# Patient Record
Sex: Female | Born: 1960 | Race: White | Hispanic: No | Marital: Married | Smoking: Former smoker
Health system: Southern US, Community
[De-identification: ages and names within clinical notes are randomized; demographics above are authoritative.]

## PROBLEM LIST (undated history)

## (undated) DIAGNOSIS — Z0000 Encounter for general adult medical examination without abnormal findings: Secondary | ICD-10-CM

## (undated) DIAGNOSIS — J45901 Unspecified asthma with (acute) exacerbation: Secondary | ICD-10-CM

## (undated) DIAGNOSIS — N200 Calculus of kidney: Secondary | ICD-10-CM

## (undated) DIAGNOSIS — J309 Allergic rhinitis, unspecified: Secondary | ICD-10-CM

## (undated) DIAGNOSIS — Z87442 Personal history of urinary calculi: Secondary | ICD-10-CM

## (undated) DIAGNOSIS — F17200 Nicotine dependence, unspecified, uncomplicated: Secondary | ICD-10-CM

## (undated) DIAGNOSIS — F411 Generalized anxiety disorder: Secondary | ICD-10-CM

## (undated) DIAGNOSIS — Z973 Presence of spectacles and contact lenses: Secondary | ICD-10-CM

## (undated) DIAGNOSIS — E785 Hyperlipidemia, unspecified: Secondary | ICD-10-CM

## (undated) DIAGNOSIS — R2689 Other abnormalities of gait and mobility: Secondary | ICD-10-CM

## (undated) DIAGNOSIS — J189 Pneumonia, unspecified organism: Secondary | ICD-10-CM

## (undated) DIAGNOSIS — Z1231 Encounter for screening mammogram for malignant neoplasm of breast: Secondary | ICD-10-CM

## (undated) DIAGNOSIS — G4730 Sleep apnea, unspecified: Secondary | ICD-10-CM

## (undated) DIAGNOSIS — M6281 Muscle weakness (generalized): Secondary | ICD-10-CM

## (undated) DIAGNOSIS — M1990 Unspecified osteoarthritis, unspecified site: Secondary | ICD-10-CM

## (undated) DIAGNOSIS — Z01419 Encounter for gynecological examination (general) (routine) without abnormal findings: Secondary | ICD-10-CM

## (undated) DIAGNOSIS — W19XXXD Unspecified fall, subsequent encounter: Secondary | ICD-10-CM

## (undated) DIAGNOSIS — F17210 Nicotine dependence, cigarettes, uncomplicated: Secondary | ICD-10-CM

## (undated) DIAGNOSIS — F3289 Other specified depressive episodes: Secondary | ICD-10-CM

## (undated) DIAGNOSIS — K588 Other irritable bowel syndrome: Secondary | ICD-10-CM

## (undated) DIAGNOSIS — K219 Gastro-esophageal reflux disease without esophagitis: Secondary | ICD-10-CM

## (undated) DIAGNOSIS — R262 Difficulty in walking, not elsewhere classified: Secondary | ICD-10-CM

## (undated) DIAGNOSIS — S72142A Displaced intertrochanteric fracture of left femur, initial encounter for closed fracture: Secondary | ICD-10-CM

## (undated) DIAGNOSIS — R011 Cardiac murmur, unspecified: Secondary | ICD-10-CM

## (undated) DIAGNOSIS — M549 Dorsalgia, unspecified: Secondary | ICD-10-CM

## (undated) DIAGNOSIS — G5601 Carpal tunnel syndrome, right upper limb: Secondary | ICD-10-CM

## (undated) DIAGNOSIS — M797 Fibromyalgia: Secondary | ICD-10-CM

## (undated) DIAGNOSIS — Z87448 Personal history of other diseases of urinary system: Secondary | ICD-10-CM

## (undated) DIAGNOSIS — D649 Anemia, unspecified: Secondary | ICD-10-CM

## (undated) DIAGNOSIS — R05 Cough: Secondary | ICD-10-CM

## (undated) DIAGNOSIS — N390 Urinary tract infection, site not specified: Secondary | ICD-10-CM

## (undated) DIAGNOSIS — N760 Acute vaginitis: Secondary | ICD-10-CM

## (undated) DIAGNOSIS — I10 Essential (primary) hypertension: Secondary | ICD-10-CM

## (undated) DIAGNOSIS — I509 Heart failure, unspecified: Secondary | ICD-10-CM

## (undated) DIAGNOSIS — E119 Type 2 diabetes mellitus without complications: Secondary | ICD-10-CM

## (undated) DIAGNOSIS — K5900 Constipation, unspecified: Secondary | ICD-10-CM

## (undated) DIAGNOSIS — R059 Cough, unspecified: Secondary | ICD-10-CM

## (undated) DIAGNOSIS — F329 Major depressive disorder, single episode, unspecified: Secondary | ICD-10-CM

## (undated) DIAGNOSIS — J449 Chronic obstructive pulmonary disease, unspecified: Secondary | ICD-10-CM

## (undated) DIAGNOSIS — R062 Wheezing: Secondary | ICD-10-CM

## (undated) DIAGNOSIS — L89159 Pressure ulcer of sacral region, unspecified stage: Secondary | ICD-10-CM

## (undated) DIAGNOSIS — G894 Chronic pain syndrome: Secondary | ICD-10-CM

## (undated) HISTORY — PX: BACK SURGERY: SHX140

## (undated) HISTORY — DX: Generalized anxiety disorder: F41.1

## (undated) HISTORY — DX: Hyperlipidemia, unspecified: E78.5

## (undated) HISTORY — DX: Nicotine dependence, unspecified, uncomplicated: F17.200

## (undated) HISTORY — DX: Encounter for gynecological examination (general) (routine) without abnormal findings: Z01.419

## (undated) HISTORY — DX: Sleep apnea, unspecified: G47.30

## (undated) HISTORY — DX: Fibromyalgia: M79.7

## (undated) HISTORY — DX: Dorsalgia, unspecified: M54.9

## (undated) HISTORY — DX: Unspecified asthma with (acute) exacerbation: J45.901

## (undated) HISTORY — DX: Type 2 diabetes mellitus without complications: E11.9

## (undated) HISTORY — PX: COLONOSCOPY: SHX174

## (undated) HISTORY — DX: Encounter for general adult medical examination without abnormal findings: Z00.00

## (undated) HISTORY — DX: Gastro-esophageal reflux disease without esophagitis: K21.9

## (undated) HISTORY — DX: Encounter for screening mammogram for malignant neoplasm of breast: Z12.31

## (undated) HISTORY — DX: Other specified depressive episodes: F32.89

## (undated) HISTORY — DX: Unspecified osteoarthritis, unspecified site: M19.90

## (undated) HISTORY — DX: Personal history of other diseases of urinary system: Z87.448

## (undated) HISTORY — DX: Calculus of kidney: N20.0

## (undated) HISTORY — DX: Allergic rhinitis, unspecified: J30.9

## (undated) HISTORY — DX: Major depressive disorder, single episode, unspecified: F32.9

## (undated) HISTORY — DX: Cardiac murmur, unspecified: R01.1

## (undated) HISTORY — DX: Essential (primary) hypertension: I10

---
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
## 1998-01-21 ENCOUNTER — Inpatient Hospital Stay (HOSPITAL_COMMUNITY): Admission: AD | Admit: 1998-01-21 | Discharge: 1998-01-21 | Payer: Self-pay | Admitting: Obstetrics & Gynecology

## 1998-02-15 ENCOUNTER — Other Ambulatory Visit: Admission: RE | Admit: 1998-02-15 | Discharge: 1998-02-15 | Payer: Self-pay | Admitting: Obstetrics & Gynecology

## 1998-02-15 ENCOUNTER — Encounter: Admission: RE | Admit: 1998-02-15 | Discharge: 1998-02-15 | Payer: Self-pay | Admitting: Obstetrics & Gynecology

## 1998-03-04 ENCOUNTER — Encounter: Admission: RE | Admit: 1998-03-04 | Discharge: 1998-03-04 | Payer: Self-pay | Admitting: Obstetrics & Gynecology

## 1998-05-17 ENCOUNTER — Emergency Department (HOSPITAL_COMMUNITY): Admission: EM | Admit: 1998-05-17 | Discharge: 1998-05-18 | Payer: Self-pay | Admitting: Internal Medicine

## 1998-06-02 ENCOUNTER — Encounter: Admission: RE | Admit: 1998-06-02 | Discharge: 1998-06-02 | Payer: Self-pay | Admitting: Internal Medicine

## 1998-06-07 ENCOUNTER — Encounter: Admission: RE | Admit: 1998-06-07 | Discharge: 1998-06-07 | Payer: Self-pay | Admitting: Hematology and Oncology

## 1998-07-06 ENCOUNTER — Encounter: Admission: RE | Admit: 1998-07-06 | Discharge: 1998-07-06 | Payer: Self-pay | Admitting: Internal Medicine

## 1998-08-09 ENCOUNTER — Encounter: Admission: RE | Admit: 1998-08-09 | Discharge: 1998-08-09 | Payer: Self-pay | Admitting: Internal Medicine

## 1998-08-09 ENCOUNTER — Ambulatory Visit (HOSPITAL_COMMUNITY): Admission: RE | Admit: 1998-08-09 | Discharge: 1998-08-09 | Payer: Self-pay | Admitting: Internal Medicine

## 1998-11-06 ENCOUNTER — Emergency Department (HOSPITAL_COMMUNITY): Admission: EM | Admit: 1998-11-06 | Discharge: 1998-11-06 | Payer: Self-pay

## 1998-12-27 ENCOUNTER — Encounter: Admission: RE | Admit: 1998-12-27 | Discharge: 1998-12-27 | Payer: Self-pay | Admitting: Internal Medicine

## 1999-01-07 ENCOUNTER — Emergency Department (HOSPITAL_COMMUNITY): Admission: EM | Admit: 1999-01-07 | Discharge: 1999-01-07 | Payer: Self-pay

## 1999-01-13 ENCOUNTER — Ambulatory Visit (HOSPITAL_COMMUNITY): Admission: RE | Admit: 1999-01-13 | Discharge: 1999-01-13 | Payer: Self-pay | Admitting: Emergency Medicine

## 1999-01-13 ENCOUNTER — Encounter: Payer: Self-pay | Admitting: Emergency Medicine

## 1999-01-16 ENCOUNTER — Emergency Department (HOSPITAL_COMMUNITY): Admission: EM | Admit: 1999-01-16 | Discharge: 1999-01-17 | Payer: Self-pay | Admitting: Emergency Medicine

## 1999-02-07 ENCOUNTER — Encounter: Payer: Self-pay | Admitting: Emergency Medicine

## 1999-02-07 ENCOUNTER — Emergency Department (HOSPITAL_COMMUNITY): Admission: EM | Admit: 1999-02-07 | Discharge: 1999-02-07 | Payer: Self-pay | Admitting: Emergency Medicine

## 1999-02-17 ENCOUNTER — Encounter: Admission: RE | Admit: 1999-02-17 | Discharge: 1999-02-17 | Payer: Self-pay | Admitting: Internal Medicine

## 1999-04-04 ENCOUNTER — Encounter: Admission: RE | Admit: 1999-04-04 | Discharge: 1999-04-04 | Payer: Self-pay | Admitting: Internal Medicine

## 1999-07-12 ENCOUNTER — Inpatient Hospital Stay (HOSPITAL_COMMUNITY): Admission: AD | Admit: 1999-07-12 | Discharge: 1999-07-12 | Payer: Self-pay | Admitting: Obstetrics & Gynecology

## 1999-07-14 ENCOUNTER — Encounter: Payer: Self-pay | Admitting: Obstetrics & Gynecology

## 1999-07-14 ENCOUNTER — Observation Stay (HOSPITAL_COMMUNITY): Admission: AD | Admit: 1999-07-14 | Discharge: 1999-07-15 | Payer: Self-pay | Admitting: Obstetrics & Gynecology

## 1999-07-17 ENCOUNTER — Inpatient Hospital Stay (HOSPITAL_COMMUNITY): Admission: AD | Admit: 1999-07-17 | Discharge: 1999-07-17 | Payer: Self-pay | Admitting: *Deleted

## 1999-07-22 ENCOUNTER — Inpatient Hospital Stay (HOSPITAL_COMMUNITY): Admission: AD | Admit: 1999-07-22 | Discharge: 1999-07-22 | Payer: Self-pay | Admitting: *Deleted

## 1999-08-13 ENCOUNTER — Inpatient Hospital Stay (HOSPITAL_COMMUNITY): Admission: AD | Admit: 1999-08-13 | Discharge: 1999-08-13 | Payer: Self-pay | Admitting: Obstetrics & Gynecology

## 1999-09-30 ENCOUNTER — Emergency Department (HOSPITAL_COMMUNITY): Admission: EM | Admit: 1999-09-30 | Discharge: 1999-09-30 | Payer: Self-pay | Admitting: Emergency Medicine

## 1999-10-26 ENCOUNTER — Encounter: Admission: RE | Admit: 1999-10-26 | Discharge: 1999-10-26 | Payer: Self-pay | Admitting: Internal Medicine

## 1999-11-19 ENCOUNTER — Emergency Department (HOSPITAL_COMMUNITY): Admission: EM | Admit: 1999-11-19 | Discharge: 1999-11-19 | Payer: Self-pay | Admitting: Emergency Medicine

## 1999-12-25 ENCOUNTER — Emergency Department (HOSPITAL_COMMUNITY): Admission: EM | Admit: 1999-12-25 | Discharge: 1999-12-25 | Payer: Self-pay | Admitting: Emergency Medicine

## 2000-01-04 ENCOUNTER — Encounter: Admission: RE | Admit: 2000-01-04 | Discharge: 2000-01-04 | Payer: Self-pay | Admitting: Hematology and Oncology

## 2000-07-04 ENCOUNTER — Encounter: Payer: Self-pay | Admitting: Family Medicine

## 2000-07-04 ENCOUNTER — Encounter: Admission: RE | Admit: 2000-07-04 | Discharge: 2000-07-04 | Payer: Self-pay | Admitting: *Deleted

## 2000-10-04 ENCOUNTER — Encounter: Admission: RE | Admit: 2000-10-04 | Discharge: 2000-10-04 | Payer: Self-pay | Admitting: Obstetrics & Gynecology

## 2000-10-16 ENCOUNTER — Encounter: Admission: RE | Admit: 2000-10-16 | Discharge: 2001-01-14 | Payer: Self-pay | Admitting: Family Medicine

## 2000-12-20 ENCOUNTER — Encounter: Admission: RE | Admit: 2000-12-20 | Discharge: 2000-12-20 | Payer: Self-pay

## 2001-01-09 ENCOUNTER — Encounter: Admission: RE | Admit: 2001-01-09 | Discharge: 2001-04-09 | Payer: Self-pay

## 2001-01-10 ENCOUNTER — Encounter: Admission: RE | Admit: 2001-01-10 | Discharge: 2001-01-10 | Payer: Self-pay | Admitting: Internal Medicine

## 2001-01-15 ENCOUNTER — Encounter: Admission: RE | Admit: 2001-01-15 | Discharge: 2001-01-30 | Payer: Self-pay | Admitting: Family Medicine

## 2001-01-20 ENCOUNTER — Encounter: Admission: RE | Admit: 2001-01-20 | Discharge: 2001-01-20 | Payer: Self-pay | Admitting: Internal Medicine

## 2001-02-18 ENCOUNTER — Encounter: Admission: RE | Admit: 2001-02-18 | Discharge: 2001-02-18 | Payer: Self-pay | Admitting: Hematology and Oncology

## 2001-03-20 ENCOUNTER — Encounter: Admission: RE | Admit: 2001-03-20 | Discharge: 2001-03-20 | Payer: Self-pay | Admitting: Obstetrics

## 2001-07-02 ENCOUNTER — Encounter: Admission: RE | Admit: 2001-07-02 | Discharge: 2001-07-02 | Payer: Self-pay | Admitting: Internal Medicine

## 2001-07-23 ENCOUNTER — Encounter: Admission: RE | Admit: 2001-07-23 | Discharge: 2001-07-23 | Payer: Self-pay | Admitting: Internal Medicine

## 2001-09-01 ENCOUNTER — Encounter: Admission: RE | Admit: 2001-09-01 | Discharge: 2001-09-01 | Payer: Self-pay | Admitting: Internal Medicine

## 2001-09-30 ENCOUNTER — Encounter: Admission: RE | Admit: 2001-09-30 | Discharge: 2001-09-30 | Payer: Self-pay | Admitting: Internal Medicine

## 2002-01-05 ENCOUNTER — Encounter: Admission: RE | Admit: 2002-01-05 | Discharge: 2002-01-05 | Payer: Self-pay | Admitting: Internal Medicine

## 2002-01-23 ENCOUNTER — Encounter: Admission: RE | Admit: 2002-01-23 | Discharge: 2002-01-23 | Payer: Self-pay | Admitting: Internal Medicine

## 2002-06-10 ENCOUNTER — Encounter: Admission: RE | Admit: 2002-06-10 | Discharge: 2002-06-10 | Payer: Self-pay | Admitting: Internal Medicine

## 2002-06-30 ENCOUNTER — Encounter: Admission: RE | Admit: 2002-06-30 | Discharge: 2002-06-30 | Payer: Self-pay | Admitting: *Deleted

## 2002-07-16 ENCOUNTER — Ambulatory Visit (HOSPITAL_COMMUNITY): Admission: RE | Admit: 2002-07-16 | Discharge: 2002-07-16 | Payer: Self-pay | Admitting: *Deleted

## 2002-07-20 ENCOUNTER — Encounter: Admission: RE | Admit: 2002-07-20 | Discharge: 2002-07-20 | Payer: Self-pay | Admitting: Internal Medicine

## 2002-07-23 ENCOUNTER — Encounter: Admission: RE | Admit: 2002-07-23 | Discharge: 2002-07-23 | Payer: Self-pay | Admitting: Internal Medicine

## 2002-08-04 ENCOUNTER — Encounter: Admission: RE | Admit: 2002-08-04 | Discharge: 2002-08-04 | Payer: Self-pay | Admitting: Internal Medicine

## 2002-10-07 ENCOUNTER — Encounter: Admission: RE | Admit: 2002-10-07 | Discharge: 2002-10-07 | Payer: Self-pay | Admitting: Internal Medicine

## 2002-11-09 ENCOUNTER — Encounter: Admission: RE | Admit: 2002-11-09 | Discharge: 2002-11-09 | Payer: Self-pay | Admitting: Internal Medicine

## 2003-02-22 ENCOUNTER — Encounter: Admission: RE | Admit: 2003-02-22 | Discharge: 2003-02-22 | Payer: Self-pay | Admitting: Internal Medicine

## 2003-02-25 ENCOUNTER — Encounter: Admission: RE | Admit: 2003-02-25 | Discharge: 2003-02-25 | Payer: Self-pay | Admitting: Internal Medicine

## 2003-03-08 ENCOUNTER — Encounter: Admission: RE | Admit: 2003-03-08 | Discharge: 2003-03-08 | Payer: Self-pay | Admitting: Internal Medicine

## 2003-03-15 ENCOUNTER — Encounter: Admission: RE | Admit: 2003-03-15 | Discharge: 2003-03-15 | Payer: Self-pay | Admitting: Internal Medicine

## 2003-05-19 ENCOUNTER — Encounter: Admission: RE | Admit: 2003-05-19 | Discharge: 2003-05-19 | Payer: Self-pay | Admitting: Internal Medicine

## 2003-07-05 ENCOUNTER — Encounter: Admission: RE | Admit: 2003-07-05 | Discharge: 2003-07-05 | Payer: Self-pay | Admitting: Internal Medicine

## 2003-08-20 ENCOUNTER — Encounter: Admission: RE | Admit: 2003-08-20 | Discharge: 2003-08-20 | Payer: Self-pay | Admitting: Internal Medicine

## 2003-09-09 ENCOUNTER — Encounter: Admission: RE | Admit: 2003-09-09 | Discharge: 2003-09-09 | Payer: Self-pay | Admitting: Internal Medicine

## 2003-09-23 ENCOUNTER — Encounter: Admission: RE | Admit: 2003-09-23 | Discharge: 2003-09-23 | Payer: Self-pay | Admitting: Internal Medicine

## 2003-11-15 ENCOUNTER — Encounter: Admission: RE | Admit: 2003-11-15 | Discharge: 2003-11-15 | Payer: Self-pay | Admitting: Internal Medicine

## 2003-12-06 ENCOUNTER — Encounter: Admission: RE | Admit: 2003-12-06 | Discharge: 2003-12-06 | Payer: Self-pay | Admitting: Internal Medicine

## 2004-01-14 ENCOUNTER — Encounter: Admission: RE | Admit: 2004-01-14 | Discharge: 2004-01-14 | Payer: Self-pay | Admitting: Internal Medicine

## 2004-02-23 ENCOUNTER — Encounter: Admission: RE | Admit: 2004-02-23 | Discharge: 2004-02-23 | Payer: Self-pay | Admitting: Internal Medicine

## 2004-04-06 ENCOUNTER — Ambulatory Visit (HOSPITAL_COMMUNITY): Admission: RE | Admit: 2004-04-06 | Discharge: 2004-04-06 | Payer: Self-pay | Admitting: Internal Medicine

## 2004-04-06 ENCOUNTER — Encounter: Admission: RE | Admit: 2004-04-06 | Discharge: 2004-04-06 | Payer: Self-pay | Admitting: Internal Medicine

## 2004-04-18 ENCOUNTER — Encounter (INDEPENDENT_AMBULATORY_CARE_PROVIDER_SITE_OTHER): Payer: Self-pay | Admitting: *Deleted

## 2004-04-18 ENCOUNTER — Encounter: Admission: RE | Admit: 2004-04-18 | Discharge: 2004-04-18 | Payer: Self-pay | Admitting: Obstetrics and Gynecology

## 2004-04-21 ENCOUNTER — Ambulatory Visit (HOSPITAL_COMMUNITY): Admission: RE | Admit: 2004-04-21 | Discharge: 2004-04-21 | Payer: Self-pay | Admitting: *Deleted

## 2004-05-01 ENCOUNTER — Encounter: Admission: RE | Admit: 2004-05-01 | Discharge: 2004-05-01 | Payer: Self-pay | Admitting: Internal Medicine

## 2004-05-04 ENCOUNTER — Ambulatory Visit (HOSPITAL_COMMUNITY): Admission: RE | Admit: 2004-05-04 | Discharge: 2004-05-04 | Payer: Self-pay | Admitting: Internal Medicine

## 2004-05-15 ENCOUNTER — Encounter (INDEPENDENT_AMBULATORY_CARE_PROVIDER_SITE_OTHER): Payer: Self-pay | Admitting: Specialist

## 2004-05-15 ENCOUNTER — Ambulatory Visit (HOSPITAL_COMMUNITY): Admission: RE | Admit: 2004-05-15 | Discharge: 2004-05-15 | Payer: Self-pay | Admitting: Obstetrics and Gynecology

## 2004-06-12 ENCOUNTER — Encounter: Admission: RE | Admit: 2004-06-12 | Discharge: 2004-06-12 | Payer: Self-pay | Admitting: Internal Medicine

## 2004-12-27 ENCOUNTER — Ambulatory Visit: Payer: Self-pay | Admitting: Internal Medicine

## 2005-02-23 ENCOUNTER — Ambulatory Visit: Payer: Self-pay | Admitting: Internal Medicine

## 2005-03-05 ENCOUNTER — Ambulatory Visit: Payer: Self-pay | Admitting: Internal Medicine

## 2005-04-09 ENCOUNTER — Ambulatory Visit: Payer: Self-pay | Admitting: Internal Medicine

## 2005-05-23 ENCOUNTER — Ambulatory Visit: Payer: Self-pay | Admitting: Internal Medicine

## 2005-07-11 ENCOUNTER — Ambulatory Visit: Payer: Self-pay | Admitting: Internal Medicine

## 2005-08-09 ENCOUNTER — Ambulatory Visit: Payer: Self-pay | Admitting: Hospitalist

## 2005-10-01 ENCOUNTER — Ambulatory Visit: Payer: Self-pay | Admitting: Internal Medicine

## 2005-10-16 ENCOUNTER — Ambulatory Visit: Payer: Self-pay | Admitting: Internal Medicine

## 2005-10-25 ENCOUNTER — Ambulatory Visit (HOSPITAL_COMMUNITY): Admission: RE | Admit: 2005-10-25 | Discharge: 2005-10-25 | Payer: Self-pay | Admitting: Internal Medicine

## 2005-11-08 ENCOUNTER — Ambulatory Visit: Payer: Self-pay | Admitting: Internal Medicine

## 2006-03-13 ENCOUNTER — Ambulatory Visit: Payer: Self-pay | Admitting: Internal Medicine

## 2006-03-26 ENCOUNTER — Ambulatory Visit: Payer: Self-pay | Admitting: Internal Medicine

## 2006-05-21 ENCOUNTER — Ambulatory Visit: Payer: Self-pay | Admitting: Internal Medicine

## 2006-06-04 ENCOUNTER — Ambulatory Visit: Payer: Self-pay | Admitting: Hospitalist

## 2006-07-06 ENCOUNTER — Encounter: Payer: Self-pay | Admitting: Family Medicine

## 2006-07-06 LAB — CONVERTED CEMR LAB: Pap Smear: NORMAL

## 2006-07-16 ENCOUNTER — Encounter (INDEPENDENT_AMBULATORY_CARE_PROVIDER_SITE_OTHER): Payer: Self-pay | Admitting: *Deleted

## 2006-07-16 ENCOUNTER — Ambulatory Visit: Payer: Self-pay | Admitting: Internal Medicine

## 2006-07-19 ENCOUNTER — Ambulatory Visit: Payer: Self-pay | Admitting: Internal Medicine

## 2006-08-05 ENCOUNTER — Encounter: Payer: Self-pay | Admitting: Family Medicine

## 2006-08-05 HISTORY — PX: CHOLECYSTECTOMY: SHX55

## 2006-08-05 LAB — CONVERTED CEMR LAB: Microalbumin U total vol: 0.7 mg/L

## 2006-08-06 ENCOUNTER — Ambulatory Visit: Payer: Self-pay | Admitting: Internal Medicine

## 2006-08-09 ENCOUNTER — Encounter (INDEPENDENT_AMBULATORY_CARE_PROVIDER_SITE_OTHER): Payer: Self-pay | Admitting: Specialist

## 2006-08-09 ENCOUNTER — Ambulatory Visit (HOSPITAL_COMMUNITY): Admission: RE | Admit: 2006-08-09 | Discharge: 2006-08-09 | Payer: Self-pay | Admitting: General Surgery

## 2006-08-26 ENCOUNTER — Ambulatory Visit: Payer: Self-pay | Admitting: Internal Medicine

## 2006-09-05 ENCOUNTER — Ambulatory Visit: Payer: Self-pay | Admitting: Internal Medicine

## 2006-09-05 ENCOUNTER — Ambulatory Visit (HOSPITAL_COMMUNITY): Admission: RE | Admit: 2006-09-05 | Discharge: 2006-09-05 | Payer: Self-pay | Admitting: Internal Medicine

## 2006-09-10 ENCOUNTER — Ambulatory Visit: Payer: Self-pay | Admitting: Internal Medicine

## 2006-10-08 ENCOUNTER — Ambulatory Visit: Payer: Self-pay | Admitting: Internal Medicine

## 2006-10-08 LAB — CONVERTED CEMR LAB
BUN: 14 mg/dL (ref 6–23)
Calcium: 9.3 mg/dL (ref 8.4–10.5)
Chloride: 101 meq/L (ref 96–112)
Cholesterol: 129 mg/dL (ref 0–200)
Glucose, Bld: 241 mg/dL — ABNORMAL HIGH (ref 70–99)
LDL Cholesterol: 72 mg/dL (ref 0–99)
Potassium: 4.2 meq/L (ref 3.5–5.1)
Sodium: 136 meq/L (ref 135–145)
Triglyceride fasting, serum: 105 mg/dL (ref 0–149)

## 2006-10-11 ENCOUNTER — Ambulatory Visit: Payer: Self-pay | Admitting: Endocrinology

## 2006-11-22 ENCOUNTER — Ambulatory Visit: Payer: Self-pay | Admitting: Internal Medicine

## 2006-11-26 ENCOUNTER — Ambulatory Visit: Payer: Self-pay | Admitting: Family Medicine

## 2006-12-06 ENCOUNTER — Encounter: Payer: Self-pay | Admitting: Family Medicine

## 2006-12-06 LAB — CONVERTED CEMR LAB: Hgb A1c MFr Bld: 9.2 %

## 2006-12-11 ENCOUNTER — Encounter: Payer: Self-pay | Admitting: Family Medicine

## 2006-12-13 ENCOUNTER — Ambulatory Visit: Payer: Self-pay | Admitting: Family Medicine

## 2006-12-16 ENCOUNTER — Ambulatory Visit: Payer: Self-pay | Admitting: Family Medicine

## 2006-12-31 ENCOUNTER — Ambulatory Visit: Payer: Self-pay | Admitting: Family Medicine

## 2007-03-12 ENCOUNTER — Ambulatory Visit: Payer: Self-pay | Admitting: Family Medicine

## 2007-03-12 DIAGNOSIS — Z87891 Personal history of nicotine dependence: Secondary | ICD-10-CM

## 2007-03-12 DIAGNOSIS — G4730 Sleep apnea, unspecified: Secondary | ICD-10-CM

## 2007-03-12 DIAGNOSIS — M797 Fibromyalgia: Secondary | ICD-10-CM

## 2007-03-12 DIAGNOSIS — F339 Major depressive disorder, recurrent, unspecified: Secondary | ICD-10-CM

## 2007-03-12 DIAGNOSIS — K219 Gastro-esophageal reflux disease without esophagitis: Secondary | ICD-10-CM

## 2007-03-12 DIAGNOSIS — E785 Hyperlipidemia, unspecified: Secondary | ICD-10-CM

## 2007-03-26 ENCOUNTER — Telehealth: Payer: Self-pay | Admitting: Family Medicine

## 2007-04-01 ENCOUNTER — Telehealth: Payer: Self-pay | Admitting: Family Medicine

## 2007-04-03 ENCOUNTER — Ambulatory Visit: Payer: Self-pay | Admitting: Family Medicine

## 2007-04-03 DIAGNOSIS — F411 Generalized anxiety disorder: Secondary | ICD-10-CM | POA: Insufficient documentation

## 2007-04-07 ENCOUNTER — Ambulatory Visit: Payer: Self-pay | Admitting: Family Medicine

## 2007-04-07 LAB — CONVERTED CEMR LAB
Albumin: 3.3 g/dL — ABNORMAL LOW (ref 3.5–5.2)
Alkaline Phosphatase: 155 units/L — ABNORMAL HIGH (ref 39–117)
Bilirubin, Direct: 0.1 mg/dL (ref 0.0–0.3)
CO2: 30 meq/L (ref 19–32)
Calcium: 9.2 mg/dL (ref 8.4–10.5)
Creatinine, Ser: 0.7 mg/dL (ref 0.4–1.2)
GFR calc non Af Amer: 96 mL/min
Glucose, Bld: 96 mg/dL (ref 70–99)
Hgb A1c MFr Bld: 6.9 % — ABNORMAL HIGH (ref 4.6–6.0)
LDL Cholesterol: 98 mg/dL (ref 0–99)
Total Bilirubin: 0.4 mg/dL (ref 0.3–1.2)
Total CHOL/HDL Ratio: 4.1
Total Protein: 7.2 g/dL (ref 6.0–8.3)
VLDL: 13 mg/dL (ref 0–40)

## 2007-05-20 ENCOUNTER — Telehealth (INDEPENDENT_AMBULATORY_CARE_PROVIDER_SITE_OTHER): Payer: Self-pay | Admitting: *Deleted

## 2007-05-30 ENCOUNTER — Telehealth (INDEPENDENT_AMBULATORY_CARE_PROVIDER_SITE_OTHER): Payer: Self-pay | Admitting: *Deleted

## 2007-06-24 ENCOUNTER — Encounter: Payer: Self-pay | Admitting: Family Medicine

## 2007-06-24 DIAGNOSIS — E119 Type 2 diabetes mellitus without complications: Secondary | ICD-10-CM

## 2007-06-24 DIAGNOSIS — J309 Allergic rhinitis, unspecified: Secondary | ICD-10-CM

## 2007-06-24 DIAGNOSIS — N200 Calculus of kidney: Secondary | ICD-10-CM | POA: Insufficient documentation

## 2007-06-24 DIAGNOSIS — M1990 Unspecified osteoarthritis, unspecified site: Secondary | ICD-10-CM | POA: Insufficient documentation

## 2007-06-30 ENCOUNTER — Encounter (INDEPENDENT_AMBULATORY_CARE_PROVIDER_SITE_OTHER): Payer: Self-pay | Admitting: *Deleted

## 2007-07-08 ENCOUNTER — Ambulatory Visit: Payer: Self-pay | Admitting: Family Medicine

## 2007-07-08 LAB — CONVERTED CEMR LAB
Creatinine,U: 60.6 mg/dL
Hgb A1c MFr Bld: 6.8 % — ABNORMAL HIGH (ref 4.6–6.0)

## 2007-07-14 ENCOUNTER — Telehealth: Payer: Self-pay | Admitting: Family Medicine

## 2007-07-15 ENCOUNTER — Ambulatory Visit: Payer: Self-pay | Admitting: Family Medicine

## 2007-07-15 LAB — CONVERTED CEMR LAB
HDL goal, serum: 40 mg/dL
Ketones, urine, test strip: NEGATIVE
Nitrite: NEGATIVE
Specific Gravity, Urine: 1.015
Urobilinogen, UA: NEGATIVE
WBC Urine, dipstick: NEGATIVE
pH: 6

## 2007-07-23 ENCOUNTER — Telehealth: Payer: Self-pay | Admitting: Family Medicine

## 2007-07-30 ENCOUNTER — Telehealth: Payer: Self-pay | Admitting: Family Medicine

## 2007-08-28 ENCOUNTER — Telehealth: Payer: Self-pay | Admitting: Family Medicine

## 2007-09-08 ENCOUNTER — Telehealth: Payer: Self-pay | Admitting: Family Medicine

## 2007-09-09 ENCOUNTER — Telehealth: Payer: Self-pay | Admitting: Family Medicine

## 2007-09-19 ENCOUNTER — Ambulatory Visit: Payer: Self-pay | Admitting: Family Medicine

## 2007-10-14 ENCOUNTER — Ambulatory Visit: Payer: Self-pay | Admitting: Family Medicine

## 2007-10-14 LAB — CONVERTED CEMR LAB
BUN: 12 mg/dL (ref 6–23)
Creatinine, Ser: 0.8 mg/dL (ref 0.4–1.2)
Glucose, Bld: 199 mg/dL — ABNORMAL HIGH (ref 70–99)
Hgb A1c MFr Bld: 8.8 % — ABNORMAL HIGH (ref 4.6–6.0)
Potassium: 4.5 meq/L (ref 3.5–5.1)
Sodium: 131 meq/L — ABNORMAL LOW (ref 135–145)

## 2007-10-20 ENCOUNTER — Telehealth (INDEPENDENT_AMBULATORY_CARE_PROVIDER_SITE_OTHER): Payer: Self-pay | Admitting: *Deleted

## 2007-10-21 ENCOUNTER — Telehealth: Payer: Self-pay | Admitting: Family Medicine

## 2007-10-23 ENCOUNTER — Ambulatory Visit: Payer: Self-pay | Admitting: Family Medicine

## 2007-11-07 ENCOUNTER — Telehealth: Payer: Self-pay | Admitting: Family Medicine

## 2007-11-11 ENCOUNTER — Telehealth: Payer: Self-pay | Admitting: Family Medicine

## 2007-11-21 ENCOUNTER — Telehealth: Payer: Self-pay | Admitting: Family Medicine

## 2007-12-11 ENCOUNTER — Telehealth: Payer: Self-pay | Admitting: Family Medicine

## 2007-12-23 ENCOUNTER — Telehealth: Payer: Self-pay | Admitting: Family Medicine

## 2007-12-31 ENCOUNTER — Ambulatory Visit: Payer: Self-pay | Admitting: Family Medicine

## 2007-12-31 LAB — CONVERTED CEMR LAB: KOH Prep: POSITIVE

## 2008-01-07 ENCOUNTER — Telehealth: Payer: Self-pay | Admitting: Family Medicine

## 2008-01-19 ENCOUNTER — Telehealth: Payer: Self-pay | Admitting: Family Medicine

## 2008-02-02 ENCOUNTER — Encounter (INDEPENDENT_AMBULATORY_CARE_PROVIDER_SITE_OTHER): Payer: Self-pay | Admitting: *Deleted

## 2008-02-06 ENCOUNTER — Telehealth: Payer: Self-pay | Admitting: Family Medicine

## 2008-02-18 ENCOUNTER — Telehealth: Payer: Self-pay | Admitting: Family Medicine

## 2008-02-24 ENCOUNTER — Ambulatory Visit: Payer: Self-pay | Admitting: Family Medicine

## 2008-03-02 ENCOUNTER — Ambulatory Visit: Payer: Self-pay | Admitting: Family Medicine

## 2008-03-05 ENCOUNTER — Encounter (INDEPENDENT_AMBULATORY_CARE_PROVIDER_SITE_OTHER): Payer: Self-pay | Admitting: *Deleted

## 2008-03-05 LAB — CONVERTED CEMR LAB
ALT: 25 units/L (ref 0–35)
Alkaline Phosphatase: 166 units/L — ABNORMAL HIGH (ref 39–117)
BUN: 11 mg/dL (ref 6–23)
Bilirubin, Direct: 0.1 mg/dL (ref 0.0–0.3)
Calcium: 9.6 mg/dL (ref 8.4–10.5)
GFR calc non Af Amer: 95 mL/min
HDL: 29.3 mg/dL — ABNORMAL LOW (ref >39.0)
Hgb A1c MFr Bld: 9.2 % — ABNORMAL HIGH (ref 4.6–6.0)
LDL Cholesterol: 78 mg/dL (ref 0–99)
Sodium: 137 meq/L (ref 135–145)
Total Bilirubin: 0.3 mg/dL (ref 0.3–1.2)
Triglycerides: 146 mg/dL (ref 0–149)
VLDL: 29 mg/dL (ref 0–40)

## 2008-03-08 ENCOUNTER — Telehealth: Payer: Self-pay | Admitting: Family Medicine

## 2008-03-10 ENCOUNTER — Telehealth: Payer: Self-pay | Admitting: Family Medicine

## 2008-03-15 ENCOUNTER — Telehealth: Payer: Self-pay | Admitting: Family Medicine

## 2008-03-25 ENCOUNTER — Encounter: Payer: Self-pay | Admitting: Family Medicine

## 2008-04-05 ENCOUNTER — Telehealth: Payer: Self-pay | Admitting: Family Medicine

## 2008-04-12 ENCOUNTER — Telehealth: Payer: Self-pay | Admitting: Family Medicine

## 2008-05-04 ENCOUNTER — Telehealth: Payer: Self-pay | Admitting: Family Medicine

## 2008-05-10 ENCOUNTER — Telehealth: Payer: Self-pay | Admitting: Family Medicine

## 2008-05-27 ENCOUNTER — Ambulatory Visit: Payer: Self-pay | Admitting: Family Medicine

## 2008-05-31 ENCOUNTER — Telehealth: Payer: Self-pay | Admitting: Family Medicine

## 2008-05-31 ENCOUNTER — Ambulatory Visit: Payer: Self-pay | Admitting: Family Medicine

## 2008-06-08 ENCOUNTER — Telehealth: Payer: Self-pay | Admitting: Family Medicine

## 2008-06-28 ENCOUNTER — Telehealth: Payer: Self-pay | Admitting: Family Medicine

## 2008-07-06 ENCOUNTER — Telehealth: Payer: Self-pay | Admitting: Family Medicine

## 2008-07-21 ENCOUNTER — Ambulatory Visit: Payer: Self-pay | Admitting: Family Medicine

## 2008-07-21 ENCOUNTER — Encounter: Payer: Self-pay | Admitting: Family Medicine

## 2008-07-21 LAB — CONVERTED CEMR LAB
Protein, U semiquant: NEGATIVE
Specific Gravity, Urine: 1.01
Urobilinogen, UA: 0.2

## 2008-07-26 ENCOUNTER — Telehealth: Payer: Self-pay | Admitting: Family Medicine

## 2008-08-03 ENCOUNTER — Telehealth: Payer: Self-pay | Admitting: Family Medicine

## 2008-08-23 ENCOUNTER — Telehealth (INDEPENDENT_AMBULATORY_CARE_PROVIDER_SITE_OTHER): Payer: Self-pay | Admitting: *Deleted

## 2008-08-27 ENCOUNTER — Encounter (INDEPENDENT_AMBULATORY_CARE_PROVIDER_SITE_OTHER): Payer: Self-pay | Admitting: *Deleted

## 2008-08-30 ENCOUNTER — Telehealth: Payer: Self-pay | Admitting: Family Medicine

## 2008-09-06 ENCOUNTER — Ambulatory Visit: Payer: Self-pay | Admitting: Family Medicine

## 2008-09-08 ENCOUNTER — Ambulatory Visit: Payer: Self-pay | Admitting: Family Medicine

## 2008-09-15 LAB — CONVERTED CEMR LAB
ALT: 23 units/L (ref 0–35)
AST: 29 units/L (ref 0–37)
Albumin: 3.6 g/dL (ref 3.5–5.2)
BUN: 13 mg/dL (ref 6–23)
Chloride: 103 meq/L (ref 96–112)
GFR calc non Af Amer: 113 mL/min
Microalb Creat Ratio: 22.6 mg/g (ref 0.0–30.0)
Microalb, Ur: 0.7 mg/dL (ref 0.0–1.9)
Potassium: 4.2 meq/L (ref 3.5–5.1)
Sodium: 138 meq/L (ref 135–145)
Total CHOL/HDL Ratio: 3.7
Triglycerides: 61 mg/dL (ref 0–149)
VLDL: 12 mg/dL (ref 0–40)

## 2008-09-20 ENCOUNTER — Telehealth: Payer: Self-pay | Admitting: Family Medicine

## 2008-09-27 ENCOUNTER — Telehealth: Payer: Self-pay | Admitting: Family Medicine

## 2008-10-01 ENCOUNTER — Ambulatory Visit: Payer: Self-pay | Admitting: Family Medicine

## 2008-10-01 ENCOUNTER — Telehealth: Payer: Self-pay | Admitting: Family Medicine

## 2008-10-04 ENCOUNTER — Telehealth (INDEPENDENT_AMBULATORY_CARE_PROVIDER_SITE_OTHER): Payer: Self-pay | Admitting: *Deleted

## 2008-10-05 ENCOUNTER — Ambulatory Visit: Payer: Self-pay | Admitting: Family Medicine

## 2008-10-05 LAB — CONVERTED CEMR LAB: Hgb A1c MFr Bld: 6.7 % — ABNORMAL HIGH (ref 4.6–6.0)

## 2008-10-07 ENCOUNTER — Telehealth: Payer: Self-pay | Admitting: Family Medicine

## 2008-10-12 ENCOUNTER — Telehealth: Payer: Self-pay | Admitting: Family Medicine

## 2008-10-18 ENCOUNTER — Telehealth: Payer: Self-pay | Admitting: Family Medicine

## 2008-10-21 ENCOUNTER — Telehealth: Payer: Self-pay | Admitting: Family Medicine

## 2008-10-25 ENCOUNTER — Telehealth: Payer: Self-pay | Admitting: Family Medicine

## 2008-10-28 ENCOUNTER — Telehealth: Payer: Self-pay | Admitting: Family Medicine

## 2008-11-21 ENCOUNTER — Encounter: Payer: Self-pay | Admitting: Family Medicine

## 2008-11-22 ENCOUNTER — Ambulatory Visit: Payer: Self-pay | Admitting: Family Medicine

## 2008-11-23 ENCOUNTER — Telehealth: Payer: Self-pay | Admitting: Family Medicine

## 2008-11-29 ENCOUNTER — Telehealth (INDEPENDENT_AMBULATORY_CARE_PROVIDER_SITE_OTHER): Payer: Self-pay | Admitting: *Deleted

## 2008-12-07 ENCOUNTER — Ambulatory Visit: Payer: Self-pay | Admitting: Family Medicine

## 2008-12-14 ENCOUNTER — Ambulatory Visit: Payer: Self-pay | Admitting: Family Medicine

## 2008-12-17 ENCOUNTER — Telehealth: Payer: Self-pay | Admitting: Family Medicine

## 2008-12-21 ENCOUNTER — Telehealth: Payer: Self-pay | Admitting: Family Medicine

## 2008-12-21 LAB — CONVERTED CEMR LAB
Alkaline Phosphatase: 110 units/L (ref 39–117)
CO2: 26 meq/L (ref 19–32)
Calcium: 9.2 mg/dL (ref 8.4–10.5)
Chloride: 100 meq/L (ref 96–112)
Creatinine, Ser: 0.7 mg/dL (ref 0.4–1.2)
GFR calc Af Amer: 115 mL/min
Glucose, Bld: 120 mg/dL — ABNORMAL HIGH (ref 70–99)
HDL: 38 mg/dL — ABNORMAL LOW (ref >39.0)
LDL Cholesterol: 87 mg/dL (ref 0–99)
Total CHOL/HDL Ratio: 3.8

## 2008-12-22 ENCOUNTER — Telehealth: Payer: Self-pay | Admitting: Family Medicine

## 2009-01-04 ENCOUNTER — Telehealth: Payer: Self-pay | Admitting: Family Medicine

## 2009-01-11 ENCOUNTER — Ambulatory Visit: Payer: Self-pay | Admitting: Family Medicine

## 2009-01-14 ENCOUNTER — Telehealth: Payer: Self-pay | Admitting: Family Medicine

## 2009-02-14 ENCOUNTER — Telehealth: Payer: Self-pay | Admitting: Family Medicine

## 2009-02-19 ENCOUNTER — Emergency Department: Payer: Self-pay | Admitting: Emergency Medicine

## 2009-02-22 ENCOUNTER — Ambulatory Visit: Payer: Self-pay | Admitting: Family Medicine

## 2009-02-22 DIAGNOSIS — G8929 Other chronic pain: Secondary | ICD-10-CM

## 2009-02-22 DIAGNOSIS — M545 Low back pain, unspecified: Secondary | ICD-10-CM | POA: Insufficient documentation

## 2009-02-25 ENCOUNTER — Telehealth: Payer: Self-pay | Admitting: Family Medicine

## 2009-02-28 ENCOUNTER — Ambulatory Visit: Payer: Self-pay | Admitting: Family Medicine

## 2009-02-28 ENCOUNTER — Other Ambulatory Visit: Admission: RE | Admit: 2009-02-28 | Discharge: 2009-02-28 | Payer: Self-pay | Admitting: Family Medicine

## 2009-02-28 ENCOUNTER — Encounter: Payer: Self-pay | Admitting: Family Medicine

## 2009-03-02 ENCOUNTER — Encounter: Payer: Self-pay | Admitting: Family Medicine

## 2009-03-07 ENCOUNTER — Encounter (INDEPENDENT_AMBULATORY_CARE_PROVIDER_SITE_OTHER): Payer: Self-pay | Admitting: *Deleted

## 2009-03-08 ENCOUNTER — Ambulatory Visit (HOSPITAL_COMMUNITY): Admission: RE | Admit: 2009-03-08 | Discharge: 2009-03-08 | Payer: Self-pay | Admitting: Family Medicine

## 2009-03-09 ENCOUNTER — Ambulatory Visit: Payer: Self-pay | Admitting: Family Medicine

## 2009-03-09 ENCOUNTER — Telehealth (INDEPENDENT_AMBULATORY_CARE_PROVIDER_SITE_OTHER): Payer: Self-pay | Admitting: *Deleted

## 2009-03-09 ENCOUNTER — Encounter: Payer: Self-pay | Admitting: Family Medicine

## 2009-03-09 ENCOUNTER — Telehealth: Payer: Self-pay | Admitting: Family Medicine

## 2009-03-09 LAB — CONVERTED CEMR LAB
Alkaline Phosphatase: 155 units/L — ABNORMAL HIGH (ref 39–117)
BUN: 15 mg/dL (ref 6–23)
Bilirubin, Direct: 0.1 mg/dL (ref 0.0–0.3)
CO2: 28 meq/L (ref 19–32)
Calcium: 9 mg/dL (ref 8.4–10.5)
Chloride: 109 meq/L (ref 96–112)
Creatinine, Ser: 0.7 mg/dL (ref 0.4–1.2)
GFR calc non Af Amer: 94.6 mL/min (ref >60)
Glucose, Bld: 209 mg/dL — ABNORMAL HIGH (ref 70–99)
Potassium: 4 meq/L (ref 3.5–5.1)
Total Bilirubin: 0.5 mg/dL (ref 0.3–1.2)

## 2009-03-11 ENCOUNTER — Telehealth: Payer: Self-pay | Admitting: Family Medicine

## 2009-03-14 ENCOUNTER — Telehealth: Payer: Self-pay | Admitting: Family Medicine

## 2009-03-14 ENCOUNTER — Telehealth (INDEPENDENT_AMBULATORY_CARE_PROVIDER_SITE_OTHER): Payer: Self-pay | Admitting: Internal Medicine

## 2009-03-16 ENCOUNTER — Ambulatory Visit: Payer: Self-pay | Admitting: Family Medicine

## 2009-03-29 ENCOUNTER — Encounter: Payer: Self-pay | Admitting: Family Medicine

## 2009-04-11 ENCOUNTER — Telehealth: Payer: Self-pay | Admitting: Family Medicine

## 2009-04-12 ENCOUNTER — Telehealth: Payer: Self-pay | Admitting: Family Medicine

## 2009-04-13 ENCOUNTER — Telehealth: Payer: Self-pay | Admitting: Family Medicine

## 2009-04-20 ENCOUNTER — Ambulatory Visit: Payer: Self-pay | Admitting: Family Medicine

## 2009-04-20 LAB — CONVERTED CEMR LAB
Bilirubin Urine: NEGATIVE
Specific Gravity, Urine: 1.01
Urobilinogen, UA: 0.2
pH: 6.5

## 2009-04-21 ENCOUNTER — Encounter: Payer: Self-pay | Admitting: Family Medicine

## 2009-05-11 ENCOUNTER — Telehealth: Payer: Self-pay | Admitting: Family Medicine

## 2009-05-12 ENCOUNTER — Telehealth: Payer: Self-pay | Admitting: Family Medicine

## 2009-05-23 ENCOUNTER — Telehealth: Payer: Self-pay | Admitting: Family Medicine

## 2009-06-09 ENCOUNTER — Telehealth: Payer: Self-pay | Admitting: Family Medicine

## 2009-06-13 ENCOUNTER — Ambulatory Visit: Payer: Self-pay | Admitting: Family Medicine

## 2009-06-13 LAB — CONVERTED CEMR LAB
ALT: 22 units/L (ref 0–35)
AST: 28 units/L (ref 0–37)
Albumin: 3.1 g/dL — ABNORMAL LOW (ref 3.5–5.2)
Alkaline Phosphatase: 145 units/L — ABNORMAL HIGH (ref 39–117)
Chloride: 104 meq/L (ref 96–112)
Cholesterol: 113 mg/dL (ref 0–200)
Hgb A1c MFr Bld: 6.8 % — ABNORMAL HIGH (ref 4.6–6.5)
LDL Cholesterol: 52 mg/dL (ref 0–99)
Potassium: 4.3 meq/L (ref 3.5–5.1)
Total CHOL/HDL Ratio: 4
Total Protein: 6.9 g/dL (ref 6.0–8.3)

## 2009-06-20 ENCOUNTER — Ambulatory Visit: Payer: Self-pay | Admitting: Family Medicine

## 2009-06-23 ENCOUNTER — Telehealth: Payer: Self-pay | Admitting: Family Medicine

## 2009-07-06 ENCOUNTER — Telehealth: Payer: Self-pay | Admitting: Family Medicine

## 2009-07-25 ENCOUNTER — Telehealth: Payer: Self-pay | Admitting: Family Medicine

## 2009-08-05 ENCOUNTER — Telehealth: Payer: Self-pay | Admitting: Family Medicine

## 2009-08-29 ENCOUNTER — Telehealth: Payer: Self-pay | Admitting: Family Medicine

## 2009-09-01 ENCOUNTER — Telehealth: Payer: Self-pay | Admitting: Family Medicine

## 2009-09-01 ENCOUNTER — Ambulatory Visit: Payer: Self-pay | Admitting: Family Medicine

## 2009-09-01 DIAGNOSIS — J45901 Unspecified asthma with (acute) exacerbation: Secondary | ICD-10-CM

## 2009-09-06 ENCOUNTER — Telehealth: Payer: Self-pay | Admitting: Family Medicine

## 2009-09-14 ENCOUNTER — Telehealth: Payer: Self-pay | Admitting: Family Medicine

## 2009-09-22 ENCOUNTER — Ambulatory Visit: Payer: Self-pay | Admitting: Family Medicine

## 2009-09-26 ENCOUNTER — Ambulatory Visit: Payer: Self-pay | Admitting: Family Medicine

## 2009-10-05 ENCOUNTER — Telehealth: Payer: Self-pay | Admitting: Family Medicine

## 2009-10-13 LAB — CONVERTED CEMR LAB
ALT: 19 units/L (ref 0–35)
BUN: 20 mg/dL (ref 6–23)
CO2: 28 meq/L (ref 19–32)
Creatinine, Ser: 1 mg/dL (ref 0.4–1.2)
Creatinine,U: 69.7 mg/dL
Glucose, Bld: 111 mg/dL — ABNORMAL HIGH (ref 70–99)
HDL: 30.9 mg/dL — ABNORMAL LOW (ref >39.00)
Total Bilirubin: 0.5 mg/dL (ref 0.3–1.2)
Total CHOL/HDL Ratio: 4
Triglycerides: 213 mg/dL — ABNORMAL HIGH (ref 0.0–149.0)
VLDL: 42.6 mg/dL — ABNORMAL HIGH (ref 0.0–40.0)

## 2009-10-14 LAB — CONVERTED CEMR LAB: Hgb A1c MFr Bld: 6.6 % — ABNORMAL HIGH (ref 4.6–6.5)

## 2009-10-31 ENCOUNTER — Encounter: Payer: Self-pay | Admitting: Family Medicine

## 2009-10-31 ENCOUNTER — Telehealth: Payer: Self-pay | Admitting: Family Medicine

## 2009-11-02 ENCOUNTER — Telehealth: Payer: Self-pay | Admitting: Family Medicine

## 2009-11-03 ENCOUNTER — Telehealth: Payer: Self-pay | Admitting: Family Medicine

## 2009-11-30 ENCOUNTER — Telehealth: Payer: Self-pay | Admitting: Family Medicine

## 2009-12-19 ENCOUNTER — Telehealth: Payer: Self-pay | Admitting: Family Medicine

## 2009-12-20 ENCOUNTER — Ambulatory Visit: Payer: Self-pay | Admitting: Family Medicine

## 2009-12-28 ENCOUNTER — Telehealth: Payer: Self-pay | Admitting: Family Medicine

## 2009-12-30 ENCOUNTER — Ambulatory Visit: Payer: Self-pay | Admitting: Family Medicine

## 2010-01-11 ENCOUNTER — Telehealth: Payer: Self-pay | Admitting: Family Medicine

## 2010-01-12 ENCOUNTER — Ambulatory Visit: Payer: Self-pay | Admitting: Family Medicine

## 2010-01-13 ENCOUNTER — Encounter: Payer: Self-pay | Admitting: Family Medicine

## 2010-01-25 ENCOUNTER — Telehealth: Payer: Self-pay | Admitting: Family Medicine

## 2010-02-14 ENCOUNTER — Telehealth: Payer: Self-pay | Admitting: Family Medicine

## 2010-02-22 ENCOUNTER — Telehealth: Payer: Self-pay | Admitting: Family Medicine

## 2010-03-08 ENCOUNTER — Ambulatory Visit: Payer: Self-pay | Admitting: Family Medicine

## 2010-03-09 LAB — CONVERTED CEMR LAB
AST: 38 units/L — ABNORMAL HIGH (ref 0–37)
Albumin: 3.9 g/dL (ref 3.5–5.2)
Alkaline Phosphatase: 172 units/L — ABNORMAL HIGH (ref 39–117)
BUN: 14 mg/dL (ref 6–23)
CO2: 30 meq/L (ref 19–32)
Chloride: 102 meq/L (ref 96–112)
Cholesterol: 167 mg/dL (ref 0–200)
Creatinine, Ser: 0.9 mg/dL (ref 0.4–1.2)
Hgb A1c MFr Bld: 7.4 % — ABNORMAL HIGH (ref 4.6–6.5)
Potassium: 4.4 meq/L (ref 3.5–5.1)
Sodium: 141 meq/L (ref 135–145)
Total CHOL/HDL Ratio: 4
Total Protein: 6.9 g/dL (ref 6.0–8.3)
Triglycerides: 73 mg/dL (ref 0.0–149.0)

## 2010-03-15 ENCOUNTER — Ambulatory Visit: Payer: Self-pay | Admitting: Family Medicine

## 2010-03-21 ENCOUNTER — Ambulatory Visit (HOSPITAL_COMMUNITY): Admission: RE | Admit: 2010-03-21 | Discharge: 2010-03-21 | Payer: Self-pay | Admitting: Family Medicine

## 2010-03-24 ENCOUNTER — Telehealth: Payer: Self-pay | Admitting: Family Medicine

## 2010-03-27 ENCOUNTER — Encounter (INDEPENDENT_AMBULATORY_CARE_PROVIDER_SITE_OTHER): Payer: Self-pay | Admitting: *Deleted

## 2010-04-10 ENCOUNTER — Ambulatory Visit: Payer: Self-pay | Admitting: Family Medicine

## 2010-04-10 LAB — CONVERTED CEMR LAB
Ketones, urine, test strip: NEGATIVE
Nitrite: POSITIVE
Protein, U semiquant: 100
pH: 6

## 2010-04-11 ENCOUNTER — Encounter: Payer: Self-pay | Admitting: Family Medicine

## 2010-04-20 ENCOUNTER — Telehealth: Payer: Self-pay | Admitting: Family Medicine

## 2010-04-26 ENCOUNTER — Telehealth: Payer: Self-pay | Admitting: Family Medicine

## 2010-05-17 ENCOUNTER — Telehealth: Payer: Self-pay | Admitting: Family Medicine

## 2010-06-13 ENCOUNTER — Ambulatory Visit: Payer: Self-pay | Admitting: Family Medicine

## 2010-06-16 ENCOUNTER — Telehealth: Payer: Self-pay | Admitting: Family Medicine

## 2010-06-16 LAB — CONVERTED CEMR LAB
BUN: 24 mg/dL — ABNORMAL HIGH (ref 6–23)
CO2: 25 meq/L (ref 19–32)
Chloride: 102 meq/L (ref 96–112)
Cholesterol: 162 mg/dL (ref 0–200)
Glucose, Bld: 156 mg/dL — ABNORMAL HIGH (ref 70–99)
Hgb A1c MFr Bld: 7.4 % — ABNORMAL HIGH (ref 4.6–6.5)
Potassium: 4.6 meq/L (ref 3.5–5.1)
Sodium: 137 meq/L (ref 135–145)
Total Bilirubin: 0.2 mg/dL — ABNORMAL LOW (ref 0.3–1.2)
Total CHOL/HDL Ratio: 5

## 2010-06-20 ENCOUNTER — Ambulatory Visit: Payer: Self-pay | Admitting: Family Medicine

## 2010-07-11 ENCOUNTER — Telehealth: Payer: Self-pay | Admitting: Family Medicine

## 2010-07-13 ENCOUNTER — Telehealth: Payer: Self-pay | Admitting: Family Medicine

## 2010-08-10 ENCOUNTER — Telehealth: Payer: Self-pay | Admitting: Family Medicine

## 2010-09-08 ENCOUNTER — Encounter: Payer: Self-pay | Admitting: Family Medicine

## 2010-09-08 ENCOUNTER — Telehealth: Payer: Self-pay | Admitting: Family Medicine

## 2010-09-26 ENCOUNTER — Ambulatory Visit: Payer: Self-pay | Admitting: Family Medicine

## 2010-09-27 ENCOUNTER — Ambulatory Visit: Payer: Self-pay | Admitting: Family Medicine

## 2010-09-27 LAB — CONVERTED CEMR LAB: Hgb A1c MFr Bld: 7 % — ABNORMAL HIGH (ref 4.6–6.5)

## 2010-10-03 ENCOUNTER — Ambulatory Visit: Payer: Self-pay | Admitting: Family Medicine

## 2010-10-09 ENCOUNTER — Telehealth: Payer: Self-pay | Admitting: Family Medicine

## 2010-11-07 ENCOUNTER — Telehealth: Payer: Self-pay | Admitting: Family Medicine

## 2010-12-04 ENCOUNTER — Telehealth: Payer: Self-pay | Admitting: Family Medicine

## 2010-12-05 NOTE — Progress Notes (Signed)
Summary: refill requests for flexeril, xanax  Phone Note Refill Request Message from:  Patient  Refills Requested: Medication #1:  CYCLOBENZAPRINE HCL 10 MG TABS Take 1 tablet by mouth at bedtime  Medication #2:  ALPRAZOLAM 0.5 MG TABS 1 by mouth three times a day as needed anxiety Phoned request from pt, these are a little early but she is going out of town and wont be back till next week.  [facility].  Initial call taken by: Lowella Petties CMA,  July 13, 2010 9:16 AM  Follow-up for Phone Call        GAve 2 refills of flexeril alst time..should have these. Refilled xanax Follow-up by: Kerby Nora MD,  July 14, 2010 10:10 AM  Additional Follow-up for Phone Call Additional follow up Details #1::        [facility]anax [facility].  She agreed that she does not need refills on flexeril.  Additional Follow-up by: Lowella Petties CMA,  July 14, 2010 10:30 AM    Prescriptions: ALPRAZOLAM 0.5 MG TABS (ALPRAZOLAM) 1 by mouth three times a day as needed anxiety  #90 x 0   Entered and Authorized by:   Kerby Nora MD   Signed by:   Kerby Nora MD on 07/14/2010   Method used:   Telephoned to ...       [facility] [address] [phone]* (retail)       [address]       [place], [place]  [address]       Ph: [phone] or [phone]       Fax: [phone]   RxID:   [id number]

## 2010-12-05 NOTE — Progress Notes (Signed)
Summary: refill request for alprazolam  Phone Note Refill Request Message from:  Fax from Pharmacy  Refills Requested: Medication #1:  ALPRAZOLAM 0.5 MG TABS 1 by mouth three times a day as needed anxiety   Last Refilled: 04/21/2010 [facility]axed [facility], [phone].  Initial call taken by: Lowella Petties CMA,  May 17, 2010 11:37 AM  Follow-up for Phone Call        Rx called to pharmacy Follow-up by: Linde Gillis CMA Duncan Dull),  May 19, 2010 9:12 AM    Prescriptions: ALPRAZOLAM 0.5 MG TABS (ALPRAZOLAM) 1 by mouth three times a day as needed anxiety  #90 x 0   Entered and Authorized by:   Kerby Nora MD   Signed by:   Kerby Nora MD on 05/18/2010   Method used:   Telephoned to ...       [facility] [address] [phone]* (retail)       [address]       [place], [place]  [address]       Ph: [phone] or [phone]       Fax: [phone]   RxID:   [id number]

## 2010-12-05 NOTE — Assessment & Plan Note (Signed)
Summary: 3 m f/u dlo   Vital Signs:  Patient profile:   50 year old female Height:      60 inches Weight:      214 pounds BMI:     41.95 Temp:     98.2 degrees F oral Pulse rate:   72 / minute Pulse rhythm:   regular BP sitting:   102 / 64  (left arm) Cuff size:   large  Vitals Entered By: Linde Gillis CMA Duncan Dull) (June 20, 2010 3:18 PM) CC: 3 month follow up   History of Present Illness: DM, inadequate control: on glipizide, actos and metformin.  May be elevated due to recent tooth infection.  Walking daily, following diet.  Has lost 6 lbs in last 2 months.  BP at goal <130/80  High cholesterol improved on lopid.   LDL at goal <100.   Anxiety..moderate control. On amitryptiline at night. Using xanax as needed.   Fibromyalgia.no carotid bruit or thyromegaly pain and shoulder pain.Marland Kitchenibuprofen, tramadol and muscle relaxant helps.  Recently had tooth pulled...on clindamycin for oral infection.  Dentures recommended.  Problems Prior to Update: 1)  Dysuria  (ICD-788.1) 2)  Asthma, Extrinsic, With Acute Exacerbation  (ICD-493.02) 3)  Routine Gynecological Examination  (ICD-V72.31) 4)  Well Woman  (ICD-V70.0) 5)  Other Screening Mammogram  (ICD-V76.12) 6)  Back Pain, Chronic  (ICD-724.5) 7)  Hematochezia  (ICD-578.1) 8)  Osteoarthritis  (ICD-715.90) 9)  Renal Calculus  (ICD-592.0) 10)  Anxiety  (ICD-300.00) 11)  Tobacco Abuse  (ICD-305.1) 12)  Sleep Apnea  (ICD-780.57) 13)  Hyperlipidemia  (ICD-272.4) 14)  Gerd  (ICD-530.81) 15)  Diabetes Mellitus, Type II  (ICD-250.00) 16)  Depression  (ICD-311) 17)  Allergic Rhinitis  (ICD-477.9) 18)  Fibromyalgia  (ICD-729.1)  Current Medications (verified): 1)  Lopid 600 Mg Tabs (Gemfibrozil) .... Take 1 Tablet By Mouth Twice A Day 2)  Glipizide 10 Mg Tabs (Glipizide) .... Take 1 Tablet By Mouth Daily 3)  Amitriptyline Hcl 50 Mg Tabs (Amitriptyline Hcl) .... Take 1 Tablet By Mouth Every Night 4)  Cyclobenzaprine Hcl 10 Mg  Tabs (Cyclobenzaprine Hcl) .... Take 1 Tablet By Mouth At Bedtime 5)  Tramadol Hcl 50 Mg Tabs (Tramadol Hcl) .... Take 2  Tablet By Mouth Every Eight Hours As Needed 6)  Benazepril Hcl 10 Mg Tabs (Benazepril Hcl) .... Take 1 Tablet By Mouth Once A Day 7)  Metformin Hcl 1000 Mg Tabs (Metformin Hcl) .... Take 1 Tablet By Mouth Twice A Day 8)  Alprazolam 0.5 Mg Tabs (Alprazolam) .Marland Kitchen.. 1 By Mouth Three Times A Day As Needed Anxiety 9)  Proair Hfa 108 (90 Base) Mcg/act Aers (Albuterol Sulfate) .... Inhale Two Puffs Four Times A Day 10)  Actos 30 Mg  Tabs (Pioglitazone Hcl) .... Take 1 Tablet By Mouth Once A Day 11)  Eq Chlortabs 4 Mg  Tabs (Chlorpheniramine Maleate) .... As Needed 12)  Ibuprofen 800 Mg Tabs (Ibuprofen) .Marland Kitchen.. 1 Tab By Mouth Three Times A Day As Needed Pain 13)  Symbicort 80-4.5 Mcg/act Aero (Budesonide-Formoterol Fumarate) .... 2 Puffs Inhaled Two Times A Day 14)  Ondansetron 4 Mg Tbdp (Ondansetron) .Marland Kitchen.. 1 By Mouth Q 8 Hours As Needed Nausea 15)  Fish Oil 1000 Mg Caps (Omega-3 Fatty Acids) .Marland Kitchen.. 1 Tab By Mouth Two Times A Day 16)  Cleocin 150 Mg Caps (Clindamycin Hcl) .... Take One Tablet By Mouth Every Six Hours 17)  Vicodin 5-500 Mg Tabs (Hydrocodone-Acetaminophen) .... Take One Tablet By Mouth Every Four To Six  Hours As Needed For Pain  Allergies: 1)  ! Diclofenac Sodium (Diclofenac Sodium) 2)  ! Aspirin 3)  ! * Ansaids 4)  ! Advair Hfa (Fluticasone-Salmeterol) 5)  Penicillin G Potassium (Penicillin G Potassium)  Past History:  Past medical, surgical, family and social histories (including risk factors) reviewed, and no changes noted (except as noted below).  Past Medical History: Reviewed history from 03/12/2007 and no changes required. Allergic rhinitis Asthma Depression Diabetes mellitus, type II GERD Hyperlipidemia  Past Surgical History: Reviewed history from 06/24/2007 and no changes required. Appendectomy 1973 Cholecystectomy 08/2006  Family  History: Reviewed history from 06/24/2007 and no changes required. Father: 83 Stroke in 27-Mar-1987 Mother: Died 52 tubal pregnancy Siblings: None BP:  (+) DM:  (+) Arthritis:  (+) CA:  Uncle, lung CA  Social History: Reviewed history from 06/24/2007 and no changes required. Current Smoker  ~ 1 PPD Alcohol use-no Drug use-no Marital Status: Married Children:  Occupation: Housewife  Review of Systems General:  Denies fatigue and fever. ENT:  Complains of postnasal drainage; denies earache, nasal congestion, and sinus pressure; chest congestion x 5-7 days . CV:  Denies chest pain or discomfort. Resp:  Denies shortness of breath, sputum productive, and wheezing. GI:  Denies abdominal pain. GU:  Denies dysuria.  Physical Exam  General:  Obese appearing female in NAD Head:  no maxillary sinus ttp Eyes:  No corneal or conjunctival inflammation noted. EOMI. Perrla. Funduscopic exam benign, without hemorrhages, exudates or papilledema. Vision grossly normal. Ears:  External ear exam shows no significant lesions or deformities.  Otoscopic examination reveals clear canals, tympanic membranes are intact bilaterally without bulging, retraction, inflammation or discharge. Hearing is grossly normal bilaterally. Nose:  nasal dischargemucosal pallor.   Mouth:  MMM, irritated creases B mouth  Poor dentition Neck:  no carotid bruit or thyromegaly no cervical or supraclavicular lymphadenopathy  Lungs:  Normal respiratory effort, chest expands symmetrically. Lungs are clear to auscultation, no crackles or wheezes. Heart:  Normal rate and regular rhythm. S1 and S2 normal without gallop, murmur, click, rub or other extra sounds. Abdomen:  Bowel sounds positive,abdomen soft and non-tender without masses, organomegaly or hernias noted. Pulses:  R and L posterior tibial pulses are full and equal bilaterally  Extremities:  no edema   Diabetes Management Exam:    Foot Exam (with socks and/or shoes not  present):       Sensory-Pinprick/Light touch:          Left medial foot (L-4): normal          Left dorsal foot (L-5): normal          Left lateral foot (S-1): normal          Right medial foot (L-4): normal          Right dorsal foot (L-5): normal          Right lateral foot (S-1): normal       Sensory-Monofilament:          Left foot: normal          Right foot: normal       Inspection:          Left foot: normal          Right foot: normal       Nails:          Left foot: normal          Right foot: normal   Impression & Recommendations:  Problem # 1:  DIABETES MELLITUS, TYPE II (ICD-250.00) Inadequate control. She does not wish to make med cahnges if she can avoid it...will continue to work on lifestyle changes... If not improved at next OV.Marland Kitchendiscuss increase actos or change to Byetta/victoza.  Her updated medication list for this problem includes:    Glipizide 10 Mg Tabs (Glipizide) .Marland Kitchen... Take 1 tablet by mouth daily    Benazepril Hcl 10 Mg Tabs (Benazepril hcl) .Marland Kitchen... Take 1 tablet by mouth once a day    Metformin Hcl 1000 Mg Tabs (Metformin hcl) .Marland Kitchen... Take 1 tablet by mouth twice a day    Actos 30 Mg Tabs (Pioglitazone hcl) .Marland Kitchen... Take 1 tablet by mouth once a day  Problem # 2:  HYPERLIPIDEMIA (ICD-272.4) IMproved control.  Her updated medication list for this problem includes:    Lopid 600 Mg Tabs (Gemfibrozil) .Marland Kitchen... Take 1 tablet by mouth twice a day  Labs Reviewed: SGOT: 29 (06/13/2010)   SGPT: 21 (06/13/2010)  Lipid Goals: Chol Goal: 200 (07/15/2007)   HDL Goal: 40 (07/15/2007)   LDL Goal: 100 (07/15/2007)   TG Goal: 150 (07/15/2007)  Prior 10 Yr Risk Heart Disease: 13 % (03/15/2010)   HDL:34.80 (06/13/2010), 37.70 (03/08/2010)  LDL:98 (06/13/2010), 115 (16/08/9603)  Chol:162 (06/13/2010), 167 (03/08/2010)  Trig:148.0 (06/13/2010), 73.0 (03/08/2010)  Problem # 3:  DEPRESSION (ICD-311) Stable on current meds.  Her updated medication list for this problem  includes:    Amitriptyline Hcl 50 Mg Tabs (Amitriptyline hcl) .Marland Kitchen... Take 1 tablet by mouth every night    Alprazolam 0.5 Mg Tabs (Alprazolam) .Marland Kitchen... 1 by mouth three times a day as needed anxiety  Problem # 4:  FIBROMYALGIA (ICD-729.1) Stable on current meds.  Her updated medication list for this problem includes:    Cyclobenzaprine Hcl 10 Mg Tabs (Cyclobenzaprine hcl) .Marland Kitchen... Take 1 tablet by mouth at bedtime    Tramadol Hcl 50 Mg Tabs (Tramadol hcl) .Marland Kitchen... Take 2  tablet by mouth every eight hours as needed    Ibuprofen 800 Mg Tabs (Ibuprofen) .Marland Kitchen... 1 tab by mouth three times a day as needed pain    Vicodin 5-500 Mg Tabs (Hydrocodone-acetaminophen) .Marland Kitchen... Take one tablet by mouth every four to six hours as needed for pain  Problem # 5:  ALLERGIC RHINITIS (ICD-477.9) Start zyrtec at bedtime. Avoid decongestant.  No asthma flare...continue symbicort.  Her updated medication list for this problem includes:    Eq Chlortabs 4 Mg Tabs (Chlorpheniramine maleate) .Marland Kitchen... As needed  Complete Medication List: 1)  Lopid 600 Mg Tabs (Gemfibrozil) .... Take 1 tablet by mouth twice a day 2)  Glipizide 10 Mg Tabs (Glipizide) .... Take 1 tablet by mouth daily 3)  Amitriptyline Hcl 50 Mg Tabs (Amitriptyline hcl) .... Take 1 tablet by mouth every night 4)  Cyclobenzaprine Hcl 10 Mg Tabs (Cyclobenzaprine hcl) .... Take 1 tablet by mouth at bedtime 5)  Tramadol Hcl 50 Mg Tabs (Tramadol hcl) .... Take 2  tablet by mouth every eight hours as needed 6)  Benazepril Hcl 10 Mg Tabs (Benazepril hcl) .... Take 1 tablet by mouth once a day 7)  Metformin Hcl 1000 Mg Tabs (Metformin hcl) .... Take 1 tablet by mouth twice a day 8)  Alprazolam 0.5 Mg Tabs (Alprazolam) .Marland Kitchen.. 1 by mouth three times a day as needed anxiety 9)  Proair Hfa 108 (90 Base) Mcg/act Aers (Albuterol sulfate) .... Inhale two puffs four times a day 10)  Actos 30 Mg Tabs (Pioglitazone hcl) .... Take 1 tablet by mouth once a  day 11)  Eq Chlortabs 4 Mg Tabs  (Chlorpheniramine maleate) .... As needed 12)  Ibuprofen 800 Mg Tabs (Ibuprofen) .Marland Kitchen.. 1 tab by mouth three times a day as needed pain 13)  Symbicort 80-4.5 Mcg/act Aero (Budesonide-formoterol fumarate) .... 2 puffs inhaled two times a day 14)  Ondansetron 4 Mg Tbdp (Ondansetron) .Marland Kitchen.. 1 by mouth q 8 hours as needed nausea 15)  Fish Oil 1000 Mg Caps (Omega-3 fatty acids) .Marland Kitchen.. 1 tab by mouth two times a day 16)  Cleocin 150 Mg Caps (Clindamycin hcl) .... Take one tablet by mouth every six hours 17)  Vicodin 5-500 Mg Tabs (Hydrocodone-acetaminophen) .... Take one tablet by mouth every four to six hours as needed for pain  Patient Instructions: 1)  Continue work on liefstyle change. Increase exercise. 2)  HgBA1c prior to visit  ICD-9: 250.00 3)  Please schedule a follow-up appointment in 3 months  DM 4)  See your eye doctor yearly to check for diabetic eye damage. 5)   May try zyrtec at bedtime or allegra. Avoid decongestant.    Orders Added: 1)  Est. Patient Level IV [[address]]   Current Allergies (reviewed today): ! DICLOFENAC SODIUM (DICLOFENAC SODIUM) ! ASPIRIN ! * ANSAIDS ! ADVAIR HFA (FLUTICASONE-SALMETEROL) PENICILLIN G POTASSIUM (PENICILLIN G POTASSIUM)

## 2010-12-05 NOTE — Assessment & Plan Note (Signed)
Summary: ? UTI   Vital Signs:  Patient profile:   50 year old female Height:      60 inches Weight:      229.6 pounds BMI:     45.00 Temp:     97.5 degrees F oral Pulse rate:   78 / minute Pulse rhythm:   regular BP sitting:   120 / 72  (left arm) Cuff size:   large  Vitals Entered By: Benny Lennert CMA Duncan Dull) (January 12, 2010 3:13 PM)  History of Present Illness: Chief complaint ? uti symptoms for 4 days and also having alot of nausea  50 year old female:   Having a lot of problems with her husband Her mother does not know what do with husband   Dysuria, fever, back pain, nausea  problems with husband  Allergies: 1)  ! Diclofenac Sodium (Diclofenac Sodium) 2)  ! Aspirin 3)  ! * Ansaids 4)  ! Advair Hfa (Fluticasone-Salmeterol) 5)  Penicillin G Potassium (Penicillin G Potassium)  Past History:  Past medical, surgical, family and social histories (including risk factors) reviewed, and no changes noted (except as noted below).  Past Medical History: Reviewed history from 03/12/2007 and no changes required. Allergic rhinitis Asthma Depression Diabetes mellitus, type II GERD Hyperlipidemia  Past Surgical History: Reviewed history from 06/24/2007 and no changes required. Appendectomy 1973 Cholecystectomy 08/2006  Family History: Reviewed history from 06/24/2007 and no changes required. Father: 61 Stroke in 03/31/1987 Mother: Died 79 tubal pregnancy Siblings: None BP:  (+) DM:  (+) Arthritis:  (+) CA:  Uncle, lung CA  Social History: Reviewed history from 06/24/2007 and no changes required. Current Smoker  ~ 1 PPD Alcohol use-no Drug use-no Marital Status: Married Children:  Occupation: Housewife  Review of Systems       n, no v, other uti signs as above  Physical Exam  General:  Well-developed,well-nourished,in no acute distress; alert,appropriate and cooperative throughout examination Head:  Normocephalic and atraumatic without obvious  abnormalities. No apparent alopecia or balding. Ears:  no external deformities.   Nose:  no external deformity.   Abdomen:  + suprapubic tenderness, soft, normal bowel sounds, no distention, no masses, no guarding, no rigidity, no hepatomegaly, and no splenomegaly.   Extremities:  no edmea  Neurologic:  alert & oriented X3 and gait normal.   Psych:  memory intact for recent and remote and normally interactive.     Impression & Recommendations:  Problem # 1:  UTI (ICD-599.0) Assessment New  back pain, fever - concern for pyelo and will treat longer  Her updated medication list for this problem includes:    Ciprofloxacin Hcl 500 Mg Tabs (Ciprofloxacin hcl) .Marland Kitchen... 1 by mouth two times a day  Orders: T-Culture, Urine ([id number])  Problem # 2:  ANXIETY (ICD-300.00) >25 minutes spent in face to face time with patient, >50% spent in counselling or coordination of care: I spent the majority of this visit actually talked to patient about her interaction with her husband, her mental state, and his mental decompensation. I remember her husband well, and he has appeared not particularly well to me from a mental health standpoint. His possible wasn't passable in her. We discussed different treatment options, potential diagnoses, and I recommended to her that ultimately psychiatric care for him would likely be the best thing. Overall adjustment counselling  Her updated medication list for this problem includes:    Amitriptyline Hcl 50 Mg Tabs (Amitriptyline hcl) .Marland Kitchen... Take 1 tablet by mouth every night  Alprazolam 0.5 Mg Tabs (Alprazolam) .Marland Kitchen... 1 by mouth three times a day as needed anxiety  Complete Medication List: 1)  Lopid 600 Mg Tabs (Gemfibrozil) .... Take 1 tablet by mouth twice a day 2)  Glipizide 10 Mg Tabs (Glipizide) .... Take 1 tablet by mouth daily 3)  Amitriptyline Hcl 50 Mg Tabs (Amitriptyline hcl) .... Take 1 tablet by mouth every night 4)  Cyclobenzaprine Hcl 10 Mg Tabs  (Cyclobenzaprine hcl) .... Take 1 tablet by mouth at bedtime 5)  Tramadol Hcl 50 Mg Tabs (Tramadol hcl) .... Take 2  tablet by mouth every eight hours as needed 6)  Benazepril Hcl 10 Mg Tabs (Benazepril hcl) .... Take 1 tablet by mouth once a day 7)  Metformin Hcl 1000 Mg Tabs (Metformin hcl) .... Take 1 tablet by mouth twice a day 8)  Alprazolam 0.5 Mg Tabs (Alprazolam) .Marland Kitchen.. 1 by mouth three times a day as needed anxiety 9)  Ventolin Hfa 108 (90 Base) Mcg/act Aers (Albuterol sulfate) .... Inhale 2 puffs 4 times daily 10)  Actos 30 Mg Tabs (Pioglitazone hcl) .... Take 1 tablet by mouth once a day 11)  Eq Chlortabs 4 Mg Tabs (Chlorpheniramine maleate) .... As needed 12)  Ibuprofen 800 Mg Tabs (Ibuprofen) .Marland Kitchen.. 1 tab by mouth three times a day as needed pain 13)  Symbicort 80-4.5 Mcg/act Aero (Budesonide-formoterol fumarate) .... 2 puffs inhaled two times a day 14)  Ciprofloxacin Hcl 500 Mg Tabs (Ciprofloxacin hcl) .Marland Kitchen.. 1 by mouth two times a day 15)  Ondansetron 4 Mg Tbdp (Ondansetron) .Marland Kitchen.. 1 by mouth q 8 hours as needed nausea Prescriptions: ONDANSETRON 4 MG TBDP (ONDANSETRON) 1 by mouth q 8 hours as needed nausea  #30 x 0   Entered and Authorized by:   Hannah Beat MD   Signed by:   Hannah Beat MD on 01/12/2010   Method used:   Electronically to        [facility] [address] [phone]* (retail)       [address]       [place], [place]  [address]       Ph: [phone] or [phone]       Fax: [phone]   RxID:   [id number] CIPROFLOXACIN HCL 500 MG TABS (CIPROFLOXACIN HCL) 1 by mouth two times a day  #28 x 0   Entered and Authorized by:   Hannah Beat MD   Signed by:   Hannah Beat MD on 01/12/2010   Method used:   Electronically to        [facility] [address] [phone]* (retail)       [address]       [place], [place]  [address]       Ph: [phone] or [phone]       Fax: [phone]   RxID:    [id number]   Current Allergies (reviewed today): ! DICLOFENAC SODIUM (DICLOFENAC SODIUM) ! ASPIRIN ! * ANSAIDS ! ADVAIR HFA (FLUTICASONE-SALMETEROL) PENICILLIN G POTASSIUM (PENICILLIN G POTASSIUM)  Appended Document: ? UTI

## 2010-12-05 NOTE — Progress Notes (Signed)
Summary: refill request for alprazolam  Phone Note Refill Request Message from:  Fax from Pharmacy  Refills Requested: Medication #1:  ALPRAZOLAM 0.5 MG TABS 1 by mouth three times a day as needed anxiety   Last Refilled: 12/01/2009 Faxed request from [employer], phone [phone].  Initial call taken by: Lowella Petties CMA,  December 28, 2009 11:51 AM  Follow-up for Phone Call        Rx called to pharmacy Follow-up by: Linde Gillis CMA Duncan Dull),  December 28, 2009 5:30 PM    Prescriptions: ALPRAZOLAM 0.5 MG TABS (ALPRAZOLAM) 1 by mouth three times a day as needed anxiety  #90 x 0   Entered and Authorized by:   Kerby Nora MD   Signed by:   Kerby Nora MD on 12/28/2009   Method used:   Telephoned to ...       [facility] [address] [phone]* (retail)       [address]       [place], [place]  [address]       Ph: [phone] or [phone]       Fax: [phone]   RxID:   [id number]

## 2010-12-05 NOTE — Progress Notes (Signed)
Summary: Rx Tramadol  Phone Note Refill Request Call back at [phone] Message from:  [facility] on July 11, 2010 8:04 AM  Refills Requested: Medication #1:  TRAMADOL HCL 50 MG TABS Take 2  tablet by mouth every eight hours as needed Received E-script request please advise.   Method Requested: Telephone to Pharmacy Initial call taken by: Linde Gillis CMA Duncan Dull),  July 11, 2010 8:04 AM  Follow-up for Phone Call        AEB patient, and Dr. B in office today. Follow-up by: Hannah Beat MD,  July 11, 2010 8:29 AM  Additional Follow-up for Phone Call Additional follow up Details #1::        Rx called to pharmacy Additional Follow-up by: Linde Gillis CMA Duncan Dull),  July 11, 2010 9:48 AM    Prescriptions: TRAMADOL HCL 50 MG TABS (TRAMADOL HCL) Take 2  tablet by mouth every eight hours as needed  #180 Tablet x 0   Entered and Authorized by:   Kerby Nora MD   Signed by:   Kerby Nora MD on 07/11/2010   Method used:   Telephoned to ...       [facility] [address] [phone]* (retail)       [address]       [place], [place]  [address]       Ph: [phone] or [phone]       Fax: [phone]   RxID:   [id number]

## 2010-12-05 NOTE — Assessment & Plan Note (Signed)
Summary: COUGH   Vital Signs:  Patient profile:   50 year old female Height:      60 inches Weight:      218 pounds BMI:     42.73 Temp:     98.5 degrees F oral Pulse rate:   76 / minute Pulse rhythm:   regular BP sitting:   130 / 72  (left arm) Cuff size:   large  Vitals Entered By: Linde Gillis CMA [facility]) (September 27, 2010 8:13 AM) CC: cough   History of Present Illness: 50 yo here for URI symptoms.  Started 4 weeks ago with runny nose, dry cough. Now cough productive and having bad coughing spells at night. Felt feverish yesterday. has fibromyalgia but body aches are worse.  Has PCN allergy.  Current Medications (verified): 1)  Lopid 600 Mg Tabs (Gemfibrozil) .... Take 1 Tablet By Mouth Twice A Day 2)  Glipizide 10 Mg Tabs (Glipizide) .... Take 1 Tablet By Mouth Daily 3)  Amitriptyline Hcl 50 Mg Tabs (Amitriptyline Hcl) .... Take 1 Tablet By Mouth Every Night 4)  Cyclobenzaprine Hcl 10 Mg Tabs (Cyclobenzaprine Hcl) .... Take 1 Tablet By Mouth At Bedtime 5)  Tramadol Hcl 50 Mg Tabs (Tramadol Hcl) .... Take 2  Tablet By Mouth Every Eight Hours As Needed 6)  Benazepril Hcl 10 Mg Tabs (Benazepril Hcl) .... Take 1 Tablet By Mouth Once A Day 7)  Metformin Hcl 1000 Mg Tabs (Metformin Hcl) .... Take 1 Tablet By Mouth Twice A Day 8)  Alprazolam 0.5 Mg Tabs (Alprazolam) .Marland Kitchen.. 1 By Mouth Three Times A Day As Needed Anxiety 9)  Proair Hfa 108 (90 Base) Mcg/act Aers (Albuterol Sulfate) .... Inhale Two Puffs Four Times A Day 10)  Actos 30 Mg  Tabs (Pioglitazone Hcl) .... Take 1 Tablet By Mouth Once A Day 11)  Eq Chlortabs 4 Mg  Tabs (Chlorpheniramine Maleate) .... As Needed 12)  Ibuprofen 800 Mg Tabs (Ibuprofen) .Marland Kitchen.. 1 Tab By Mouth Three Times A Day As Needed Pain 13)  Symbicort 80-4.5 Mcg/act Aero (Budesonide-Formoterol Fumarate) .... 2 Puffs Inhaled Two Times A Day 14)  Ondansetron 4 Mg Tbdp (Ondansetron) .Marland Kitchen.. 1 By Mouth Q 8 Hours As Needed Nausea 15)  Fish Oil 1000 Mg Caps  (Omega-3 Fatty Acids) .Marland Kitchen.. 1 Tab By Mouth Two Times A Day 16)  Tussionex Pennkinetic Er 8-10 Mg/90ml Lqcr (Chlorpheniramine-Hydrocodone) .... 5 Ml By Mouth At Bedtime As Needed Cough 17)  Azithromycin 250 Mg  Tabs (Azithromycin) .... 2 By  Mouth Today and Then 1 Daily For 4 Days  Allergies: 1)  ! Diclofenac Sodium (Diclofenac Sodium) 2)  ! Aspirin 3)  ! * Ansaids 4)  ! Advair Hfa (Fluticasone-Salmeterol) 5)  Penicillin G Potassium (Penicillin G Potassium)  Past History:  Past Medical History: Last updated: 03/12/2007 Allergic rhinitis Asthma Depression Diabetes mellitus, type II GERD Hyperlipidemia  Past Surgical History: Last updated: 06/24/2007 Appendectomy 1973 Cholecystectomy 08/2006  Family History: Last updated: 06/24/2007 Father: 33 Stroke in 29-Mar-1987 Mother: Died 46 tubal pregnancy Siblings: None BP:  (+) DM:  (+) Arthritis:  (+) CA:  Uncle, lung CA  Social History: Last updated: 06/24/2007 Current Smoker  ~ 1 PPD Alcohol use-no Drug use-no Marital Status: Married Children:  Occupation: Housewife  Risk Factors: Exercise: yes (04/03/2007)  Risk Factors: Smoking Status: current (06/24/2007)  Review of Systems      See HPI General:  Complains of chills and fever. ENT:  Complains of nasal congestion, sinus pressure, and sore throat. Resp:  Complains of cough, sputum productive, and wheezing; denies shortness of breath.  Physical Exam  General:  Obese appearing female in NAD VSS, non toxic appearing Ears:  External ear exam shows no significant lesions or deformities.  Otoscopic examination reveals clear canals, tympanic membranes are intact bilaterally without bulging, retraction, inflammation or discharge. Hearing is grossly normal bilaterally. Nose:  boggy turbinates, frontal sinuses TTP bilaterally Mouth:  pharyngeal erythema.   Lungs:  Normal respiratory effort, chest expands symmetrically. Lungs are clear to auscultation, no crackles or  wheezes. Heart:  Normal rate and regular rhythm. S1 and S2 normal without gallop, murmur, click, rub or other extra sounds. Skin:  Intact without suspicious lesions or rashes Psych:  memory intact for recent and remote and normally interactive.     Impression & Recommendations:  Problem # 1:  ACUTE FRONTAL SINUSITIS (ICD-461.1) Assessment New Given duration and progression of symptoms, will treat for bacterials sinusitis with Zpack. Supportive care as per pt instructions. The following medications were removed from the medication list:    Cleocin 150 Mg Caps (Clindamycin hcl) .Marland Kitchen... Take one tablet by mouth every six hours Her updated medication list for this problem includes:    Tussionex Pennkinetic Er 8-10 Mg/44ml Lqcr (Chlorpheniramine-hydrocodone) .Marland KitchenMarland KitchenMarland KitchenMarland Kitchen 5 ml by mouth at bedtime as needed cough    Azithromycin 250 Mg Tabs (Azithromycin) .Marland Kitchen... 2 by  mouth today and then 1 daily for 4 days  Complete Medication List: 1)  Lopid 600 Mg Tabs (Gemfibrozil) .... Take 1 tablet by mouth twice a day 2)  Glipizide 10 Mg Tabs (Glipizide) .... Take 1 tablet by mouth daily 3)  Amitriptyline Hcl 50 Mg Tabs (Amitriptyline hcl) .... Take 1 tablet by mouth every night 4)  Cyclobenzaprine Hcl 10 Mg Tabs (Cyclobenzaprine hcl) .... Take 1 tablet by mouth at bedtime 5)  Tramadol Hcl 50 Mg Tabs (Tramadol hcl) .... Take 2  tablet by mouth every eight hours as needed 6)  Benazepril Hcl 10 Mg Tabs (Benazepril hcl) .... Take 1 tablet by mouth once a day 7)  Metformin Hcl 1000 Mg Tabs (Metformin hcl) .... Take 1 tablet by mouth twice a day 8)  Alprazolam 0.5 Mg Tabs (Alprazolam) .Marland Kitchen.. 1 by mouth three times a day as needed anxiety 9)  Proair Hfa 108 (90 Base) Mcg/act Aers (Albuterol sulfate) .... Inhale two puffs four times a day 10)  Actos 30 Mg Tabs (Pioglitazone hcl) .... Take 1 tablet by mouth once a day 11)  Eq Chlortabs 4 Mg Tabs (Chlorpheniramine maleate) .... As needed 12)  Ibuprofen 800 Mg Tabs  (Ibuprofen) .Marland Kitchen.. 1 tab by mouth three times a day as needed pain 13)  Symbicort 80-4.5 Mcg/act Aero (Budesonide-formoterol fumarate) .... 2 puffs inhaled two times a day 14)  Ondansetron 4 Mg Tbdp (Ondansetron) .Marland Kitchen.. 1 by mouth q 8 hours as needed nausea 15)  Fish Oil 1000 Mg Caps (Omega-3 fatty acids) .Marland Kitchen.. 1 tab by mouth two times a day 16)  Tussionex Pennkinetic Er 8-10 Mg/59ml Lqcr (Chlorpheniramine-hydrocodone) .... 5 ml by mouth at bedtime as needed cough 17)  Azithromycin 250 Mg Tabs (Azithromycin) .... 2 by  mouth today and then 1 daily for 4 days  Patient Instructions: 1)  Take antibiotic as directed.  Drink lots of fluids.  Treat sympotmatically with Mucinex, nasal saline irrigation, and Tylenol/Ibuprofen. Also try claritin D or zyrtec D over the counter- two times a day as needed ( have to sign for them at pharmacy). You can use warm compresses.  Cough  suppressant at night. Call if not improving as expected in 5-7 days.  Prescriptions: AZITHROMYCIN 250 MG  TABS (AZITHROMYCIN) 2 by  mouth today and then 1 daily for 4 days  #6 x 0   Entered and Authorized by:   Ruthe Mannan MD   Signed by:   Ruthe Mannan MD on 09/27/2010   Method used:   Print then Give to Patient   RxID:   [id number] [facility] ER 8-10 MG/5ML LQCR (CHLORPHENIRAMINE-HYDROCODONE) 5 ml by mouth at bedtime as needed cough  #5 ounces x 0   Entered and Authorized by:   Ruthe Mannan MD   Signed by:   Ruthe Mannan MD on 09/27/2010   Method used:   Print then Give to Patient   RxID:   [id number]    Orders Added: 1)  Est. Patient Level III [84696]    Current Allergies (reviewed today): ! DICLOFENAC SODIUM (DICLOFENAC SODIUM) ! ASPIRIN ! * ANSAIDS ! ADVAIR HFA (FLUTICASONE-SALMETEROL) PENICILLIN G POTASSIUM (PENICILLIN G POTASSIUM)

## 2010-12-05 NOTE — Progress Notes (Signed)
Summary: Alprazolam  Phone Note Refill Request Message from:  Fax from Pharmacy on April 20, 2010 4:45 PM  Refills Requested: Medication #1:  ALPRAZOLAM 0.5 MG TABS 1 by mouth three times a day as needed anxiety  Medication #2:  IBUPROFEN 800 MG TABS 1 tab by mouth three times a day as needed pain [facility] [address]  Phone:   [phone]   Method Requested: Telephone to Pharmacy Initial call taken by: Delilah Shan CMA [facility]),  April 20, 2010 4:46 PM  Follow-up for Phone Call        Rx called to pharmacy Follow-up by: Benny Lennert CMA [facility]),  April 21, 2010 7:58 AM    Prescriptions: IBUPROFEN 800 MG TABS (IBUPROFEN) 1 tab by mouth three times a day as needed pain  #60 x 3   Entered and Authorized by:   Kerby Nora MD   Signed by:   Kerby Nora MD on 04/20/2010   Method used:   Telephoned to ...       [facility] [address] [phone]* (retail)       [address]       [place], [place]  [address]       Ph: [phone] or [phone]       Fax: [phone]   RxID:   [id number] ALPRAZOLAM 0.5 MG TABS (ALPRAZOLAM) 1 by mouth three times a day as needed anxiety  #90 x 0   Entered and Authorized by:   Kerby Nora MD   Signed by:   Kerby Nora MD on 04/20/2010   Method used:   Telephoned to ...       [facility] [address] [phone]* (retail)       [address]       [place], [place]  [address]       Ph: [phone] or [phone]       Fax: [phone]   RxID:   [id number]

## 2010-12-05 NOTE — Progress Notes (Signed)
Summary: alprazolam   Phone Note Refill Request Message from:  Fax from Pharmacy on October 09, 2010 10:10 AM  Refills Requested: Medication #1:  ALPRAZOLAM 0.5 MG TABS 1 by mouth three times a day as needed anxiety   Last Refilled: 28-Sep-1960 Refill request from [employer]. [phone].   Initial call taken by: Melody Comas,  October 09, 2010 10:11 AM  Follow-up for Phone Call        Rx called to pharmacy Follow-up by: Benny Lennert CMA [facility]),  October 10, 2010 8:18 AM    Prescriptions: ALPRAZOLAM 0.5 MG TABS (ALPRAZOLAM) 1 by mouth three times a day as needed anxiety  #90 x 0   Entered and Authorized by:   Kerby Nora MD   Signed by:   Kerby Nora MD on 10/10/2010   Method used:   Telephoned to ...       [facility] [address] [phone]* (retail)       [address]       [place], [place]  [address]       Ph: [phone] or [phone]       Fax: [phone]   RxID:   [id number]

## 2010-12-05 NOTE — Letter (Signed)
Summary: Results Follow up Letter  [place] [facility]  [address] [place], [place] [address]   Phone: [phone]  Fax: [phone]    03/27/2010 MRN: [id number]     [facility] [place] [address] [place], [place]  [address]    Dear Ms. Dever,  The following are the results of your recent test(s):  Test         Result    Pap Smear:        Normal _____  Not Normal _____ Comments: ______________________________________________________ Cholesterol: LDL(Bad cholesterol):         Your goal is less than:         HDL (Good cholesterol):       Your goal is more than: Comments:  ______________________________________________________ Mammogram:        Normal __x___  Not Normal _____ Comments:Repeat in 1 year  ___________________________________________________________________ Hemoccult:        Normal _____  Not normal _______ Comments:    _____________________________________________________________________ Other Tests:    We routinely do not discuss normal results over the telephone.  If you desire a copy of the results, or you have any questions about this information we can discuss them at your next office visit.   Sincerely,   Kerby Nora MD

## 2010-12-05 NOTE — Assessment & Plan Note (Signed)
Summary: 3 m f/u 30 min/dlo   Vital Signs:  Patient profile:   50 year old female Height:      60 inches Weight:      223.0 pounds BMI:     43.71 Temp:     97.6 degrees F oral Pulse rate:   76 / minute Pulse rhythm:   regular BP sitting:   120 / 76  (left arm) Cuff size:   large  Vitals Entered By: Benny Lennert CMA Duncan Dull) (October 03, 2010 3:24 PM)  History of Present Illness: Chief complaint 3 month follow up   DM, improved A1C since last check. 7.4 to 7...still room for more improvement.  No weight loss since last OV.  BP at goal <130/80.  Working on improved diet.  Tryng to restart  exercsie...has not been able to walk so has now bouhgt a exercycle.  No personal hx or family hx of bladder cancer.  Sinus infection ..improved on Z-pack.Marland Kitchen and on tussionex.  Body pain from fibromyalgia.Marland Kitchendoing fairly well.. Using ibuprofen  800mg  daily (no stomach upset) ... using tramadol  only every few days...still has  full 2/3 thirds of a bottle since 07/2010 Uses muslce relaxant every night and amitryptiline.   Hand swollen and numb.. when wakes up in AMs.Marland Kitchenlies on left side     Problems Prior to Update: 1)  Acute Frontal Sinusitis  (ICD-461.1) 2)  Dysuria  (ICD-788.1) 3)  Asthma, Extrinsic, With Acute Exacerbation  (ICD-493.02) 4)  Routine Gynecological Examination  (ICD-V72.31) 5)  Well Woman  (ICD-V70.0) 6)  Other Screening Mammogram  (ICD-V76.12) 7)  Back Pain, Chronic  (ICD-724.5) 8)  Hematochezia  (ICD-578.1) 9)  Osteoarthritis  (ICD-715.90) 10)  Renal Calculus  (ICD-592.0) 11)  Anxiety  (ICD-300.00) 12)  Tobacco Abuse  (ICD-305.1) 13)  Sleep Apnea  (ICD-780.57) 14)  Hyperlipidemia  (ICD-272.4) 15)  Gerd  (ICD-530.81) 16)  Diabetes Mellitus, Type II  (ICD-250.00) 17)  Depression  (ICD-311) 18)  Allergic Rhinitis  (ICD-477.9) 19)  Fibromyalgia  (ICD-729.1)  Current Medications (verified): 1)  Lopid 600 Mg Tabs (Gemfibrozil) .... Take 1 Tablet By Mouth Twice A  Day 2)  Glipizide 10 Mg Tabs (Glipizide) .... Take 1 Tablet By Mouth Daily 3)  Amitriptyline Hcl 50 Mg Tabs (Amitriptyline Hcl) .... Take 1 Tablet By Mouth Every Night 4)  Cyclobenzaprine Hcl 10 Mg Tabs (Cyclobenzaprine Hcl) .... Take 1 Tablet By Mouth At Bedtime 5)  Tramadol Hcl 50 Mg Tabs (Tramadol Hcl) .... Take 2  Tablet By Mouth Every Eight Hours As Needed 6)  Benazepril Hcl 10 Mg Tabs (Benazepril Hcl) .... Take 1 Tablet By Mouth Once A Day 7)  Metformin Hcl 1000 Mg Tabs (Metformin Hcl) .... Take 1 Tablet By Mouth Twice A Day 8)  Alprazolam 0.5 Mg Tabs (Alprazolam) .Marland Kitchen.. 1 By Mouth Three Times A Day As Needed Anxiety 9)  Proair Hfa 108 (90 Base) Mcg/act Aers (Albuterol Sulfate) .... Inhale Two Puffs Four Times A Day 10)  Actos 30 Mg  Tabs (Pioglitazone Hcl) .... Take 1 Tablet By Mouth Once A Day 11)  Eq Chlortabs 4 Mg  Tabs (Chlorpheniramine Maleate) .... As Needed 12)  Ibuprofen 800 Mg Tabs (Ibuprofen) .Marland Kitchen.. 1 Tab By Mouth Three Times A Day As Needed Pain 13)  Symbicort 80-4.5 Mcg/act Aero (Budesonide-Formoterol Fumarate) .... 2 Puffs Inhaled Two Times A Day 14)  Ondansetron 4 Mg Tbdp (Ondansetron) .Marland Kitchen.. 1 By Mouth Q 8 Hours As Needed Nausea 15)  Fish Oil 1000 Mg Caps (Omega-3  Fatty Acids) .Marland Kitchen.. 1 Tab By Mouth Two Times A Day 16)  Tussionex Pennkinetic Er 8-10 Mg/70ml Lqcr (Chlorpheniramine-Hydrocodone) .... 5 Ml By Mouth At Bedtime As Needed Cough  Allergies: 1)  ! Diclofenac Sodium (Diclofenac Sodium) 2)  ! Aspirin 3)  ! * Ansaids 4)  ! Advair Hfa (Fluticasone-Salmeterol) 5)  Penicillin G Potassium (Penicillin G Potassium)  Past History:  Past medical, surgical, family and social histories (including risk factors) reviewed, and no changes noted (except as noted below).  Past Medical History: Reviewed history from 03/12/2007 and no changes required. Allergic rhinitis Asthma Depression Diabetes mellitus, type II GERD Hyperlipidemia  Past Surgical History: Reviewed history from  06/24/2007 and no changes required. Appendectomy 1973 Cholecystectomy 08/2006  Family History: Reviewed history from 06/24/2007 and no changes required. Father: 25 Stroke in 04/03/87 Mother: Died 51 tubal pregnancy Siblings: None BP:  (+) DM:  (+) Arthritis:  (+) CA:  Uncle, lung CA  Social History: Reviewed history from 06/24/2007 and no changes required. Current Smoker  ~ 1 PPD Alcohol use-no Drug use-no Marital Status: Married Children:  Occupation: Housewife  Review of Systems General:  Complains of fatigue; denies fever. CV:  Denies chest pain or discomfort. Resp:  Denies shortness of breath. GI:  Denies abdominal pain. GU:  Denies dysuria.  Physical Exam  General:  obese appearing female inNAD  Eyes:  No corneal or conjunctival inflammation noted. EOMI. Perrla. Funduscopic exam benign, without hemorrhages, exudates or papilledema. Vision grossly normal. Ears:  External ear exam shows no significant lesions or deformities.  Otoscopic examination reveals clear canals, tympanic membranes are intact bilaterally without bulging, retraction, inflammation or discharge. Hearing is grossly normal bilaterally. Nose:  External nasal examination shows no deformity or inflammation. Nasal mucosa are pink and moist without lesions or exudates. Mouth:  MMM Neck:  no carotid bruit or thyromegaly no cervical or supraclavicular lymphadenopathy  Lungs:  Normal respiratory effort, chest expands symmetrically. Lungs are clear to auscultation, no crackles or wheezes. Heart:  Normal rate and regular rhythm. S1 and S2 normal without gallop, murmur, click, rub or other extra sounds. Pulses:  R and L posterior tibial pulses are full and equal bilaterally  Extremities:  no edema   Diabetes Management Exam:    Foot Exam (with socks and/or shoes not present):       Sensory-Pinprick/Light touch:          Left medial foot (L-4): normal          Left dorsal foot (L-5): normal          Left lateral  foot (S-1): normal          Right medial foot (L-4): normal          Right dorsal foot (L-5): normal          Right lateral foot (S-1): normal       Sensory-Monofilament:          Left foot: normal          Right foot: normal       Inspection:          Left foot: normal          Right foot: normal       Nails:          Left foot: normal          Right foot: normal   Impression & Recommendations:  Problem # 1:  DIABETES MELLITUS, TYPE II (ICD-250.00) Improved control..COntinue working on weight  loss.. COntinue metformin and acots for now. She will look into wheter Byetta or victoza may be an option for her ..in past was not able to use Byetta due to cost. Encouraged exercise, weight loss, healthy eating habits.  On ACEI.  Her updated medication list for this problem includes:    Glipizide 10 Mg Tabs (Glipizide) .Marland Kitchen... Take 1 tablet by mouth daily    Benazepril Hcl 10 Mg Tabs (Benazepril hcl) .Marland Kitchen... Take 1 tablet by mouth once a day    Metformin Hcl 1000 Mg Tabs (Metformin hcl) .Marland Kitchen... Take 1 tablet by mouth twice a day    Actos 30 Mg Tabs (Pioglitazone hcl) .Marland Kitchen... Take 1 tablet by mouth once a day  Problem # 2:  FIBROMYALGIA (ICD-729.1) Stable on current meds.  Refill amitry[tiline.  Her updated medication list for this problem includes:    Cyclobenzaprine Hcl 10 Mg Tabs (Cyclobenzaprine hcl) .Marland Kitchen... Take 1 tablet by mouth at bedtime    Tramadol Hcl 50 Mg Tabs (Tramadol hcl) .Marland Kitchen... Take 2  tablet by mouth every eight hours as needed    Ibuprofen 800 Mg Tabs (Ibuprofen) .Marland Kitchen... 1 tab by mouth three times a day as needed pain  Complete Medication List: 1)  Lopid 600 Mg Tabs (Gemfibrozil) .... Take 1 tablet by mouth twice a day 2)  Glipizide 10 Mg Tabs (Glipizide) .... Take 1 tablet by mouth daily 3)  Amitriptyline Hcl 50 Mg Tabs (Amitriptyline hcl) .... Take 1 tablet by mouth every night 4)  Cyclobenzaprine Hcl 10 Mg Tabs (Cyclobenzaprine hcl) .... Take 1 tablet by mouth at bedtime 5)   Tramadol Hcl 50 Mg Tabs (Tramadol hcl) .... Take 2  tablet by mouth every eight hours as needed 6)  Benazepril Hcl 10 Mg Tabs (Benazepril hcl) .... Take 1 tablet by mouth once a day 7)  Metformin Hcl 1000 Mg Tabs (Metformin hcl) .... Take 1 tablet by mouth twice a day 8)  Alprazolam 0.5 Mg Tabs (Alprazolam) .Marland Kitchen.. 1 by mouth three times a day as needed anxiety 9)  Proair Hfa 108 (90 Base) Mcg/act Aers (Albuterol sulfate) .... Inhale two puffs four times a day 10)  Actos 30 Mg Tabs (Pioglitazone hcl) .... Take 1 tablet by mouth once a day 11)  Eq Chlortabs 4 Mg Tabs (Chlorpheniramine maleate) .... As needed 12)  Ibuprofen 800 Mg Tabs (Ibuprofen) .Marland Kitchen.. 1 tab by mouth three times a day as needed pain 13)  Symbicort 80-4.5 Mcg/act Aero (Budesonide-formoterol fumarate) .... 2 puffs inhaled two times a day 14)  Ondansetron 4 Mg Tbdp (Ondansetron) .Marland Kitchen.. 1 by mouth q 8 hours as needed nausea 15)  Fish Oil 1000 Mg Caps (Omega-3 fatty acids) .Marland Kitchen.. 1 tab by mouth two times a day 16)  Tussionex Pennkinetic Er 8-10 Mg/7ml Lqcr (Chlorpheniramine-hydrocodone) .... 5 ml by mouth at bedtime as needed cough  Patient Instructions: 1)  Please schedule a follow-up appointment in 3 months .  2)  BMP prior to visit, ICD-9: 250.00 3)  Hepatic Panel prior to visit ICD-9:  4)  Lipid panel prior to visit ICD-9 :  5)  HgBA1c prior to visit  ICD-9:  6)  Urine Microalbumin prior to visit ICD-9 :  7)  Look into Byetta or victoza for insurance coverage. Prescriptions: AMITRIPTYLINE HCL 50 MG TABS (AMITRIPTYLINE HCL) Take 1 tablet by mouth every night  #30 x 11   Entered and Authorized by:   Kerby Nora MD   Signed by:   Kerby Nora MD on 10/03/2010  Method used:   [facility]        [facility] [address] [phone]* (retail)       [address]       [place], [place]  [address]       Ph: [phone] or [phone]       Fax: [phone]   RxID:   [id number]    Orders Added: 1)  Est.  Patient Level IV [36644]    Current Allergies (reviewed today): ! DICLOFENAC SODIUM (DICLOFENAC SODIUM) ! ASPIRIN ! * ANSAIDS ! ADVAIR HFA (FLUTICASONE-SALMETEROL) PENICILLIN G POTASSIUM (PENICILLIN G POTASSIUM)  Last Flu Vaccine:  Fluvax 3+ (09/22/2009 3:11:07 PM) Flu Vaccine Result Date:  10/03/2010 Flu Vaccine Result:  given Flu Vaccine Next Due:  1 yr  Appended Document: 3 m f/u 30 min/dlo     Allergies: 1)  ! Diclofenac Sodium (Diclofenac Sodium) 2)  ! Aspirin 3)  ! * Ansaids 4)  ! Advair Hfa (Fluticasone-Salmeterol) 5)  Penicillin G Potassium (Penicillin G Potassium)   Complete Medication List: 1)  Lopid 600 Mg Tabs (Gemfibrozil) .... Take 1 tablet by mouth twice a day 2)  Glipizide 10 Mg Tabs (Glipizide) .... Take 1 tablet by mouth daily 3)  Amitriptyline Hcl 50 Mg Tabs (Amitriptyline hcl) .... Take 1 tablet by mouth every night 4)  Cyclobenzaprine Hcl 10 Mg Tabs (Cyclobenzaprine hcl) .... Take 1 tablet by mouth at bedtime 5)  Tramadol Hcl 50 Mg Tabs (Tramadol hcl) .... Take 2  tablet by mouth every eight hours as needed 6)  Benazepril Hcl 10 Mg Tabs (Benazepril hcl) .... Take 1 tablet by mouth once a day 7)  Metformin Hcl 1000 Mg Tabs (Metformin hcl) .... Take 1 tablet by mouth twice a day 8)  Alprazolam 0.5 Mg Tabs (Alprazolam) .Marland Kitchen.. 1 by mouth three times a day as needed anxiety 9)  Proair Hfa 108 (90 Base) Mcg/act Aers (Albuterol sulfate) .... Inhale two puffs four times a day 10)  Actos 30 Mg Tabs (Pioglitazone hcl) .... Take 1 tablet by mouth once a day 11)  Eq Chlortabs 4 Mg Tabs (Chlorpheniramine maleate) .... As needed 12)  Ibuprofen 800 Mg Tabs (Ibuprofen) .Marland Kitchen.. 1 tab by mouth three times a day as needed pain 13)  Symbicort 80-4.5 Mcg/act Aero (Budesonide-formoterol fumarate) .... 2 puffs inhaled two times a day 14)  Ondansetron 4 Mg Tbdp (Ondansetron) .Marland Kitchen.. 1 by mouth q 8 hours as needed nausea 15)  Fish Oil 1000 Mg Caps (Omega-3 fatty acids) .Marland Kitchen.. 1 tab by  mouth two times a day 16)  Tussionex Pennkinetic Er 8-10 Mg/4ml Lqcr (Chlorpheniramine-hydrocodone) .... 5 ml by mouth at bedtime as needed cough  Other Orders: Flu Vaccine 66yrs + MEDICARE PATIENTS (I3474) Administration Flu vaccine - MCR (Q5956) Pneumococcal Vaccine (38756) Admin 1st Vaccine (43329)   Orders Added: 1)  Flu Vaccine 28yrs + MEDICARE PATIENTS [Q2039] 2)  Administration Flu vaccine - MCR [G0008] 3)  Pneumococcal Vaccine [90732] 4)  Admin 1st Vaccine [51884]   Immunizations Administered:  Pneumonia Vaccine:    Vaccine Type: Pneumovax    Site: right deltoid    Mfr: Merck    Dose: 0.5 ml    Route: IM    Given by: Linde Gillis CMA (AAMA)    Exp. Date: 03/01/2012    Lot #: [id number]    VIS given: 10/10/09 version given October 03, 2010.  Flu Vaccine Consent Questions     Do you have a  history of severe allergic reactions to this vaccine? no    Any prior history of allergic reactions to egg and/or gelatin? no    Do you have a sensitivity to the preservative Thimersol? no    Do you have a past history of Guillan-Barre Syndrome? no    Do you currently have an acute febrile illness? no    Have you ever had a severe reaction to latex? no    Vaccine information given and explained to patient? yes    Are you currently pregnant? no    Lot Number:[id number]   Exp Date:05/05/2011   Site Given  Left Deltoid IM  Immunizations Administered:  Pneumonia Vaccine:    Vaccine Type: Pneumovax    Site: right deltoid    Mfr: Merck    Dose: 0.5 ml    Route: IM    Given by: Linde Gillis CMA (AAMA)    Exp. Date: 03/01/2012    Lot #: [id number]    VIS given: 10/10/09 version given October 03, 2010.

## 2010-12-05 NOTE — Progress Notes (Signed)
Summary: refill request for alprazolam  Phone Note Refill Request Message from:  Fax from Pharmacy  Refills Requested: Medication #1:  ALPRAZOLAM 0.5 MG TABS 1 by mouth three times a day as needed anxiety   Last Refilled: 01/25/2010 Faxed request from [facility], phone [phone].  Initial call taken by: Lowella Petties CMA,  February 22, 2010 12:44 PM  Follow-up for Phone Call        Rx called to pharmacy Follow-up by: Benny Lennert CMA Duncan Dull),  February 23, 2010 7:37 AM    Prescriptions: ALPRAZOLAM 0.5 MG TABS (ALPRAZOLAM) 1 by mouth three times a day as needed anxiety  #90 x 0   Entered and Authorized by:   Kerby Nora MD   Signed by:   Kerby Nora MD on 02/22/2010   Method used:   Telephoned to ...       [facility] [address] [phone]* (retail)       [address]       [place], [place]  [address]       Ph: [phone] or [phone]       Fax: [phone]   RxID:   [id number]

## 2010-12-05 NOTE — Progress Notes (Signed)
Summary: refill request for tramadol  Phone Note Refill Request Message from:  Fax from Pharmacy  Refills Requested: Medication #1:  TRAMADOL HCL 50 MG TABS Take 2  tablet by mouth every eight hours as needed   Last Refilled: 02/14/2010 [facility], [phone].  Initial call taken by: Lowella Petties CMA,  April 26, 2010 3:02 PM    Prescriptions: TRAMADOL HCL 50 MG TABS (TRAMADOL HCL) Take 2  tablet by mouth every eight hours as needed  #180 Tablet x 0   Entered and Authorized by:   Hannah Beat MD   Signed by:   Hannah Beat MD on 04/26/2010   Method used:   Electronically to        [facility] [address] [phone]* (retail)       [address]       [place], [place]  [address]       Ph: [phone] or [phone]       Fax: [phone]   RxID:   [id number]

## 2010-12-05 NOTE — Progress Notes (Signed)
Summary: refill requests for flexeril, ibuprofen, xanax  Phone Note Refill Request Message from:  Fax from Pharmacy  Refills Requested: Medication #1:  CYCLOBENZAPRINE HCL 10 MG TABS Take 1 tablet by mouth at bedtime   Last Refilled: 08/09/2010  Medication #2:  ALPRAZOLAM 0.5 MG TABS 1 by mouth three times a day as needed anxiety   Last Refilled: 08/10/2010  Medication #3:  IBUPROFEN 800 MG TABS 1 tab by mouth three times a day as needed pain   Last Refilled: 07/17/2010 [facility]  Initial call taken by: Lowella Petties CMA, AAMA,  September 08, 2010 2:48 PM  Follow-up for Phone Call        Rx called to pharmacy Follow-up by: Benny Lennert CMA Duncan Dull),  September 08, 2010 3:14 PM    Prescriptions: IBUPROFEN 800 MG TABS (IBUPROFEN) 1 tab by mouth three times a day as needed pain  #60 x 3   Entered and Authorized by:   Kerby Nora MD   Signed by:   Kerby Nora MD on 09/08/2010   Method used:   Telephoned to ...       [facility] [address] [phone]* (retail)       [address]       [place], [place]  [address]       Ph: [phone] or [phone]       Fax: [phone]   RxID:   [id number] ALPRAZOLAM 0.5 MG TABS (ALPRAZOLAM) 1 by mouth three times a day as needed anxiety  #90 x 0   Entered and Authorized by:   Kerby Nora MD   Signed by:   Kerby Nora MD on 09/08/2010   Method used:   Telephoned to ...       [facility] [address] [phone]* (retail)       [address]       [place], [place]  [address]       Ph: [phone] or [phone]       Fax: [phone]   RxID:   [id number] CYCLOBENZAPRINE HCL 10 MG TABS (CYCLOBENZAPRINE HCL) Take 1 tablet by mouth at bedtime  #30 x 2   Entered and Authorized by:   Kerby Nora MD   Signed by:   Kerby Nora MD on 09/08/2010   Method used:   Telephoned to ...       [facility] [address] [phone]* (retail)       [address]       [place], [place]  [address]       Ph: [phone] or [phone]       Fax: [phone]   RxID:   [id number]

## 2010-12-05 NOTE — Assessment & Plan Note (Signed)
Summary: cpx/dlo   Vital Signs:  Patient profile:   50 year old female Height:      60 inches Weight:      221.25 pounds BMI:     43.37 Temp:     98.2 degrees F oral Pulse rate:   80 / minute Pulse rhythm:   regular BP sitting:   124 / 80  (left arm) Cuff size:   large  Vitals Entered By: Linde Gillis CMA Duncan Dull) (Mar 15, 2010 2:46 PM) CC: complete physical exam, Lipid Management   History of Present Illness: 8 more lbs weight loss, walking every day.  Depression, stabe..still issues with husband..planning on seperation.  Lipid Management History:      Positive NCEP/ATP III risk factors include diabetes, HDL cholesterol less than 40, and current tobacco user.  Negative NCEP/ATP III risk factors include female age less than 15 years old.        Adjunctive measures started by the patient include aerobic exercise, fiber, omega-3 supplements, sitostanol esters, limit alcohol consumpton, and weight reduction.  Comments include: HAs been eating a lot of shrimp and butter or popcorn shrimp lately.     Problems Prior to Update: 1)  Uti  (ICD-599.0) 2)  Asthma, Extrinsic, With Acute Exacerbation  (ICD-493.02) 3)  Routine Gynecological Examination  (ICD-V72.31) 4)  Well Woman  (ICD-V70.0) 5)  Other Screening Mammogram  (ICD-V76.12) 6)  Back Pain, Chronic  (ICD-724.5) 7)  Hematochezia  (ICD-578.1) 8)  Back Strain, Lumbar  (ICD-847.2) 9)  Osteoarthritis  (ICD-715.90) 10)  Renal Calculus  (ICD-592.0) 11)  Anxiety  (ICD-300.00) 12)  Tobacco Abuse  (ICD-305.1) 13)  Sleep Apnea  (ICD-780.57) 14)  Hyperlipidemia  (ICD-272.4) 15)  Gerd  (ICD-530.81) 16)  Diabetes Mellitus, Type II  (ICD-250.00) 17)  Depression  (ICD-311) 18)  Allergic Rhinitis  (ICD-477.9) 19)  Fibromyalgia  (ICD-729.1)  Current Medications (verified): 1)  Lopid 600 Mg Tabs (Gemfibrozil) .... Take 1 Tablet By Mouth Twice A Day 2)  Glipizide 10 Mg Tabs (Glipizide) .... Take 1 Tablet By Mouth Daily 3)  Amitriptyline  Hcl 50 Mg Tabs (Amitriptyline Hcl) .... Take 1 Tablet By Mouth Every Night 4)  Cyclobenzaprine Hcl 10 Mg Tabs (Cyclobenzaprine Hcl) .... Take 1 Tablet By Mouth At Bedtime 5)  Tramadol Hcl 50 Mg Tabs (Tramadol Hcl) .... Take 2  Tablet By Mouth Every Eight Hours As Needed 6)  Benazepril Hcl 10 Mg Tabs (Benazepril Hcl) .... Take 1 Tablet By Mouth Once A Day 7)  Metformin Hcl 1000 Mg Tabs (Metformin Hcl) .... Take 1 Tablet By Mouth Twice A Day 8)  Alprazolam 0.5 Mg Tabs (Alprazolam) .Marland Kitchen.. 1 By Mouth Three Times A Day As Needed Anxiety 9)  Proair Hfa 108 (90 Base) Mcg/act Aers (Albuterol Sulfate) .... Inhale Two Puffs Four Times A Day 10)  Actos 30 Mg  Tabs (Pioglitazone Hcl) .... Take 1 Tablet By Mouth Once A Day 11)  Eq Chlortabs 4 Mg  Tabs (Chlorpheniramine Maleate) .... As Needed 12)  Ibuprofen 800 Mg Tabs (Ibuprofen) .Marland Kitchen.. 1 Tab By Mouth Three Times A Day As Needed Pain 13)  Symbicort 80-4.5 Mcg/act Aero (Budesonide-Formoterol Fumarate) .... 2 Puffs Inhaled Two Times A Day 14)  Ciprofloxacin Hcl 500 Mg Tabs (Ciprofloxacin Hcl) .Marland Kitchen.. 1 By Mouth Two Times A Day 15)  Ondansetron 4 Mg Tbdp (Ondansetron) .Marland Kitchen.. 1 By Mouth Q 8 Hours As Needed Nausea 16)  Fish Oil 1000 Mg Caps (Omega-3 Fatty Acids) .Marland Kitchen.. 1 Tab By Mouth Two Times A  Day  Allergies: 1)  ! Diclofenac Sodium (Diclofenac Sodium) 2)  ! Aspirin 3)  ! * Ansaids 4)  ! Advair Hfa (Fluticasone-Salmeterol) 5)  Penicillin G Potassium (Penicillin G Potassium)  Past History:  Past medical, surgical, family and social histories (including risk factors) reviewed, and no changes noted (except as noted below).  Past Medical History: Reviewed history from 03/12/2007 and no changes required. Allergic rhinitis Asthma Depression Diabetes mellitus, type II GERD Hyperlipidemia  Past Surgical History: Reviewed history from 06/24/2007 and no changes required. Appendectomy 1973 Cholecystectomy 08/2006  Family History: Reviewed history from 06/24/2007  and no changes required. Father: 90 Stroke in 1987-04-09 Mother: Died 36 tubal pregnancy Siblings: None BP:  (+) DM:  (+) Arthritis:  (+) CA:  Uncle, lung CA  Social History: Reviewed history from 06/24/2007 and no changes required. Current Smoker  ~ 1 PPD Alcohol use-no Drug use-no Marital Status: Married Children:  Occupation: Housewife  Review of Systems       Some changes in [place]..missing period..she feels she is perimenopausal... no hot flashes, some mood swings. General:  Denies fatigue and fever. CV:  Denies chest pain or discomfort. Resp:  Denies shortness of breath. GI:  Denies abdominal pain. GU:  Denies dysuria.  Physical Exam  General:  Obese appearing female in NAd Ears:  External ear exam shows no significant lesions or deformities.  Otoscopic examination reveals clear canals, tympanic membranes are intact bilaterally without bulging, retraction, inflammation or discharge. Hearing is grossly normal bilaterally. Nose:  External nasal examination shows no deformity or inflammation. Nasal mucosa are pink and moist without lesions or exudates. Mouth:  MMM Neck:  .nekc no cervical or supraclavicular lymphadenopathy  Chest Wall:  No deformities, masses, or tenderness noted. Breasts:  No mass, nodules, thickening, tenderness, bulging, retraction, inflamation, nipple discharge or skin changes noted.   Lungs:  Normal respiratory effort, chest expands symmetrically. Lungs are clear to auscultation, no crackles or wheezes. Heart:  Normal rate and regular rhythm. S1 and S2 normal without gallop, murmur, click, rub or other extra sounds. Abdomen:  Bowel sounds positive,abdomen soft and non-tender without masses, organomegaly or hernias noted. Genitalia:  normal introitus, no external lesions, no vaginal discharge, mucosa pink and moist, normal uterus size and position, and no adnexal masses or tenderness.   Pulses:  R and L posterior tibial pulses are full and equal bilaterally    Extremities:  no edema   Diabetes Management Exam:    Foot Exam (with socks and/or shoes not present):       Sensory-Pinprick/Light touch:          Left medial foot (L-4): normal          Left dorsal foot (L-5): normal          Left lateral foot (S-1): normal          Right medial foot (L-4): normal          Right dorsal foot (L-5): normal          Right lateral foot (S-1): normal       Sensory-Monofilament:          Left foot: normal          Right foot: normal       Inspection:          Left foot: normal          Right foot: normal       Nails:  Left foot: normal          Right foot: normal   Impression & Recommendations:  Problem # 1:  Preventive Health Care (ICD-V70.0) The patient's preventative maintenance and recommended screening tests for an annual wellness exam were reviewed in full today. Brought up to date unless services declined.  Counselled on the importance of diet, exercise, and its role in overall health and mortality. The patient's FH and SH was reviewed, including their home life, tobacco status, and drug and alcohol status.     Problem # 2:  Gynecological examination-routine (ICD-V72.31) DVE nml. No pap.Marland KitchenMarland Kitchenpap q2-3 years.  Problem # 3:  ANXIETY (ICD-300.00) Stable on current meds.  Her updated medication list for this problem includes:    Amitriptyline Hcl 50 Mg Tabs (Amitriptyline hcl) .Marland Kitchen... Take 1 tablet by mouth every night    Alprazolam 0.5 Mg Tabs (Alprazolam) .Marland Kitchen... 1 by mouth three times a day as needed anxiety  Problem # 4:  HYPERLIPIDEMIA (ICD-272.4) Inadequate control of LDL.Marland Kitchennot on stain. Stop shellfish...gave info on diet.  recheck in 3 months Her updated medication list for this problem includes:    Lopid 600 Mg Tabs (Gemfibrozil) .Marland Kitchen... Take 1 tablet by mouth twice a day  Labs Reviewed: SGOT: 38 (03/08/2010)   SGPT: 28 (03/08/2010)  Lipid Goals: Chol Goal: 200 (07/15/2007)   HDL Goal: 40 (07/15/2007)   LDL Goal: 100  (07/15/2007)   TG Goal: 150 (07/15/2007)  10 Yr Risk Heart Disease: 13 % Prior 10 Yr Risk Heart Disease: 9 % (10/23/2007)   HDL:37.70 (03/08/2010), 30.90 (09/26/2009)  LDL:115 (03/08/2010), 52 (16/08/9603)  Chol:167 (03/08/2010), 125 (09/26/2009)  Trig:73.0 (03/08/2010), 213.0 (09/26/2009)  Problem # 5:  DIABETES MELLITUS, TYPE II (ICD-250.00) Worsenied control..discuassed diet and continued weight loss. recheck in 3 months.  Her updated medication list for this problem includes:    Glipizide 10 Mg Tabs (Glipizide) .Marland Kitchen... Take 1 tablet by mouth daily    Benazepril Hcl 10 Mg Tabs (Benazepril hcl) .Marland Kitchen... Take 1 tablet by mouth once a day    Metformin Hcl 1000 Mg Tabs (Metformin hcl) .Marland Kitchen... Take 1 tablet by mouth twice a day    Actos 30 Mg Tabs (Pioglitazone hcl) .Marland Kitchen... Take 1 tablet by mouth once a day  Complete Medication List: 1)  Lopid 600 Mg Tabs (Gemfibrozil) .... Take 1 tablet by mouth twice a day 2)  Glipizide 10 Mg Tabs (Glipizide) .... Take 1 tablet by mouth daily 3)  Amitriptyline Hcl 50 Mg Tabs (Amitriptyline hcl) .... Take 1 tablet by mouth every night 4)  Cyclobenzaprine Hcl 10 Mg Tabs (Cyclobenzaprine hcl) .... Take 1 tablet by mouth at bedtime 5)  Tramadol Hcl 50 Mg Tabs (Tramadol hcl) .... Take 2  tablet by mouth every eight hours as needed 6)  Benazepril Hcl 10 Mg Tabs (Benazepril hcl) .... Take 1 tablet by mouth once a day 7)  Metformin Hcl 1000 Mg Tabs (Metformin hcl) .... Take 1 tablet by mouth twice a day 8)  Alprazolam 0.5 Mg Tabs (Alprazolam) .Marland Kitchen.. 1 by mouth three times a day as needed anxiety 9)  Proair Hfa 108 (90 Base) Mcg/act Aers (Albuterol sulfate) .... Inhale two puffs four times a day 10)  Actos 30 Mg Tabs (Pioglitazone hcl) .... Take 1 tablet by mouth once a day 11)  Eq Chlortabs 4 Mg Tabs (Chlorpheniramine maleate) .... As needed 12)  Ibuprofen 800 Mg Tabs (Ibuprofen) .Marland Kitchen.. 1 tab by mouth three times a day as needed pain 13)  Symbicort 80-4.5  Mcg/act Aero  (Budesonide-formoterol fumarate) .... 2 puffs inhaled two times a day 14)  Ciprofloxacin Hcl 500 Mg Tabs (Ciprofloxacin hcl) .Marland Kitchen.. 1 by mouth two times a day 15)  Ondansetron 4 Mg Tbdp (Ondansetron) .Marland Kitchen.. 1 by mouth q 8 hours as needed nausea 16)  Fish Oil 1000 Mg Caps (Omega-3 fatty acids) .Marland Kitchen.. 1 tab by mouth two times a day  Other Orders: Radiology Referral (Radiology)  Lipid Assessment/Plan:      Based on NCEP/ATP III, the patient's risk factor category is "history of diabetes".  The patient's lipid goals are as follows: Total cholesterol goal is 200; LDL cholesterol goal is 100; HDL cholesterol goal is 40; Triglyceride goal is 150.  Her LDL cholesterol goal has been met.    Patient Instructions: 1)  Referral Appointment Information 2)  Day/Date: 3)  Time: 4)  Place/MD: 5)  Address: 6)  Phone/Fax: 7)  Patient given appointment information. Information/Orders faxed/mailed.  8)  Please schedule a follow-up appointment in 3 months .  9)  BMP prior to visit, ICD-9: 250.00 10)  Hepatic Panel prior to visit ICD-9:  11)  Lipid panel prior to visit ICD-9 :  12)  HgBA1c prior to visit  ICD-9:   Current Allergies (reviewed today): ! DICLOFENAC SODIUM (DICLOFENAC SODIUM) ! ASPIRIN ! * ANSAIDS ! ADVAIR HFA (FLUTICASONE-SALMETEROL) PENICILLIN G POTASSIUM (PENICILLIN G POTASSIUM)  Last PAP:  NEGATIVE FOR INTRAEPITHELIAL LESIONS OR MALIGNANCY. (02/28/2009 12:00:00 AM) PAP Next Due:  2 yr PAp every 2 years and DVE yearly.     Past Medical History:    Reviewed history from 03/12/2007 and no changes required:       Allergic rhinitis       Asthma       Depression       Diabetes mellitus, type II       GERD       Hyperlipidemia  Past Surgical History:    Reviewed history from 06/24/2007 and no changes required:       Appendectomy 1973       Cholecystectomy 08/2006

## 2010-12-05 NOTE — Progress Notes (Signed)
Summary: refill request   Phone Note Refill Request Message from:  Patient on Mar 24, 2010 10:27 AM  Refills Requested: Medication #1:  CYCLOBENZAPRINE HCL 10 MG TABS Take 1 tablet by mouth at bedtime  Medication #2:  ALPRAZOLAM 0.5 MG TABS 1 by mouth three times a day as needed anxiety [facility] [address]  Pt called back, says she only has 1 pill remaining...  Initial call taken by: Melody Comas,  Mar 24, 2010 10:27 AM  Follow-up for Phone Call        [facility] [address]. Follow-up by: Lowella Petties CMA,  Mar 24, 2010 2:45 PM    Prescriptions: ALPRAZOLAM 0.5 MG TABS (ALPRAZOLAM) 1 by mouth three times a day as needed anxiety  #90 x 0   Entered and Authorized by:   Kerby Nora MD   Signed by:   Kerby Nora MD on 03/24/2010   Method used:   Telephoned to ...       [facility] [address] [phone]* (retail)       [address]       [place], [place]  [address]       Ph: [phone] or [phone]       Fax: [phone]   RxID:   [id number] CYCLOBENZAPRINE HCL 10 MG TABS (CYCLOBENZAPRINE HCL) Take 1 tablet by mouth at bedtime  #30 x 2   Entered and Authorized by:   Kerby Nora MD   Signed by:   Kerby Nora MD on 03/24/2010   Method used:   Telephoned to ...       [facility] [address] [phone]* (retail)       [address]       [place], [place]  [address]       Ph: [phone] or [phone]       Fax: [phone]   RxID:   [id number]

## 2010-12-05 NOTE — Assessment & Plan Note (Signed)
Summary: 3 M F/U DLO   Vital Signs:  Patient profile:   50 year old female Height:      60 inches Weight:      228.8 pounds BMI:     44.85 Temp:     98.3 degrees F oral Pulse rate:   72 / minute Pulse rhythm:   regular BP sitting:   120 / 76  (left arm) Cuff size:   large  Vitals Entered By: Benny Lennert CMA [facility] Dull) (December 30, 2009 3:51 PM)  History of Present Illness: Chief complaint 3 month follow up  Has gone on a diet, has had 11 lb weight loss. Decreasing calories. Walking daily.  DM, well controlled.. FBS..118-120.  On actos, metformin, glucotrol.   Fibromyalgia well controlled on tramadol, amitryptiline, flexeril.   Anxiety, depresson, stable on alprazolam.  Continues to have issues with her husband.  Concerned about TB exposure from parents in the 54s..no cough, no SOB, no fever. She has had negative ppd several years back. Reassure pt about if no new exposures..unlikely TB present   Problems Prior to Update: 1)  Asthma, Extrinsic, With Acute Exacerbation  (ICD-493.02) 2)  Routine Gynecological Examination  (ICD-V72.31) 3)  Well Woman  (ICD-V70.0) 4)  Other Screening Mammogram  (ICD-V76.12) 5)  Back Pain, Chronic  (ICD-724.5) 6)  Hematochezia  (ICD-578.1) 7)  Back Strain, Lumbar  (ICD-847.2) 8)  Osteoarthritis  (ICD-715.90) 9)  Renal Calculus  (ICD-592.0) 10)  Anxiety  (ICD-300.00) 11)  Tobacco Abuse  (ICD-305.1) 12)  Sleep Apnea  (ICD-780.57) 13)  Hyperlipidemia  (ICD-272.4) 14)  Gerd  (ICD-530.81) 15)  Diabetes Mellitus, Type II  (ICD-250.00) 16)  Depression  (ICD-311) 17)  Allergic Rhinitis  (ICD-477.9) 18)  Fibromyalgia  (ICD-729.1)  Current Medications (verified): 1)  Lopid 600 Mg Tabs (Gemfibrozil) .... Take 1 Tablet By Mouth Twice A Day 2)  Glipizide 10 Mg Tabs (Glipizide) .... Take 1 Tablet By Mouth Daily 3)  Amitriptyline Hcl 50 Mg Tabs (Amitriptyline Hcl) .... Take 1 Tablet By Mouth Every Night 4)  Cyclobenzaprine Hcl 10 Mg Tabs  (Cyclobenzaprine Hcl) .... Take 1 Tablet By Mouth At Bedtime 5)  Tramadol Hcl 50 Mg Tabs (Tramadol Hcl) .... Take 2  Tablet By Mouth Every Eight Hours As Needed 6)  Benazepril Hcl 10 Mg Tabs (Benazepril Hcl) .... Take 1 Tablet By Mouth Once A Day 7)  Metformin Hcl 1000 Mg Tabs (Metformin Hcl) .... Take 1 Tablet By Mouth Twice A Day 8)  Alprazolam 0.5 Mg Tabs (Alprazolam) .Marland Kitchen.. 1 By Mouth Three Times A Day As Needed Anxiety 9)  Ventolin Hfa 108 (90 Base) Mcg/act  Aers (Albuterol Sulfate) .... Inhale 2 Puffs 4 Times Daily 10)  Actos 30 Mg  Tabs (Pioglitazone Hcl) .... Take 1 Tablet By Mouth Once A Day 11)  Eq Chlortabs 4 Mg  Tabs (Chlorpheniramine Maleate) .... As Needed 12)  Ibuprofen 800 Mg Tabs (Ibuprofen) .Marland Kitchen.. 1 Tab By Mouth Three Times A Day As Needed Pain 13)  Symbicort 80-4.5 Mcg/act Aero (Budesonide-Formoterol Fumarate) .... 2 Puffs Inhaled Two Times A Day  Allergies: 1)  ! Diclofenac Sodium (Diclofenac Sodium) 2)  ! Aspirin 3)  ! * Ansaids 4)  ! Advair Hfa (Fluticasone-Salmeterol) 5)  Penicillin G Potassium (Penicillin G Potassium)  Past History:  Past medical, surgical, family and social histories (including risk factors) reviewed, and no changes noted (except as noted below).  Past Medical History: Reviewed history from 03/12/2007 and no changes required. Allergic rhinitis Asthma Depression Diabetes mellitus,  type II GERD Hyperlipidemia  Past Surgical History: Reviewed history from 06/24/2007 and no changes required. Appendectomy 1973 Cholecystectomy 08/2006  Family History: Reviewed history from 06/24/2007 and no changes required. Father: 64 Stroke in 1987-03-27 Mother: Died 65 tubal pregnancy Siblings: None BP:  (+) DM:  (+) Arthritis:  (+) CA:  Uncle, lung CA  Social History: Reviewed history from 06/24/2007 and no changes required. Current Smoker  ~ 1 PPD Alcohol use-no Drug use-no Marital Status: Married Children:  Occupation: Housewife  Review of  Systems General:  Denies fatigue and fever. CV:  Denies chest pain or discomfort. Resp:  Denies shortness of breath. GI:  Denies abdominal pain. GU:  Denies dysuria.  Physical Exam  General:  obese appearing female in NAd Mouth:  MMM Neck:  no carotid bruit or thyromegaly no cervical or supraclavicular lymphadenopathy  Lungs:  Normal respiratory effort, chest expands symmetrically. Lungs are clear to auscultation, no crackles or wheezes. Heart:  Normal rate and regular rhythm. S1 and S2 normal without gallop, murmur, click, rub or other extra sounds. Pulses:  R and L posterior tibial pulses are full and equal bilaterally  Extremities:  no edmea   Diabetes Management Exam:    Foot Exam (with socks and/or shoes not present):       Sensory-Pinprick/Light touch:          Left medial foot (L-4): normal          Left dorsal foot (L-5): normal          Left lateral foot (S-1): normal          Right medial foot (L-4): normal          Right dorsal foot (L-5): normal          Right lateral foot (S-1): normal       Sensory-Monofilament:          Left foot: normal          Right foot: normal       Inspection:          Left foot: normal          Right foot: normal       Nails:          Left foot: normal          Right foot: normal   Impression & Recommendations:  Problem # 1:  DIABETES MELLITUS, TYPE II (ICD-250.00) Well controlled. Continue current medication. Encouraged continued lifestyle change.  Her updated medication list for this problem includes:    Glipizide 10 Mg Tabs (Glipizide) .Marland Kitchen... Take 1 tablet by mouth daily    Benazepril Hcl 10 Mg Tabs (Benazepril hcl) .Marland Kitchen... Take 1 tablet by mouth once a day    Metformin Hcl 1000 Mg Tabs (Metformin hcl) .Marland Kitchen... Take 1 tablet by mouth twice a day    Actos 30 Mg Tabs (Pioglitazone hcl) .Marland Kitchen... Take 1 tablet by mouth once a day  Problem # 2:  FIBROMYALGIA (ICD-729.1) Stable control onc urrent meds.  Her updated medication list for this  problem includes:    Cyclobenzaprine Hcl 10 Mg Tabs (Cyclobenzaprine hcl) .Marland Kitchen... Take 1 tablet by mouth at bedtime    Tramadol Hcl 50 Mg Tabs (Tramadol hcl) .Marland Kitchen... Take 2  tablet by mouth every eight hours as needed    Ibuprofen 800 Mg Tabs (Ibuprofen) .Marland Kitchen... 1 tab by mouth three times a day as needed pain  Problem # 3:  ANXIETY (ICD-300.00) Stable on current meds. Plans on  leaving [employer] in next year once finances are settled. She believes this will help dramatically.  Her updated medication list for this problem includes:    Amitriptyline Hcl 50 Mg Tabs (Amitriptyline hcl) .Marland Kitchen... Take 1 tablet by mouth every night    Alprazolam 0.5 Mg Tabs (Alprazolam) .Marland Kitchen... 1 by mouth three times a day as needed anxiety  Complete Medication List: 1)  Lopid 600 Mg Tabs (Gemfibrozil) .... Take 1 tablet by mouth twice a day 2)  Glipizide 10 Mg Tabs (Glipizide) .... Take 1 tablet by mouth daily 3)  Amitriptyline Hcl 50 Mg Tabs (Amitriptyline hcl) .... Take 1 tablet by mouth every night 4)  Cyclobenzaprine Hcl 10 Mg Tabs (Cyclobenzaprine hcl) .... Take 1 tablet by mouth at bedtime 5)  Tramadol Hcl 50 Mg Tabs (Tramadol hcl) .... Take 2  tablet by mouth every eight hours as needed 6)  Benazepril Hcl 10 Mg Tabs (Benazepril hcl) .... Take 1 tablet by mouth once a day 7)  Metformin Hcl 1000 Mg Tabs (Metformin hcl) .... Take 1 tablet by mouth twice a day 8)  Alprazolam 0.5 Mg Tabs (Alprazolam) .Marland Kitchen.. 1 by mouth three times a day as needed anxiety 9)  Ventolin Hfa 108 (90 Base) Mcg/act Aers (Albuterol sulfate) .... Inhale 2 puffs 4 times daily 10)  Actos 30 Mg Tabs (Pioglitazone hcl) .... Take 1 tablet by mouth once a day 11)  Eq Chlortabs 4 Mg Tabs (Chlorpheniramine maleate) .... As needed 12)  Ibuprofen 800 Mg Tabs (Ibuprofen) .Marland Kitchen.. 1 tab by mouth three times a day as needed pain 13)  Symbicort 80-4.5 Mcg/act Aero (Budesonide-formoterol fumarate) .... 2 puffs inhaled two times a day  Patient Instructions: 1)  Please  schedule a follow-up appointment in 3 months for CPX. 2)  BMP prior to visit, ICD-9: 250.00 3)  Hepatic Panel prior to visit ICD-9:  4)  Lipid panel prior to visit ICD-9 :  5)  HgBA1c prior to visit  ICD-9:  Prescriptions: IBUPROFEN 800 MG TABS (IBUPROFEN) 1 tab by mouth three times a day as needed pain  #60 x 3   Entered and Authorized by:   Kerby Nora MD   Signed by:   Kerby Nora MD on 12/30/2009   Method used:   Print then Give to Patient   RxID:   [id number]   Current Allergies (reviewed today): ! DICLOFENAC SODIUM (DICLOFENAC SODIUM) ! ASPIRIN ! * ANSAIDS ! ADVAIR HFA (FLUTICASONE-SALMETEROL) PENICILLIN G POTASSIUM (PENICILLIN G POTASSIUM)

## 2010-12-05 NOTE — Medication Information (Signed)
Summary: [facility]  [facility]   Imported By: Lanelle Bal 09/15/2010 15:14:34  _____________________________________________________________________  External Attachment:    Type:   Image     Comment:   External Document

## 2010-12-05 NOTE — Progress Notes (Signed)
Summary: Rx Cyclobenzaprine  Phone Note Refill Request Call back at [phone] Message from:  [facility]  on December 28, 2009 9:41 AM  Refills Requested: Medication #1:  CYCLOBENZAPRINE HCL 10 MG TABS Take 1 tablet by mouth at bedtime Received refill request from [facility], please advise   Method Requested: Telephone to Pharmacy Initial call taken by: Linde Gillis CMA Duncan Dull),  December 28, 2009 9:41 AM  Follow-up for Phone Call        Medication phoned to pharmacy.  Follow-up by: Delilah Shan CMA (AAMA),  December 28, 2009 5:30 PM    Prescriptions: CYCLOBENZAPRINE HCL 10 MG TABS (CYCLOBENZAPRINE HCL) Take 1 tablet by mouth at bedtime  #30 x 2   Entered and Authorized by:   Kerby Nora MD   Signed by:   Kerby Nora MD on 12/28/2009   Method used:   Telephoned to ...       [facility] [address] [phone]* (retail)       [address]       [place], [place]  [address]       Ph: [phone] or [phone]       Fax: [phone]   RxID:   [id number]

## 2010-12-05 NOTE — Progress Notes (Signed)
Summary: Rx Alprazolam  Phone Note Refill Request Call back at [phone] Message from:  [facility] on November 30, 2009 9:17 AM  Refills Requested: Medication #1:  ALPRAZOLAM 0.5 MG TABS 1 by mouth three times a day as needed anxiety   Last Refilled: 11/04/2009  Medication #2:  IBUPROFEN 800 MG TABS 1 tab by mouth three times a day as needed pain  Medication #3:  CYCLOBENZAPRINE HCL 10 MG TABS Take 1 tablet by mouth at bedtime Received faxed refill request, please advise   Method Requested: Telephone to Pharmacy Initial call taken by: Linde Gillis CMA Duncan Dull),  November 30, 2009 9:18 AM  Follow-up for Phone Call        Med[facility] to [facility] as instructed. Patient notified as instructed by telephone. Lewanda Rife LPN  December 01, 2009 9:07 AM     Prescriptions: ALPRAZOLAM 0.5 MG TABS (ALPRAZOLAM) 1 by mouth three times a day as needed anxiety  #90 x 0   Entered and Authorized by:   Kerby Nora MD   Signed by:   Kerby Nora MD on 11/30/2009   Method used:   Telephoned to ...       [facility]  [facility] [address] [phone]* (retail)       [address]       [place], [place]  [address]       Ph: [phone] or [phone]       Fax: [phone]   RxID:   [id number]

## 2010-12-05 NOTE — Progress Notes (Signed)
Summary: refill requests for flexeril, xanax, ibuprofen  Phone Note Refill Request Message from:  Patient  Refills Requested: Medication #1:  CYCLOBENZAPRINE HCL 10 MG TABS Take 1 tablet by mouth at bedtime  Medication #2:  ALPRAZOLAM 0.5 MG TABS 1 by mouth three times a day as needed anxiety  Medication #3:  IBUPROFEN 800 MG TABS 1 tab by mouth three times a day as needed pain Phoned requests from pt, [facility]  Initial call taken by: Lowella Petties CMA,  June 16, 2010 10:19 AM  Follow-up for Phone Call        Rx called to pharmacy Follow-up by: Benny Lennert CMA Duncan Dull),  June 16, 2010 2:06 PM    Prescriptions: IBUPROFEN 800 MG TABS (IBUPROFEN) 1 tab by mouth three times a day as needed pain  #60 x 3   Entered and Authorized by:   Kerby Nora MD   Signed by:   Kerby Nora MD on 06/16/2010   Method used:   Telephoned to ...       [facility]  [facility] [address] [phone]* (retail)       [address]       [place], [place]  [address]       Ph: [phone] or [phone]       Fax: [phone]   RxID:   [id number] ALPRAZOLAM 0.5 MG TABS (ALPRAZOLAM) 1 by mouth three times a day as needed anxiety  #90 x 0   Entered and Authorized by:   Kerby Nora MD   Signed by:   Kerby Nora MD on 06/16/2010   Method used:   Telephoned to ...       [facility]  [facility] [address] [phone]* (retail)       [address]       [place], [place]  [address]       Ph: [phone] or [phone]       Fax: [phone]   RxID:   [id number] CYCLOBENZAPRINE HCL 10 MG TABS (CYCLOBENZAPRINE HCL) Take 1 tablet by mouth at bedtime  #30 x 2   Entered and Authorized by:   Kerby Nora MD   Signed by:   Kerby Nora MD on 06/16/2010   Method used:   Telephoned to ...       [facility]  [facility] [address] [phone]* (retail)       [address]       [place], [place]  [address]       Ph: [phone] or [phone]       Fax:  [phone]   RxID:   [id number]

## 2010-12-05 NOTE — Progress Notes (Signed)
Summary: alprazolam   Phone Note Refill Request Message from:  Patient on August 10, 2010 8:34 AM  Refills Requested: Medication #1:  ALPRAZOLAM 0.5 MG TABS 1 by mouth three times a day as needed anxiety   Last Refilled: 07/14/2010 [facility]ill [facility] [address] [phone].   Initial call taken by: Melody Comas,  August 10, 2010 8:35 AM  Follow-up for Phone Call        Rx called to pharmacy Follow-up by: Benny Lennert CMA Duncan Dull),  August 10, 2010 2:10 PM    Prescriptions: ALPRAZOLAM 0.5 MG TABS (ALPRAZOLAM) 1 by mouth three times a day as needed anxiety  #90 x 0   Entered and Authorized by:   Kerby Nora MD   Signed by:   Kerby Nora MD on 08/10/2010   Method used:   Telephoned to ...       [facility]  [facility] [address] [phone]* (retail)       [address]       [place], [place]  [address]       Ph: [phone] or [phone]       Fax: [phone]   RxID:   [id number]

## 2010-12-05 NOTE — Assessment & Plan Note (Signed)
Summary: ?UTI/CLE   Vital Signs:  Patient profile:   50 year old female Height:      60 inches Weight:      220.0 pounds BMI:     43.12 Temp:     98.2 degrees F oral Pulse rate:   80 / minute Pulse rhythm:   regular BP sitting:   106 / 60  (left arm) Cuff size:   large  Vitals Entered By: Benny Lennert CMA Duncan Dull) (April 10, 2010 3:13 PM)  History of Present Illness: Chief complaint ? uti  getting some burning in her lower abdominal area some dysuria no back pain or flank pain  felt bad and was having some abdominal pain  has been walking, trying to lose weight  REVIEW OF SYSTEMS GEN: Acute illness details above. CV: No chest pain or SOB GI: as above Otherwise, pertinent positives and negatives are noted in the HPI.   GEN: WDWN, A&Ox4,NAD. Non-toxic HEENT: Atraumatic, normocephalic. CV: RRR, No M/G/R PULM: CTA B, No wheezes, crackles, or rhonchi ABD: S, NT, ND, +BS, no rebound. No CVAT. No suprapubic tenderness. EXT: No c/c/e   Allergies: 1)  ! Diclofenac Sodium (Diclofenac Sodium) 2)  ! Aspirin 3)  ! * Ansaids 4)  ! Advair Hfa (Fluticasone-Salmeterol) 5)  Penicillin G Potassium (Penicillin G Potassium)  Past History:  Past medical, surgical, family and social histories (including risk factors) reviewed, and no changes noted (except as noted below).  Past Medical History: Reviewed history from 03/12/2007 and no changes required. Allergic rhinitis Asthma Depression Diabetes mellitus, type II GERD Hyperlipidemia  Past Surgical History: Reviewed history from 06/24/2007 and no changes required. Appendectomy 1973 Cholecystectomy 08/2006  Family History: Reviewed history from 06/24/2007 and no changes required. Father: 39 Stroke in Apr 10, 1987 Mother: Died 57 tubal pregnancy Siblings: None BP:  (+) DM:  (+) Arthritis:  (+) CA:  Uncle, lung CA  Social History: Reviewed history from 06/24/2007 and no changes required. Current Smoker  ~ 1 PPD Alcohol  use-no Drug use-no Marital Status: Married Children:  Occupation: Housewife   Impression & Recommendations:  Problem # 1:  DYSURIA (ICD-788.1) Assessment New uti  Her updated medication list for this problem includes:    Ciprofloxacin Hcl 250 Mg Tabs (Ciprofloxacin hcl) .Marland Kitchen... 1 by mouth two times a day  Orders: T-Culture, Urine ([address]) UA Dipstick w/o Micro (manual) ([address])  Complete Medication List: 1)  Lopid 600 Mg Tabs (Gemfibrozil) .... Take 1 tablet by mouth twice a day 2)  Glipizide 10 Mg Tabs (Glipizide) .... Take 1 tablet by mouth daily 3)  Amitriptyline Hcl 50 Mg Tabs (Amitriptyline hcl) .... Take 1 tablet by mouth every night 4)  Cyclobenzaprine Hcl 10 Mg Tabs (Cyclobenzaprine hcl) .... Take 1 tablet by mouth at bedtime 5)  Tramadol Hcl 50 Mg Tabs (Tramadol hcl) .... Take 2  tablet by mouth every eight hours as needed 6)  Benazepril Hcl 10 Mg Tabs (Benazepril hcl) .... Take 1 tablet by mouth once a day 7)  Metformin Hcl 1000 Mg Tabs (Metformin hcl) .... Take 1 tablet by mouth twice a day 8)  Alprazolam 0.5 Mg Tabs (Alprazolam) .Marland Kitchen.. 1 by mouth three times a day as needed anxiety 9)  Proair Hfa 108 (90 Base) Mcg/act Aers (Albuterol sulfate) .... Inhale two puffs four times a day 10)  Actos 30 Mg Tabs (Pioglitazone hcl) .... Take 1 tablet by mouth once a day 11)  Eq Chlortabs 4 Mg Tabs (Chlorpheniramine maleate) .... As needed 12)  Ibuprofen 800 Mg Tabs (Ibuprofen) .Marland Kitchen.. 1 tab by mouth three times a day as needed pain 13)  Symbicort 80-4.5 Mcg/act Aero (Budesonide-formoterol fumarate) .... 2 puffs inhaled two times a day 14)  Ciprofloxacin Hcl 250 Mg Tabs (Ciprofloxacin hcl) .Marland Kitchen.. 1 by mouth two times a day 15)  Ondansetron 4 Mg Tbdp (Ondansetron) .Marland Kitchen.. 1 by mouth q 8 hours as needed nausea 16)  Fish Oil 1000 Mg Caps (Omega-3 fatty acids) .Marland Kitchen.. 1 tab by mouth two times a day Prescriptions: CIPROFLOXACIN HCL 250 MG TABS (CIPROFLOXACIN HCL) 1 by mouth two times a day  #14  x 0   Entered and Authorized by:   Hannah Beat MD   Signed by:   Hannah Beat MD on 04/10/2010   Method used:   Electronically to        [facility] [address] [phone]* (retail)       [address]       [place], [place]  [address]       Ph: [phone] or [phone]       Fax: [phone]   RxID:   [id number]   Current Allergies (reviewed today): ! DICLOFENAC SODIUM (DICLOFENAC SODIUM) ! ASPIRIN ! * ANSAIDS ! ADVAIR HFA (FLUTICASONE-SALMETEROL) PENICILLIN G POTASSIUM (PENICILLIN G POTASSIUM)  Laboratory Results   Urine Tests  Date/Time Received: April 10, 2010 3:33 PM  Date/Time Reported: April 10, 2010 3:33 PM   Routine Urinalysis   Color: yellow Appearance: Cloudy Glucose: negative   (Normal Range: Negative) Bilirubin: negative   (Normal Range: Negative) Ketone: negative   (Normal Range: Negative) Spec. Gravity: 1.020   (Normal Range: 1.003-1.035) Blood: large   (Normal Range: Negative) pH: 6.0   (Normal Range: 5.0-8.0) Protein: 100   (Normal Range: Negative) Urobilinogen: 0.2   (Normal Range: 0-1) Nitrite: positive   (Normal Range: Negative) Leukocyte Esterace: large   (Normal Range: Negative)       Appended Document: ?UTI/CLE

## 2010-12-05 NOTE — Progress Notes (Signed)
Summary: Rx Tramadol  Phone Note Refill Request Call back at [phone] Message from:  [facility] on February 14, 2010 8:02 AM  Refills Requested: Medication #1:  TRAMADOL HCL 50 MG TABS Take 2  tablet by mouth every eight hours as needed Received E-script request please advise.     Method Requested: Telephone to Pharmacy Initial call taken by: Linde Gillis CMA Duncan Dull),  February 14, 2010 8:05 AM  Follow-up for Phone Call        Rx called to pharmacy Follow-up by: Linde Gillis CMA Duncan Dull),  February 14, 2010 9:16 AM    Prescriptions: TRAMADOL HCL 50 MG TABS (TRAMADOL HCL) Take 2  tablet by mouth every eight hours as needed  #180 Tablet x 0   Entered and Authorized by:   Kerby Nora MD   Signed by:   Kerby Nora MD on 02/14/2010   Method used:   Telephoned to ...       [facility]  [facility] [address] [phone]* (retail)       [address]       [place], [place]  [address]       Ph: [phone] or [phone]       Fax: [phone]   RxID:   [id number]

## 2010-12-05 NOTE — Progress Notes (Signed)
Summary: ? UTI  Phone Note Call from Patient Call back at [phone]   Caller: Patient Call For: Kerby Nora MD Summary of Call: Patient states she thinks she has a UTI.  Having chills and burning upon urination, seeing a little blood when she urinates.  Been having some lower abdominal pain but no back pain.  Wanted to know if she needs to come in or bring in urine sample.  Please advise.   Initial call taken by: Linde Gillis CMA Duncan Dull),  January 11, 2010 11:01 AM  Follow-up for Phone Call        evaluation indicated Follow-up by: Hannah Beat MD,  January 11, 2010 11:07 AM  Additional Follow-up for Phone Call Additional follow up Details #1::        Appt scheduled for tomorrow, advised ok for her to take otc analgesic. Additional Follow-up by: Lowella Petties CMA,  January 11, 2010 12:50 PM

## 2010-12-05 NOTE — Progress Notes (Signed)
Summary: refill request for xanax  Phone Note Refill Request Message from:  Fax from Pharmacy  Refills Requested: Medication #1:  ALPRAZOLAM 0.5 MG TABS 1 by mouth three times a day as needed anxiety   Last Refilled: 12/28/2009 [facility], [phone].  Initial call taken by: Lowella Petties CMA,  January 25, 2010 10:47 AM  Follow-up for Phone Call        Rx called to pharmacy Follow-up by: Benny Lennert CMA Duncan Dull),  January 25, 2010 11:18 AM    Prescriptions: ALPRAZOLAM 0.5 MG TABS (ALPRAZOLAM) 1 by mouth three times a day as needed anxiety  #90 x 0   Entered and Authorized by:   Kerby Nora MD   Signed by:   Kerby Nora MD on 01/25/2010   Method used:   Telephoned to ...       [facility]  [facility] [address] [phone]* (retail)       [address]       [place], [place]  [address]       Ph: [phone] or [phone]       Fax: [phone]   RxID:   [id number]

## 2010-12-05 NOTE — Progress Notes (Signed)
Summary: tramodol  Phone Note Refill Request Message from:  [facility] on December 19, 2009 8:16 AM  Refills Requested: Medication #1:  TRAMADOL HCL 50 MG TABS Take 2  tablet by mouth every eight hours as needed   Supply Requested: 1 month [facility]   Method Requested: Electronic Initial call taken by: Benny Lennert CMA Duncan Dull),  December 19, 2009 8:17 AM  Follow-up for Phone Call        Rx called to pharmacy Follow-up by: Benny Lennert CMA Duncan Dull),  December 19, 2009 2:09 PM    Prescriptions: TRAMADOL HCL 50 MG TABS (TRAMADOL HCL) Take 2  tablet by mouth every eight hours as needed  #180 Tablet x 0   Entered and Authorized by:   Kerby Nora MD   Signed by:   Kerby Nora MD on 12/19/2009   Method used:   Telephoned to ...       [facility]  [facility] [address] [phone]* (retail)       [address]       [place], [place]  [address]       Ph: [phone] or [phone]       Fax: [phone]   RxID:   [id number]

## 2010-12-07 NOTE — Progress Notes (Signed)
Summary: refill request for alprazolam  Phone Note Refill Request Message from:  Fax from Pharmacy  Refills Requested: Medication #1:  ALPRAZOLAM 0.5 MG TABS 1 by mouth three times a day as needed anxiety   Last Refilled: 10/10/2010 [facility], [phone].  Initial call taken by: Lowella Petties CMA, AAMA,  November 07, 2010 10:58 AM  Follow-up for Phone Call        Rx called to pharmacy Follow-up by: Linde Gillis CMA Duncan Dull),  November 07, 2010 3:08 PM    Prescriptions: ALPRAZOLAM 0.5 MG TABS (ALPRAZOLAM) 1 by mouth three times a day as needed anxiety  #90 x 0   Entered and Authorized by:   Kerby Nora MD   Signed by:   Kerby Nora MD on 11/07/2010   Method used:   Telephoned to ...       [facility]  [facility] [address] [phone]* (retail)       [address]       [place], [place]  [address]       Ph: [phone] or [phone]       Fax: [phone]   RxID:   [id number]

## 2010-12-13 ENCOUNTER — Telehealth: Payer: Self-pay | Admitting: Family Medicine

## 2010-12-13 NOTE — Progress Notes (Signed)
Summary: cyclobenzaprine  Phone Note Refill Request Message from:  Fax from Pharmacy on December 04, 2010 10:04 AM  Refills Requested: Medication #1:  CYCLOBENZAPRINE HCL 10 MG TABS Take 1 tablet by mouth at bedtime   Last Refilled: 11/07/2010  Medication #2:  ALPRAZOLAM 0.5 MG TABS 1 by mouth three times a day as needed anxiety   Last Refilled: 11/07/2010 [facility]ill [facility] [address] [phone].  Initial call taken by: Melody Comas,  December 04, 2010 10:05 AM  Follow-up for Phone Call        Rx called to pharmacy Follow-up by: Benny Lennert CMA Duncan Dull),  December 04, 2010 10:33 AM    Prescriptions: ALPRAZOLAM 0.5 MG TABS (ALPRAZOLAM) 1 by mouth three times a day as needed anxiety  #90 x 0   Entered and Authorized by:   Kerby Nora MD   Signed by:   Kerby Nora MD on 12/04/2010   Method used:   Telephoned to ...       [facility]  [facility] [address] [phone]* (retail)       [address]       [place], [place]  [address]       Ph: [phone] or [phone]       Fax: [phone]   RxID:   [id number] CYCLOBENZAPRINE HCL 10 MG TABS (CYCLOBENZAPRINE HCL) Take 1 tablet by mouth at bedtime  #30 x 2   Entered and Authorized by:   Kerby Nora MD   Signed by:   Kerby Nora MD on 12/04/2010   Method used:   Telephoned to ...       [facility]  [facility] [address] [phone]* (retail)       [address]       [place], [place]  [address]       Ph: [phone] or [phone]       Fax: [phone]   RxID:   [id number]

## 2010-12-21 NOTE — Progress Notes (Signed)
Summary: tramadol   Phone Note Refill Request Message from:  Fax from Pharmacy on December 13, 2010 10:50 AM  Refills Requested: Medication #1:  TRAMADOL HCL 50 MG TABS Take 2  tablet by mouth every eight hours as needed   Last Refilled: 07/11/2010 Refill request from [facility] [address]   Initial call taken by: Melody Comas,  December 13, 2010 10:50 AM  Follow-up for Phone Call        Rx called to pharmacy Follow-up by: Benny Lennert CMA Duncan Dull),  December 14, 2010 7:26 AM    Prescriptions: TRAMADOL HCL 50 MG TABS (TRAMADOL HCL) Take 2  tablet by mouth every eight hours as needed  #180 Tablet x 0   Entered and Authorized by:   Kerby Nora MD   Signed by:   Kerby Nora MD on 12/13/2010   Method used:   Telephoned to ...       [facility]  [facility] [address] [phone]* (retail)       [address]       [place], [place]  [address]       Ph: [phone] or [phone]       Fax: [phone]   RxID:   [id number]

## 2010-12-26 ENCOUNTER — Encounter (INDEPENDENT_AMBULATORY_CARE_PROVIDER_SITE_OTHER): Payer: Self-pay | Admitting: *Deleted

## 2010-12-26 ENCOUNTER — Other Ambulatory Visit: Payer: Self-pay | Admitting: Family Medicine

## 2010-12-26 ENCOUNTER — Other Ambulatory Visit (INDEPENDENT_AMBULATORY_CARE_PROVIDER_SITE_OTHER): Payer: Medicare Other

## 2010-12-26 DIAGNOSIS — Z87448 Personal history of other diseases of urinary system: Secondary | ICD-10-CM | POA: Insufficient documentation

## 2010-12-26 DIAGNOSIS — E119 Type 2 diabetes mellitus without complications: Secondary | ICD-10-CM

## 2010-12-26 LAB — BASIC METABOLIC PANEL
Calcium: 10.2 mg/dL (ref 8.4–10.5)
GFR: 81.67 mL/min (ref >60.00)
Sodium: 142 mEq/L (ref 135–145)

## 2010-12-26 LAB — LIPID PANEL
HDL: 31.5 mg/dL — ABNORMAL LOW (ref >39.00)
Triglycerides: 171 mg/dL — ABNORMAL HIGH (ref 0.0–149.0)

## 2010-12-26 LAB — URINALYSIS, ROUTINE W REFLEX MICROSCOPIC
Specific Gravity, Urine: 1.02 (ref 1.000–1.030)
Total Protein, Urine: NEGATIVE
Urine Glucose: NEGATIVE

## 2010-12-26 LAB — MICROALBUMIN / CREATININE URINE RATIO
Creatinine,U: 63.2 mg/dL
Microalb Creat Ratio: 4.3 mg/g (ref 0.0–30.0)

## 2010-12-26 LAB — HEPATIC FUNCTION PANEL
Albumin: 3.3 g/dL — ABNORMAL LOW (ref 3.5–5.2)
Alkaline Phosphatase: 154 U/L — ABNORMAL HIGH (ref 39–117)
Bilirubin, Direct: 0.1 mg/dL (ref 0.0–0.3)
Total Bilirubin: 0.2 mg/dL — ABNORMAL LOW (ref 0.3–1.2)

## 2011-01-01 ENCOUNTER — Ambulatory Visit (INDEPENDENT_AMBULATORY_CARE_PROVIDER_SITE_OTHER): Payer: Medicare Other | Admitting: Family Medicine

## 2011-01-01 ENCOUNTER — Encounter: Payer: Self-pay | Admitting: Family Medicine

## 2011-01-01 DIAGNOSIS — E785 Hyperlipidemia, unspecified: Secondary | ICD-10-CM

## 2011-01-01 DIAGNOSIS — IMO0001 Reserved for inherently not codable concepts without codable children: Secondary | ICD-10-CM

## 2011-01-01 DIAGNOSIS — E119 Type 2 diabetes mellitus without complications: Secondary | ICD-10-CM

## 2011-01-02 ENCOUNTER — Ambulatory Visit: Payer: Self-pay | Admitting: Family Medicine

## 2011-01-11 NOTE — Assessment & Plan Note (Signed)
Summary: 3 mo f/u DLO   Vital Signs:  Patient profile:   50 year old female Height:      60 inches Weight:      227 pounds BMI:     44.49 Temp:     97.5 degrees F oral Pulse rate:   76 / minute Pulse rhythm:   regular BP sitting:   120 / 78  (left arm) Cuff size:   large  Vitals Entered By: Benny Lennert CMA Duncan Dull) (January 01, 2011 3:39 PM)  History of Present Illness: Chief complaint 3 month follow up   DM, improved A1C since last check. Trending down.   No weight loss since last OV.  BP at goal <130/80.  Working on improved diet.  Tryng to restart  exercsie...has been walking some but not able to start bike.  No personal hx or family hx of bladder cancer. On Actos, glipizide and metformin.   She has noted that body pain is worse since she has gained weight back. Playing on floor with grandaughter a lot. Using second cyclobenzaprine at 4 AM. Using iburpofen... trying to limit tramadol.  Microalbumin.Marland Kitchen on ACEI  High cholesterol.. LDL at goal , on lopid  and fish oil for triglycerides, trending back up. HDl less than 40.   Diabetes Management History:      The patient is a 50 years old female who comes in for evaluation of DM Type 2.  She is checking home blood sugars.  She says that she is exercising.  Type of exercise includes: walking.  Duration of exercise is estimated to be 20 min.  She is doing this 6 times per week.    Problems Prior to Update: 1)  Uti's, Hx of  (ICD-V13.00) 2)  Acute Frontal Sinusitis  (ICD-461.1) 3)  Dysuria  (ICD-788.1) 4)  Asthma, Extrinsic, With Acute Exacerbation  (ICD-493.02) 5)  Routine Gynecological Examination  (ICD-V72.31) 6)  Well Woman  (ICD-V70.0) 7)  Other Screening Mammogram  (ICD-V76.12) 8)  Back Pain, Chronic  (ICD-724.5) 9)  Hematochezia  (ICD-578.1) 10)  Osteoarthritis  (ICD-715.90) 11)  Renal Calculus  (ICD-592.0) 12)  Anxiety  (ICD-300.00) 13)  Tobacco Abuse  (ICD-305.1) 14)  Sleep Apnea  (ICD-780.57) 15)   Hyperlipidemia  (ICD-272.4) 16)  Gerd  (ICD-530.81) 17)  Diabetes Mellitus, Type II  (ICD-250.00) 18)  Depression  (ICD-311) 19)  Allergic Rhinitis  (ICD-477.9) 20)  Fibromyalgia  (ICD-729.1)  Current Medications (verified): 1)  Lopid 600 Mg Tabs (Gemfibrozil) .... Take 1 Tablet By Mouth Twice A Day 2)  Glipizide 10 Mg Tabs (Glipizide) .... Take 1 Tablet By Mouth Daily 3)  Amitriptyline Hcl 50 Mg Tabs (Amitriptyline Hcl) .... Take 1 Tablet By Mouth Every Night 4)  Cyclobenzaprine Hcl 10 Mg Tabs (Cyclobenzaprine Hcl) .... Take 1 Tablet By Mouth At Bedtime 5)  Tramadol Hcl 50 Mg Tabs (Tramadol Hcl) .... Take 2  Tablet By Mouth Every Eight Hours As Needed 6)  Benazepril Hcl 10 Mg Tabs (Benazepril Hcl) .... Take 1 Tablet By Mouth Once A Day 7)  Metformin Hcl 1000 Mg Tabs (Metformin Hcl) .... Take 1 Tablet By Mouth Twice A Day 8)  Alprazolam 0.5 Mg Tabs (Alprazolam) .Marland Kitchen.. 1 By Mouth Three Times A Day As Needed Anxiety 9)  Proair Hfa 108 (90 Base) Mcg/act Aers (Albuterol Sulfate) .... Inhale Two Puffs Four Times A Day 10)  Actos 30 Mg  Tabs (Pioglitazone Hcl) .... Take 1 Tablet By Mouth Once A Day 11)  Eq Chlortabs 4  Mg  Tabs (Chlorpheniramine Maleate) .... As Needed 12)  Ibuprofen 800 Mg Tabs (Ibuprofen) .Marland Kitchen.. 1 Tab By Mouth Three Times A Day As Needed Pain 13)  Symbicort 80-4.5 Mcg/act Aero (Budesonide-Formoterol Fumarate) .... 2 Puffs Inhaled Two Times A Day 14)  Fish Oil 1000 Mg Caps (Omega-3 Fatty Acids) .Marland Kitchen.. 1 Tab By Mouth Two Times A Day  Allergies: 1)  ! Diclofenac Sodium (Diclofenac Sodium) 2)  ! Aspirin 3)  ! * Ansaids 4)  ! Advair Hfa (Fluticasone-Salmeterol) 5)  Penicillin G Potassium (Penicillin G Potassium)  Past History:  Past medical, surgical, family and social histories (including risk factors) reviewed, and no changes noted (except as noted below).  Past Medical History: Reviewed history from 03/12/2007 and no changes required. Allergic  rhinitis Asthma Depression Diabetes mellitus, type II GERD Hyperlipidemia  Past Surgical History: Reviewed history from 06/24/2007 and no changes required. Appendectomy 1973 Cholecystectomy 08/2006  Family History: Reviewed history from 06/24/2007 and no changes required. Father: 67 Stroke in 1987/04/03 Mother: Died 60 tubal pregnancy Siblings: None BP:  (+) DM:  (+) Arthritis:  (+) CA:  Uncle, lung CA  Social History: Reviewed history from 06/24/2007 and no changes required. Current Smoker  ~ 1 PPD Alcohol use-no Drug use-no Marital Status: Married Children:  Occupation: Housewife  Review of Systems General:  Complains of fatigue. CV:  Denies chest pain or discomfort. Resp:  Denies shortness of breath. GI:  Denies abdominal pain. GU:  Denies dysuria.  Physical Exam  General:  obese appearing female inNAD  Mouth:  MMM Neck:  no carotid bruit or thyromegaly no cervical or supraclavicular lymphadenopathy  Lungs:  Normal respiratory effort, chest expands symmetrically. Lungs are clear to auscultation, no crackles or wheezes. Heart:  Normal rate and regular rhythm. S1 and S2 normal without gallop, murmur, click, rub or other extra sounds. Abdomen:  Bowel sounds positive,abdomen soft and non-tender without masses, organomegaly or hernias noted. Pulses:  R and L posterior tibial pulses are full and equal bilaterally  Extremities:  no edema  Psych:  Oriented X3 and flat affect.    Diabetes Management Exam:    Foot Exam (with socks and/or shoes not present):       Sensory-Pinprick/Light touch:          Left medial foot (L-4): normal          Left dorsal foot (L-5): normal          Left lateral foot (S-1): normal          Right medial foot (L-4): normal          Right dorsal foot (L-5): normal          Right lateral foot (S-1): normal       Sensory-Monofilament:          Left foot: normal          Right foot: normal       Inspection:          Left foot: normal           Right foot: normal       Nails:          Left foot: normal          Right foot: normal    Eye Exam:       Eye Exam done elsewhere          Date: 01/04/2011          Results: pending  Done by: eye MD   Impression & Recommendations:  Problem # 1:  DIABETES MELLITUS, TYPE II (ICD-250.00) IMproved control.Continue glipizide, actos and metformin.  Encouraged exercise, weight loss, healthy eating habits.  Her updated medication list for this problem includes:    Glipizide 10 Mg Tabs (Glipizide) .Marland Kitchen... Take 1 tablet by mouth daily    Benazepril Hcl 10 Mg Tabs (Benazepril hcl) .Marland Kitchen... Take 1 tablet by mouth once a day    Metformin Hcl 1000 Mg Tabs (Metformin hcl) .Marland Kitchen... Take 1 tablet by mouth twice a day    Actos 30 Mg Tabs (Pioglitazone hcl) .Marland Kitchen... Take 1 tablet by mouth once a day  Problem # 2:  HYPERLIPIDEMIA (ICD-272.4) LDl at goal, triglycerides elevated. Encouraged exercise, weight loss, healthy eating habits.  Her updated medication list for this problem includes:    Lopid 600 Mg Tabs (Gemfibrozil) .Marland Kitchen... Take 1 tablet by mouth twice a day  Problem # 3:  FIBROMYALGIA (ICD-729.1) Leg paina nd body pain.. should improve with weight loss and exercise.   Okay to use extrsa dose of flexericl temporarily as needed.  Limit tramadol.  Her updated medication list for this problem includes:    Cyclobenzaprine Hcl 10 Mg Tabs (Cyclobenzaprine hcl) .Marland Kitchen... Take 1 tablet by mouth at bedtime    Tramadol Hcl 50 Mg Tabs (Tramadol hcl) .Marland Kitchen... Take 2  tablet by mouth every eight hours as needed    Ibuprofen 800 Mg Tabs (Ibuprofen) .Marland Kitchen... 1 tab by mouth three times a day as needed pain  Complete Medication List: 1)  Lopid 600 Mg Tabs (Gemfibrozil) .... Take 1 tablet by mouth twice a day 2)  Glipizide 10 Mg Tabs (Glipizide) .... Take 1 tablet by mouth daily 3)  Amitriptyline Hcl 50 Mg Tabs (Amitriptyline hcl) .... Take 1 tablet by mouth every night 4)  Cyclobenzaprine Hcl 10 Mg Tabs (Cyclobenzaprine  hcl) .... Take 1 tablet by mouth at bedtime 5)  Tramadol Hcl 50 Mg Tabs (Tramadol hcl) .... Take 2  tablet by mouth every eight hours as needed 6)  Benazepril Hcl 10 Mg Tabs (Benazepril hcl) .... Take 1 tablet by mouth once a day 7)  Metformin Hcl 1000 Mg Tabs (Metformin hcl) .... Take 1 tablet by mouth twice a day 8)  Alprazolam 0.5 Mg Tabs (Alprazolam) .Marland Kitchen.. 1 by mouth three times a day as needed anxiety 9)  Proair Hfa 108 (90 Base) Mcg/act Aers (Albuterol sulfate) .... Inhale two puffs four times a day 10)  Actos 30 Mg Tabs (Pioglitazone hcl) .... Take 1 tablet by mouth once a day 11)  Eq Chlortabs 4 Mg Tabs (Chlorpheniramine maleate) .... As needed 12)  Ibuprofen 800 Mg Tabs (Ibuprofen) .Marland Kitchen.. 1 tab by mouth three times a day as needed pain 13)  Symbicort 80-4.5 Mcg/act Aero (Budesonide-formoterol fumarate) .... 2 puffs inhaled two times a day 14)  Fish Oil 1000 Mg Caps (Omega-3 fatty acids) .Marland Kitchen.. 1 tab by mouth two times a day  Diabetes Management Assessment/Plan:      The following lipid goals have been established for the patient: Total cholesterol goal of 200; LDL cholesterol goal of 100; HDL cholesterol goal of 40; Triglyceride goal of 150.  Her blood pressure goal is < 130/80.    Patient Instructions: 1)  Please schedule a follow-up appointment in 3 months for CPX. 2)  HgBA1c prior to visit  ICD-9: 250.00 3)  Continue to work on exercise and weight loss. Prescriptions: IBUPROFEN 800 MG TABS (IBUPROFEN) 1 tab by mouth three  times a day as needed pain  #60 x 3   Entered and Authorized by:   Kerby Nora MD   Signed by:   Kerby Nora MD on 01/01/2011   Method used:   Print then Give to Patient   RxID:   [id number] ALPRAZOLAM 0.5 MG TABS (ALPRAZOLAM) 1 by mouth three times a day as needed anxiety  #90 x 0   Entered and Authorized by:   Kerby Nora MD   Signed by:   Kerby Nora MD on 01/01/2011   Method used:   Print then Give to Patient   RxID:   [id number]    Orders  Added: 1)  Est. Patient Level IV [84696]    Current Allergies (reviewed today): ! DICLOFENAC SODIUM (DICLOFENAC SODIUM) ! ASPIRIN ! * ANSAIDS ! ADVAIR HFA (FLUTICASONE-SALMETEROL) PENICILLIN G POTASSIUM (PENICILLIN G POTASSIUM)

## 2011-01-29 ENCOUNTER — Encounter: Payer: Self-pay | Admitting: Family Medicine

## 2011-01-29 ENCOUNTER — Other Ambulatory Visit: Payer: Self-pay | Admitting: *Deleted

## 2011-01-29 MED ORDER — ALPRAZOLAM 0.5 MG PO TABS: 0.5000 mg | ORAL_TABLET | Freq: Three times a day (TID) | ORAL | Status: DC | PRN

## 2011-01-29 NOTE — Telephone Encounter (Signed)
Ok to refill #30, 0 refills Dr. Ermalene Searing out of town

## 2011-01-29 NOTE — Telephone Encounter (Signed)
rx called to pharmacy 

## 2011-01-30 ENCOUNTER — Other Ambulatory Visit: Payer: Self-pay | Admitting: *Deleted

## 2011-01-30 NOTE — Telephone Encounter (Signed)
Already been refilled.

## 2011-01-31 ENCOUNTER — Telehealth: Payer: Self-pay | Admitting: *Deleted

## 2011-01-31 NOTE — Telephone Encounter (Signed)
I don't know which prescription this is referring to.. ? Alprazolam?

## 2011-01-31 NOTE — Telephone Encounter (Signed)
This request is for alprazolam, I dont know why the medicine is not showing up in the  Note.

## 2011-02-01 ENCOUNTER — Ambulatory Visit (INDEPENDENT_AMBULATORY_CARE_PROVIDER_SITE_OTHER): Payer: Medicare Other | Admitting: Family Medicine

## 2011-02-01 ENCOUNTER — Encounter: Payer: Self-pay | Admitting: Family Medicine

## 2011-02-01 DIAGNOSIS — R079 Chest pain, unspecified: Secondary | ICD-10-CM | POA: Insufficient documentation

## 2011-02-01 DIAGNOSIS — E785 Hyperlipidemia, unspecified: Secondary | ICD-10-CM

## 2011-02-01 DIAGNOSIS — R209 Unspecified disturbances of skin sensation: Secondary | ICD-10-CM

## 2011-02-01 DIAGNOSIS — R200 Anesthesia of skin: Secondary | ICD-10-CM

## 2011-02-01 DIAGNOSIS — E119 Type 2 diabetes mellitus without complications: Secondary | ICD-10-CM

## 2011-02-01 DIAGNOSIS — F17200 Nicotine dependence, unspecified, uncomplicated: Secondary | ICD-10-CM

## 2011-02-01 MED ORDER — NITROGLYCERIN 0.4 MG SL SUBL: 0.4000 mg | SUBLINGUAL_TABLET | SUBLINGUAL | Status: DC | PRN

## 2011-02-01 MED ORDER — ALPRAZOLAM 0.5 MG PO TABS: 0.5000 mg | ORAL_TABLET | Freq: Three times a day (TID) | ORAL | Status: DC | PRN

## 2011-02-01 MED ORDER — ASPIRIN EC 81 MG PO TBEC: 81.0000 mg | DELAYED_RELEASE_TABLET | Freq: Every day | ORAL | Status: DC

## 2011-02-01 NOTE — Assessment & Plan Note (Addendum)
Discussed importance of cessation.   Recommended start with sandwich bag each day of week and fill with allotment for the day.  Over weeks can decrease # cig/day, go at her own pace.  Seems motivated to try this free cessation method.

## 2011-02-01 NOTE — Assessment & Plan Note (Addendum)
Doubt CVA as no other sxs for this.  Does have risk factors though - continue to manage aggressively.  rec start EC baby ASA daily. Doubt coming from spine as exam negative today. ? If from fibro, and sleeping on L side as only happens when awakening. Recommend monitor for now.  If happens again, not improving to seek urgent medical care.   DM, HTN, HLD well controlled currently.  HDL a bit low though.  Hold off on niacin for now (h/o DM), rec getting back on bike for exercise.  Already on fibrate and fish oil. Would be good candidate for CETPs once they come out.

## 2011-02-01 NOTE — Telephone Encounter (Signed)
Rx called to pharmacy

## 2011-02-01 NOTE — Patient Instructions (Addendum)
This doesn't sound like a stroke.  However you do have risk factors for that. Start enteric coated baby aspirin daily, take with food.  This should help with both stroke and heart attack risk.   Continue medicines as up to now. EKG today to check on heart - looked like some old damage. Start exercising again - this will help raise your good cholesterol.  If chest pain returns, stop exercising and let us know. Take sublingual nitro if chest pain returns.  Ok to repeat every 5 min x 3 if not relieving chest pain (may cause headache though).  If used, call us or go to ER. You need to quit smoking - work on [employer] - it's free! Neurologic exam is normal today.  If this happens again, please return to be seen. Return to see Dr. Ermalene Searing in 1 month, sooner if happening more frequently. Pass by [facility]'s office for Referral to Cardiology.  This will be to see how the heart is doing.
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
## 2011-02-01 NOTE — Telephone Encounter (Signed)
First refill sent was for 30 tabs... Should have been 90... Will send in # 60, 0 RF.

## 2011-02-01 NOTE — Assessment & Plan Note (Addendum)
Check EKG - NSR 92, LAD, nl intervals and no hypertrophy.  Poor r wave progression, III with q.  Abnormal. Good story, + risk factors. Start baby aspirin.  Start SL nitro. Refer to cards for further risk stratification. F/u with PCP 1 mo.

## 2011-02-04 ENCOUNTER — Other Ambulatory Visit: Payer: Self-pay | Admitting: Family Medicine

## 2011-02-04 HISTORY — PX: TRANSTHORACIC ECHOCARDIOGRAM: SHX275

## 2011-02-21 ENCOUNTER — Encounter: Payer: Self-pay | Admitting: Cardiovascular Disease

## 2011-02-21 ENCOUNTER — Ambulatory Visit (INDEPENDENT_AMBULATORY_CARE_PROVIDER_SITE_OTHER): Payer: Medicare Other | Admitting: Cardiovascular Disease

## 2011-02-21 DIAGNOSIS — I10 Essential (primary) hypertension: Secondary | ICD-10-CM

## 2011-02-21 DIAGNOSIS — R079 Chest pain, unspecified: Secondary | ICD-10-CM

## 2011-02-21 DIAGNOSIS — E785 Hyperlipidemia, unspecified: Secondary | ICD-10-CM

## 2011-02-21 DIAGNOSIS — R011 Cardiac murmur, unspecified: Secondary | ICD-10-CM

## 2011-02-21 DIAGNOSIS — F17200 Nicotine dependence, unspecified, uncomplicated: Secondary | ICD-10-CM

## 2011-02-21 DIAGNOSIS — E119 Type 2 diabetes mellitus without complications: Secondary | ICD-10-CM

## 2011-02-21 NOTE — Patient Instructions (Signed)
Your physician has requested that you have a lexiscan myoview. For further information please visit [email or web address] Please follow instruction sheet, as given.  Your physician has requested that you have an echocardiogram. Echocardiography is a painless test that uses sound waves to create images of your heart. It provides your doctor with information about the size and shape of your heart and how well your heart's chambers and valves are working. This procedure takes approximately one hour. There are no restrictions for this procedure.  Your physician recommends that you schedule a follow-up appointment in: as needed with Dr. Eden Emms.  We will call you with your test results.

## 2011-02-21 NOTE — Assessment & Plan Note (Signed)
Cholesterol is at goal.  Continue current dose of statin and diet Rx.  No myalgias or side effects.  F/U  LFT's in 6 months. Lab Results  Component Value Date   LDLCALC 33 12/26/2010

## 2011-02-21 NOTE — Assessment & Plan Note (Signed)
SEM of AV sclerosis.  Check echo

## 2011-02-21 NOTE — Assessment & Plan Note (Signed)
Prefer for patient not to be on Actos due to volume retention.  F/U primary Target A1c less than 7

## 2011-02-21 NOTE — Assessment & Plan Note (Signed)
Counseled for less than 10 minutes.  F/U plan per primary

## 2011-02-21 NOTE — Assessment & Plan Note (Signed)
Atypical features with multiple CRF;s and abnormal ECG.  Lexiscan myovue  Two day protocol given stature

## 2011-02-21 NOTE — Progress Notes (Signed)
50 yo referred by primary Dr. Cato Mulligan for multipla CRF;s, and chest/arm pain.  Patient is obese, sedentary, smoker with DM, HTN and elevated lipids.  Has had two periods of left arm and chest pain One end of last year and one recently. Awoke with left arm pain like it was not attached to body.  Has dyspnea with exertion.  No palpitations or syncope.  No previous ETT.  ECG reviewed from 02/01/11 ? Old anteroseptal MI.  Counseled for less than 10 minutes regarding smoking cessation. Has "sandwich: bag plan described by primary that she has not started yet.  Compliant with meds.  Unable to walk far due to arthritis and general poor functional status.    Given all of her CRF;s and abnormal ECG with poor functional status with symptoms should have two day Lexiscan Myovue  ROS: Denies fever, malais, weight loss, blurry vision, decreased visual acuity, cough, sputum, SOB, hemoptysis, pleuritic pain, palpitaitons, heartburn, abdominal pain, melena, lower extremity edema, claudication, or rash.   General: Affect appropriate Healthy:  appears stated age HEENT: normal Neck supple with no adenopathy JVP normal no bruits no thyromegaly Lungs clear with no wheezing and good diaphragmatic motion Heart:  S1/S2  SEM no rub, gallop or click PMI normal Abdomen: benighn, BS positve, no tenderness, no AAA no bruit.  No HSM or HJR Distal pulses intact with no bruits No edema Neuro non-focal Skin warm and dry No muscular weakness  Medications Current Outpatient Prescriptions  Medication Sig Dispense Refill  . ALPRAZolam (XANAX) 0.5 MG tablet Take 1 tablet (0.5 mg total) by mouth 3 (three) times daily as needed for sleep or anxiety.  60 tablet  0  . amitriptyline (ELAVIL) 50 MG tablet Take 50 mg by mouth at bedtime.        Marland Kitchen aspirin EC 81 MG EC tablet Take 1 tablet (81 mg total) by mouth daily.      . benazepril (LOTENSIN) 10 MG tablet Take 10 mg by mouth daily.        . budesonide-formoterol (SYMBICORT)  80-4.5 MCG/ACT inhaler Inhale 2 puffs into the lungs 2 (two) times daily.        . calcium elemental as carbonate (ANTACID ULTRA STRENGTH) 400 MG tablet Chew 1,000 mg by mouth as needed.        . cyclobenzaprine (FLEXERIL) 10 MG tablet Take 10 mg by mouth at bedtime.        . fish oil-omega-3 fatty acids 1000 MG capsule Take 1 g by mouth daily.        Marland Kitchen gemfibrozil (LOPID) 600 MG tablet Take 600 mg by mouth 2 (two) times daily before a meal.        . glipiZIDE (GLUCOTROL) 10 MG tablet Take 10 mg by mouth daily.        Marland Kitchen ibuprofen (ADVIL,MOTRIN) 800 MG tablet Take 800 mg by mouth every 8 (eight) hours as needed.        . metFORMIN (GLUCOPHAGE) 1000 MG tablet Take 1,000 mg by mouth 2 (two) times daily with a meal.        . nitroGLYCERIN (NITROSTAT) 0.4 MG SL tablet Place 1 tablet (0.4 mg total) under the tongue every 5 (five) minutes as needed for chest pain. May use up to 3 x, if used call us or go to ER.  20 tablet  1  . pioglitazone (ACTOS) 30 MG tablet Take 30 mg by mouth daily.        Marland Kitchen PROAIR HFA 108 (90  BASE) MCG/ACT inhaler INHALE 2 PUFFS 4 TIMES A DAY  1 Inhaler  5  . traMADol (ULTRAM) 50 MG tablet Take 100 mg by mouth every 8 (eight) hours as needed.        Marland Kitchen DISCONTD: chlorpheniramine (CHLOR-TRIMETON) 4 MG tablet Take 4 mg by mouth as needed.          Allergies Advair hfa; Aspirin; Diclofenac sodium; Nsaids; and Penicillins  Family History: Family History  Problem Relation Age of Onset  . Stroke Father     Social History: History   Social History  . Marital Status: Married    Spouse Name: N/A    Number of Children: N/A  . Years of Education: N/A   Occupational History  . Housewife    Social History Main Topics  . Smoking status: Current Everyday Smoker -- 1.0 packs/day for 35 years  . Smokeless tobacco: Not on file  . Alcohol Use: No  . Drug Use: No  . Sexually Active: Not on file   Other Topics Concern  . Not on file   Social History Narrative  . No narrative  on file    Electrocardiogram:  NSR 36 old anteroseptal infarct vs lead placement  Assessment and Plan

## 2011-02-21 NOTE — Assessment & Plan Note (Signed)
Well controlled.  Continue current medications and low sodium Dash type diet.    

## 2011-02-23 ENCOUNTER — Other Ambulatory Visit: Payer: Self-pay | Admitting: *Deleted

## 2011-02-23 MED ORDER — TRAMADOL HCL 50 MG PO TABS: 100.0000 mg | ORAL_TABLET | Freq: Three times a day (TID) | ORAL | Status: DC | PRN

## 2011-02-27 ENCOUNTER — Other Ambulatory Visit: Payer: Self-pay | Admitting: *Deleted

## 2011-02-27 MED ORDER — CYCLOBENZAPRINE HCL 10 MG PO TABS: 10.0000 mg | ORAL_TABLET | Freq: Every day | ORAL | Status: DC

## 2011-02-27 MED ORDER — ALPRAZOLAM 0.5 MG PO TABS: 0.5000 mg | ORAL_TABLET | Freq: Three times a day (TID) | ORAL | Status: DC | PRN

## 2011-02-27 NOTE — Telephone Encounter (Signed)
RX CALLED TO PHARMACY  

## 2011-02-27 NOTE — Telephone Encounter (Signed)
Patient called and requested #90 and states that is what she normally gets each time.

## 2011-03-01 ENCOUNTER — Ambulatory Visit (HOSPITAL_COMMUNITY): Payer: Medicare Other | Attending: Cardiovascular Disease

## 2011-03-01 ENCOUNTER — Ambulatory Visit (HOSPITAL_COMMUNITY): Payer: Medicare Other | Attending: Cardiovascular Disease | Admitting: Radiology

## 2011-03-01 DIAGNOSIS — R079 Chest pain, unspecified: Secondary | ICD-10-CM | POA: Insufficient documentation

## 2011-03-01 DIAGNOSIS — E109 Type 1 diabetes mellitus without complications: Secondary | ICD-10-CM

## 2011-03-01 DIAGNOSIS — R0602 Shortness of breath: Secondary | ICD-10-CM

## 2011-03-01 DIAGNOSIS — E119 Type 2 diabetes mellitus without complications: Secondary | ICD-10-CM

## 2011-03-01 DIAGNOSIS — R011 Cardiac murmur, unspecified: Secondary | ICD-10-CM

## 2011-03-01 DIAGNOSIS — R0989 Other specified symptoms and signs involving the circulatory and respiratory systems: Secondary | ICD-10-CM

## 2011-03-01 DIAGNOSIS — R072 Precordial pain: Secondary | ICD-10-CM

## 2011-03-01 MED ORDER — REGADENOSON 0.4 MG/5ML IV SOLN
0.4000 mg | Freq: Once | INTRAVENOUS | Status: AC
  Administered 2011-03-01: 0.4 mg via INTRAVENOUS

## 2011-03-01 MED ORDER — TECHNETIUM TC 99M TETROFOSMIN IV KIT
33.0000 | PACK | Freq: Once | INTRAVENOUS | Status: AC | PRN
  Administered 2011-03-01: 33 via INTRAVENOUS

## 2011-03-01 MED ORDER — TECHNETIUM TC 99M TETROFOSMIN IV KIT
11.0000 | PACK | Freq: Once | INTRAVENOUS | Status: AC | PRN
  Administered 2011-03-01: 11 via INTRAVENOUS

## 2011-03-01 NOTE — Progress Notes (Signed)
[facility] SITE 3 NUCLEAR MED [address] [place] [address] [phone]  Cardiology Nuclear Med Study  Sandra Ferrell is a 50 y.o. female [id number] 10/15/1961   Nuclear Med Background Indication for Stress Test:  Evaluation for Ischemia and Abnormal EKG History:  Asthma Cardiac Risk Factors: Hypertension, Lipids, NIDDM and Smoker  Symptoms:  Chest Pain and DOE   Nuclear Pre-Procedure Caffeine/Decaff Intake:  None NPO After: 4:30am   Lungs:  clear IV 0.9% NS with Angio Cath:  20g  IV Site: R Antecubital  IV Started by:  Stanton Kidney, EMT-P  Chest Size (in):  40 Cup Size: C  Height: 5' (1.524 m)  Weight:  224 lb (101.606 kg)  BMI:  Body mass index is 43.75 kg/(m^2). Tech Comments:  NA    Nuclear Med Study 1 or 2 day study: 1 day  Stress Test Type:  Eugenie Birks  Reading MD: Kristeen Miss, MD  Order Authorizing Provider:  P.Nishan  Resting Radionuclide: Technetium 9m Tetrofosmin  Resting Radionuclide Dose: 11 mCi   Stress Radionuclide:  Technetium 40m Tetrofosmin  Stress Radionuclide Dose: 33 mCi           Stress Protocol Rest HR: 93 Stress HR: 107  Rest BP: 130/68 Stress BP: 132/58  Exercise Time (min): n/a METS: n/a   Predicted Max HR: 170 bpm % Max HR: 62.94 bpm Rate Pressure Product: 21308   Dose of Adenosine (mg):  n/a Dose of Lexiscan: 0.4 mg  Dose of Atropine (mg): n/a Dose of Dobutamine: n/a mcg/kg/min (at max HR)  Stress Test Technologist: Milana Na, EMT-P  Nuclear Technologist:  Domenic Polite, CNMT     Rest Procedure:  Myocardial perfusion imaging was performed at rest 45 minutes following the intravenous administration of Technetium 52m Tetrofosmin. Rest ECG: NSR  Stress Procedure:  The patient received IV Lexiscan 0.4 mg over 15-seconds.  Technetium 77m Tetrofosmin injected at 30-seconds.  There were no significant changes with Lexiscan.  Quantitative spect images were obtained after a 45 minute delay. Stress ECG: No  significant change from baseline ECG  QPS Raw Data Images:  Normal; no motion artifact; normal heart/lung ratio. Stress Images:  Normal homogeneous uptake in all areas of the myocardium. Rest Images:  Normal homogeneous uptake in all areas of the myocardium. Subtraction (SDS):  No evidence of ischemia. Transient Ischemic Dilatation (Normal <1.22):  .89  Lung/Heart Ratio (Normal <0.45): .34   Quantitative Gated Spect Images QGS EDV:  58 ml QGS ESV:  11 ml QGS cine images:  NL LV Function; NL Wall Motion QGS EF: 80%  Impression Exercise Capacity:  Lexiscan with no exercise. BP Response:  Normal blood pressure response. Clinical Symptoms:  No chest pain. ECG Impression:  No significant ST segment change suggestive of ischemia. Comparison with Prior Nuclear Study: No images to compare  Overall Impression:  Normal stress nuclear study.  No evidence of ischemia. Normal LV function.   Elyn Aquas., MD

## 2011-03-02 ENCOUNTER — Encounter: Payer: Self-pay | Admitting: Family Medicine

## 2011-03-02 NOTE — Progress Notes (Signed)
ROUTED TO DR. NISHAN.Falecha Clark °

## 2011-03-05 NOTE — Progress Notes (Signed)
pt aware of results Sandra Ferrell  

## 2011-03-20 NOTE — Assessment & Plan Note (Signed)
[facility]                                 ON-CALL NOTE   NAME:[name], Sandra Ferrell                       MRN:          [id number]  DATE:06/27/2007                            DOB:          [birth year]    PHONE NUMBER:  [phone].   Patient of Dr. Daphine Deutscher.  Phone call came at 5:07 p.m. on August 22.  Ms. Roig called because her medication had not been called in.  I  checked with Dr. Daphine Deutscher nurse, and it had been called in, and she  called to inform the patient.     Karie Schwalbe, MD  Electronically Signed    RIL/MedQ  DD: 06/27/2007  DT: 06/29/2007  Job #: [id number]   cc:   Kerby Nora, MD

## 2011-03-23 NOTE — Assessment & Plan Note (Signed)
[facility]                             PRIMARY CARE OFFICE NOTE   KORENE, DULA                     MRN:          [id number]  DATE:08/06/2006                            DOB:          Sep 05, 1961    CHIEF COMPLAINT:  New patient to practice.   HISTORY OF PRESENT ILLNESS:  This patient is a 50 year old white female here  to establish primary care. The patient is formerly followed at clinic at  [facility]. She has somewhat complex medical history including type 2  diabetes since year 2000. She also states that she has been treated for  presumed polymyalgia rheumatica. Was on prednisone for several years. She  also has multiple areas of pain in her back. Also has pain in her knees  presumed secondary to osteoarthritis. Most recently the patient complained  of urinary symptoms. Was seen several times at clinic. Urinalysis was  unremarkable, and the patient ultimately was referred to Dr. Annabell Howells and  underwent CAT scan of her abdomen which revealed gallstones. The patient did  have some postprandial nausea and vomiting. There was concern whether this  may have been pyelonephritis versus cholecystitis. She is currently  scheduled to undergo elective laparoscopic cholecystectomy on August 09, 2006 with  Dr. Talmage Nap.   PAST MEDICAL HISTORY:  Significant for morbid obesity, tobacco abuse,  history of depression.   PAST MEDICAL HISTORY SUMMARY:  1. Type 2 diabetes since year 2000.  2. Polymyalgia rheumatica.  3. History of asthma.  4. History of pyelonephritis  in 1995.  5. History of gastroesophageal reflux disease.  6. History of kidney stones.  7. Tobacco abuse.  8. Osteoarthritis.  9. Hyperlipidemia.  10.Status post appendectomy in 1973.   CURRENT MEDICATIONS:  1. Flexeril 10 mg one q.h.s.  2. Hydroxychloroquine 200 mg b.i.d.  3. Glipizide 10 mg b.i.d. a.c.  4. Xanax 0.5 mg t.i.d.  5. Lopid 600 mg b.i.d.  6. Metformin 500 mg two  tablets b.i.d.  7. Elavil 50 mg one q.h.s.  8. Albuterol metered dose inhaler 2 puffs q.4 h. p.r.n.  9. __________ 4 mg 1-2 a day.  10.Ibuprofen p.r.n.  11.Vicodin 5/500 q.6-8 h. p.r.n.   ALLERGIES:  PENICILLIN causes itching.   SOCIAL HISTORY:  The patient is married. Is accompanied by her husband  today. She has been disabled since July 2001. She was a Paediatric nurse at [employer]   FAMILY HISTORY:  Mother deceased at age 31 secondary to ectopic pregnancy.  Father deceased at age 29 secondary to alcoholism, type 2 diabetes,  hypertension, and stroke. Positive family history:  Distant relative with  colon cancer and type 2 diabetes.   HABITS:  She does not drink. She currently smokes about a pack a day, and  she has been doing since age 20.   REVIEW OF ECHART:  She also has a history of genital warts and underwent  fulguration.   REVIEW OF SYSTEMS:  No fevers, chills. No HEENT symptoms. The patient does  have dyspnea on exertion, but denies any frank chest pain. Positive  recurrent heartburn. No nausea  or vomiting. Positive for abdominal pain as  noted above. No dysuria, frequency or urgency. All other systems negative.   PHYSICAL EXAMINATION:  VITAL SIGNS:  Height 5 feet 0 inches, weight 231  pounds, temperature 98, pulse 107, BP 147/90 in the left arm in the seated  position.  GENERAL:  The patient is a pleasant, morbidly obese 50 year old white female  who appears slightly older than stated age.  HEENT:  Normocephalic, atraumatic. Pupils are equal and reactive to light  bilaterally. Extraocular movements are intact. The patient was anicteric.  Conjunctivae was within normal limits. External auditory canals and tympanic  membranes were clear bilaterally. Hearing was grossly normal. Oropharyngeal  exam was unremarkable.  NECK:  Thick/a bull neck. No adenopathy, carotid bruit or thyromegaly.  CHEST:  Normal respiratory effort. Clear to auscultation bilaterally. No  rhonchi,  rubs or wheezing.  CARDIOVASCULAR:  Regular rate and rhythm. No significant murmurs, rubs or  gallops appreciated.  ABDOMEN:  Protuberant. There was vague diffuse tenderness. No organomegaly  appreciated.  EXTREMITIES:  No clubbing, cyanosis or edema.  SKIN:  Warm and dry.  NEUROLOGIC:  Cranial nerves II-XII was grossly intact. She was not focal.   IMPRESSION:  1. Abdominal pain presumed secondary to gallstones/  2. Gastroesophageal reflux disease.  3. Type 2 diabetes unknown status.  4. Hyperlipidemia.  5. History of asthma.  6. History of polymyalgia rheumatica.  7. Ongoing tobacco abuse.  8. Osteoarthritis.  9. Health maintenance.   RECOMMENDATIONS:  The patient is to follow up with Dr. Talmage Nap to undergo  preop testing. We performed an EKG in the office which showed normal sinus  rhythm at 96 beats per minute. The patient had somewhat low voltage QRS. The  patient had poor R wave progression.   She will likely need preop consult with anesthesiology. Due to her body  habitus the patient at higher risk for respiratory complications  perioperatively.   We will try to obtain records from a previous physician. She states that  hemoglobin A1c and lipids were performed within the last two or three  months. In addition we need to review workup performed for polymyalgia  rheumatica. Followup time is in approximately four weeks.       Barbette Hair. Artist Pais, DO      RDY/MedQ  DD:  08/08/2006  DT:  08/09/2006  Job #:  [id number]

## 2011-03-23 NOTE — Assessment & Plan Note (Signed)
[facility]                                 ON-CALL NOTE   NAME:[name], Sandra Ferrell                       MRN:          [id number]  DATE:12/12/2006                            DOB:          05/17/1961    Patient of Dr. Daphine Deutscher.  The patient's husband, Amiyah Shryock, calls  from [phone] at 5:48 p.m., complaining that the patient has been in  severe pain, and that they called the office today asking for pain  medicine, and was told something would be called in, and nothing was  called in.  I did get the patient on the phone, and she says she has  fibromyalgia, and other pain medications, and just needs something until  she gets into the office in the morning.  I explained that we could not  call in narcotics at night, and I did offer some Ultram, to call in just  a few to get her by until tomorrow morning.  She said that she would  accept that, 10 Ultram 50 one p.o. q.6 h. p.r.n. were called in to the  pharmacy.  The patient is to see Dr. Ermalene Searing in the morning if in pain.  If that does not work, or if she feels like she needs something  stronger, she should go to the emergency room tonight.     Lelon Perla, DO  Electronically Signed    Shawnie Dapper  DD: 12/12/2006  DT: 12/13/2006  Job #: [id number]   cc:   Kerby Nora, MD

## 2011-03-23 NOTE — Op Note (Signed)
Sandra Ferrell, Sandra Ferrell              ACCOUNT NO.:  [id number]   MEDICAL RECORD NO.:  [id number]          PATIENT TYPE:  AMB   LOCATION:  SDS                          FACILITY:  [facility]   PHYSICIAN:  Leonie Man, M.D.   DATE OF BIRTH:  02-19-1961   DATE OF PROCEDURE:  08/09/2006  DATE OF DISCHARGE:                                 OPERATIVE REPORT   PREOPERATIVE DIAGNOSIS:  Chronic calculus cholecystitis.   POSTOPERATIVE DIAGNOSIS:  Chronic calculus cholecystitis.   PROCEDURE:  Laparoscopic cholecystectomy with intraoperative cholangiogram.   SURGEON:  Leonie Man, M.D.   ASSISTANT:  Rose Phi. Maple Hudson, M.D.   ANESTHESIA:  General.   SPECIMENS TO THE LAB:  Gallbladder with stones.   ESTIMATED BLOOD LOSS:  Minimal.   COMPLICATIONS:  There were no complications.   DISPOSITION:  The patient returned to the PACU in excellent condition.   NOTE:  Sandra Ferrell is a 50 year old female presenting with upper  abdominal pain associated with nausea.  Gallbladder ultrasound documents  cholelithiasis.  Liver function studies are within normal limits.  The  patient comes to the operating room for laparoscopic cholecystectomy after  the risks and potential benefits of surgery had been fully discussed, all  questions answered and consent obtained.   DESCRIPTION OF PROCEDURE:  The patient was positioned supinely.  Following  induction of satisfactory general anesthesia, open laparoscopy created at  the umbilicus with insertion of a Hasson cannula.  The abdomen was  insufflated with carbon dioxide to 14 mmHg of pressure.  A camera was  inserted and visual exploration of the abdomen carried out.  The liver edges  were somewhat rounded and the gallbladder appeared to be uninflamed, but  with multiple stones within it.  None of the small or large intestines  viewed appeared to be abnormal.  The pelvic organs appeared to be normal.  There were some intra-abdominal adhesions.  Under direct  vision, epigastric  and lateral ports were placed.  The patient was placed in somewhat upright  reverse Trendelenburg position, tilted towards the left.  The gallbladder  was then grasped and dissection carried down in the region of the ampulla of  the gallbladder, with isolation of the cystic artery and cystic duct.  The  cystic artery was traced to the gallbladder and the cystic duct was traced  up to the cystic duct-gallbladder junction.  The cystic duct was clipped  proximally and opened and a cystic duct cholangiogram was carried out by  passing a Cook catheter into the abdomen, inserting this into the cystic  duct, through which 1/2 strength Hypaque was injected under fluoroscopic  guidance.  The resulting cholangiogram showed free flow of contrast into the  duodenum and normal hepatic radicals with no filling defects.  The  cholangiocatheter was then removed and the cystic duct was triply clipped  and then transected.  The cystic artery also was doubly clipped and  transected, and the gallbladder dissected free from the liver bed using  electrocautery and maintaining hemostasis throughout the entire course of  the dissection.  At the end of the dissection, the liver  bed was again  checked for hemostasis and noted to be dry.  The right upper quadrant was  then thoroughly irrigated with multiple aliquots of normal saline and  aspirated.  The gallbladder was placed in an Endopouch and retrieved through  the umbilical wound without difficulty.  The trocars were removed under  direct vision and the pneumoperitoneum allowed to deflate.  The wounds were  closed in layers as follows.  The umbilical in 2 layers with 0 Vicryl and 4-  0 Monocryl.  Epigastric and flank wounds closed with 4-0 Monocryl sutures.  All the wounds were reinforced with Steri-Strips.  Sterile dressings were  applied.  The anesthetic was reversed and the patient removed from the  operating room to the recovery room in  stable condition.  She tolerated the  procedure well.      Leonie Man, M.D.  Electronically Signed     PB/MEDQ  D:  08/09/2006  T:  08/10/2006  Job:  [id number]   cc:   Leonie Man, MD

## 2011-03-23 NOTE — Op Note (Signed)
NAME:  Sandra Ferrell, Sandra Ferrell                        ACCOUNT NO.:  [id number]   MEDICAL RECORD NO.:  [id number]                   PATIENT TYPE:  AMB   LOCATION:  SDC                                  FACILITY:  [facility]   PHYSICIAN:  Lesly Dukes, M.D.              DATE OF BIRTH:  1961-09-19   DATE OF PROCEDURE:  05/15/2004  DATE OF DISCHARGE:                                 OPERATIVE REPORT   PREOPERATIVE DIAGNOSES:  68. A 50 year old female with abnormal uterine bleeding, unable to do     endometrial office biopsy.  2. Longstanding diffuse anogenital warts.   POSTOPERATIVE DIAGNOSES:  89. A 50 year old female with abnormal uterine bleeding, unable to do     endometrial office biopsy.  2. Longstanding diffuse anogenital warts.   PROCEDURE:  D&C under ultrasound guidance and fulguration of genital warts.   SURGEON:  Lesly Dukes, M.D.   ASSISTANTMichele Mcalpine D. Rose, M.D.   ESTIMATED BLOOD LOSS:  Less than 30 mL.   COMPLICATIONS:  None.   PATHOLOGY:  Warts.   FINDINGS:  Uterus sounds to 8 cm verified with ultrasound guidance.  Minimal  tissue on D&C.  Diffuse anogenital warts with large area in the posterior  vestibule.   DESCRIPTION OF PROCEDURE:  After informed consent was obtained, the patient  was taken to the operating room where general anesthesia was found to be  adequate.  The patient was placed in the dorsal lithotomy position and  prepped and draped in the normal sterile fashion.  The bivalve speculum was  placed in the patient's vagina and the anterior lip of the cervix was  grasped with single-tooth tenaculum.  The uterus was sounded to 8 cm under  ultrasound guidance and gently dilated with Hegar dilators to a #7.  A small  sharp curette was then introduced into the uterus and gentle sharp curettage  was performed.  The tissue was sent to pathology.  The tenaculum was then  released and cervix was noted to be hemostatic.  The bivalve speculum was  then removed from  the patient's vagina.  Using lidocaine epinephrine  solution, the separate areas of the anogenital warts were infiltrated at  their base for hemostasis and to elevate the area that needed to be  fulgurated.  Several areas were biopsied and sent off to pathology.  These  included some worrisome areas by the anus and one on the left labia majora.  At this point, a needle tip on the Bovie was then placed and fulguration of  the warts was then begun.  Good hemostasis was noted from all areas.  The  areas that were biopsied needed several small interrupted sutures of 4-0  plain suture.  At the end  of the procedure, all areas were fulgurated that needed to be done.  Silvadene cream was then placed over her perineum.  The patient tolerated  the procedure well.  Sponge,  lap, needle and instrument counts correct x2.  The patient went to the recovery room in stable condition.                                               Lesly Dukes, M.D.    Lora Paula  D:  05/15/2004  T:  05/15/2004  Job:  [id number]
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
## 2011-03-26 ENCOUNTER — Other Ambulatory Visit: Payer: Self-pay | Admitting: *Deleted

## 2011-03-26 MED ORDER — ALPRAZOLAM 0.5 MG PO TABS: 0.5000 mg | ORAL_TABLET | Freq: Three times a day (TID) | ORAL | Status: DC | PRN

## 2011-03-26 MED ORDER — CYCLOBENZAPRINE HCL 10 MG PO TABS: 10.0000 mg | ORAL_TABLET | Freq: Every day | ORAL | Status: DC

## 2011-03-26 NOTE — Telephone Encounter (Signed)
rx called to pharmacy 

## 2011-04-10 ENCOUNTER — Ambulatory Visit (INDEPENDENT_AMBULATORY_CARE_PROVIDER_SITE_OTHER): Payer: Medicare Other | Admitting: Family Medicine

## 2011-04-10 ENCOUNTER — Encounter: Payer: Self-pay | Admitting: Family Medicine

## 2011-04-10 VITALS — BP 114/70 | HR 84 | Temp 98.9°F | Wt 227.0 lb

## 2011-04-10 DIAGNOSIS — N390 Urinary tract infection, site not specified: Secondary | ICD-10-CM

## 2011-04-10 LAB — POCT URINALYSIS DIPSTICK
Bilirubin, UA: NEGATIVE
Glucose, UA: NEGATIVE
Nitrite, UA: POSITIVE

## 2011-04-10 MED ORDER — CIPROFLOXACIN HCL 500 MG PO TABS: 500.0000 mg | ORAL_TABLET | Freq: Two times a day (BID) | ORAL | Status: AC

## 2011-04-10 MED ORDER — ONDANSETRON HCL 4 MG PO TABS: 4.0000 mg | ORAL_TABLET | Freq: Three times a day (TID) | ORAL | Status: DC | PRN

## 2011-04-10 NOTE — Patient Instructions (Signed)
Take the cipro for 10 days and use the zofran as needed for nausea.  Keep drinking plenty of fluids.  Let us know if you don't improve.  Take care.

## 2011-04-10 NOTE — Progress Notes (Signed)
3 days last week with R sided back pain, radiated to pelvic area.  Better with inc in fluids, but pain returned in the meantime.  Burning with urination.  Inc in sx since Friday.  Occ subjective fevers.  Nauseated but no vomiting.    Meds, vitals, and allergies reviewed.   ROS: See HPI.  Otherwise negative.    GEN: nad, alert and oriented HEENT: mucous membranes moist NECK: supple CV: rrr.  PULM: ctab, no inc wob ABD: soft, +bs, suprapubic area tender but no rebound, obese EXT: well perfused SKIN: no acute rash BACK: no CVA pain

## 2011-04-10 NOTE — Assessment & Plan Note (Addendum)
Likely cystitis, but with subj temp and back pain, I would treat for 10 days.  ucx and start cipro.  zofran prn for nausea.  Okay for outpatient fu.  She agrees, will notify MD of progression/lack of resolution of sx.

## 2011-04-12 LAB — URINE CULTURE: Colony Count: >=100000

## 2011-04-20 ENCOUNTER — Ambulatory Visit (INDEPENDENT_AMBULATORY_CARE_PROVIDER_SITE_OTHER): Payer: Medicare Other | Admitting: Family Medicine

## 2011-04-20 ENCOUNTER — Encounter: Payer: Self-pay | Admitting: Family Medicine

## 2011-04-20 ENCOUNTER — Ambulatory Visit (INDEPENDENT_AMBULATORY_CARE_PROVIDER_SITE_OTHER)
Admission: RE | Admit: 2011-04-20 | Discharge: 2011-04-20 | Disposition: A | Discharge status: Home / Self Care | Payer: Medicare Other | Source: Ambulatory Visit | Attending: Family Medicine | Admitting: Family Medicine

## 2011-04-20 ENCOUNTER — Other Ambulatory Visit: Payer: Self-pay | Admitting: *Deleted

## 2011-04-20 DIAGNOSIS — E785 Hyperlipidemia, unspecified: Secondary | ICD-10-CM

## 2011-04-20 DIAGNOSIS — M79609 Pain in unspecified limb: Secondary | ICD-10-CM

## 2011-04-20 DIAGNOSIS — I10 Essential (primary) hypertension: Secondary | ICD-10-CM

## 2011-04-20 DIAGNOSIS — N951 Menopausal and female climacteric states: Secondary | ICD-10-CM

## 2011-04-20 DIAGNOSIS — M79605 Pain in left leg: Secondary | ICD-10-CM | POA: Insufficient documentation

## 2011-04-20 DIAGNOSIS — E119 Type 2 diabetes mellitus without complications: Secondary | ICD-10-CM

## 2011-04-20 DIAGNOSIS — Z780 Asymptomatic menopausal state: Secondary | ICD-10-CM

## 2011-04-20 MED ORDER — ALPRAZOLAM 0.5 MG PO TABS: 0.5000 mg | ORAL_TABLET | Freq: Three times a day (TID) | ORAL | Status: DC | PRN

## 2011-04-20 MED ORDER — CYCLOBENZAPRINE HCL 10 MG PO TABS: 10.0000 mg | ORAL_TABLET | Freq: Two times a day (BID) | ORAL | Status: DC | PRN

## 2011-04-20 NOTE — Assessment & Plan Note (Signed)
Well controlled. Continue current medication.  

## 2011-04-20 NOTE — Patient Instructions (Signed)
Glucosamine 500 mg 1 to 3 times daily for joint pain.  Gentle stretching exercises of hips and legs . We will call you with bone density and X-ray results. We will call you with A1C diabetes blood test results as well.

## 2011-04-20 NOTE — Progress Notes (Signed)
Subjective:    Patient ID: Sandra Ferrell, female    DOB: 1960-12-27, 50 y.o.   MRN: [id number]  HPI Seen for UTI 6/5 treated with  Cipro, resolved  Chest pain in 02/2011... Cardiac stress test showed low risk   No further chest pain since.  Left leg painful and stiff at night x last 4 months. Hip down to ankle. Cramping in calf.  gradually worsening. Hurts in any position. Hip feels stiff to move. No swelling in that leg compared to other.  She is very worrried about it. 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  Fibromyalgia fairly well controlled on muscle relaxant, ibuprofen and tramadol.  Diabetes: previously well controlled, due for A1C Using medications without difficulties: Hypoglycemic episodes:None Hyperglycemic episodes:None Feet problems:None Blood Sugars averaging: 118, checking daily eye exam within last year:  Not due for cholesterol yet.  Hypertension:  Well controlled  Using medication without problems or lightheadedness:  Chest pain with exertion: Yes Edema:None Short of breath: Mild Average home BPs: Not checking  Post menopausal in last year, smoker, hx of being on prednisone for 4 years. No family history of osteoporosis. Age 29 for menses. Pt high risk fo osteoporosis.     Review of Systems  Constitutional: Negative for fever and fatigue.  HENT: Negative for ear pain.   Eyes: Negative for pain.  Respiratory: Negative for chest tightness and shortness of breath.   Cardiovascular: Negative for chest pain, palpitations and leg swelling.  Gastrointestinal: Negative for abdominal pain.  Genitourinary: Negative for dysuria.  Neurological: Negative for syncope, weakness, numbness and headaches.       No recent falls.       Objective:   Physical Exam  Constitutional: Vital signs are normal. She appears well-developed and well-nourished. She is cooperative.  Non-toxic appearance. She does  not appear ill. No distress.       Morbidly obese.  HENT:  Head: Normocephalic.  Right Ear: Hearing, tympanic membrane, external ear and ear canal normal. Tympanic membrane is not erythematous, not retracted and not bulging.  Left Ear: Hearing, tympanic membrane, external ear and ear canal normal. Tympanic membrane is not erythematous, not retracted and not bulging.  Nose: No mucosal edema or rhinorrhea. Right sinus exhibits no maxillary sinus tenderness and no frontal sinus tenderness. Left sinus exhibits no maxillary sinus tenderness and no frontal sinus tenderness.  Mouth/Throat: Uvula is midline, oropharynx is clear and moist and mucous membranes are normal.  Eyes: Conjunctivae, EOM and lids are normal. Pupils are equal, round, and reactive to light. No foreign bodies found.  Neck: Trachea normal and normal range of motion. Neck supple. Carotid bruit is not present. No mass and no thyromegaly present.  Cardiovascular: Normal rate, regular rhythm, S1 normal, S2 normal, normal heart sounds, intact distal pulses and normal pulses.  Exam reveals no gallop and no friction rub.   No murmur heard. Pulmonary/Chest: Effort normal and breath sounds normal. Not tachypneic. No respiratory distress. She has no decreased breath sounds. She has no wheezes. She has no rhonchi. She has no rales.  Abdominal: Soft. Normal appearance and bowel sounds are normal. There is no tenderness.  Musculoskeletal:       Left hip: She exhibits decreased range of motion and tenderness. She exhibits no swelling.       Left knee: Normal. She exhibits normal range of motion, no swelling, no effusion and no deformity.  Left ankle: Normal. She exhibits normal range of motion.       Lumbar back: She exhibits tenderness.       No swelling in left leg  Mild tenderness in low back, no radicular symptoms, neg SLR on left  Positive Faber's on left for pain in anterior hip  Neurological: She is alert.  Skin: Skin is warm, dry  and intact. No rash noted.  Psychiatric: Her speech is normal and behavior is normal. Judgment and thought content normal. Her mood appears not anxious. Cognition and memory are normal. She does not exhibit a depressed mood.       Diabetic foot exam: Normal inspection No skin breakdown No calluses  Normal DP pulses Normal sensation to light touch and monofilament Nails normal    Assessment & Plan:

## 2011-04-20 NOTE — Telephone Encounter (Signed)
rx called to pharmacy 

## 2011-04-20 NOTE — Assessment & Plan Note (Signed)
Due for reeval. Currently unable to exercise given leg pain

## 2011-04-20 NOTE — Assessment & Plan Note (Signed)
LDL previously at goal... Check in 3 months.

## 2011-04-20 NOTE — Telephone Encounter (Signed)
Let pt know that hip X-ray looks good. No clear arthritis seen.  Second most likely possiblilt is trochanteric bursitis (inflammation in hip fluid filled sac) given where she is tender or pain referred fro her back.  I would have her stop ibuprofen and we can try a stronger antiinflammatory such as meloxicam as long as it does not hurt her stomach. Let me know if agreeable.  If pain not improving in 1-2 weeks, make appt with Dr. Patsy Lager for further eval of hip and possible injection in hip bursa if he sees fit.

## 2011-04-20 NOTE — Assessment & Plan Note (Signed)
Focally pain and stiffness mainly over left hip. Will eval with films. Start glucosamine and gentle stretching exercises.  Pt with high loevel of concern about bone density.. Explained that this will not cause pain unless fracture... Given she is high risk we will eval a bone density in her.

## 2011-04-23 ENCOUNTER — Other Ambulatory Visit: Payer: Self-pay | Admitting: Family Medicine

## 2011-04-25 ENCOUNTER — Ambulatory Visit (HOSPITAL_COMMUNITY)
Admission: RE | Admit: 2011-04-25 | Discharge: 2011-04-25 | Disposition: A | Discharge status: Home / Self Care | Payer: Medicare Other | Source: Ambulatory Visit | Attending: Family Medicine | Admitting: Family Medicine

## 2011-04-25 ENCOUNTER — Other Ambulatory Visit: Payer: Self-pay | Admitting: Family Medicine

## 2011-04-25 DIAGNOSIS — Z1231 Encounter for screening mammogram for malignant neoplasm of breast: Secondary | ICD-10-CM

## 2011-04-25 DIAGNOSIS — Z13820 Encounter for screening for osteoporosis: Secondary | ICD-10-CM | POA: Insufficient documentation

## 2011-04-25 DIAGNOSIS — Z780 Asymptomatic menopausal state: Secondary | ICD-10-CM

## 2011-04-25 DIAGNOSIS — IMO0001 Reserved for inherently not codable concepts without codable children: Secondary | ICD-10-CM | POA: Insufficient documentation

## 2011-04-25 DIAGNOSIS — M1990 Unspecified osteoarthritis, unspecified site: Secondary | ICD-10-CM | POA: Insufficient documentation

## 2011-04-26 ENCOUNTER — Ambulatory Visit (HOSPITAL_COMMUNITY)
Admission: RE | Admit: 2011-04-26 | Discharge: 2011-04-26 | Disposition: A | Discharge status: Home / Self Care | Payer: Medicare Other | Source: Ambulatory Visit | Attending: Family Medicine | Admitting: Family Medicine

## 2011-04-26 DIAGNOSIS — Z1231 Encounter for screening mammogram for malignant neoplasm of breast: Secondary | ICD-10-CM | POA: Insufficient documentation

## 2011-04-29 ENCOUNTER — Other Ambulatory Visit: Payer: Self-pay | Admitting: Family Medicine

## 2011-05-01 ENCOUNTER — Encounter: Payer: Self-pay | Admitting: *Deleted

## 2011-05-02 ENCOUNTER — Encounter: Payer: Self-pay | Admitting: Family Medicine

## 2011-05-03 ENCOUNTER — Telehealth: Payer: Self-pay | Admitting: *Deleted

## 2011-05-03 NOTE — Telephone Encounter (Signed)
Pt is calling to get the results of her hip x-ray done last week, please advise.

## 2011-05-03 NOTE — Telephone Encounter (Signed)
Notify pt that hip X-rays were normal. Continue as discussed at appt and follow up if not improving in 2 weeks.

## 2011-05-04 ENCOUNTER — Other Ambulatory Visit: Payer: Self-pay | Admitting: *Deleted

## 2011-05-04 MED ORDER — TRAMADOL HCL 50 MG PO TABS: 100.0000 mg | ORAL_TABLET | Freq: Three times a day (TID) | ORAL | Status: DC | PRN

## 2011-05-04 NOTE — Telephone Encounter (Signed)
Patient advised.

## 2011-05-12 LAB — HM DIABETES EYE EXAM

## 2011-05-14 ENCOUNTER — Other Ambulatory Visit: Payer: Self-pay | Admitting: *Deleted

## 2011-05-14 MED ORDER — BUDESONIDE-FORMOTEROL FUMARATE 80-4.5 MCG/ACT IN AERO: 2.0000 | INHALATION_SPRAY | Freq: Two times a day (BID) | RESPIRATORY_TRACT | Status: DC

## 2011-05-20 ENCOUNTER — Other Ambulatory Visit: Payer: Self-pay | Admitting: Family Medicine

## 2011-05-21 ENCOUNTER — Other Ambulatory Visit: Payer: Self-pay | Admitting: *Deleted

## 2011-05-22 MED ORDER — ALPRAZOLAM 0.5 MG PO TABS: 0.5000 mg | ORAL_TABLET | Freq: Three times a day (TID) | ORAL | Status: DC | PRN

## 2011-05-22 NOTE — Telephone Encounter (Signed)
rx called to pharmacy 

## 2011-05-24 ENCOUNTER — Encounter: Payer: Self-pay | Admitting: Family Medicine

## 2011-06-19 ENCOUNTER — Other Ambulatory Visit: Payer: Self-pay | Admitting: Family Medicine

## 2011-06-19 MED ORDER — ALPRAZOLAM 0.5 MG PO TABS: 0.5000 mg | ORAL_TABLET | Freq: Three times a day (TID) | ORAL | Status: DC | PRN

## 2011-06-19 NOTE — Telephone Encounter (Signed)
Last refill 7/16 and 7/15 respectively

## 2011-06-20 NOTE — Telephone Encounter (Signed)
rx called to pharmacy 

## 2011-07-16 ENCOUNTER — Other Ambulatory Visit: Payer: Self-pay | Admitting: Family Medicine

## 2011-07-17 ENCOUNTER — Other Ambulatory Visit (INDEPENDENT_AMBULATORY_CARE_PROVIDER_SITE_OTHER): Payer: Medicare Other

## 2011-07-17 DIAGNOSIS — E7800 Pure hypercholesterolemia, unspecified: Secondary | ICD-10-CM

## 2011-07-17 DIAGNOSIS — E119 Type 2 diabetes mellitus without complications: Secondary | ICD-10-CM

## 2011-07-17 LAB — HEMOGLOBIN A1C: Hgb A1c MFr Bld: 6.5 % (ref 4.6–6.5)

## 2011-07-18 LAB — LIPID PANEL
Cholesterol: 87 mg/dL (ref 0–200)
LDL Cholesterol: 28 mg/dL (ref 0–99)
Triglycerides: 157 mg/dL — ABNORMAL HIGH (ref 0.0–149.0)

## 2011-07-18 LAB — COMPREHENSIVE METABOLIC PANEL
AST: 23 U/L (ref 0–37)
BUN: 25 mg/dL — ABNORMAL HIGH (ref 6–23)
CO2: 23 mEq/L (ref 19–32)
Calcium: 9.4 mg/dL (ref 8.4–10.5)
Chloride: 105 mEq/L (ref 96–112)
Creatinine, Ser: 0.9 mg/dL (ref 0.4–1.2)
GFR: 71.02 mL/min (ref >60.00)
Glucose, Bld: 103 mg/dL — ABNORMAL HIGH (ref 70–99)

## 2011-07-18 MED ORDER — ALPRAZOLAM 0.5 MG PO TABS: 0.5000 mg | ORAL_TABLET | Freq: Three times a day (TID) | ORAL | Status: DC | PRN

## 2011-07-18 MED ORDER — IBUPROFEN 800 MG PO TABS: 800.0000 mg | ORAL_TABLET | Freq: Three times a day (TID) | ORAL | Status: DC | PRN

## 2011-07-18 NOTE — Telephone Encounter (Signed)
rx called to pharmacy 

## 2011-07-23 ENCOUNTER — Ambulatory Visit (INDEPENDENT_AMBULATORY_CARE_PROVIDER_SITE_OTHER): Payer: Medicare Other | Admitting: Family Medicine

## 2011-07-23 ENCOUNTER — Other Ambulatory Visit (HOSPITAL_COMMUNITY)
Admission: RE | Admit: 2011-07-23 | Discharge: 2011-07-23 | Disposition: A | Discharge status: Home / Self Care | Payer: Medicare Other | Source: Ambulatory Visit | Attending: Family Medicine | Admitting: Family Medicine

## 2011-07-23 ENCOUNTER — Encounter: Payer: Self-pay | Admitting: Gastroenterology

## 2011-07-23 ENCOUNTER — Encounter: Payer: Self-pay | Admitting: Family Medicine

## 2011-07-23 DIAGNOSIS — Z01419 Encounter for gynecological examination (general) (routine) without abnormal findings: Secondary | ICD-10-CM

## 2011-07-23 DIAGNOSIS — I10 Essential (primary) hypertension: Secondary | ICD-10-CM

## 2011-07-23 DIAGNOSIS — Z124 Encounter for screening for malignant neoplasm of cervix: Secondary | ICD-10-CM

## 2011-07-23 DIAGNOSIS — F329 Major depressive disorder, single episode, unspecified: Secondary | ICD-10-CM

## 2011-07-23 DIAGNOSIS — Z1159 Encounter for screening for other viral diseases: Secondary | ICD-10-CM | POA: Insufficient documentation

## 2011-07-23 DIAGNOSIS — J209 Acute bronchitis, unspecified: Secondary | ICD-10-CM | POA: Insufficient documentation

## 2011-07-23 DIAGNOSIS — Z1211 Encounter for screening for malignant neoplasm of colon: Secondary | ICD-10-CM

## 2011-07-23 DIAGNOSIS — Z0000 Encounter for general adult medical examination without abnormal findings: Secondary | ICD-10-CM

## 2011-07-23 DIAGNOSIS — F3289 Other specified depressive episodes: Secondary | ICD-10-CM

## 2011-07-23 DIAGNOSIS — E785 Hyperlipidemia, unspecified: Secondary | ICD-10-CM

## 2011-07-23 DIAGNOSIS — IMO0001 Reserved for inherently not codable concepts without codable children: Secondary | ICD-10-CM

## 2011-07-23 DIAGNOSIS — F411 Generalized anxiety disorder: Secondary | ICD-10-CM

## 2011-07-23 DIAGNOSIS — E119 Type 2 diabetes mellitus without complications: Secondary | ICD-10-CM

## 2011-07-23 LAB — HM DIABETES FOOT EXAM

## 2011-07-23 MED ORDER — GUAIFENESIN-CODEINE 100-10 MG/5ML PO SYRP: 5.0000 mL | ORAL_SOLUTION | Freq: Every day | ORAL | Status: AC

## 2011-07-23 MED ORDER — AZITHROMYCIN 250 MG PO TABS: ORAL_TABLET | ORAL | Status: AC

## 2011-07-23 NOTE — Assessment & Plan Note (Signed)
Trig almost at goal on lopid and fish oil. LDL at goal.

## 2011-07-23 NOTE — Patient Instructions (Addendum)
Call insurance about Victoza or Byetta coverage... As these would be a good option to help with weight loss.  Stop at front desk to set up colonoscopy with [facility]. No clear asthma exacerbation at this time.  Given symptoms > 1 week.. Start antibiotic. Use inhaler prn. Call if SOB increasing.  Use cough suppressant as needed.

## 2011-07-23 NOTE — Assessment & Plan Note (Signed)
Stable control on tramadol, muscle relaxant, NSAID and elavil.

## 2011-07-23 NOTE — Assessment & Plan Note (Signed)
Stable control on zanax BID, trying to limit use. Follow up in 6 months.

## 2011-07-23 NOTE — Assessment & Plan Note (Signed)
Well controlled. Continue current medication.  

## 2011-07-23 NOTE — Assessment & Plan Note (Signed)
No clear asthma exacerbation at this time.  Given symptoms > 1 week.. Start antibiotic. Use inhaler prn. Call if SOB increasing.

## 2011-07-23 NOTE — Progress Notes (Signed)
Subjective:    Patient ID: Sandra Ferrell, female    DOB: September 18, 1961, 50 y.o.   MRN: [id number]  HPI I have personally reviewed the Medicare Annual Wellness questionnaire and have noted 1. The patient's medical and social history 2. Their use of alcohol, tobacco or illicit drugs 3. Their current medications and supplements 4. The patient's functional ability including ADL's, fall risks, home safety risks and hearing or visual             impairment. 5. Diet and physical activities 6. Evidence for depression or mood disorders The patients weight, height, BMI and visual acuity have been recorded in the chart I have made referrals, counseling and provided education to the patient based review of the above and I have provided the pt with a written personalized care plan for preventive services.   Diabetes: well controlled on  Actos, glucotrol, glucophage Lab Results  Component Value Date   HGBA1C 6.5 07/17/2011  Using medications without difficulties:None Hypoglycemic episodes:None Hyperglycemic episodes:None Feet problems:None Blood Sugars averaging: Checking daily to twice a day. eye exam within last year:yes  Hypertension:  BP at goal on  lotensin Using medication without problems or lightheadedness:  Chest pain with exertion:None Edema:no change Short of breath: See below. Average home BPs: not checking Other issues: Walking daily  Elevated Cholesterol: On lopid  And fish oilwith mild trig elevation at this point. LDL at goal with diet <100. Using medications without problems:None Muscle aches: None Other complaints:  1 week ago noted congestion, cough, productive. Difficulty sleeping due the cough.  Hx of asthma. Mild SOB, mild wheeze. No fever. No ear pain, no face pain.  She cares for child with cold.  Using tylenol sinus Still smoking, but has decreased down some.  Fibromyalgia: Using flexeril 2 times a day. On elavil at bedtime. Using tramadol for pain, every  few days... Using ibuprofen occ if milder pain. No Stomach irritation.  Depression, anxiety, stable. On elavil. Using xanax twice a day.  Review of Systems  Constitutional: Negative for fever, fatigue and unexpected weight change.  HENT: Negative for ear pain, congestion, sore throat, sneezing, trouble swallowing and sinus pressure.   Eyes: Negative for pain and itching.  Respiratory: Negative for cough, shortness of breath and wheezing.   Cardiovascular: Negative for chest pain, palpitations and leg swelling.  Gastrointestinal: Negative for nausea, abdominal pain, diarrhea, constipation and blood in stool.  Genitourinary: Negative for dysuria, hematuria, vaginal discharge, difficulty urinating and menstrual problem.  Musculoskeletal:       Body pain... Wants to lose weigh to help with fibromyalgia control.  Skin: Negative for rash.  Neurological: Negative for syncope, weakness, light-headedness, numbness and headaches.  Psychiatric/Behavioral: Negative for confusion and dysphoric mood. The patient is not nervous/anxious.        Objective:   Physical Exam  Constitutional: Vital signs are normal. She appears well-developed and well-nourished. She is cooperative.  Non-toxic appearance. She does not appear ill. No distress.       obese  HENT:  Head: Normocephalic.  Right Ear: Hearing, tympanic membrane, external ear and ear canal normal.  Left Ear: Hearing, tympanic membrane, external ear and ear canal normal.  Mouth/Throat: Oropharynx is clear and moist.       Mucus in nares  Eyes: Conjunctivae, EOM and lids are normal. Pupils are equal, round, and reactive to light. No foreign bodies found.  Neck: Trachea normal and normal range of motion. Neck supple. Carotid bruit is not present. No mass and  no thyromegaly present.  Cardiovascular: Normal rate, regular rhythm, S1 normal, S2 normal, normal heart sounds and intact distal pulses.  Exam reveals no gallop.   No murmur  heard. Pulmonary/Chest: Effort normal and breath sounds normal. No respiratory distress. She has no wheezes. She has no rhonchi. She has no rales.       Frequent cough  Abdominal: Soft. Normal appearance and bowel sounds are normal. She exhibits no distension, no fluid wave, no abdominal bruit and no mass. There is no hepatosplenomegaly. There is no tenderness. There is no rebound, no guarding and no CVA tenderness. No hernia.  Genitourinary: Rectum normal, vagina normal and uterus normal. No breast swelling, tenderness, discharge or bleeding. Pelvic exam was performed with patient prone. There is no rash, tenderness or lesion on the right labia. There is no rash, tenderness or lesion on the left labia. Uterus is not enlarged and not tender. Cervix exhibits no motion tenderness, no discharge and no friability. Right adnexum displays no mass, no tenderness and no fullness. Left adnexum displays no mass, no tenderness and no fullness.  Lymphadenopathy:    She has no cervical adenopathy.    She has no axillary adenopathy.  Neurological: She is alert. She has normal strength. No cranial nerve deficit or sensory deficit.  Skin: Skin is warm, dry and intact. No rash noted.  Psychiatric: Her speech is normal and behavior is normal. Judgment and thought content normal. Her mood appears not anxious. Cognition and memory are normal. She does not exhibit a depressed mood.       Flat affect, but laughs when appropriate          Assessment & Plan:  Annual Medicare Wellness: The patient's preventative maintenance and recommended screening tests for an annual wellness exam were reviewed in full today. Brought up to date unless services declined.  Counselled on the importance of diet, exercise, and its role in overall health and mortality. The patient's FH and SH was reviewed, including their home life, tobacco status, and drug and alcohol status.   Will get flu vaccine when feeling better. Up to date with  tetanus and PNA vac.  Nml mammo 04/2011. Colonoscopy: Will set up. Pap and DVE this year done... Then on q3 year schedule.

## 2011-07-26 ENCOUNTER — Encounter: Payer: Self-pay | Admitting: *Deleted

## 2011-07-30 ENCOUNTER — Other Ambulatory Visit: Payer: Self-pay | Admitting: *Deleted

## 2011-07-31 MED ORDER — TRAMADOL HCL 50 MG PO TABS: 100.0000 mg | ORAL_TABLET | Freq: Three times a day (TID) | ORAL | Status: DC | PRN

## 2011-08-13 ENCOUNTER — Other Ambulatory Visit: Payer: Self-pay | Admitting: Family Medicine

## 2011-08-14 ENCOUNTER — Ambulatory Visit (AMBULATORY_SURGERY_CENTER): Payer: Medicare Other | Admitting: *Deleted

## 2011-08-14 ENCOUNTER — Other Ambulatory Visit: Payer: Self-pay | Admitting: *Deleted

## 2011-08-14 VITALS — Ht 60.0 in | Wt 225.0 lb

## 2011-08-14 DIAGNOSIS — Z1211 Encounter for screening for malignant neoplasm of colon: Secondary | ICD-10-CM

## 2011-08-14 MED ORDER — ALPRAZOLAM 0.5 MG PO TABS: 0.5000 mg | ORAL_TABLET | Freq: Three times a day (TID) | ORAL | Status: DC | PRN

## 2011-08-14 MED ORDER — PEG-KCL-NACL-NASULF-NA ASC-C 100 G PO SOLR: ORAL | Status: DC

## 2011-08-14 NOTE — Telephone Encounter (Signed)
Received faxed refill request from pharmacy. Is it okay to refill Alprazolam? 

## 2011-08-14 NOTE — Telephone Encounter (Signed)
Received refill request electronically from pharmacy. Is it okay to refill mes?

## 2011-08-14 NOTE — Telephone Encounter (Signed)
Rx called to pharmacy as instructed. 

## 2011-08-18 ENCOUNTER — Other Ambulatory Visit: Payer: Self-pay | Admitting: Family Medicine

## 2011-08-22 ENCOUNTER — Other Ambulatory Visit: Payer: Medicare Other | Admitting: Gastroenterology

## 2011-08-28 ENCOUNTER — Other Ambulatory Visit: Payer: Self-pay | Admitting: Family Medicine

## 2011-09-09 ENCOUNTER — Other Ambulatory Visit: Payer: Self-pay | Admitting: Family Medicine

## 2011-09-11 ENCOUNTER — Encounter: Payer: Self-pay | Admitting: Gastroenterology

## 2011-09-11 ENCOUNTER — Ambulatory Visit (AMBULATORY_SURGERY_CENTER): Payer: Medicare Other | Admitting: Gastroenterology

## 2011-09-11 ENCOUNTER — Other Ambulatory Visit: Payer: Self-pay | Admitting: *Deleted

## 2011-09-11 VITALS — BP 124/62 | HR 105 | Temp 97.2°F | Resp 12 | Ht 60.0 in | Wt 225.0 lb

## 2011-09-11 DIAGNOSIS — D126 Benign neoplasm of colon, unspecified: Secondary | ICD-10-CM

## 2011-09-11 DIAGNOSIS — Z1211 Encounter for screening for malignant neoplasm of colon: Secondary | ICD-10-CM

## 2011-09-11 LAB — GLUCOSE, CAPILLARY: Glucose-Capillary: 94 mg/dL (ref 70–99)

## 2011-09-11 MED ORDER — SODIUM CHLORIDE 0.9 % IV SOLN: 500.0000 mL | INTRAVENOUS | Status: DC

## 2011-09-11 MED ORDER — ALPRAZOLAM 0.5 MG PO TABS: 0.5000 mg | ORAL_TABLET | Freq: Three times a day (TID) | ORAL | Status: DC | PRN

## 2011-09-11 NOTE — Progress Notes (Signed)
There was no specimen taken.   Used to reach the cecum.    Again..ACE inhibitor therapy was not prescribed due to . polyp

## 2011-09-11 NOTE — Telephone Encounter (Signed)
rx called to pharmacy 

## 2011-09-12 ENCOUNTER — Telehealth: Payer: Self-pay

## 2011-09-12 NOTE — Telephone Encounter (Signed)

## 2011-09-13 ENCOUNTER — Encounter: Payer: Self-pay | Admitting: *Deleted

## 2011-10-07 ENCOUNTER — Other Ambulatory Visit: Payer: Self-pay | Admitting: Family Medicine

## 2011-10-16 ENCOUNTER — Other Ambulatory Visit: Payer: Self-pay | Admitting: Family Medicine

## 2011-11-01 ENCOUNTER — Other Ambulatory Visit: Payer: Self-pay | Admitting: Family Medicine

## 2011-11-04 ENCOUNTER — Other Ambulatory Visit: Payer: Self-pay | Admitting: Family Medicine

## 2011-12-14 ENCOUNTER — Other Ambulatory Visit: Payer: Self-pay | Admitting: *Deleted

## 2011-12-14 MED ORDER — PIOGLITAZONE HCL 15 MG PO TABS: 15.0000 mg | ORAL_TABLET | Freq: Every day | ORAL | Status: DC

## 2012-02-14 ENCOUNTER — Other Ambulatory Visit: Payer: Self-pay | Admitting: *Deleted

## 2012-02-14 MED ORDER — ALBUTEROL SULFATE HFA 108 (90 BASE) MCG/ACT IN AERS: 2.0000 | INHALATION_SPRAY | RESPIRATORY_TRACT | Status: DC | PRN

## 2012-02-21 ENCOUNTER — Ambulatory Visit
Admission: RE | Admit: 2012-02-21 | Discharge: 2012-02-21 | Disposition: A | Discharge status: Home / Self Care | Payer: Medicare Other | Source: Ambulatory Visit | Attending: Internal Medicine | Admitting: Internal Medicine

## 2012-02-21 ENCOUNTER — Other Ambulatory Visit: Payer: Self-pay | Admitting: Internal Medicine

## 2012-02-21 DIAGNOSIS — M549 Dorsalgia, unspecified: Secondary | ICD-10-CM

## 2012-02-22 ENCOUNTER — Other Ambulatory Visit: Payer: Self-pay | Admitting: Internal Medicine

## 2012-02-22 DIAGNOSIS — M545 Low back pain: Secondary | ICD-10-CM

## 2012-02-27 ENCOUNTER — Telehealth: Payer: Self-pay | Admitting: Family Medicine

## 2012-02-27 ENCOUNTER — Other Ambulatory Visit: Payer: Medicare Other

## 2012-02-27 NOTE — Telephone Encounter (Signed)
She is a complicated pt with anxiety, depression, chronic pain, fibro but I never had issue with her abusing staff or meds. Okay to have her come back.

## 2012-02-27 NOTE — Telephone Encounter (Signed)
Patient has been seeing another doctor for the past year.  She called and asked if she could come back and see Dr.Bedsole.  She didn't transfer her records or change her insurance.  Patient is having back pain.  Would you see patient for her back pain and then she'll see Dr.Bedsole when she comes back?

## 2012-02-27 NOTE — Telephone Encounter (Signed)
I would like to get Dr. Daphine Deutscher input on this situation in case there is more that I do not know about.  Completely up to Dr. B --- if she is willing to take her back as a patient, I will be happy to see her acutely.  Please let Herbert Seta know what you decide

## 2012-02-29 ENCOUNTER — Encounter: Payer: Self-pay | Admitting: Family Medicine

## 2012-02-29 ENCOUNTER — Ambulatory Visit (INDEPENDENT_AMBULATORY_CARE_PROVIDER_SITE_OTHER): Payer: Medicare Other | Admitting: Family Medicine

## 2012-02-29 ENCOUNTER — Other Ambulatory Visit: Payer: Medicare Other

## 2012-02-29 VITALS — BP 120/72 | HR 94 | Temp 97.5°F | Ht 60.0 in | Wt 236.0 lb

## 2012-02-29 DIAGNOSIS — R937 Abnormal findings on diagnostic imaging of other parts of musculoskeletal system: Secondary | ICD-10-CM

## 2012-02-29 DIAGNOSIS — M549 Dorsalgia, unspecified: Secondary | ICD-10-CM

## 2012-02-29 NOTE — Progress Notes (Signed)
Patient Name: Sandra Ferrell Date of Birth: 08-31-1961 Age: 51 y.o. Medical Record Number: [id number] Gender: female Date of Encounter: 02/29/2012  History of Present Illness:  Sandra Ferrell is a 51 y.o. very pleasant female patient who presents with the following:  Acute back pain:  2011, was hurting really bad in her back.  Last week fell down her daughter in law's steps, and twisted around. Lost her balance and fell backwards. Since then she has been having bad back pain, has had to take motrin 800 mg, 6 times a day. Also taking Tramadol 50 mg, 6 times a day.  No bowel or bladder incont. No weakness, no anesthesia.  Xrays obtained last week, patient was supposed to get f/u MRI, but dispo did not happen. Concern with abnormality at L5, posterior lucency.  Dg Lumbar Spine Complete  02/21/2012  *RADIOLOGY REPORT*  Clinical Data: Larey Seat 1 week ago with low back pain  LUMBAR SPINE - COMPLETE 4+ VIEW  Comparison: None.  Findings: There is a 5 mm anterolisthesis of L4 on L5 most likely degenerative in origin.  Degenerative disc disease is present in the lower thoracic spine as well as at the L1-2 level.  No compression deformity is seen.  There is a linear lucency noted on the spot film over L5-S1 involving the posterior elements of L5 and a fracture cannot be excluded.  CT or MRI of the lumbar spine is recommended if further assessment is warranted.  The SI joints appear normal.  Surgical clips are present in the right upper quadrant from prior cholecystectomy.  IMPRESSION:  1.  5 mm anterolisthesis of L4 on L5 most likely degenerative in origin. 2.  No acute compression deformity.  Degenerative disc disease at L1-2 and throughout the lower thoracic spine. 3.  Questionable linear lucency involving the posterior elements of L5.  Cannot exclude fracture.  Consider CT or MRI to assess further.  Original Report Authenticated By: Juline Patch, M.D.    Dr. Sander [facility], ordered  XR and MRI of back last week.   Past Medical History, Surgical History, Social History, Family History, Problem List, Medications, and Allergies have been reviewed and updated if relevant.  Review of Systems:  GEN: No fevers, chills. Nontoxic. Primarily MSK c/o today. MSK: Detailed in the HPI GI: tolerating PO intake without difficulty Neuro: detailed above Otherwise the pertinent positives of the ROS are noted above.    Physical Examination: Filed Vitals:   02/29/12 0804  BP: 120/72  Pulse: 94  Temp: 97.5 F (36.4 C)  TempSrc: Oral  Height: 5' (1.524 m)  Weight: 236 lb (107.049 kg)  SpO2: 97%    Body mass index is 46.09 kg/(m^2).   GEN: Well-developed,well-nourished,in no acute distress; alert,appropriate and cooperative throughout examination HEENT: Normocephalic and atraumatic without obvious abnormalities. Ears, externally no deformities PULM: Breathing comfortably in no respiratory distress EXT: No clubbing, cyanosis, or edema PSYCH: Normally interactive. Cooperative during the interview. Pleasant. Friendly and conversant. Not anxious or depressed appearing. Normal, full affect.  Range of motion at  the waist: Flexion, extension, lateral bending and rotation: minimally able to bend  No echymosis or edema Rises to examination table with mild difficulty Gait: mildly antalgic  Inspection/Deformity: N Paraspinus Tenderness: diffuse bilateral Pain at spinous process L4-s1  B Ankle Dorsiflexion (L5,4): 5/5 B Great Toe Dorsiflexion (L5,4): 5/5 Heel Walk (L5): WNL Toe Walk (S1): WNL Rise/Squat (L4): WNL, mild pain  SENSORY B Medial Foot (L4): WNL B Dorsum (L5):  WNL B Lateral (S1): WNL Light Touch: WNL Pinprick: WNL  REFLEXES Knee (L4): 2+ Ankle (S1): 2+  B SLR, seated: neg B SLR, supine: neg B FABER: neg B Greater Troch: mild ttp B Log Roll: neg B Sciatic Notch: mild TTP   Assessment and Plan:  1. Back pain  MR Lumbar Spine Wo Contrast  2. Abnormal  x-ray of lumbar spine  MR Lumbar Spine Wo Contrast   Obtain an MRI of the lumbar spine to better delineate the lucency seen at L5 on multiple views of the plain film. I agree with radiologist, and feel this needs to be better visualized to help differentiate acute management. MRI preferable to determine if adjacent T2 signal will indicate acute bony injury from fall down the stairs rather than old. Cannot exclude fracture.   Scheduled Motrin 800 mg tid Tramadol 50 mg, 6-8 times daily if needed for now Tylenol prn  Orders Today: Orders Placed This Encounter  Procedures  . MR Lumbar Spine Wo Contrast    Standing Status: Future     Number of Occurrences:      Standing Expiration Date: 04/30/2013    WT-236/NOT CLAUS/PREV XR LUMBAR AT 315 SITE/NO NEEDS PER OFFICE/EPIC ORDER/[facility]    Order Specific Question:  Is the patient pregnant?    Answer:  No    Order Specific Question:  Does the patient have a pacemaker, internal devices, implants, aneury    Answer:  No    Order Specific Question:  Preferred imaging location?    Answer:  [facility] copy Specific Question:  Reason for exam:    Answer:  back pain, fell down stairs, abnormal lspine xray    Medications Today: Meds ordered this encounter  Medications  . pioglitazone (ACTOS) 30 MG tablet    Sig:

## 2012-02-29 NOTE — Patient Instructions (Signed)
REFERRAL: GO THE THE FRONT ROOM AT THE ENTRANCE OF OUR CLINIC, NEAR CHECK IN. ASK FOR MARION. SHE WILL HELP YOU SET UP YOUR REFERRAL. DATE: TIME:  

## 2012-03-01 ENCOUNTER — Ambulatory Visit (HOSPITAL_COMMUNITY)
Admission: RE | Admit: 2012-03-01 | Discharge: 2012-03-01 | Disposition: A | Discharge status: Home / Self Care | Payer: Medicare Other | Source: Ambulatory Visit | Attending: Family Medicine | Admitting: Family Medicine

## 2012-03-01 DIAGNOSIS — M549 Dorsalgia, unspecified: Secondary | ICD-10-CM

## 2012-03-01 DIAGNOSIS — M48061 Spinal stenosis, lumbar region without neurogenic claudication: Secondary | ICD-10-CM | POA: Insufficient documentation

## 2012-03-01 DIAGNOSIS — IMO0002 Reserved for concepts with insufficient information to code with codable children: Secondary | ICD-10-CM | POA: Insufficient documentation

## 2012-03-01 DIAGNOSIS — M5137 Other intervertebral disc degeneration, lumbosacral region: Secondary | ICD-10-CM | POA: Insufficient documentation

## 2012-03-01 DIAGNOSIS — M129 Arthropathy, unspecified: Secondary | ICD-10-CM | POA: Insufficient documentation

## 2012-03-01 DIAGNOSIS — R937 Abnormal findings on diagnostic imaging of other parts of musculoskeletal system: Secondary | ICD-10-CM

## 2012-03-01 DIAGNOSIS — M51379 Other intervertebral disc degeneration, lumbosacral region without mention of lumbar back pain or lower extremity pain: Secondary | ICD-10-CM | POA: Insufficient documentation

## 2012-03-03 ENCOUNTER — Telehealth: Payer: Self-pay

## 2012-03-03 NOTE — Telephone Encounter (Signed)
Pt left v/m would like call back with MRI lumbar spine results when available. Pt said limited ability to be up without lower back pain. Pt has pressure feeling at base of spine. Pain level now 7. Pt saw Dr Patsy Lager on 02/29/12. Pt can be reached [phone] until 6pm today or after that  [phone]. Tramadol slightly helping pain. Pt uses [facility] if pharmacy needed.

## 2012-03-04 MED ORDER — PREDNISONE 10 MG PO TABS: ORAL_TABLET | ORAL | Status: DC

## 2012-03-04 MED ORDER — HYDROCODONE-ACETAMINOPHEN 5-500 MG PO TABS: 1.0000 | ORAL_TABLET | Freq: Four times a day (QID) | ORAL | Status: AC | PRN

## 2012-03-04 NOTE — Telephone Encounter (Signed)
Noted, d/w patient.

## 2012-03-05 ENCOUNTER — Telehealth: Payer: Self-pay

## 2012-03-05 NOTE — Telephone Encounter (Signed)
This is reasonable.

## 2012-03-05 NOTE — Telephone Encounter (Signed)
Patient advised.

## 2012-03-05 NOTE — Telephone Encounter (Signed)
Is it OK for pt to wear back support or brace during the day when watching 52 yo and 64 yo grandchildren and when doing housework? Pt thinks if could have support feeling to back it would help her pain. Pt can be reached at [phone].

## 2012-03-06 ENCOUNTER — Telehealth: Payer: Self-pay

## 2012-03-06 NOTE — Telephone Encounter (Signed)
Noted  

## 2012-03-06 NOTE — Telephone Encounter (Signed)
Pt left v/m [facility] diabetic medical will be contacting Dr Patsy Lager for orders for diabetic supplies; pt test blood sugar twice a day.

## 2012-03-19 ENCOUNTER — Other Ambulatory Visit: Payer: Self-pay | Admitting: *Deleted

## 2012-03-19 MED ORDER — METFORMIN HCL 1000 MG PO TABS: ORAL_TABLET | ORAL | Status: DC

## 2012-04-06 ENCOUNTER — Emergency Department (HOSPITAL_COMMUNITY)
Admission: EM | Admit: 2012-04-06 | Discharge: 2012-04-06 | Disposition: A | Discharge status: Home / Self Care | Payer: Medicare Other | Attending: Emergency Medicine | Admitting: Emergency Medicine

## 2012-04-06 ENCOUNTER — Emergency Department (HOSPITAL_COMMUNITY): Payer: Medicare Other

## 2012-04-06 ENCOUNTER — Encounter (HOSPITAL_COMMUNITY): Payer: Self-pay | Admitting: Emergency Medicine

## 2012-04-06 DIAGNOSIS — W108XXA Fall (on) (from) other stairs and steps, initial encounter: Secondary | ICD-10-CM | POA: Insufficient documentation

## 2012-04-06 DIAGNOSIS — M51379 Other intervertebral disc degeneration, lumbosacral region without mention of lumbar back pain or lower extremity pain: Secondary | ICD-10-CM | POA: Insufficient documentation

## 2012-04-06 DIAGNOSIS — IMO0002 Reserved for concepts with insufficient information to code with codable children: Secondary | ICD-10-CM | POA: Insufficient documentation

## 2012-04-06 DIAGNOSIS — M549 Dorsalgia, unspecified: Secondary | ICD-10-CM | POA: Insufficient documentation

## 2012-04-06 DIAGNOSIS — W19XXXA Unspecified fall, initial encounter: Secondary | ICD-10-CM

## 2012-04-06 DIAGNOSIS — G4730 Sleep apnea, unspecified: Secondary | ICD-10-CM | POA: Insufficient documentation

## 2012-04-06 DIAGNOSIS — E785 Hyperlipidemia, unspecified: Secondary | ICD-10-CM | POA: Insufficient documentation

## 2012-04-06 DIAGNOSIS — E119 Type 2 diabetes mellitus without complications: Secondary | ICD-10-CM | POA: Insufficient documentation

## 2012-04-06 DIAGNOSIS — I10 Essential (primary) hypertension: Secondary | ICD-10-CM | POA: Insufficient documentation

## 2012-04-06 DIAGNOSIS — Z79899 Other long term (current) drug therapy: Secondary | ICD-10-CM | POA: Insufficient documentation

## 2012-04-06 DIAGNOSIS — M25559 Pain in unspecified hip: Secondary | ICD-10-CM | POA: Insufficient documentation

## 2012-04-06 DIAGNOSIS — K219 Gastro-esophageal reflux disease without esophagitis: Secondary | ICD-10-CM | POA: Insufficient documentation

## 2012-04-06 DIAGNOSIS — M5137 Other intervertebral disc degeneration, lumbosacral region: Secondary | ICD-10-CM | POA: Insufficient documentation

## 2012-04-06 MED ORDER — PERCOCET 5-325 MG PO TABS: 1.0000 | ORAL_TABLET | Freq: Four times a day (QID) | ORAL | Status: AC | PRN

## 2012-04-06 MED ORDER — IBUPROFEN 200 MG PO TABS
400.0000 mg | ORAL_TABLET | Freq: Once | ORAL | Status: AC
  Administered 2012-04-06: 400 mg via ORAL
  Filled 2012-04-06: qty 2

## 2012-04-06 MED ORDER — OXYCODONE-ACETAMINOPHEN 5-325 MG PO TABS
1.0000 | ORAL_TABLET | Freq: Once | ORAL | Status: AC
  Administered 2012-04-06: 1 via ORAL
  Filled 2012-04-06: qty 1

## 2012-04-06 NOTE — ED Notes (Signed)
Pt wc to car. Pt alert x3 resp easy

## 2012-04-06 NOTE — ED Provider Notes (Signed)
History     CSN: [id number]  Arrival date & time 04/06/12  1524   First MD Initiated Contact with Patient 04/06/12 1656      Chief Complaint  Patient presents with  . Fall  . Back Pain    (Consider location/radiation/quality/duration/timing/severity/associated sxs/prior treatment) HPI Comments: Patient with a history of DDD, type 2 diabetes, OA, and fibromyalgia presents emergency department chief complaint of back pain.  Last MRI performed April 26 results below.  Patient states that this morning she missed stepping and had a mechanical fall with point of impact on her right knee and then later her lower lumbar spine.  Patient complaining of right hip pain, lower lumbar pain, and some new right leg numness. She denies any new tingling of her extremities, saddle paresthesias, inability to flex or extend her toes, loss control of bowel or bladder, IV drug use, cancer, fevers, night sweats, chills.  Patient reports she has not able to and he laid since the fall and that she was not wearing her back brace when the injury occurred.  No other complaints this time.  MR IMPRESSION: Chronic disc degeneration in the lower thoracic and upper lumbar region with disc space narrowing and bulging of the discs but no compressive stenosis.   Mild facet hypertrophy at L2-3 and L3-4 but no compressive stenosis.   Advanced facet arthropathy at L4-5 with 4 mm of anterolisthesis. Bulging of the disc most prominent in the left foraminal region. Multifactorial spinal stenosis at this level that could be symptomatic.  Additionally, there is potential to affect the left L4 nerve root in the narrowed foramen.   Facet degeneration at L5-S1 worse on the left without slippage or stenosis.   Original Report Authenticated By: Thomasenia Sales, M.D.  Patient is a 51 y.o. female presenting with fall and back pain. The history is provided by the patient.  Fall Pertinent negatives include no fever, no numbness, no  abdominal pain, no nausea and no headaches.  Back Pain  Pertinent negatives include no chest pain, no fever, no numbness, no headaches, no abdominal pain and no weakness.    Past Medical History  Diagnosis Date  . Allergic rhinitis, cause unspecified   . Anxiety state, unspecified   . Extrinsic asthma with exacerbation   . Backache, unspecified   . Depressive disorder, not elsewhere classified   . Type II or unspecified type diabetes mellitus without mention of complication, not stated as uncontrolled   . Esophageal reflux   . Other and unspecified hyperlipidemia   . Osteoarthrosis, unspecified whether generalized or localized, unspecified site   . Other screening mammogram   . Routine gynecological examination   . Unspecified sleep apnea   . Tobacco use disorder   . Personal history of unspecified urinary disorder   . Routine general medical examination at a health care facility   . Heart murmur   . Hypertension   . Calculus of kidney   . Fibromyalgia     Past Surgical History  Procedure Date  . Appendectomy 1973  . Cholecystectomy 08/2006  . Transthoracic echocardiogram 02/2011    mild LVH, nl EF, mild diastolic dysfunction, no wall motion abnl    Family History  Problem Relation Age of Onset  . Stroke Father   . Colon cancer Cousin     History  Substance Use Topics  . Smoking status: Current Everyday Smoker -- 1.0 packs/day for 35 years    Types: Cigarettes  . Smokeless tobacco: Never Used  .  Alcohol Use: No    OB History    Grav Para Term Preterm Abortions TAB SAB Ect Mult Living                  Review of Systems  Constitutional: Positive for activity change. Negative for fever, chills, fatigue and unexpected weight change.  HENT: Negative for neck pain and neck stiffness.   Eyes: Negative for visual disturbance.  Respiratory: Negative for shortness of breath.   Cardiovascular: Negative for chest pain and leg swelling.  Gastrointestinal: Negative for  nausea, abdominal pain, constipation and rectal pain.  Genitourinary: Negative for urgency and difficulty urinating.       Patient denies bowel and bladder incontinence.  Musculoskeletal: Positive for back pain and gait problem. Negative for myalgias, joint swelling and arthralgias.  Neurological: Negative for weakness, numbness and headaches.  All other systems reviewed and are negative.    Allergies  Aspirin; Diclofenac sodium; Fluticasone-salmeterol; Nsaids; and Penicillins  Home Medications   Current Outpatient Rx  Name Route Sig Dispense Refill  . ALBUTEROL SULFATE HFA 108 (90 BASE) MCG/ACT IN AERS Inhalation Inhale 2 puffs into the lungs every 4 (four) hours as needed for wheezing. 1 Inhaler 5  . ALPRAZOLAM 0.5 MG PO TABS Oral Take 1 tablet (0.5 mg total) by mouth 3 (three) times daily as needed for sleep or anxiety. 90 tablet 0  . AMITRIPTYLINE HCL 50 MG PO TABS  TAKE 1 TABLET BY MOUTH ONCE EVERY NIGHT 30 tablet 11  . ASPIRIN EC 81 MG PO TBEC Oral Take 1 tablet (81 mg total) by mouth daily.    Marland Kitchen BENAZEPRIL HCL 10 MG PO TABS  TAKE 1 TABLET BY MOUTH EVERY DAY 30 tablet 11  . BUDESONIDE-FORMOTEROL FUMARATE 80-4.5 MCG/ACT IN AERO Inhalation Inhale 2 puffs into the lungs 2 (two) times daily. 1 Inhaler 11  . CALCIUM CARBONATE ANTACID 1000 MG PO CHEW Oral Chew 1,000 mg by mouth as needed. heartburn    . CHLORPHENIRAMINE MALEATE 4 MG PO TABS Oral Take 4 mg by mouth 2 (two) times daily as needed.      . CYCLOBENZAPRINE HCL 10 MG PO TABS  TAKE 1 TABLET BY MOUTH TWICE A DAY AS NEEDED FOR MUSCLE SPASM 60 tablet 0  . OMEGA-3 FATTY ACIDS 1000 MG PO CAPS Oral Take 1 g by mouth daily.      Marland Kitchen GEMFIBROZIL 600 MG PO TABS  TAKE 1 TABLET BY MOUTH TWICE A DAY 60 tablet 11  . GLIPIZIDE 10 MG PO TABS  TAKE 1 TABLET BY MOUTH DAILY 30 tablet 10  . METFORMIN HCL 1000 MG PO TABS  TAKE 1 TABLET BY MOUTH EVERY MORNING AND 1 TABLET EVERY EVENING 60 tablet 4  . NITROGLYCERIN 0.4 MG SL SUBL Sublingual Place 1  tablet (0.4 mg total) under the tongue every 5 (five) minutes as needed for chest pain. May use up to 3 x, if used call us or go to ER. 20 tablet 1  . PIOGLITAZONE HCL 15 MG PO TABS Oral Take 1 tablet (15 mg total) by mouth daily. 30 tablet 6  . PREDNISONE 10 MG PO TABS  4 tabs po x 2 days, then 3 tabs po x 2 days, then 2 tabs po x 2 days, then 1 tab po x 2 days 20 tablet 0    BP 134/62  Pulse 99  Temp(Src) 97.8 F (36.6 C) (Oral)  Resp 20  SpO2 97%  Physical Exam  Nursing note and vitals reviewed.  Constitutional: She is oriented to person, place, and time. She appears well-developed and well-nourished. No distress.  HENT:  Head: Normocephalic and atraumatic.  Eyes: Conjunctivae and EOM are normal. Pupils are equal, round, and reactive to light. No scleral icterus.  Neck: Normal range of motion and full passive range of motion without pain. Neck supple. No spinous process tenderness and no muscular tenderness present. No rigidity. Normal range of motion present. No Brudzinski's sign noted.  Cardiovascular: Normal rate, regular rhythm and intact distal pulses.  Exam reveals no gallop and no friction rub.   No murmur heard. Pulmonary/Chest: Effort normal and breath sounds normal. No respiratory distress. She has no wheezes. She has no rales. She exhibits no tenderness.  Musculoskeletal:       Cervical back: She exhibits normal range of motion, no tenderness, no bony tenderness and no pain.       Thoracic back: She exhibits no tenderness, no bony tenderness and no pain.       Lumbar back: She exhibits tenderness, bony tenderness and pain. She exhibits no spasm and normal pulse.       Right foot: She exhibits no swelling.       Left foot: She exhibits no swelling.       Bilateral lower extremities nontender without color change, baseline range of motion of extremities with intact distal pulses, capillary refill less than 2 seconds bilaterally.  Pt has increased pain w ROM of lumbar spine.  Pain w ambulation, no sign of ataxia.  Neurological: She is alert and oriented to person, place, and time. She has normal strength and normal reflexes. No sensory deficit. Gait (no ataxia, slowed and hunched d/t pain ) abnormal.       No motor loss including dorsiflexion of toes and sensory of lateral calf and dorsal foot intact.  Sensation at baseline for light touch in all 4 distal extremities, motor symmetric & bilateral 5/5  Abduction,adduction,flexion of hips, knee flexion & extension, foot dorsiflexion, foot plantar flexion.  Skin: Skin is warm and dry. No rash noted. She is not diaphoretic. No erythema. No pallor.  Psychiatric: She has a normal mood and affect.    ED Course  Procedures (including critical care time)  Labs Reviewed - No data to display Dg Lumbar Spine Complete  04/06/2012  *RADIOLOGY REPORT*  Clinical Data: Fall, low back pain.  LUMBAR SPINE - COMPLETE 4+ VIEW  Comparison: MRI 03/01/2012  Findings: Advanced degenerative facet disease in the mid and lower lumbar spine, most pronounced at L4-5.  7 mm of anterolisthesis of L4 on L5, increased since prior MRI.  Advanced degenerative disc disease changes in the lower thoracic and upper lumbar spine.  No fracture.  SI joints are symmetric and unremarkable.  IMPRESSION: Advanced degenerative changes as above.  Increasing anterolisthesis of L4 on L5, now 7 mm.  Original Report Authenticated By: Cyndie Chime, M.D.   Dg Hip Complete Left  04/06/2012  *RADIOLOGY REPORT*  Clinical Data: Fall.  Left hip pain.  LEFT HIP - COMPLETE 2+ VIEW  Comparison: Plain films left hip 04/20/2011.  Findings: Imaged bones, joints and soft tissues appear normal.  IMPRESSION: Negative exam.  Original Report Authenticated By: Bernadene Bell. Maricela Curet, M.D.     No diagnosis found.    MDM  Fall, chronic low back pain, right hip pain   Patient with back & hip pain.  No neurological deficits and normal neuro exam.  Patient can walk but states is painful.   Pain treated  in an emergency department.  Short prescription given discussed with patient the necessity to followup with her doctor in a chronic pain is not treated in the emergency department. No loss of bowel or bladder control.  No concern for cauda equina.  No fever, night sweats, weight loss, h/o cancer, IVDU.  RICE protocol and pain medicine indicated and discussed with patient. CT to assure no fracture. Pt advised to follow up w her orthopedic this week for possible instability of L4-L5 space. 3 mm change discussed with Dr. Victorino Dike, orthopedics who agrees with my plan.          Jaci Carrel, [place] 04/06/12 1948

## 2012-04-06 NOTE — ED Notes (Addendum)
Pt presenting to ed with c/o fall this morning at [employer] while walking up the stairs pt states she turned her ankle and fell. Pt states lower back pain. Pt states she has history of back problems. Pt states she was able to ambulate since the fall. Pt states she is feeling numbness to her right leg. Pt states she does wear a back brace. Pt states right ankle pain and right knee pain. Pt is alert and oriented at this time. Pt states she has an abrasion to her right knee

## 2012-04-06 NOTE — Discharge Instructions (Signed)
Follow up with her orthopedic this week in regards to your visit.   Followup with orthopedics if symptoms continue. Use conservative methods at home including heat therapy and cold therapy as we discussed. More information on cold therapy is listed below.  It is not reccommended to use heat treatment directly after an acute injury.  SEEK IMMEDIATE MEDICAL ATTENTION IF: New numbness, tingling, weakness, or problem with the use of your arms or legs.  Severe back pain not relieved with medications.  Change in bowel or bladder control.  Increasing pain in any areas of the body (such as chest or abdominal pain).  Shortness of breath, dizziness or fainting.  Nausea (feeling sick to your stomach), vomiting, fever, or sweats.  COLD THERAPY DIRECTIONS:  Ice or gel packs can be used to reduce both pain and swelling. Ice is the most helpful within the first 24 to 48 hours after an injury or flareup from overusing a muscle or joint.  Ice is effective, has very few side effects, and is safe for most people to use.   If you expose your skin to cold temperatures for too long or without the proper protection, you can damage your skin or nerves. Watch for signs of skin damage due to cold.   HOME CARE INSTRUCTIONS  Follow these tips to use ice and cold packs safely.  Place a dry or damp towel between the ice and skin. A damp towel will cool the skin more quickly, so you may need to shorten the time that the ice is used.  For a more rapid response, add gentle compression to the ice.  Ice for no more than 10 to 20 minutes at a time. The bonier the area you are icing, the less time it will take to get the benefits of ice.  Check your skin after 5 minutes to make sure there are no signs of a poor response to cold or skin damage.  Rest 20 minutes or more in between uses.  Once your skin is numb, you can end your treatment. You can test numbness by very lightly touching your skin. The touch should be so light that  you do not see the skin dimple from the pressure of your fingertip. When using ice, most people will feel these normal sensations in this order: cold, burning, aching, and numbness.  Do not use ice on someone who cannot communicate their responses to pain, such as small children or people with dementia.   HOW TO MAKE AN ICE PACK  To make an ice pack, do one of the following:  Place crushed ice or a bag of frozen vegetables in a sealable plastic bag. Squeeze out the excess air. Place this bag inside another plastic bag. Slide the bag into a pillowcase or place a damp towel between your skin and the bag.  Mix 3 parts water with 1 part rubbing alcohol. Freeze the mixture in a sealable plastic bag. When you remove the mixture from the freezer, it will be slushy. Squeeze out the excess air. Place this bag inside another plastic bag. Slide the bag into a pillowcase or place a damp towel between your skin and the bag.   SEEK MEDICAL CARE IF:  You develop white spots on your skin. This may give the skin a blotchy (mottled) appearance.  Your skin turns blue or pale.  Your skin becomes waxy or hard.  Your swelling gets worse.  MAKE SURE YOU:  Understand these instructions.  Will watch your  condition.  Will get help right away if you are not doing well or get worse.    Chronic Pain Discharge Instructions  Emergency care providers appreciate that many patients coming to [place] are in severe pain and we wish to address their pain in the safest, most responsible manner.  It is important to recognize however, that the proper treatment of chronic pain differs from that of the pain of injuries and acute illnesses.  Our goal is to provide quality, safe, personalized care and we thank you for giving [place] the opportunity to serve you. The use of narcotics and related agents for chronic pain syndromes may lead to additional physical and psychological problems.  Nearly as many people die from prescription narcotics each  year as die from car crashes.  Additionally, this risk is increased if such prescriptions are obtained from a variety of sources.  Therefore, only your primary care physician or a pain management specialist is able to safely treat such syndromes with narcotic medications long-term.    Documentation revealing such prescriptions have been sought from multiple sources may prohibit [place] from providing a refill or different narcotic medication.  Your name may be checked first through the [facility] Controlled Substances Reporting System.  This database is a record of controlled substance medication prescriptions that the patient has received.  This has been established by [facility] in an effort to eliminate the dangerous, and often life threatening, practice of obtaining multiple prescriptions from different medical providers.   If you have a chronic pain syndrome (i.e. chronic headaches, recurrent back or neck pain, dental pain, abdominal or pelvis pain without a specific diagnosis, or neuropathic pain such as fibromyalgia) or recurrent visits for the same condition without an acute diagnosis, you may be treated with non-narcotics and other non-addictive medicines.  Allergic reactions or negative side effects that may be reported by a patient to such medications will not typically lead to the use of a narcotic analgesic or other controlled substance as an alternative.   Patients managing chronic pain with a personal physician should have provisions in place for breakthrough pain.  If you are in crisis, you should call your physician.  If your physician directs you to the emergency department, please have the doctor call and speak to our attending physician concerning your care.   When patients come to the Emergency Department (ED) with acute medical conditions in which the Emergency Department physician feels appropriate to prescribe narcotic or sedating pain medication, the physician will prescribe these  in very limited quantities.  The amount of these medications will last only until you can see your primary care physician in his/her office.  Any patient who returns to the ED seeking refills should expect only non-narcotic pain medications.   In the event of an acute medical condition exists and the emergency physician feels it is necessary that the patient be given a narcotic or sedating medication -  a responsible adult driver should be present in the room prior to the medication being given by the nurse.   Prescriptions for narcotic or sedating medications that have been lost, stolen or expired will not be refilled in the Emergency Department.    Patients who have chronic pain may receive non-narcotic prescriptions until seen by their primary care physician.  It is every patient's personal responsibility to maintain active prescriptions with his or her primary care physician or specialist.

## 2012-04-07 ENCOUNTER — Encounter: Payer: Self-pay | Admitting: Family Medicine

## 2012-04-07 ENCOUNTER — Ambulatory Visit (INDEPENDENT_AMBULATORY_CARE_PROVIDER_SITE_OTHER): Payer: Medicare Other | Admitting: Family Medicine

## 2012-04-07 VITALS — BP 128/74 | HR 92 | Temp 98.0°F | Resp 20 | Ht 60.0 in | Wt 234.8 lb

## 2012-04-07 DIAGNOSIS — M549 Dorsalgia, unspecified: Secondary | ICD-10-CM

## 2012-04-07 NOTE — Patient Instructions (Signed)
REFERRAL: GO THE THE FRONT ROOM AT THE ENTRANCE OF OUR CLINIC, NEAR CHECK IN. ASK FOR Sandra Ferrell. SHE WILL HELP YOU SET UP YOUR REFERRAL. DATE: TIME:  

## 2012-04-07 NOTE — ED Provider Notes (Signed)
Medical screening examination/treatment/procedure(s) were performed by non-physician practitioner and as supervising physician I was immediately available for consultation/collaboration.  Geoffery Lyons, MD 04/07/12 [phone]

## 2012-04-07 NOTE — Progress Notes (Signed)
[facility] at [facility]  Patient Name: Sandra Ferrell Date of Birth: [birth year] Medical Record Number: [id number] Gender: female Date of Encounter: 04/07/2012  History of Present Illness:  Sandra Ferrell is a 51 y.o. very pleasant female patient who presents with the following:  Larey Seat again The patient fell again, after I initially saw her on February 29, 2012. She fell a few days ago and ultimately went to the emergency room had a CT of her spine.  I recently got MR of her spine as well and she has some disc significant diffuse degenerative changes in protrusions with some impaction of the nerves.  Dg Lumbar Spine Complete  04/06/2012  *RADIOLOGY REPORT*  Clinical Data: Fall, low back pain.  LUMBAR SPINE - COMPLETE 4+ VIEW  Comparison: MRI 03/01/2012  Findings: Advanced degenerative facet disease in the mid and lower lumbar spine, most pronounced at L4-5.  7 mm of anterolisthesis of L4 on L5, increased since prior MRI.  Advanced degenerative disc disease changes in the lower thoracic and upper lumbar spine.  No fracture.  SI joints are symmetric and unremarkable.  IMPRESSION: Advanced degenerative changes as above.  Increasing anterolisthesis of L4 on L5, now 7 mm.  Original Report Authenticated By: Cyndie Chime, M.D.   Dg Hip Complete Left  04/06/2012  *RADIOLOGY REPORT*  Clinical Data: Fall.  Left hip pain.  LEFT HIP - COMPLETE 2+ VIEW  Comparison: Plain films left hip 04/20/2011.  Findings: Imaged bones, joints and soft tissues appear normal.  IMPRESSION: Negative exam.  Original Report Authenticated By: Bernadene Bell. Maricela Curet, M.D.   Ct Lumbar Spine Wo Contrast  04/06/2012  *RADIOLOGY REPORT*  Clinical Data: History of fall complaining of back pain.  CT LUMBAR SPINE WITHOUT CONTRAST  Technique:  Multidetector CT imaging of the lumbar spine was performed without intravenous contrast administration. Multiplanar CT image reconstructions were also generated.  Comparison: Lumbar spine  radiographs dated 04/06/2012.  MRI of the lumbar spine 03/01/2012.  Findings: The there are no acute displaced fractures or compression fractures identified.  There is multilevel degenerative disc disease, most severe at T12-L1, L1-L2 and L4-L5.  At L4-L5 there is 6 mm of anterolisthesis of L4.  Multilevel facet arthropathy is also noted, most severe at L4-L5 and L5-S1.  Incidental imaging of the abdomen is remarkable for extensive atherosclerosis in the abdominal aorta.  IMPRESSION: 1.  Negative for acute fracture of the lumbar spine. 2.  Chronic changes of degenerative disc disease and lumbar spondylosis redemonstrated, as above, including 6 mm of anterolisthesis of L4 upon L5.  Original Report Authenticated By: Florencia Reasons, M.D.    Patient Active Problem List  Diagnoses  . DIABETES MELLITUS, TYPE II  . HYPERLIPIDEMIA  . ANXIETY  . TOBACCO ABUSE  . DEPRESSION  . ALLERGIC RHINITIS  . ASTHMA, EXTRINSIC, WITH ACUTE EXACERBATION  . GERD  . RENAL CALCULUS  . OSTEOARTHRITIS  . BACK PAIN, CHRONIC  . FIBROMYALGIA  . SLEEP APNEA  . UTI'S, HX OF  . Essential hypertension, benign  . Acute bronchitis   Past Medical History  Diagnosis Date  . Allergic rhinitis, cause unspecified   . Anxiety state, unspecified   . Extrinsic asthma with exacerbation   . Backache, unspecified   . Depressive disorder, not elsewhere classified   . Type II or unspecified type diabetes mellitus without mention of complication, not stated as uncontrolled   . Esophageal reflux   . Other and unspecified hyperlipidemia   . Osteoarthrosis, unspecified whether  generalized or localized, unspecified site   . Other screening mammogram   . Routine gynecological examination   . Unspecified sleep apnea   . Tobacco use disorder   . Personal history of unspecified urinary disorder   . Routine general medical examination at a health care facility   . Heart murmur   . Hypertension   . Calculus of kidney   .  Fibromyalgia    Past Surgical History  Procedure Date  . Appendectomy 1973  . Cholecystectomy 08/2006  . Transthoracic echocardiogram 02/2011    mild LVH, nl EF, mild diastolic dysfunction, no wall motion abnl   History  Substance Use Topics  . Smoking status: Current Everyday Smoker -- 1.0 packs/day for 35 years    Types: Cigarettes  . Smokeless tobacco: Never Used  . Alcohol Use: No   Family History  Problem Relation Age of Onset  . Stroke Father   . Colon cancer Cousin    Allergies  Allergen Reactions  . Aspirin     REACTION: Burns stomach  . Diclofenac Sodium     REACTION: GI bleed  . Fluticasone-Salmeterol     REACTION: tongue was swollen and hives and itching  . Nsaids     REACTION: rectal bleeding  . Penicillins     REACTION: rash    Medication list has been reviewed and updated.  Prior to Admission medications   Medication Sig Start Date End Date Taking? Authorizing Provider  albuterol (PROAIR HFA) 108 (90 BASE) MCG/ACT inhaler Inhale 2 puffs into the lungs every 4 (four) hours as needed for wheezing. 02/14/12  Yes Amy Michelle Nasuti, MD  ALPRAZolam (XANAX) 0.5 MG tablet Take 1 tablet (0.5 mg total) by mouth 3 (three) times daily as needed for sleep or anxiety. 09/11/11 09/10/12 Yes Amy Michelle Nasuti, MD  amitriptyline (ELAVIL) 50 MG tablet TAKE 1 TABLET BY MOUTH ONCE EVERY NIGHT 10/16/11  Yes Amy Michelle Nasuti, MD  aspirin EC 81 MG EC tablet Take 1 tablet (81 mg total) by mouth daily. 02/01/11  Yes Eustaquio Boyden, MD  benazepril (LOTENSIN) 10 MG tablet TAKE 1 TABLET BY MOUTH EVERY DAY 08/28/11  Yes Amy Michelle Nasuti, MD  budesonide-formoterol (SYMBICORT) 80-4.5 MCG/ACT inhaler Inhale 2 puffs into the lungs 2 (two) times daily. 05/14/11  Yes Amy Michelle Nasuti, MD  calcium elemental as carbonate (ANTACID ULTRA STRENGTH) 400 MG tablet Chew 1,000 mg by mouth as needed. heartburn   Yes Historical Provider, MD  chlorpheniramine (CHLOR-TRIMETON) 4 MG tablet Take 4 mg by mouth 2 (two) times  daily as needed.     Yes Historical Provider, MD  cyclobenzaprine (FLEXERIL) 10 MG tablet TAKE 1 TABLET BY MOUTH TWICE A DAY AS NEEDED FOR MUSCLE SPASM 11/04/11  Yes Amy Michelle Nasuti, MD  fish oil-omega-3 fatty acids 1000 MG capsule Take 1 g by mouth daily.     Yes Historical Provider, MD  gemfibrozil (LOPID) 600 MG tablet TAKE 1 TABLET BY MOUTH TWICE A DAY 11/04/11  Yes Amy E Bedsole, MD  glipiZIDE (GLUCOTROL) 10 MG tablet TAKE 1 TABLET BY MOUTH DAILY 11/04/11  Yes Amy E Bedsole, MD  metFORMIN (GLUCOPHAGE) 1000 MG tablet TAKE 1 TABLET BY MOUTH EVERY MORNING AND 1 TABLET EVERY EVENING 03/19/12  Yes Amy E Bedsole, MD  pioglitazone (ACTOS) 15 MG tablet Take 1 tablet (15 mg total) by mouth daily. 12/14/11  Yes Amy Michelle Nasuti, MD  nitroGLYCERIN (NITROSTAT) 0.4 MG SL tablet Place 1 tablet (0.4 mg total) under the tongue every  5 (five) minutes as needed for chest pain. May use up to 3 x, if used call us or go to ER. 02/01/11   Eustaquio Boyden, MD  PERCOCET 5-325 MG per tablet Take 1 tablet by mouth every 6 (six) hours as needed for pain. 04/06/12 04/16/12  Lisette Paz, PA-C  predniSONE (DELTASONE) 10 MG tablet 4 tabs po x 2 days, then 3 tabs po x 2 days, then 2 tabs po x 2 days, then 1 tab po x 2 days 03/04/12   Hannah Beat, MD    Review of Systems:   GEN: No fevers, chills. Nontoxic. Primarily MSK c/o today. MSK: Detailed in the HPI GI: tolerating PO intake without difficulty Neuro: No numbness, parasthesias, or tingling associated. Otherwise the pertinent positives of the ROS are noted above.    Physical Examination: Filed Vitals:   04/07/12 1348  BP: 128/74  Pulse: 92  Temp: 98 F (36.7 C)  Resp: 20   Filed Vitals:   04/07/12 1348  Height: 5' (1.524 m)  Weight: 234 lb 12 oz (106.482 kg)   Body mass index is 45.85 kg/(m^2).   GEN: WDWN, NAD, Non-toxic, Alert & Oriented x 3 HEENT: Atraumatic, Normocephalic.  Ears and Nose: No external deformity. EXTR: No clubbing/cyanosis/edema NEURO:  Normal gait.  PSYCH: Normally interactive. Conversant. Not depressed or anxious appearing.  Calm demeanor.   MSK: Diffuse posterior tenderness in the paraspinal musculature  L3-S1. Tender along the L3-S1 posteriorly. Nontender at the trochanteric bursa. Mildly tender in the SI joints.  Limitation in motion is fairly significant with flexion and extension. Minimal ability to extend and flex. No numbness, tingling. Neurovasc intact  Assessment and Plan:  1. Back pain  Ambulatory referral to Physical Medicine Rehab   Clinically, face-to-face, the patient's improved compared to my initial evaluation and, and she does have some significant findings on MRI, x-ray, and CT. Some slight worsening of the anterolisthesis. 25 minutes spent in face-to-face time, 50% more spent in discussion of spinal anatomy, and review of the patient's films with her and ultimate plan of care.   The patient's film actually looks worse than the patient does clinically. She is not have to take any narcotics. Prior to her most recent fall, the patient had been improving for a significantly.  I suspected this will probably do okay conservatively as long as her symptoms continue to improve.  I would like to get somebody else's opinion as well. Will consult Dr. Regino Schultze at [facility] for his opinion.  Orders Today: Orders Placed This Encounter  Procedures  . Ambulatory referral to Physical Medicine Rehab    Referral Priority:  Routine    Referral Type:  Rehabilitation    Referral Reason:  Specialty Services Required    Requested Specialty:  Physical Medicine and Rehabilitation    Number of Visits Requested:  1    Medications Today: No orders of the defined types were placed in this encounter.     Hannah Beat, MD 04/07/2012 2:04 PM

## 2012-04-15 ENCOUNTER — Other Ambulatory Visit: Payer: Self-pay

## 2012-04-15 NOTE — Telephone Encounter (Signed)
Pt saw Dr Regino Schultze today for spinal stenosis and f/u again on 05/05/12; pt request Dr Patsy Lager to refill Hydrocodone-APAP 5-500 mg. To [facility]. Not on med list.Please advise.

## 2012-04-16 ENCOUNTER — Telehealth: Payer: Self-pay | Admitting: Family Medicine

## 2012-04-16 NOTE — Telephone Encounter (Signed)
Caller: Lachanda/Patient; PCP: Hannah Beat T.; CB#: [phone]; ; ; Call regarding FSBS 300, Headache w/ shaking; Pt had Lumbar Steroid Shot on 6-11;  Ask Pt to reck FSBS = 339 at 1619. Diabetes Control Protocol used.  Consulted w/ Dr Patsy Lager, MD advised Pt to increase fluids, discussed less Carbs and walking.  Per MD, he is awaiting on Dr Juanda Chance input before prescribing pain meds.  Pt verbalized understanding.

## 2012-04-16 NOTE — Telephone Encounter (Signed)
Patient calling to check on her pain medication.  Advised per Dr. Brayton Layman note that he is waiting on Dr. Danae Orleans input.

## 2012-04-16 NOTE — Telephone Encounter (Signed)
I am going to get Dr. Senaida Lange input before any action -- I sent her to see Claria Dice, MD, at [facility] PM[facility] - for recommendations. I would get his input before altering chronic pain regimen

## 2012-04-16 NOTE — Telephone Encounter (Signed)
To AEB. 

## 2012-04-17 NOTE — Telephone Encounter (Signed)
Patient is getting pain meds from dr Ashok Pall office

## 2012-04-17 NOTE — Telephone Encounter (Signed)
Noted. Dr. Regino Schultze to prescribe narcotics since seeing in ongoing treatment. Heather... Contact their office to make sure they are addressing pain medicaion.

## 2012-04-25 ENCOUNTER — Other Ambulatory Visit: Payer: Self-pay | Admitting: *Deleted

## 2012-04-25 MED ORDER — PIOGLITAZONE HCL 15 MG PO TABS: 15.0000 mg | ORAL_TABLET | Freq: Every day | ORAL | Status: DC

## 2012-04-25 NOTE — Telephone Encounter (Signed)
This has been taken care of.

## 2012-04-25 NOTE — Telephone Encounter (Signed)
Herbert Seta can this encounter be closed?

## 2012-05-02 ENCOUNTER — Telehealth: Payer: Self-pay | Admitting: Family Medicine

## 2012-05-02 DIAGNOSIS — E785 Hyperlipidemia, unspecified: Secondary | ICD-10-CM

## 2012-05-02 DIAGNOSIS — I10 Essential (primary) hypertension: Secondary | ICD-10-CM

## 2012-05-02 DIAGNOSIS — E119 Type 2 diabetes mellitus without complications: Secondary | ICD-10-CM

## 2012-05-02 NOTE — Telephone Encounter (Signed)
Message copied by Excell Seltzer on Fri May 02, 2012  2:04 PM ------      Message from: Hannah Beat      Created: Fri May 02, 2012 12:40 PM      Regarding: RE: Cpx labs Tues 7/2       Will forward to long term PCP Dr. Ermalene Searing            I somehow was changed to PCP in the computer? Not the best idea in this case, Dr. B has had her doing well for a long time.                  ----- Message -----         From: Cyd Silence         Sent: 05/02/2012  10:35 AM           To: Hannah Beat, MD      Subject: Cpx labs Tues 7/2                                        Please order  future cpx labs for pt's upcomming lab appt.      Thanks      Rodney Booze

## 2012-05-06 ENCOUNTER — Other Ambulatory Visit (INDEPENDENT_AMBULATORY_CARE_PROVIDER_SITE_OTHER): Payer: Medicare Other

## 2012-05-06 DIAGNOSIS — E119 Type 2 diabetes mellitus without complications: Secondary | ICD-10-CM

## 2012-05-06 DIAGNOSIS — E785 Hyperlipidemia, unspecified: Secondary | ICD-10-CM

## 2012-05-06 DIAGNOSIS — I10 Essential (primary) hypertension: Secondary | ICD-10-CM

## 2012-05-06 LAB — COMPREHENSIVE METABOLIC PANEL
ALT: 18 U/L (ref 0–35)
AST: 24 U/L (ref 0–37)
Albumin: 3.3 g/dL — ABNORMAL LOW (ref 3.5–5.2)
Calcium: 9.3 mg/dL (ref 8.4–10.5)
Chloride: 101 mEq/L (ref 96–112)
Potassium: 4.3 mEq/L (ref 3.5–5.1)

## 2012-05-07 ENCOUNTER — Telehealth: Payer: Self-pay | Admitting: Family Medicine

## 2012-05-07 NOTE — Telephone Encounter (Signed)
Patient advised.

## 2012-05-07 NOTE — Telephone Encounter (Signed)
Pt reports she missed call related to her labs on 05/06/12.  Delivered message from Dr. Ermalene Searing:  05/06/2012 at 2:34 PM Reviewed labs. No critical labs need to be addressed urgently. We will discuss labs in detail at upcoming office visit.   Patient relates she recently had Epidural Steroid Injection if that might have made her blood sugars higher.

## 2012-05-07 NOTE — Telephone Encounter (Signed)
Yes steroid injection may have made DM worse. Will discuss at upcoming OV.

## 2012-05-13 ENCOUNTER — Ambulatory Visit (INDEPENDENT_AMBULATORY_CARE_PROVIDER_SITE_OTHER): Payer: Medicare Other | Admitting: Family Medicine

## 2012-05-13 ENCOUNTER — Encounter: Payer: Self-pay | Admitting: Family Medicine

## 2012-05-13 VITALS — BP 120/74 | HR 95 | Temp 97.9°F | Ht 60.0 in | Wt 235.5 lb

## 2012-05-13 DIAGNOSIS — E119 Type 2 diabetes mellitus without complications: Secondary | ICD-10-CM

## 2012-05-13 DIAGNOSIS — I10 Essential (primary) hypertension: Secondary | ICD-10-CM

## 2012-05-13 DIAGNOSIS — E785 Hyperlipidemia, unspecified: Secondary | ICD-10-CM

## 2012-05-13 DIAGNOSIS — M549 Dorsalgia, unspecified: Secondary | ICD-10-CM

## 2012-05-13 MED ORDER — CYCLOBENZAPRINE HCL 10 MG PO TABS: 5.0000 mg | ORAL_TABLET | Freq: Two times a day (BID) | ORAL | Status: DC | PRN

## 2012-05-13 MED ORDER — ALPRAZOLAM 0.5 MG PO TABS: 0.5000 mg | ORAL_TABLET | Freq: Three times a day (TID) | ORAL | Status: DC | PRN

## 2012-05-13 MED ORDER — IBUPROFEN 800 MG PO TABS: 800.0000 mg | ORAL_TABLET | Freq: Three times a day (TID) | ORAL | Status: DC | PRN

## 2012-05-13 NOTE — Progress Notes (Signed)
Subjective:    Patient ID: Sandra Ferrell, female    DOB: 26-Jan-1961, 51 y.o.   MRN: [id number]  HPI  She had temporarily been seeing a different MD... Does not like. Wisheds to return here.  Due for re-check labs.  Diabetes: Due for re-eval. On actos,glucotrol, glucophage Using medications without difficulties: Hypoglycemic episodes:None Hyperglycemic episodes:yes Feet problems:None Blood Sugars averaging: 156 eye exam within last year: Had course of prednsione in 02/2012. Injections increased CBGs to 400.  Elevated Cholesterol:Due for re-eval On gemfibrozil, fish oil. Using medications without problems:None Muscle aches: None Diet compliance: Moderate Exercise:None Other complaints:   Wt Readings from Last 3 Encounters:  05/13/12 235 lb 8 oz (106.822 kg)  04/07/12 234 lb 12 oz (106.482 kg)  02/29/12 236 lb (107.049 kg)  Has gained some weight since prior to April.   Hypertension:  Well controlled at goal ,130/80 On lotensin.  Using medication without problems or lightheadedness:  Chest pain with exertion:None Edema:None Short of breath:None Average home BPs:At goal. Other issues:   Acute on chronic back pain following 2 falls in April and again in June. Seen in ER following second X-ray. Saw Dr. Patsy Lager. Plain films, MRI performed. Some significant degenerative changes seen but pt was improving. Referred to Dr. Regino Schultze PM and R.  She has seen him several time... Set up for spinal injections... She has noted some improvement. She has had to continue to pick up granddaughter.. This has caused some increased pain. Husband has helped some with heavy lifting. Has further injections pending. No immediate surgical indications. Considering PT as well. Using hydrocodone  Ibuprofen 1-2 a day, taking tramadol 1 tab 4 times a day. 1 cyclobenzaprine at bedtime. Using hydrocodone for breakthrough back pain 1-2 times every other day.   3 days ago in Am after picking up coffee pot.  Felt heaviness in arms and across chest. Lasted 30 mins, improved with moving arms around. No SOB, no sweating.  She has been sleeping on arms at night... Has noted left arm swollen in AM   Review of Systems  Constitutional: Negative for fever and fatigue.  HENT: Negative for ear pain.   Eyes: Negative for pain.  Respiratory: Negative for chest tightness and shortness of breath.   Cardiovascular: Negative for chest pain, palpitations and leg swelling.  Gastrointestinal: Negative for abdominal pain.  Genitourinary: Negative for dysuria.       Objective:   Physical Exam  Constitutional: Vital signs are normal. She appears well-developed and well-nourished. She is cooperative.  Non-toxic appearance. She does not appear ill. No distress.       Morbidly obese  HENT:  Head: Normocephalic.  Right Ear: Hearing, tympanic membrane, external ear and ear canal normal. Tympanic membrane is not erythematous, not retracted and not bulging.  Left Ear: Hearing, tympanic membrane, external ear and ear canal normal. Tympanic membrane is not erythematous, not retracted and not bulging.  Nose: No mucosal edema or rhinorrhea. Right sinus exhibits no maxillary sinus tenderness and no frontal sinus tenderness. Left sinus exhibits no maxillary sinus tenderness and no frontal sinus tenderness.  Mouth/Throat: Uvula is midline, oropharynx is clear and moist and mucous membranes are normal.  Eyes: Conjunctivae, EOM and lids are normal. Pupils are equal, round, and reactive to light. No foreign bodies found.  Neck: Trachea normal and normal range of motion. Neck supple. Carotid bruit is not present. No mass and no thyromegaly present.  Cardiovascular: Normal rate, regular rhythm, S1 normal, S2 normal, normal heart sounds, intact  distal pulses and normal pulses.  Exam reveals no gallop and no friction rub.   No murmur heard. Pulmonary/Chest: Effort normal and breath sounds normal. Not tachypneic. No respiratory  distress. She has no decreased breath sounds. She has no wheezes. She has no rhonchi. She has no rales.  Abdominal: Soft. Normal appearance and bowel sounds are normal. There is no tenderness.  Neurological: She is alert.  Skin: Skin is warm, dry and intact. No rash noted.  Psychiatric: Her speech is normal and behavior is normal. Judgment and thought content normal. Her mood appears not anxious. Cognition and memory are normal. She does not exhibit a depressed mood.    Diabetic foot exam: Normal inspection No skin breakdown Mild B  calluses  Normal DP pulses Normal sensation to light touch and monofilament Nails thickened       Assessment & Plan:

## 2012-05-13 NOTE — Assessment & Plan Note (Signed)
Due for re-eval. 

## 2012-05-13 NOTE — Assessment & Plan Note (Signed)
Continue with plan per Dr. Regino Schultze...possible repeat injections, PT etc. Limit hydrocodone to breakthrough. Exercise as tolerated.

## 2012-05-13 NOTE — Patient Instructions (Addendum)
Return for fasting labs. Work on increasing protein and decreasing carbs in diet.  Exercise as tolerated.

## 2012-05-13 NOTE — Assessment & Plan Note (Signed)
Well controlled. Continue current medication.  

## 2012-05-16 ENCOUNTER — Other Ambulatory Visit: Payer: Medicare Other

## 2012-05-26 ENCOUNTER — Ambulatory Visit (INDEPENDENT_AMBULATORY_CARE_PROVIDER_SITE_OTHER): Payer: Medicare Other | Admitting: Family Medicine

## 2012-05-26 ENCOUNTER — Encounter: Payer: Self-pay | Admitting: Family Medicine

## 2012-05-26 VITALS — BP 102/64 | HR 92 | Temp 97.6°F | Wt 237.8 lb

## 2012-05-26 DIAGNOSIS — N390 Urinary tract infection, site not specified: Secondary | ICD-10-CM

## 2012-05-26 DIAGNOSIS — R300 Dysuria: Secondary | ICD-10-CM

## 2012-05-26 LAB — POCT URINALYSIS DIPSTICK
Bilirubin, UA: NEGATIVE
Nitrite, UA: POSITIVE
pH, UA: 6

## 2012-05-26 MED ORDER — ONDANSETRON HCL 4 MG PO TABS: 4.0000 mg | ORAL_TABLET | Freq: Three times a day (TID) | ORAL | Status: AC | PRN

## 2012-05-26 MED ORDER — SULFAMETHOXAZOLE-TRIMETHOPRIM 800-160 MG PO TABS: 1.0000 | ORAL_TABLET | Freq: Two times a day (BID) | ORAL | Status: AC

## 2012-05-26 NOTE — Assessment & Plan Note (Signed)
Nontoxic, septra, ucx f/u prn.  zofran prn for nausea.  Not c/w pyelo.

## 2012-05-26 NOTE — Progress Notes (Signed)
dysuria:yes duration of symptoms: ~5 days abdominal pain:yes fevers:no back pain:no Vomiting:no but some nausea  Meds, vitals, and allergies reviewed.   ROS: See HPI.  Otherwise negative.    GEN: nad, alert and oriented HEENT: mucous membranes moist NECK: supple CV: rrr.  PULM: ctab, no inc wob ABD: soft, +bs, suprapubic area mildly tender EXT: no edema SKIN: no acute rash BACK: no CVA pain

## 2012-05-26 NOTE — Patient Instructions (Addendum)
Drink plenty of water and start the antibiotics today.  We'll contact you with your lab report.  Take care.   Take zofran (odansantron) for nausea.

## 2012-05-29 LAB — URINE CULTURE

## 2012-06-11 ENCOUNTER — Other Ambulatory Visit: Payer: Self-pay | Admitting: Family Medicine

## 2012-06-16 ENCOUNTER — Telehealth: Payer: Self-pay | Admitting: Family Medicine

## 2012-06-16 NOTE — Telephone Encounter (Signed)
Caller: Ceci/Patient; Phone: [phone]; Reason for Call: Pt is calling to say that there is a problem with her Flexaril.  Pt states at her last office visit she advised Dr.  Ermalene Searing that she had to get up at 3am and take another Flexaril.  That has her taking 10mg  QHS and another one @ 3am. Her current prescription if for 1/2 tablet BID. She has run out because of this. And insurance will not authorize. Pt states her back pain is severe but this regime keeps it tolerable. RN sent office message for request by pt to have the script increase to 1 tablet (10mg  ) BID. [facility]

## 2012-06-16 NOTE — Telephone Encounter (Signed)
Will send to Dr. B, that seems reasonable

## 2012-06-17 ENCOUNTER — Telehealth: Payer: Self-pay | Admitting: Family Medicine

## 2012-06-17 MED ORDER — CYCLOBENZAPRINE HCL 10 MG PO TABS: 10.0000 mg | ORAL_TABLET | Freq: Two times a day (BID) | ORAL | Status: DC

## 2012-06-17 NOTE — Telephone Encounter (Signed)
Caller: Monta/Patient; Patient Name: Sandra Ferrell; PCP: Excell Seltzer.; Best Callback Phone Number: [phone]     Back pain worse, getting cortisone shots in back at Orthopedics, last shot today 8/13.  When she has back pain, legs hurt more.   Has pain in  both thighs, stiff Rt leg.   Usually take Cyclobenzaprine 10 mg  one tablet  at HS for the last 3 years.  Has taken extra pill during the night 4-5 times per week.  Now physician has decreased med to 1/2 pill  up to 2 doses per day.  Upset since went to pharmacy to pick up medicine and was told she can't have the medication until 8/23.   Triaged in Back Symptoms Guideline - Disposition:  See Provider Within 72 Hours due to history of back pain, not responding to usual medicines.   PLEASE REVIEW, CLARIFY MEDICATION DOSAGE, CONTACT PATIENT AT [phone],   ALSO PHARMACY AS NEEDED.

## 2012-06-17 NOTE — Telephone Encounter (Signed)
Spoke with pharmacy and they have medication  Ready for patient to pick up.  I tried to reach patient and couldn't contact her.

## 2012-06-17 NOTE — Telephone Encounter (Signed)
Okay to change prescription and refill as pt requested for 1 month, 1 refill.

## 2012-06-17 NOTE — Telephone Encounter (Signed)
done

## 2012-06-27 ENCOUNTER — Other Ambulatory Visit: Payer: Self-pay | Admitting: *Deleted

## 2012-06-27 MED ORDER — BUDESONIDE-FORMOTEROL FUMARATE 80-4.5 MCG/ACT IN AERO: 2.0000 | INHALATION_SPRAY | Freq: Two times a day (BID) | RESPIRATORY_TRACT | Status: DC

## 2012-07-09 ENCOUNTER — Other Ambulatory Visit: Payer: Self-pay | Admitting: Family Medicine

## 2012-07-10 ENCOUNTER — Observation Stay (HOSPITAL_COMMUNITY)
Admission: EM | Admit: 2012-07-10 | Discharge: 2012-07-10 | Disposition: A | Discharge status: Home / Self Care | Payer: Medicare Other | Attending: Emergency Medicine | Admitting: Emergency Medicine

## 2012-07-10 ENCOUNTER — Other Ambulatory Visit: Payer: Self-pay | Admitting: Family Medicine

## 2012-07-10 ENCOUNTER — Encounter (HOSPITAL_COMMUNITY): Payer: Self-pay | Admitting: *Deleted

## 2012-07-10 DIAGNOSIS — Y92009 Unspecified place in unspecified non-institutional (private) residence as the place of occurrence of the external cause: Secondary | ICD-10-CM | POA: Insufficient documentation

## 2012-07-10 DIAGNOSIS — T783XXA Angioneurotic edema, initial encounter: Principal | ICD-10-CM | POA: Insufficient documentation

## 2012-07-10 DIAGNOSIS — T46905A Adverse effect of unspecified agents primarily affecting the cardiovascular system, initial encounter: Secondary | ICD-10-CM | POA: Insufficient documentation

## 2012-07-10 DIAGNOSIS — K219 Gastro-esophageal reflux disease without esophagitis: Secondary | ICD-10-CM | POA: Insufficient documentation

## 2012-07-10 DIAGNOSIS — IMO0001 Reserved for inherently not codable concepts without codable children: Secondary | ICD-10-CM | POA: Insufficient documentation

## 2012-07-10 DIAGNOSIS — Z79899 Other long term (current) drug therapy: Secondary | ICD-10-CM | POA: Insufficient documentation

## 2012-07-10 DIAGNOSIS — M1990 Unspecified osteoarthritis, unspecified site: Secondary | ICD-10-CM | POA: Insufficient documentation

## 2012-07-10 DIAGNOSIS — E119 Type 2 diabetes mellitus without complications: Secondary | ICD-10-CM | POA: Insufficient documentation

## 2012-07-10 DIAGNOSIS — Z888 Allergy status to other drugs, medicaments and biological substances status: Secondary | ICD-10-CM

## 2012-07-10 DIAGNOSIS — F17200 Nicotine dependence, unspecified, uncomplicated: Secondary | ICD-10-CM | POA: Insufficient documentation

## 2012-07-10 DIAGNOSIS — I10 Essential (primary) hypertension: Secondary | ICD-10-CM | POA: Insufficient documentation

## 2012-07-10 MED ORDER — METHYLPREDNISOLONE SODIUM SUCC 125 MG IJ SOLR
125.0000 mg | Freq: Once | INTRAMUSCULAR | Status: AC
  Administered 2012-07-10: 125 mg via INTRAVENOUS
  Filled 2012-07-10: qty 2

## 2012-07-10 MED ORDER — FAMOTIDINE IN NACL 20-0.9 MG/50ML-% IV SOLN
20.0000 mg | Freq: Once | INTRAVENOUS | Status: AC
  Administered 2012-07-10: 20 mg via INTRAVENOUS
  Filled 2012-07-10: qty 50

## 2012-07-10 MED ORDER — DIPHENHYDRAMINE HCL 50 MG/ML IJ SOLN
INTRAMUSCULAR | Status: AC
  Filled 2012-07-10: qty 1

## 2012-07-10 NOTE — ED Notes (Signed)
Family at bedside. 

## 2012-07-10 NOTE — Progress Notes (Signed)
Observation review is complete. 

## 2012-07-10 NOTE — ED Notes (Signed)
Per EMS pt took symbicort about 2200 yesterday and woke up this am with swelling to tongue and throat. EMS reports that pt stated she had a similar reaction in the past to Advair. Pt arrived with 22g IV to Lt forearm and received 50mg  Benadryl IV PTA.

## 2012-07-10 NOTE — ED Provider Notes (Signed)
History     CSN: [id number]  Arrival date & time 07/10/12  0440   First MD Initiated Contact with Patient 07/10/12 [phone]      Chief Complaint  Patient presents with  . Angioedema    (Consider location/radiation/quality/duration/timing/severity/associated sxs/prior treatment) HPI HX per PT, started last night around 10pm, L tongue swelling now involves entire tongue, feels some swelling in her throat. No itching, on benazepril for about 2 years, no SOB, h/o same attributed to advair use. No rash. No CP. MOd in severity. Benadryl PTA with EMS.  Past Medical History  Diagnosis Date  . Allergic rhinitis, cause unspecified   . Anxiety state, unspecified   . Extrinsic asthma with exacerbation   . Backache, unspecified   . Depressive disorder, not elsewhere classified   . Type II or unspecified type diabetes mellitus without mention of complication, not stated as uncontrolled   . Esophageal reflux   . Other and unspecified hyperlipidemia   . Osteoarthrosis, unspecified whether generalized or localized, unspecified site   . Other screening mammogram   . Routine gynecological examination   . Unspecified sleep apnea   . Tobacco use disorder   . Personal history of unspecified urinary disorder   . Routine general medical examination at a health care facility   . Heart murmur   . Hypertension   . Calculus of kidney   . Fibromyalgia     Past Surgical History  Procedure Date  . Appendectomy 1973  . Cholecystectomy 08/2006  . Transthoracic echocardiogram 02/2011    mild LVH, nl EF, mild diastolic dysfunction, no wall motion abnl    Family History  Problem Relation Age of Onset  . Stroke Father   . Colon cancer Cousin     History  Substance Use Topics  . Smoking status: Current Everyday Smoker -- 1.0 packs/day for 35 years    Types: Cigarettes  . Smokeless tobacco: Never Used  . Alcohol Use: No    OB History    Grav Para Term Preterm Abortions TAB SAB Ect Mult Living               Review of Systems  Constitutional: Negative for fever and chills.  HENT: Negative for sore throat, drooling, trouble swallowing, neck pain, neck stiffness and voice change.   Eyes: Negative for pain.  Respiratory: Negative for shortness of breath.   Cardiovascular: Negative for chest pain.  Gastrointestinal: Negative for nausea, vomiting and abdominal pain.  Genitourinary: Negative for dysuria.  Musculoskeletal: Negative for back pain.  Skin: Negative for rash.  Neurological: Negative for headaches.  All other systems reviewed and are negative.    Allergies  Aspirin; Diclofenac sodium; Fluticasone-salmeterol; Nsaids; and Penicillins  Home Medications   Current Outpatient Rx  Name Route Sig Dispense Refill  . ALBUTEROL SULFATE HFA 108 (90 BASE) MCG/ACT IN AERS Inhalation Inhale 2 puffs into the lungs every 4 (four) hours as needed for wheezing. 1 Inhaler 5  . ALPRAZOLAM 0.5 MG PO TABS Oral Take 1 tablet (0.5 mg total) by mouth 3 (three) times daily as needed for sleep or anxiety. 90 tablet 0  . AMITRIPTYLINE HCL 50 MG PO TABS  TAKE 1 TABLET BY MOUTH ONCE EVERY NIGHT 30 tablet 11  . ASPIRIN EC 81 MG PO TBEC Oral Take 1 tablet (81 mg total) by mouth daily.    Marland Kitchen BENAZEPRIL HCL 10 MG PO TABS  TAKE 1 TABLET BY MOUTH EVERY DAY 30 tablet 11  . BUDESONIDE-FORMOTEROL FUMARATE 80-4.5 MCG/ACT  IN AERO Inhalation Inhale 2 puffs into the lungs 2 (two) times daily. 1 Inhaler 11  . CALCIUM CARBONATE ANTACID 1000 MG PO CHEW Oral Chew 1,000 mg by mouth as needed. heartburn    . CHLORPHENIRAMINE MALEATE 4 MG PO TABS Oral Take 4 mg by mouth 2 (two) times daily as needed.      . CYCLOBENZAPRINE HCL 10 MG PO TABS Oral Take 1 tablet (10 mg total) by mouth 2 (two) times daily. 60 tablet 1  . OMEGA-3 FATTY ACIDS 1000 MG PO CAPS Oral Take 1 g by mouth daily.      Marland Kitchen GEMFIBROZIL 600 MG PO TABS  TAKE 1 TABLET BY MOUTH TWICE A DAY 60 tablet 11  . GLIPIZIDE 10 MG PO TABS  TAKE 1 TABLET BY MOUTH  DAILY 30 tablet 10  . HYDROCODONE-ACETAMINOPHEN 5-325 MG PO TABS Oral Take 1 tablet by mouth as needed.    . IBUPROFEN 800 MG PO TABS  TAKE 1 TABLET (800 MG TOTAL) BY MOUTH EVERY 8 (EIGHT) HOURS AS NEEDED FOR PAIN. 60 tablet 0  . METFORMIN HCL 1000 MG PO TABS  TAKE 1 TABLET BY MOUTH EVERY MORNING AND 1 TABLET EVERY EVENING 60 tablet 4  . NITROGLYCERIN 0.4 MG SL SUBL Sublingual Place 1 tablet (0.4 mg total) under the tongue every 5 (five) minutes as needed for chest pain. May use up to 3 x, if used call us or go to ER. 20 tablet 1  . PIOGLITAZONE HCL 15 MG PO TABS Oral Take 1 tablet (15 mg total) by mouth daily. 30 tablet 6  . TRAMADOL HCL 50 MG PO TABS        BP 115/65  Pulse 96  Temp 98.7 F (37.1 C) (Oral)  Resp 15  SpO2 94%  Physical Exam  Constitutional: She is oriented to person, place, and time. She appears well-developed and well-nourished.  HENT:  Head: Normocephalic and atraumatic.       Angioedema present, no stridor, uvula midline  Eyes: Conjunctivae and EOM are normal. Pupils are equal, round, and reactive to light.  Neck: Trachea normal. Neck supple. No thyromegaly present.  Cardiovascular: Normal rate, regular rhythm, S1 normal, S2 normal and normal pulses.     No systolic murmur is present   No diastolic murmur is present  Pulses:      Radial pulses are 2+ on the right side, and 2+ on the left side.  Pulmonary/Chest: Effort normal and breath sounds normal. No stridor. She has no wheezes. She has no rhonchi.  Abdominal: Soft. Normal appearance and bowel sounds are normal. There is no tenderness. There is no CVA tenderness and negative Murphy's sign.  Musculoskeletal:       BLE:s Calves nontender, no cords or erythema, negative Homans sign  Neurological: She is alert and oriented to person, place, and time. She has normal strength. No cranial nerve deficit or sensory deficit. GCS eye subscore is 4. GCS verbal subscore is 5. GCS motor subscore is 6.  Skin: Skin is warm  and dry. No rash noted. She is not diaphoretic.  Psychiatric: Her speech is normal.       Cooperative and appropriate    ED Course  Procedures (including critical care time)  Angioedema is taking ACE-I will plan to stop this medication.   Placed in CDU Observation protocol for angioedema with plan d/c home when improving or admit for any worsening condition.    MDM   Nursing notes, VS reviewed. Serial evaluations. Steroids  and pepcid provided. Benadryl PTA        Sunnie Nielsen, MD 07/11/12 [phone]

## 2012-07-10 NOTE — ED Notes (Signed)
States woke up with swelling to throat and tongue. States has had prior episode. States she feels better. Denies pain at this time.

## 2012-07-10 NOTE — ED Provider Notes (Signed)
Pt with angioedema 2/2 ACEi which she has been using for 2 years.  Plan reassess and d/c once pt feels better.  Pt to d/c ACEi, and f/u with PCP for reevaluation.    9:33 AM Pt sts she feels better.  Denies throat swelling, trouble breathing, tongue or lip swelling.  Able to eat breakfast without difficulty.  On exam, this is an obese pt, oral exam unremarkable, no significant lip or tongue edema.  Lung CTAB.  Pt acknowledge that she will stop taking her benazepril and will f/u with her PCP Dr. Dionicio Stall.  Pt stable to be d/c.   Fayrene Helper, PA-C 07/10/12 [phone]

## 2012-07-10 NOTE — ED Provider Notes (Signed)
I was available throughout the CDU portion of this patient's care  Gerhard Munch, MD 07/10/12 [phone]

## 2012-07-11 ENCOUNTER — Ambulatory Visit (INDEPENDENT_AMBULATORY_CARE_PROVIDER_SITE_OTHER): Payer: Medicare Other | Admitting: Family Medicine

## 2012-07-11 ENCOUNTER — Encounter: Payer: Self-pay | Admitting: Family Medicine

## 2012-07-11 VITALS — BP 124/76 | HR 100 | Resp 18 | Wt 232.2 lb

## 2012-07-11 DIAGNOSIS — N390 Urinary tract infection, site not specified: Secondary | ICD-10-CM

## 2012-07-11 DIAGNOSIS — I10 Essential (primary) hypertension: Secondary | ICD-10-CM

## 2012-07-11 DIAGNOSIS — T783XXA Angioneurotic edema, initial encounter: Secondary | ICD-10-CM

## 2012-07-11 LAB — POCT URINALYSIS DIPSTICK

## 2012-07-11 MED ORDER — SULFAMETHOXAZOLE-TMP DS 800-160 MG PO TABS: 1.0000 | ORAL_TABLET | Freq: Two times a day (BID) | ORAL | Status: AC

## 2012-07-11 NOTE — Assessment & Plan Note (Signed)
Improved with steroids and off ACEI.

## 2012-07-11 NOTE — Assessment & Plan Note (Signed)
Will treat with sulfa drug ( last culture ecoli sensitive to sulfa) but since recent infection...send for culture.

## 2012-07-11 NOTE — Assessment & Plan Note (Signed)
Stable off BP med. Will follow off med at home. Keep scheduled follow up appt here.

## 2012-07-11 NOTE — Progress Notes (Signed)
  Subjective:    Patient ID: Sandra Ferrell, female    DOB: Feb 17, 1961, 51 y.o.   MRN: [id number]  HPI 51 year old female presents for ER follow up (9/5) following angioedema associated with the ACEI she has been on for several years. Given steroid injection.  She reports improvement in swelling in throat and tounge in last 24 hours. She does have still some mild swelling under tounge. She is using benadryl. No other known exposures oither than she has noted mold in her house. She was in a closet with mold on ceiling the day before.  BPs at home doing well off the medicaiton.    She is also concerned today she may have UTI. She has been having 24 hours of low abdominal soreness, urgency, dysuria, no blood in urine, one episode of incontinence yesterday. Also increased frequency. No fever. Some nausea, no V/D. Did well in past with sulfa med, last infection in 7/22, resolved completely in 3 days.   Review of Systems  Constitutional: Negative for fever and fatigue.  HENT: Negative for ear pain.   Eyes: Negative for pain.  Respiratory: Negative for chest tightness and shortness of breath.   Cardiovascular: Negative for chest pain, palpitations and leg swelling.  Gastrointestinal: Negative for abdominal pain.  Genitourinary: Negative for dysuria.       Objective:   Physical Exam  Constitutional: Vital signs are normal. She appears well-developed and well-nourished. She is cooperative.  Non-toxic appearance. She does not appear ill. No distress.  HENT:  Head: Normocephalic.  Right Ear: Hearing, tympanic membrane, external ear and ear canal normal. Tympanic membrane is not erythematous, not retracted and not bulging.  Left Ear: Hearing, tympanic membrane, external ear and ear canal normal. Tympanic membrane is not erythematous, not retracted and not bulging.  Nose: No mucosal edema or rhinorrhea. Right sinus exhibits no maxillary sinus tenderness and no frontal sinus tenderness. Left  sinus exhibits no maxillary sinus tenderness and no frontal sinus tenderness.  Mouth/Throat: Uvula is midline, oropharynx is clear and moist and mucous membranes are normal.  Eyes: Conjunctivae, EOM and lids are normal. Pupils are equal, round, and reactive to light. No foreign bodies found.  Neck: Trachea normal and normal range of motion. Neck supple. Carotid bruit is not present. No mass and no thyromegaly present.  Cardiovascular: Normal rate, regular rhythm, S1 normal, S2 normal, normal heart sounds, intact distal pulses and normal pulses.  Exam reveals no gallop and no friction rub.   No murmur heard. Pulmonary/Chest: Effort normal and breath sounds normal. Not tachypneic. No respiratory distress. She has no decreased breath sounds. She has no wheezes. She has no rhonchi. She has no rales.  Abdominal: Soft. Normal appearance and bowel sounds are normal. There is tenderness in the suprapubic area.  Neurological: She is alert.  Skin: Skin is warm, dry and intact. No rash noted.  Psychiatric: Her speech is normal and behavior is normal. Judgment and thought content normal. Her mood appears not anxious. Cognition and memory are normal. She does not exhibit a depressed mood.          Assessment & Plan:

## 2012-07-11 NOTE — Patient Instructions (Addendum)
Stay off BP medication. Avoid mold, have some clean out mold when you are not there. Push fluids, take antibiotics for urinary infection. Call if not improving. We will call with culture results.

## 2012-07-15 LAB — URINE CULTURE: Colony Count: >=100000

## 2012-07-17 ENCOUNTER — Telehealth: Payer: Self-pay | Admitting: Family Medicine

## 2012-07-17 ENCOUNTER — Encounter: Payer: Self-pay | Admitting: *Deleted

## 2012-07-17 NOTE — Telephone Encounter (Signed)
Message left notifying patient. Advised to call with anymore questions.

## 2012-07-17 NOTE — Telephone Encounter (Signed)
Caller: Leetta/Patient; Patient Name: Sandra Ferrell; PCP: Kerby Nora [facility]); Best Callback Phone Number: [phone]; Reason for call: She is inquiring about getting the shingles Vaccination.  She has had the chickenpox but is concerned that she would get Shingles with her ongoing health issues.  Is is available in the office.  Reviewed standing orders information not found.  Reviewed Health information on Zostavax Immunization and it recommends ; "If you have had the Chicken pox, a vaccine Called Zostavax is available for people 34 years of age and older. The vaccine can help prevent or lessen the symptoms. It cannot be used to treat shingles once you have it". Caller is age 51.  Advised that I would forward her request to the office for review. Understanding expressed.

## 2012-07-17 NOTE — Telephone Encounter (Signed)
Patient notified as instructed. 

## 2012-07-17 NOTE — Telephone Encounter (Signed)
It is not indicated till age 52. She does not need this now.

## 2012-08-05 ENCOUNTER — Encounter: Payer: Self-pay | Admitting: Family Medicine

## 2012-08-05 ENCOUNTER — Ambulatory Visit (INDEPENDENT_AMBULATORY_CARE_PROVIDER_SITE_OTHER): Payer: Medicare Other | Admitting: Family Medicine

## 2012-08-05 VITALS — BP 120/80 | HR 96 | Temp 98.3°F | Resp 20 | Ht 60.0 in | Wt 234.5 lb

## 2012-08-05 DIAGNOSIS — N390 Urinary tract infection, site not specified: Secondary | ICD-10-CM

## 2012-08-05 DIAGNOSIS — J45901 Unspecified asthma with (acute) exacerbation: Secondary | ICD-10-CM

## 2012-08-05 DIAGNOSIS — B379 Candidiasis, unspecified: Secondary | ICD-10-CM

## 2012-08-05 DIAGNOSIS — R300 Dysuria: Secondary | ICD-10-CM

## 2012-08-05 LAB — POCT URINALYSIS DIPSTICK
Glucose, UA: NEGATIVE
Nitrite, UA: NEGATIVE
Spec Grav, UA: 1.01
Urobilinogen, UA: 0.2

## 2012-08-05 MED ORDER — LEVOFLOXACIN 500 MG PO TABS: 500.0000 mg | ORAL_TABLET | Freq: Every day | ORAL | Status: DC

## 2012-08-05 MED ORDER — GUAIFENESIN-CODEINE 100-10 MG/5ML PO SYRP: 5.0000 mL | ORAL_SOLUTION | Freq: Three times a day (TID) | ORAL | Status: DC | PRN

## 2012-08-05 MED ORDER — PREDNISONE 20 MG PO TABS: ORAL_TABLET | ORAL | Status: DC

## 2012-08-05 MED ORDER — NYSTATIN 100000 UNIT/GM EX CREA: TOPICAL_CREAM | Freq: Two times a day (BID) | CUTANEOUS | Status: DC

## 2012-08-05 NOTE — Progress Notes (Signed)
  Subjective:    Patient ID: Sandra Ferrell, female    DOB: 1961/03/07, 51 y.o.   MRN: [id number]  Cough This is a new problem. The current episode started 1 to 4 weeks ago. The problem has been gradually worsening. The cough is productive of purulent sputum. Associated symptoms include chest pain, nasal congestion, a sore throat, shortness of breath and wheezing. Pertinent negatives include no ear congestion, ear pain, fever, headaches or postnasal drip. Treatments tried: tussin DM, alkaseltzer plus. The treatment provided mild relief. Her past medical history is significant for asthma. smoker   Has noted rash itchy, red in intertriginous areas groin and pannus.Marland KitchenMarland Kitchenpresent x 1 week. Vagisil helped some.   Also has noted several days of dysuria.  Some urinary frequency and urgency.   Review of Systems  Constitutional: Negative for fever.  HENT: Positive for sore throat. Negative for ear pain and postnasal drip.   Respiratory: Positive for cough, shortness of breath and wheezing.   Cardiovascular: Positive for chest pain.  Neurological: Negative for headaches.       Objective:   Physical Exam  Constitutional: Vital signs are normal. She appears well-developed and well-nourished. She is cooperative.  Non-toxic appearance. She does not appear ill. No distress.  HENT:  Head: Normocephalic.  Right Ear: Hearing, tympanic membrane, external ear and ear canal normal. Tympanic membrane is not erythematous, not retracted and not bulging.  Left Ear: Hearing, tympanic membrane, external ear and ear canal normal. Tympanic membrane is not erythematous, not retracted and not bulging.  Nose: Mucosal edema and rhinorrhea present. Right sinus exhibits no maxillary sinus tenderness and no frontal sinus tenderness. Left sinus exhibits no maxillary sinus tenderness and no frontal sinus tenderness.  Mouth/Throat: Uvula is midline, oropharynx is clear and moist and mucous membranes are normal.  Eyes:  Conjunctivae normal, EOM and lids are normal. Pupils are equal, round, and reactive to light. No foreign bodies found.  Neck: Trachea normal and normal range of motion. Neck supple. Carotid bruit is not present. No mass and no thyromegaly present.  Cardiovascular: Normal rate, regular rhythm, S1 normal, S2 normal, normal heart sounds, intact distal pulses and normal pulses.  Exam reveals no gallop and no friction rub.   No murmur heard. Pulmonary/Chest: Effort normal and breath sounds normal. Not tachypneic. No respiratory distress. She has no decreased breath sounds. She has no wheezes. She has no rhonchi. She has no rales.  Abdominal: Soft. Bowel sounds are normal. There is no tenderness.  Neurological: She is alert.  Skin: Skin is warm, dry and intact. Rash noted.       Erythema with leading edge and satellite lesions in groin and under pannus B  Psychiatric: Her speech is normal and behavior is normal. Judgment normal. Her mood appears not anxious. Cognition and memory are normal. She does not exhibit a depressed mood.          Assessment & Plan:

## 2012-08-05 NOTE — Patient Instructions (Addendum)
QUIT SMOKING.  Cheratussin at night for cough.  Use albuterol as needed.  Prednisone taper x 6 days.  Complete antibiotic course... levoquin to cover respiratory infection and urinary infection. We will call with urine culture results if antibotic need to be changed.  Treat with nystatin cream.

## 2012-08-06 ENCOUNTER — Other Ambulatory Visit: Payer: Self-pay | Admitting: Family Medicine

## 2012-08-06 DIAGNOSIS — E785 Hyperlipidemia, unspecified: Secondary | ICD-10-CM

## 2012-08-06 DIAGNOSIS — E119 Type 2 diabetes mellitus without complications: Secondary | ICD-10-CM

## 2012-08-06 NOTE — Assessment & Plan Note (Signed)
Pos UA. Reviewed last culture from early 07/2012.Marland KitchenMarland KitchenEcoli  Sensitive to levoquin. Will use this antibiotic to cover bacterial URI as well.  Send for culture given recurrent UTI in past.

## 2012-08-06 NOTE — Assessment & Plan Note (Signed)
Treat with nystatin topical.

## 2012-08-06 NOTE — Assessment & Plan Note (Signed)
Bacteria trigger.. Will use levoquin to cover UTI as well. Cough suppressant, albuterol prn, prednisone taper.  Follow up if not improving.

## 2012-08-06 NOTE — Telephone Encounter (Signed)
Message copied by Excell Seltzer on Wed Aug 06, 2012 12:55 AM ------      Message from: Alvina Chou      Created: Mon Aug 04, 2012  3:44 PM      Regarding: Lab orders for Friday 10.4.13       Labs for a 3 month f/u appt

## 2012-08-06 NOTE — Addendum Note (Signed)
Addended by: Patience Musca on: 08/06/2012 12:04 PM   Modules accepted: Orders

## 2012-08-06 NOTE — Telephone Encounter (Deleted)
[facility] [address] request refill on Cyclobenzaprine last filled 07/11/12 and Ibuprofen.Please advise.

## 2012-08-07 ENCOUNTER — Other Ambulatory Visit: Payer: Self-pay | Admitting: *Deleted

## 2012-08-07 NOTE — Telephone Encounter (Signed)
Electronic refill requests.  Cyclobenzaprine last filled 06/17/12.  Ibuprofen last filled 07/09/12.  Recently had OV.

## 2012-08-07 NOTE — Addendum Note (Signed)
Addended by: Annamarie Major on: 08/07/2012 03:35 PM   Modules accepted: Orders

## 2012-08-07 NOTE — Telephone Encounter (Signed)
Patient's pharmacy requested refills on Cyclobenzaprine and Ibuprofen.  I think I sent the request to you but for some reason, there was also a request for lab orders and it seemed to all get mixed in together so now I'm not sure if it came through to your or not.  Please advise.

## 2012-08-08 ENCOUNTER — Other Ambulatory Visit (INDEPENDENT_AMBULATORY_CARE_PROVIDER_SITE_OTHER): Payer: Medicare Other

## 2012-08-08 ENCOUNTER — Other Ambulatory Visit: Payer: Self-pay | Admitting: *Deleted

## 2012-08-08 DIAGNOSIS — E785 Hyperlipidemia, unspecified: Secondary | ICD-10-CM

## 2012-08-08 DIAGNOSIS — E119 Type 2 diabetes mellitus without complications: Secondary | ICD-10-CM

## 2012-08-08 LAB — COMPREHENSIVE METABOLIC PANEL
AST: 23 U/L (ref 0–37)
Alkaline Phosphatase: 155 U/L — ABNORMAL HIGH (ref 39–117)
BUN: 20 mg/dL (ref 6–23)
Creatinine, Ser: 0.7 mg/dL (ref 0.4–1.2)
Total Bilirubin: 0.4 mg/dL (ref 0.3–1.2)

## 2012-08-08 LAB — URINE CULTURE: Colony Count: >=100000

## 2012-08-08 LAB — LIPID PANEL
HDL: 41.2 mg/dL (ref >39.00)
LDL Cholesterol: 88 mg/dL (ref 0–99)
Total CHOL/HDL Ratio: 4
Triglycerides: 118 mg/dL (ref 0.0–149.0)

## 2012-08-08 MED ORDER — CYCLOBENZAPRINE HCL 10 MG PO TABS: 10.0000 mg | ORAL_TABLET | Freq: Two times a day (BID) | ORAL | Status: DC

## 2012-08-08 MED ORDER — IBUPROFEN 800 MG PO TABS: ORAL_TABLET | ORAL | Status: DC

## 2012-08-08 NOTE — Telephone Encounter (Signed)
Completed.

## 2012-08-08 NOTE — Telephone Encounter (Signed)
Okay to refill both 30, 1 RF

## 2012-08-08 NOTE — Addendum Note (Signed)
Addended by: Kerby Nora E on: 08/08/2012 12:54 AM   Modules accepted: Orders

## 2012-08-08 NOTE — Telephone Encounter (Signed)
x

## 2012-08-11 ENCOUNTER — Telehealth: Payer: Self-pay | Admitting: Family Medicine

## 2012-08-11 NOTE — Telephone Encounter (Signed)
Caller: Ryan/Patient; Phone: [phone]; Reason for Call: Patient calling to speak with Dr.  Ermalene Searing or her nurse regarding her Cyclobenzaprine.  States Dr.  Ermalene Searing gives her a 60 day supply due to taking it twice a day.  States this time Dr.  Ermalene Searing only did it for 30 days twice a day.  Also states still has cough and congestion, wants to know if Dr.  Ermalene Searing wants her to go back on the Mucinex.  Please call her back.  Thanks

## 2012-08-12 NOTE — Telephone Encounter (Signed)
Please correct cyclobenzaprine prescription.. Should have been 60. She can restart mucinex during the day.

## 2012-08-12 NOTE — Telephone Encounter (Signed)
Pharmacy notified to correct script to #60 per Dr. Ermalene Searing. Patient notified as instructed by telephone.

## 2012-08-15 ENCOUNTER — Encounter: Payer: Self-pay | Admitting: Family Medicine

## 2012-08-15 ENCOUNTER — Ambulatory Visit (INDEPENDENT_AMBULATORY_CARE_PROVIDER_SITE_OTHER): Payer: Medicare Other | Admitting: Family Medicine

## 2012-08-15 VITALS — BP 114/68 | HR 96 | Temp 97.7°F | Resp 20 | Ht 60.0 in | Wt 237.5 lb

## 2012-08-15 DIAGNOSIS — J45901 Unspecified asthma with (acute) exacerbation: Secondary | ICD-10-CM

## 2012-08-15 DIAGNOSIS — E785 Hyperlipidemia, unspecified: Secondary | ICD-10-CM

## 2012-08-15 DIAGNOSIS — F411 Generalized anxiety disorder: Secondary | ICD-10-CM

## 2012-08-15 DIAGNOSIS — M549 Dorsalgia, unspecified: Secondary | ICD-10-CM

## 2012-08-15 DIAGNOSIS — IMO0001 Reserved for inherently not codable concepts without codable children: Secondary | ICD-10-CM

## 2012-08-15 DIAGNOSIS — E119 Type 2 diabetes mellitus without complications: Secondary | ICD-10-CM

## 2012-08-15 DIAGNOSIS — I10 Essential (primary) hypertension: Secondary | ICD-10-CM

## 2012-08-15 NOTE — Progress Notes (Signed)
Subjective:    Patient ID: Sandra Ferrell, female    DOB: May 20, 1961, 51 y.o.   MRN: [id number]  HPI Asthma flare resolve. Urinary and yeast symptoms resolved from 10 days ago.  Diabetes: Worsened control but has been on steroid for asthma as well as for back) and has had recent  Bronchitis andUTIs. Lab Results  Component Value Date   HGBA1C 7.4* 08/08/2012  On actos,glucotrol, glucophage  Using medications without difficulties:  Hypoglycemic episodes:None  Hyperglycemic episodes:yes  Feet problems:None  Blood Sugars averaging: 187-200 on prednisone, now  105-120, 145 postprandially eye exam within last year: None   Elevated Cholesterol  At goal on current meds LDL<100. Lab Results  Component Value Date   CHOL 153 08/08/2012   HDL 41.20 08/08/2012   LDLCALC 88 08/08/2012   LDLDIRECT 59.4 09/26/2009   TRIG 118.0 08/08/2012   CHOLHDL 4 08/08/2012  On gemfibrozil, fish oil.  Using medications without problems:None  Muscle aches: None  Diet compliance: Moderate  Exercise:None , planning [employer] starting Other complaints:  Wt Readings from Last 3 Encounters:   05/13/12  235 lb 8 oz (106.822 kg)   04/07/12  234 lb 12 oz (106.482 kg)   02/29/12  236 lb (107.049 kg)   Has gained some weight since prior to April.   Hypertension: Well controlled at goal <130/80 On lotensin.  Using medication without problems or lightheadedness:  Chest pain with exertion:None  Edema:None  Short of breath: back at baseline  Average home BPs: At goal.  Other issues:   Acute on chronic back pain following 2 falls in April and again in June. Seen in ER following second X-ray. Saw Dr. Patsy Lager. Plain films, MRI performed. Some significant degenerative changes seen but pt was improving. Referred to Dr. Regino Schultze PM and R.  She has seen him several time... Set up for spinal injections... She has noted some improvement with epidurals. No immediate surgical indications.   Using hydrocodone  Ibuprofen 1-2 a day,  taking tramadol not as frequently, sometimes not at all,  1 cyclobenzaprine at bedtime.  Using hydrocodone for breakthrough back pain only ever 3 days during the week, not on weekends given not watching step-grandaughter  Depression/Anxiety:  Improved today on amitryptiline , xanax prn.    Review of Systems  Constitutional: Negative for fever and fatigue.  HENT: Negative for ear pain.   Eyes: Negative for pain.  Respiratory: Negative for chest tightness and shortness of breath.   Cardiovascular: Negative for chest pain, palpitations and leg swelling.  Gastrointestinal: Negative for abdominal pain.  Genitourinary: Negative for dysuria.       Objective:   Physical Exam  Constitutional: Vital signs are normal. She appears well-developed and well-nourished. She is cooperative.  Non-toxic appearance. She does not appear ill. No distress.       Morbidly obese  HENT:  Head: Normocephalic.  Right Ear: Hearing, tympanic membrane, external ear and ear canal normal. Tympanic membrane is not erythematous, not retracted and not bulging.  Left Ear: Hearing, tympanic membrane, external ear and ear canal normal. Tympanic membrane is not erythematous, not retracted and not bulging.  Nose: No mucosal edema or rhinorrhea. Right sinus exhibits no maxillary sinus tenderness and no frontal sinus tenderness. Left sinus exhibits no maxillary sinus tenderness and no frontal sinus tenderness.  Mouth/Throat: Uvula is midline, oropharynx is clear and moist and mucous membranes are normal.  Eyes: Conjunctivae normal, EOM and lids are normal. Pupils are equal, round, and reactive to light. No foreign  bodies found.  Neck: Trachea normal and normal range of motion. Neck supple. Carotid bruit is not present. No mass and no thyromegaly present.  Cardiovascular: Normal rate, regular rhythm, S1 normal, S2 normal, normal heart sounds, intact distal pulses and normal pulses.  Exam reveals no gallop and no friction rub.   No  murmur heard. Pulmonary/Chest: Effort normal and breath sounds normal. Not tachypneic. No respiratory distress. She has no decreased breath sounds. She has no wheezes. She has no rhonchi. She has no rales.  Abdominal: Soft. Normal appearance and bowel sounds are normal. There is no tenderness.  Neurological: She is alert.  Skin: Skin is warm, dry and intact. No rash noted.  Psychiatric: Her speech is normal and behavior is normal. Judgment and thought content normal. Her mood appears not anxious. Cognition and memory are normal. She does not exhibit a depressed mood.    Diabetic foot exam: Normal inspection No skin breakdown No calluses  Normal DP pulses Normal sensation to light touch and monofilament Nails normal       Assessment & Plan:

## 2012-08-15 NOTE — Assessment & Plan Note (Signed)
Stable control on amitryptiline and xanax.

## 2012-08-15 NOTE — Assessment & Plan Note (Signed)
Resolved

## 2012-08-15 NOTE — Assessment & Plan Note (Signed)
Worsened control form last check here over a year ago. Likely due to multiple infections and steroids. Continue current meds and recheck in 3 months.

## 2012-08-15 NOTE — Assessment & Plan Note (Signed)
Improved. Using less pain medication at this time. Encouraged her to get regular exercise and to work on weight loss.

## 2012-08-15 NOTE — Assessment & Plan Note (Signed)
Great control on gemfibrozil and fish oil.

## 2012-08-15 NOTE — Patient Instructions (Addendum)
Don't forget yearly DM eye exam. Work on  exercise, weight loss, healthy eating habits.

## 2012-08-15 NOTE — Assessment & Plan Note (Signed)
Well controlled. Continue current medication.  

## 2012-08-17 ENCOUNTER — Emergency Department (INDEPENDENT_AMBULATORY_CARE_PROVIDER_SITE_OTHER)
Admission: EM | Admit: 2012-08-17 | Discharge: 2012-08-17 | Disposition: A | Discharge status: Home / Self Care | Payer: Medicare Other | Source: Home / Self Care

## 2012-08-17 ENCOUNTER — Encounter (HOSPITAL_COMMUNITY): Payer: Self-pay | Admitting: *Deleted

## 2012-08-17 ENCOUNTER — Emergency Department (INDEPENDENT_AMBULATORY_CARE_PROVIDER_SITE_OTHER): Payer: Medicare Other

## 2012-08-17 DIAGNOSIS — S8000XA Contusion of unspecified knee, initial encounter: Secondary | ICD-10-CM

## 2012-08-17 DIAGNOSIS — S81819A Laceration without foreign body, unspecified lower leg, initial encounter: Secondary | ICD-10-CM

## 2012-08-17 MED ORDER — BACITRACIN-NEOMYCIN-POLYMYXIN OINTMENT TUBE
TOPICAL_OINTMENT | Freq: Once | CUTANEOUS | Status: AC
  Administered 2012-08-17: 17:00:00 via TOPICAL

## 2012-08-17 NOTE — ED Provider Notes (Signed)
Medical screening examination/treatment/procedure(s) were performed by resident physician or non-physician practitioner and as supervising physician I was immediately available for consultation/collaboration.   Estephanie Hubbs DOUGLAS MD.    Arryanna Holquin D Keldrick Pomplun, MD 08/17/12 1919 

## 2012-08-17 NOTE — ED Provider Notes (Signed)
History     CSN: [id number]  Arrival date & time 08/17/12  1426   None     Chief Complaint  Patient presents with  . Fall  . Extremity Laceration    (Consider location/radiation/quality/duration/timing/severity/associated sxs/prior treatment) HPI Comments: 51 year old morbidly obese female sustained a mechanical fall approximately 1:30 PM today; she fell onto her right knee. She is complaining of pain and tenderness to the right knee. She also sustained a laceration over the knee. Is  complaining of mild left back pain however she has chronic back pain; she is unsure if this is new or old. Denies striking her head or injuring her neck. She is fully alert and oriented and otherwise no acute distress   Past Medical History  Diagnosis Date  . Allergic rhinitis, cause unspecified   . Anxiety state, unspecified   . Extrinsic asthma with exacerbation   . Backache, unspecified   . Depressive disorder, not elsewhere classified   . Type II or unspecified type diabetes mellitus without mention of complication, not stated as uncontrolled   . Esophageal reflux   . Other and unspecified hyperlipidemia   . Osteoarthrosis, unspecified whether generalized or localized, unspecified site   . Other screening mammogram   . Routine gynecological examination   . Unspecified sleep apnea   . Tobacco use disorder   . Personal history of unspecified urinary disorder   . Routine general medical examination at a health care facility   . Heart murmur   . Hypertension   . Calculus of kidney   . Fibromyalgia     Past Surgical History  Procedure Date  . Appendectomy 1973  . Cholecystectomy 08/2006  . Transthoracic echocardiogram 02/2011    mild LVH, nl EF, mild diastolic dysfunction, no wall motion abnl    Family History  Problem Relation Age of Onset  . Stroke Father   . Colon cancer Cousin     History  Substance Use Topics  . Smoking status: Current Every Day Smoker -- 1.0 packs/day for  35 years    Types: Cigarettes  . Smokeless tobacco: Never Used  . Alcohol Use: No    OB History    Grav Para Term Preterm Abortions TAB SAB Ect Mult Living                  Review of Systems  Constitutional: Negative for fever, chills and activity change.  HENT: Negative.   Respiratory: Negative.   Cardiovascular: Negative.   Musculoskeletal: Positive for back pain, joint swelling and gait problem.       As per HPI  Skin: Negative for color change, pallor and rash.  Neurological: Negative.     Allergies  Benazepril; Aspirin; Diclofenac sodium; Fluticasone-salmeterol; Nsaids; and Penicillins  Home Medications   Current Outpatient Rx  Name Route Sig Dispense Refill  . ALBUTEROL SULFATE HFA 108 (90 BASE) MCG/ACT IN AERS Inhalation Inhale 2 puffs into the lungs every 4 (four) hours as needed for wheezing. 1 Inhaler 5  . ALPRAZOLAM 0.5 MG PO TABS Oral Take 1 tablet (0.5 mg total) by mouth 3 (three) times daily as needed for sleep or anxiety. 90 tablet 0  . AMITRIPTYLINE HCL 50 MG PO TABS Oral Take 50 mg by mouth at bedtime.    . ASPIRIN EC 81 MG PO TBEC Oral Take 1 tablet (81 mg total) by mouth daily.    . BUDESONIDE-FORMOTEROL FUMARATE 80-4.5 MCG/ACT IN AERO Inhalation Inhale 2 puffs into the lungs 2 (two) times daily.  1 Inhaler 11  . CALCIUM CARBONATE ANTACID 1000 MG PO CHEW Oral Chew 1,000 mg by mouth as needed. heartburn    . CHLORPHENIRAMINE MALEATE 4 MG PO TABS Oral Take 4 mg by mouth 2 (two) times daily as needed.      . CYCLOBENZAPRINE HCL 10 MG PO TABS Oral Take 1 tablet (10 mg total) by mouth 2 (two) times daily. 30 tablet 1  . OMEGA-3 FATTY ACIDS 1000 MG PO CAPS Oral Take 1 g by mouth daily.      Marland Kitchen GEMFIBROZIL 600 MG PO TABS  TAKE 1 TABLET BY MOUTH TWICE A DAY 60 tablet 11  . GLIPIZIDE 10 MG PO TABS Oral Take 10 mg by mouth 2 (two) times daily before a meal.    . HYDROCODONE-ACETAMINOPHEN 5-325 MG PO TABS Oral Take 1 tablet by mouth every 6 (six) hours as needed. For  pain    . IBUPROFEN 800 MG PO TABS  TAKE 1 TABLET (800 MG TOTAL) BY MOUTH EVERY 8 (EIGHT) HOURS AS NEEDED FOR PAIN. 30 tablet 1  . METFORMIN HCL 1000 MG PO TABS  TAKE 1 TABLET BY MOUTH EVERY MORNING AND 1 TABLET EVERY EVENING 60 tablet 4  . PIOGLITAZONE HCL 15 MG PO TABS Oral Take 1 tablet (15 mg total) by mouth daily. 30 tablet 6  . BENZOCAINE-RESORCINOL 5-2 % VA CREA Vaginal Place 1 application vaginally at bedtime.    Elmo Putt DM PO Oral Take by mouth. As directed    . GUAIFENESIN-CODEINE 100-10 MG/5ML PO SYRP Oral Take 5 mLs by mouth 3 (three) times daily as needed for cough. 120 mL 0  . LEVOFLOXACIN 500 MG PO TABS Oral Take 1 tablet (500 mg total) by mouth daily. 7 tablet 0  . NYSTATIN 100000 UNIT/GM EX CREA Topical Apply topically 2 (two) times daily. 30 g 0    BP 111/77  Pulse 100  Temp 97.9 F (36.6 C) (Oral)  Resp 14  SpO2 97%  Physical Exam  Constitutional: She is oriented to person, place, and time. She appears well-developed and well-nourished. No distress.  HENT:  Head: Normocephalic and atraumatic.  Eyes: EOM are normal. Pupils are equal, round, and reactive to light.  Neck: Normal range of motion. Neck supple.  Pulmonary/Chest: Effort normal and breath sounds normal. No respiratory distress.  Musculoskeletal: Normal range of motion. She exhibits edema and tenderness.       Tenderness about the right knee to include the anterior lateral and medial aspect. There is minimal edema appreciated. No discoloration or deformity. Light palpation produces moderate to severe pain. She is able to flex her knee 100 and extend to 180. Distal neurovascular and motor sensory is intact right pedal pulse 1+  Neurological: She is alert and oriented to person, place, and time. No cranial nerve deficit.  Skin: Skin is warm and dry.  Psychiatric: She has a normal mood and affect.    ED Course  LACERATION REPAIR Date/Time: 08/17/2012 5:51 PM Performed by: Phineas Real, Jerard Bays Authorized by: Phineas Real,  Madhuri Vacca Consent: Verbal consent obtained. Risks and benefits: risks, benefits and alternatives were discussed Consent given by: patient Patient understanding: patient states understanding of the procedure being performed Patient identity confirmed: verbally with patient Body area: lower extremity Location details: left lower leg Laceration length: 4.5 cm Contamination: The wound is contaminated. Foreign bodies: unknown Tendon involvement: none Nerve involvement: none Vascular damage: no Anesthesia: local infiltration Local anesthetic: lidocaine 2% with epinephrine Patient sedated: no Irrigation solution: saline Irrigation method:  syringe Amount of cleaning: standard Debridement: minimal Degree of undermining: none Skin closure: 4-0 nylon Number of sutures: 6 Technique: simple Approximation: close Approximation difficulty: simple Dressing: 4x4 sterile gauze and antibiotic ointment Patient tolerance: Patient tolerated the procedure well with no immediate complications.   (including critical care time)  Labs Reviewed - No data to display Dg Knee Complete 4 Views Right  08/17/2012  *RADIOLOGY REPORT*  Clinical Data: History of fall on right knee.  RIGHT KNEE - COMPLETE 4+ VIEW  Comparison: No priors.  Findings: Four views of the right knee demonstrate no acute fracture, subluxation, dislocation, joint or soft tissue abnormality.  IMPRESSION: 1.  No acute radiographic abnormality of the right knee.   Original Report Authenticated By: Florencia Reasons, M.D.      1. Knee contusion   2. Laceration of lower leg without complication       MDM  Dg Knee Complete 4 Views Right  08/17/2012  *RADIOLOGY REPORT*  Clinical Data: History of fall on right knee.  RIGHT KNEE - COMPLETE 4+ VIEW  Comparison: No priors.  Findings: Four views of the right knee demonstrate no acute fracture, subluxation, dislocation, joint or soft tissue abnormality.  IMPRESSION: 1.  No acute radiographic  abnormality of the right knee.   Original Report Authenticated By: Florencia Reasons, M.D.    Suture repair of the laceration of the right lower extremity. Dressing applied then a straight leg knee immobilizer applied She has an appointment with the orthopedist in the next week she will followup with him for her knee. He has problems before then she should call for an earlier appointment or may return. Weightbearing as tolerated Return for suture removal 7 days        Hayden Rasmussen, NP 08/17/12 1756

## 2012-08-17 NOTE — ED Notes (Signed)
Reports falling when stepping out of car approx 2 hrs ago onto right knee; states she "heard a pop" in right knee.  C/O right knee pain and laceration.  States difficulty ambulating on RLE.  Laceration slowly oozing.  Also c/o low back pain "like a strain or a pull".

## 2012-08-17 NOTE — ED Notes (Signed)
Bacitracin oint, Telfa, sterile 4x4 dressing applied.  Wound care and S/S infection reviewed w/ pt & spouse.  Instructed to have sutures removed in 7 days per D. Mabe instruction.

## 2012-08-17 NOTE — ED Notes (Signed)
Suture placement in progress. 

## 2012-09-18 ENCOUNTER — Other Ambulatory Visit: Payer: Self-pay | Admitting: Family Medicine

## 2012-09-19 NOTE — Telephone Encounter (Signed)
Electronic refill request

## 2012-09-29 ENCOUNTER — Other Ambulatory Visit: Payer: Self-pay | Admitting: Family Medicine

## 2012-09-29 MED ORDER — GLIPIZIDE 10 MG PO TABS: 10.0000 mg | ORAL_TABLET | Freq: Two times a day (BID) | ORAL | Status: DC

## 2012-10-18 ENCOUNTER — Other Ambulatory Visit: Payer: Self-pay | Admitting: Family Medicine

## 2012-10-23 ENCOUNTER — Other Ambulatory Visit: Payer: Self-pay | Admitting: Family Medicine

## 2012-10-27 ENCOUNTER — Other Ambulatory Visit: Payer: Self-pay | Admitting: *Deleted

## 2012-10-27 MED ORDER — GEMFIBROZIL 600 MG PO TABS: ORAL_TABLET | ORAL | Status: DC

## 2012-11-14 ENCOUNTER — Other Ambulatory Visit: Payer: Medicare Other

## 2012-11-17 ENCOUNTER — Other Ambulatory Visit (INDEPENDENT_AMBULATORY_CARE_PROVIDER_SITE_OTHER): Payer: Medicare Other

## 2012-11-17 DIAGNOSIS — E119 Type 2 diabetes mellitus without complications: Secondary | ICD-10-CM

## 2012-11-17 LAB — HEMOGLOBIN A1C: Hgb A1c MFr Bld: 11.8 % — ABNORMAL HIGH (ref 4.6–6.5)

## 2012-11-20 ENCOUNTER — Other Ambulatory Visit: Payer: Self-pay | Admitting: Family Medicine

## 2012-11-21 ENCOUNTER — Ambulatory Visit (INDEPENDENT_AMBULATORY_CARE_PROVIDER_SITE_OTHER): Payer: Medicare Other | Admitting: Family Medicine

## 2012-11-21 ENCOUNTER — Encounter: Payer: Self-pay | Admitting: Family Medicine

## 2012-11-21 VITALS — BP 120/70 | HR 98 | Temp 98.7°F | Ht 60.0 in | Wt 223.5 lb

## 2012-11-21 DIAGNOSIS — I10 Essential (primary) hypertension: Secondary | ICD-10-CM

## 2012-11-21 DIAGNOSIS — Z0000 Encounter for general adult medical examination without abnormal findings: Secondary | ICD-10-CM

## 2012-11-21 DIAGNOSIS — E785 Hyperlipidemia, unspecified: Secondary | ICD-10-CM

## 2012-11-21 DIAGNOSIS — E119 Type 2 diabetes mellitus without complications: Secondary | ICD-10-CM

## 2012-11-21 DIAGNOSIS — M549 Dorsalgia, unspecified: Secondary | ICD-10-CM

## 2012-11-21 NOTE — Assessment & Plan Note (Signed)
Poor control.. Also possible DM neuropathy foot pain.  Will try increasing amitryptiline to 75 mg daily. May need trial on neurontin.

## 2012-11-21 NOTE — Patient Instructions (Addendum)
Continue weight loss, exercise and healthy eating. Limit ibuprofen. Try higher dose of amitriptyline at bedtime, 1 and 1/2 (75 mg ) Call if leg pain, body pain not improved. We can always try a high dose or try neurontin. Can try zyrtec for allergies. No decongestant.  Call on own to set up mammogram.

## 2012-11-21 NOTE — Assessment & Plan Note (Signed)
Well controlled. Continue current medication.  

## 2012-11-21 NOTE — Assessment & Plan Note (Signed)
Poor control likely due to steroid injections. Now FBS decreasing. Has lost 15 lbs.  Encouraged exercise, weight loss, healthy eating habits.

## 2012-11-21 NOTE — Progress Notes (Signed)
HPI  I have personally reviewed the Medicare Annual Wellness questionnaire and have noted  1. The patient's medical and social history  2. Their use of alcohol, tobacco or illicit drugs  3. Their current medications and supplements  4. The patient's functional ability including ADL's, fall risks, home safety risks and hearing or visual  impairment.  5. Diet and physical activities  6. Evidence for depression or mood disorders  The patients weight, height, BMI and visual acuity have been recorded in the chart  I have made referrals, counseling and provided education to the patient based review of the above and I have provided the pt with a written personalized care plan for preventive services.   Diabetes: Very poor control.. previously well controlled on Actos, glucotrol, glucophage , had steroid injections in 10/2012 in feet Lab Results  Component Value Date   HGBA1C 11.8* 11/17/2012  Using medications without difficulties:None  Hypoglycemic episodes:None  Hyperglycemic episodes: high 200s after injections Feet problems:None  Blood Sugars averaging: Checking daily to twice a day.  Now blood sugars 120 FBS  eye exam within last year: yes   She has decreased candy, sweets. Has lost 15 lbs. Wt Readings from Last 3 Encounters:  11/21/12 223 lb 8 oz (101.379 kg)  08/15/12 237 lb 8 oz (107.729 kg)  08/05/12 234 lb 8 oz (106.369 kg)  Has [employer] nurse who is calling her for an update.   Hypertension: BP at goal on lotensin  BP Readings from Last 3 Encounters:  11/21/12 120/70  08/17/12 111/77  08/15/12 114/68  Using medication without problems or lightheadedness:  Chest pain with exertion:None  Edema:no change  Short of breath: See below.  Average home BPs: not checking  Other issues: Had stopped exercising but restarted. Has joined [facility]  Low back pain and leg ache.  Dr. Regino Schultze, [facility]. Feet hurting. Some burning in feet.. Concerned about DM neuropathy component.  She is  hesitant to restart back injections.  Elevated Cholesterol: Well controlled on lopid and fish oil with mild trig elevation at this point. LDL at goal with diet <100.  Lab Results  Component Value Date   CHOL 153 08/08/2012   HDL 41.20 08/08/2012   LDLCALC 88 08/08/2012   LDLDIRECT 59.4 09/26/2009   TRIG 118.0 08/08/2012   CHOLHDL 4 08/08/2012  Using medications without problems:None  Muscle aches: None   Fibromyalgia: Using flexeril 2 times a day. On elavil at bedtime. Using tramadol for pain, every few days... Using ibuprofen occ if milder pain.  No Stomach irritation.   Has never been on higher dose of amitryptiline.  Depression, anxiety, stable. On elavil. Using xanax twice a day.   Having allergy symptoms from cleaning out study yesterday.  Using chlortab.  Review of Systems  Constitutional: Negative for fever, fatigue and unexpected weight change.  HENT: Negative for ear pain, congestion, sore throat, sneezing, trouble swallowing and sinus pressure.  Eyes: Negative for pain and itching.  Respiratory: Negative for cough, shortness of breath and wheezing.  Cardiovascular: Negative for chest pain, palpitations and leg swelling.  Gastrointestinal: Negative for nausea, abdominal pain, diarrhea, constipation and blood in stool.  Genitourinary: Negative for dysuria, hematuria, vaginal discharge, difficulty urinating and menstrual problem.  Musculoskeletal:  Body pain... Wants to lose weight to help with fibromyalgia control.  Skin: Negative for rash.  Neurological: Negative for syncope, weakness, light-headedness, numbness and headaches.  Psychiatric/Behavioral: Negative for confusion and dysphoric mood. The patient is not nervous/anxious.    Objective:  Physical Exam  Constitutional: Vital signs are normal. She appears well-developed and well-nourished. She is cooperative. Non-toxic appearance. She does not appear ill. No distress.  obese  HENT:  Head: Normocephalic.  Right  Ear: Hearing, tympanic membrane, external ear and ear canal normal.  Left Ear: Hearing, tympanic membrane, external ear and ear canal normal.  Mouth/Throat: Oropharynx is clear and moist.  Mucus in nares  Eyes: Conjunctivae, EOM and lids are normal. Pupils are equal, round, and reactive to light. No foreign bodies found.  Neck: Trachea normal and normal range of motion. Neck supple. Carotid bruit is not present. No mass and no thyromegaly present.  Cardiovascular: Normal rate, regular rhythm, S1 normal, S2 normal, normal heart sounds and intact distal pulses. Exam reveals no gallop.  No murmur heard.  Pulmonary/Chest: Effort normal and breath sounds normal. No respiratory distress. She has no wheezes. She has no rhonchi. She has no rales.  Abdominal: Soft. Normal appearance and bowel sounds are normal. She exhibits no distension, no fluid wave, no abdominal bruit and no mass. There is no hepatosplenomegaly. There is no tenderness. There is no rebound, no guarding and no CVA tenderness. No hernia.  Genitourinary: Rectum normal, vagina normal and uterus normal. No breast swelling, tenderness, discharge or bleeding. Pelvic exam was performed with patient prone. There is no rash, tenderness or lesion on the right labia. There is no rash, tenderness or lesion on the left labia. Uterus is not enlarged and not tender. Cervix exhibits no motion tenderness, no discharge and no friability. Right adnexum displays no mass, no tenderness and no fullness. Left adnexum displays no mass, no tenderness and no fullness.  Lymphadenopathy:  She has no cervical adenopathy.  She has no axillary adenopathy.  Neurological: She is alert. She has normal strength. No cranial nerve deficit or sensory deficit.  Skin: Skin is warm, dry and intact. No rash noted.  Psychiatric: Her speech is normal and behavior is normal. Judgment and thought content normal. Her mood appears not anxious. Cognition and memory are normal. She does  not exhibit a depressed mood.  Flat affect, but laughs when appropriate    Assessment & Plan:   Annual Medicare Wellness: The patient's preventative maintenance and recommended screening tests for an annual wellness exam were reviewed in full today.  Brought up to date unless services declined.  Counselled on the importance of diet, exercise, and its role in overall health and mortality.  The patient's FH and SH was reviewed, including their home life, tobacco status, and drug and alcohol status.   Up to date with tetanus and PNA vac.  Flu shot given today. Due for Tdap next year. Nml mammo Will set up  Colonoscopy: 2012 [facility] Pap and DVE last pap nml 07/2011,  DVE yearly, pap on q3 year schedule, next due after 07/2014

## 2012-11-21 NOTE — Assessment & Plan Note (Signed)
At goal on current meds. 

## 2012-12-03 ENCOUNTER — Other Ambulatory Visit: Payer: Self-pay | Admitting: *Deleted

## 2012-12-03 MED ORDER — GEMFIBROZIL 600 MG PO TABS: ORAL_TABLET | ORAL | Status: DC

## 2012-12-18 ENCOUNTER — Other Ambulatory Visit: Payer: Self-pay | Admitting: Family Medicine

## 2012-12-22 ENCOUNTER — Telehealth: Payer: Self-pay | Admitting: Family Medicine

## 2012-12-22 NOTE — Telephone Encounter (Signed)
Called to follow up Alprazolam 0.5 mg refill request.  Seen 11/21/12.  Pharmacy says they submitted refill request but office has not replied.  Says this happens "every month."  Aware delay may have been related to snow storm/closing of office. Has 2 tabs remaining.  [facility].  Please follow up ASAP.

## 2012-12-22 NOTE — Telephone Encounter (Signed)
#  90, 0 refills  Done   Hannah Beat, MD 12/22/2012, 5:09 PM

## 2012-12-22 NOTE — Telephone Encounter (Signed)
Call pharmacy - as far as i can tell, i do not see a request

## 2012-12-27 ENCOUNTER — Other Ambulatory Visit: Payer: Self-pay | Admitting: Family Medicine

## 2012-12-29 ENCOUNTER — Emergency Department (HOSPITAL_COMMUNITY)
Admission: EM | Admit: 2012-12-29 | Discharge: 2012-12-30 | Disposition: A | Discharge status: Home / Self Care | Payer: Medicare Other | Attending: Emergency Medicine | Admitting: Emergency Medicine

## 2012-12-29 ENCOUNTER — Telehealth: Payer: Self-pay | Admitting: Family Medicine

## 2012-12-29 ENCOUNTER — Encounter (HOSPITAL_COMMUNITY): Payer: Self-pay | Admitting: *Deleted

## 2012-12-29 DIAGNOSIS — E785 Hyperlipidemia, unspecified: Secondary | ICD-10-CM | POA: Insufficient documentation

## 2012-12-29 DIAGNOSIS — F411 Generalized anxiety disorder: Secondary | ICD-10-CM | POA: Insufficient documentation

## 2012-12-29 DIAGNOSIS — R739 Hyperglycemia, unspecified: Secondary | ICD-10-CM

## 2012-12-29 DIAGNOSIS — N390 Urinary tract infection, site not specified: Secondary | ICD-10-CM

## 2012-12-29 DIAGNOSIS — Z79899 Other long term (current) drug therapy: Secondary | ICD-10-CM | POA: Insufficient documentation

## 2012-12-29 DIAGNOSIS — I10 Essential (primary) hypertension: Secondary | ICD-10-CM | POA: Insufficient documentation

## 2012-12-29 DIAGNOSIS — IMO0001 Reserved for inherently not codable concepts without codable children: Secondary | ICD-10-CM | POA: Insufficient documentation

## 2012-12-29 DIAGNOSIS — Z8719 Personal history of other diseases of the digestive system: Secondary | ICD-10-CM | POA: Insufficient documentation

## 2012-12-29 DIAGNOSIS — Z87442 Personal history of urinary calculi: Secondary | ICD-10-CM | POA: Insufficient documentation

## 2012-12-29 DIAGNOSIS — J45901 Unspecified asthma with (acute) exacerbation: Secondary | ICD-10-CM | POA: Insufficient documentation

## 2012-12-29 DIAGNOSIS — E1169 Type 2 diabetes mellitus with other specified complication: Secondary | ICD-10-CM | POA: Insufficient documentation

## 2012-12-29 DIAGNOSIS — Z7902 Long term (current) use of antithrombotics/antiplatelets: Secondary | ICD-10-CM | POA: Insufficient documentation

## 2012-12-29 DIAGNOSIS — F3289 Other specified depressive episodes: Secondary | ICD-10-CM | POA: Insufficient documentation

## 2012-12-29 DIAGNOSIS — Z8739 Personal history of other diseases of the musculoskeletal system and connective tissue: Secondary | ICD-10-CM | POA: Insufficient documentation

## 2012-12-29 DIAGNOSIS — R011 Cardiac murmur, unspecified: Secondary | ICD-10-CM | POA: Insufficient documentation

## 2012-12-29 DIAGNOSIS — Z87448 Personal history of other diseases of urinary system: Secondary | ICD-10-CM | POA: Insufficient documentation

## 2012-12-29 DIAGNOSIS — F17200 Nicotine dependence, unspecified, uncomplicated: Secondary | ICD-10-CM | POA: Insufficient documentation

## 2012-12-29 LAB — GLUCOSE, CAPILLARY

## 2012-12-29 MED ORDER — SODIUM CHLORIDE 0.9 % IV SOLN
1000.0000 mL | Freq: Once | INTRAVENOUS | Status: AC
  Administered 2012-12-30: 1000 mL via INTRAVENOUS

## 2012-12-29 MED ORDER — SODIUM CHLORIDE 0.9 % IV SOLN: 1000.0000 mL | INTRAVENOUS | Status: DC

## 2012-12-29 NOTE — ED Notes (Signed)
Pt reports hyperglycemia x2 weeks - pt has been experiencing CBGs >250 - pt reports she has not been eating too much recently - pt states she has had a steroid shot in her back in December. Pt states she has been taking her medications as prescribed. Pt called her PCP and has an appointment in the a.m.

## 2012-12-29 NOTE — Telephone Encounter (Signed)
I believe you stated an appointment has been made for pt tommorow? Correct?

## 2012-12-29 NOTE — ED Provider Notes (Signed)
History     CSN: [id number]  Arrival date & time 12/29/12  2220   First MD Initiated Contact with Patient 12/29/12 2335      Chief Complaint  Patient presents with  . Hyperglycemia    HPI Pt has had high blood sugar for the last two weeks.  She has not been able to get it down.  She is running up into the 400s.  Pt had not seen anyone for this but she became concerned about it this evening and came to the ED.  She has been taking her medications and she has been watching her diet. No fevers or vomiting.   No diarrhea.  SHe has had a yeast infection and has been trying OTC medications. Past Medical History  Diagnosis Date  . Allergic rhinitis, cause unspecified   . Anxiety state, unspecified   . Extrinsic asthma with exacerbation   . Backache, unspecified   . Depressive disorder, not elsewhere classified   . Type II or unspecified type diabetes mellitus without mention of complication, not stated as uncontrolled   . Esophageal reflux   . Other and unspecified hyperlipidemia   . Osteoarthrosis, unspecified whether generalized or localized, unspecified site   . Other screening mammogram   . Routine gynecological examination   . Unspecified sleep apnea   . Tobacco use disorder   . Personal history of unspecified urinary disorder   . Routine general medical examination at a health care facility   . Heart murmur   . Hypertension   . Calculus of kidney   . Fibromyalgia     Past Surgical History  Procedure Laterality Date  . Appendectomy  1973  . Cholecystectomy  08/2006  . Transthoracic echocardiogram  02/2011    mild LVH, nl EF, mild diastolic dysfunction, no wall motion abnl    Family History  Problem Relation Age of Onset  . Stroke Father   . Colon cancer Cousin     History  Substance Use Topics  . Smoking status: Current Every Day Smoker -- 1.00 packs/day for 35 years    Types: Cigarettes  . Smokeless tobacco: Never Used  . Alcohol Use: No    OB History   Grav  Para Term Preterm Abortions TAB SAB Ect Mult Living                  Review of Systems  Constitutional: Negative for fever.  Gastrointestinal: Negative for vomiting and diarrhea.  Genitourinary: Negative for dysuria.  All other systems reviewed and are negative.    Allergies  Benazepril; Aspirin; Diclofenac sodium; Fluticasone-salmeterol; Nsaids; and Penicillins  Home Medications   Current Outpatient Rx  Name  Route  Sig  Dispense  Refill  . albuterol (PROAIR HFA) 108 (90 BASE) MCG/ACT inhaler   Inhalation   Inhale 2 puffs into the lungs every 4 (four) hours as needed for wheezing.   1 Inhaler   5   . ALPRAZolam (XANAX) 0.5 MG tablet   Oral   Take 0.5 mg by mouth 3 (three) times daily as needed for sleep or anxiety. Anxiety         . amitriptyline (ELAVIL) 50 MG tablet   Oral   Take 75 mg by mouth at bedtime.          Marland Kitchen aspirin EC 81 MG EC tablet   Oral   Take 1 tablet (81 mg total) by mouth daily.         . benzocaine-resorcinol (VAGISIL) 5-2 %  vaginal cream   Vaginal   Place 1 application vaginally at bedtime.         . budesonide-formoterol (SYMBICORT) 80-4.5 MCG/ACT inhaler   Inhalation   Inhale 2 puffs into the lungs 2 (two) times daily.   1 Inhaler   11   . calcium elemental as carbonate (ANTACID ULTRA STRENGTH) 400 MG tablet   Oral   Chew 1,000 mg by mouth as needed. heartburn         . cyclobenzaprine (FLEXERIL) 10 MG tablet   Oral   Take 10 mg by mouth 2 (two) times daily as needed for muscle spasms.         . fish oil-omega-3 fatty acids 1000 MG capsule   Oral   Take 1 g by mouth daily.           Marland Kitchen gemfibrozil (LOPID) 600 MG tablet   Oral   Take 600 mg by mouth 2 (two) times daily before a meal.         . glipiZIDE (GLUCOTROL) 10 MG tablet   Oral   Take 1 tablet (10 mg total) by mouth 2 (two) times daily before a meal.   60 tablet   3   . guaiFENesin (MUCINEX) 600 MG 12 hr tablet   Oral   Take 1,200 mg by mouth 2 (two)  times daily.         Marland Kitchen HYDROcodone-acetaminophen (NORCO) 5-325 MG per tablet   Oral   Take 1 tablet by mouth every 6 (six) hours as needed. For pain         . ibuprofen (ADVIL,MOTRIN) 800 MG tablet   Oral   Take 800 mg by mouth every 8 (eight) hours as needed for pain. Pain         . levofloxacin (LEVAQUIN) 500 MG tablet   Oral   Take 1 tablet (500 mg total) by mouth daily.   7 tablet   0   . metFORMIN (GLUCOPHAGE) 1000 MG tablet   Oral   Take 1,000 mg by mouth 2 (two) times daily with a meal.         . pioglitazone (ACTOS) 15 MG tablet   Oral   Take 1 tablet (15 mg total) by mouth daily.   30 tablet   6     BP 127/69  Pulse 108  Temp(Src) 97.9 F (36.6 C) (Oral)  Resp 20  Ht 5' (1.524 m)  Wt 215 lb (97.523 kg)  BMI 41.99 kg/m2  SpO2 98%  Physical Exam  Nursing note and vitals reviewed. Constitutional: She appears well-developed and well-nourished. No distress.  Obese   HENT:  Head: Normocephalic and atraumatic.  Right Ear: External ear normal.  Left Ear: External ear normal.  Eyes: Conjunctivae are normal. Right eye exhibits no discharge. Left eye exhibits no discharge. No scleral icterus.  Neck: Neck supple. No tracheal deviation present.  Cardiovascular: Normal rate, regular rhythm and intact distal pulses.   Pulmonary/Chest: Effort normal and breath sounds normal. No stridor. No respiratory distress. She has no wheezes. She has no rales.  Abdominal: Soft. Bowel sounds are normal. She exhibits no distension. There is no tenderness. There is no rebound and no guarding.  Musculoskeletal: She exhibits no edema and no tenderness.  Neurological: She is alert. She has normal strength. No sensory deficit. Cranial nerve deficit:  no gross defecits noted. She exhibits normal muscle tone. She displays no seizure activity. Coordination normal.  Skin: Skin is warm and dry. No  rash noted.  Psychiatric: She has a normal mood and affect.    ED Course  Procedures  (including critical care time) . Labs Reviewed  GLUCOSE, CAPILLARY - Abnormal; Notable for the following:    Glucose-Capillary 355 (*)    All other components within normal limits  CBC WITH DIFFERENTIAL - Abnormal; Notable for the following:    Lymphs Abs 4.1 (*)    All other components within normal limits  BASIC METABOLIC PANEL - Abnormal; Notable for the following:    Sodium 132 (*)    Chloride 95 (*)    Glucose, Bld 354 (*)    All other components within normal limits  URINALYSIS, ROUTINE W REFLEX MICROSCOPIC - Abnormal; Notable for the following:    APPearance CLOUDY (*)    Glucose, UA >1000 (*)    Hgb urine dipstick TRACE (*)    Leukocytes, UA MODERATE (*)    All other components within normal limits  URINE MICROSCOPIC-ADD ON - Abnormal; Notable for the following:    Squamous Epithelial / LPF MANY (*)    Bacteria, UA MANY (*)    All other components within normal limits  URINE CULTURE   No results found.   1. Hyperglycemia without ketosis   2. UTI (urinary tract infection)       MDM  Pt has been having persistent hyperglycemia without dka.  UA suggests UTI which may be precipitating her elevated blood sugars.  Will dc home on oral abx.  She has an appointment tomorrow with her PCP.        Celene Kras, MD 12/30/12 [phone]

## 2012-12-29 NOTE — Telephone Encounter (Signed)
Patient Information:  Caller Name: Sidonie  Phone: [phone]  Patient: Sandra Ferrell  Gender: Female  DOB: [birth year]  Age: 52 Years  PCP: Kerby Nora (Family Practice)  Pregnant: No  Office Follow Up:  Does the office need to follow up with this patient?: Yes  Instructions For The Office: Appts are full. Please call pt and advise.   Symptoms  Reason For Call & Symptoms: Pt has Type 2 diabetes. She takes Actose; Glypizide and Meformin. Pt states her blood sugar is going up and down over the past week. Last ngith her blood sugar was 377 - 382. Pt is eating minimal amounts of food. Her fasting BS is 287. Pt has had a cough x 3 months. Pt also has a vaginal yeast infection that has not been helped with 2 - 7 day treatments of otc medicine. Pt is very thristy but otherwise denies Nausea vomting or lethargy.  Reviewed Health History In EMR: Yes  Reviewed Medications In EMR: Yes  Reviewed Allergies In EMR: Yes  Reviewed Surgeries / Procedures: Yes  Date of Onset of Symptoms: 12/22/2012 OB / GYN:  LMP: Unknown  Guideline(s) Used:  Diabetes - High Blood Sugar  Disposition Per Guideline:   See Today in Office  Reason For Disposition Reached:   Patient wants to be seen  Advice Given:  N/A

## 2012-12-29 NOTE — Telephone Encounter (Signed)
Patient advised.

## 2012-12-30 ENCOUNTER — Ambulatory Visit: Payer: Medicare Other | Admitting: Family Medicine

## 2012-12-30 ENCOUNTER — Ambulatory Visit (INDEPENDENT_AMBULATORY_CARE_PROVIDER_SITE_OTHER): Payer: Medicare Other | Admitting: Family Medicine

## 2012-12-30 ENCOUNTER — Encounter: Payer: Self-pay | Admitting: Family Medicine

## 2012-12-30 VITALS — BP 130/78 | HR 104 | Temp 97.9°F | Ht 60.0 in | Wt 221.8 lb

## 2012-12-30 DIAGNOSIS — N390 Urinary tract infection, site not specified: Secondary | ICD-10-CM

## 2012-12-30 DIAGNOSIS — E119 Type 2 diabetes mellitus without complications: Secondary | ICD-10-CM

## 2012-12-30 LAB — URINE MICROSCOPIC-ADD ON

## 2012-12-30 LAB — CBC WITH DIFFERENTIAL/PLATELET
Basophils Absolute: 0 10*3/uL (ref 0.0–0.1)
Eosinophils Absolute: 0.2 10*3/uL (ref 0.0–0.7)
Eosinophils Relative: 2 % (ref 0–5)
MCH: 30.4 pg (ref 26.0–34.0)
MCV: 89.9 fL (ref 78.0–100.0)
Monocytes Absolute: 0.8 10*3/uL (ref 0.1–1.0)
Platelets: 378 10*3/uL (ref 150–400)
RDW: 13 % (ref 11.5–15.5)

## 2012-12-30 LAB — URINALYSIS, ROUTINE W REFLEX MICROSCOPIC
Ketones, ur: NEGATIVE mg/dL
Protein, ur: NEGATIVE mg/dL
Urobilinogen, UA: 0.2 mg/dL (ref 0.0–1.0)

## 2012-12-30 LAB — BASIC METABOLIC PANEL
Calcium: 9.7 mg/dL (ref 8.4–10.5)
Creatinine, Ser: 0.6 mg/dL (ref 0.50–1.10)
GFR calc non Af Amer: >90 mL/min (ref >90)
Glucose, Bld: 354 mg/dL — ABNORMAL HIGH (ref 70–99)
Sodium: 132 mEq/L — ABNORMAL LOW (ref 135–145)

## 2012-12-30 MED ORDER — CIPROFLOXACIN HCL 500 MG PO TABS: 500.0000 mg | ORAL_TABLET | Freq: Two times a day (BID) | ORAL | Status: DC

## 2012-12-30 MED ORDER — CIPROFLOXACIN HCL 500 MG PO TABS
500.0000 mg | ORAL_TABLET | Freq: Once | ORAL | Status: AC
  Administered 2012-12-30: 500 mg via ORAL
  Filled 2012-12-30: qty 1

## 2012-12-30 MED ORDER — INSULIN REGULAR HUMAN 100 UNIT/ML IJ SOLN: 6.0000 [IU] | Freq: Once | INTRAMUSCULAR | Status: DC

## 2012-12-30 MED ORDER — PIOGLITAZONE HCL 15 MG PO TABS: 30.0000 mg | ORAL_TABLET | Freq: Every day | ORAL | Status: DC

## 2012-12-30 MED ORDER — INSULIN ASPART 100 UNIT/ML ~~LOC~~ SOLN
6.0000 [IU] | Freq: Once | SUBCUTANEOUS | Status: AC
  Administered 2012-12-30: 6 [IU] via SUBCUTANEOUS
  Filled 2012-12-30: qty 1

## 2012-12-30 MED ORDER — FLUCONAZOLE 200 MG PO TABS: 200.0000 mg | ORAL_TABLET | Freq: Every day | ORAL | Status: AC

## 2012-12-30 NOTE — Assessment & Plan Note (Signed)
Complete antibiotics. Push fluids.  Could be contributing to elevated blood sugars.

## 2012-12-30 NOTE — Progress Notes (Signed)
  Subjective:    Patient ID: Sandra Ferrell, female    DOB: Apr 08, 1961, 52 y.o.   MRN: [id number]  HPI  52 year old female presents with elevated blood sugars.  BS have been running 355-425 in last 2 weeks.  She went to [facility] ER last night.  She was diagnosed with UTI.. Started Cipro. Given insulin.  Dry mouth. Fatigue. Urine odor. No frequency.  Last A1C was poor at last check:  Due to steroid injections. Lab Results  Component Value Date   HGBA1C 11.8* 11/17/2012   On  metformin   glipizide  actos  She is interested in trying byetta, victoza.  She is tired of taking multiple pills.  Diet: moderate, interested in referral to nutrition. Interested in starting [employer].   Review of Systems  Constitutional: Negative for fever and fatigue.  HENT: Negative for ear pain.   Eyes: Negative for pain.  Respiratory: Negative for chest tightness and shortness of breath.   Cardiovascular: Negative for chest pain, palpitations and leg swelling.  Gastrointestinal: Negative for abdominal pain.  Genitourinary: Negative for dysuria.       Objective:   Physical Exam  Constitutional: Vital signs are normal. She appears well-developed and well-nourished. She is cooperative.  Non-toxic appearance. She does not appear ill. No distress.  obese  HENT:  Head: Normocephalic.  Right Ear: Hearing, tympanic membrane, external ear and ear canal normal. Tympanic membrane is not erythematous, not retracted and not bulging.  Left Ear: Hearing, tympanic membrane, external ear and ear canal normal. Tympanic membrane is not erythematous, not retracted and not bulging.  Nose: No mucosal edema or rhinorrhea. Right sinus exhibits no maxillary sinus tenderness and no frontal sinus tenderness. Left sinus exhibits no maxillary sinus tenderness and no frontal sinus tenderness.  Mouth/Throat: Uvula is midline, oropharynx is clear and moist and mucous membranes are normal.  Eyes: Conjunctivae, EOM and lids are normal.  Pupils are equal, round, and reactive to light. No foreign bodies found.  Neck: Trachea normal and normal range of motion. Neck supple. Carotid bruit is not present. No mass and no thyromegaly present.  Cardiovascular: Normal rate, regular rhythm, S1 normal, S2 normal, normal heart sounds, intact distal pulses and normal pulses.  Exam reveals no gallop and no friction rub.   No murmur heard. Pulmonary/Chest: Effort normal and breath sounds normal. Not tachypneic. No respiratory distress. She has no decreased breath sounds. She has no wheezes. She has no rhonchi. She has no rales.  Abdominal: Soft. Normal appearance and bowel sounds are normal. There is no tenderness.  Neurological: She is alert.  Skin: Skin is warm, dry and intact. No rash noted.  Psychiatric: Her speech is normal and behavior is normal. Judgment and thought content normal. Her mood appears not anxious. Cognition and memory are normal. She does not exhibit a depressed mood.          Assessment & Plan:

## 2012-12-30 NOTE — Assessment & Plan Note (Signed)
Nutrition referral. Start exercise. Increase actos.. Look into coverage of victoza... If covered will stop glipizide.  For now increase actos and continue other meds.

## 2012-12-30 NOTE — Patient Instructions (Addendum)
Stop at front desk for nutrition referral. Work on 3 -5 days a week exercsie.Lyman Bishop sneakers. Work on weight loss. Look into Byetta or Victoza coverage. Call me to let me know what is covered. Can also look into Lantus or levemir  (insulin) coverage. For now increase actos to 30 mg daily, continue glipizide and metformin. Follow blood sugars fasting in AM. Complete cipro for urinary medication.

## 2012-12-31 LAB — URINE CULTURE: Colony Count: >=100000

## 2013-01-01 ENCOUNTER — Telehealth (HOSPITAL_COMMUNITY): Payer: Self-pay | Admitting: Emergency Medicine

## 2013-01-01 NOTE — ED Notes (Incomplete)
Positive urnc

## 2013-01-02 ENCOUNTER — Encounter: Payer: Medicare Other | Attending: Family Medicine | Admitting: Dietician

## 2013-01-05 ENCOUNTER — Telehealth: Payer: Self-pay | Admitting: Family Medicine

## 2013-01-05 NOTE — Telephone Encounter (Signed)
Patient Information:  Caller Name: Joelyn  Phone: [phone]  Patient: Sandra Ferrell  Gender: Female  DOB: 24-Mar-1961  Age: 52 Years  PCP: Kerby Nora (Family Practice)  Pregnant: No  Office Follow Up:  Does the office need to follow up with this patient?: Yes  Instructions For The Office: ONGOING BLOOD SUGAR ELEVATIONS  RN Note:  Patient is very concerned about her blood glucose. She has been seen in the office AND ED for recent blood sugar elevations.  She is not having an UTI symptoms per patient. please contact.  Symptoms  Reason For Call & Symptoms: Patient states her blood sugar is remaining elevated since receiving steroids shots in her back in November 2013.  Yesterday, 01/04/13 it was #455 and today it is  #252.  She is currently Metformin 2,000mg  daily and Glybizide 10 mg bid., along with  Actos 15mg  daily  Reviewed Health History In EMR: Yes  Reviewed Medications In EMR: Yes  Reviewed Allergies In EMR: Yes  Reviewed Surgeries / Procedures: No  Date of Onset of Symptoms: 09/05/2012  Treatments Tried: Diabetic medication, Diabetic diet -Lost 20lbs in 4 months  Treatments Tried Worked: No OB / GYN:  LMP: Unknown  Guideline(s) Used:  Diabetes - High Blood Sugar  Disposition Per Guideline:   Home Care  Reason For Disposition Reached:   Blood glucose > 240 mg/dl (13 mmol/l)  Advice Given:  Treatment - Liquids  Drink at least one glass (8 oz or 240 ml) of water per hour for the next 4 hours. (Reason: adequate hydration will reduce hyperglycemia).  Generally, you should try to drink 6-8 glasses of water each day.  Treatment - Diabetes Medications  : Continue taking your diabetes pills.  Measure and Record Your Blood Glucose  Every day you should measure your blood glucose before breakfast and before going to bed.  Record the results and show them to your doctor at your next office visit.  Call Back If:  Blood glucose more than 300 mg/dL (09.8 mmol/l), 2 or  more times in a row.  Vomiting lasting more than 4 hours or unable to drink any liquids.  Rapid breathing occurs  You become worse.

## 2013-01-06 MED ORDER — PIOGLITAZONE HCL 45 MG PO TABS: 45.0000 mg | ORAL_TABLET | Freq: Every day | ORAL | Status: DC

## 2013-01-06 NOTE — Telephone Encounter (Signed)
Increase actos to 45 mg daily. Send in prescription and change on med list. If not improving we will likely have to have her make and appt to change to insulin.

## 2013-01-06 NOTE — Telephone Encounter (Signed)
Patient advised and medication sent to pharmacy  

## 2013-01-14 ENCOUNTER — Other Ambulatory Visit: Payer: Medicare Other

## 2013-01-15 ENCOUNTER — Other Ambulatory Visit: Payer: Self-pay | Admitting: Family Medicine

## 2013-01-16 ENCOUNTER — Telehealth: Payer: Self-pay | Admitting: Family Medicine

## 2013-01-16 NOTE — Telephone Encounter (Signed)
Caller: Sandra Ferrell/Patient; Phone: [phone]; Reason for Call: Pt calling today 01/16/13 regarding refills for her Ibuprofen 800mg , Alprazolam 0.  5 mg and also Cyclobenzaprine 10 mg.  Said she called in the refill request to the pharmacy but she always calls office to make sure they received the request.  Advised pt according to chart these were called in to her pharmacy today at 9:47 AM (Per note in chart from Benny Lennert, CMA).   Pt verbalized understanding.  She will check with pharmacy, she has not called pharmacy today to see if meds were there yet.

## 2013-01-16 NOTE — Telephone Encounter (Signed)
rx called to pharmacy 

## 2013-01-19 ENCOUNTER — Other Ambulatory Visit: Payer: Self-pay | Admitting: *Deleted

## 2013-01-19 MED ORDER — GLIPIZIDE 10 MG PO TABS: 10.0000 mg | ORAL_TABLET | Freq: Two times a day (BID) | ORAL | Status: DC

## 2013-01-22 ENCOUNTER — Other Ambulatory Visit: Payer: Medicare Other

## 2013-02-05 ENCOUNTER — Telehealth: Payer: Self-pay | Admitting: Family Medicine

## 2013-02-05 NOTE — Telephone Encounter (Signed)
Noted  

## 2013-02-05 NOTE — Telephone Encounter (Signed)
Patient Information:  Caller Name: Sandra Ferrell  Phone: [phone]  Patient: Sandra Ferrell  Gender: Female  DOB: Aug 24, 1961  Age: 52 Years  PCP: Kerby Nora (Family Practice)  Pregnant: No  Office Follow Up:  Does the office need to follow up with this patient?: No  Instructions For The Office: N/A  RN Note:  Caller wanted Dr. Lelon Frohlich to know about steroid injection and glucose  Symptoms  Reason For Call & Symptoms: Pt received a steroid injection in her heel on 02/04/13.  Blood sugar in in the 300's.  Pt drinking water to bring glucose levels down. Pt having some diarrhea which she states she gets after a steroid injection.  Reviewed Health History In EMR: Yes  Reviewed Medications In EMR: Yes  Reviewed Allergies In EMR: Yes  Reviewed Surgeries / Procedures: Yes  Date of Onset of Symptoms: 02/05/2013  Treatments Tried: Crackers Jello  Treatments Tried Worked: No OB / GYN:  LMP: Unknown  Guideline(s) Used:  Diabetes - High Blood Sugar  Disposition Per Guideline:   Home Care  Reason For Disposition Reached:   Blood glucose > 240 mg/dl (13 mmol/l)  Advice Given:  Treatment - Liquids  Drink at least one glass (8 oz or 240 ml) of water per hour for the next 4 hours. (Reason: adequate hydration will reduce hyperglycemia).  Generally, you should try to drink 6-8 glasses of water each day.  Daily Blood Glucose Goals  Pre-prandial (before meal): 70-130 mg/dL (0.9-8.1 mmol/l)  Post-prandial (2-3 hours after a meal): Less than 180 mg/dL (10 mmol/l)  Call Back If:  Blood glucose more than 300 mg/dL (19.1 mmol/l), 2 or more times in a row.  Urine ketones become moderate or large  Vomiting lasting more than 4 hours or unable to drink any liquids.  Rapid breathing occurs  You become worse.  Patient Will Follow Care Advice:  YES

## 2013-02-12 ENCOUNTER — Other Ambulatory Visit: Payer: Self-pay | Admitting: Family Medicine

## 2013-02-12 NOTE — Telephone Encounter (Signed)
Patient Information:  Caller Name: Geriann  Phone: [phone]  Patient: Sandra Ferrell  Gender: Female  DOB: November 23, 1960  Age: 52 Years  PCP: Kerby Nora (Family Practice)  Pregnant: No  Office Follow Up:  Does the office need to follow up with this patient?: Yes  Instructions For The Office: Please check on refill status of medications.  Thank you.  RN Note:  Note to office for follow up.  Symptoms  Reason For Call & Symptoms: Patient calling regarding medication refills. States they get mixed up frequentlys o she calls pharmacy and office to verify they are handled correctly. Requests Ibuprofen 800 mg, Alprazolam 0.5 mg,  and Cyclobenzaprine 10 mg.  States she called early due to upcoming weekend.  [facility] - Pharmacy verified in Epic.  Reviewed Health History In EMR: Yes  Reviewed Medications In EMR: Yes  Reviewed Allergies In EMR: Yes  Reviewed Surgeries / Procedures: Yes  Date of Onset of Symptoms: Unknown OB / GYN:  LMP: Unknown  Guideline(s) Used:  No Protocol Available - Information Only  Disposition Per Guideline:   Discuss with PCP and Callback by Nurse Today  Reason For Disposition Reached:   Nursing judgment  Advice Given:  Call Back If:  New symptoms develop  You become worse.  Patient Will Follow Care Advice:  YES

## 2013-02-17 ENCOUNTER — Other Ambulatory Visit: Payer: Medicare Other

## 2013-02-24 ENCOUNTER — Ambulatory Visit: Payer: Medicare Other | Admitting: Family Medicine

## 2013-03-03 ENCOUNTER — Other Ambulatory Visit: Payer: Medicare Other

## 2013-03-04 ENCOUNTER — Other Ambulatory Visit: Payer: Medicare Other

## 2013-03-05 ENCOUNTER — Other Ambulatory Visit: Payer: Medicare Other

## 2013-03-05 ENCOUNTER — Other Ambulatory Visit: Payer: Self-pay | Admitting: Family Medicine

## 2013-03-05 NOTE — Telephone Encounter (Signed)
Ok to refill 

## 2013-03-05 NOTE — Telephone Encounter (Signed)
Sandra Ferrell/Patient; Phone: [phone]; Reason for Call: Refill of Tramadol, last filled 2/14, at [facility].

## 2013-03-10 ENCOUNTER — Telehealth: Payer: Self-pay | Admitting: Family Medicine

## 2013-03-10 ENCOUNTER — Other Ambulatory Visit: Payer: Self-pay | Admitting: Family Medicine

## 2013-03-10 ENCOUNTER — Other Ambulatory Visit (INDEPENDENT_AMBULATORY_CARE_PROVIDER_SITE_OTHER): Payer: Medicare Other

## 2013-03-10 ENCOUNTER — Ambulatory Visit: Payer: Medicare Other | Admitting: Family Medicine

## 2013-03-10 DIAGNOSIS — E119 Type 2 diabetes mellitus without complications: Secondary | ICD-10-CM

## 2013-03-10 DIAGNOSIS — I10 Essential (primary) hypertension: Secondary | ICD-10-CM

## 2013-03-10 DIAGNOSIS — E785 Hyperlipidemia, unspecified: Secondary | ICD-10-CM

## 2013-03-10 LAB — LIPID PANEL
Cholesterol: 140 mg/dL (ref 0–200)
HDL: 40.5 mg/dL (ref >39.00)
LDL Cholesterol: 68 mg/dL (ref 0–99)
Total CHOL/HDL Ratio: 3
Triglycerides: 159 mg/dL — ABNORMAL HIGH (ref 0.0–149.0)
VLDL: 31.8 mg/dL (ref 0.0–40.0)

## 2013-03-10 NOTE — Telephone Encounter (Signed)
Caller: Shawnetta/Patient; Phone: [phone]; Reason for Call: Patient calling in follow up regarding three Rx refills she had requested from the pharmacy.  Rx involved are alprazolam 0.  5mg  tabs, 1 po TID, #90; last RX written for this 02/12/13.  Second Rx is for flexeril 10mg , 1 po BID, #60; last Rx written for this was 02/12/13.  Third Rx is for ibuprofen 1 tab q 8 h prn pain; last Rx written for this was 03/10/13.  Patient advised ibuprofen Rx is complete.  States she has appt with Dr.  Ermalene Searing 03/13/13 but her Rx is due 03/14/13 which is a Saturday, so she is calling "plenty early; " advised Rx will be ready for her during her visit Friday 03/13/13.  Info to office for staff/provider review/follow up.  Patient states she will come to office visit 03/13/13; may reach patient at [phone].  Krs/can

## 2013-03-10 NOTE — Telephone Encounter (Signed)
rx called to pharmacy 

## 2013-03-10 NOTE — Telephone Encounter (Signed)
Cpompleted as requested.

## 2013-03-13 ENCOUNTER — Ambulatory Visit (INDEPENDENT_AMBULATORY_CARE_PROVIDER_SITE_OTHER): Payer: Medicare Other | Admitting: Family Medicine

## 2013-03-13 ENCOUNTER — Encounter: Payer: Self-pay | Admitting: Family Medicine

## 2013-03-13 VITALS — BP 140/72 | HR 99 | Temp 97.9°F | Ht 60.0 in | Wt 232.5 lb

## 2013-03-13 DIAGNOSIS — I10 Essential (primary) hypertension: Secondary | ICD-10-CM

## 2013-03-13 DIAGNOSIS — E119 Type 2 diabetes mellitus without complications: Secondary | ICD-10-CM

## 2013-03-13 DIAGNOSIS — J069 Acute upper respiratory infection, unspecified: Secondary | ICD-10-CM

## 2013-03-13 DIAGNOSIS — E785 Hyperlipidemia, unspecified: Secondary | ICD-10-CM

## 2013-03-13 NOTE — Patient Instructions (Addendum)
Let me know if you want to try Victoza. Continue actos and glipizide at current doses.  Work on exercise, weight loss, healthy eating habits.  Check blood pressure at home daily.. Goal <130/80... Call if >3 measurements >that goal. Continue Tussin DM for cough and congestion.. Call if not continuing to improve.  Due for eye exam!  Follow up in 3 months with labs prior.

## 2013-03-13 NOTE — Assessment & Plan Note (Signed)
Goog control, LDL at goal <100 on current regimen.

## 2013-03-13 NOTE — Progress Notes (Signed)
Subjective:    Patient ID: Sandra Ferrell, female    DOB: [birth year], 52 y.o.   MRN: [id number]  HPI  52 year old female presents for 3 month DM follow up.  Diabetes:  Improved control with increase in actos (max) and on max glipizide. She did receive another steroid injection in Right foot in 02/2013. Lab Results  Component Value Date   HGBA1C 9.9* 03/10/2013  Using medications without difficulties:None Hypoglycemic episodes:None Hyperglycemic episodes:None Feet problems:yes Blood Sugars averaging: FBS 130-155, 2 hour postprandial  160 eye exam within last year: Wt Readings from Last 3 Encounters:  03/13/13 232 lb 8 oz (105.461 kg)  12/30/12 221 lb 12 oz (100.585 kg)  12/29/12 215 lb (97.523 kg)  She has been drinking a lot of water.   Hypertension:  Above goal today in office on no medication. Using medication without problems or lightheadedness: None Chest pain with exertion:None Edema:None Short of breath:None Average home BPs: not checking but can Other issues:  Elevated Cholesterol: Good control  LDL and trigs on gemfibrozil, fish oils. Lab Results  Component Value Date   CHOL 140 03/10/2013   HDL 40.50 03/10/2013   LDLCALC 68 03/10/2013   LDLDIRECT 59.4 09/26/2009   TRIG 159.0* 03/10/2013   CHOLHDL 3 03/10/2013  Using medications without problems:none Muscle aches: None Diet compliance: Good Exercise: Walking Other complaints:   1 week of congestion, rhinorhea and cough. No fever. No wheeze, no SOB.  no ear pain, no sinus pain.   Has improved with Tussin DM.    Review of Systems  Constitutional: Negative for fever and fatigue.  HENT: Positive for congestion. Negative for ear pain, sneezing and ear discharge.   Eyes: Negative for pain and redness.  Respiratory: Positive for cough. Negative for chest tightness and shortness of breath.   Cardiovascular: Negative for chest pain, palpitations and leg swelling.  Gastrointestinal: Negative for abdominal pain.   Genitourinary: Negative for dysuria.       Objective:   Physical Exam  Constitutional: Vital signs are normal. She appears well-developed and well-nourished. She is cooperative.  Non-toxic appearance. She does not appear ill. No distress.  HENT:  Head: Normocephalic.  Right Ear: Hearing, tympanic membrane, external ear and ear canal normal. Tympanic membrane is not erythematous, not retracted and not bulging.  Left Ear: Hearing, tympanic membrane, external ear and ear canal normal. Tympanic membrane is not erythematous, not retracted and not bulging.  Nose: No mucosal edema or rhinorrhea. Right sinus exhibits no maxillary sinus tenderness and no frontal sinus tenderness. Left sinus exhibits no maxillary sinus tenderness and no frontal sinus tenderness.  Mouth/Throat: Uvula is midline, oropharynx is clear and moist and mucous membranes are normal.  Eyes: Conjunctivae, EOM and lids are normal. Pupils are equal, round, and reactive to light. No foreign bodies found.  Neck: Trachea normal and normal range of motion. Neck supple. Carotid bruit is not present. No mass and no thyromegaly present.  Cardiovascular: Normal rate, regular rhythm, S1 normal, S2 normal, normal heart sounds, intact distal pulses and normal pulses.  Exam reveals no gallop and no friction rub.   No murmur heard. Pulmonary/Chest: Effort normal and breath sounds normal. Not tachypneic. No respiratory distress. She has no decreased breath sounds. She has no wheezes. She has no rhonchi. She has no rales.  Abdominal: Soft. Normal appearance and bowel sounds are normal. There is no tenderness.  Neurological: She is alert.  Skin: Skin is warm, dry and intact. No rash noted.  Psychiatric:  Her speech is normal and behavior is normal. Judgment and thought content normal. Her mood appears not anxious. Cognition and memory are normal. She does not exhibit a depressed mood.   Diabetic foot exam: Normal inspection No skin breakdown No  calluses  Normal DP pulses Normal sensation to light touch and monofilament Nails normal         Assessment & Plan:

## 2013-03-13 NOTE — Assessment & Plan Note (Signed)
Well controlled. Continue current medication.  

## 2013-03-13 NOTE — Assessment & Plan Note (Signed)
Improving control .... She will look into victoza coverage. This would be a great option for her if covered. At this poiint return in 3 months for recheck A1C. Encouraged exercise, weight loss, healthy eating habits.

## 2013-04-05 ENCOUNTER — Other Ambulatory Visit: Payer: Self-pay | Admitting: Family Medicine

## 2013-04-06 ENCOUNTER — Telehealth: Payer: Self-pay | Admitting: Family Medicine

## 2013-04-06 NOTE — Telephone Encounter (Signed)
Patient advised she will have to wait until dr Ermalene Searing approves

## 2013-04-06 NOTE — Telephone Encounter (Signed)
discussed

## 2013-04-06 NOTE — Telephone Encounter (Signed)
Refill received today and Dr. Ermalene Searing out of office will route to [place]

## 2013-04-06 NOTE — Telephone Encounter (Signed)
Massa went to pharmacy and refills for ALPRAZolam Prudy Feeler) 0.5 MG tablet,  cyclobenzaprine (FLEXERIL) 10 MG tablet and Ibuprofen were not received by pharmacy.   Instructed Caller Ibuprofen has a refill available.   Thank you.

## 2013-04-07 NOTE — Telephone Encounter (Signed)
Call regarding: Medication Refills for Ibuprofen, Xanax and Flexeril; says the pharmacy has sent over several requests and said she called 6/2 and was told Dr.Bedsole was out of the office; checked epic and found rxs sent over 6/1 for Xanax and Flexeril; said Ibuprofen still had one refill on it from last month; [facility]

## 2013-04-07 NOTE — Telephone Encounter (Signed)
RX CALLED TO PHARMACY  

## 2013-04-13 ENCOUNTER — Telehealth: Payer: Self-pay

## 2013-04-13 NOTE — Telephone Encounter (Signed)
[facility] faxed prior auth cyclobenzaprine; talking with pt about previous tried and failed meds; pt said has never tried another med for muscle and back pain except cyclobenzaprine; pt is presently paying $ 16.00 out of pocket for medication.(not going thru ins.). Pt just wants to continue paying out of pocket, does not want to try any other meds. Natalie at [facility] [employer] notified.

## 2013-05-04 ENCOUNTER — Other Ambulatory Visit: Payer: Self-pay | Admitting: Family Medicine

## 2013-05-04 NOTE — Telephone Encounter (Signed)
Pt left v/m requesting refill to [facility]; request alprazolam early due to holiday.Please advise.

## 2013-05-04 NOTE — Telephone Encounter (Signed)
rx called to pharmacy 

## 2013-05-05 NOTE — Telephone Encounter (Signed)
[facility] said [facility]08/13; pt going out of town 05/07/13. Spoke with Angelica Chessman at [facility] and pt can pick up med on 05/07/13 instead of 05/08/13. Pt notified.

## 2013-05-11 ENCOUNTER — Other Ambulatory Visit: Payer: Self-pay | Admitting: Family Medicine

## 2013-05-27 ENCOUNTER — Other Ambulatory Visit: Payer: Self-pay | Admitting: Family Medicine

## 2013-06-01 ENCOUNTER — Other Ambulatory Visit: Payer: Self-pay | Admitting: Family Medicine

## 2013-06-02 NOTE — Telephone Encounter (Signed)
Pt request status of cyclobenzaprine refill; spoke with [facility] rx ready for pick up; pt notified.

## 2013-06-05 ENCOUNTER — Other Ambulatory Visit: Payer: Self-pay | Admitting: Family Medicine

## 2013-06-05 NOTE — Telephone Encounter (Signed)
Pt left v/m requesting refill on alprazolam to [facility].Please advise.

## 2013-06-05 NOTE — Telephone Encounter (Signed)
rx called into pharmacy

## 2013-06-08 ENCOUNTER — Encounter: Payer: Self-pay | Admitting: Radiology

## 2013-06-09 ENCOUNTER — Telehealth: Payer: Self-pay | Admitting: Family Medicine

## 2013-06-09 ENCOUNTER — Other Ambulatory Visit: Payer: Medicare Other

## 2013-06-09 ENCOUNTER — Encounter: Payer: Self-pay | Admitting: Radiology

## 2013-06-09 DIAGNOSIS — E785 Hyperlipidemia, unspecified: Secondary | ICD-10-CM

## 2013-06-09 DIAGNOSIS — E119 Type 2 diabetes mellitus without complications: Secondary | ICD-10-CM

## 2013-06-09 NOTE — Telephone Encounter (Signed)
Message copied by Excell Seltzer on Tue Jun 09, 2013  8:22 AM ------      Message from: Alvina Chou      Created: Tue May 26, 2013 11:39 AM      Regarding: Lab orders for Tuesday, 8.5.14       Lab orders for a 3 month f/u ------

## 2013-06-10 ENCOUNTER — Encounter: Payer: Self-pay | Admitting: Radiology

## 2013-06-10 ENCOUNTER — Other Ambulatory Visit: Payer: Medicare Other

## 2013-06-11 ENCOUNTER — Other Ambulatory Visit (INDEPENDENT_AMBULATORY_CARE_PROVIDER_SITE_OTHER): Payer: Medicare Other

## 2013-06-11 DIAGNOSIS — E119 Type 2 diabetes mellitus without complications: Secondary | ICD-10-CM

## 2013-06-11 DIAGNOSIS — E785 Hyperlipidemia, unspecified: Secondary | ICD-10-CM

## 2013-06-11 LAB — LIPID PANEL
Cholesterol: 147 mg/dL (ref 0–200)
VLDL: 22.8 mg/dL (ref 0.0–40.0)

## 2013-06-11 LAB — COMPREHENSIVE METABOLIC PANEL
Albumin: 3.5 g/dL (ref 3.5–5.2)
CO2: 26 mEq/L (ref 19–32)
Calcium: 9.5 mg/dL (ref 8.4–10.5)
Chloride: 101 mEq/L (ref 96–112)
GFR: 80.89 mL/min (ref >60.00)
Glucose, Bld: 221 mg/dL — ABNORMAL HIGH (ref 70–99)
Sodium: 135 mEq/L (ref 135–145)
Total Bilirubin: 0.4 mg/dL (ref 0.3–1.2)
Total Protein: 7.1 g/dL (ref 6.0–8.3)

## 2013-06-11 LAB — HEMOGLOBIN A1C: Hgb A1c MFr Bld: 8.9 % — ABNORMAL HIGH (ref 4.6–6.5)

## 2013-06-16 ENCOUNTER — Ambulatory Visit: Payer: Medicare Other | Admitting: Family Medicine

## 2013-06-17 ENCOUNTER — Encounter: Payer: Self-pay | Admitting: Family Medicine

## 2013-06-17 ENCOUNTER — Ambulatory Visit (INDEPENDENT_AMBULATORY_CARE_PROVIDER_SITE_OTHER): Payer: Medicare Other | Admitting: Family Medicine

## 2013-06-17 VITALS — BP 148/78 | HR 60 | Temp 97.9°F | Ht 60.0 in | Wt 232.0 lb

## 2013-06-17 DIAGNOSIS — E785 Hyperlipidemia, unspecified: Secondary | ICD-10-CM

## 2013-06-17 DIAGNOSIS — E119 Type 2 diabetes mellitus without complications: Secondary | ICD-10-CM

## 2013-06-17 DIAGNOSIS — I10 Essential (primary) hypertension: Secondary | ICD-10-CM

## 2013-06-17 DIAGNOSIS — J209 Acute bronchitis, unspecified: Secondary | ICD-10-CM

## 2013-06-17 DIAGNOSIS — J189 Pneumonia, unspecified organism: Secondary | ICD-10-CM | POA: Insufficient documentation

## 2013-06-17 LAB — HM DIABETES FOOT EXAM

## 2013-06-17 MED ORDER — AZITHROMYCIN 250 MG PO TABS: ORAL_TABLET | ORAL | Status: DC

## 2013-06-17 NOTE — Assessment & Plan Note (Signed)
>   2 weeks. Recommend antibiotics. Call if not improving

## 2013-06-17 NOTE — Assessment & Plan Note (Signed)
Well controlled. Continue current medication.  

## 2013-06-17 NOTE — Patient Instructions (Addendum)
Make sure to have yearly eye exam. Call me next week after you speak with the insurance lady about [place] or victoza injections versus januvia/onglyza oral medication. Follow up in 3 months, with labs prior. Complete antibiotics and call if not improving in next week. Got to ER if severe SOB.

## 2013-06-17 NOTE — Assessment & Plan Note (Signed)
Improving but still far from gaol. She is looking into victoza vs januvia options.  On max glipizide and actos.

## 2013-06-17 NOTE — Progress Notes (Signed)
52 year old female presents for 3 month DM follow up.   Diabetes: Improved control with increase in actos (max) and on max glipizide.  Lab Results  Component Value Date   HGBA1C 8.9* 06/11/2013  Using medications without difficulties:None  Hypoglycemic episodes:None  Hyperglycemic episodes:None  Feet problems:None Blood Sugars averaging: FBS 148-162, 2 hour postprandial 160 eye exam within last year: None  Wt Readings from Last 3 Encounters:  06/17/13 232 lb (105.235 kg)  03/13/13 232 lb 8 oz (105.461 kg)  12/30/12 221 lb 12 oz (100.585 kg)   She has been drinking a lot of water.   Hypertension: Above goal today in office on no medication.  Using medication without problems or lightheadedness: None  Chest pain with exertion:None  Edema:None  Short of breath:None  Average home BPs: not checking  Other issues:   Elevated Cholesterol: Good control LDL and trigs on gemfibrozil, fish oils.  Lab Results  Component Value Date   CHOL 147 06/11/2013   HDL 38.90* 06/11/2013   LDLCALC 85 06/11/2013   LDLDIRECT 59.4 09/26/2009   TRIG 114.0 06/11/2013   CHOLHDL 4 06/11/2013   Using medications without problems:none  Muscle aches: None  Diet compliance: Good  Exercise: Walking  Other complaints:   Dr. Modesto Charon for [facility]: considering numbing injections in back to help with pain.  2 week of congestion, rhinorhea and  Productive cough (lightbrown). No fever. Occ wheeze and SOB.  Inhaler helps some No ear pain, no sinus pain.   She continues tosmoke. ON symbicort inhaler for COPD. Ibuprofen helps some. Has improved with Tussin DM.    Review of Systems  Constitutional: Negative for fever and fatigue.  HENT: Positive for congestion. Negative for ear pain, sneezing and ear discharge.  Eyes: Negative for pain and redness.  Respiratory: Positive for cough. Negative for chest tightness and shortness of breath.  Cardiovascular: Negative for chest pain, palpitations and leg swelling.   Gastrointestinal: Negative for abdominal pain.  Genitourinary: Negative for dysuria.  Objective:   Physical Exam  Constitutional: Vital signs are normal. She appears well-developed and well-nourished. She is cooperative. Non-toxic appearance. She does not appear ill. No distress.  HENT:  Head: Normocephalic.  Right Ear: Hearing, tympanic membrane, external ear and ear canal normal. Tympanic membrane is not erythematous, not retracted and not bulging.  Left Ear: Hearing, tympanic membrane, external ear and ear canal normal. Tympanic membrane is not erythematous, not retracted and not bulging.  Nose: No mucosal edema or rhinorrhea. Right sinus exhibits no maxillary sinus tenderness and no frontal sinus tenderness. Left sinus exhibits no maxillary sinus tenderness and no frontal sinus tenderness.  Mouth/Throat: Uvula is midline, oropharynx is clear and moist and mucous membranes are normal.  Eyes: Conjunctivae, EOM and lids are normal. Pupils are equal, round, and reactive to light. No foreign bodies found.  Neck: Trachea normal and normal range of motion. Neck supple. Carotid bruit is not present. No mass and no thyromegaly present.  Cardiovascular: Normal rate, regular rhythm, S1 normal, S2 normal, normal heart sounds, intact distal pulses and normal pulses. Exam reveals no gallop and no friction rub.  No murmur heard.  Pulmonary/Chest: Effort normal and breath sounds normal. Not tachypneic. No respiratory distress. She has no decreased breath sounds. She has no wheezes. She has no rhonchi. She has no rales.  Abdominal: Soft. Normal appearance and bowel sounds are normal. There is no tenderness.  Neurological: She is alert.  Skin: Skin is warm, dry and intact. No rash  noted.  Psychiatric: Her speech is normal and behavior is normal. Judgment and thought content normal. Her mood appears not anxious. Cognition and memory are normal. She does not exhibit a depressed mood.   Diabetic foot exam:   Normal inspection  No skin breakdown  No calluses  Normal DP pulses  Normal sensation to light touch and monofilament  Nails normal

## 2013-06-19 ENCOUNTER — Encounter: Payer: Self-pay | Admitting: Family Medicine

## 2013-06-26 ENCOUNTER — Ambulatory Visit: Payer: Medicare Other | Admitting: Family Medicine

## 2013-06-28 ENCOUNTER — Emergency Department (HOSPITAL_COMMUNITY): Payer: Medicare Other

## 2013-06-28 ENCOUNTER — Emergency Department (HOSPITAL_COMMUNITY)
Admission: EM | Admit: 2013-06-28 | Discharge: 2013-06-28 | Disposition: A | Discharge status: Home / Self Care | Payer: Medicare Other | Attending: Emergency Medicine | Admitting: Emergency Medicine

## 2013-06-28 ENCOUNTER — Encounter (HOSPITAL_COMMUNITY): Payer: Self-pay | Admitting: Emergency Medicine

## 2013-06-28 DIAGNOSIS — M1990 Unspecified osteoarthritis, unspecified site: Secondary | ICD-10-CM | POA: Insufficient documentation

## 2013-06-28 DIAGNOSIS — Z9089 Acquired absence of other organs: Secondary | ICD-10-CM | POA: Insufficient documentation

## 2013-06-28 DIAGNOSIS — J3489 Other specified disorders of nose and nasal sinuses: Secondary | ICD-10-CM | POA: Insufficient documentation

## 2013-06-28 DIAGNOSIS — F17200 Nicotine dependence, unspecified, uncomplicated: Secondary | ICD-10-CM

## 2013-06-28 DIAGNOSIS — R630 Anorexia: Secondary | ICD-10-CM | POA: Insufficient documentation

## 2013-06-28 DIAGNOSIS — R0789 Other chest pain: Secondary | ICD-10-CM | POA: Insufficient documentation

## 2013-06-28 DIAGNOSIS — Z8719 Personal history of other diseases of the digestive system: Secondary | ICD-10-CM | POA: Insufficient documentation

## 2013-06-28 DIAGNOSIS — Z8709 Personal history of other diseases of the respiratory system: Secondary | ICD-10-CM | POA: Insufficient documentation

## 2013-06-28 DIAGNOSIS — R011 Cardiac murmur, unspecified: Secondary | ICD-10-CM | POA: Insufficient documentation

## 2013-06-28 DIAGNOSIS — J189 Pneumonia, unspecified organism: Secondary | ICD-10-CM

## 2013-06-28 DIAGNOSIS — J441 Chronic obstructive pulmonary disease with (acute) exacerbation: Secondary | ICD-10-CM | POA: Insufficient documentation

## 2013-06-28 DIAGNOSIS — Z7982 Long term (current) use of aspirin: Secondary | ICD-10-CM | POA: Insufficient documentation

## 2013-06-28 DIAGNOSIS — G8929 Other chronic pain: Secondary | ICD-10-CM | POA: Insufficient documentation

## 2013-06-28 DIAGNOSIS — IMO0001 Reserved for inherently not codable concepts without codable children: Secondary | ICD-10-CM | POA: Insufficient documentation

## 2013-06-28 DIAGNOSIS — I10 Essential (primary) hypertension: Secondary | ICD-10-CM | POA: Insufficient documentation

## 2013-06-28 DIAGNOSIS — R509 Fever, unspecified: Secondary | ICD-10-CM | POA: Insufficient documentation

## 2013-06-28 DIAGNOSIS — IMO0002 Reserved for concepts with insufficient information to code with codable children: Secondary | ICD-10-CM | POA: Insufficient documentation

## 2013-06-28 DIAGNOSIS — E119 Type 2 diabetes mellitus without complications: Secondary | ICD-10-CM | POA: Insufficient documentation

## 2013-06-28 DIAGNOSIS — F3289 Other specified depressive episodes: Secondary | ICD-10-CM | POA: Insufficient documentation

## 2013-06-28 DIAGNOSIS — Z87442 Personal history of urinary calculi: Secondary | ICD-10-CM | POA: Insufficient documentation

## 2013-06-28 DIAGNOSIS — F411 Generalized anxiety disorder: Secondary | ICD-10-CM | POA: Insufficient documentation

## 2013-06-28 DIAGNOSIS — M549 Dorsalgia, unspecified: Secondary | ICD-10-CM | POA: Insufficient documentation

## 2013-06-28 DIAGNOSIS — F329 Major depressive disorder, single episode, unspecified: Secondary | ICD-10-CM | POA: Insufficient documentation

## 2013-06-28 DIAGNOSIS — J159 Unspecified bacterial pneumonia: Secondary | ICD-10-CM | POA: Insufficient documentation

## 2013-06-28 DIAGNOSIS — J449 Chronic obstructive pulmonary disease, unspecified: Secondary | ICD-10-CM

## 2013-06-28 DIAGNOSIS — Z8659 Personal history of other mental and behavioral disorders: Secondary | ICD-10-CM | POA: Insufficient documentation

## 2013-06-28 DIAGNOSIS — Z880 Allergy status to penicillin: Secondary | ICD-10-CM | POA: Insufficient documentation

## 2013-06-28 DIAGNOSIS — R110 Nausea: Secondary | ICD-10-CM | POA: Insufficient documentation

## 2013-06-28 DIAGNOSIS — E785 Hyperlipidemia, unspecified: Secondary | ICD-10-CM | POA: Insufficient documentation

## 2013-06-28 DIAGNOSIS — Z87448 Personal history of other diseases of urinary system: Secondary | ICD-10-CM | POA: Insufficient documentation

## 2013-06-28 DIAGNOSIS — Z79899 Other long term (current) drug therapy: Secondary | ICD-10-CM | POA: Insufficient documentation

## 2013-06-28 LAB — BASIC METABOLIC PANEL
BUN: 18 mg/dL (ref 6–23)
CO2: 24 mEq/L (ref 19–32)
Chloride: 95 mEq/L — ABNORMAL LOW (ref 96–112)
Creatinine, Ser: 0.69 mg/dL (ref 0.50–1.10)
GFR calc Af Amer: >90 mL/min (ref >90)
Potassium: 4 mEq/L (ref 3.5–5.1)

## 2013-06-28 LAB — CBC WITH DIFFERENTIAL/PLATELET
HCT: 39.1 % (ref 36.0–46.0)
Hemoglobin: 13 g/dL (ref 12.0–15.0)
Lymphocytes Relative: 33 % (ref 12–46)
Lymphs Abs: 3.3 10*3/uL (ref 0.7–4.0)
Monocytes Absolute: 1 10*3/uL (ref 0.1–1.0)
Monocytes Relative: 10 % (ref 3–12)
Neutro Abs: 5.3 10*3/uL (ref 1.7–7.7)
Neutrophils Relative %: 53 % (ref 43–77)
RBC: 4.28 MIL/uL (ref 3.87–5.11)
WBC: 10 10*3/uL (ref 4.0–10.5)

## 2013-06-28 LAB — URINALYSIS, ROUTINE W REFLEX MICROSCOPIC
Bilirubin Urine: NEGATIVE
Glucose, UA: NEGATIVE mg/dL
Hgb urine dipstick: NEGATIVE
Specific Gravity, Urine: 1.017 (ref 1.005–1.030)
Urobilinogen, UA: 0.2 mg/dL (ref 0.0–1.0)
pH: 5.5 (ref 5.0–8.0)

## 2013-06-28 LAB — HEPATIC FUNCTION PANEL
ALT: 14 U/L (ref 0–35)
AST: 16 U/L (ref 0–37)
Bilirubin, Direct: <0.1 mg/dL (ref 0.0–0.3)

## 2013-06-28 MED ORDER — LEVOFLOXACIN IN D5W 750 MG/150ML IV SOLN
750.0000 mg | Freq: Once | INTRAVENOUS | Status: AC
  Administered 2013-06-28: 750 mg via INTRAVENOUS
  Filled 2013-06-28: qty 150

## 2013-06-28 MED ORDER — PREDNISONE 20 MG PO TABS
60.0000 mg | ORAL_TABLET | Freq: Once | ORAL | Status: AC
  Administered 2013-06-28: 60 mg via ORAL
  Filled 2013-06-28: qty 3

## 2013-06-28 MED ORDER — HYDROCODONE-ACETAMINOPHEN 5-325 MG PO TABS
2.0000 | ORAL_TABLET | Freq: Once | ORAL | Status: AC
  Administered 2013-06-28: 2 via ORAL
  Filled 2013-06-28: qty 2

## 2013-06-28 MED ORDER — SODIUM CHLORIDE 0.9 % IV BOLUS (SEPSIS)
1000.0000 mL | Freq: Once | INTRAVENOUS | Status: AC
  Administered 2013-06-28: 1000 mL via INTRAVENOUS

## 2013-06-28 MED ORDER — LEVOFLOXACIN 750 MG PO TABS: 750.0000 mg | ORAL_TABLET | Freq: Every day | ORAL | Status: DC

## 2013-06-28 MED ORDER — ACETAMINOPHEN-CODEINE #3 300-30 MG PO TABS: 1.0000 | ORAL_TABLET | Freq: Four times a day (QID) | ORAL | Status: DC | PRN

## 2013-06-28 MED ORDER — ALBUTEROL SULFATE (5 MG/ML) 0.5% IN NEBU
5.0000 mg | INHALATION_SOLUTION | Freq: Once | RESPIRATORY_TRACT | Status: AC
  Administered 2013-06-28: 5 mg via RESPIRATORY_TRACT
  Filled 2013-06-28: qty 1

## 2013-06-28 MED ORDER — PREDNISONE 20 MG PO TABS: 40.0000 mg | ORAL_TABLET | Freq: Every day | ORAL | Status: DC

## 2013-06-28 NOTE — ED Notes (Signed)
EDP Campos and EDPA Dahlia Client both in to see pt and family before discharge. Discharge plan, follow up and care discussed.

## 2013-06-28 NOTE — ED Provider Notes (Signed)
CSN: [id number]     Arrival date & time 06/28/13  1428 History     First MD Initiated Contact with Patient 06/28/13 1729     Chief Complaint  Patient presents with  . Cough   (Consider location/radiation/quality/duration/timing/severity/associated sxs/prior Treatment) Patient is a 52 y.o. female presenting with cough. The history is provided by the patient and medical records. No language interpreter was used.  Cough Associated symptoms: chills, fever, myalgias, shortness of breath and wheezing   Associated symptoms: no chest pain, no diaphoresis, no headaches and no rash     CARLISS PORCARO is a 52 y.o. female  with a hx of COPD, DM, GERD, fibromyalgia, HTN, GERD presents to the Emergency Department complaining of gradual, persistent, progressively worsening general illness with associated nasal congestion, rhinorrhea, ST, cough, chest congestion, general myalgias, nausea, decreased appetite, hyperglycemia, "low grade fever" onset 4 days ago. Pt was to see her PCP on Friday but didn't feel like going to the doctor.  Fever at home has been 100.0.  Pt has taken ibuprofen which makes her symptoms better and nothing makes it worse.  Pt denies headache, neck pain, chest pain, vomiting, diarrhea, dizziness, syncope, dysuria, hematuria.  Pt reports she saw the 2 weeks ago and was given an abx (azithromycin) and OTC cough syrup which has not helped. Pt has chronic back pain at baseline and she is receiving cortisone shots by Dr Modesto Charon. She will follow-up in 2 days with [facility].  Pt continues to smoke 1+ pack per day.     Past Medical History  Diagnosis Date  . Allergic rhinitis, cause unspecified   . Anxiety state, unspecified   . Extrinsic asthma with exacerbation   . Backache, unspecified   . Depressive disorder, not elsewhere classified   . Type II or unspecified type diabetes mellitus without mention of complication, not stated as uncontrolled   . Esophageal reflux   . Other and  unspecified hyperlipidemia   . Osteoarthrosis, unspecified whether generalized or localized, unspecified site   . Other screening mammogram   . Routine gynecological examination   . Unspecified sleep apnea   . Tobacco use disorder   . Personal history of unspecified urinary disorder   . Routine general medical examination at a health care facility   . Heart murmur   . Hypertension   . Calculus of kidney   . Fibromyalgia    Past Surgical History  Procedure Laterality Date  . Appendectomy  1973  . Cholecystectomy  08/2006  . Transthoracic echocardiogram  02/2011    mild LVH, nl EF, mild diastolic dysfunction, no wall motion abnl   Family History  Problem Relation Age of Onset  . Stroke Father   . Colon cancer Cousin    History  Substance Use Topics  . Smoking status: Current Every Day Smoker -- 1.00 packs/day for 35 years    Types: Cigarettes  . Smokeless tobacco: Never Used  . Alcohol Use: No   OB History   Grav Para Term Preterm Abortions TAB SAB Ect Mult Living                 Review of Systems  Constitutional: Positive for fever and chills. Negative for diaphoresis, appetite change, fatigue and unexpected weight change.  HENT: Negative for mouth sores and neck stiffness.   Eyes: Negative for visual disturbance.  Respiratory: Positive for cough, shortness of breath and wheezing. Negative for chest tightness.   Cardiovascular: Negative for chest pain.  Gastrointestinal: Positive  for nausea and abdominal pain (lower, mild). Negative for vomiting, diarrhea and constipation.  Endocrine: Negative for polydipsia, polyphagia and polyuria.  Genitourinary: Negative for dysuria, urgency, frequency and hematuria.  Musculoskeletal: Positive for myalgias. Negative for back pain.  Skin: Negative for rash.  Allergic/Immunologic: Negative for immunocompromised state.  Neurological: Negative for syncope, light-headedness and headaches.  Hematological: Does not bruise/bleed easily.   Psychiatric/Behavioral: Negative for sleep disturbance. The patient is not nervous/anxious.     Allergies  Benazepril; Aspirin; Diclofenac sodium; Fluticasone-salmeterol; Nsaids; and Penicillins  Home Medications   Current Outpatient Rx  Name  Route  Sig  Dispense  Refill  . albuterol (PROVENTIL HFA;VENTOLIN HFA) 108 (90 BASE) MCG/ACT inhaler      INHALE 2 PUFFS INTO THE LUNGS EVERY 4 (FOUR) HOURS AS NEEDED FOR WHEEZING.   8.5 each   4   . ALPRAZolam (XANAX) 0.5 MG tablet      One tab three times a day as needed.   90 tablet   0   . amitriptyline (ELAVIL) 50 MG tablet   Oral   Take 75 mg by mouth at bedtime.          Marland Kitchen aspirin EC 81 MG EC tablet   Oral   Take 1 tablet (81 mg total) by mouth daily.         . budesonide-formoterol (SYMBICORT) 80-4.5 MCG/ACT inhaler   Inhalation   Inhale 2 puffs into the lungs 2 (two) times daily.   1 Inhaler   11   . calcium elemental as carbonate (ANTACID ULTRA STRENGTH) 400 MG tablet   Oral   Chew 1,000 mg by mouth as needed. heartburn         . chlorpheniramine (CHLOR-TRIMETON) 4 MG tablet   Oral   Take 4 mg by mouth 2 (two) times daily as needed for allergies.         . cyclobenzaprine (FLEXERIL) 10 MG tablet   Oral   Take 10 mg by mouth 2 (two) times daily as needed for muscle spasms.         Marland Kitchen dextromethorphan (DELSYM) 30 MG/5ML liquid   Oral   Take 60 mg by mouth as needed for cough.         . fish oil-omega-3 fatty acids 1000 MG capsule   Oral   Take 1 g by mouth daily.           Marland Kitchen gemfibrozil (LOPID) 600 MG tablet   Oral   Take 600 mg by mouth 2 (two) times daily before a meal.         . glipiZIDE (GLUCOTROL) 10 MG tablet   Oral   Take 10 mg by mouth 2 (two) times daily before a meal.         . guaifenesin (ROBITUSSIN) 100 MG/5ML syrup   Oral   Take 200 mg by mouth 3 (three) times daily as needed for cough.         Marland Kitchen HYDROcodone-acetaminophen (NORCO) 5-325 MG per tablet   Oral   Take 1  tablet by mouth every 6 (six) hours as needed. For pain         . ibuprofen (ADVIL,MOTRIN) 800 MG tablet   Oral   Take 800 mg by mouth every 8 (eight) hours as needed for pain.         . metFORMIN (GLUCOPHAGE) 1000 MG tablet   Oral   Take 1,000 mg by mouth 2 (two) times daily with a meal.         .  pioglitazone (ACTOS) 45 MG tablet   Oral   Take 45 mg by mouth daily.         . traMADol (ULTRAM) 50 MG tablet   Oral   Take 100 mg by mouth every 8 (eight) hours as needed for pain.         Marland Kitchen acetaminophen-codeine (TYLENOL #3) 300-30 MG per tablet   Oral   Take 1 tablet by mouth every 6 (six) hours as needed for pain.   15 tablet   0   . benzocaine-resorcinol (VAGISIL) 5-2 % vaginal cream   Vaginal   Place 1 application vaginally at bedtime.         Marland Kitchen levofloxacin (LEVAQUIN) 750 MG tablet   Oral   Take 1 tablet (750 mg total) by mouth daily. X 7 days   7 tablet   0   . predniSONE (DELTASONE) 20 MG tablet   Oral   Take 2 tablets (40 mg total) by mouth daily.   10 tablet   0    BP 121/71  Pulse 82  Temp(Src) 97.4 F (36.3 C) (Oral)  Resp 17  SpO2 96% Physical Exam  Nursing note and vitals reviewed. Constitutional: She is oriented to person, place, and time. She appears well-developed and well-nourished. No distress.  Awake, alert, nontoxic appearance  HENT:  Head: Normocephalic and atraumatic.  Right Ear: Tympanic membrane, external ear and ear canal normal.  Left Ear: Tympanic membrane, external ear and ear canal normal.  Nose: Mucosal edema present. No rhinorrhea. No epistaxis. Right sinus exhibits no maxillary sinus tenderness and no frontal sinus tenderness. Left sinus exhibits no maxillary sinus tenderness and no frontal sinus tenderness.  Mouth/Throat: Uvula is midline, oropharynx is clear and moist and mucous membranes are normal. Mucous membranes are not pale and not cyanotic. No edematous. No oropharyngeal exudate, posterior oropharyngeal edema,  posterior oropharyngeal erythema or tonsillar abscesses.  Eyes: Conjunctivae and EOM are normal. Pupils are equal, round, and reactive to light. Right conjunctiva is not injected. Left conjunctiva is not injected. No scleral icterus. Right eye exhibits normal extraocular motion. Left eye exhibits normal extraocular motion.  Neck: Normal range of motion and full passive range of motion without pain. Neck supple. No spinous process tenderness and no muscular tenderness present. No rigidity. Normal range of motion present.  Cardiovascular: Normal rate, regular rhythm, S1 normal, S2 normal, normal heart sounds and intact distal pulses.   Pulses:      Radial pulses are 2+ on the right side, and 2+ on the left side.       Dorsalis pedis pulses are 2+ on the right side, and 2+ on the left side.       Posterior tibial pulses are 2+ on the right side, and 2+ on the left side.  Pulmonary/Chest: Effort normal. No stridor. No respiratory distress. She has decreased breath sounds (throughout). She has wheezes in the left middle field and the left lower field. She has rhonchi in the right lower field, the left middle field and the left lower field. She has rales in the left middle field and the left lower field. She exhibits no tenderness and no bony tenderness.  Abdominal: Soft. Bowel sounds are normal. She exhibits no mass. There is no tenderness. There is no rigidity, no rebound, no guarding and no CVA tenderness.  Musculoskeletal: Normal range of motion. She exhibits no edema.  Lymphadenopathy:    She has no cervical adenopathy.  Neurological: She is alert and oriented to  person, place, and time. She exhibits normal muscle tone. Coordination normal.  Speech is clear and goal oriented Moves extremities without ataxia  Skin: Skin is warm and dry. No rash noted. She is not diaphoretic. No erythema.  Psychiatric: She has a normal mood and affect. Her behavior is normal.    ED Course   Procedures (including  critical care time)  Labs Reviewed  CBC WITH DIFFERENTIAL - Abnormal; Notable for the following:    Platelets 430 (*)    All other components within normal limits  BASIC METABOLIC PANEL - Abnormal; Notable for the following:    Sodium 133 (*)    Chloride 95 (*)    Glucose, Bld 154 (*)    All other components within normal limits  HEPATIC FUNCTION PANEL - Abnormal; Notable for the following:    Albumin 3.3 (*)    Alkaline Phosphatase 163 (*)    Total Bilirubin 0.2 (*)    All other components within normal limits  GLUCOSE, CAPILLARY - Abnormal; Notable for the following:    Glucose-Capillary 105 (*)    All other components within normal limits  URINALYSIS, ROUTINE W REFLEX MICROSCOPIC   Dg Chest 2 View  06/28/2013   *RADIOLOGY REPORT*  Clinical Data: Cough, shortness of breath,, this is  CHEST - 2 VIEW  Comparison: 08/06/2006  Findings:  Grossly unchanged cardiac silhouette and mediastinal contours given persistently reduced lung volumes.  Interval development of ill- defined heterogeneous air space opacities within the left lower lung.  The right hemithorax is unchanged.  No pleural effusion or pneumothorax.  No evidence of edema.  Grossly unchanged bones including mild to moderate multilevel thoracic spine degenerative change.  Post cholecystectomy.  IMPRESSION: Findings worrisome for left lower lung pneumonia.  A follow-up chest radiograph in 4 to 6 weeks after treatment is recommended to ensure resolution.   Original Report Authenticated By: Tacey Ruiz, MD   The patient was counseled on the dangers of tobacco use, and was advised to quit.  Reviewed strategies to maximize success, including removing cigarettes and smoking materials from environment, stress management, substitution of other forms of reinforcement, support of family/friends and written materials.  1. Community acquired pneumonia   2. BACK PAIN, CHRONIC   3. DIABETES MELLITUS, TYPE II   4. OSTEOARTHRITIS   5. TOBACCO  ABUSE   6. COPD (chronic obstructive pulmonary disease)     MDM  Precious Bard presents with a Hx of COPD and worsening SOB, cough and wheezing. Significant concern for CAP on lung exam and consistent with HPI.  Patient given albuterol/Atrovent nebulizer, Levaquin IV and steroid here in the department.  Patient ambulated in ED with O2 saturations maintained >90, no current signs of respiratory distress. Lung exam improved after nebulizer treatment. Prednisone given in the ED and pt will bd dc with 5 day burst. Pt states they are breathing at baseline and her pain is controlled. Pt has been instructed to continue using prescribed medications.  Patient has been diagnosed with CAP via chest xray.  Pt is a current everyday smoker of 1+ packs per day.  Significant smoking cessation instruction/counseling given:  counseled patient on the dangers of tobacco use, advised patient to stop smoking, and reviewed strategies to maximize success.  Pt with multiple co morbidities including COPD and DM, but has close f/u capabilities with PCP.  Afebrile, nontoxic, nonseptic appearing and without hypoxia here in the department.   I believe pt can be treated as an OP with abx  therapy and steroids. Pt has been advised to return to the ED if symptoms worsen or they do not improve. Pt verbalizes understanding and is agreeable with plan.   Patient request pain medication for her myalgias and exacerbation of her chronic pain.  Informed patient that pain medications should be prescribed and managed by primary care physician. I agreed to write her enough for several days until she can followup with her primary care physician as directed.  I also informed her to the emergency department will no longer write pain medication for her.  Patient states understanding.  Dahlia Client Lenell Lama, PA-C 06/28/13 2100

## 2013-06-28 NOTE — ED Notes (Signed)
Pt states hx of COPD.  States that she has had a productive cough x 2 wks.  Went to her doctor.  Was prescribed abx and cough syrup.  States she feels worse.

## 2013-06-28 NOTE — ED Provider Notes (Signed)
Medical screening examination/treatment/procedure(s) were performed by non-physician practitioner and as supervising physician I was immediately available for consultation/collaboration.   Lyanne Co, MD 06/28/13 2102

## 2013-07-01 ENCOUNTER — Other Ambulatory Visit: Payer: Self-pay | Admitting: Family Medicine

## 2013-07-01 NOTE — Telephone Encounter (Signed)
Pt left v/m; pt wanted to make sure Dr Ermalene Searing received refill request from [facility] for ibuprofen and cyclobenzaprine and request refill done ASAP.

## 2013-07-01 NOTE — Telephone Encounter (Signed)
Last office visit 06/17/2013.  Ok to refill?

## 2013-07-02 ENCOUNTER — Telehealth: Payer: Self-pay

## 2013-07-02 ENCOUNTER — Encounter: Payer: Self-pay | Admitting: Family Medicine

## 2013-07-02 ENCOUNTER — Ambulatory Visit (INDEPENDENT_AMBULATORY_CARE_PROVIDER_SITE_OTHER): Payer: Medicare Other | Admitting: Family Medicine

## 2013-07-02 VITALS — BP 124/74 | HR 88 | Temp 98.5°F | Ht 60.0 in | Wt 222.0 lb

## 2013-07-02 DIAGNOSIS — J189 Pneumonia, unspecified organism: Secondary | ICD-10-CM

## 2013-07-02 DIAGNOSIS — E119 Type 2 diabetes mellitus without complications: Secondary | ICD-10-CM

## 2013-07-02 DIAGNOSIS — IMO0001 Reserved for inherently not codable concepts without codable children: Secondary | ICD-10-CM

## 2013-07-02 MED ORDER — ACETAMINOPHEN-CODEINE #3 300-30 MG PO TABS: 1.0000 | ORAL_TABLET | Freq: Four times a day (QID) | ORAL | Status: DC | PRN

## 2013-07-02 NOTE — Telephone Encounter (Signed)
Noted. Pt did say tylenol 3 was working better than norco for her pain. Further pain meds need to be given from Dr. Nash Dimmer office. Pt was agreeable to this in the office today.

## 2013-07-02 NOTE — Telephone Encounter (Signed)
Pt notified med sent to pharmacy . Pt voiced understanding. 

## 2013-07-02 NOTE — Telephone Encounter (Signed)
Keita at [facility] verified pt had gotten # 15 of Tylenol # 3 from Dr Andrey Farmer dr) and did get Norco from Dr Regino Schultze 06/01/13. There is a pending prescription refill request that has already been faxed to Dr Juanda Chance office for Norco 5-325 mg. Loma Sousa wanted Dr Ermalene Searing to be aware of other pain prescriptions and does Dr Ermalene Searing want pt to get Tylenol # 3 prescription today with pending Norco rx from Dr Regino Schultze. Dr Ermalene Searing said to d/c Tylenol # 3 prescription and pt should get her pain meds from Dr Regino Schultze.

## 2013-07-02 NOTE — Telephone Encounter (Signed)
Pt left v/m was upset how [facility] handled filling her prescription and wanted Dr Ermalene Searing to know she appreciates Dr Ermalene Searing and was not trying to get med from more than one doctor. Assured Sandra Ferrell Dr Ermalene Searing did not think pt was trying to do anything wrong.pt said she felt better knowing that.

## 2013-07-02 NOTE — Telephone Encounter (Signed)
[facility] called pt has pending refills from Dr Regino Schultze for [facility] 5-325 and Dr Mardene Sayer for Tylenol #3 #15. [facility] wanted [facility] to be aware of these [facility]s before filling Tylenol # 3. Pt is waiting.

## 2013-07-02 NOTE — Patient Instructions (Addendum)
Quit smoking. Complete antibiotics. Hold prednisone for now. Use symbicort twice daily... Increase to 2 puffs. Follow Blood sugars at home.. Call if remaining up. Tylenol three for pain until back specialist appt.

## 2013-07-02 NOTE — Telephone Encounter (Signed)
Keita  At [facility] left v/m that pt was not happy about pt not getting Dr Daphine Deutscher prescription. Keita cancelled refill request for Norco and Keita did fill Dr Daphine Deutscher prescription. Pt cannot get med filled from Dr Regino Schultze until seen.

## 2013-07-02 NOTE — Progress Notes (Signed)
  Subjective:    Patient ID: Sandra Ferrell, female    DOB: Nov 02, 1961, 52 y.o.   MRN: [id number]  HPI  52 year old female with DM and COPD returns to clinic following ER visit at [facility] on 06/28/2013 for cough, SOB, congstion, chest pain and nausea, low grade fever.  Nml UA and labs except low sodium. Given IV fluids.  She was diagnosed with CA PNA, left lower lung. Recommended repeat in 4-6 weeks for resolution.  Started levaquin 750 x 7 days and steroid taper ( she has not taken much of the steroid given BP increase) Continuing symbiont and using albuterol twice daily. She feels significantly better. Wheezing and cough has improved. No further fever.  Was having a lot of pain with body aches, fibromyalgia flare ... Was given tylenol 3 in ER. Back pain appt (Dr. Modesto Charon) for hydrocodone, but she feels the tylenol 3 has helped more. When she called then to let them know about the PNA and the moved her appt back.. 07/10/2013  She has decrease smoking significantly.  Blood sugars have been high since she has been on steroids... CBG 221-514 after meals one night. Review of Systems  Constitutional: Negative for fever and fatigue.  HENT: Negative for ear pain.   Eyes: Negative for pain.  Respiratory: Positive for cough and wheezing. Negative for chest tightness and shortness of breath.   Cardiovascular: Negative for chest pain, palpitations and leg swelling.  Gastrointestinal: Negative for abdominal pain.  Genitourinary: Negative for dysuria.       Objective:   Physical Exam  Constitutional: Vital signs are normal. She appears well-developed and well-nourished. She is cooperative.  Non-toxic appearance. She does not appear ill. No distress.  Overweight female in NAD  HENT:  Head: Normocephalic.  Right Ear: Hearing, tympanic membrane, external ear and ear canal normal. Tympanic membrane is not erythematous, not retracted and not bulging.  Left Ear: Hearing, tympanic membrane, external  ear and ear canal normal. Tympanic membrane is not erythematous, not retracted and not bulging.  Nose: No mucosal edema or rhinorrhea. Right sinus exhibits no maxillary sinus tenderness and no frontal sinus tenderness. Left sinus exhibits no maxillary sinus tenderness and no frontal sinus tenderness.  Mouth/Throat: Uvula is midline, oropharynx is clear and moist and mucous membranes are normal.  Eyes: Conjunctivae, EOM and lids are normal. Pupils are equal, round, and reactive to light. Lids are everted and swept, no foreign bodies found.  Neck: Trachea normal and normal range of motion. Neck supple. Carotid bruit is not present. No mass and no thyromegaly present.  Cardiovascular: Normal rate, regular rhythm, S1 normal, S2 normal, normal heart sounds, intact distal pulses and normal pulses.  Exam reveals no gallop and no friction rub.   No murmur heard. Pulmonary/Chest: Effort normal and breath sounds normal. Not tachypneic. No respiratory distress. She has no decreased breath sounds. She has no wheezes. She has no rhonchi. She has no rales.  Abdominal: Soft. Normal appearance and bowel sounds are normal. There is no tenderness.  Neurological: She is alert.  Skin: Skin is warm, dry and intact. No rash noted.  Psychiatric: Her speech is normal and behavior is normal. Judgment and thought content normal. Her mood appears not anxious. Cognition and memory are normal. She does not exhibit a depressed mood.          Assessment & Plan:

## 2013-07-03 ENCOUNTER — Other Ambulatory Visit: Payer: Self-pay | Admitting: Family Medicine

## 2013-07-03 NOTE — Telephone Encounter (Signed)
Received refill request electronically. Lat office visit 07/02/13. Is it okay to refill medication?

## 2013-07-03 NOTE — Telephone Encounter (Signed)
Rx called to pharmacy as instructed. 

## 2013-07-06 ENCOUNTER — Other Ambulatory Visit: Payer: Self-pay | Admitting: Family Medicine

## 2013-07-07 NOTE — Telephone Encounter (Signed)
Received refill request electronically. Last office visit 07/02/13. See allergy/contraindication. Is it okay to refill medication?

## 2013-07-10 ENCOUNTER — Encounter: Payer: Self-pay | Admitting: Family Medicine

## 2013-07-10 ENCOUNTER — Ambulatory Visit (INDEPENDENT_AMBULATORY_CARE_PROVIDER_SITE_OTHER): Payer: Medicare Other | Admitting: Family Medicine

## 2013-07-10 VITALS — BP 130/76 | HR 92 | Temp 97.6°F | Ht <= 58 in | Wt 222.5 lb

## 2013-07-10 DIAGNOSIS — IMO0001 Reserved for inherently not codable concepts without codable children: Secondary | ICD-10-CM

## 2013-07-10 DIAGNOSIS — J209 Acute bronchitis, unspecified: Secondary | ICD-10-CM

## 2013-07-10 DIAGNOSIS — K5900 Constipation, unspecified: Secondary | ICD-10-CM | POA: Insufficient documentation

## 2013-07-10 NOTE — Patient Instructions (Addendum)
Increase water. Start miralax daily until regular bowel movements. Stop norco and tylenol 3. Use tramadol for pain instead. Work on stretching and exercise as able. Keep appt in 4 weeks. Cancel 09/2013 appt.

## 2013-07-10 NOTE — Assessment & Plan Note (Signed)
Likely from pain meds. Likely causing abd pain and nausea.  Stop narcotics, use tramadol for pain. Start miralax, increase water and fiber.

## 2013-07-10 NOTE — Assessment & Plan Note (Signed)
Current flare.  Encoiuraged stretching, exercsie. Limit pain medicaitons.

## 2013-07-10 NOTE — Assessment & Plan Note (Signed)
Resolving PNA/ bronchitis.

## 2013-07-10 NOTE — Progress Notes (Signed)
  Subjective:    Patient ID: Sandra Ferrell, female    DOB: [birth year], 52 y.o.   MRN: [id number]  HPI  52 year old femlae with COPD returns for follow up Ca pneumonia. She did not tolerated fluroquinolone... So was changed to azithromycin.  She reports that her breathing is better, chest congestion improved, cough improved. No fever.  She report though that she is just felling ill.  Still having a lot of body.  She has noted swelling in upper stomach. Has not had BM since a few days. Decreased appetitie. She has been using norco or tylenol 3... Thinks this may part of the issue.. Nauseous, decreased appetite. No vomiting. She is using stool softner, but using only rarely.    Review of Systems  Constitutional: Negative for fever and fatigue.  HENT: Negative for ear pain.   Eyes: Negative for pain.  Respiratory: Negative for chest tightness, shortness of breath and wheezing.   Cardiovascular: Negative for chest pain, palpitations and leg swelling.  Gastrointestinal: Positive for abdominal pain and abdominal distention.  Genitourinary: Negative for dysuria.       Objective:   Physical Exam  Constitutional: Vital signs are normal. She appears well-developed and well-nourished. She is cooperative.  Non-toxic appearance. She does not appear ill. No distress.  Overweight fatigued appearing in NAD  HENT:  Head: Normocephalic.  Right Ear: Hearing, tympanic membrane, external ear and ear canal normal. Tympanic membrane is not erythematous, not retracted and not bulging.  Left Ear: Hearing, tympanic membrane, external ear and ear canal normal. Tympanic membrane is not erythematous, not retracted and not bulging.  Nose: No mucosal edema or rhinorrhea. Right sinus exhibits no maxillary sinus tenderness and no frontal sinus tenderness. Left sinus exhibits no maxillary sinus tenderness and no frontal sinus tenderness.  Mouth/Throat: Uvula is midline, oropharynx is clear and moist and mucous  membranes are normal.  Eyes: Conjunctivae, EOM and lids are normal. Pupils are equal, round, and reactive to light. Lids are everted and swept, no foreign bodies found.  Neck: Trachea normal and normal range of motion. Neck supple. Carotid bruit is not present. No mass and no thyromegaly present.  Cardiovascular: Normal rate, regular rhythm, S1 normal, S2 normal, normal heart sounds, intact distal pulses and normal pulses.  Exam reveals no gallop and no friction rub.   No murmur heard. Pulmonary/Chest: Effort normal and breath sounds normal. Not tachypneic. No respiratory distress. She has no decreased breath sounds. She has no wheezes. She has no rhonchi. She has no rales.  Abdominal: Soft. Normal appearance and bowel sounds are normal. There is generalized tenderness.  Neurological: She is alert.  Skin: Skin is warm, dry and intact. No rash noted.  Psychiatric: Her speech is normal and behavior is normal. Judgment and thought content normal. Her mood appears not anxious. Cognition and memory are normal. She does not exhibit a depressed mood.          Assessment & Plan:

## 2013-07-12 NOTE — Assessment & Plan Note (Addendum)
Improving. Quit smoking. Complete antibiotics. Hold prednisone for now given elevated CBGs Use symbicort twice daily... Increase to 2 puffs.

## 2013-07-12 NOTE — Assessment & Plan Note (Signed)
Worsened control due to prednisone for COPD/PNA. Stop prednisone.  Follow CBGs, call if not coming down. Continue current meds.

## 2013-07-12 NOTE — Assessment & Plan Note (Signed)
Ongoing flare. Tylenol 3 from ER worked better per pt then norco. Will prescribe tylenol 3 small amount to last until follow up with  [facility]TH[facility].

## 2013-07-26 ENCOUNTER — Other Ambulatory Visit: Payer: Self-pay | Admitting: Family Medicine

## 2013-07-26 NOTE — Telephone Encounter (Signed)
Last office visit 07/10/1013. Ok to refill?

## 2013-08-01 ENCOUNTER — Other Ambulatory Visit: Payer: Self-pay | Admitting: Family Medicine

## 2013-08-02 NOTE — Telephone Encounter (Signed)
Last OV 07/10/2013. Ok to refill?

## 2013-08-03 NOTE — Telephone Encounter (Signed)
Pt left v/m requesting refill alprazolam to [facility].Please advise.

## 2013-08-04 NOTE — Telephone Encounter (Signed)
Called to Pharmacy.  Sandra Ferrell notified refill has been called to her pharmacy.

## 2013-08-13 ENCOUNTER — Ambulatory Visit: Payer: Medicare Other | Admitting: Family Medicine

## 2013-08-18 ENCOUNTER — Ambulatory Visit: Payer: Medicare Other | Admitting: Family Medicine

## 2013-08-25 ENCOUNTER — Ambulatory Visit: Payer: Medicare Other | Admitting: Family Medicine

## 2013-08-26 ENCOUNTER — Other Ambulatory Visit: Payer: Self-pay | Admitting: Family Medicine

## 2013-08-26 NOTE — Telephone Encounter (Signed)
Last office visit 07/10/2013.  Ok to refill? 

## 2013-08-27 ENCOUNTER — Telehealth: Payer: Self-pay

## 2013-08-27 ENCOUNTER — Telehealth: Payer: Self-pay | Admitting: *Deleted

## 2013-08-27 ENCOUNTER — Other Ambulatory Visit: Payer: Self-pay | Admitting: Family Medicine

## 2013-08-27 NOTE — Telephone Encounter (Signed)
Last OV 07/10/2013.  Tramadol not on current medication list.  Ok to refill?

## 2013-08-27 NOTE — Telephone Encounter (Signed)
Called to [facility]. 

## 2013-08-27 NOTE — Telephone Encounter (Signed)
[facility] to make sure Dr. Ermalene Searing was aware that Ms. Sandra Ferrell was getting Norco 5-325 mg for Dr. Regino Schultze.  Last filled on 08/07/2013 for # 60.  Instructions to take two times a day.  Dr. Ermalene Searing notified.  Pharmacist notified to cancel Tramadol prescription we called in to pharmacy earlier this afternoon.

## 2013-08-27 NOTE — Telephone Encounter (Signed)
Pt left v/m requesting refill Tramadol to [facility]; pt has misplaced her bottle of Tramadol. Pt has appt already scheduled with Dr Ermalene Searing on 08/28/13 at 2:45 pm.pt request cb. Please note other phone note on 08/27/13 which states Tramadol that was filled today was cancelled.

## 2013-08-27 NOTE — Telephone Encounter (Signed)
Will discuss at tomoorows OV.. We have been told by pharmacy that she is getting Norco from Dr. Regino Schultze.

## 2013-08-27 NOTE — Telephone Encounter (Signed)
Opened in error

## 2013-08-28 ENCOUNTER — Encounter: Payer: Self-pay | Admitting: Family Medicine

## 2013-08-28 ENCOUNTER — Ambulatory Visit (INDEPENDENT_AMBULATORY_CARE_PROVIDER_SITE_OTHER): Payer: Medicare Other | Admitting: Family Medicine

## 2013-08-28 ENCOUNTER — Other Ambulatory Visit: Payer: Self-pay | Admitting: Family Medicine

## 2013-08-28 VITALS — BP 138/68 | HR 96 | Temp 98.6°F | Ht 60.0 in | Wt 231.2 lb

## 2013-08-28 DIAGNOSIS — E119 Type 2 diabetes mellitus without complications: Secondary | ICD-10-CM

## 2013-08-28 DIAGNOSIS — I10 Essential (primary) hypertension: Secondary | ICD-10-CM

## 2013-08-28 DIAGNOSIS — M549 Dorsalgia, unspecified: Secondary | ICD-10-CM

## 2013-08-28 DIAGNOSIS — B309 Viral conjunctivitis, unspecified: Secondary | ICD-10-CM

## 2013-08-28 DIAGNOSIS — Z23 Encounter for immunization: Secondary | ICD-10-CM

## 2013-08-28 DIAGNOSIS — F339 Major depressive disorder, recurrent, unspecified: Secondary | ICD-10-CM

## 2013-08-28 NOTE — Patient Instructions (Addendum)
Return for fasting lab in next 1-2 weeks.  Need to get pain medication only from one place.. For now that will be Dr. Modesto Charon.

## 2013-08-28 NOTE — Telephone Encounter (Signed)
Last OV 07/10/2013. Ok to refill?

## 2013-08-28 NOTE — Progress Notes (Signed)
52 year old female presents for 3 month DM follow up.   Recently she has left eye itching, redness, watery. Had some mascara in right eye On 08/17/2016  Started using clear eyes.  No improvement and symptoms spread to right eye.  Saw on call MD Dr. Allyne Gee eye MD 1 week ago. Dx with conjunctivitis, viral. Started on systane and refresh. She has had some improvement in symptoms, but still itchy. No foreign body sensation.   Diabetes: Improved control with increase in actos (max) and on max glipizide.   Due for re-eval. Lab Results  Component Value Date   HGBA1C 8.9* 06/11/2013   Using medications without difficulties:None   Hypoglycemic episodes:None   Hyperglycemic episodes:None   Feet problems:None Blood Sugars averaging: FBS 115-180 2 hour postprandial 160 eye exam within last year: None    Wt Readings from Last 3 Encounters:  08/28/13 231 lb 4 oz (104.894 kg)  07/10/13 222 lb 8 oz (100.925 kg)  07/02/13 222 lb (100.699 kg)    Hypertension: Above goal today in office on no medication.   BP Readings from Last 3 Encounters:  08/28/13 138/68  07/10/13 130/76  07/02/13 124/74  Using medication without problems or lightheadedness: None   Chest pain with exertion:None   Edema:None   Short of breath:None   Average home BPs: not checking   Other issues:    Elevated Cholesterol: Good control LDL and trigs on gemfibrozil, fish oils at last check 3 months ago... Not due yet for re-check  Lab Results   Component  Value  Date     CHOL  147  06/11/2013     HDL  38.90*  06/11/2013     LDLCALC  85  06/11/2013     LDLDIRECT  59.4  09/26/2009     TRIG  114.0  06/11/2013     CHOLHDL  4  06/11/2013   Using medications without problems:none   Muscle aches: None   Diet compliance: Good   Exercise: Walking   Other complaints:    Dr. Modesto Charon for [facility]:  Has scheduled nerve deadening procedure next week. Using norco .. Given rx on 10/3 # 60.. 1 tab po twice daily. She has occ taken a  third tablet at bedtime. Using ibuprofen every other day. We denied tramadol given she has gotten norco.   Review of Systems   Constitutional: Negative for fever and fatigue.   HENT: Positive for congestion. Negative for ear pain, sneezing and ear discharge.   Eyes: Negative for pain and redness.   Respiratory: Positive for cough. Negative for chest tightness and shortness of breath.   Cardiovascular: Negative for chest pain, palpitations and leg swelling.   Gastrointestinal: Negative for abdominal pain.   Genitourinary: Negative for dysuria.   Objective:     Physical Exam  Constitutional: Vital signs are normal. She appears well-developed and well-nourished. Obese She is cooperative. Non-toxic appearance. She does not appear ill. No distress.   HENT:   Head: Normocephalic.  Right Ear: Hearing, tympanic membrane, external ear and ear canal normal. Tympanic membrane is not erythematous, not retracted and not bulging.  Left Ear: Hearing, tympanic membrane, external ear and ear canal normal. Tympanic membrane is not erythematous, not retracted and not bulging.   Nose: No mucosal edema or rhinorrhea. Right sinus exhibits no maxillary sinus tenderness and no frontal sinus tenderness. Left sinus exhibits no maxillary sinus tenderness and no frontal sinus tenderness.   Mouth/Throat: Uvula is midline, oropharynx is clear and moist  and mucous membranes are normal.   Eyes: Conjunctivae minimal if any erythema at this point, EOM and lids are normal. Pupils are equal, round, and reactive to light. No foreign bodies found.   Neck: Trachea normal and normal range of motion. Neck supple. Carotid bruit is not present. No mass and no thyromegaly present.   Cardiovascular: Normal rate, regular rhythm, S1 normal, S2 normal, normal heart sounds, intact distal pulses and normal pulses. Exam reveals no gallop and no friction rub.   No murmur heard.   Pulmonary/Chest: Effort normal and breath sounds normal. Not  tachypneic. No respiratory distress. She has no decreased breath sounds. She has no wheezes. She has no rhonchi. She has no rales.   Abdominal: Soft. Normal appearance and bowel sounds are normal. There is no tenderness.  Neurological: She is alert.   Skin: Skin is warm, dry and intact. No rash noted.  Psychiatric: Her speech is normal and behavior is normal. Judgment and thought content normal. Her mood appears not anxious. Cognition and memory are normal. She does not exhibit a depressed mood.    Diabetic foot exam:   Normal inspection   No skin breakdown   No calluses   Normal DP pulses   Normal sensation to light touch and monofilament   Nails normal

## 2013-08-28 NOTE — Assessment & Plan Note (Signed)
Stable control on current meds. 

## 2013-08-31 DIAGNOSIS — B309 Viral conjunctivitis, unspecified: Secondary | ICD-10-CM | POA: Insufficient documentation

## 2013-08-31 NOTE — Assessment & Plan Note (Signed)
Now resolving... Recommended contiue hydrating drops for lubricant.

## 2013-08-31 NOTE — Assessment & Plan Note (Signed)
Due for re-eval, will likley need addition of victoza, pt interested,

## 2013-08-31 NOTE — Assessment & Plan Note (Signed)
Discussed chronic pain issues... Pt can get pain med from only one MD. For now this will be her ORTH[facility] MD... She will use norco, if the amount she has is not enough o last a month she needs to discuss this with him. No refill of tramadol given. Pt unhappy but in agreement.

## 2013-08-31 NOTE — Assessment & Plan Note (Signed)
Well controlled. Continue current medication.  

## 2013-09-02 ENCOUNTER — Other Ambulatory Visit: Payer: Self-pay | Admitting: Family Medicine

## 2013-09-02 NOTE — Telephone Encounter (Signed)
Last office visit 08/28/2013.  Ok to refill? 

## 2013-09-02 NOTE — Telephone Encounter (Signed)
Pt left v/m requesting cb when med called to pharmacy.

## 2013-09-03 ENCOUNTER — Other Ambulatory Visit: Payer: Self-pay | Admitting: Family Medicine

## 2013-09-03 NOTE — Telephone Encounter (Signed)
Called to [facility]. 

## 2013-09-07 ENCOUNTER — Other Ambulatory Visit (INDEPENDENT_AMBULATORY_CARE_PROVIDER_SITE_OTHER): Payer: Medicare Other

## 2013-09-07 DIAGNOSIS — E119 Type 2 diabetes mellitus without complications: Secondary | ICD-10-CM

## 2013-09-07 LAB — HEMOGLOBIN A1C: Hgb A1c MFr Bld: 9.8 % — ABNORMAL HIGH (ref 4.6–6.5)

## 2013-09-10 ENCOUNTER — Telehealth: Payer: Self-pay

## 2013-09-10 NOTE — Telephone Encounter (Signed)
Pt wanted pharmacy changed in her record to [facility]. Advised pt done.

## 2013-09-11 ENCOUNTER — Telehealth: Payer: Self-pay | Admitting: Family Medicine

## 2013-09-11 MED ORDER — LIRAGLUTIDE 18 MG/3ML ~~LOC~~ SOPN: 0.6000 mg | PEN_INJECTOR | Freq: Every day | SUBCUTANEOUS | Status: DC

## 2013-09-11 NOTE — Telephone Encounter (Signed)
Patient notified as instructed by telephone. 

## 2013-09-11 NOTE — Telephone Encounter (Signed)
Message copied by Excell Seltzer on Fri Sep 11, 2013  9:43 AM ------      Message from: Damita Lack      Created: Fri Sep 11, 2013  9:35 AM       Patient notified as instructed by telephone.  Would like to go ahead and start Victoza. ------

## 2013-09-11 NOTE — Telephone Encounter (Signed)
Will start victoza... Have pt start t 0.6 mg daily injection, if tolerated after 1  Week increase to 1.2 mg daily.

## 2013-09-18 ENCOUNTER — Ambulatory Visit: Payer: Medicare Other | Admitting: Family Medicine

## 2013-09-23 ENCOUNTER — Telehealth: Payer: Self-pay

## 2013-09-23 NOTE — Telephone Encounter (Signed)
Pt left v/m and pt said she has not used the Victoza pen yet; pt said she is afraid of side effects and afraid of giving med by herself and is not sure of steps to give medication. [facility] offers free instruction on administering insulin. Please advise.

## 2013-09-24 ENCOUNTER — Other Ambulatory Visit: Payer: Self-pay | Admitting: Family Medicine

## 2013-09-24 NOTE — Telephone Encounter (Signed)
Patient notified as instructed by telephone.  She is in agreement to go to her pharmacy and get instructions on how to use the Victoza pen.

## 2013-09-24 NOTE — Telephone Encounter (Signed)
I would recommend that she go to her pharmacy for free instruction ( they usually explain how to use it before giving new prescription to pt, I am not sure why they did not).  SE are usually minimal ie nausea ( less than the medications she is already on.) If she wants to make an appt for me to show her how to use the pen or to discuss further, she can.

## 2013-09-28 ENCOUNTER — Other Ambulatory Visit: Payer: Self-pay | Admitting: *Deleted

## 2013-09-28 NOTE — Telephone Encounter (Signed)
Last office visit 08/28/2013.  Ok to refill? 

## 2013-09-29 ENCOUNTER — Other Ambulatory Visit: Payer: Self-pay

## 2013-09-29 MED ORDER — CYCLOBENZAPRINE HCL 10 MG PO TABS: ORAL_TABLET | ORAL | Status: DC

## 2013-09-29 NOTE — Telephone Encounter (Signed)
Pt left vm requesting refill alprazolam to [facility].Please advise.

## 2013-09-30 MED ORDER — ALPRAZOLAM 0.5 MG PO TABS: ORAL_TABLET | ORAL | Status: DC

## 2013-09-30 NOTE — Telephone Encounter (Signed)
Ok to refill 90, 0 refills 

## 2013-09-30 NOTE — Telephone Encounter (Signed)
Pt will be out of med on 10/02/13 and request refill be done today due to the holiday. Pt request cb when done.

## 2013-09-30 NOTE — Telephone Encounter (Signed)
Called to [facility].  [place] Citizen notified prescription has been called to pharmacy.

## 2013-10-05 ENCOUNTER — Telehealth: Payer: Self-pay

## 2013-10-05 NOTE — Telephone Encounter (Signed)
Elfredia Nevins RN case mgr with [employer] left v/m requesting 08/28/13 BP 138/68, 06/11/13 Chol 147; trig 114;  HDL 38.9;  LDL 85;   09/07/13 A1C 9.8. V/m left on confidential v/m per request. Pt is enrolled in [facility] program.

## 2013-10-14 ENCOUNTER — Other Ambulatory Visit: Payer: Self-pay | Admitting: Family Medicine

## 2013-10-14 ENCOUNTER — Telehealth: Payer: Self-pay

## 2013-10-14 DIAGNOSIS — Z1231 Encounter for screening mammogram for malignant neoplasm of breast: Secondary | ICD-10-CM

## 2013-10-14 NOTE — Telephone Encounter (Signed)
Notify Ms. Davis: Looks like she has not had this since 2012..agree it should be done yearly. Will add to next set of DM labs.

## 2013-10-14 NOTE — Telephone Encounter (Signed)
Elfredia Nevins RN case manager with [employer] left v/m; Lyla Son wants to know if pt has ever had micro albumin creatinine ratio done and if not would Dr Ermalene Searing consider pt having that test. Lyla Son Request cb.

## 2013-10-15 ENCOUNTER — Telehealth: Payer: Self-pay | Admitting: *Deleted

## 2013-10-15 NOTE — Telephone Encounter (Signed)
Left message on Sandra Ferrell's confidential voicemail that last micro albumin ratio was done in 2012.  Dr. Ermalene Searing will order this test at Sandra Ferrell's next set of DM labs.

## 2013-10-15 NOTE — Telephone Encounter (Signed)
Received fax from [facility] for diabetic testing supplies.  Verified with Mrs. Hemphill that is did request these supplies.  Form placed in Dr. Daphine Deutscher in box for signature.

## 2013-10-19 NOTE — Telephone Encounter (Signed)
Form faxed to [facility].

## 2013-10-22 ENCOUNTER — Other Ambulatory Visit: Payer: Self-pay | Admitting: *Deleted

## 2013-10-22 MED ORDER — IBUPROFEN 800 MG PO TABS: ORAL_TABLET | ORAL | Status: DC

## 2013-10-22 NOTE — Telephone Encounter (Signed)
Last office visit 08/28/2013.  Ok to refill? 

## 2013-10-23 ENCOUNTER — Other Ambulatory Visit: Payer: Self-pay

## 2013-10-23 MED ORDER — ALPRAZOLAM 0.5 MG PO TABS: ORAL_TABLET | ORAL | Status: DC

## 2013-10-23 MED ORDER — CYCLOBENZAPRINE HCL 10 MG PO TABS: ORAL_TABLET | ORAL | Status: DC

## 2013-10-23 NOTE — Telephone Encounter (Signed)
Pt request refill for alprazolam and cyclobenzaprine to [facility] pharmacy. Pt is going out of town 10/28/13 for several weeks and needs med called to pharmacy by 10/27/13.Please advise.

## 2013-10-23 NOTE — Telephone Encounter (Signed)
Called to [facility]. 

## 2013-11-04 ENCOUNTER — Ambulatory Visit (HOSPITAL_COMMUNITY): Payer: Medicare Other

## 2013-11-04 ENCOUNTER — Other Ambulatory Visit: Payer: Self-pay

## 2013-11-04 MED ORDER — AMITRIPTYLINE HCL 50 MG PO TABS: 75.0000 mg | ORAL_TABLET | Freq: Every day | ORAL | Status: DC

## 2013-11-04 NOTE — Telephone Encounter (Signed)
Last filled 09/28/13 according to pharmacy--I did not see where it had been prescribed anytime recently here--please advise

## 2013-11-16 ENCOUNTER — Other Ambulatory Visit: Payer: Self-pay | Admitting: *Deleted

## 2013-11-16 ENCOUNTER — Ambulatory Visit (HOSPITAL_COMMUNITY): Payer: Medicare Other

## 2013-11-16 MED ORDER — METFORMIN HCL 1000 MG PO TABS: 1000.0000 mg | ORAL_TABLET | Freq: Two times a day (BID) | ORAL | Status: DC

## 2013-11-17 ENCOUNTER — Other Ambulatory Visit: Payer: Self-pay | Admitting: *Deleted

## 2013-11-17 MED ORDER — IBUPROFEN 800 MG PO TABS: ORAL_TABLET | ORAL | Status: DC

## 2013-11-17 NOTE — Telephone Encounter (Signed)
Received faxed refill request from pharmacy. Last refill 10/22/13/1 refill, last office visit 08/28/13. Called pharmacy and was advised that patient picked up her last refill today. See allergy/contraindication. Is it okay to refill medication?

## 2013-11-18 ENCOUNTER — Ambulatory Visit (HOSPITAL_COMMUNITY): Payer: Medicare Other | Attending: Family Medicine

## 2013-11-25 ENCOUNTER — Telehealth: Payer: Self-pay

## 2013-11-25 ENCOUNTER — Other Ambulatory Visit: Payer: Self-pay

## 2013-11-25 NOTE — Telephone Encounter (Signed)
Last office visit 08/28/2013.  Ok to refill?

## 2013-11-25 NOTE — Telephone Encounter (Signed)
Last filled 10/23/13--please advise

## 2013-11-25 NOTE — Telephone Encounter (Signed)
Pt left v/m requesting refills alprazolam and cyclobenzaprine to [facility]. Pt said her husband had a heart attack last week and pt has been overwhelmed.Please advise.

## 2013-11-26 MED ORDER — CYCLOBENZAPRINE HCL 10 MG PO TABS: ORAL_TABLET | ORAL | Status: DC

## 2013-11-26 MED ORDER — ALPRAZOLAM 0.5 MG PO TABS: ORAL_TABLET | ORAL | Status: DC

## 2013-11-26 NOTE — Telephone Encounter (Signed)
Chrys Racer notified prescriptions have been sent to her pharmacy.

## 2013-11-26 NOTE — Telephone Encounter (Signed)
What prescription is being requested?

## 2013-11-26 NOTE — Telephone Encounter (Signed)
Alprazolam called to [facility]. 

## 2013-11-26 NOTE — Telephone Encounter (Signed)
Pt called to ck on status of alprazolam refill; spoke with Lovena Le at [facility] they do not have alprazolam refill; Medication phoned to [facility] as instructed.pt notified done again.

## 2013-12-11 ENCOUNTER — Telehealth: Payer: Self-pay | Admitting: Family Medicine

## 2013-12-11 NOTE — Telephone Encounter (Signed)
error 

## 2013-12-14 ENCOUNTER — Encounter: Payer: Self-pay | Admitting: Family Medicine

## 2013-12-14 ENCOUNTER — Ambulatory Visit (INDEPENDENT_AMBULATORY_CARE_PROVIDER_SITE_OTHER): Payer: Medicare Other | Admitting: Family Medicine

## 2013-12-14 VITALS — BP 124/70 | HR 96 | Temp 98.2°F | Ht 60.0 in | Wt 237.5 lb

## 2013-12-14 DIAGNOSIS — I10 Essential (primary) hypertension: Secondary | ICD-10-CM

## 2013-12-14 DIAGNOSIS — J209 Acute bronchitis, unspecified: Secondary | ICD-10-CM | POA: Insufficient documentation

## 2013-12-14 DIAGNOSIS — E119 Type 2 diabetes mellitus without complications: Secondary | ICD-10-CM

## 2013-12-14 DIAGNOSIS — E785 Hyperlipidemia, unspecified: Secondary | ICD-10-CM

## 2013-12-14 LAB — HM DIABETES FOOT EXAM

## 2013-12-14 MED ORDER — AZITHROMYCIN 250 MG PO TABS: ORAL_TABLET | ORAL | Status: DC

## 2013-12-14 NOTE — Progress Notes (Signed)
Pre-visit discussion using our clinic review tool. No additional management support is needed unless otherwise documented below in the visit note.  

## 2013-12-14 NOTE — Assessment & Plan Note (Signed)
Well controlled on no med. 

## 2013-12-14 NOTE — Assessment & Plan Note (Signed)
Return for fasting labs.

## 2013-12-14 NOTE — Progress Notes (Signed)
53 year old female presents for 3 month DM follow up.   She also has the following acute concerns:  4 weeks of sneeze, congestion, nasal and chest. Cough, productive...brownish yellow. No SOB, no wheeze. No ear pain, no face pain.No fever.  Using alka seltzer plus day and night , robitussin DM max, cold and flu med.  Hx of allergies.. Dust, mold, pets. Still smoking.  Diabetes: Improved control with increase in actos (max) and on max glipizide.  Using victoza 1.2 mg daily now on this for 4 months. Due for re-eval.  Lab Results  Component Value Date   HGBA1C 9.8* 09/07/2013  Using medications without difficulties:None  Hypoglycemic episodes:None  Hyperglycemic episodes:None  Feet problems:None  Blood Sugars averaging: FBS 115-151, 2 hour postprandial none eye exam within last year: None  Wt Readings from Last 3 Encounters:  12/14/13 237 lb 8 oz (107.729 kg)  08/28/13 231 lb 4 oz (104.894 kg)  07/10/13 222 lb 8 oz (100.925 kg)    Hypertension: At goal on  No medication. BP Readings from Last 3 Encounters:  12/14/13 124/70  08/28/13 138/68  07/10/13 130/76  Using medication without problems or lightheadedness: None  Chest pain with exertion:None  Edema:None  Short of breath:None  Average home BPs: not checking  Other issues:   Elevated Cholesterol: Good control LDL and trigs on gemfibrozil, fish oils at last check 60months ago... Due yet for re-check  Lab Results  Component Value Date   CHOL 147 06/11/2013   HDL 38.90* 06/11/2013   LDLCALC 85 06/11/2013   LDLDIRECT 59.4 09/26/2009   TRIG 114.0 06/11/2013   CHOLHDL 4 06/11/2013   Using medications without problems:none  Muscle aches: None  Diet compliance: Good  Exercise: Walking  Other complaints:   Dr. Jacelyn Grip for [facility]: s/p nerve deadening procedure. Has helped some.  She refuses surgery. Using norco 1 tab po once to twice daily. Using ibuprofen every other day.  Okay now to return to exercise.   Review of  Systems  Constitutional: Negative for fever and fatigue.  HENT: Positive for congestion. Negative for ear pain, sneezing and ear discharge.  Eyes: Negative for pain and redness.  Respiratory: Positive for cough. Negative for chest tightness and shortness of breath.  Cardiovascular: Negative for chest pain, palpitations and leg swelling.  Gastrointestinal: Negative for abdominal pain.  Genitourinary: Negative for dysuria.   Objective:   Physical Exam  Constitutional: Vital signs are normal. She appears well-developed and well-nourished. Obese She is cooperative. Non-toxic appearance. She does not appear ill. No distress.  HENT:  Head: Normocephalic.  Right Ear: Hearing, tympanic membrane, external ear and ear canal normal. Tympanic membrane is not erythematous, not retracted and not bulging.  Left Ear: Hearing, tympanic membrane, external ear and ear canal normal. Tympanic membrane is not erythematous, not retracted and not bulging.  Nose: Positive mucosal edema and rhinorrhea. Right sinus exhibits no maxillary sinus tenderness and no frontal sinus tenderness. Left sinus exhibits no maxillary sinus tenderness and no frontal sinus tenderness.  Mouth/Throat: Uvula is midline, oropharynx is clear and moist and mucous membranes are normal.  Eyes: Conjunctivae clear, EOM and lids are normal. Pupils are equal, round, and reactive to light. No foreign bodies found.  Neck: Trachea normal and normal range of motion. Neck supple. Carotid bruit is not present. No mass and no thyromegaly present.  Cardiovascular: Normal rate, regular rhythm, S1 normal, S2 normal, normal heart sounds, intact distal pulses and normal pulses. Exam reveals no gallop and  no friction rub.  No murmur heard.  Pulmonary/Chest: Effort normal and breath sounds normal. Not tachypneic. No respiratory distress. She has no decreased breath sounds. She has no wheezes. She has no rhonchi. She has no rales.  Abdominal: Soft. Normal  appearance and bowel sounds are normal. There is no tenderness.  Neurological: She is alert.  Skin: Skin is warm, dry and intact. No rash noted.  Psychiatric: Her speech is normal and behavior is normal. Judgment and thought content normal. Her mood appears not anxious. Cognition and memory are normal. She does not exhibit a depressed mood.   Diabetic foot exam:  Normal inspection  No skin breakdown  No calluses  Normal DP pulses  Normal sensation to light touch and monofilament  Nails normal

## 2013-12-14 NOTE — Assessment & Plan Note (Signed)
Improving with addition of victoza. Due for re-eval. MAy need to increase to victoza max. Encouraged exercise, weight loss, healthy eating habits.

## 2013-12-14 NOTE — Patient Instructions (Addendum)
Return for fasting labs for DM chol. Get back to regular exercise. [employer] is a great idea. Complete course of antibiotics. Mucinex DM or generic. Nasal saline spray  2-3 times  daily.   Schedule DM follow up in 3 months with labs prior.

## 2013-12-14 NOTE — Assessment & Plan Note (Signed)
Treat with antibiotics given smoking history.

## 2013-12-15 ENCOUNTER — Telehealth: Payer: Self-pay | Admitting: Family Medicine

## 2013-12-15 NOTE — Telephone Encounter (Signed)
Relevant patient education mailed to patient.  

## 2013-12-21 ENCOUNTER — Other Ambulatory Visit: Payer: Medicare Other

## 2013-12-24 ENCOUNTER — Other Ambulatory Visit: Payer: Self-pay | Admitting: *Deleted

## 2013-12-24 NOTE — Telephone Encounter (Signed)
Last office visit 12/14/2013.  Last filled 11/25/2013.  Ok to refill?

## 2013-12-25 ENCOUNTER — Other Ambulatory Visit: Payer: Self-pay

## 2013-12-25 MED ORDER — ALPRAZOLAM 0.5 MG PO TABS: ORAL_TABLET | ORAL | Status: DC

## 2013-12-25 MED ORDER — CYCLOBENZAPRINE HCL 10 MG PO TABS: ORAL_TABLET | ORAL | Status: DC

## 2013-12-25 NOTE — Telephone Encounter (Signed)
Pt left v/m requesting refill alprazolam to [facility].Please advise.

## 2013-12-28 NOTE — Telephone Encounter (Signed)
Called to [facility]. 

## 2013-12-30 ENCOUNTER — Other Ambulatory Visit: Payer: Medicare Other

## 2014-01-01 ENCOUNTER — Other Ambulatory Visit: Payer: Self-pay | Admitting: *Deleted

## 2014-01-01 MED ORDER — IBUPROFEN 800 MG PO TABS: ORAL_TABLET | ORAL | Status: DC

## 2014-01-01 NOTE — Telephone Encounter (Signed)
Last office visit 12/14/2013.  Ok to refill?

## 2014-01-04 ENCOUNTER — Encounter: Payer: Self-pay | Admitting: Radiology

## 2014-01-04 ENCOUNTER — Telehealth: Payer: Self-pay | Admitting: *Deleted

## 2014-01-04 MED ORDER — GEMFIBROZIL 600 MG PO TABS: ORAL_TABLET | ORAL | Status: DC

## 2014-01-04 NOTE — Telephone Encounter (Signed)
Received a faxed refill request for Victoza 2-pak 18 mg/92ml pen, inject 0.6 mg into the skin daily for one week, then increase to 1.2 mg daily. This is not on the medication list. Please advise.

## 2014-01-04 NOTE — Telephone Encounter (Signed)
Refill as requested. Pt is on this med.

## 2014-01-05 ENCOUNTER — Other Ambulatory Visit (INDEPENDENT_AMBULATORY_CARE_PROVIDER_SITE_OTHER): Payer: Medicare Other

## 2014-01-05 DIAGNOSIS — E119 Type 2 diabetes mellitus without complications: Secondary | ICD-10-CM

## 2014-01-05 LAB — HEMOGLOBIN A1C: HEMOGLOBIN A1C: 8.2 % — AB (ref 4.6–6.5)

## 2014-01-07 ENCOUNTER — Telehealth: Payer: Self-pay | Admitting: Family Medicine

## 2014-01-07 MED ORDER — LIRAGLUTIDE 18 MG/3ML ~~LOC~~ SOPN: 1.8000 mg | PEN_INJECTOR | Freq: Every day | SUBCUTANEOUS | Status: DC

## 2014-01-07 NOTE — Telephone Encounter (Signed)
Message copied by Jinny Sanders on Thu Jan 07, 2014  2:18 PM ------      Message from: Carter Kitten      Created: Wed Jan 06, 2014 10:57 AM       Mrs. Fredman notified as instructed by telephone.  She is willing to increase her Victoza to max dose.  Please send in prescription to [facility]. ------

## 2014-01-07 NOTE — Addendum Note (Signed)
Addended by: Eliezer Lofts E on: 01/07/2014 02:18 PM   Modules accepted: Orders

## 2014-01-22 ENCOUNTER — Other Ambulatory Visit: Payer: Self-pay

## 2014-01-22 MED ORDER — CYCLOBENZAPRINE HCL 10 MG PO TABS
ORAL_TABLET | ORAL | Status: DC
Start: 2014-01-22 — End: 2014-02-18

## 2014-01-22 NOTE — Telephone Encounter (Signed)
Pt left v/m requesting refill cyclobenzaprine to [facility].Please advise.

## 2014-01-25 ENCOUNTER — Encounter: Payer: Self-pay | Admitting: Family Medicine

## 2014-01-25 NOTE — Telephone Encounter (Signed)
Pt request status of refill;spoke with Lovena Le at pharmacy and rx ready for pick up since 01/22/14. Pt voiced understanding.Marland Kitchen

## 2014-01-28 ENCOUNTER — Other Ambulatory Visit: Payer: Self-pay | Admitting: *Deleted

## 2014-01-28 MED ORDER — PIOGLITAZONE HCL 45 MG PO TABS: ORAL_TABLET | ORAL | Status: DC

## 2014-02-15 ENCOUNTER — Other Ambulatory Visit: Payer: Self-pay | Admitting: *Deleted

## 2014-02-15 MED ORDER — IBUPROFEN 800 MG PO TABS: ORAL_TABLET | ORAL | Status: DC

## 2014-02-15 NOTE — Telephone Encounter (Signed)
Received faxed refill request from pharmacy. Last refill 01/01/14 #60/1 refill, last office visit 12/14/13. See allergy/contraindictaion. Is it okay to refill medication?

## 2014-02-18 ENCOUNTER — Other Ambulatory Visit: Payer: Self-pay | Admitting: Family Medicine

## 2014-02-18 MED ORDER — ALPRAZOLAM 0.5 MG PO TABS: ORAL_TABLET | ORAL | Status: DC

## 2014-02-18 MED ORDER — CYCLOBENZAPRINE HCL 10 MG PO TABS
ORAL_TABLET | ORAL | Status: DC
Start: 2014-02-18 — End: 2014-03-16

## 2014-02-18 NOTE — Telephone Encounter (Signed)
Pt called for status of refill on alprazolam and cyclobenzaprine; advised pt done.

## 2014-02-18 NOTE — Telephone Encounter (Signed)
Alprazolam called to [facility]. 

## 2014-02-18 NOTE — Telephone Encounter (Signed)
Fax refill request, please advise  

## 2014-03-08 ENCOUNTER — Telehealth: Payer: Self-pay | Admitting: Family Medicine

## 2014-03-08 DIAGNOSIS — E119 Type 2 diabetes mellitus without complications: Secondary | ICD-10-CM

## 2014-03-08 DIAGNOSIS — I10 Essential (primary) hypertension: Secondary | ICD-10-CM

## 2014-03-08 DIAGNOSIS — E785 Hyperlipidemia, unspecified: Secondary | ICD-10-CM

## 2014-03-08 NOTE — Telephone Encounter (Signed)
Message copied by Jinny Sanders on Mon Mar 08, 2014 11:24 PM ------      Message from: Ellamae Sia      Created: Thu Feb 25, 2014  2:40 PM      Regarding: Lab orders for Tuesday, 5.5.15       Lab orders for a 3 month f/u ------

## 2014-03-09 ENCOUNTER — Other Ambulatory Visit (INDEPENDENT_AMBULATORY_CARE_PROVIDER_SITE_OTHER): Payer: Medicare Other

## 2014-03-09 DIAGNOSIS — I10 Essential (primary) hypertension: Secondary | ICD-10-CM

## 2014-03-09 DIAGNOSIS — E119 Type 2 diabetes mellitus without complications: Secondary | ICD-10-CM

## 2014-03-09 DIAGNOSIS — E785 Hyperlipidemia, unspecified: Secondary | ICD-10-CM

## 2014-03-09 LAB — COMPREHENSIVE METABOLIC PANEL
ALK PHOS: 158 U/L — AB (ref 39–117)
ALT: 20 U/L (ref 0–35)
AST: 23 U/L (ref 0–37)
Albumin: 3.3 g/dL — ABNORMAL LOW (ref 3.5–5.2)
BILIRUBIN TOTAL: 0.1 mg/dL — AB (ref 0.2–1.2)
BUN: 21 mg/dL (ref 6–23)
CO2: 26 mEq/L (ref 19–32)
Calcium: 9.2 mg/dL (ref 8.4–10.5)
Chloride: 101 mEq/L (ref 96–112)
Creatinine, Ser: 0.8 mg/dL (ref 0.4–1.2)
GFR: 78.37 mL/min (ref >60.00)
GLUCOSE: 347 mg/dL — AB (ref 70–99)
POTASSIUM: 4.6 meq/L (ref 3.5–5.1)
Sodium: 134 mEq/L — ABNORMAL LOW (ref 135–145)
Total Protein: 6.9 g/dL (ref 6.0–8.3)

## 2014-03-09 LAB — LIPID PANEL
CHOLESTEROL: 153 mg/dL (ref 0–200)
HDL: 36.1 mg/dL — ABNORMAL LOW (ref >39.00)
LDL Cholesterol: 79 mg/dL (ref 0–99)
Total CHOL/HDL Ratio: 4
Triglycerides: 188 mg/dL — ABNORMAL HIGH (ref 0.0–149.0)
VLDL: 37.6 mg/dL (ref 0.0–40.0)

## 2014-03-09 LAB — HEMOGLOBIN A1C: HEMOGLOBIN A1C: 9.7 % — AB (ref 4.6–6.5)

## 2014-03-16 ENCOUNTER — Ambulatory Visit (INDEPENDENT_AMBULATORY_CARE_PROVIDER_SITE_OTHER): Payer: Medicare Other | Admitting: Family Medicine

## 2014-03-16 ENCOUNTER — Other Ambulatory Visit: Payer: Self-pay | Admitting: *Deleted

## 2014-03-16 ENCOUNTER — Encounter: Payer: Self-pay | Admitting: Family Medicine

## 2014-03-16 VITALS — BP 142/80 | HR 98 | Temp 97.9°F | Ht 60.0 in | Wt 226.8 lb

## 2014-03-16 DIAGNOSIS — I10 Essential (primary) hypertension: Secondary | ICD-10-CM

## 2014-03-16 DIAGNOSIS — E785 Hyperlipidemia, unspecified: Secondary | ICD-10-CM

## 2014-03-16 DIAGNOSIS — IMO0001 Reserved for inherently not codable concepts without codable children: Secondary | ICD-10-CM

## 2014-03-16 DIAGNOSIS — E1165 Type 2 diabetes mellitus with hyperglycemia: Principal | ICD-10-CM

## 2014-03-16 LAB — HM DIABETES FOOT EXAM

## 2014-03-16 MED ORDER — CYCLOBENZAPRINE HCL 10 MG PO TABS: ORAL_TABLET | ORAL | Status: DC

## 2014-03-16 MED ORDER — IBUPROFEN 800 MG PO TABS: ORAL_TABLET | ORAL | Status: DC

## 2014-03-16 MED ORDER — ALPRAZOLAM 0.5 MG PO TABS: ORAL_TABLET | ORAL | Status: DC

## 2014-03-16 NOTE — Assessment & Plan Note (Signed)
Poor control despite actos, victoza. She states she has really been eating poorly. She will get back on track and we will recheck labs in 3 months.

## 2014-03-16 NOTE — Progress Notes (Signed)
Pre visit review using our clinic review tool, if applicable. No additional management support is needed unless otherwise documented below in the visit note. 

## 2014-03-16 NOTE — Assessment & Plan Note (Signed)
LDL at goal on current meds. Trig elevated with recent poor diet.

## 2014-03-16 NOTE — Progress Notes (Signed)
53 year old female presents for 3 month DM follow up.   Diabetes: Worsened control  Given she has started eating frozen yogurt every night.  She has been doing better before this. She has stopped this 5 days ago On actos (max) and on max glipizide.  Using victoza 1.8 mg daily.    Wt Readings from Last 3 Encounters:  03/16/14 226 lb 12 oz (102.853 kg)  12/14/13 237 lb 8 oz (107.729 kg)  08/28/13 231 lb 4 oz (104.894 kg)    Lab Results  Component Value Date   HGBA1C 9.7* 03/09/2014                  Using medications without difficulties:None  Hypoglycemic episodes:None  Hyperglycemic episodes:yes Feet problems:None  Blood Sugars averaging: FBS 156, (223 he AM after yogurt  eye exam within last year: None                        Hypertension: previoiusly at goal on no medication.     BP Readings from Last 3 Encounters:     BP Readings from Last 3 Encounters:  03/16/14 142/80  12/14/13 124/70  08/28/13 138/68                  Using medication without problems or lightheadedness: None  Chest pain with exertion:None  Edema:None  Short of breath:None  Average home BPs: not checking  Other issues:   Elevated Cholesterol: Good control LDL and trigs on gemfibrozil, fish oils      Lab Results  Component Value Date   CHOL 153 03/09/2014   HDL 36.10* 03/09/2014   LDLCALC 79 03/09/2014   LDLDIRECT 59.4 09/26/2009   TRIG 188.0* 03/09/2014   CHOLHDL 4 03/09/2014                                                       Using medications without problems:none  Muscle aches: None  Diet compliance: Good  Exercise: Walking  Other complaints:     Started on gabapentin and meloxicam for sciatica by Dr. Jacelyn Grip. Has stopped ibuprofen daily.  Review of Systems  Constitutional: Negative for fever and fatigue.  HENT: Positive for congestion. Negative for ear pain, sneezing and ear discharge.  Eyes: Negative for pain and redness.  Respiratory: Positive for cough. Negative for chest  tightness and shortness of breath.  Cardiovascular: Negative for chest pain, palpitations and leg swelling.  Gastrointestinal: Negative for abdominal pain.  Genitourinary: Negative for dysuria.  Objective:  Physical Exam  Constitutional: Vital signs are normal. She appears well-developed and well-nourished. Obese She is cooperative. Non-toxic appearance. She does not appear ill. No distress.  HENT:  Head: Normocephalic.  Right Ear: Hearing, tympanic membrane, external ear and ear canal normal. Tympanic membrane is not erythematous, not retracted and not bulging.  Left Ear: Hearing, tympanic membrane, external ear and ear canal normal. Tympanic membrane is not erythematous, not retracted and not bulging.  Nose: Positive mucosal edema and rhinorrhea. Right sinus exhibits no maxillary sinus tenderness and no frontal sinus tenderness. Left sinus exhibits no maxillary sinus tenderness and no frontal sinus tenderness.  Mouth/Throat: Uvula is midline, oropharynx is clear and moist and mucous membranes are normal.  Eyes: Conjunctivae clear, EOM and lids are normal. Pupils are equal, round, and reactive  to light. No foreign bodies found.  Neck: Trachea normal and normal range of motion. Neck supple. Carotid bruit is not present. No mass and no thyromegaly present.  Cardiovascular: Normal rate, regular rhythm, S1 normal, S2 normal, normal heart sounds, intact distal pulses and normal pulses. Exam reveals no gallop and no friction rub.  No murmur heard.  Pulmonary/Chest: Effort normal and breath sounds normal. Not tachypneic. No respiratory distress. She has no decreased breath sounds. She has no wheezes. She has no rhonchi. She has no rales.  Abdominal: Soft. Normal appearance and bowel sounds are normal. There is no tenderness.  Neurological: She is alert.  Skin: Skin is warm, dry and intact. No rash noted.  Psychiatric: Her speech is normal and behavior is normal. Judgment and thought content normal.  Her mood appears not anxious. Cognition and memory are normal. She does not exhibit a depressed mood.  Diabetic foot exam:  Normal inspection  No skin breakdown  No calluses  Normal DP pulses  Normal sensation to light touch and monofilament  Nails normal

## 2014-03-16 NOTE — Patient Instructions (Addendum)
She is going to get back on track with diet change. Increase exercise as tolerated. Follow up CPX in 3 months with fasting labs prior. Schedule yearly eye exam.

## 2014-03-16 NOTE — Assessment & Plan Note (Signed)
Borderline control. Continue to follow. MAy need to restart BP medication if continuing high.

## 2014-03-17 ENCOUNTER — Telehealth: Payer: Self-pay | Admitting: Family Medicine

## 2014-03-17 NOTE — Telephone Encounter (Signed)
Relevant patient education mailed to patient.  

## 2014-03-18 ENCOUNTER — Other Ambulatory Visit: Payer: Self-pay | Admitting: *Deleted

## 2014-03-18 MED ORDER — GLIPIZIDE 10 MG PO TABS: ORAL_TABLET | ORAL | Status: DC

## 2014-03-19 ENCOUNTER — Emergency Department (HOSPITAL_COMMUNITY)
Admission: EM | Admit: 2014-03-19 | Discharge: 2014-03-19 | Disposition: A | Discharge status: Home / Self Care | Payer: Medicare Other | Attending: Emergency Medicine | Admitting: Emergency Medicine

## 2014-03-19 ENCOUNTER — Encounter (HOSPITAL_COMMUNITY): Payer: Self-pay | Admitting: Emergency Medicine

## 2014-03-19 DIAGNOSIS — E119 Type 2 diabetes mellitus without complications: Secondary | ICD-10-CM | POA: Insufficient documentation

## 2014-03-19 DIAGNOSIS — K5289 Other specified noninfective gastroenteritis and colitis: Secondary | ICD-10-CM | POA: Insufficient documentation

## 2014-03-19 DIAGNOSIS — E785 Hyperlipidemia, unspecified: Secondary | ICD-10-CM | POA: Insufficient documentation

## 2014-03-19 DIAGNOSIS — M1990 Unspecified osteoarthritis, unspecified site: Secondary | ICD-10-CM | POA: Insufficient documentation

## 2014-03-19 DIAGNOSIS — F329 Major depressive disorder, single episode, unspecified: Secondary | ICD-10-CM | POA: Insufficient documentation

## 2014-03-19 DIAGNOSIS — I10 Essential (primary) hypertension: Secondary | ICD-10-CM | POA: Insufficient documentation

## 2014-03-19 DIAGNOSIS — K529 Noninfective gastroenteritis and colitis, unspecified: Secondary | ICD-10-CM

## 2014-03-19 DIAGNOSIS — R61 Generalized hyperhidrosis: Secondary | ICD-10-CM | POA: Insufficient documentation

## 2014-03-19 DIAGNOSIS — Z87448 Personal history of other diseases of urinary system: Secondary | ICD-10-CM | POA: Insufficient documentation

## 2014-03-19 DIAGNOSIS — F17200 Nicotine dependence, unspecified, uncomplicated: Secondary | ICD-10-CM | POA: Insufficient documentation

## 2014-03-19 DIAGNOSIS — K219 Gastro-esophageal reflux disease without esophagitis: Secondary | ICD-10-CM | POA: Insufficient documentation

## 2014-03-19 DIAGNOSIS — Z791 Long term (current) use of non-steroidal anti-inflammatories (NSAID): Secondary | ICD-10-CM | POA: Insufficient documentation

## 2014-03-19 DIAGNOSIS — Z87442 Personal history of urinary calculi: Secondary | ICD-10-CM | POA: Insufficient documentation

## 2014-03-19 DIAGNOSIS — Z880 Allergy status to penicillin: Secondary | ICD-10-CM | POA: Insufficient documentation

## 2014-03-19 DIAGNOSIS — F411 Generalized anxiety disorder: Secondary | ICD-10-CM | POA: Insufficient documentation

## 2014-03-19 DIAGNOSIS — J45909 Unspecified asthma, uncomplicated: Secondary | ICD-10-CM | POA: Insufficient documentation

## 2014-03-19 DIAGNOSIS — F3289 Other specified depressive episodes: Secondary | ICD-10-CM | POA: Insufficient documentation

## 2014-03-19 DIAGNOSIS — Z79899 Other long term (current) drug therapy: Secondary | ICD-10-CM | POA: Insufficient documentation

## 2014-03-19 DIAGNOSIS — Z7982 Long term (current) use of aspirin: Secondary | ICD-10-CM | POA: Insufficient documentation

## 2014-03-19 DIAGNOSIS — R011 Cardiac murmur, unspecified: Secondary | ICD-10-CM | POA: Insufficient documentation

## 2014-03-19 DIAGNOSIS — R000 Tachycardia, unspecified: Secondary | ICD-10-CM | POA: Insufficient documentation

## 2014-03-19 DIAGNOSIS — R945 Abnormal results of liver function studies: Secondary | ICD-10-CM

## 2014-03-19 DIAGNOSIS — R7989 Other specified abnormal findings of blood chemistry: Secondary | ICD-10-CM | POA: Insufficient documentation

## 2014-03-19 LAB — URINALYSIS, ROUTINE W REFLEX MICROSCOPIC
BILIRUBIN URINE: NEGATIVE
Glucose, UA: NEGATIVE mg/dL
Hgb urine dipstick: NEGATIVE
KETONES UR: NEGATIVE mg/dL
LEUKOCYTES UA: NEGATIVE
NITRITE: NEGATIVE
PROTEIN: NEGATIVE mg/dL
Specific Gravity, Urine: 1.011 (ref 1.005–1.030)
UROBILINOGEN UA: 0.2 mg/dL (ref 0.0–1.0)
pH: 5.5 (ref 5.0–8.0)

## 2014-03-19 LAB — COMPREHENSIVE METABOLIC PANEL
ALBUMIN: 3.5 g/dL (ref 3.5–5.2)
ALK PHOS: 180 U/L — AB (ref 39–117)
ALT: 45 U/L — AB (ref 0–35)
AST: 66 U/L — ABNORMAL HIGH (ref 0–37)
BILIRUBIN TOTAL: 0.3 mg/dL (ref 0.3–1.2)
BUN: 24 mg/dL — AB (ref 6–23)
CHLORIDE: 102 meq/L (ref 96–112)
CO2: 17 mEq/L — ABNORMAL LOW (ref 19–32)
Calcium: 9.7 mg/dL (ref 8.4–10.5)
Creatinine, Ser: 0.67 mg/dL (ref 0.50–1.10)
GFR calc Af Amer: >90 mL/min (ref >90)
GFR calc non Af Amer: >90 mL/min (ref >90)
GLUCOSE: 182 mg/dL — AB (ref 70–99)
Potassium: 3.5 mEq/L — ABNORMAL LOW (ref 3.7–5.3)
SODIUM: 140 meq/L (ref 137–147)
Total Protein: 7.5 g/dL (ref 6.0–8.3)

## 2014-03-19 LAB — CBC WITH DIFFERENTIAL/PLATELET
BASOS ABS: 0 10*3/uL (ref 0.0–0.1)
Basophils Relative: 0 % (ref 0–1)
EOS ABS: 0.1 10*3/uL (ref 0.0–0.7)
EOS PCT: 2 % (ref 0–5)
HCT: 40.1 % (ref 36.0–46.0)
Hemoglobin: 13.9 g/dL (ref 12.0–15.0)
Lymphocytes Relative: 28 % (ref 12–46)
Lymphs Abs: 2.1 10*3/uL (ref 0.7–4.0)
MCH: 30.8 pg (ref 26.0–34.0)
MCHC: 34.7 g/dL (ref 30.0–36.0)
MCV: 88.7 fL (ref 78.0–100.0)
Monocytes Absolute: 0.7 10*3/uL (ref 0.1–1.0)
Monocytes Relative: 10 % (ref 3–12)
Neutro Abs: 4.6 10*3/uL (ref 1.7–7.7)
Neutrophils Relative %: 60 % (ref 43–77)
PLATELETS: 373 10*3/uL (ref 150–400)
RBC: 4.52 MIL/uL (ref 3.87–5.11)
RDW: 13.6 % (ref 11.5–15.5)
WBC: 7.6 10*3/uL (ref 4.0–10.5)

## 2014-03-19 MED ORDER — ONDANSETRON HCL 4 MG/2ML IJ SOLN
4.0000 mg | Freq: Once | INTRAMUSCULAR | Status: AC
  Administered 2014-03-19: 4 mg via INTRAVENOUS
  Filled 2014-03-19: qty 2

## 2014-03-19 MED ORDER — SODIUM CHLORIDE 0.9 % IV BOLUS (SEPSIS)
1000.0000 mL | Freq: Once | INTRAVENOUS | Status: AC
  Administered 2014-03-19: 1000 mL via INTRAVENOUS

## 2014-03-19 NOTE — ED Notes (Signed)
Pt in the bathroom at this time.  

## 2014-03-19 NOTE — ED Notes (Signed)
Pt reports on Tuesday she started to get abd pain along with n/v/d. sts it is starting to improve but her diarrhea is still going on and is concerned because she has diabetes and her blood sugars have been running a little high at home. sts she isn't able to eat much d/t the nausea. sts vomited X 1 this morning. Recently babysat for her grandkids that had the same symptoms and now husband has the same symptoms. Nad, skin warm and dry, resp e/u.

## 2014-03-19 NOTE — Discharge Instructions (Signed)
Call for a follow up appointment with a Family or Primary Care Provider, for further evaluation of your diarrhea and a repeat testing of your liver function tests. Return if Symptoms worsen, he develop fever, chills, vomiting, and worsening diarrhea.   Take medication as prescribed.  Continue a clear liquid diet today, advance to [employer] diet (Bananas, Rice, Applesauce, Toast) tomorrow, as tolerated. Drink plenty of fluids.

## 2014-03-19 NOTE — ED Provider Notes (Signed)
CSN: [id number]     Arrival date & time 03/19/14  1154 History   First MD Initiated Contact with Patient 03/19/14 1222     Chief Complaint  Patient presents with  . Diarrhea     (Consider location/radiation/quality/duration/timing/severity/associated sxs/prior Treatment) HPI Comments: The patient is a 53 year old female with past medical history of DM, reflux, renal calculi, hypertension, fibromyalgia, presenting to the emergency department with chief complaint of diarrhea for 3 days.  She reports approximately 10 episodes of diarrhea in the past 24 hours.  The patient reports loose stool, improving today. She reports nausea with emesis 3 days ago, self resolved. Able to tolerate chicken noodle soup yesterday.  She reports taking Pepto-Bismol last night with partial resolution of symptoms. Reports increase in gastric sounds. Sick contacts include young family members with a GI viral illness, symptoms nausea, vomiting, diarrhea. Her husband is also in the ED for vomiting, diarrhea and nausea, symptom onset 1 day. Denies recent antibiotics.  Last colonoscopy 2 years ago, negative.PCP: Eliezer Lofts, MD   Patient is a 53 y.o. female presenting with diarrhea. The history is provided by the patient. No language interpreter was used.  Diarrhea Associated symptoms: abdominal pain and vomiting   Associated symptoms: no fever     Past Medical History  Diagnosis Date  . Allergic rhinitis, cause unspecified   . Anxiety state, unspecified   . Extrinsic asthma with exacerbation   . Backache, unspecified   . Depressive disorder, not elsewhere classified   . Type II or unspecified type diabetes mellitus without mention of complication, not stated as uncontrolled   . Esophageal reflux   . Other and unspecified hyperlipidemia   . Osteoarthrosis, unspecified whether generalized or localized, unspecified site   . Other screening mammogram   . Routine gynecological examination   . Unspecified sleep apnea    . Tobacco use disorder   . Personal history of unspecified urinary disorder   . Routine general medical examination at a health care facility   . Heart murmur   . Hypertension   . Calculus of kidney   . Fibromyalgia    Past Surgical History  Procedure Laterality Date  . Appendectomy  1973  . Cholecystectomy  08/2006  . Transthoracic echocardiogram  02/2011    mild LVH, nl EF, mild diastolic dysfunction, no wall motion abnl   Family History  Problem Relation Age of Onset  . Stroke Father   . Colon cancer Cousin    History  Substance Use Topics  . Smoking status: Current Every Day Smoker -- 0.50 packs/day for 35 years    Types: Cigarettes  . Smokeless tobacco: Never Used  . Alcohol Use: Not on file   OB History   Grav Para Term Preterm Abortions TAB SAB Ect Mult Living                 Review of Systems  Constitutional: Negative for fever.  Gastrointestinal: Positive for vomiting, abdominal pain and diarrhea. Negative for nausea, constipation, blood in stool and anal bleeding.  Genitourinary: Negative for dysuria.      Allergies  Benazepril; Aspirin; Diclofenac sodium; Fluticasone-salmeterol; Nsaids; and Penicillins  Home Medications   Prior to Admission medications   Medication Sig Start Date End Date Taking? Authorizing Provider  albuterol (PROVENTIL HFA;VENTOLIN HFA) 108 (90 BASE) MCG/ACT inhaler INHALE 2 PUFFS INTO THE LUNGS EVERY 4 (FOUR) HOURS AS NEEDED FOR WHEEZING. 03/10/13   Amy Cletis Athens, MD  ALPRAZolam (XANAX) 0.5 MG tablet TAKE 1 TABLET  3 TIMES A DAY 03/16/14   Amy E [name], MD  amitriptyline (ELAVIL) 50 MG tablet Take 50 mg by mouth at bedtime. 11/04/13   Jinny Sanders, MD  aspirin EC 81 MG EC tablet Take 1 tablet (81 mg total) by mouth daily. 02/01/11   Ria Bush, MD  calcium elemental as carbonate (ANTACID ULTRA STRENGTH) 400 MG tablet Chew 1,000 mg by mouth as needed. heartburn    Historical Provider, MD  chlorpheniramine (CHLOR-TRIMETON) 4 MG  tablet Take 4 mg by mouth 2 (two) times daily as needed for allergies.    Historical Provider, MD  cyclobenzaprine (FLEXERIL) 10 MG tablet TAKE 1 TABLET (10 MG TOTAL) BY MOUTH 2 (TWO) TIMES DAILY. 03/16/14   Amy Cletis Athens, MD  fexofenadine (ALLEGRA) 180 MG tablet Take 180 mg by mouth daily.    Historical Provider, MD  gabapentin (NEURONTIN) 300 MG capsule Take 300 mg by mouth 2 (two) times daily.    Historical Provider, MD  gemfibrozil (LOPID) 600 MG tablet TAKE 1 TABLET BY MOUTH TWICE A DAY 01/04/14   Amy E Bedsole, MD  glipiZIDE (GLUCOTROL) 10 MG tablet TAKE 1 TABLET (10 MG TOTAL) BY MOUTH 2 (TWO) TIMES DAILY BEFORE A MEAL. 03/18/14   Amy Cletis Athens, MD  guaifenesin (ROBITUSSIN) 100 MG/5ML syrup Take 200 mg by mouth 3 (three) times daily as needed for cough.    Historical Provider, MD  HYDROcodone-acetaminophen (NORCO/VICODIN) 5-325 MG per tablet Take 1 tablet by mouth 2 (two) times daily. Prescribed by Dr. Mina Marble per [facility]    Historical Provider, MD  ibuprofen (ADVIL,MOTRIN) 800 MG tablet TAKE 1 TABLET (800 MG TOTAL) BY MOUTH EVERY 8 (EIGHT) HOURS AS NEEDED FOR PAIN. 03/16/14   Amy Cletis Athens, MD  Liraglutide 18 MG/3ML SOPN Inject 1.8 mg into the skin daily. 01/07/14   Amy Cletis Athens, MD  meloxicam (MOBIC) 15 MG tablet Take 15 mg by mouth daily.    Historical Provider, MD  metFORMIN (GLUCOPHAGE) 1000 MG tablet Take 1 tablet (1,000 mg total) by mouth 2 (two) times daily with a meal. 11/16/13   Amy E Bedsole, MD  pioglitazone (ACTOS) 45 MG tablet TAKE 1 TABLET (45 MG TOTAL) BY MOUTH DAILY. 01/28/14   Amy Cletis Athens, MD  SYMBICORT 80-4.5 MCG/ACT inhaler INHALE 2 PUFFS INTO THE LUNGS 2 (TWO) TIMES DAILY. 07/06/13   Amy E [name], MD   BP 145/76  Pulse 115  Temp(Src) 97.9 F (36.6 C) (Oral)  Resp 16  Ht 5' (1.524 m)  Wt 215 lb (97.523 kg)  BMI 41.99 kg/m2  SpO2 96% Physical Exam  Nursing note and vitals reviewed. Constitutional: She is oriented to person, place, and time. She appears  well-developed and well-nourished.  Non-toxic appearance. She does not have a sickly appearance. She does not appear ill. No distress.  HENT:  Head: Normocephalic and atraumatic.  Eyes: EOM are normal. Pupils are equal, round, and reactive to light. Right eye exhibits no discharge. Left eye exhibits no discharge. No scleral icterus.  Neck: Neck supple.  Cardiovascular: Regular rhythm.  Tachycardia present.   Pulmonary/Chest: Effort normal and breath sounds normal. No respiratory distress. She has no wheezes. She has no rales.  Abdominal: Soft. Normal appearance. There is tenderness in the left lower quadrant. There is no rigidity, no rebound, no guarding, no CVA tenderness, no tenderness at McBurney's point and negative Murphy's sign.  Minimal LLQ tenderness.  Musculoskeletal:  Moves all 4 extremities.   Neurological: She is alert and oriented  to person, place, and time.  Skin: Skin is warm. She is diaphoretic.  Psychiatric: She has a normal mood and affect. Her behavior is normal.    ED Course  Procedures (including critical care time) Labs Review Labs Reviewed  COMPREHENSIVE METABOLIC PANEL - Abnormal; Notable for the following:    Potassium 3.5 (*)    CO2 17 (*)    Glucose, Bld 182 (*)    BUN 24 (*)    AST 66 (*)    ALT 45 (*)    Alkaline Phosphatase 180 (*)    All other components within normal limits  CLOSTRIDIUM DIFFICILE BY PCR  CBC WITH DIFFERENTIAL  URINALYSIS, ROUTINE W REFLEX MICROSCOPIC    Imaging Review No results found.   EKG Interpretation None      MDM   Final diagnoses:  Gastroenteritis  Abnormal liver function test  Patient with likely viral gastroenteritis, history of family illness several days ago. Has been with similar symptoms today, also evaluated in the ED and diagnosed with a viral illness. Tachycardia likely secondary to dehydration.  1330 reeval patient sleeping in room. Denies further diarrhea episodes reports symptom improvement with  fluids. BP 130/68 HR 98 Pt reports symptom improvement no further diarrhea.  Patient reports symptom improvement, requesting to go home. Discussed lab results, and treatment plan with the patient. Return precautions given. Reports understanding and no other concerns at this time.  Patient is stable for discharge at this time.  Meds given in ED:  Medications  sodium chloride 0.9 % bolus 1,000 mL (0 mLs Intravenous Stopped 03/19/14 1357)  ondansetron (ZOFRAN) injection 4 mg (4 mg Intravenous Given 03/19/14 1356)    New Prescriptions   No medications on file        Lorrine Kin, PA-C 03/21/14 1110

## 2014-03-19 NOTE — ED Notes (Signed)
Pt made aware of need for urine and stool sample. Reports she can't provide it at this time.

## 2014-03-19 NOTE — ED Notes (Signed)
She c/o abd pain, diarrhea and fatigue since Tuesday. She has been unable to tolerate oral intake

## 2014-03-21 NOTE — ED Provider Notes (Signed)
History/physical exam/procedure(s) were performed by non-physician practitioner and as supervising physician I was immediately available for consultation/collaboration. I have reviewed all notes and am in agreement with care and plan.   Shaune Pollack, MD 03/21/14 [phone]

## 2014-04-02 ENCOUNTER — Other Ambulatory Visit: Payer: Self-pay | Admitting: *Deleted

## 2014-04-02 MED ORDER — IBUPROFEN 800 MG PO TABS: ORAL_TABLET | ORAL | Status: DC

## 2014-04-02 NOTE — Telephone Encounter (Signed)
Last office visit 03/16/2014.  Fax states prescription was filled today.  Any additional refills authorized will be placed on file until the patient is due for their next refill.  Ok to refill?

## 2014-04-12 ENCOUNTER — Other Ambulatory Visit: Payer: Self-pay

## 2014-04-12 ENCOUNTER — Other Ambulatory Visit: Payer: Self-pay | Admitting: *Deleted

## 2014-04-12 MED ORDER — INSULIN PEN NEEDLE 32G X 6 MM MISC: Status: DC

## 2014-04-12 MED ORDER — ALPRAZOLAM 0.5 MG PO TABS: ORAL_TABLET | ORAL | Status: DC

## 2014-04-12 MED ORDER — BUDESONIDE-FORMOTEROL FUMARATE 80-4.5 MCG/ACT IN AERO: INHALATION_SPRAY | RESPIRATORY_TRACT | Status: DC

## 2014-04-12 NOTE — Telephone Encounter (Signed)
Ok to ref 90, 0 ref 

## 2014-04-12 NOTE — Telephone Encounter (Signed)
Last office visit 03/16/2014.  Last refilled 03/16/2014 for #90.  Ok to refill?

## 2014-04-12 NOTE — Telephone Encounter (Signed)
Called to [facility]. 

## 2014-04-12 NOTE — Telephone Encounter (Signed)
[place] left v/m requesting refill alprazolam. Please advise.

## 2014-04-13 NOTE — Telephone Encounter (Signed)
Pt left v/m said alprazolam had not been refilled and pt wanted to know why. Spoke with Janett Billow at [facility] and alprazolam is being prepared now for pick up.pt notified.

## 2014-04-16 ENCOUNTER — Other Ambulatory Visit: Payer: Self-pay | Admitting: *Deleted

## 2014-04-16 MED ORDER — METFORMIN HCL 1000 MG PO TABS: 1000.0000 mg | ORAL_TABLET | Freq: Two times a day (BID) | ORAL | Status: DC

## 2014-04-30 ENCOUNTER — Other Ambulatory Visit: Payer: Self-pay | Admitting: *Deleted

## 2014-04-30 MED ORDER — CYCLOBENZAPRINE HCL 10 MG PO TABS: ORAL_TABLET | ORAL | Status: DC

## 2014-04-30 NOTE — Telephone Encounter (Signed)
Last office visit 03/16/2014.  Ok to refill? 

## 2014-05-17 ENCOUNTER — Other Ambulatory Visit: Payer: Self-pay | Admitting: *Deleted

## 2014-05-17 NOTE — Telephone Encounter (Signed)
Last office visit 03/16/2014.  Last refilled:  Alprazolam 04/12/2014 for #90 with no refills & Ibuprofen 04/02/2014 for #60 with 1 refill.  Ok to refill?

## 2014-05-18 MED ORDER — IBUPROFEN 800 MG PO TABS: ORAL_TABLET | ORAL | Status: DC

## 2014-05-18 MED ORDER — ALPRAZOLAM 0.5 MG PO TABS: ORAL_TABLET | ORAL | Status: DC

## 2014-05-18 NOTE — Telephone Encounter (Signed)
Alprazolam called to [facility].

## 2014-06-09 ENCOUNTER — Other Ambulatory Visit: Payer: Self-pay | Admitting: *Deleted

## 2014-06-09 MED ORDER — GEMFIBROZIL 600 MG PO TABS: ORAL_TABLET | ORAL | Status: DC

## 2014-06-11 ENCOUNTER — Telehealth: Payer: Self-pay

## 2014-06-11 ENCOUNTER — Other Ambulatory Visit: Payer: Self-pay | Admitting: *Deleted

## 2014-06-11 MED ORDER — ALPRAZOLAM 0.5 MG PO TABS: ORAL_TABLET | ORAL | Status: DC

## 2014-06-11 NOTE — Telephone Encounter (Signed)
Let pt know I appreciate her notifing me and it is okay to take pain med from dentist.

## 2014-06-11 NOTE — Telephone Encounter (Signed)
Last office visit 03/16/2014.  Last refilled 05/18/2014 for #90 with no refills.  Ok to refill?

## 2014-06-11 NOTE — Telephone Encounter (Signed)
Pt left v/m; pt saw dentist and has appt scheduled to have tooth pulled on 06/15/14. Dentist prescribed Hydrocodone apap # 20 and clindamycin. Pt wants Dr [name] to be aware dentist has prescribed pain med and request cb to let pt know Dr [name] said it is OK for her to get Hydrocodone from dentist.

## 2014-06-11 NOTE — Telephone Encounter (Signed)
Lujuana notified okay to fill prescription for pain medications from dentist.

## 2014-06-11 NOTE — Telephone Encounter (Signed)
Called to [facility]. 

## 2014-06-14 ENCOUNTER — Telehealth: Payer: Self-pay | Admitting: Family Medicine

## 2014-06-14 ENCOUNTER — Other Ambulatory Visit: Payer: Self-pay | Admitting: *Deleted

## 2014-06-14 DIAGNOSIS — E785 Hyperlipidemia, unspecified: Secondary | ICD-10-CM

## 2014-06-14 DIAGNOSIS — I10 Essential (primary) hypertension: Secondary | ICD-10-CM

## 2014-06-14 DIAGNOSIS — E1165 Type 2 diabetes mellitus with hyperglycemia: Principal | ICD-10-CM

## 2014-06-14 DIAGNOSIS — IMO0001 Reserved for inherently not codable concepts without codable children: Secondary | ICD-10-CM

## 2014-06-14 MED ORDER — CYCLOBENZAPRINE HCL 10 MG PO TABS: ORAL_TABLET | ORAL | Status: DC

## 2014-06-14 NOTE — Telephone Encounter (Signed)
Pharmacy filled this med today, but they request a new rx to keep in file until time for pt's next refill. Is this ok?

## 2014-06-14 NOTE — Telephone Encounter (Signed)
Message copied by Jinny Sanders on Mon Jun 14, 2014 10:25 PM ------      Message from: Ellamae Sia      Created: Mon Jun 07, 2014 11:46 AM      Regarding: Lab orders for Tuesday, 8.11.15       Patient is scheduled for CPX labs, please order future labs, Thanks , Terri       ------

## 2014-06-15 ENCOUNTER — Other Ambulatory Visit: Payer: Medicare Other

## 2014-06-15 MED ORDER — ALPRAZOLAM 0.5 MG PO TABS: ORAL_TABLET | ORAL | Status: DC

## 2014-06-15 NOTE — Telephone Encounter (Signed)
Pt left v/m requesting status of alprazolam and ibuprofen refill.; spoke with Amado Coe at [facility] and ibuprofen is ready for pickup but did not receive call in for alprazolam on 06/11/14.Medication phoned to [facility] as instructed; spoke with Amado Coe. Pt notified med called in.

## 2014-06-15 NOTE — Addendum Note (Signed)
Addended by: Helene Shoe on: 06/15/2014 12:10 PM   Modules accepted: Orders

## 2014-06-16 ENCOUNTER — Other Ambulatory Visit (INDEPENDENT_AMBULATORY_CARE_PROVIDER_SITE_OTHER): Payer: Medicare Other

## 2014-06-16 DIAGNOSIS — IMO0001 Reserved for inherently not codable concepts without codable children: Secondary | ICD-10-CM

## 2014-06-16 DIAGNOSIS — E1165 Type 2 diabetes mellitus with hyperglycemia: Principal | ICD-10-CM

## 2014-06-16 LAB — MICROALBUMIN / CREATININE URINE RATIO
CREATININE, U: 62.5 mg/dL
Microalb Creat Ratio: 3.8 mg/g (ref 0.0–30.0)
Microalb, Ur: 2.4 mg/dL — ABNORMAL HIGH (ref 0.0–1.9)

## 2014-06-16 LAB — LIPID PANEL
CHOLESTEROL: 157 mg/dL (ref 0–200)
HDL: 33.1 mg/dL — AB (ref >39.00)
LDL CALC: 93 mg/dL (ref 0–99)
NonHDL: 123.9
TRIGLYCERIDES: 155 mg/dL — AB (ref 0.0–149.0)
Total CHOL/HDL Ratio: 5
VLDL: 31 mg/dL (ref 0.0–40.0)

## 2014-06-16 LAB — COMPREHENSIVE METABOLIC PANEL
ALT: 14 U/L (ref 0–35)
AST: 20 U/L (ref 0–37)
Albumin: 3.3 g/dL — ABNORMAL LOW (ref 3.5–5.2)
Alkaline Phosphatase: 131 U/L — ABNORMAL HIGH (ref 39–117)
BILIRUBIN TOTAL: 0.4 mg/dL (ref 0.2–1.2)
BUN: 20 mg/dL (ref 6–23)
CHLORIDE: 103 meq/L (ref 96–112)
CO2: 24 mEq/L (ref 19–32)
CREATININE: 0.7 mg/dL (ref 0.4–1.2)
Calcium: 10 mg/dL (ref 8.4–10.5)
GFR: 91.14 mL/min (ref >60.00)
Glucose, Bld: 199 mg/dL — ABNORMAL HIGH (ref 70–99)
Potassium: 4.5 mEq/L (ref 3.5–5.1)
Sodium: 136 mEq/L (ref 135–145)
Total Protein: 7.3 g/dL (ref 6.0–8.3)

## 2014-06-16 LAB — HEMOGLOBIN A1C: Hgb A1c MFr Bld: 7.8 % — ABNORMAL HIGH (ref 4.6–6.5)

## 2014-06-22 ENCOUNTER — Encounter: Payer: Self-pay | Admitting: Family Medicine

## 2014-06-22 ENCOUNTER — Ambulatory Visit (INDEPENDENT_AMBULATORY_CARE_PROVIDER_SITE_OTHER): Payer: Medicare Other | Admitting: Family Medicine

## 2014-06-22 ENCOUNTER — Other Ambulatory Visit (HOSPITAL_COMMUNITY)
Admission: RE | Admit: 2014-06-22 | Discharge: 2014-06-22 | Disposition: A | Discharge status: Home / Self Care | Payer: Medicare Other | Source: Ambulatory Visit | Attending: Family Medicine | Admitting: Family Medicine

## 2014-06-22 ENCOUNTER — Encounter (INDEPENDENT_AMBULATORY_CARE_PROVIDER_SITE_OTHER): Payer: Self-pay

## 2014-06-22 VITALS — BP 138/76 | HR 92 | Temp 98.2°F | Ht 58.5 in | Wt 227.8 lb

## 2014-06-22 DIAGNOSIS — Z0000 Encounter for general adult medical examination without abnormal findings: Secondary | ICD-10-CM

## 2014-06-22 DIAGNOSIS — I10 Essential (primary) hypertension: Secondary | ICD-10-CM

## 2014-06-22 DIAGNOSIS — Z01419 Encounter for gynecological examination (general) (routine) without abnormal findings: Secondary | ICD-10-CM | POA: Diagnosis present

## 2014-06-22 DIAGNOSIS — Z23 Encounter for immunization: Secondary | ICD-10-CM

## 2014-06-22 DIAGNOSIS — E785 Hyperlipidemia, unspecified: Secondary | ICD-10-CM

## 2014-06-22 DIAGNOSIS — IMO0001 Reserved for inherently not codable concepts without codable children: Secondary | ICD-10-CM

## 2014-06-22 DIAGNOSIS — Z1151 Encounter for screening for human papillomavirus (HPV): Secondary | ICD-10-CM | POA: Diagnosis present

## 2014-06-22 DIAGNOSIS — E1165 Type 2 diabetes mellitus with hyperglycemia: Secondary | ICD-10-CM

## 2014-06-22 LAB — HM DIABETES FOOT EXAM

## 2014-06-22 NOTE — Addendum Note (Signed)
Addended by: Modena Nunnery on: 06/22/2014 12:30 PM   Modules accepted: Orders

## 2014-06-22 NOTE — Assessment & Plan Note (Signed)
Well controlled. Continue current medication.  

## 2014-06-22 NOTE — Progress Notes (Signed)
Subjective:    Patient ID: Sandra Ferrell, female    DOB: [birth year], 53 y.o.   MRN: [id number]  HPI  The patient is here for annual wellness exam and preventative care.     Diabetes: Improved control with increase in actos (max) and on max glipizide.  Using victoza 1.8 mg daily now on this for 4 months.  Lab Results  Component Value Date   HGBA1C 7.8* 06/16/2014  Using medications without difficulties:None  Hypoglycemic episodes:None  Hyperglycemic episodes:None  Feet problems:None  Blood Sugars averaging: FBS 120-136, 2 hour postprandial none  eye exam within last year: None  Wt Readings from Last 3 Encounters:  06/22/14 227 lb 12 oz (103.307 kg)  03/19/14 215 lb (97.523 kg)  03/16/14 226 lb 12 oz (102.853 kg)    Hypertension: At goal per [facility] < 140/90 on no medication.  BP Readings from Last 3 Encounters:  06/22/14 138/76  03/19/14 148/65  03/16/14 142/80  Using medication without problems or lightheadedness: None  Chest pain with exertion:None  Edema:None  Short of breath:None  Average home BPs: not checking  Other issues:   Elevated Cholesterol: Good control LDL and trigs on gemfibrozil, fish oils  Lab Results  Component Value Date   CHOL 157 06/16/2014   HDL 33.10* 06/16/2014   LDLCALC 93 06/16/2014   LDLDIRECT 59.4 09/26/2009   TRIG 155.0* 06/16/2014   CHOLHDL 5 06/16/2014  Using medications without problems:none  Muscle aches: None  Diet compliance: Good  Exercise: Walking  Other complaints:    Dr. Jacelyn Grip for [facility]: s/p nerve deadening procedure. Has helped some.  She refuses surgery.  Plans repeat ESI. Using norco 1 tab po once to twice daily.  Using ibuprofen every other day.  Using gabapentin 1 three times a day... Makes her very tired. She wants to decrease down to 2 tabs a day. Okay now to return to exercise.      Review of Systems  Constitutional: Negative for fever and fatigue.  HENT: Negative for ear pain.   Eyes: Negative for  pain.  Respiratory: Negative for chest tightness and shortness of breath.   Cardiovascular: Negative for chest pain, palpitations and leg swelling.  Gastrointestinal: Negative for abdominal pain.  Genitourinary: Negative for dysuria.       Objective:   Physical Exam  Constitutional: Vital signs are normal. She appears well-developed and well-nourished. She is cooperative.  Non-toxic appearance. She does not appear ill. No distress.  HENT:  Head: Normocephalic.  Right Ear: Hearing, tympanic membrane, external ear and ear canal normal.  Left Ear: Hearing, tympanic membrane, external ear and ear canal normal.  Nose: Nose normal.  Eyes: Conjunctivae, EOM and lids are normal. Pupils are equal, round, and reactive to light. Lids are everted and swept, no foreign bodies found.  Neck: Trachea normal and normal range of motion. Neck supple. Carotid bruit is not present. No mass and no thyromegaly present.  Cardiovascular: Normal rate, regular rhythm, S1 normal, S2 normal, normal heart sounds and intact distal pulses.  Exam reveals no gallop.   No murmur heard. Pulmonary/Chest: Effort normal and breath sounds normal. No respiratory distress. She has no wheezes. She has no rhonchi. She has no rales.  Abdominal: Soft. Normal appearance and bowel sounds are normal. She exhibits no distension, no fluid wave, no abdominal bruit and no mass. There is no hepatosplenomegaly. There is no tenderness. There is no rebound, no guarding and no CVA tenderness. No hernia.  Genitourinary: Vagina normal and  uterus normal. No breast swelling, tenderness, discharge or bleeding. Pelvic exam was performed with patient supine. There is no rash, tenderness or lesion on the right labia. There is no rash, tenderness or lesion on the left labia. Uterus is not enlarged and not tender. Cervix exhibits no motion tenderness, no discharge and no friability. Right adnexum displays no mass, no tenderness and no fullness. Left adnexum  displays no mass, no tenderness and no fullness.  Lymphadenopathy:    She has no cervical adenopathy.    She has no axillary adenopathy.  Neurological: She is alert. She has normal strength. No cranial nerve deficit or sensory deficit.  Skin: Skin is warm, dry and intact. No rash noted.  Psychiatric: Her speech is normal and behavior is normal. Judgment normal. Her mood appears not anxious. Cognition and memory are normal. She does not exhibit a depressed mood.          Assessment & Plan:  The patient's preventative maintenance and recommended screening tests for an annual wellness exam were reviewed in full today. Brought up to date unless services declined.  Counselled on the importance of diet, exercise, and its role in overall health and mortality. The patient's FH and SH was reviewed, including their home life, tobacco status, and drug and alcohol status.   Vaccines:Due for tdap  Colon: nml colonoscopy in 2012 Dr. Deatra Ina, repeat in 10 years  Mammo: Due   DEXA: nml 2012, repeat age 2  PAP/DVE: nml 2012, on q 3 year schedule.

## 2014-06-22 NOTE — Progress Notes (Signed)
Pre visit review using our clinic review tool, if applicable. No additional management support is needed unless otherwise documented below in the visit note. 

## 2014-06-22 NOTE — Patient Instructions (Signed)
Increase exercise as able, keep on working on diet changes. Continue victoza at current dose.  Schedule mammogram on your own.  Set up yearly eye exam.

## 2014-06-22 NOTE — Assessment & Plan Note (Signed)
Improving control with victoza! Encouraged exercise, weight loss, healthy eating habits.

## 2014-06-23 LAB — CYTOLOGY - PAP

## 2014-06-24 ENCOUNTER — Encounter: Payer: Self-pay | Admitting: *Deleted

## 2014-06-28 ENCOUNTER — Other Ambulatory Visit: Payer: Self-pay | Admitting: *Deleted

## 2014-06-28 MED ORDER — IBUPROFEN 800 MG PO TABS: ORAL_TABLET | ORAL | Status: DC

## 2014-06-28 NOTE — Telephone Encounter (Signed)
Received faxed refill request from pharmacy. Last refill 05/18/14 #60/1 refill, last office visit 06/22/14. See allergy/contraindication. Is it okay to refill medication?

## 2014-07-05 ENCOUNTER — Other Ambulatory Visit: Payer: Self-pay | Admitting: *Deleted

## 2014-07-05 MED ORDER — PIOGLITAZONE HCL 45 MG PO TABS: ORAL_TABLET | ORAL | Status: DC

## 2014-07-09 ENCOUNTER — Other Ambulatory Visit: Payer: Self-pay | Admitting: *Deleted

## 2014-07-09 NOTE — Telephone Encounter (Signed)
Last office visit 06/22/2014.  Last refilled 06/15/2014 for #90 with no refills.  Ok to refill?

## 2014-07-14 ENCOUNTER — Other Ambulatory Visit: Payer: Self-pay | Admitting: *Deleted

## 2014-07-14 NOTE — Telephone Encounter (Signed)
90x0 last filled on 06/15/14, pt's last appt was a cpx on 06/22/14.

## 2014-07-14 NOTE — Telephone Encounter (Signed)
Is it okay to go ahead and refill now?  Please advise.

## 2014-07-14 NOTE — Telephone Encounter (Signed)
Pt left v/m requesting status of alprazolam refill. Pt request cb when filled.

## 2014-07-14 NOTE — Telephone Encounter (Signed)
Pt left v/m requesting status of alprazolam refill; pt request cb.

## 2014-07-15 MED ORDER — ALPRAZOLAM 0.5 MG PO TABS: ORAL_TABLET | ORAL | Status: DC

## 2014-07-15 NOTE — Telephone Encounter (Signed)
Sandra Ferrell notified prescription has been called in to her pharmacy.

## 2014-07-15 NOTE — Addendum Note (Signed)
Addended by: Carter Kitten on: 07/15/2014 08:40 AM   Modules accepted: Orders

## 2014-07-15 NOTE — Telephone Encounter (Signed)
Called to [facility]. 

## 2014-07-15 NOTE — Telephone Encounter (Signed)
Yes, okay to refill now at #90 0RF Thanks!

## 2014-07-28 ENCOUNTER — Other Ambulatory Visit: Payer: Self-pay | Admitting: *Deleted

## 2014-07-28 NOTE — Telephone Encounter (Signed)
Received fax from [facility] the comment that this prescription was filled today.  Any refills authorized will be placed on file until patient is due for their next refill.  Last office visit 06/22/2014.  Last refilled 06/14/2014 for #60 with 1 refill.  Ok to refill?

## 2014-07-29 MED ORDER — CYCLOBENZAPRINE HCL 10 MG PO TABS: ORAL_TABLET | ORAL | Status: DC

## 2014-08-04 ENCOUNTER — Other Ambulatory Visit: Payer: Self-pay | Admitting: *Deleted

## 2014-08-04 NOTE — Telephone Encounter (Signed)
Last office visit 06/22/2014.  Last refilled 06/28/2014 for #60 with 1 refill.  Ok to refill?

## 2014-08-06 MED ORDER — IBUPROFEN 800 MG PO TABS: ORAL_TABLET | ORAL | Status: DC

## 2014-08-10 ENCOUNTER — Telehealth: Payer: Self-pay

## 2014-08-10 NOTE — Telephone Encounter (Signed)
Pt stated that she has not had a diabetic eye exam in years and plans to schedule an appt this week.

## 2014-08-11 ENCOUNTER — Other Ambulatory Visit: Payer: Self-pay | Admitting: *Deleted

## 2014-08-11 NOTE — Telephone Encounter (Signed)
Last office visit 06/22/2014.  Last refilled 07/15/2014 for #90 with no refills.  Ok to refill?

## 2014-08-12 MED ORDER — ALPRAZOLAM 0.5 MG PO TABS: ORAL_TABLET | ORAL | Status: DC

## 2014-08-12 NOTE — Telephone Encounter (Signed)
Called to [facility]. 

## 2014-08-17 ENCOUNTER — Other Ambulatory Visit: Payer: Self-pay | Admitting: *Deleted

## 2014-08-17 MED ORDER — GLIPIZIDE 10 MG PO TABS: ORAL_TABLET | ORAL | Status: DC

## 2014-08-25 ENCOUNTER — Other Ambulatory Visit: Payer: Self-pay | Admitting: *Deleted

## 2014-08-25 MED ORDER — LIRAGLUTIDE 18 MG/3ML ~~LOC~~ SOPN: 1.8000 mg | PEN_INJECTOR | Freq: Every day | SUBCUTANEOUS | Status: DC

## 2014-09-08 ENCOUNTER — Other Ambulatory Visit: Payer: Self-pay | Admitting: *Deleted

## 2014-09-08 NOTE — Telephone Encounter (Signed)
Last office visit 06/22/2014.  Last refilled 08/12/2014 for #90 with no refills.  Ok to refill?

## 2014-09-09 MED ORDER — ALPRAZOLAM 0.5 MG PO TABS: ORAL_TABLET | ORAL | Status: DC

## 2014-09-09 NOTE — Telephone Encounter (Signed)
Called to [facility]. 

## 2014-09-13 ENCOUNTER — Other Ambulatory Visit: Payer: Self-pay | Admitting: *Deleted

## 2014-09-13 MED ORDER — BUDESONIDE-FORMOTEROL FUMARATE 80-4.5 MCG/ACT IN AERO: INHALATION_SPRAY | RESPIRATORY_TRACT | Status: DC

## 2014-09-13 MED ORDER — CYCLOBENZAPRINE HCL 10 MG PO TABS: ORAL_TABLET | ORAL | Status: DC

## 2014-09-13 NOTE — Telephone Encounter (Signed)
Last office visit 06/22/2014.  Last refilled 07/29/2014 for #60 with 1 refill.  Ok to refill?

## 2014-10-04 ENCOUNTER — Other Ambulatory Visit: Payer: Self-pay | Admitting: *Deleted

## 2014-10-04 MED ORDER — METFORMIN HCL 1000 MG PO TABS: 1000.0000 mg | ORAL_TABLET | Freq: Two times a day (BID) | ORAL | Status: DC

## 2014-10-04 NOTE — Telephone Encounter (Signed)
Received a faxed refill request from pharmacy. Refill sent to pharmacy electronically.

## 2014-10-05 ENCOUNTER — Ambulatory Visit (INDEPENDENT_AMBULATORY_CARE_PROVIDER_SITE_OTHER): Payer: Medicare Other | Admitting: Family Medicine

## 2014-10-05 ENCOUNTER — Encounter: Payer: Self-pay | Admitting: Family Medicine

## 2014-10-05 VITALS — BP 140/68 | HR 101 | Temp 98.2°F | Ht 58.5 in | Wt 230.8 lb

## 2014-10-05 DIAGNOSIS — R300 Dysuria: Secondary | ICD-10-CM

## 2014-10-05 DIAGNOSIS — N3001 Acute cystitis with hematuria: Secondary | ICD-10-CM

## 2014-10-05 LAB — POCT URINALYSIS DIPSTICK
Bilirubin, UA: NEGATIVE
Ketones, UA: NEGATIVE
Nitrite, UA: NEGATIVE
Protein, UA: NEGATIVE
SPEC GRAV UA: 1.02
UROBILINOGEN UA: 0.2
pH, UA: 6

## 2014-10-05 MED ORDER — SULFAMETHOXAZOLE-TRIMETHOPRIM 800-160 MG PO TABS: 1.0000 | ORAL_TABLET | Freq: Two times a day (BID) | ORAL | Status: DC

## 2014-10-05 NOTE — Patient Instructions (Addendum)
Push fluids. Stop soda and any bladder irritants.  Rest.  Complete antibiotics.  We will call with culture results.

## 2014-10-05 NOTE — Progress Notes (Signed)
Pre visit review using our clinic review tool, if applicable. No additional management support is needed unless otherwise documented below in the visit note. 

## 2014-10-05 NOTE — Progress Notes (Signed)
   Subjective:    Patient ID: [name], female    DOB: [birth year], 53 y.o.   MRN: [id number]  HPI  53 year old female with history of frequent UTIs presents with  2 weeks off and on of lower abdominal pain. Dysuria : yes Duration of symptoms: 1 week abdominal pain: yes  fevers:non back pain: yes, but has chronic back pain. vomiting:None other concerns: Drinking water.  She has been drinking a lot soda prior to symptoms. She has used azo to help occ, helps temporarily.  Meds, vitals, and allergies reviewed.        Review of Systems    ROS: See HPI.  Otherwise negative.    Objective:   Physical Exam GEN: nad, alert and oriented HEENT: mucous membranes moist NECK: supple CV: rrr.  PULM: ctab, no inc wob ABD: soft, +bs, suprapubic area tender EXT: no edema SKIN: no acute rash BACK: no CVA pain         Assessment & Plan:

## 2014-10-06 LAB — URINE CULTURE
COLONY COUNT: NO GROWTH
Organism ID, Bacteria: NO GROWTH

## 2014-10-07 ENCOUNTER — Other Ambulatory Visit: Payer: Self-pay | Admitting: *Deleted

## 2014-10-07 NOTE — Telephone Encounter (Signed)
Last office visit 10/05/2014.  Last refilled 09/09/2014 for #90 with no refills.  Ok to refill?

## 2014-10-08 MED ORDER — ALPRAZOLAM 0.5 MG PO TABS: ORAL_TABLET | ORAL | Status: DC

## 2014-10-08 NOTE — Telephone Encounter (Signed)
Pt left vm requesting refill alprazolam to [facility].Please advise.

## 2014-10-11 NOTE — Telephone Encounter (Signed)
Called to [facility]. 

## 2014-10-11 NOTE — Telephone Encounter (Signed)
Pt request status of alprazolam refill; spoke with melika at [place] and refill was on v/m machine. Pt notified pharmacy did get refill.

## 2014-11-02 ENCOUNTER — Telehealth: Payer: Self-pay | Admitting: Family Medicine

## 2014-11-02 ENCOUNTER — Other Ambulatory Visit: Payer: Medicare Other

## 2014-11-02 DIAGNOSIS — E119 Type 2 diabetes mellitus without complications: Secondary | ICD-10-CM

## 2014-11-02 NOTE — Telephone Encounter (Signed)
-----   Message from Ellamae Sia sent at 10/26/2014  2:45 PM EST ----- Regarding: Lab orders for Tuesday, 12.29.15 Lab orders for a f/u appt

## 2014-11-08 ENCOUNTER — Other Ambulatory Visit: Payer: Self-pay | Admitting: *Deleted

## 2014-11-08 DIAGNOSIS — G5601 Carpal tunnel syndrome, right upper limb: Secondary | ICD-10-CM | POA: Diagnosis not present

## 2014-11-08 DIAGNOSIS — Z716 Tobacco abuse counseling: Secondary | ICD-10-CM | POA: Diagnosis not present

## 2014-11-08 DIAGNOSIS — F17210 Nicotine dependence, cigarettes, uncomplicated: Secondary | ICD-10-CM | POA: Diagnosis not present

## 2014-11-08 NOTE — Telephone Encounter (Signed)
Last office visit 10/05/2014.  Last refilled 10/08/2014 for #90 with no refills.  Ok to refill?

## 2014-11-09 ENCOUNTER — Encounter: Payer: Self-pay | Admitting: Family Medicine

## 2014-11-09 ENCOUNTER — Encounter: Payer: Self-pay | Admitting: *Deleted

## 2014-11-09 ENCOUNTER — Ambulatory Visit (INDEPENDENT_AMBULATORY_CARE_PROVIDER_SITE_OTHER): Payer: Medicare Other | Admitting: Family Medicine

## 2014-11-09 VITALS — BP 120/62 | HR 100 | Temp 97.5°F | Ht 58.5 in | Wt 231.5 lb

## 2014-11-09 DIAGNOSIS — Z79891 Long term (current) use of opiate analgesic: Secondary | ICD-10-CM | POA: Diagnosis not present

## 2014-11-09 DIAGNOSIS — Z79899 Other long term (current) drug therapy: Secondary | ICD-10-CM | POA: Diagnosis not present

## 2014-11-09 DIAGNOSIS — E119 Type 2 diabetes mellitus without complications: Secondary | ICD-10-CM | POA: Diagnosis not present

## 2014-11-09 DIAGNOSIS — I10 Essential (primary) hypertension: Secondary | ICD-10-CM | POA: Diagnosis not present

## 2014-11-09 LAB — HM DIABETES FOOT EXAM

## 2014-11-09 LAB — HEMOGLOBIN A1C: HEMOGLOBIN A1C: 10.4 % — AB (ref 4.6–6.5)

## 2014-11-09 MED ORDER — ALPRAZOLAM 0.5 MG PO TABS: ORAL_TABLET | ORAL | Status: DC

## 2014-11-09 NOTE — Progress Notes (Signed)
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    She is having some pain in knees.  She does have her knees go out sometimes. She is using a cane. has follow up with Ortho on 11/22/2013.  Dr. Jacelyn Grip for [facility]: s/p nerve deadening procedure. Has helped some.  She refuses surgery.  Seh is more open to this lately given this feeling of legs giving out on her. Plans repeat ESI. Using norco 1 tab po once to twice daily.  Using ibuprofen every other day.  Using gabapentin 1 three times a day... Makes her very tired. She wants to decrease down to 2 tabs a day. Okay now to return to exercise.    Diabetes: Improved control with increase in actos (max) and on max glipizide.  Using victoza 1.8 mg daily now on this for 7 months.  She has not been able to stick to diet over the holiday's  Due for re-eval. Lab Results  Component Value Date   HGBA1C 7.8* 06/16/2014  Using medications without difficulties:None  Hypoglycemic episodes:None  Hyperglycemic episodes:None  Feet problems:None  Blood Sugars averaging: FBS 179, 2 hour postprandial none  eye exam within last year: None , has appt next week. Wt Readings from Last 3 Encounters:  11/09/14 231 lb 8 oz (105.008 kg)  10/05/14 230 lb 12 oz (104.668 kg)  06/22/14 227 lb 12 oz (103.307 kg)    Hypertension: At goal per [facility] < 140/90 on no medication.  BP Readings from Last 3 Encounters:  11/09/14 120/62  10/05/14 140/68  06/22/14 138/76  Using medication without problems or lightheadedness: None  Chest pain with exertion:None  Edema:None  Short of breath:None  Average home BPs: not checking  Other issues:       Review of Systems  Constitutional: Negative for fever and fatigue.  HENT: Negative for ear pain.  Eyes: Negative for pain.  Respiratory: Negative for chest tightness and shortness of  breath.  Cardiovascular: Negative for chest pain, palpitations and leg swelling.  Gastrointestinal: Negative for abdominal pain.  Genitourinary: Negative for dysuria.       Objective:   Physical Exam  Constitutional: Vital signs are normal. She appears well-developed and well-nourished. She is cooperative. Non-toxic appearance. She does not appear ill. No distress.  HENT:  Head: Normocephalic.  Right Ear: Hearing, tympanic membrane, external ear and ear canal normal.  Left Ear: Hearing, tympanic membrane, external ear and ear canal normal.  Nose: Nose normal.  Eyes: Conjunctivae, EOM and lids are normal. Pupils are equal, round, and reactive to light. Lids are everted and swept, no foreign bodies found.  Neck: Trachea normal and normal range of motion. Neck supple. Carotid bruit is not present. No mass and no thyromegaly present.  Cardiovascular: Normal rate, regular rhythm, S1 normal, S2 normal, normal heart sounds and intact distal pulses. Exam reveals no gallop.  No murmur heard. Pulmonary/Chest: Effort normal and breath sounds normal. No respiratory distress. She has no wheezes. She has no rhonchi. She has no rales.  Abdominal: Soft. Normal appearance and bowel sounds are normal. She exhibits no distension, no fluid wave, no abdominal bruit and no mass. There is no hepatosplenomegaly. There is no tenderness. There is no rebound, no guarding and no CVA tenderness. No hernia.  Lymphadenopathy:   She has no cervical adenopathy.   She has no axillary adenopathy.  Neurological: She  is alert. She has normal strength. No cranial nerve deficit or sensory deficit.  Skin: Skin is warm, dry and intact. No rash noted.  Psychiatric: Her speech is normal and behavior is normal. Judgment normal. Her mood appears not anxious. Cognition and memory are normal. She does not exhibit a depressed mood.   Diabetic foot exam: Normal inspection No skin breakdown No calluses  Normal DP  pulses Normal sensation to light touch and monofilament Nails normal

## 2014-11-09 NOTE — Addendum Note (Signed)
Addended by: Ellamae Sia on: 11/09/2014 03:01 PM   Modules accepted: Orders

## 2014-11-09 NOTE — Assessment & Plan Note (Signed)
Worse control with poor lifestyle habits.  Get back on track, continue victoza , actos , glipizide

## 2014-11-09 NOTE — Telephone Encounter (Signed)
Called to [facility].

## 2014-11-09 NOTE — Assessment & Plan Note (Signed)
Well controlled. Continue current medication.  

## 2014-11-09 NOTE — Patient Instructions (Addendum)
Stop at lab on way out. Get back on track with healthy low carb eating. Exercise as able.

## 2014-11-09 NOTE — Progress Notes (Signed)
Pre visit review using our clinic review tool, if applicable. No additional management support is needed unless otherwise documented below in the visit note. 

## 2014-11-10 ENCOUNTER — Telehealth: Payer: Self-pay | Admitting: Family Medicine

## 2014-11-10 NOTE — Telephone Encounter (Signed)
emmi mailed  °

## 2014-11-15 ENCOUNTER — Other Ambulatory Visit: Payer: Self-pay | Admitting: *Deleted

## 2014-11-15 MED ORDER — IBUPROFEN 800 MG PO TABS: ORAL_TABLET | ORAL | Status: DC

## 2014-11-15 NOTE — Telephone Encounter (Signed)
Received refill request electronically from pharmacy. Last refill 08/06/14 #60/1 refill, last office visit 11/09/14. See allergy/contraindication. Is it okay to refill medication?

## 2014-11-22 DIAGNOSIS — G5601 Carpal tunnel syndrome, right upper limb: Secondary | ICD-10-CM | POA: Diagnosis not present

## 2014-11-25 ENCOUNTER — Encounter: Payer: Self-pay | Admitting: Family Medicine

## 2014-11-29 ENCOUNTER — Other Ambulatory Visit: Payer: Self-pay | Admitting: *Deleted

## 2014-11-29 MED ORDER — CYCLOBENZAPRINE HCL 10 MG PO TABS: ORAL_TABLET | ORAL | Status: DC

## 2014-11-29 NOTE — Telephone Encounter (Signed)
Pt left message on voicemail that she needs cyclobenzaprine rx sent in to pharmacy. She says [facility] said they have tried to contact our office several times and haven't gotten a response. She is out of med and request it be called in asap.

## 2014-11-29 NOTE — Telephone Encounter (Signed)
Last office visit 11/09/2014.  Last refilled 09/23/2014 for #60 with 1 refill.  Ok to refill?

## 2014-12-01 ENCOUNTER — Other Ambulatory Visit: Payer: Self-pay | Admitting: *Deleted

## 2014-12-01 MED ORDER — GEMFIBROZIL 600 MG PO TABS: ORAL_TABLET | ORAL | Status: DC

## 2014-12-07 DIAGNOSIS — G5601 Carpal tunnel syndrome, right upper limb: Secondary | ICD-10-CM | POA: Diagnosis not present

## 2014-12-07 DIAGNOSIS — R202 Paresthesia of skin: Secondary | ICD-10-CM | POA: Diagnosis not present

## 2014-12-09 ENCOUNTER — Other Ambulatory Visit: Payer: Self-pay | Admitting: *Deleted

## 2014-12-09 NOTE — Telephone Encounter (Signed)
Pt left v/m requesting status of alprazolam refill. 

## 2014-12-09 NOTE — Telephone Encounter (Signed)
Last office visit 11/09/2014.  Last refilled  11/09/2014 for #90 with no refills.  Ok to refill?

## 2014-12-10 DIAGNOSIS — M542 Cervicalgia: Secondary | ICD-10-CM | POA: Diagnosis not present

## 2014-12-10 MED ORDER — ALPRAZOLAM 0.5 MG PO TABS: ORAL_TABLET | ORAL | Status: DC

## 2014-12-10 NOTE — Telephone Encounter (Signed)
Sandra Ferrell notified alprazolam has been call in to her pharmacy.

## 2014-12-10 NOTE — Telephone Encounter (Signed)
Called to [facility].

## 2014-12-22 ENCOUNTER — Other Ambulatory Visit: Payer: Self-pay | Admitting: *Deleted

## 2014-12-22 MED ORDER — PIOGLITAZONE HCL 45 MG PO TABS: ORAL_TABLET | ORAL | Status: DC

## 2014-12-24 DIAGNOSIS — M4802 Spinal stenosis, cervical region: Secondary | ICD-10-CM | POA: Diagnosis not present

## 2014-12-28 ENCOUNTER — Other Ambulatory Visit: Payer: Self-pay | Admitting: *Deleted

## 2014-12-28 MED ORDER — CYCLOBENZAPRINE HCL 10 MG PO TABS: ORAL_TABLET | ORAL | Status: DC

## 2014-12-31 DIAGNOSIS — F17210 Nicotine dependence, cigarettes, uncomplicated: Secondary | ICD-10-CM | POA: Diagnosis not present

## 2014-12-31 DIAGNOSIS — Z716 Tobacco abuse counseling: Secondary | ICD-10-CM | POA: Diagnosis not present

## 2014-12-31 DIAGNOSIS — G959 Disease of spinal cord, unspecified: Secondary | ICD-10-CM | POA: Diagnosis not present

## 2015-01-03 ENCOUNTER — Other Ambulatory Visit: Payer: Self-pay | Admitting: *Deleted

## 2015-01-03 NOTE — Telephone Encounter (Signed)
Last office visit 11/09/2014.  Last refilled 12/10/2014 for #90 with no refills.  Ok to refill?

## 2015-01-04 ENCOUNTER — Other Ambulatory Visit: Payer: Self-pay | Admitting: Orthopedic Surgery

## 2015-01-04 MED ORDER — ALPRAZOLAM 0.5 MG PO TABS: ORAL_TABLET | ORAL | Status: DC

## 2015-01-04 NOTE — Telephone Encounter (Signed)
Called to [facility].

## 2015-01-06 ENCOUNTER — Other Ambulatory Visit: Payer: Self-pay | Admitting: *Deleted

## 2015-01-06 MED ORDER — IBUPROFEN 800 MG PO TABS: ORAL_TABLET | ORAL | Status: DC

## 2015-01-06 NOTE — Telephone Encounter (Signed)
Last office visit 11/09/2014.  Last refilled 11/15/2014 for #30 with 1 refill.  Ok to refill?

## 2015-01-07 ENCOUNTER — Telehealth: Payer: Self-pay

## 2015-01-07 NOTE — Telephone Encounter (Signed)
Pt left v/m; pt wants to know if Dr Lynann Bologna has notified Dr [name] that pt is scheduled for cervical spine surgery at [facility] on 01/19/15. Pt is going for prescreening on 01/12/15 at 9 AM. Pt request cb.

## 2015-01-07 NOTE — Telephone Encounter (Signed)
Noted  

## 2015-01-07 NOTE — Telephone Encounter (Signed)
Sandra Ferrell notified that Dr. [name] is now aware of her up coming surgery since she was nice enough to call us and let us know.  Advised if Dr. Lynann Bologna needed a pre-operative clearance form completed, to just have him fax it here to Dr. [name].Marland Kitchen

## 2015-01-12 ENCOUNTER — Encounter (HOSPITAL_COMMUNITY)
Admission: RE | Admit: 2015-01-12 | Discharge: 2015-01-12 | Disposition: A | Discharge status: Home / Self Care | Payer: Medicare Other | Source: Ambulatory Visit | Attending: Orthopedic Surgery | Admitting: Orthopedic Surgery

## 2015-01-12 ENCOUNTER — Encounter (HOSPITAL_COMMUNITY): Payer: Self-pay

## 2015-01-12 DIAGNOSIS — Z01818 Encounter for other preprocedural examination: Secondary | ICD-10-CM | POA: Diagnosis not present

## 2015-01-12 DIAGNOSIS — Z01812 Encounter for preprocedural laboratory examination: Secondary | ICD-10-CM | POA: Diagnosis not present

## 2015-01-12 DIAGNOSIS — Z01810 Encounter for preprocedural cardiovascular examination: Secondary | ICD-10-CM | POA: Diagnosis not present

## 2015-01-12 DIAGNOSIS — R05 Cough: Secondary | ICD-10-CM | POA: Diagnosis not present

## 2015-01-12 HISTORY — DX: Carpal tunnel syndrome, right upper limb: G56.01

## 2015-01-12 HISTORY — DX: Pneumonia, unspecified organism: J18.9

## 2015-01-12 HISTORY — DX: Presence of spectacles and contact lenses: Z97.3

## 2015-01-12 LAB — CBC WITH DIFFERENTIAL/PLATELET
Basophils Absolute: 0 10*3/uL (ref 0.0–0.1)
Basophils Relative: 0 % (ref 0–1)
Eosinophils Absolute: 0.2 10*3/uL (ref 0.0–0.7)
Eosinophils Relative: 2 % (ref 0–5)
HCT: 41.5 % (ref 36.0–46.0)
HEMOGLOBIN: 13.7 g/dL (ref 12.0–15.0)
LYMPHS ABS: 3.3 10*3/uL (ref 0.7–4.0)
Lymphocytes Relative: 35 % (ref 12–46)
MCH: 30.6 pg (ref 26.0–34.0)
MCHC: 33 g/dL (ref 30.0–36.0)
MCV: 92.8 fL (ref 78.0–100.0)
Monocytes Absolute: 0.7 10*3/uL (ref 0.1–1.0)
Monocytes Relative: 8 % (ref 3–12)
Neutro Abs: 5.2 10*3/uL (ref 1.7–7.7)
Neutrophils Relative %: 55 % (ref 43–77)
Platelets: 341 10*3/uL (ref 150–400)
RBC: 4.47 MIL/uL (ref 3.87–5.11)
RDW: 13.8 % (ref 11.5–15.5)
WBC: 9.4 10*3/uL (ref 4.0–10.5)

## 2015-01-12 LAB — COMPREHENSIVE METABOLIC PANEL
ALBUMIN: 3.2 g/dL — AB (ref 3.5–5.2)
ALT: 17 U/L (ref 0–35)
AST: 22 U/L (ref 0–37)
Alkaline Phosphatase: 137 U/L — ABNORMAL HIGH (ref 39–117)
Anion gap: 10 (ref 5–15)
BUN: 15 mg/dL (ref 6–23)
CHLORIDE: 101 mmol/L (ref 96–112)
CO2: 25 mmol/L (ref 19–32)
CREATININE: 0.69 mg/dL (ref 0.50–1.10)
Calcium: 9.5 mg/dL (ref 8.4–10.5)
GFR calc Af Amer: >90 mL/min (ref >90)
Glucose, Bld: 378 mg/dL — ABNORMAL HIGH (ref 70–99)
POTASSIUM: 4.1 mmol/L (ref 3.5–5.1)
Sodium: 136 mmol/L (ref 135–145)
TOTAL PROTEIN: 6.9 g/dL (ref 6.0–8.3)
Total Bilirubin: 0.4 mg/dL (ref 0.3–1.2)

## 2015-01-12 LAB — APTT: aPTT: 57 seconds — ABNORMAL HIGH (ref 24–37)

## 2015-01-12 LAB — PROTIME-INR
INR: 0.94 (ref 0.00–1.49)
PROTHROMBIN TIME: 12.7 s (ref 11.6–15.2)

## 2015-01-12 LAB — SURGICAL PCR SCREEN
MRSA, PCR: NEGATIVE
Staphylococcus aureus: POSITIVE — AB

## 2015-01-12 LAB — TYPE AND SCREEN
ABO/RH(D): A POS
ANTIBODY SCREEN: NEGATIVE

## 2015-01-12 LAB — URINALYSIS, ROUTINE W REFLEX MICROSCOPIC
BILIRUBIN URINE: NEGATIVE
Glucose, UA: >1000 mg/dL — AB
Hgb urine dipstick: NEGATIVE
KETONES UR: NEGATIVE mg/dL
Leukocytes, UA: NEGATIVE
NITRITE: NEGATIVE
Protein, ur: NEGATIVE mg/dL
Specific Gravity, Urine: 1.01 (ref 1.005–1.030)
UROBILINOGEN UA: 0.2 mg/dL (ref 0.0–1.0)
pH: 7 (ref 5.0–8.0)

## 2015-01-12 LAB — URINE MICROSCOPIC-ADD ON

## 2015-01-12 LAB — ABO/RH: ABO/RH(D): A POS

## 2015-01-12 NOTE — Progress Notes (Signed)
Pt denies SOB, chest pain, and being under the care of a cardiologist.Pt denies having a chest x ray and EKG within the last year. Pt denies having a cardiac cath. Pt chart forwarded to [place], [place] ( anesthesia) to review abormal chest x ray, EKG and labs ( glucose 378, PTT 57)

## 2015-01-12 NOTE — Pre-Procedure Instructions (Signed)
Sandra Ferrell  01/12/2015   Your procedure is scheduled on: Wednesday, January 19, 2015   Report to [facility] Admitting at 12:00 PM ( per MD)  Call this number if you have problems the morning of surgery: [phone]   Remember:   Do not eat food or drink liquids after midnight Tuesday, January 18, 2015   Take these medicines the morning of surgery with A SIP OF WATER: acetaminophen-codeine (TYLENOL #3), ALPRAZolam (XANAX), gabapentin (NEURONTIN), budesonide-formoterol (SYMBICORT) inhaler, if needed:chlorpheniramine (CHLOR-TRIMETON) for allergies,  albuterol (PROVENTIL HFA;VENTOLIN HFA) inhaler for wheezing  (bring inhaler with you on day of surgery).  DO NOT TAKE ANY DIABETIC MEDICATIONS ON THE MORNING OF PROCEDURE such as glipiZIDE (GLUCOTROL), Liraglutide, metFORMIN (GLUCOPHAGE) and  pioglitazone (ACTOS).  Stop taking Aspirin, vitamins, and herbal medications. Do not take any NSAIDs ie: Ibuprofen, Advil, Naproxen or any medication containing Aspirin such as meloxicam (MOBIC); Stop 5 days prior to procedure ( Friday, January 14, 2015)   Do not wear jewelry, make-up or nail polish.  Do not wear lotions, powders, or perfumes. You may not wear deodorant.  Do not shave 48 hours prior to surgery.  Do not bring valuables to the hospital.  [facility] is not responsible for any belongings or valuables.               Contacts, dentures or bridgework may not be worn into surgery.  Leave suitcase in the car. After surgery it may be brought to your room.  For patients admitted to the hospital, discharge time is determined by your treatment team.               Patients discharged the day of surgery will not be allowed to drive home.  Name and phone number of your driver:   Special Instructions:  Special Instructions:Special Instructions: [facility] - Preparing for Surgery  Before surgery, you can play an important role.  Because skin is not sterile, your skin needs to be as free of  germs as possible.  You can reduce the number of germs on you skin by washing with CHG (chlorahexidine gluconate) soap before surgery.  CHG is an antiseptic cleaner which kills germs and bonds with the skin to continue killing germs even after washing.  Please DO NOT use if you have an allergy to CHG or antibacterial soaps.  If your skin becomes reddened/irritated stop using the CHG and inform your nurse when you arrive at Short Stay.  Do not shave (including legs and underarms) for at least 48 hours prior to the first CHG shower.  You may shave your face.  Please follow these instructions carefully:   1.  Shower with CHG Soap the night before surgery and the morning of Surgery.  2.  If you choose to wash your hair, wash your hair first as usual with your normal shampoo.  3.  After you shampoo, rinse your hair and body thoroughly to remove the Shampoo.  4.  Use CHG as you would any other liquid soap.  You can apply chg directly  to the skin and wash gently with scrungie or a clean washcloth.  5.  Apply the CHG Soap to your body ONLY FROM THE NECK DOWN.  Do not use on open wounds or open sores.  Avoid contact with your eyes, ears, mouth and genitals (private parts).  Wash genitals (private parts) with your normal soap.  6.  Wash thoroughly, paying special attention to the area where your surgery  will be performed.  7.  Thoroughly rinse your body with warm water from the neck down.  8.  DO NOT shower/wash with your normal soap after using and rinsing off the CHG Soap.  9.  Pat yourself dry with a clean towel.            10.  Wear clean pajamas.            11.  Place clean sheets on your bed the night of your first shower and do not sleep with pets.  Day of Surgery  Do not apply any lotions/deodorants the morning of surgery.  Please wear clean clothes to the hospital/surgery center.   Please read over the following fact sheets that you were given: Pain Booklet, Coughing and Deep Breathing, Blood  Transfusion Information, MRSA Information and Surgical Site Infection Prevention

## 2015-01-13 NOTE — Progress Notes (Addendum)
Anesthesia Chart Review:  Patient is a 54 year old female scheduled for C5-6, C6-7 ACDF on 01/19/15 by Dr. Lynann Bologna.  History includes smoking, asthma, DM2, GERD, murmur (trivial MR/TR '12), fibromyalgia, HTN, OSA without CPAP use, HLD, depression, anxiety, cholecystectomy, appendectomy. BMI is consistent with morbid obesity. PCP is Dr. Eliezer Lofts. She is not routinely followed by cardiology, but did see Dr. Johnsie Cancel in 2012 for atypical chest pain and had a normal stress and echo.  Meds include albuterol, Xanax, Elavil, ASA, Symbicort, Flexeril, Chlor-trimeton, gabapentin, Lopid, glipizide, Liraglutide, metformin, Actos.  VITALS: HR 104, BP 141/78, RR 20, O2 sat 95%.  01/12/15 EKG: NSR, possible LAE, LAD, low voltage QRS, septal infarct (age undetermined).  No significant change since last tracing.  03/01/11 Echo:  - Left ventricle: The cavity size was normal. Wall thickness was increased in a pattern of mild LVH. Systolic function was normal. The estimated ejection fraction was in the range of 60% to 65%. Wall motion was normal; there were no regional wall motion abnormalities. Doppler parameters are consistent with abnormal left ventricular relaxation (grade 1 diastolic dysfunction). - Left atrium: The atrium was mildly dilated. - Atrial septum: No defect or patent foramen ovale was identified. - Trivial mitral and tricuspid regurgitation.  03/01/11 Nuclear stress test: Overall Impression: Normal stress nuclear study. No evidence of ischemia. Normal LV function. EF 80%.  01/12/15 CXR: 1. Cardiomegaly. 2. Mild pulmonary interstitial prominence noted bilaterally. Mild congestive heart failure with interstitial edema versus mild pneumonitis should be considered. Patient reported that she is getting over a URI. Had "slight" fever ~ 10 days ago. Still with a little chest congestion and cough, but getting better every day.  Typically uses albuterol daily, but has been using 2-3 X/day.  No  new SOB. No more fever.  Continues to smoke. No CP, syncope. LE edema.  She is able to do light housework including vacuuming without significant DOE.  Preoperative labs noted. Non-fasting glucose 378. PTT 57. A1C on 11/09/14 was elevated at 10.4 (up from 7.8 on 06/16/14). She is compliant with her DM medication, but has not been compliant with checking her CBGs.  She couldn't give me a recent glucose results. She denied recent steroid use.  She admits to not being able to exercise and hasn't been as strict with her diet.  She denied personal or family history of bleeding disorder.  Denies prior anesthesia complications.  Patient with evidence of uncontrolled DM and lingering symptoms of URI/bronchitis.  PTT is also elevated.  I discussed with anesthesiologist Dr. Jenita Seashore who agrees that it would be best for patient to be re-evaluated at her PCP office prior to surgery to address hyperglycemia and ensure URI symptoms have resolved.  Will also plan to repeat a repeat PTT for the day of surgery.  I have called above recommendations and elevated PTT results to [facility] at Dr. Wayne Sever office.  I had already asked patient to start checking her glucose readings (she will try to do TID) and call me back with her fasting glucose results.  George Hugh [facility] Short Stay Center/Anesthesiology Phone [phone] 01/13/2015 12:29 PM   Addendum: Patient was seen for preoperative evaluation on 01/14/15.  She was not cleared at that time due to hyperglycemia.  She was started on Lantus with plans to follow-up with Dr. [name] today.  Her note is not yet completed, but Angela Nevin reports she received a message from Dr. [name] stating that she was clearing patient for surgery with plans for  her to follow-up blood sugar reading in one week after surgery.  Patient instructions from today's appointment support this.  She is for CBG and repeat PTT on arrival tomorrow.  If both are felt acceptable then I would anticipate  that she could proceed as planned.  George Hugh [facility] Short Stay Center/Anesthesiology Phone [phone] 01/18/2015 5:55 PM

## 2015-01-14 ENCOUNTER — Ambulatory Visit (INDEPENDENT_AMBULATORY_CARE_PROVIDER_SITE_OTHER): Payer: Medicare Other | Admitting: Primary Care

## 2015-01-14 ENCOUNTER — Telehealth: Payer: Self-pay | Admitting: Primary Care

## 2015-01-14 ENCOUNTER — Encounter: Payer: Self-pay | Admitting: Primary Care

## 2015-01-14 VITALS — BP 130/72 | HR 102 | Temp 98.0°F | Ht 59.0 in | Wt 228.0 lb

## 2015-01-14 DIAGNOSIS — E119 Type 2 diabetes mellitus without complications: Secondary | ICD-10-CM

## 2015-01-14 DIAGNOSIS — Z01818 Encounter for other preprocedural examination: Secondary | ICD-10-CM

## 2015-01-14 MED ORDER — INSULIN GLARGINE 100 UNIT/ML SOLOSTAR PEN: 10.0000 [IU] | PEN_INJECTOR | Freq: Every day | SUBCUTANEOUS | Status: DC

## 2015-01-14 NOTE — Assessment & Plan Note (Signed)
Not at goal. CMP at pre-op labs showed a glucose level of 378. Stop Glipizide. Add Lantus 10 units daily at bedtime. Follow up on 3/15 for repeat BMP. Strongly encouraged patient to stick to her low sugar/carb diet.

## 2015-01-14 NOTE — Progress Notes (Signed)
Pre visit review using our clinic review tool, if applicable. No additional management support is needed unless otherwise documented below in the visit note. 

## 2015-01-14 NOTE — Progress Notes (Signed)
Subjective:    Patient ID: Sandra Ferrell, female    DOB: April 07, 1961, 54 y.o.   MRN: [id number]  HPI  Sandra Ferrell is a 54 year old female who presents today for surgical clearance for her cervical myelopathy. She had pre-operative blood work completed on 01/12/15 and it was noted that her glucose was 378. She reports recent cough/cold symptoms over the past 2 weeks, has been using regular OTC robitussin, and has switched to sugar free Robitussin 2 days ago. She reports compliance to her medications and diet (with a few pieces of chocolate).  Her A1C in Jaunary 2016 was 10.4 and since then reports she's been working hard on her diet. She cannot exercise due to her neck pain. Her sugars for the past 2 days are mentioned below.  01/13/15 313 at 2:30pm 366 at 5:20pm 354 at 7:45pm 120 at 10:40pm  01/14/15  245 at 9am 250 at lunch    Review of Systems  Constitutional: Negative for fever.  HENT: Negative for rhinorrhea and sinus pressure.   Respiratory: Positive for cough. Negative for shortness of breath.   Cardiovascular: Negative for chest pain and palpitations.  Gastrointestinal: Negative for nausea, vomiting, diarrhea, constipation and blood in stool.  Endocrine: Negative for polydipsia, polyphagia and polyuria.  Genitourinary: Negative for dysuria and urgency.  Musculoskeletal: Positive for back pain, arthralgias and neck pain.  Skin: Negative for rash.  Neurological: Positive for numbness. Negative for dizziness and headaches.       Numbness is chronic per patient.       Past Medical History  Diagnosis Date  . Allergic rhinitis, cause unspecified   . Anxiety state, unspecified   . Extrinsic asthma with exacerbation   . Backache, unspecified   . Depressive disorder, not elsewhere classified   . Type II or unspecified type diabetes mellitus without mention of complication, not stated as uncontrolled   . Esophageal reflux   . Other and unspecified hyperlipidemia   .  Osteoarthrosis, unspecified whether generalized or localized, unspecified site   . Other screening mammogram   . Routine gynecological examination   . Unspecified sleep apnea   . Tobacco use disorder   . Personal history of unspecified urinary disorder   . Routine general medical examination at a health care facility   . Heart murmur   . Hypertension   . Calculus of kidney   . Fibromyalgia   . Wears glasses   . Carpal tunnel syndrome, right   . Pneumonia     History   Social History  . Marital Status: Married    Spouse Name: N/A  . Number of Children: N/A  . Years of Education: N/A   Occupational History  . Housewife    Social History Main Topics  . Smoking status: Current Every Day Smoker -- 1.00 packs/day for 35 years    Types: Cigarettes  . Smokeless tobacco: Never Used  . Alcohol Use: No  . Drug Use: No  . Sexual Activity: Not on file   Other Topics Concern  . Not on file   Social History Narrative   Moderate diet.    No living will, no HCPOA.    Past Surgical History  Procedure Laterality Date  . Appendectomy  1973  . Cholecystectomy  08/2006  . Transthoracic echocardiogram  02/2011    mild LVH, nl EF, mild diastolic dysfunction, no wall motion abnl  . Colonoscopy      Family History  Problem Relation Age of Onset  .  Stroke Father   . Colon cancer Cousin     Allergies  Allergen Reactions  . Benazepril Swelling    Angioedema  . Aspirin     REACTION: Burns stomach  . Diclofenac Sodium     REACTION: GI bleed  . Fluticasone-Salmeterol     REACTION: tongue was swollen and hives and itching  . Nsaids     REACTION: rectal bleeding  . Penicillins     REACTION: rash    Current Outpatient Prescriptions on File Prior to Visit  Medication Sig Dispense Refill  . ACCU-CHEK AVIVA PLUS test strip 1 each by Other route as needed.     Marland Kitchen acetaminophen-codeine (TYLENOL #3) 300-30 MG per tablet Take 1 tablet by mouth 2 (two) times daily.  0  . albuterol  (PROVENTIL HFA;VENTOLIN HFA) 108 (90 BASE) MCG/ACT inhaler INHALE 2 PUFFS INTO THE LUNGS EVERY 4 (FOUR) HOURS AS NEEDED FOR WHEEZING. 8.5 each 4  . ALPRAZolam (XANAX) 0.5 MG tablet TAKE 1 TABLET 3 TIMES A DAY (Patient taking differently: Take 0.5 mg by mouth. TAKE 1 TABLET 3 TIMES A DAY) 90 tablet 0  . amitriptyline (ELAVIL) 50 MG tablet Take 50 mg by mouth at bedtime.    Marland Kitchen aspirin EC 81 MG EC tablet Take 1 tablet (81 mg total) by mouth daily.    . Blood Glucose Monitoring Suppl (ACCU-CHEK AVIVA PLUS) W/DEVICE KIT     . budesonide-formoterol (SYMBICORT) 80-4.5 MCG/ACT inhaler INHALE 2 PUFFS INTO THE LUNGS 2 (TWO) TIMES DAILY. (Patient taking differently: Inhale 2 puffs into the lungs 2 (two) times daily. INHALE 2 PUFFS INTO THE LUNGS 2 (TWO) TIMES DAILY.) 10.2 g 5  . calcium elemental as carbonate (ANTACID ULTRA STRENGTH) 400 MG tablet Chew 1,000 mg by mouth as needed. heartburn    . chlorpheniramine (CHLOR-TRIMETON) 4 MG tablet Take 4 mg by mouth 2 (two) times daily as needed for allergies.    . cyclobenzaprine (FLEXERIL) 10 MG tablet TAKE 1 TABLET (10 MG TOTAL) BY MOUTH 2 (TWO) TIMES DAILY. 60 tablet 1  . gabapentin (NEURONTIN) 300 MG capsule Take 300 mg by mouth 3 (three) times daily.     Marland Kitchen gemfibrozil (LOPID) 600 MG tablet TAKE 1 TABLET BY MOUTH TWICE A DAY (Patient taking differently: Take 600 mg by mouth 2 (two) times daily before a meal. TAKE 1 TABLET BY MOUTH TWICE A DAY) 60 tablet 5  . glipiZIDE (GLUCOTROL) 10 MG tablet TAKE 1 TABLET (10 MG TOTAL) BY MOUTH 2 (TWO) TIMES DAILY BEFORE A MEAL. (Patient taking differently: Take 10 mg by mouth 2 (two) times daily before a meal. TAKE 1 TABLET (10 MG TOTAL) BY MOUTH 2 (TWO) TIMES DAILY BEFORE A MEAL.) 60 tablet 5  . guaifenesin (ROBITUSSIN) 100 MG/5ML syrup Take 200 mg by mouth 3 (three) times daily as needed for cough.    Marland Kitchen ibuprofen (ADVIL,MOTRIN) 800 MG tablet TAKE 1 TABLET (800 MG TOTAL) BY MOUTH EVERY 8 (EIGHT) HOURS AS NEEDED FOR PAIN. (Patient  taking differently: Take 800 mg by mouth every 8 (eight) hours as needed for mild pain or moderate pain. TAKE 1 TABLET (800 MG TOTAL) BY MOUTH EVERY 8 (EIGHT) HOURS AS NEEDED FOR PAIN.) 60 tablet 1  . Liraglutide 18 MG/3ML SOPN Inject 1.8 mg into the skin daily. 15 mL 5  . meloxicam (MOBIC) 15 MG tablet Take 15 mg by mouth daily.    . metFORMIN (GLUCOPHAGE) 1000 MG tablet Take 1 tablet (1,000 mg total) by mouth 2 (two) times  daily with a meal. 60 tablet 5  . pioglitazone (ACTOS) 45 MG tablet TAKE 1 TABLET (45 MG TOTAL) BY MOUTH DAILY. (Patient taking differently: Take 45 mg by mouth daily. TAKE 1 TABLET (45 MG TOTAL) BY MOUTH DAILY.) 30 tablet 5   No current facility-administered medications on file prior to visit.    BP 130/72 mmHg  Pulse 102  Temp(Src) 98 F (36.7 C) (Oral)  Ht _0  (1.499 m)  Wt 228 lb (103.42 kg)  BMI 46.03 kg/m2  SpO2 96%    Objective:   Physical Exam  Constitutional: She is oriented to person, place, and time. She appears well-developed.  HENT:  Head: Normocephalic.  Right Ear: External ear normal.  Left Ear: External ear normal.  Nose: Nose normal.  Mouth/Throat: Oropharynx is clear and moist.  Eyes: Conjunctivae are normal. Pupils are equal, round, and reactive to light.  Neck: Neck supple.  Cardiovascular: Normal rate and regular rhythm.   Pulmonary/Chest: Effort normal. She has no rales.  Abdominal: Soft. Bowel sounds are normal. There is no tenderness.  Musculoskeletal:  Stiffness and pain to neck and lumbar spine.   Lymphadenopathy:    She has no cervical adenopathy.  Neurological: She is alert and oriented to person, place, and time. She has normal reflexes. No cranial nerve deficit.  Skin: Skin is warm and dry. No rash noted.  Psychiatric: She has a normal mood and affect.          Assessment & Plan:

## 2015-01-14 NOTE — Telephone Encounter (Signed)
Surgical Clearance Form in your INbox for review. Pt has appt this afternoon at 2:30pm.

## 2015-01-14 NOTE — Assessment & Plan Note (Addendum)
Unable to clear patient for surgery today due to elevated blood glucose levels. Will call [facility] Monday 3/14 to notify of plan. Start Lantus daily 10 units at bedtime. Stop Glipizide. Follow up with Dr. [name] on 3/15 for follow up BMET.  Lab Results  Component Value Date   HGBA1C 10.4* 11/09/2014    Glucose during pre-op labs was 348. Glucose levels at home: 01/13/15 313 at 2:30pm 366 at 5:20pm 354 at 7:45pm 120 at 10:40pm  01/14/15  245 at 9am 250 at lunch

## 2015-01-14 NOTE — Patient Instructions (Signed)
We cannot clear you for surgery today due to your elevated blood sugar levels. Stop Glipizide, continue Victoza and Actos Start Lantus Insulin. 10 units every evening at bedtime. Continue to watch your diet and eliminate sugary foods. Schedule a follow up appointment with Dr. [name] next Tuesday for a re-check of your sugar.  Diabetes Mellitus and Food It is important for you to manage your blood sugar (glucose) level. Your blood glucose level can be greatly affected by what you eat. Eating healthier foods in the appropriate amounts throughout the day at about the same time each day will help you control your blood glucose level. It can also help slow or prevent worsening of your diabetes mellitus. Healthy eating may even help you improve the level of your blood pressure and reach or maintain a healthy weight.  HOW CAN FOOD AFFECT ME? Carbohydrates Carbohydrates affect your blood glucose level more than any other type of food. Your dietitian will help you determine how many carbohydrates to eat at each meal and teach you how to count carbohydrates. Counting carbohydrates is important to keep your blood glucose at a healthy level, especially if you are using insulin or taking certain medicines for diabetes mellitus. Alcohol Alcohol can cause sudden decreases in blood glucose (hypoglycemia), especially if you use insulin or take certain medicines for diabetes mellitus. Hypoglycemia can be a life-threatening condition. Symptoms of hypoglycemia (sleepiness, dizziness, and disorientation) are similar to symptoms of having too much alcohol.  If your health care provider has given you approval to drink alcohol, do so in moderation and use the following guidelines:  Women should not have more than one drink per day, and men should not have more than two drinks per day. One drink is equal to:  12 oz of beer.  5 oz of wine.  1 oz of hard liquor.  Do not drink on an empty stomach.  Keep yourself  hydrated. Have water, diet soda, or unsweetened iced tea.  Regular soda, juice, and other mixers might contain a lot of carbohydrates and should be counted. WHAT FOODS ARE NOT RECOMMENDED? As you make food choices, it is important to remember that all foods are not the same. Some foods have fewer nutrients per serving than other foods, even though they might have the same number of calories or carbohydrates. It is difficult to get your body what it needs when you eat foods with fewer nutrients. Examples of foods that you should avoid that are high in calories and carbohydrates but low in nutrients include:  Trans fats (most processed foods list trans fats on the Nutrition Facts label).  Regular soda.  Juice.  Candy.  Sweets, such as cake, pie, doughnuts, and cookies.  Fried foods. WHAT FOODS CAN I EAT? Have nutrient-rich foods, which will nourish your body and keep you healthy. The food you should eat also will depend on several factors, including:  The calories you need.  The medicines you take.  Your weight.  Your blood glucose level.  Your blood pressure level.  Your cholesterol level. You also should eat a variety of foods, including:  Protein, such as meat, poultry, fish, tofu, nuts, and seeds (lean animal proteins are best).  Fruits.  Vegetables.  Dairy products, such as milk, cheese, and yogurt (low fat is best).  Breads, grains, pasta, cereal, rice, and beans.  Fats such as olive oil, trans fat-free margarine, canola oil, avocado, and olives. DOES EVERYONE WITH DIABETES MELLITUS HAVE THE SAME MEAL PLAN? Because every person  with diabetes mellitus is different, there is not one meal plan that works for everyone. It is very important that you meet with a dietitian who will help you create a meal plan that is just right for you. Document Released: 07/19/2005 Document Revised: 10/27/2013 Document Reviewed: 09/18/2013 [facility] Patient Information 2015 [place],  [place]. This information is not intended to replace advice given to you by your health care provider. Make sure you discuss any questions you have with your health care provider.

## 2015-01-17 ENCOUNTER — Telehealth: Payer: Self-pay

## 2015-01-17 NOTE — Telephone Encounter (Signed)
Pt left v/m requesting refill on cyclobenzaprine; spoke with pt husband and advised pt should ck with pharmacy, there should be an available refill. Sandra Ferrell will let pt know.

## 2015-01-18 ENCOUNTER — Encounter: Payer: Self-pay | Admitting: Family Medicine

## 2015-01-18 ENCOUNTER — Ambulatory Visit (INDEPENDENT_AMBULATORY_CARE_PROVIDER_SITE_OTHER): Payer: Medicare Other | Admitting: Family Medicine

## 2015-01-18 VITALS — BP 120/70 | HR 102 | Temp 98.0°F | Ht 59.0 in | Wt 226.0 lb

## 2015-01-18 DIAGNOSIS — Z01818 Encounter for other preprocedural examination: Secondary | ICD-10-CM

## 2015-01-18 DIAGNOSIS — E119 Type 2 diabetes mellitus without complications: Secondary | ICD-10-CM

## 2015-01-18 LAB — GLUCOSE, POCT (MANUAL RESULT ENTRY): POC GLUCOSE: 170 mg/dL — AB (ref 70–99)

## 2015-01-18 MED ORDER — VANCOMYCIN HCL 10 G IV SOLR
1500.0000 mg | INTRAVENOUS | Status: AC
  Administered 2015-01-19: 1500 mg via INTRAVENOUS
  Filled 2015-01-18: qty 1500

## 2015-01-18 NOTE — H&P (Signed)
PREOPERATIVE H&P  Chief Complaint: balance deterioration  HPI: Sandra Ferrell is a 54 y.o. 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 Allergic rhinitis, cause unspecified   . Anxiety state, unspecified   . Extrinsic asthma with exacerbation   . Backache, unspecified   . Depressive disorder, not elsewhere classified   . Type II or unspecified type diabetes mellitus without mention of complication, not stated as uncontrolled   . Esophageal reflux   . Other and unspecified hyperlipidemia   . Osteoarthrosis, unspecified whether generalized or localized, unspecified site   . Other screening mammogram   . Routine gynecological examination   . Unspecified sleep apnea   . Tobacco use disorder   . Personal history of unspecified urinary disorder   . Routine general medical examination at a health care facility   . Heart murmur   . Hypertension   . Calculus of kidney   . Fibromyalgia   . Wears glasses   . Carpal tunnel syndrome, right   . Pneumonia    Past Surgical History  Procedure Laterality Date  . Appendectomy  1973  . Cholecystectomy  08/2006  . Transthoracic echocardiogram  02/2011    mild LVH, nl EF, mild diastolic dysfunction, no wall motion abnl  . Colonoscopy     History   Social History  . Marital Status: Married    Spouse Name: N/A  . Number of Children: N/A  . Years of Education: N/A   Occupational History  . Housewife    Social History Main Topics  . Smoking status: Current Every Day Smoker -- 1.00 packs/day for 35 years    Types: Cigarettes  . Smokeless tobacco: Never Used  . Alcohol Use: No  . Drug Use: No  . Sexual Activity: Not on file   Other Topics  Concern  . Not on file   Social History Narrative   Moderate diet.    No living will, no HCPOA.   Family History  Problem Relation Age of Onset  . Stroke Father   . Colon cancer Cousin    Allergies  Allergen Reactions  . Benazepril Swelling    Angioedema  . Aspirin     REACTION: Burns stomach  . Diclofenac Sodium     REACTION: GI bleed  . Fluticasone-Salmeterol     REACTION: tongue was swollen and hives and itching  . Nsaids     REACTION: rectal bleeding  . Penicillins     REACTION: rash   Prior to Admission medications   Medication Sig Start Date End Date Taking? Authorizing Provider  ACCU-CHEK AVIVA PLUS test strip 1 each by Other route as needed.  09/20/14  Yes Historical Provider, MD  acetaminophen-codeine (TYLENOL #3) 300-30 MG per tablet Take 1 tablet by mouth 2 (two) times daily. 12/22/14  Yes Historical Provider, MD  albuterol (PROVENTIL HFA;VENTOLIN HFA) 108 (90 BASE) MCG/ACT inhaler INHALE 2 PUFFS INTO THE LUNGS EVERY 4 (FOUR) HOURS AS NEEDED FOR WHEEZING. 03/10/13  Yes Amy E Bedsole, MD  ALPRAZolam (XANAX) 0.5 MG tablet TAKE 1 TABLET 3 TIMES A DAY Patient taking differently: Take 0.5 mg by mouth. TAKE 1 TABLET 3 TIMES A DAY 01/04/15  Yes Amy Cletis Athens, MD  amitriptyline (ELAVIL) 50 MG tablet Take 50 mg by mouth at bedtime. 11/04/13  Yes Amy Cletis Athens, MD  aspirin EC 81 MG EC tablet Take 1 tablet (81 mg total) by mouth daily. 02/01/11  Yes Ria Bush, MD  Blood Glucose Monitoring Suppl (ACCU-CHEK AVIVA PLUS) W/DEVICE KIT  08/13/14  Yes Historical Provider, MD  budesonide-formoterol (SYMBICORT) 80-4.5 MCG/ACT inhaler INHALE 2 PUFFS INTO THE LUNGS 2 (TWO) TIMES DAILY. Patient taking differently: Inhale 2 puffs into the lungs 2 (two) times daily. INHALE 2 PUFFS INTO THE LUNGS 2 (TWO) TIMES DAILY. 09/13/14  Yes Amy Cletis Athens, MD  calcium elemental as carbonate (ANTACID ULTRA STRENGTH) 400 MG tablet Chew 1,000 mg by mouth as needed. heartburn   Yes Historical Provider, MD    chlorpheniramine (CHLOR-TRIMETON) 4 MG tablet Take 4 mg by mouth 2 (two) times daily as needed for allergies.   Yes Historical Provider, MD  cyclobenzaprine (FLEXERIL) 10 MG tablet TAKE 1 TABLET (10 MG TOTAL) BY MOUTH 2 (TWO) TIMES DAILY. 12/28/14  Yes Amy Cletis Athens, MD  gabapentin (NEURONTIN) 300 MG capsule Take 300 mg by mouth 3 (three) times daily.    Yes Historical Provider, MD  gemfibrozil (LOPID) 600 MG tablet TAKE 1 TABLET BY MOUTH TWICE A DAY Patient taking differently: Take 600 mg by mouth 2 (two) times daily before a meal. TAKE 1 TABLET BY MOUTH TWICE A DAY 12/01/14  Yes Amy E Bedsole, MD  guaifenesin (ROBITUSSIN) 100 MG/5ML syrup Take 200 mg by mouth 3 (three) times daily as needed for cough.   Yes Historical Provider, MD  ibuprofen (ADVIL,MOTRIN) 800 MG tablet TAKE 1 TABLET (800 MG TOTAL) BY MOUTH EVERY 8 (EIGHT) HOURS AS NEEDED FOR PAIN. Patient taking differently: Take 800 mg by mouth every 8 (eight) hours as needed for mild pain or moderate pain. TAKE 1 TABLET (800 MG TOTAL) BY MOUTH EVERY 8 (EIGHT) HOURS AS NEEDED FOR PAIN. 01/06/15  Yes Amy Cletis Athens, MD  Liraglutide 18 MG/3ML SOPN Inject 1.8 mg into the skin daily. 08/25/14  Yes Amy Cletis Athens, MD  meloxicam (MOBIC) 15 MG tablet Take 15 mg by mouth daily.   Yes Historical Provider, MD  metFORMIN (GLUCOPHAGE) 1000 MG tablet Take 1 tablet (1,000 mg total) by mouth 2 (two) times daily with a meal. 10/04/14  Yes Amy E Bedsole, MD  pioglitazone (ACTOS) 45 MG tablet TAKE 1 TABLET (45 MG TOTAL) BY MOUTH DAILY. Patient taking differently: Take 45 mg by mouth daily. TAKE 1 TABLET (45 MG TOTAL) BY MOUTH DAILY. 12/22/14  Yes Amy Cletis Athens, MD  Insulin Glargine (LANTUS SOLOSTAR) 100 UNIT/ML Solostar Pen Inject 10 Units into the skin at bedtime. 01/14/15   Alma Friendly, NP  mupirocin ointment Drue Stager) 2 %  01/14/15   Historical Provider, MD     All other systems have been reviewed and were otherwise negative with the exception of those  mentioned in the HPI and as above.  Physical Exam: There were no vitals filed for this visit.  General: Alert, no acute distress Cardiovascular: No pedal edema Respiratory: No cyanosis, no use of accessory musculature Skin: No lesions in the area of chief complaint Neurologic: Sensation intact distally Psychiatric: Patient is competent for consent with normal mood and affect Lymphatic: No axillary or cervical lymphadenopathy  MUSCULOSKELETAL: + hoffman's on right > left  Assessment/Plan: Myelopathy Plan for Procedure(s): ANTERIOR CERVICAL DECOMPRESSION/DISCECTOMY FUSION 2 LEVELS   Sinclair Ship, MD 01/18/2015 4:01 PM

## 2015-01-18 NOTE — Progress Notes (Signed)
Pre visit review using our clinic review tool, if applicable. No additional management support is needed unless otherwise documented below in the visit note. 

## 2015-01-18 NOTE — Assessment & Plan Note (Signed)
Now that DM improved control, she is cleared for moderaete invasive surgery.

## 2015-01-18 NOTE — Patient Instructions (Addendum)
Continue actos metfromin and victoza for now.  Take insulin  7 units tonight.  Call with blood sugars 1 week after surgery.

## 2015-01-18 NOTE — Assessment & Plan Note (Signed)
Improved with addition of insulin. Stay off glipizide, continue to watch iet and exercsie as able.,  ONce surgery done.. 1 week post op, call with blood sugars  For consideration oif increase in insulin and decrease of actos vs. victoza.

## 2015-01-18 NOTE — Progress Notes (Signed)
   Subjective:    Patient ID: Sandra Ferrell, female    DOB: 1961-02-11, 54 y.o.   MRN: [id number]  HPI   3 yea rold female presents for follow up for preop clearance.  At last OV with Allie Bossier, she was not cleared given CBGs very high. 378 Stop Glipizide. Add Lantus 10 units daily at bedtime On actos, metformin, victoza.  She has been nervous about insulin .Marland Kitchen Took 6 units yesterday.  On tylenol 3 for pain. Off NSAIDs.   Diabetes:  Lab Results  Component Value Date   HGBA1C 10.4* 11/09/2014  Glucose levels at home: 01/13/15 313 at 2:30pm 366 at 5:20pm 354 at 7:45pm 120 at 10:40pm  01/14/15  245 at 9am 250 at lunch  In office today FBS 170     Review of Systems  Constitutional: Negative for fever and fatigue.  HENT: Negative for ear pain.   Eyes: Negative for pain.  Respiratory: Negative for chest tightness and shortness of breath.   Cardiovascular: Negative for chest pain, palpitations and leg swelling.  Gastrointestinal: Negative for abdominal pain.  Genitourinary: Negative for dysuria.       Objective:   Physical Exam  Constitutional: Vital signs are normal. She appears well-developed and well-nourished. She is cooperative.  Non-toxic appearance. She does not appear ill. No distress.  HENT:  Head: Normocephalic.  Right Ear: Hearing, tympanic membrane, external ear and ear canal normal. Tympanic membrane is not erythematous, not retracted and not bulging.  Left Ear: Hearing, tympanic membrane, external ear and ear canal normal. Tympanic membrane is not erythematous, not retracted and not bulging.  Nose: No mucosal edema or rhinorrhea. Right sinus exhibits no maxillary sinus tenderness and no frontal sinus tenderness. Left sinus exhibits no maxillary sinus tenderness and no frontal sinus tenderness.  Mouth/Throat: Uvula is midline, oropharynx is clear and moist and mucous membranes are normal.  Eyes: Conjunctivae, EOM and lids are normal. Pupils are equal,  round, and reactive to light. Lids are everted and swept, no foreign bodies found.  Neck: Trachea normal and normal range of motion. Neck supple. Carotid bruit is not present. No thyroid mass and no thyromegaly present.  Cardiovascular: Normal rate, regular rhythm, S1 normal, S2 normal, normal heart sounds, intact distal pulses and normal pulses.  Exam reveals no gallop and no friction rub.   No murmur heard. Pulmonary/Chest: Effort normal and breath sounds normal. No tachypnea. No respiratory distress. She has no decreased breath sounds. She has no wheezes. She has no rhonchi. She has no rales.  Abdominal: Soft. Normal appearance and bowel sounds are normal. There is no tenderness.  Neurological: She is alert.  Skin: Skin is warm, dry and intact. No rash noted.  Psychiatric: Her speech is normal and behavior is normal. Judgment and thought content normal. Her mood appears not anxious. Cognition and memory are normal. She does not exhibit a depressed mood.          Assessment & Plan:

## 2015-01-19 ENCOUNTER — Encounter (HOSPITAL_COMMUNITY): Payer: Self-pay | Admitting: *Deleted

## 2015-01-19 ENCOUNTER — Ambulatory Visit (HOSPITAL_COMMUNITY): Payer: Medicare Other | Admitting: Vascular Surgery

## 2015-01-19 ENCOUNTER — Ambulatory Visit (HOSPITAL_COMMUNITY): Payer: Medicare Other | Admitting: Anesthesiology

## 2015-01-19 ENCOUNTER — Inpatient Hospital Stay (HOSPITAL_COMMUNITY)
Admission: RE | Admit: 2015-01-19 | Discharge: 2015-01-20 | DRG: 472 | Disposition: A | Discharge status: Home / Self Care | Payer: Medicare Other | Source: Ambulatory Visit | Attending: Orthopedic Surgery | Admitting: Orthopedic Surgery

## 2015-01-19 ENCOUNTER — Encounter (HOSPITAL_COMMUNITY)
Admission: RE | Disposition: A | Discharge status: Home / Self Care | Payer: Self-pay | Source: Ambulatory Visit | Attending: Orthopedic Surgery

## 2015-01-19 ENCOUNTER — Ambulatory Visit (HOSPITAL_COMMUNITY): Payer: Medicare Other

## 2015-01-19 ENCOUNTER — Other Ambulatory Visit: Payer: Self-pay

## 2015-01-19 DIAGNOSIS — G9529 Other cord compression: Secondary | ICD-10-CM | POA: Diagnosis not present

## 2015-01-19 DIAGNOSIS — M5012 Cervical disc disorder with radiculopathy, mid-cervical region: Secondary | ICD-10-CM | POA: Diagnosis present

## 2015-01-19 DIAGNOSIS — E784 Other hyperlipidemia: Secondary | ICD-10-CM | POA: Diagnosis present

## 2015-01-19 DIAGNOSIS — M4320 Fusion of spine, site unspecified: Secondary | ICD-10-CM

## 2015-01-19 DIAGNOSIS — M549 Dorsalgia, unspecified: Secondary | ICD-10-CM | POA: Diagnosis not present

## 2015-01-19 DIAGNOSIS — E119 Type 2 diabetes mellitus without complications: Secondary | ICD-10-CM | POA: Diagnosis present

## 2015-01-19 DIAGNOSIS — K219 Gastro-esophageal reflux disease without esophagitis: Secondary | ICD-10-CM | POA: Diagnosis not present

## 2015-01-19 DIAGNOSIS — M5002 Cervical disc disorder with myelopathy, mid-cervical region: Secondary | ICD-10-CM | POA: Diagnosis not present

## 2015-01-19 DIAGNOSIS — F17210 Nicotine dependence, cigarettes, uncomplicated: Secondary | ICD-10-CM | POA: Diagnosis present

## 2015-01-19 DIAGNOSIS — M542 Cervicalgia: Secondary | ICD-10-CM | POA: Diagnosis not present

## 2015-01-19 DIAGNOSIS — F419 Anxiety disorder, unspecified: Secondary | ICD-10-CM | POA: Diagnosis present

## 2015-01-19 DIAGNOSIS — M5000 Cervical disc disorder with myelopathy, unspecified cervical region: Secondary | ICD-10-CM | POA: Diagnosis not present

## 2015-01-19 DIAGNOSIS — Z794 Long term (current) use of insulin: Secondary | ICD-10-CM | POA: Diagnosis not present

## 2015-01-19 DIAGNOSIS — F329 Major depressive disorder, single episode, unspecified: Secondary | ICD-10-CM | POA: Diagnosis present

## 2015-01-19 DIAGNOSIS — M4802 Spinal stenosis, cervical region: Secondary | ICD-10-CM | POA: Diagnosis not present

## 2015-01-19 DIAGNOSIS — R269 Unspecified abnormalities of gait and mobility: Secondary | ICD-10-CM | POA: Diagnosis not present

## 2015-01-19 DIAGNOSIS — J45909 Unspecified asthma, uncomplicated: Secondary | ICD-10-CM | POA: Diagnosis not present

## 2015-01-19 DIAGNOSIS — M4322 Fusion of spine, cervical region: Secondary | ICD-10-CM | POA: Diagnosis not present

## 2015-01-19 DIAGNOSIS — G4730 Sleep apnea, unspecified: Secondary | ICD-10-CM | POA: Diagnosis present

## 2015-01-19 DIAGNOSIS — G5601 Carpal tunnel syndrome, right upper limb: Secondary | ICD-10-CM | POA: Diagnosis not present

## 2015-01-19 DIAGNOSIS — R2689 Other abnormalities of gait and mobility: Secondary | ICD-10-CM | POA: Diagnosis not present

## 2015-01-19 DIAGNOSIS — M5082 Other cervical disc disorders, mid-cervical region: Secondary | ICD-10-CM | POA: Diagnosis not present

## 2015-01-19 DIAGNOSIS — Z7951 Long term (current) use of inhaled steroids: Secondary | ICD-10-CM | POA: Diagnosis not present

## 2015-01-19 DIAGNOSIS — M797 Fibromyalgia: Secondary | ICD-10-CM | POA: Diagnosis not present

## 2015-01-19 DIAGNOSIS — Z79899 Other long term (current) drug therapy: Secondary | ICD-10-CM

## 2015-01-19 DIAGNOSIS — I10 Essential (primary) hypertension: Secondary | ICD-10-CM | POA: Diagnosis present

## 2015-01-19 DIAGNOSIS — G959 Disease of spinal cord, unspecified: Secondary | ICD-10-CM | POA: Diagnosis present

## 2015-01-19 HISTORY — PX: ANTERIOR CERVICAL DECOMP/DISCECTOMY FUSION: SHX1161

## 2015-01-19 LAB — GLUCOSE, CAPILLARY
GLUCOSE-CAPILLARY: 140 mg/dL — AB (ref 70–99)
Glucose-Capillary: 168 mg/dL — ABNORMAL HIGH (ref 70–99)
Glucose-Capillary: 131 mg/dL — ABNORMAL HIGH (ref 70–99)
Glucose-Capillary: 143 mg/dL — ABNORMAL HIGH (ref 70–99)

## 2015-01-19 LAB — APTT: aPTT: 57 seconds — ABNORMAL HIGH (ref 24–37)

## 2015-01-19 SURGERY — ANTERIOR CERVICAL DECOMPRESSION/DISCECTOMY FUSION 2 LEVELS
Anesthesia: General | Site: Neck

## 2015-01-19 MED ORDER — LABETALOL HCL 5 MG/ML IV SOLN
INTRAVENOUS | Status: AC
  Filled 2015-01-19: qty 4

## 2015-01-19 MED ORDER — FENTANYL CITRATE 0.05 MG/ML IJ SOLN
INTRAMUSCULAR | Status: AC
  Filled 2015-01-19: qty 5

## 2015-01-19 MED ORDER — BUPIVACAINE-EPINEPHRINE (PF) 0.25% -1:200000 IJ SOLN
INTRAMUSCULAR | Status: AC
  Filled 2015-01-19: qty 30

## 2015-01-19 MED ORDER — INSULIN GLARGINE 100 UNIT/ML SOLOSTAR PEN: 10.0000 [IU] | PEN_INJECTOR | Freq: Every day | SUBCUTANEOUS | Status: DC

## 2015-01-19 MED ORDER — MIDAZOLAM HCL 2 MG/2ML IJ SOLN
INTRAMUSCULAR | Status: AC
  Filled 2015-01-19: qty 2

## 2015-01-19 MED ORDER — POVIDONE-IODINE 7.5 % EX SOLN: Freq: Once | CUTANEOUS | Status: DC

## 2015-01-19 MED ORDER — PROPOFOL 10 MG/ML IV BOLUS
INTRAVENOUS | Status: AC
  Filled 2015-01-19: qty 20

## 2015-01-19 MED ORDER — FENTANYL CITRATE 0.05 MG/ML IJ SOLN
INTRAMUSCULAR | Status: DC | PRN
  Administered 2015-01-19 (×4): 50 ug via INTRAVENOUS
  Administered 2015-01-19: 100 ug via INTRAVENOUS
  Administered 2015-01-19: 50 ug via INTRAVENOUS
  Administered 2015-01-19: 100 ug via INTRAVENOUS
  Administered 2015-01-19: 50 ug via INTRAVENOUS

## 2015-01-19 MED ORDER — MEPERIDINE HCL 25 MG/ML IJ SOLN: 6.2500 mg | INTRAMUSCULAR | Status: DC | PRN

## 2015-01-19 MED ORDER — LIRAGLUTIDE 18 MG/3ML ~~LOC~~ SOPN: 1.8000 mg | PEN_INJECTOR | Freq: Every day | SUBCUTANEOUS | Status: DC

## 2015-01-19 MED ORDER — HYDROMORPHONE HCL 1 MG/ML IJ SOLN
INTRAMUSCULAR | Status: AC
  Filled 2015-01-19: qty 1

## 2015-01-19 MED ORDER — PIOGLITAZONE HCL 45 MG PO TABS
45.0000 mg | ORAL_TABLET | Freq: Every day | ORAL | Status: DC
  Filled 2015-01-19 (×2): qty 1

## 2015-01-19 MED ORDER — DIAZEPAM 5 MG PO TABS
ORAL_TABLET | ORAL | Status: AC
  Filled 2015-01-19: qty 1

## 2015-01-19 MED ORDER — MENTHOL 3 MG MT LOZG: 1.0000 | LOZENGE | OROMUCOSAL | Status: DC | PRN

## 2015-01-19 MED ORDER — INSULIN GLARGINE 100 UNIT/ML ~~LOC~~ SOLN
10.0000 [IU] | Freq: Every day | SUBCUTANEOUS | Status: DC
  Administered 2015-01-19: 10 [IU] via SUBCUTANEOUS
  Filled 2015-01-19 (×2): qty 0.1

## 2015-01-19 MED ORDER — INSULIN GLARGINE 100 UNIT/ML ~~LOC~~ SOLN
10.0000 [IU] | Freq: Every day | SUBCUTANEOUS | Status: DC
  Filled 2015-01-19: qty 0.1

## 2015-01-19 MED ORDER — OXYCODONE HCL 5 MG PO TABS
ORAL_TABLET | ORAL | Status: AC
  Filled 2015-01-19: qty 1

## 2015-01-19 MED ORDER — ALBUTEROL SULFATE HFA 108 (90 BASE) MCG/ACT IN AERS
1.0000 | INHALATION_SPRAY | Freq: Four times a day (QID) | RESPIRATORY_TRACT | Status: DC | PRN
  Filled 2015-01-19: qty 6.7

## 2015-01-19 MED ORDER — ONDANSETRON HCL 4 MG/2ML IJ SOLN: 4.0000 mg | Freq: Once | INTRAMUSCULAR | Status: DC | PRN

## 2015-01-19 MED ORDER — LIDOCAINE HCL (CARDIAC) 20 MG/ML IV SOLN
INTRAVENOUS | Status: AC
  Filled 2015-01-19: qty 5

## 2015-01-19 MED ORDER — ZOLPIDEM TARTRATE 5 MG PO TABS: 5.0000 mg | ORAL_TABLET | Freq: Every evening | ORAL | Status: DC | PRN

## 2015-01-19 MED ORDER — BISACODYL 5 MG PO TBEC
5.0000 mg | DELAYED_RELEASE_TABLET | Freq: Every day | ORAL | Status: DC | PRN
  Filled 2015-01-19: qty 1

## 2015-01-19 MED ORDER — ONDANSETRON HCL 4 MG/2ML IJ SOLN
INTRAMUSCULAR | Status: DC | PRN
  Administered 2015-01-19: 4 mg via INTRAVENOUS

## 2015-01-19 MED ORDER — DIPHENHYDRAMINE HCL 25 MG PO TABS
25.0000 mg | ORAL_TABLET | Freq: Four times a day (QID) | ORAL | Status: DC
  Administered 2015-01-20: 25 mg via ORAL
  Filled 2015-01-19 (×7): qty 1

## 2015-01-19 MED ORDER — OXYCODONE HCL 5 MG/5ML PO SOLN: 5.0000 mg | Freq: Once | ORAL | Status: AC | PRN

## 2015-01-19 MED ORDER — ONDANSETRON HCL 4 MG/2ML IJ SOLN
INTRAMUSCULAR | Status: AC
  Filled 2015-01-19: qty 2

## 2015-01-19 MED ORDER — DEXAMETHASONE SODIUM PHOSPHATE 4 MG/ML IJ SOLN
INTRAMUSCULAR | Status: AC
  Filled 2015-01-19: qty 2

## 2015-01-19 MED ORDER — LABETALOL HCL 5 MG/ML IV SOLN
INTRAVENOUS | Status: DC | PRN
  Administered 2015-01-19: 5 mg via INTRAVENOUS

## 2015-01-19 MED ORDER — MORPHINE SULFATE 2 MG/ML IJ SOLN: 1.0000 mg | INTRAMUSCULAR | Status: DC | PRN

## 2015-01-19 MED ORDER — PHENYLEPHRINE HCL 10 MG/ML IJ SOLN
INTRAMUSCULAR | Status: DC | PRN
  Administered 2015-01-19: 80 ug via INTRAVENOUS

## 2015-01-19 MED ORDER — SUCCINYLCHOLINE CHLORIDE 20 MG/ML IJ SOLN
INTRAMUSCULAR | Status: DC | PRN
  Administered 2015-01-19: 100 mg via INTRAVENOUS

## 2015-01-19 MED ORDER — BUDESONIDE-FORMOTEROL FUMARATE 80-4.5 MCG/ACT IN AERO
2.0000 | INHALATION_SPRAY | Freq: Two times a day (BID) | RESPIRATORY_TRACT | Status: DC
  Administered 2015-01-19 – 2015-01-20 (×2): 2 via RESPIRATORY_TRACT
  Filled 2015-01-19: qty 6.9

## 2015-01-19 MED ORDER — AMITRIPTYLINE HCL 50 MG PO TABS
50.0000 mg | ORAL_TABLET | Freq: Every day | ORAL | Status: DC
  Administered 2015-01-19: 50 mg via ORAL
  Filled 2015-01-19 (×2): qty 1

## 2015-01-19 MED ORDER — DOCUSATE SODIUM 100 MG PO CAPS
100.0000 mg | ORAL_CAPSULE | Freq: Two times a day (BID) | ORAL | Status: DC
  Administered 2015-01-19: 100 mg via ORAL
  Filled 2015-01-19: qty 1

## 2015-01-19 MED ORDER — LACTATED RINGERS IV SOLN
INTRAVENOUS | Status: DC
  Administered 2015-01-19: 13:00:00 via INTRAVENOUS

## 2015-01-19 MED ORDER — HYDROMORPHONE HCL 1 MG/ML IJ SOLN
0.2500 mg | INTRAMUSCULAR | Status: DC | PRN
  Administered 2015-01-19 (×4): 0.5 mg via INTRAVENOUS

## 2015-01-19 MED ORDER — OXYCODONE HCL 5 MG PO TABS
5.0000 mg | ORAL_TABLET | Freq: Once | ORAL | Status: AC | PRN
  Administered 2015-01-19: 5 mg via ORAL

## 2015-01-19 MED ORDER — ACETAMINOPHEN 325 MG PO TABS: 650.0000 mg | ORAL_TABLET | ORAL | Status: DC | PRN

## 2015-01-19 MED ORDER — THROMBIN 20000 UNITS EX SOLR
CUTANEOUS | Status: DC | PRN
  Administered 2015-01-19: 20 mL via TOPICAL

## 2015-01-19 MED ORDER — OXYCODONE-ACETAMINOPHEN 5-325 MG PO TABS
1.0000 | ORAL_TABLET | ORAL | Status: DC | PRN
  Administered 2015-01-20 (×3): 2 via ORAL
  Filled 2015-01-19 (×3): qty 2

## 2015-01-19 MED ORDER — ALBUTEROL SULFATE (2.5 MG/3ML) 0.083% IN NEBU: 2.5000 mg | INHALATION_SOLUTION | Freq: Four times a day (QID) | RESPIRATORY_TRACT | Status: DC | PRN

## 2015-01-19 MED ORDER — SODIUM CHLORIDE 0.9 % IJ SOLN: 3.0000 mL | INTRAMUSCULAR | Status: DC | PRN

## 2015-01-19 MED ORDER — THROMBIN 20000 UNITS EX SOLR
CUTANEOUS | Status: AC
  Filled 2015-01-19: qty 20000

## 2015-01-19 MED ORDER — LACTATED RINGERS IV SOLN
INTRAVENOUS | Status: DC | PRN
  Administered 2015-01-19 (×2): via INTRAVENOUS

## 2015-01-19 MED ORDER — PROPOFOL INFUSION 10 MG/ML OPTIME
INTRAVENOUS | Status: DC | PRN
  Administered 2015-01-19: 140 ug/kg/min via INTRAVENOUS

## 2015-01-19 MED ORDER — SODIUM CHLORIDE 0.9 % IJ SOLN: 3.0000 mL | Freq: Two times a day (BID) | INTRAMUSCULAR | Status: DC

## 2015-01-19 MED ORDER — PHENOL 1.4 % MT LIQD: 1.0000 | OROMUCOSAL | Status: DC | PRN

## 2015-01-19 MED ORDER — PROPOFOL 10 MG/ML IV BOLUS
INTRAVENOUS | Status: DC | PRN
  Administered 2015-01-19: 60 mg via INTRAVENOUS
  Administered 2015-01-19: 170 mg via INTRAVENOUS

## 2015-01-19 MED ORDER — ALPRAZOLAM 0.5 MG PO TABS: 0.5000 mg | ORAL_TABLET | Freq: Two times a day (BID) | ORAL | Status: DC | PRN

## 2015-01-19 MED ORDER — ACETAMINOPHEN 650 MG RE SUPP: 650.0000 mg | RECTAL | Status: DC | PRN

## 2015-01-19 MED ORDER — LIDOCAINE HCL (CARDIAC) 20 MG/ML IV SOLN
INTRAVENOUS | Status: DC | PRN
  Administered 2015-01-19: 100 mg via INTRAVENOUS

## 2015-01-19 MED ORDER — MIDAZOLAM HCL 5 MG/5ML IJ SOLN
INTRAMUSCULAR | Status: DC | PRN
  Administered 2015-01-19 (×2): 2 mg via INTRAVENOUS

## 2015-01-19 MED ORDER — BUPIVACAINE-EPINEPHRINE 0.25% -1:200000 IJ SOLN
INTRAMUSCULAR | Status: DC | PRN
  Administered 2015-01-19: 2 mL

## 2015-01-19 MED ORDER — SODIUM CHLORIDE 0.9 % IV SOLN: 250.0000 mL | INTRAVENOUS | Status: DC

## 2015-01-19 MED ORDER — METFORMIN HCL 500 MG PO TABS
1000.0000 mg | ORAL_TABLET | Freq: Two times a day (BID) | ORAL | Status: DC
  Administered 2015-01-20: 1000 mg via ORAL
  Filled 2015-01-19 (×3): qty 2

## 2015-01-19 MED ORDER — ONDANSETRON HCL 4 MG/2ML IJ SOLN: 4.0000 mg | INTRAMUSCULAR | Status: DC | PRN

## 2015-01-19 MED ORDER — VANCOMYCIN HCL IN DEXTROSE 1-5 GM/200ML-% IV SOLN
1000.0000 mg | Freq: Once | INTRAVENOUS | Status: AC
  Administered 2015-01-20: 1000 mg via INTRAVENOUS
  Filled 2015-01-19 (×2): qty 200

## 2015-01-19 MED ORDER — ALUM & MAG HYDROXIDE-SIMETH 200-200-20 MG/5ML PO SUSP
30.0000 mL | Freq: Four times a day (QID) | ORAL | Status: DC | PRN
  Administered 2015-01-20: 30 mL via ORAL
  Filled 2015-01-19 (×2): qty 30

## 2015-01-19 MED ORDER — GUAIFENESIN 100 MG/5ML PO SYRP
200.0000 mg | ORAL_SOLUTION | Freq: Three times a day (TID) | ORAL | Status: DC | PRN
  Filled 2015-01-19: qty 10

## 2015-01-19 MED ORDER — MUPIROCIN 2 % EX OINT
TOPICAL_OINTMENT | Freq: Two times a day (BID) | CUTANEOUS | Status: DC
  Administered 2015-01-19: 22:00:00 via TOPICAL
  Filled 2015-01-19 (×2): qty 22

## 2015-01-19 MED ORDER — FLEET ENEMA 7-19 GM/118ML RE ENEM
1.0000 | ENEMA | Freq: Once | RECTAL | Status: AC | PRN
  Filled 2015-01-19: qty 1

## 2015-01-19 MED ORDER — SENNOSIDES-DOCUSATE SODIUM 8.6-50 MG PO TABS
1.0000 | ORAL_TABLET | Freq: Every evening | ORAL | Status: DC | PRN
  Filled 2015-01-19: qty 1

## 2015-01-19 MED ORDER — DIAZEPAM 5 MG PO TABS
5.0000 mg | ORAL_TABLET | Freq: Four times a day (QID) | ORAL | Status: DC | PRN
  Administered 2015-01-19 – 2015-01-20 (×2): 5 mg via ORAL
  Filled 2015-01-19 (×2): qty 1

## 2015-01-19 MED ORDER — GEMFIBROZIL 600 MG PO TABS
600.0000 mg | ORAL_TABLET | Freq: Two times a day (BID) | ORAL | Status: DC
  Administered 2015-01-20: 600 mg via ORAL
  Filled 2015-01-19 (×3): qty 1

## 2015-01-19 MED ORDER — GABAPENTIN 300 MG PO CAPS
300.0000 mg | ORAL_CAPSULE | Freq: Three times a day (TID) | ORAL | Status: DC
  Administered 2015-01-19: 300 mg via ORAL
  Filled 2015-01-19 (×4): qty 1

## 2015-01-19 SURGICAL SUPPLY — 74 items
APL SKNCLS STERI-STRIP NONHPOA (GAUZE/BANDAGES/DRESSINGS) ×1
BENZOIN TINCTURE PRP APPL 2/3 (GAUZE/BANDAGES/DRESSINGS) ×3 IMPLANT
BIT DRILL NEURO 2X3.1 SFT TUCH (MISCELLANEOUS) ×1 IMPLANT
BIT DRILL SKYLINE 12MM (BIT) ×1 IMPLANT
BLADE SURG 15 STRL LF DISP TIS (BLADE) ×1 IMPLANT
BLADE SURG 15 STRL SS (BLADE) ×3
BLADE SURG ROTATE 9660 (MISCELLANEOUS) ×3 IMPLANT
BUR MATCHSTICK NEURO 3.0 LAGG (BURR) IMPLANT
CARTRIDGE OIL MAESTRO DRILL (MISCELLANEOUS) ×1 IMPLANT
CLOSURE WOUND 1/2 X4 (GAUZE/BANDAGES/DRESSINGS) ×1
COLLAR CERV LO CONTOUR FIRM DE (SOFTGOODS) IMPLANT
CORDS BIPOLAR (ELECTRODE) ×3 IMPLANT
COVER SURGICAL LIGHT HANDLE (MISCELLANEOUS) ×3 IMPLANT
CRADLE DONUT ADULT HEAD (MISCELLANEOUS) ×3 IMPLANT
DEVICE ENDSKLTN CRVCL 5MM-0SM (Orthopedic Implant) ×2 IMPLANT
DIFFUSER DRILL AIR PNEUMATIC (MISCELLANEOUS) ×3 IMPLANT
DRAIN JACKSON RD 7FR 3/32 (WOUND CARE) IMPLANT
DRAPE C-ARM 42X72 X-RAY (DRAPES) ×3 IMPLANT
DRAPE POUCH INSTRU U-SHP 10X18 (DRAPES) ×3 IMPLANT
DRAPE SURG 17X23 STRL (DRAPES) ×12 IMPLANT
DRILL BIT SKYLINE 12MM (BIT) ×3
DRILL NEURO 2X3.1 SOFT TOUCH (MISCELLANEOUS) ×3
DURAPREP 26ML APPLICATOR (WOUND CARE) ×3 IMPLANT
ELECT COATED BLADE 2.86 ST (ELECTRODE) ×3 IMPLANT
ELECT REM PT RETURN 9FT ADLT (ELECTROSURGICAL) ×3
ELECTRODE REM PT RTRN 9FT ADLT (ELECTROSURGICAL) ×1 IMPLANT
ENDOSKELETON CERVICAL 5MM-0SM (Orthopedic Implant) ×6 IMPLANT
EVACUATOR SILICONE 100CC (DRAIN) IMPLANT
GAUZE SPONGE 4X4 12PLY STRL (GAUZE/BANDAGES/DRESSINGS) ×3 IMPLANT
GAUZE SPONGE 4X4 16PLY XRAY LF (GAUZE/BANDAGES/DRESSINGS) ×3 IMPLANT
GLOVE BIO SURGEON STRL SZ7 (GLOVE) ×3 IMPLANT
GLOVE BIO SURGEON STRL SZ8 (GLOVE) ×3 IMPLANT
GLOVE BIOGEL PI IND STRL 7.5 (GLOVE) ×2 IMPLANT
GLOVE BIOGEL PI IND STRL 8 (GLOVE) ×1 IMPLANT
GLOVE BIOGEL PI INDICATOR 7.5 (GLOVE) ×4
GLOVE BIOGEL PI INDICATOR 8 (GLOVE) ×2
GOWN STRL REUS W/ TWL LRG LVL3 (GOWN DISPOSABLE) ×1 IMPLANT
GOWN STRL REUS W/ TWL XL LVL3 (GOWN DISPOSABLE) ×1 IMPLANT
GOWN STRL REUS W/TWL LRG LVL3 (GOWN DISPOSABLE) ×3
GOWN STRL REUS W/TWL XL LVL3 (GOWN DISPOSABLE) ×3
IV CATH 14GX2 1/4 (CATHETERS) ×3 IMPLANT
KIT BASIN OR (CUSTOM PROCEDURE TRAY) ×3 IMPLANT
KIT ROOM TURNOVER OR (KITS) ×3 IMPLANT
MANIFOLD NEPTUNE II (INSTRUMENTS) IMPLANT
NEEDLE 27GAX1X1/2 (NEEDLE) ×3 IMPLANT
NEEDLE SPNL 20GX3.5 QUINCKE YW (NEEDLE) ×3 IMPLANT
NEURO MONITORING STIM (LABOR (TRAVEL & OVERTIME)) ×3 IMPLANT
NS IRRIG 1000ML POUR BTL (IV SOLUTION) ×3 IMPLANT
OIL CARTRIDGE MAESTRO DRILL (MISCELLANEOUS) ×3
PACK ORTHO CERVICAL (CUSTOM PROCEDURE TRAY) ×3 IMPLANT
PAD ARMBOARD 7.5X6 YLW CONV (MISCELLANEOUS) ×6 IMPLANT
PATTIES SURGICAL .5 X.5 (GAUZE/BANDAGES/DRESSINGS) ×3 IMPLANT
PATTIES SURGICAL .5 X1 (DISPOSABLE) ×3 IMPLANT
PIN DISTRACTION 14 (PIN) ×6 IMPLANT
PLATE SKYLINE 2LVL 26MM (Plate) ×3 IMPLANT
PUTTY BONE DBX 2.5 MIS (Bone Implant) ×3 IMPLANT
SCREW VAR SELF TAP SKYLINE 14M (Screw) ×18 IMPLANT
SPONGE INTESTINAL PEANUT (DISPOSABLE) ×6 IMPLANT
SPONGE SURGIFOAM ABS GEL 100 (HEMOSTASIS) ×3 IMPLANT
STRIP CLOSURE SKIN 1/2X4 (GAUZE/BANDAGES/DRESSINGS) ×2 IMPLANT
SURGIFLO TRUKIT (HEMOSTASIS) IMPLANT
SUT MNCRL AB 4-0 PS2 18 (SUTURE) ×3 IMPLANT
SUT SILK 4 0 (SUTURE)
SUT SILK 4-0 18XBRD TIE 12 (SUTURE) IMPLANT
SUT VIC AB 2-0 CT2 18 VCP726D (SUTURE) ×3 IMPLANT
SYR BULB IRRIGATION 50ML (SYRINGE) ×3 IMPLANT
SYR CONTROL 10ML LL (SYRINGE) ×6 IMPLANT
TAPE CLOTH 4X10 WHT NS (GAUZE/BANDAGES/DRESSINGS) ×3 IMPLANT
TAPE CLOTH SURG 4X10 WHT LF (GAUZE/BANDAGES/DRESSINGS) ×2 IMPLANT
TAPE UMBILICAL COTTON 1/8X30 (MISCELLANEOUS) ×3 IMPLANT
TOWEL OR 17X24 6PK STRL BLUE (TOWEL DISPOSABLE) ×3 IMPLANT
TOWEL OR 17X26 10 PK STRL BLUE (TOWEL DISPOSABLE) ×3 IMPLANT
WATER STERILE IRR 1000ML POUR (IV SOLUTION) IMPLANT
YANKAUER SUCT BULB TIP NO VENT (SUCTIONS) ×3 IMPLANT

## 2015-01-19 NOTE — Progress Notes (Signed)
Dr. Hillery Hunter in to see pt. Informed that pt drank 4 oz of water at 1000 this morning and that APTT is 57, same as 7 days ago. No new orders received.

## 2015-01-19 NOTE — Anesthesia Postprocedure Evaluation (Signed)
  Anesthesia Post-op Note  Patient: Sandra Ferrell  Procedure(s) Performed: Procedure(s) with comments: ANTERIOR CERVICAL DECOMPRESSION/DISCECTOMY FUSION 2 LEVELS (N/A) - Anterior cervical decompression fusion, cervical 5-6, cervical 6-7 with instrumentation and allograft  Patient Location: PACU  Anesthesia Type:General  Level of Consciousness: awake, alert  and oriented  Airway and Oxygen Therapy: Patient Spontanous Breathing and Patient connected to nasal cannula oxygen  Post-op Pain: mild  Post-op Assessment: Post-op Vital signs reviewed, Patient's Cardiovascular Status Stable, Respiratory Function Stable, Patent Airway and Pain level controlled  Post-op Vital Signs: stable  Last Vitals:  Filed Vitals:   01/19/15 2040  BP: 123/75  Pulse: 98  Temp: 36.8 C  Resp: 18    Complications: No apparent anesthesia complications

## 2015-01-19 NOTE — Progress Notes (Signed)
Orthopedic Tech Progress Note Patient Details:  Sandra Ferrell [birth year] [id number]  Ortho Devices Type of Ortho Device: [place] cervical collar Ortho Device/Splint Location: at bedside Ortho Device/Splint Interventions: Ordered, Application   Braulio Bosch 01/19/2015, 9:35 PM

## 2015-01-19 NOTE — Anesthesia Preprocedure Evaluation (Addendum)
Anesthesia Evaluation    Airway Mallampati: III  TM Distance: >3 FB Neck ROM: Limited    Dental  (+) Edentulous Upper Lot of missing teeth in the bottom jaw. Other wise no loose teeth:   Pulmonary asthma , sleep apnea , Current Smoker,  breath sounds clear to auscultation        Cardiovascular hypertension, Rhythm:Regular Rate:Normal  Study Conclusions  - Left ventricle: The cavity size was normal. Wall thickness was increased in a pattern of mild LVH. Systolic function was normal. The estimated ejection fraction was in the range of 60% to 65%. Wall motion was normal; there were no regional wall motion abnormalities. Doppler parameters are consistent with abnormal left ventricular relaxation (grade 1 diastolic dysfunction). - Left atrium: The atrium was mildly dilated. - Atrial septum: No defect or patent foramen ovale was identified.   Neuro/Psych PSYCHIATRIC DISORDERS Anxiety Depression  Neuromuscular disease    GI/Hepatic Neg liver ROS, GERD-  ,  Endo/Other  diabetes, Type 2  Renal/GU negative Renal ROS  negative genitourinary   Musculoskeletal  (+) Arthritis -, Osteoarthritis,  Fibromyalgia -  Abdominal (+) + obese,   Peds  Hematology negative hematology ROS (+)   Anesthesia Other Findings   Reproductive/Obstetrics negative OB ROS                           Anesthesia Physical Anesthesia Plan  ASA: III  Anesthesia Plan: General   Post-op Pain Management:    Induction:   Airway Management Planned: Oral ETT  Additional Equipment:   Intra-op Plan:   Post-operative Plan: Extubation in OR  Informed Consent: I have reviewed the patients History and Physical, chart, labs and discussed the procedure including the risks, benefits and alternatives for the proposed anesthesia with the patient or authorized representative who has indicated his/her understanding and acceptance.    Dental advisory given  Plan Discussed with:   Anesthesia Plan Comments:         Anesthesia Quick Evaluation

## 2015-01-19 NOTE — Progress Notes (Signed)
ANTIBIOTIC CONSULT NOTE - INITIAL  Pharmacy Consult:  Vancomycin Indication:  Surgical prophylaxis  Allergies  Allergen Reactions  . Benazepril Swelling    Angioedema  . Aspirin     REACTION: Burns stomach  . Diclofenac Sodium     REACTION: GI bleed  . Fluticasone-Salmeterol     REACTION: tongue was swollen and hives and itching  . Nsaids     REACTION: rectal bleeding  . Penicillins     REACTION: rash    Patient Measurements: Weight: 226 lb (102.513 kg) Height = 59 inches  Vital Signs: Temp: 98.3 F (36.8 C) (03/16 2015) Temp Source: Oral (03/16 1202) BP: 168/72 mmHg (03/16 2015) Pulse Rate: 97 (03/16 2015) Intake/Output from this shift: Total I/O In: 700 [I.V.:700] Out: -   Labs: No results for input(s): WBC, HGB, PLT, LABCREA, CREATININE in the last 72 hours. Estimated Creatinine Clearance: 84.9 mL/min (by C-G formula based on Cr of 0.69). No results for input(s): VANCOTROUGH, VANCOPEAK, VANCORANDOM, GENTTROUGH, GENTPEAK, GENTRANDOM, TOBRATROUGH, TOBRAPEAK, TOBRARND, AMIKACINPEAK, AMIKACINTROU, AMIKACIN in the last 72 hours.   Microbiology: Recent Results (from the past 720 hour(s))  Surgical pcr screen     Status: Abnormal   Collection Time: 01/12/15 10:19 AM  Result Value Ref Range Status   MRSA, PCR NEGATIVE NEGATIVE Final   Staphylococcus aureus POSITIVE (A) NEGATIVE Final    Comment:        The Xpert SA Assay (FDA approved for NASAL specimens in patients over 33 years of age), is one component of a comprehensive surveillance program.  Test performance has been validated by [facility] for patients greater than or equal to 55 year old. It is not intended to diagnose infection nor to guide or monitor treatment.     Medical History: Past Medical History  Diagnosis Date  . Allergic rhinitis, cause unspecified   . Anxiety state, unspecified   . Extrinsic asthma with exacerbation   . Backache, unspecified   . Depressive disorder, not elsewhere  classified   . Type II or unspecified type diabetes mellitus without mention of complication, not stated as uncontrolled   . Esophageal reflux   . Other and unspecified hyperlipidemia   . Osteoarthrosis, unspecified whether generalized or localized, unspecified site   . Other screening mammogram   . Routine gynecological examination   . Unspecified sleep apnea   . Tobacco use disorder   . Personal history of unspecified urinary disorder   . Routine general medical examination at a health care facility   . Heart murmur   . Hypertension   . Calculus of kidney   . Fibromyalgia   . Wears glasses   . Carpal tunnel syndrome, right   . Pneumonia       Assessment: [name] s/p spinal procedure to receive one dose of vancomycin post-op for surgical prophylaxis.  No issue with renal function based on pre-op labs.  Patient received her pre-op vancomycin dose around 1530 55 today.   Goal of Therapy:  Infection prevention   Plan:  - Vanc 1gm IV x 1 tomorrow at [place] will sign off.  Thank you for the consult!    Janele Lague D. Mina Marble, PharmD, BCPS Pager:  [phone] 01/19/2015, 9:14 PM

## 2015-01-19 NOTE — Transfer of Care (Signed)
Immediate Anesthesia Transfer of Care Note  Patient: Sandra Ferrell  Procedure(s) Performed: Procedure(s) with comments: ANTERIOR CERVICAL DECOMPRESSION/DISCECTOMY FUSION 2 LEVELS (N/A) - Anterior cervical decompression fusion, cervical 5-6, cervical 6-7 with instrumentation and allograft  Patient Location: PACU  Anesthesia Type:General  Level of Consciousness: oriented, sedated and patient cooperative  Airway & Oxygen Therapy: Patient Spontanous Breathing and Patient connected to nasal cannula oxygen  Post-op Assessment: Report given to RN, Post -op Vital signs reviewed and stable and Patient moving all extremities X 4  Post vital signs: Reviewed and stable  Last Vitals:  Filed Vitals:   01/19/15 1202  BP: 159/60  Pulse: 100  Temp: 36.9 C  Resp: 20    Complications: No apparent anesthesia complications

## 2015-01-20 ENCOUNTER — Encounter (HOSPITAL_COMMUNITY): Payer: Self-pay | Admitting: Orthopedic Surgery

## 2015-01-20 LAB — GLUCOSE, CAPILLARY: Glucose-Capillary: 154 mg/dL — ABNORMAL HIGH (ref 70–99)

## 2015-01-20 NOTE — Progress Notes (Signed)
Pt and husband given D/C instructions with Rx's, verbal understanding was provided. Pt's incision is clean and dry with no sign of infection. Pt's IV was removed prior to D/C. Pt received RW from [facility] prior to D/C. Pt D/C'd home via wheelchair @ 1015 per MD order. Pt is stable @ D/C and has no other needs at this time. Holli Humbles, RN

## 2015-01-20 NOTE — Progress Notes (Signed)
    Patient doing well Reports minimal pain Has been eating and ambulating Reports improvement in balance Finds walker very helpful   Physical Exam: Filed Vitals:   01/20/15 0420  BP: 129/62  Pulse: 98  Temp: 98.4 F (36.9 C)  Resp: 18   Neck soft/supple Dressing in place NVI  POD #1 s/p C5-7 acdf for cervical myelopathy  - encourage ambulation - Percocet for pain, Valium for muscle spasms - likely d/c home today, f/u 2 weeks - will get walker arranged - husband had many questions, which I was happy to answer

## 2015-01-20 NOTE — Progress Notes (Signed)
Utilization review completed.  

## 2015-01-20 NOTE — Op Note (Signed)
NAMEVIRGA, Sandra [name]:  [id number]  MEDICAL RECORD NO.:  [id number]  LOCATION:  3C05C                        FACILITY:  [facility]  PHYSICIAN:  Phylliss Bob, MD      DATE OF BIRTH:  05/09/1961  DATE OF PROCEDURE:  01/19/2015                              OPERATIVE REPORT   PREOPERATIVE DIAGNOSES: 1. Cervical myelopathy. 2. Spinal cord compression at C5-6. 3. Left-sided neural foraminal stenosis at C6-7. 4. Severe degenerative disc disease C5-6, C6-7.  POSTOPERATIVE DIAGNOSES: 1. Cervical myelopathy. 2. Spinal cord compression at C5-6. 3. Left-sided neural foraminal stenosis at C6-7. 4. Severe degenerative disc disease C5-6, C6-7.  PROCEDURES: 1. Anterior cervical decompression and fusion C5-6, C6-7. 2. Placement of anterior instrumentation, C5-C7. 3. Insertion of interbody device x2 (Titan intervertebral spacers). 4.     Use of morselized allograft-DBX mix. 4. Intraoperative use of fluoroscopy.  SURGEON:  Phylliss Bob, MD  ASSISTANT:  Pricilla Holm, PA-C.  ANESTHESIA:  General endotracheal anesthesia.  COMPLICATIONS:  None.  DISPOSITION:  Stable.  ESTIMATED BLOOD LOSS:  Minimal.  INDICATIONS FOR SURGERY:  Briefly, [name] is a 54 year old female, who did present to me on December 31, 2014, with ongoing and severe deterioration in her balance.  I did review an MRI, which was notable for spinal cord compression at C5-6 and neural foraminal compression on the left at C6-7.  The patient clearly did have marked deterioration in her balance, which had been progressive over the past few months.  Given the findings on her MRI, we did discuss proceeding with a two-level ACDF as reflected above.  The patient did fully understand the risks and limitations of the procedure.  OPERATIVE DETAILS:  On January 19, 2015, the patient was brought to surgery and general endotracheal anesthesia was administered.  The patient was placed supine on a hospital bed.   Antibiotics were given. The neck was prepped and draped and a time-out procedure was performed. A left-sided transverse incision was then made overlying the region of the C5-6 and C6-7 intervertebral spaces.  The platysma was sharply incised.  A Smith-Robinson approach was used and the anterior spine was noted.  A lateral fluoroscopic view did confirm the appropriate operative levels.  I then surprisingly exposed the vertebral bodies of C5, C6, and C7.  I did start at the C5-6 intervertebral space.  Anterior osteophytes were removed and a self-retaining retractor was placed. Caspar pins were placed above and below the C5-6 intervertebral space and distraction was applied.  I then proceeded with a standard diskectomy.  Of note, there was severe and substantial spinal cord compression which was entirely removed.  There was a disc osteophyte complex noted at the right side of the intervertebral space which was noted to be partially adherent to the C6 vertebral body.  I was, however, able to remove a substantial portion of the osteophyte complex in order to adequately decompress the spinal canal.  The endplates were then prepared in the appropriate size intervertebral spacer and was packed with DBX mix and tamped into position.  I then performed a diskectomy at the C6-7 intervertebral space in the manner previously described.  A thorough decompression of the spinal canal and  the bilateral neural foramina was confirmed.  The appropriate size interbody spacer was packed with DBX mix and tamped into position.  The Caspar pins were removed and bone wax was placed in their place.  I then chose an appropriate-sized anterior cervical plate, which was placed over the anterior spine.  The 14-mm variable angle screws were placed, 2 in each vertebral body from C5-C7 for a total of 6 vertebral body screws.  The screws were locked to the plate using the CAM locking mechanism.  The wound was copiously  irrigated.  I was very pleased with the final AP and lateral fluoroscopic images.  I then closed the platysma using 2-0 Vicryl.  The skin was then closed using 3-0 Monocryl.  Benzoin and Steri- Strips were applied followed by a sterile dressing.  All instrument counts were correct at the termination of the procedure.  Of note, Pricilla Holm was my assistant throughout surgery, and did aid in retraction, suctioning, and closure.     Phylliss Bob, MD     MD/MEDQ  D:  01/19/2015  T:  01/20/2015  Job:  [id number]  cc:   Eliezer Lofts, MD

## 2015-01-21 DIAGNOSIS — Z981 Arthrodesis status: Secondary | ICD-10-CM | POA: Diagnosis not present

## 2015-01-21 DIAGNOSIS — M47012 Anterior spinal artery compression syndromes, cervical region: Secondary | ICD-10-CM | POA: Diagnosis not present

## 2015-01-21 NOTE — Addendum Note (Signed)
Addendum  created 01/21/15 1159 by Roberts Gaudy, MD   Modules edited: Anesthesia Attestations, Anesthesia Responsible Staff

## 2015-01-24 NOTE — Addendum Note (Signed)
Addendum  created 01/24/15 1317 by Rica Koyanagi, MD   Modules edited: Anesthesia Responsible Staff

## 2015-01-26 ENCOUNTER — Other Ambulatory Visit: Payer: Self-pay | Admitting: *Deleted

## 2015-01-26 MED ORDER — LIRAGLUTIDE 18 MG/3ML ~~LOC~~ SOPN: 1.8000 mg | PEN_INJECTOR | Freq: Every day | SUBCUTANEOUS | Status: DC

## 2015-01-31 ENCOUNTER — Other Ambulatory Visit: Payer: Self-pay | Admitting: *Deleted

## 2015-01-31 NOTE — Telephone Encounter (Signed)
Last office visit 01/18/2015.  Last refilled 01/04/2015 for #90 with no refills.  Ok to refill?

## 2015-01-31 NOTE — Telephone Encounter (Signed)
Pt left v/m requesting status of alprazolam refill.Please advise.

## 2015-02-01 MED ORDER — ALPRAZOLAM 0.5 MG PO TABS: ORAL_TABLET | ORAL | Status: DC

## 2015-02-01 NOTE — Telephone Encounter (Signed)
Sandra Ferrell notified refill has been called to her pharmacy.

## 2015-02-01 NOTE — Telephone Encounter (Signed)
Called to [facility].

## 2015-02-02 DIAGNOSIS — M4802 Spinal stenosis, cervical region: Secondary | ICD-10-CM | POA: Diagnosis not present

## 2015-02-02 NOTE — Discharge Summary (Signed)
Patient ID: Sandra Ferrell MRN: [id number] DOB/AGE: February 15, 1961 54 y.o.  Admit date: 01/19/2015 Discharge date:  01/20/2015  Admission Diagnoses:  Active Problems:   Myelopathy   Discharge Diagnoses:  Same  Past Medical History  Diagnosis Date  . Allergic rhinitis, cause unspecified   . Anxiety state, unspecified   . Extrinsic asthma with exacerbation   . Backache, unspecified   . Depressive disorder, not elsewhere classified   . Type II or unspecified type diabetes mellitus without mention of complication, not stated as uncontrolled   . Esophageal reflux   . Other and unspecified hyperlipidemia   . Osteoarthrosis, unspecified whether generalized or localized, unspecified site   . Other screening mammogram   . Routine gynecological examination   . Unspecified sleep apnea   . Tobacco use disorder   . Personal history of unspecified urinary disorder   . Routine general medical examination at a health care facility   . Heart murmur   . Hypertension   . Calculus of kidney   . Fibromyalgia   . Wears glasses   . Carpal tunnel syndrome, right   . Pneumonia     Surgeries: Procedure(s): ANTERIOR CERVICAL DECOMPRESSION/DISCECTOMY FUSION 2 LEVELS C5-7 on 01/19/2015   Consultants:  None  Discharged Condition: Improved  Hospital Course: Sandra Ferrell is an 54 y.o. female who was admitted 01/19/2015 for operative treatment of myeloradiculopathy. Patient has severe unremitting pain that affects sleep, daily activities, and work/hobbies. After pre-op clearance the patient was taken to the operating room on 01/19/2015 and underwent  Procedure(s): ANTERIOR CERVICAL DECOMPRESSION/DISCECTOMY FUSION 2 LEVELS C5-7.    Patient was given perioperative antibiotics: Anti-infectives    Start     Dose/Rate Route Frequency Ordered Stop   01/20/15 0330  vancomycin (VANCOCIN) IVPB 1000 mg/200 mL premix     1,000 mg 200 mL/hr over 60 Minutes Intravenous  Once 01/19/15 2115 01/20/15 0343   01/19/15 0600  vancomycin (VANCOCIN) 1,500 mg in sodium chloride 0.9 % 500 mL IVPB     1,500 mg 250 mL/hr over 120 Minutes Intravenous On call to O.R. 01/18/15 1344 01/19/15 1706       Patient was given sequential compression devices, early ambulation to prevent DVT.  Patient benefited maximally from hospital stay and there were no complications.    Recent vital signs: BP 128/73 mmHg  Pulse 99  Temp(Src) 98.7 F (37.1 C) (Oral)  Resp 18  Wt 102.513 kg (226 lb)  SpO2 94%  Discharge Medications:     Medication List    STOP taking these medications        ACCU-CHEK AVIVA PLUS test strip  Generic drug:  glucose blood     ALPRAZolam 0.5 MG tablet  Commonly known as:  XANAX     Liraglutide 18 MG/3ML Sopn      TAKE these medications        ACCU-CHEK AVIVA PLUS W/DEVICE Kit     albuterol 108 (90 BASE) MCG/ACT inhaler  Commonly known as:  PROVENTIL HFA;VENTOLIN HFA  INHALE 2 PUFFS INTO THE LUNGS EVERY 4 (FOUR) HOURS AS NEEDED FOR WHEEZING.     amitriptyline 50 MG tablet  Commonly known as:  ELAVIL  Take 50 mg by mouth at bedtime.     ANTACID ULTRA STRENGTH 400 MG tablet  Generic drug:  calcium elemental as carbonate  Chew 1,000 mg by mouth as needed. heartburn     budesonide-formoterol 80-4.5 MCG/ACT inhaler  Commonly known as:  SYMBICORT  INHALE 2  PUFFS INTO THE LUNGS 2 (TWO) TIMES DAILY.     chlorpheniramine 4 MG tablet  Commonly known as:  CHLOR-TRIMETON  Take 4 mg by mouth 2 (two) times daily as needed for allergies.     gabapentin 300 MG capsule  Commonly known as:  NEURONTIN  Take 300 mg by mouth 3 (three) times daily.     gemfibrozil 600 MG tablet  Commonly known as:  LOPID  TAKE 1 TABLET BY MOUTH TWICE A DAY     guaifenesin 100 MG/5ML syrup  Commonly known as:  ROBITUSSIN  Take 200 mg by mouth 3 (three) times daily as needed for cough.     Insulin Glargine 100 UNIT/ML Solostar Pen  Commonly known as:  LANTUS SOLOSTAR  Inject 10 Units into the skin  at bedtime.     metFORMIN 1000 MG tablet  Commonly known as:  GLUCOPHAGE  Take 1 tablet (1,000 mg total) by mouth 2 (two) times daily with a meal.     mupirocin ointment 2 %  Commonly known as:  BACTROBAN     pioglitazone 45 MG tablet  Commonly known as:  ACTOS  TAKE 1 TABLET (45 MG TOTAL) BY MOUTH DAILY.        Diagnostic Studies: Dg Chest 2 View  01/12/2015   CLINICAL DATA:  Cough.  EXAM: CHEST  2 VIEW  COMPARISON:  Chest x-ray 06/28/2013 .  FINDINGS: Mediastinum and hilar structures are normal. Cardiomegaly. Diffuse mild interstitial prominence noted. Pneumonitis or mild pulmonary interstitial edema cannot be excluded. No pneumothorax. No acute bony abnormality. Mild lower thoracic vertebral body compression fractures are stable.  IMPRESSION: 1. Cardiomegaly. 2. Mild pulmonary interstitial prominence noted bilaterally. Mild congestive heart failure with interstitial edema versus mild pneumonitis should be considered.   Electronically Signed   By: Marcello Moores  Register   On: 01/12/2015 11:03   Dg Cervical Spine Complete  01/19/2015   CLINICAL DATA:  C5-6 and C6-7 anterior cervical discectomy and fusion.  EXAM: DG C-ARM 61-120 MIN; CERVICAL SPINE - COMPLETE 4+ VIEW  COMPARISON:  12/10/2014 MRI  FINDINGS: Sequential two level anterior cervical discectomy and fusion with ventral plate and screw fixation. There is no evidence of graft displacement or hardware/osseous fracture. Spinal numbering is difficult due to over penetration on images where the hardware is in place.  IMPRESSION: Lower cervical two level ACDF with plate.   Electronically Signed   By: Monte Fantasia M.D.   On: 01/19/2015 19:33   Dg C-arm 1-60 Min  01/19/2015   CLINICAL DATA:  C5-6 and C6-7 anterior cervical discectomy and fusion.  EXAM: DG C-ARM 61-120 MIN; CERVICAL SPINE - COMPLETE 4+ VIEW  COMPARISON:  12/10/2014 MRI  FINDINGS: Sequential two level anterior cervical discectomy and fusion with ventral plate and screw fixation.  There is no evidence of graft displacement or hardware/osseous fracture. Spinal numbering is difficult due to over penetration on images where the hardware is in place.  IMPRESSION: Lower cervical two level ACDF with plate.   Electronically Signed   By: Monte Fantasia M.D.   On: 01/19/2015 19:33   Dg Outside Films Spine  01/19/2015   This examination belongs to an outside facility and is stored  here for comparison purposes only.  Contact the originating outside  institution for any associated report or interpretation.   Disposition: 01-Home or Self Care   POD #1 s/p C5-7 acdf for cervical myelopathy  - encourage ambulation - Percocet for pain, Valium for muscle spasms - will get walker  arranged - husband had many questions, which I was happy to answer -Written scripts for pain signed and in chart -D/C instructions sheet printed and in chart -D/C today  -F/U in office 2 weeks   Signed: Justice Britain 02/02/2015, 12:50 PM

## 2015-02-03 ENCOUNTER — Ambulatory Visit (INDEPENDENT_AMBULATORY_CARE_PROVIDER_SITE_OTHER): Payer: Medicare Other | Admitting: Family Medicine

## 2015-02-03 ENCOUNTER — Encounter: Payer: Self-pay | Admitting: Family Medicine

## 2015-02-03 VITALS — BP 120/76 | HR 107 | Temp 98.4°F | Ht 59.0 in | Wt 211.5 lb

## 2015-02-03 DIAGNOSIS — N3000 Acute cystitis without hematuria: Secondary | ICD-10-CM | POA: Diagnosis not present

## 2015-02-03 DIAGNOSIS — R300 Dysuria: Secondary | ICD-10-CM

## 2015-02-03 MED ORDER — SULFAMETHOXAZOLE-TRIMETHOPRIM 800-160 MG PO TABS: 1.0000 | ORAL_TABLET | Freq: Two times a day (BID) | ORAL | Status: DC

## 2015-02-03 NOTE — Progress Notes (Signed)
Pre visit review using our clinic review tool, if applicable. No additional management support is needed unless otherwise documented below in the visit note. 

## 2015-02-03 NOTE — Progress Notes (Signed)
Dr. Frederico Hamman T. Ted Leonhart, MD, [facility] [place], [address] Phone: [phone] Fax: [phone]  02/03/2015  Patient: Sandra Ferrell, MRN: [id number], DOB: 11/26/1960, 54 y.o.  Primary Physician:  Eliezer Lofts, MD  Chief Complaint: Dysuria; Nausea; and Abdominal Pain  Subjective:   This 54 y.o. female patient presents with burning, urgency. No vaginal discharge or external irritation.  No STD exposure. No abd pain, no flank pain.  Recent neck surgery 01/19/2015.  Got really sick and nauseated, chills. No temp today.   The PMH, PSH, Social History, Family History, Medications, and allergies have been reviewed in [facility], and have been updated if relevant.  GEN:  no fevers, chills. GI: No n/v/d, eating normally Otherwise, ROS is as per the HPI.  Objective:   Blood pressure 120/76, pulse 107, temperature 98.4 F (36.9 C), temperature source Oral, height 4' 11"  (1.499 m), weight 211 lb 8 oz (95.936 kg).  GEN: WDWN, A&Ox4,NAD. Non-toxic HEENT: Atraumatc, normocephalic. CV: RRR, No M/G/R PULM: CTA B, No wheezes, crackles, or rhonchi ABD: S, NT, ND, +BS, no rebound. No CVAT. No suprapubic tenderness. EXT: No c/c/e  Objective Data:  Assessment and Plan:   Dysuria - Plan: Urine culture  Acute cystitis without hematuria [N30.00]  Rx with ABX as below. Drink plenty of fluids and supportive care.  Follow-up: Return if symptoms worsen or fail to improve.  New Prescriptions   SULFAMETHOXAZOLE-TRIMETHOPRIM (BACTRIM DS,SEPTRA DS) 800-160 MG PER TABLET    Take 1 tablet by mouth 2 (two) times daily.   Orders Placed This Encounter  Procedures  . Urine culture    Signed,  Frederico Hamman T. Katasha Riga, MD   Patient's Medications  New Prescriptions   SULFAMETHOXAZOLE-TRIMETHOPRIM (BACTRIM DS,SEPTRA DS) 800-160 MG PER TABLET    Take 1 tablet by mouth 2 (two) times daily.  Previous Medications   ACETAMINOPHEN-CODEINE  (TYLENOL #3) 300-30 MG PER TABLET       ALBUTEROL (PROVENTIL HFA;VENTOLIN HFA) 108 (90 BASE) MCG/ACT INHALER    INHALE 2 PUFFS INTO THE LUNGS EVERY 4 (FOUR) HOURS AS NEEDED FOR WHEEZING.   ALPRAZOLAM (XANAX) 0.5 MG TABLET    TAKE 1 TABLET 3 TIMES A DAY   AMITRIPTYLINE (ELAVIL) 50 MG TABLET    Take 50 mg by mouth at bedtime.   BLOOD GLUCOSE MONITORING SUPPL (ACCU-CHEK AVIVA PLUS) W/DEVICE KIT       BUDESONIDE-FORMOTEROL (SYMBICORT) 80-4.5 MCG/ACT INHALER    INHALE 2 PUFFS INTO THE LUNGS 2 (TWO) TIMES DAILY.   CALCIUM ELEMENTAL AS CARBONATE (ANTACID ULTRA STRENGTH) 400 MG TABLET    Chew 1,000 mg by mouth as needed. heartburn   CHLORPHENIRAMINE (CHLOR-TRIMETON) 4 MG TABLET    Take 4 mg by mouth 2 (two) times daily as needed for allergies.   CYCLOBENZAPRINE (FLEXERIL) 10 MG TABLET       DIAZEPAM (VALIUM) 5 MG TABLET       GABAPENTIN (NEURONTIN) 300 MG CAPSULE    Take 300 mg by mouth 3 (three) times daily.    GEMFIBROZIL (LOPID) 600 MG TABLET    TAKE 1 TABLET BY MOUTH TWICE A DAY   GUAIFENESIN (ROBITUSSIN) 100 MG/5ML SYRUP    Take 200 mg by mouth 3 (three) times daily as needed for cough.   INSULIN GLARGINE (LANTUS SOLOSTAR) 100 UNIT/ML SOLOSTAR PEN    Inject 10 Units into the skin at bedtime.   LIRAGLUTIDE 18 MG/3ML SOPN    Inject 0.3 mLs (1.8 mg total)  into the skin daily.   METFORMIN (GLUCOPHAGE) 1000 MG TABLET    Take 1 tablet (1,000 mg total) by mouth 2 (two) times daily with a meal.   MUPIROCIN OINTMENT (BACTROBAN) 2 %       PIOGLITAZONE (ACTOS) 45 MG TABLET    TAKE 1 TABLET (45 MG TOTAL) BY MOUTH DAILY.  Modified Medications   No medications on file  Discontinued Medications   No medications on file

## 2015-02-04 ENCOUNTER — Other Ambulatory Visit: Payer: Medicare Other

## 2015-02-04 ENCOUNTER — Telehealth: Payer: Self-pay | Admitting: Family Medicine

## 2015-02-04 DIAGNOSIS — E785 Hyperlipidemia, unspecified: Secondary | ICD-10-CM

## 2015-02-04 DIAGNOSIS — E119 Type 2 diabetes mellitus without complications: Secondary | ICD-10-CM

## 2015-02-04 NOTE — Telephone Encounter (Signed)
-----   Message from Marchia Bond sent at 01/31/2015  4:27 PM EDT ----- Regarding: 3 mo f/u labs Fri 02/04/15 Please order  future 3 mo DM f/u labs for pt's upcoming lab appt. Thanks Aniceto Boss

## 2015-02-05 LAB — URINE CULTURE: Colony Count: 25000

## 2015-02-08 ENCOUNTER — Ambulatory Visit: Payer: Medicare Other | Admitting: Family Medicine

## 2015-02-08 ENCOUNTER — Other Ambulatory Visit: Payer: Self-pay | Admitting: *Deleted

## 2015-02-08 MED ORDER — CYCLOBENZAPRINE HCL 10 MG PO TABS: 10.0000 mg | ORAL_TABLET | Freq: Two times a day (BID) | ORAL | Status: DC

## 2015-02-08 NOTE — Telephone Encounter (Signed)
Last office visit 02/03/2015 with Dr. Lorelei Pont.  Ok to refill?

## 2015-02-11 ENCOUNTER — Other Ambulatory Visit (INDEPENDENT_AMBULATORY_CARE_PROVIDER_SITE_OTHER): Payer: Medicare Other

## 2015-02-11 DIAGNOSIS — E119 Type 2 diabetes mellitus without complications: Secondary | ICD-10-CM

## 2015-02-11 DIAGNOSIS — E785 Hyperlipidemia, unspecified: Secondary | ICD-10-CM

## 2015-02-11 LAB — COMPREHENSIVE METABOLIC PANEL
ALBUMIN: 3.6 g/dL (ref 3.5–5.2)
ALK PHOS: 162 U/L — AB (ref 39–117)
ALT: 17 U/L (ref 0–35)
AST: 17 U/L (ref 0–37)
BUN: 20 mg/dL (ref 6–23)
CALCIUM: 9.7 mg/dL (ref 8.4–10.5)
CHLORIDE: 101 meq/L (ref 96–112)
CO2: 26 mEq/L (ref 19–32)
Creatinine, Ser: 0.67 mg/dL (ref 0.40–1.20)
GFR: 97.21 mL/min (ref >60.00)
Glucose, Bld: 234 mg/dL — ABNORMAL HIGH (ref 70–99)
POTASSIUM: 4.4 meq/L (ref 3.5–5.1)
SODIUM: 134 meq/L — AB (ref 135–145)
TOTAL PROTEIN: 7.2 g/dL (ref 6.0–8.3)
Total Bilirubin: 0.3 mg/dL (ref 0.2–1.2)

## 2015-02-11 LAB — LIPID PANEL
Cholesterol: 166 mg/dL (ref 0–200)
HDL: 43.2 mg/dL (ref >39.00)
LDL Cholesterol: 98 mg/dL (ref 0–99)
NONHDL: 122.8
Total CHOL/HDL Ratio: 4
Triglycerides: 125 mg/dL (ref 0.0–149.0)
VLDL: 25 mg/dL (ref 0.0–40.0)

## 2015-02-11 LAB — HEMOGLOBIN A1C: Hgb A1c MFr Bld: 9.5 % — ABNORMAL HIGH (ref 4.6–6.5)

## 2015-02-15 ENCOUNTER — Encounter: Payer: Self-pay | Admitting: Family Medicine

## 2015-02-15 ENCOUNTER — Ambulatory Visit (INDEPENDENT_AMBULATORY_CARE_PROVIDER_SITE_OTHER): Payer: Medicare Other | Admitting: Family Medicine

## 2015-02-15 VITALS — BP 140/72 | HR 105 | Temp 97.8°F | Ht 59.0 in | Wt 227.5 lb

## 2015-02-15 DIAGNOSIS — E119 Type 2 diabetes mellitus without complications: Secondary | ICD-10-CM

## 2015-02-15 DIAGNOSIS — R609 Edema, unspecified: Secondary | ICD-10-CM

## 2015-02-15 LAB — HM DIABETES FOOT EXAM

## 2015-02-15 MED ORDER — INSULIN GLARGINE 100 UNIT/ML SOLOSTAR PEN: 20.0000 [IU] | PEN_INJECTOR | Freq: Every day | SUBCUTANEOUS | Status: DC

## 2015-02-15 MED ORDER — HYDROCHLOROTHIAZIDE 25 MG PO TABS: 25.0000 mg | ORAL_TABLET | Freq: Every day | ORAL | Status: DC

## 2015-02-15 NOTE — Progress Notes (Signed)
Pre visit review using our clinic review tool, if applicable. No additional management support is needed unless otherwise documented below in the visit note. 

## 2015-02-15 NOTE — Progress Notes (Addendum)
Subjective:     Patient ID: Sandra Ferrell, female   DOB: 1961/04/02, 54 y.o.   MRN: [id number]  HPI Sandra Ferrell is a 54 y/o with hx of DM, HTN, GERD, hyperlipidemia here for a diabetes follow up. Sandra Ferrell was started on insulin glargine in 3/16 to get her DM under control prior to neck surgery on 01/19/15. Has been doing relatively well since surgery, still has low back and leg pain. Goes back to surgeon on 4/26 for xrays, still wearing soft neck collar brace.  Is still using insulin glargine 10 units at night, sometimes uses 12-14 at night.   No longer taking glipizide Still taking Metformin, Actos, Victoza  Diabetes:  Using medications without difficulties: Metformin, Actos, Victoza Hypoglycemic episodes: none Hyperglycemic episodes: highest fasting glucose 365, fasting glucose often >200 Feet problems: reports swelling in legs and feet since surgery (husband thinks is because Sandra Ferrell is sleeping in a recliner with feet elevated) Blood Sugars averaging: fasting glucose  in 200s (range 120- over 200) eye exam within last year: last 3-4 years ago, due    Review of Systems  Constitutional: Positive for fatigue.  Respiratory: Negative for cough.   Cardiovascular: Positive for leg swelling.  Musculoskeletal: Positive for back pain.  Neurological: Negative for headaches.       Objective:   Physical Exam  Constitutional: Sandra Ferrell is oriented to person, place, and time. Sandra Ferrell appears well-developed and well-nourished.  HENT:  Head: Normocephalic.  Eyes: Conjunctivae are normal. Pupils are equal, round, and reactive to light.  Neck: Normal range of motion. Neck supple. No thyromegaly present.  Surgical scar on anterior neck appears to be healing well  Cardiovascular: Normal rate, regular rhythm, S1 normal and S2 normal.  Exam reveals no gallop and no friction rub.   No murmur heard. Pulmonary/Chest: Effort normal and breath sounds normal.  Neurological: Sandra Ferrell is alert and oriented to person, place, and  time.   Diabetic foot exam: Normal inspection No skin breakdown No calluses  Normal DP pulses Normal sensation to light touch and monofilament Nails normal 1+ pitting edema bilaterally  Skin: Skin is warm. No rash noted.  Psychiatric: Sandra Ferrell has a normal mood and affect. Her behavior is normal.       Assessment:     1. DM: Poorly controlled, last A1c 9.5%, fasting glucose in the 200s at home.   2. peripheral edema: Likely due to recent inactivity following surgery. No sign of DVT.    Plan:     1. DM: Plan to increase insulin glargine  from 10 units at bedtime to 20 units at bedtime. Keep checking blood glucose daily. Call if glucose readings are not improving over the next few weeks. Keep working on [employer].    2: peripheral edema: Start fluid pill daily, Increase exercise and elevate feet above heart when sitting .  Dante Gang served as Education administrator for Dr. [name] [birth year] 11:30 am   Patient seen and examined. Med student acted as Education administrator only.  HPI/ROS/PE and assessment/plan created  By MD and scribed by med student.  Eliezer Lofts MD

## 2015-02-15 NOTE — Patient Instructions (Signed)
increase insulin glargine  from 10 units at bedtime to 20 units at bedtime.  Keep checking blood sugar daily.  Call if sugar readings are not improving over the next few weeks.  Keep working on [employer]. Start fluid pill daily in the morning

## 2015-02-17 DIAGNOSIS — M545 Low back pain: Secondary | ICD-10-CM | POA: Diagnosis not present

## 2015-02-22 DIAGNOSIS — M4806 Spinal stenosis, lumbar region: Secondary | ICD-10-CM | POA: Diagnosis not present

## 2015-02-23 ENCOUNTER — Telehealth: Payer: Self-pay | Admitting: Family Medicine

## 2015-02-23 ENCOUNTER — Ambulatory Visit: Payer: Self-pay | Admitting: Primary Care

## 2015-02-23 ENCOUNTER — Other Ambulatory Visit: Payer: Self-pay | Admitting: *Deleted

## 2015-02-23 MED ORDER — ALBUTEROL SULFATE HFA 108 (90 BASE) MCG/ACT IN AERS: 2.0000 | INHALATION_SPRAY | RESPIRATORY_TRACT | Status: DC | PRN

## 2015-02-23 NOTE — Telephone Encounter (Signed)
Carrie at front desk said pt cancelled appt with Allie Bossier NP; BS had gone down and pt was feeling better. If pt had further problem would go to ED.

## 2015-02-23 NOTE — Telephone Encounter (Signed)
PLEASE NOTE: All timestamps contained within this report are represented as [place] Standard Time. CONFIDENTIALTY NOTICE: This fax transmission is intended only for the addressee. It contains information that is legally privileged, confidential or otherwise protected from use or disclosure. If you are not the intended recipient, you are strictly prohibited from reviewing, disclosing, copying using or disseminating any of this information or taking any action in reliance on or regarding this information. If you have received this fax in error, please notify us immediately by telephone so that we can arrange for its return to [place]. Phone: [phone], Toll-Free: [phone], Fax: [phone] Page: 1 of 2 Call Id: [id number] [place] Patient Name: Sandra Ferrell Gender: Female DOB: 12-27-1960 Age: 54 Y 9 M 12 D Return Phone Number: [phone] (Primary), [phone] (Secondary) Address: City/State/Zip: [place] Client [facility] - Client Client Site [place] - Day Physician [name], Amy Contact Type Call Call Type Triage / Clinical Relationship To Patient Self Appointment Disposition EMR Appointment Scheduled Info pasted into Epic No Return Phone Number [phone] (Primary) Chief Complaint Blood Sugar High Initial Comment Caller states she was put on insulin and was told to take 20 units/day. She got a lumbar shot yesterday. It is running her insulin up in the 400's. Can she take more insulin? PreDisposition Call Doctor Nurse Assessment Nurse: Martyn Ehrich, RN, Solmon Ice Date/Time Eilene Ghazi Time): 02/23/2015 3:37:46 PM Confirm and document reason for call. If symptomatic, describe symptoms. ---PT is on 20 units of insulin daily lantus at night and oral medication for blood sugar (since the shot she is adjusting it herself) and had a lumbar shot yesterday  - (steroid) and it is making her go up. Now her blood sugar is 327. (it was above 400 2x today) Has the patient traveled out of the country within the last 30 days? ---No Does the patient require triage? ---Yes Related visit to physician within the last 2 weeks? ---Yes Does the PT have any chronic conditions? (i.e. diabetes, asthma, etc.) ---Yes List chronic conditions. ---DM, asthma, fibromyalgia ruptured disk in lower back and had cervical surgery March 16th Did the patient indicate they were pregnant? ---No Guidelines Guideline Title Affirmed Question Affirmed Notes Nurse Date/Time (Eastern Time) Diabetes - High Blood Sugar [1] Blood glucose > 300 mg/dl (16.5 mmol/l) AND [2] two or more times in a row Lawrence, RN, Felicia 5/32/9924 2:68:34 PM Disp. Time Eilene Ghazi Time) Disposition Final User 02/23/2015 3:57:40 PM Call Completed Martyn Ehrich, RN, Solmon Ice PLEASE NOTE: All timestamps contained within this report are represented as [place] Standard Time. CONFIDENTIALTY NOTICE: This fax transmission is intended only for the addressee. It contains information that is legally privileged, confidential or otherwise protected from use or disclosure. If you are not the intended recipient, you are strictly prohibited from reviewing, disclosing, copying using or disseminating any of this information or taking any action in reliance on or regarding this information. If you have received this fax in error, please notify us immediately by telephone so that we can arrange for its return to [place]. Phone: [phone], Toll-Free: [phone], Fax: [phone] Page: 2 of 2 Call Id: [id number] 02/23/2015 3:42:45 PM Call PCP Now Yes Martyn Ehrich, RN, Alphia Kava Understands: Yes Disagree/Comply: Comply Care Advice Given Per Guideline CALL PCP NOW: You need to discuss this with your doctor. I'll page him now. If you haven't heard from the on-call doctor within 30 minutes, call again. * Vomiting occurs *  Rapid  breathing occurs * You become worse. After Care Instructions Given Call Event Type User Date / Time Description Referrals REFERRED TO PCP OFFICE

## 2015-02-23 NOTE — Telephone Encounter (Signed)
[facility] Patient Name: Sandra Ferrell DOB: 07-12-1961 Initial Comment Caller states she was put on insulin and was told to take 20 units/day. She got a lumbar shot yesterday. It is running her insulin up in the 400's. Can she take more insulin? Nurse Assessment Nurse: Martyn Ehrich, RN, Felicia Date/Time (Eastern Time): 02/23/2015 3:37:46 PM Confirm and document reason for call. If symptomatic, describe symptoms. ---PT is on 20 units of insulin daily lantus at night and oral medication for blood sugar (since the shot she is adjusting it herself) and had a lumbar shot yesterday - (steroid) and it is making her go up. Now her blood sugar is 327. (it was above 400 2x today) Has the patient traveled out of the country within the last 30 days? ---No Does the patient require triage? ---Yes Related visit to physician within the last 2 weeks? ---Yes Does the PT have any chronic conditions? (i.e. diabetes,---Yes List chronic conditions. ---DM, asthma, fibromyalgia ruptured disk in lower back and had cervical surgery March 16th Did the patient indicate they were pregnant? ---No Guidelines Guideline Title Affirmed Question Affirmed Notes Diabetes - High Blood Sugar [1] Blood glucose > 300 mg/dl (16.5 mmol/ l) AND [2] two or more times in a row Final Disposition User Call PCP Now Martyn Ehrich, RN, Day Call Id: [phone]

## 2015-02-24 ENCOUNTER — Telehealth: Payer: Self-pay | Admitting: *Deleted

## 2015-02-24 NOTE — Telephone Encounter (Signed)
Left detailed message on voicemail.  

## 2015-02-24 NOTE — Telephone Encounter (Signed)
Noted, forward to Dr. Jacinto Reap.

## 2015-02-24 NOTE — Telephone Encounter (Signed)
Noted  

## 2015-02-24 NOTE — Telephone Encounter (Signed)
[employer] calling asking for pt's last BP and A1C [phone] [id number]

## 2015-02-24 NOTE — Telephone Encounter (Signed)
Provide as requested.

## 2015-02-28 ENCOUNTER — Other Ambulatory Visit: Payer: Self-pay

## 2015-02-28 MED ORDER — ALPRAZOLAM 0.5 MG PO TABS: ORAL_TABLET | ORAL | Status: DC

## 2015-02-28 NOTE — Telephone Encounter (Signed)
Pt left /vm requesting refill alprazolam to [facility]. Pt last seen 02/15/15 and rx last printed 02/01/15.

## 2015-03-01 DIAGNOSIS — M4800 Spinal stenosis, site unspecified: Secondary | ICD-10-CM | POA: Diagnosis not present

## 2015-03-01 DIAGNOSIS — Z9889 Other specified postprocedural states: Secondary | ICD-10-CM | POA: Diagnosis not present

## 2015-03-01 NOTE — Telephone Encounter (Signed)
Rx called to pharmacy

## 2015-03-15 ENCOUNTER — Other Ambulatory Visit: Payer: Self-pay | Admitting: *Deleted

## 2015-03-15 MED ORDER — BUDESONIDE-FORMOTEROL FUMARATE 80-4.5 MCG/ACT IN AERO: INHALATION_SPRAY | RESPIRATORY_TRACT | Status: DC

## 2015-03-16 ENCOUNTER — Telehealth: Payer: Self-pay

## 2015-03-16 NOTE — Telephone Encounter (Signed)
Pt wanted to ck on refills of lantus since Dr [name] increased lantus to 20 units at bedtime; pt has not contacted [facility] about refills being available. Advised pt on 02/15/15 Dr [name] sent new rx with instructions to take 20 U of lantus at hs for # 3 pens with 11 refills. Pt will ck with pharmacy and if any problem will call office back.

## 2015-03-24 NOTE — Telephone Encounter (Signed)
Pt picked up lantus with instructions 10 units at hs. Spoke with Malika at [facility] and she will d/c any refills left on lantus with instructions take 10 units at hs;Malika told pt to take lantus as instructed 20 units at hs and when needs refill just cb. Pt voiced understanding.

## 2015-03-26 ENCOUNTER — Other Ambulatory Visit: Payer: Self-pay | Admitting: Family Medicine

## 2015-03-28 ENCOUNTER — Other Ambulatory Visit: Payer: Self-pay

## 2015-03-28 MED ORDER — ALPRAZOLAM 0.5 MG PO TABS: ORAL_TABLET | ORAL | Status: DC

## 2015-03-28 MED ORDER — METFORMIN HCL 1000 MG PO TABS: 1000.0000 mg | ORAL_TABLET | Freq: Two times a day (BID) | ORAL | Status: DC

## 2015-03-28 NOTE — Telephone Encounter (Signed)
Pt left v/m requesting refills metformin (done) and alprazolam to [facility] on [address]; advised pt refill metformin done and will send request for alprazolam to Dr [name]. rx last called in 02/28/15 and pt last seen 02/15/15.

## 2015-03-28 NOTE — Telephone Encounter (Signed)
Alprazolam called in to [facility] on [address].

## 2015-04-13 LAB — HM DIABETES EYE EXAM

## 2015-04-21 ENCOUNTER — Telehealth: Payer: Self-pay

## 2015-04-21 NOTE — Telephone Encounter (Signed)
Pt request refill victoza to [facility] [address]; advised pt available refills at [facility] and [facility] can call and have transferred. Pt voiced understanding and will request transfer.

## 2015-04-22 ENCOUNTER — Other Ambulatory Visit: Payer: Self-pay

## 2015-04-22 MED ORDER — LIRAGLUTIDE 18 MG/3ML ~~LOC~~ SOPN: 1.8000 mg | PEN_INJECTOR | Freq: Every day | SUBCUTANEOUS | Status: DC

## 2015-04-22 NOTE — Telephone Encounter (Signed)
Pt request refill victoza to [facility] [address];spoke with [facility] and they transferred to [place] but [facility] [address] does not have refills; pt out of med and per protocol med sent to [facility] [address]. Pt notified refill done.

## 2015-04-25 ENCOUNTER — Other Ambulatory Visit: Payer: Self-pay | Admitting: Family Medicine

## 2015-04-25 NOTE — Telephone Encounter (Signed)
Received refill request electronically from pharmacy Last refill Alprazolam 03/28/15 #90 Last refill Cyclobenzaprine 02/08/15 #60 Last office visit 02/15/15 Is it okay to refill medication?

## 2015-04-25 NOTE — Telephone Encounter (Signed)
Pt left v/m requesting status of refills for cyclobenzaprine and alprazolam.Please advise.

## 2015-04-26 NOTE — Telephone Encounter (Signed)
Rx called in as prescribed 

## 2015-04-26 NOTE — Telephone Encounter (Signed)
Pt request status of alprazolam refill; advised called to [facility] [address] 04/26/15 at 10:15 am. Pt will ck with pharmacy.

## 2015-05-03 ENCOUNTER — Other Ambulatory Visit: Payer: Self-pay

## 2015-05-03 MED ORDER — INSULIN GLARGINE 100 UNIT/ML SOLOSTAR PEN: 20.0000 [IU] | PEN_INJECTOR | Freq: Every day | SUBCUTANEOUS | Status: DC

## 2015-05-03 NOTE — Telephone Encounter (Signed)
Pt left /vm; the lantus rx that was transferred from [facility] had instructions take 10 units at hs. Pt request new rx for inject 20 units at bedtime sent to [facility] [address]; spoke with pharmacist at [place] [address] and when received transfer from [facility] instructions were 10 units hs. [facility] is d/c the 10 units hs and new rx with instructions 20 units at hs is sent electronically. Pt voiced understanding.

## 2015-05-16 ENCOUNTER — Telehealth: Payer: Self-pay | Admitting: Family Medicine

## 2015-05-16 ENCOUNTER — Other Ambulatory Visit (INDEPENDENT_AMBULATORY_CARE_PROVIDER_SITE_OTHER): Payer: Medicare Other

## 2015-05-16 DIAGNOSIS — E119 Type 2 diabetes mellitus without complications: Secondary | ICD-10-CM

## 2015-05-16 DIAGNOSIS — Z79899 Other long term (current) drug therapy: Secondary | ICD-10-CM | POA: Diagnosis not present

## 2015-05-16 LAB — MICROALBUMIN / CREATININE URINE RATIO
Creatinine,U: 141.7 mg/dL
MICROALB/CREAT RATIO: 4.6 mg/g (ref 0.0–30.0)
Microalb, Ur: 6.5 mg/dL — ABNORMAL HIGH (ref 0.0–1.9)

## 2015-05-16 LAB — HEMOGLOBIN A1C: HEMOGLOBIN A1C: 7.7 % — AB (ref 4.6–6.5)

## 2015-05-16 NOTE — Telephone Encounter (Signed)
-----   Message from Marchia Bond sent at 05/11/2015  1:45 PM EDT ----- Regarding: dm f/u labs Mon 7/11, need orders please :-) Please order  future dm f/u labs for pt's upcoming lab appt. Thanks Aniceto Boss

## 2015-05-19 ENCOUNTER — Encounter: Payer: Self-pay | Admitting: Family Medicine

## 2015-05-19 ENCOUNTER — Ambulatory Visit (INDEPENDENT_AMBULATORY_CARE_PROVIDER_SITE_OTHER): Payer: Medicare Other | Admitting: Family Medicine

## 2015-05-19 VITALS — BP 118/70 | HR 94 | Temp 98.3°F | Ht 59.0 in | Wt 229.5 lb

## 2015-05-19 DIAGNOSIS — R809 Proteinuria, unspecified: Secondary | ICD-10-CM | POA: Diagnosis not present

## 2015-05-19 DIAGNOSIS — I10 Essential (primary) hypertension: Secondary | ICD-10-CM

## 2015-05-19 DIAGNOSIS — E119 Type 2 diabetes mellitus without complications: Secondary | ICD-10-CM

## 2015-05-19 LAB — HM DIABETES FOOT EXAM

## 2015-05-19 NOTE — Assessment & Plan Note (Signed)
Improving control on current regimen . Add back exercise and decrease fruit. Continue insulin at 20 units daily.

## 2015-05-19 NOTE — Patient Instructions (Addendum)
Get back to healthy eating. Decrease fruit given it increases sugar.  Increase water.

## 2015-05-19 NOTE — Assessment & Plan Note (Signed)
Well controlled. Continue current medication.  

## 2015-05-19 NOTE — Progress Notes (Signed)
Pre visit review using our clinic review tool, if applicable. No additional management support is needed unless otherwise documented below in the visit note. 

## 2015-05-19 NOTE — Progress Notes (Signed)
Subjective:    Patient ID: Sandra Ferrell, female    DOB: February 07, 1961, 54 y.o.   MRN: [id number]  HPI  Ms. Hepp is a 54 y/o with hx of DM, HTN, GERD, hyperlipidemia here for a diabetes follow up. She was started on insulin glargine in 3/16 to get her DM under control prior to neck surgery on 01/19/15. Has been doing relatively well since surgery, still has low back and leg pain.   Is still using insulin glargine , now at 20 units at night.  No longer taking glipizide Still taking Metformin, Actos, Victoza  Diabetes:  Much improved control.  Lab Results  Component Value Date   HGBA1C 7.7* 05/16/2015  Using medications without difficulties: insulin,Metformin, Actos, Victoza Hypoglycemic episodes: none Hyperglycemic episodes: highest fasting glucose 204, fasting glucose often >200 Feet problems: reports swelling in legs and feet since surgery (husband thinks is because she is sleeping in a recliner with feet elevated) Blood Sugars averaging: fasting glucose 204 this as AM (but ate cherries last night)  Usually fasting 85-130, 2 hour post prandial 155 eye exam within last year: Has appt next month.  Wt Readings from Last 3 Encounters:  05/19/15 229 lb 8 oz (104.101 kg)  02/15/15 227 lb 8 oz (103.193 kg)  02/03/15 211 lb 8 oz (95.936 kg)  Exercise: planning to restart treadmill with silver sneakers.  Diet; eating too much fruit, but less    BP Readings from Last 3 Encounters:  05/19/15 118/70  02/15/15 140/72  02/03/15 120/76    Review of Systems  Constitutional: Negative for fever and fatigue.  HENT: Negative for ear pain.   Eyes: Negative for pain.  Respiratory: Negative for chest tightness and shortness of breath.   Cardiovascular: Negative for chest pain, palpitations and leg swelling.  Gastrointestinal: Negative for abdominal pain.  Genitourinary: Negative for dysuria.        Objective:   Physical Exam  Constitutional: Vital signs are normal. She appears  well-developed and well-nourished. She is cooperative.  Non-toxic appearance. She does not appear ill. No distress.  obese  HENT:  Head: Normocephalic.  Right Ear: Hearing, tympanic membrane, external ear and ear canal normal. Tympanic membrane is not erythematous, not retracted and not bulging.  Left Ear: Hearing, tympanic membrane, external ear and ear canal normal. Tympanic membrane is not erythematous, not retracted and not bulging.  Nose: No mucosal edema or rhinorrhea. Right sinus exhibits no maxillary sinus tenderness and no frontal sinus tenderness. Left sinus exhibits no maxillary sinus tenderness and no frontal sinus tenderness.  Mouth/Throat: Uvula is midline, oropharynx is clear and moist and mucous membranes are normal.  Eyes: Conjunctivae, EOM and lids are normal. Pupils are equal, round, and reactive to light. Lids are everted and swept, no foreign bodies found.  Neck: Trachea normal and normal range of motion. Neck supple. Carotid bruit is not present. No thyroid mass and no thyromegaly present.  Cardiovascular: Normal rate, regular rhythm, S1 normal, S2 normal, normal heart sounds, intact distal pulses and normal pulses.  Exam reveals no gallop and no friction rub.   No murmur heard. Pulmonary/Chest: Effort normal and breath sounds normal. No tachypnea. No respiratory distress. She has no decreased breath sounds. She has no wheezes. She has no rhonchi. She has no rales.  Abdominal: Soft. Normal appearance and bowel sounds are normal. There is no tenderness.  Neurological: She is alert.  Skin: Skin is warm, dry and intact. No rash noted.  Psychiatric: Her speech is normal  and behavior is normal. Judgment and thought content normal. Her mood appears not anxious. Cognition and memory are normal. She does not exhibit a depressed mood.   Diabetic foot exam: Normal inspection No skin breakdown No calluses  Normal DP pulses Normal sensation to light touch and monofilament Nails  normal        Assessment & Plan:

## 2015-05-19 NOTE — Assessment & Plan Note (Signed)
Pt hesitant about adding ACEI for renal protection. Will work on decreasing glucose further, recheck in 1 year.

## 2015-05-23 ENCOUNTER — Other Ambulatory Visit: Payer: Self-pay | Admitting: Family Medicine

## 2015-05-23 NOTE — Telephone Encounter (Signed)
Last office visit 05/19/2015.  Last refilled 04/25/2015 for #90 with no refills.  Ok to refill?

## 2015-05-23 NOTE — Telephone Encounter (Signed)
Pt left v/m requesting status of alprazolam refill. 

## 2015-05-24 NOTE — Telephone Encounter (Signed)
Called into [facility] [place], [place].

## 2015-05-26 ENCOUNTER — Telehealth: Payer: Self-pay

## 2015-05-26 NOTE — Telephone Encounter (Signed)
Called patient to notify her of being due for a Mammogram. Patient stated that she would like to schedule her appointment with [facility]. I gave the patient their contact information, and patient stated that she would call and have that scheduled.

## 2015-05-27 ENCOUNTER — Encounter: Payer: Self-pay | Admitting: Family Medicine

## 2015-05-27 ENCOUNTER — Other Ambulatory Visit: Payer: Self-pay

## 2015-05-27 DIAGNOSIS — Z1231 Encounter for screening mammogram for malignant neoplasm of breast: Secondary | ICD-10-CM

## 2015-05-28 ENCOUNTER — Other Ambulatory Visit: Payer: Self-pay | Admitting: Family Medicine

## 2015-05-28 NOTE — Telephone Encounter (Signed)
Last office 05/19/2015.  Last refilled 04/25/2015 for #60 with no refills.  Ok to refill?

## 2015-05-30 DIAGNOSIS — H5203 Hypermetropia, bilateral: Secondary | ICD-10-CM | POA: Diagnosis not present

## 2015-05-30 DIAGNOSIS — H524 Presbyopia: Secondary | ICD-10-CM | POA: Diagnosis not present

## 2015-05-30 DIAGNOSIS — Z794 Long term (current) use of insulin: Secondary | ICD-10-CM | POA: Diagnosis not present

## 2015-05-30 DIAGNOSIS — E119 Type 2 diabetes mellitus without complications: Secondary | ICD-10-CM | POA: Diagnosis not present

## 2015-05-30 DIAGNOSIS — H52223 Regular astigmatism, bilateral: Secondary | ICD-10-CM | POA: Diagnosis not present

## 2015-05-30 LAB — HM DIABETES EYE EXAM

## 2015-05-31 ENCOUNTER — Telehealth: Payer: Self-pay | Admitting: *Deleted

## 2015-05-31 NOTE — Telephone Encounter (Signed)
Received fax from [facility] asking for a prior authorization on cyclobenzaprine 10 mg.  Insurance will not cover muscle relaxant.  In the past Mrs. Brune has been paying for this medication out of pocket.  Called and spoke with [facility].  She wishes to continue paying for this medication out of pocket since insurance won't cover.  Pharmacy notified.

## 2015-06-09 ENCOUNTER — Telehealth: Payer: Self-pay

## 2015-06-09 NOTE — Telephone Encounter (Signed)
Leah nurse case mgr with [facility] request most recent BP and A1C. Advised 05/16/15 A1C was 7.7 and 05/19/15 BP was 118/70.

## 2015-06-20 ENCOUNTER — Encounter (HOSPITAL_COMMUNITY): Payer: Self-pay | Admitting: *Deleted

## 2015-06-20 ENCOUNTER — Telehealth: Payer: Self-pay

## 2015-06-20 ENCOUNTER — Emergency Department (HOSPITAL_COMMUNITY)
Admission: EM | Admit: 2015-06-20 | Discharge: 2015-06-20 | Disposition: A | Discharge status: Home / Self Care | Payer: Medicare Other | Attending: Emergency Medicine | Admitting: Emergency Medicine

## 2015-06-20 DIAGNOSIS — M797 Fibromyalgia: Secondary | ICD-10-CM | POA: Insufficient documentation

## 2015-06-20 DIAGNOSIS — Z720 Tobacco use: Secondary | ICD-10-CM | POA: Diagnosis not present

## 2015-06-20 DIAGNOSIS — Z79899 Other long term (current) drug therapy: Secondary | ICD-10-CM | POA: Diagnosis not present

## 2015-06-20 DIAGNOSIS — Z8701 Personal history of pneumonia (recurrent): Secondary | ICD-10-CM | POA: Insufficient documentation

## 2015-06-20 DIAGNOSIS — Z880 Allergy status to penicillin: Secondary | ICD-10-CM | POA: Insufficient documentation

## 2015-06-20 DIAGNOSIS — E119 Type 2 diabetes mellitus without complications: Secondary | ICD-10-CM | POA: Insufficient documentation

## 2015-06-20 DIAGNOSIS — K088 Other specified disorders of teeth and supporting structures: Secondary | ICD-10-CM | POA: Insufficient documentation

## 2015-06-20 DIAGNOSIS — F329 Major depressive disorder, single episode, unspecified: Secondary | ICD-10-CM | POA: Insufficient documentation

## 2015-06-20 DIAGNOSIS — R011 Cardiac murmur, unspecified: Secondary | ICD-10-CM | POA: Diagnosis not present

## 2015-06-20 DIAGNOSIS — F419 Anxiety disorder, unspecified: Secondary | ICD-10-CM | POA: Diagnosis not present

## 2015-06-20 DIAGNOSIS — I10 Essential (primary) hypertension: Secondary | ICD-10-CM | POA: Diagnosis not present

## 2015-06-20 DIAGNOSIS — Z794 Long term (current) use of insulin: Secondary | ICD-10-CM | POA: Diagnosis not present

## 2015-06-20 DIAGNOSIS — J45901 Unspecified asthma with (acute) exacerbation: Secondary | ICD-10-CM | POA: Diagnosis not present

## 2015-06-20 DIAGNOSIS — K0889 Other specified disorders of teeth and supporting structures: Secondary | ICD-10-CM

## 2015-06-20 MED ORDER — ACETAMINOPHEN 325 MG PO TABS: 650.0000 mg | ORAL_TABLET | Freq: Once | ORAL | Status: DC

## 2015-06-20 MED ORDER — CLINDAMYCIN HCL 300 MG PO CAPS: 300.0000 mg | ORAL_CAPSULE | Freq: Four times a day (QID) | ORAL | Status: DC

## 2015-06-20 MED ORDER — CLINDAMYCIN HCL 150 MG PO CAPS: 300.0000 mg | ORAL_CAPSULE | Freq: Once | ORAL | Status: DC

## 2015-06-20 NOTE — Telephone Encounter (Signed)
Pt request refill lantus; advised pt should have available refills and pt will ck with pharmacy and if problem pt will have pharmacy contact [facility].

## 2015-06-20 NOTE — ED Notes (Signed)
Patient refused medication stating " Since he (MD) refused to give me something for pain other than tylenol " patient refused to allow nurse to get discharge vitals. Patient also refused to sign discharge instructions.

## 2015-06-20 NOTE — Discharge Instructions (Signed)

## 2015-06-20 NOTE — ED Provider Notes (Signed)
CSN: [id number]     Arrival date & time 06/20/15  1528 History  This chart was scribed for Ripley Fraise, MD by Ludger Nutting, ED Scribe. This patient was seen in room TR09C/TR09C and the patient's care was started 4:14 PM.    Chief Complaint  Patient presents with  . Dental Pain    The history is provided by the patient. No language interpreter was used.     HPI Comments: Sandra Ferrell is a 54 y.o. female who presents to the Emergency Department complaining of constant, gradually worsened upper front dental pain that has been ongoing for the past 3 days. She has taken ibuprofen earlier today without relief. She has follow up with [facility] on 8[facility]. She denies taking antibiotics for these symptoms in the past. She denies objective fever, vomiting.   Past Medical History  Diagnosis Date  . Allergic rhinitis, cause unspecified   . Anxiety state, unspecified   . Extrinsic asthma with exacerbation   . Backache, unspecified   . Depressive disorder, not elsewhere classified   . Type II or unspecified type diabetes mellitus without mention of complication, not stated as uncontrolled   . Esophageal reflux   . Other and unspecified hyperlipidemia   . Osteoarthrosis, unspecified whether generalized or localized, unspecified site   . Other screening mammogram   . Routine gynecological examination   . Unspecified sleep apnea   . Tobacco use disorder   . Personal history of unspecified urinary disorder   . Routine general medical examination at a health care facility   . Heart murmur   . Hypertension   . Calculus of kidney   . Fibromyalgia   . Wears glasses   . Carpal tunnel syndrome, right   . Pneumonia    Past Surgical History  Procedure Laterality Date  . Appendectomy  1973  . Cholecystectomy  08/2006  . Transthoracic echocardiogram  02/2011    mild LVH, nl EF, mild diastolic dysfunction, no wall motion abnl  . Colonoscopy    . Anterior cervical decomp/discectomy fusion N/A  01/19/2015    Procedure: ANTERIOR CERVICAL DECOMPRESSION/DISCECTOMY FUSION 2 LEVELS;  Surgeon: Phylliss Bob, MD;  Location: [facility];  Service: Orthopedics;  Laterality: N/A;  Anterior cervical decompression fusion, cervical 5-6, cervical 6-7 with instrumentation and allograft   Family History  Problem Relation Age of Onset  . Stroke Father   . Colon cancer Cousin    Social History  Substance Use Topics  . Smoking status: Current Every Day Smoker -- 1.00 packs/day for 35 years    Types: Cigarettes  . Smokeless tobacco: Never Used  . Alcohol Use: No   OB History    No data available     Review of Systems  Constitutional: Negative for fever.  HENT: Positive for dental problem.   Gastrointestinal: Negative for vomiting.    Allergies  Benazepril; Aspirin; Diclofenac sodium; Fluticasone-salmeterol; Nsaids; and Penicillins  Home Medications   Prior to Admission medications   Medication Sig Start Date End Date Taking? Authorizing Provider  ACCU-CHEK AVIVA PLUS test strip  04/02/15   Historical Provider, MD  acetaminophen-codeine (TYLENOL #3) 300-30 MG per tablet  02/02/15   Historical Provider, MD  AIMSCO INSULIN SYR ULTRA THIN 31G X 5/16" 0.3 ML MISC  04/02/15   Historical Provider, MD  albuterol (PROAIR HFA) 108 (90 BASE) MCG/ACT inhaler Inhale 2 puffs into the lungs every 4 (four) hours as needed for wheezing or shortness of breath. 02/23/15   Amy Cletis Athens,  MD  ALPRAZolam (XANAX) 0.5 MG tablet TAKE 1 TABLET BY MOUTH THREE TIMES DAILY 05/23/15   Amy Cletis Athens, MD  amitriptyline (ELAVIL) 50 MG tablet Take 50 mg by mouth at bedtime. 11/04/13   Amy Cletis Athens, MD  B-D ULTRAFINE III SHORT PEN 31G X 8 MM MISC USE AS DIRECTED WITH INSULIN PEN 03/26/15   Amy Cletis Athens, MD  Blood Glucose Monitoring Suppl (ACCU-CHEK AVIVA PLUS) W/DEVICE KIT  08/13/14   Historical Provider, MD  budesonide-formoterol (SYMBICORT) 80-4.5 MCG/ACT inhaler INHALE 2 PUFFS INTO THE LUNGS 2 (TWO) TIMES DAILY. 03/15/15   Amy Cletis Athens, MD  calcium elemental as carbonate (ANTACID ULTRA STRENGTH) 400 MG tablet Chew 1,000 mg by mouth as needed. heartburn    Historical Provider, MD  chlorpheniramine (CHLOR-TRIMETON) 4 MG tablet Take 4 mg by mouth 2 (two) times daily as needed for allergies.    Historical Provider, MD  cyclobenzaprine (FLEXERIL) 10 MG tablet TAKE 1 TABLET BY MOUTH TWICE DAILY 05/31/15   Amy Cletis Athens, MD  gabapentin (NEURONTIN) 300 MG capsule Take 300 mg by mouth 3 (three) times daily.     Historical Provider, MD  gemfibrozil (LOPID) 600 MG tablet TAKE 1 TABLET BY MOUTH TWICE A DAY Patient taking differently: Take 600 mg by mouth 2 (two) times daily before a meal. TAKE 1 TABLET BY MOUTH TWICE A DAY 12/01/14   Amy E [name], MD  hydrochlorothiazide (HYDRODIURIL) 25 MG tablet Take 1 tablet (25 mg total) by mouth daily. 02/15/15   Amy Cletis Athens, MD  Insulin Glargine (LANTUS SOLOSTAR) 100 UNIT/ML Solostar Pen Inject 20 Units into the skin at bedtime. Patient taking differently: Inject 20 Units into the skin at bedtime. Can use 30 units on days CBGs are high. 05/03/15   Jinny Sanders, MD  Lancet Devices (ADJUSTABLE LANCING DEVICE) MISC  02/19/15   Historical Provider, MD  Liraglutide 18 MG/3ML SOPN Inject 0.3 mLs (1.8 mg total) into the skin daily. 04/22/15   Amy Cletis Athens, MD  metFORMIN (GLUCOPHAGE) 1000 MG tablet Take 1 tablet (1,000 mg total) by mouth 2 (two) times daily with a meal. 03/28/15   Amy E [name], MD  methocarbamol (ROBAXIN) 500 MG tablet TK 1 T PO BID PRF SPASMS 04/26/15   Historical Provider, MD  pioglitazone (ACTOS) 45 MG tablet TAKE 1 TABLET (45 MG TOTAL) BY MOUTH DAILY. Patient taking differently: Take 45 mg by mouth daily. TAKE 1 TABLET (45 MG TOTAL) BY MOUTH DAILY. 12/22/14   Amy E Bedsole, MD   BP 139/74 mmHg  Pulse 104  Temp(Src) 98.3 F (36.8 C) (Oral)  Resp 18  SpO2 93% Physical Exam  Nursing note and vitals reviewed.  CONSTITUTIONAL: Well developed/well nourished HEAD AND FACE:  Normocephalic/atraumatic EYES: EOMI/PERRL ENMT: Mucous membranes moist.  Poor dentition.  No trismus.  No focal abscess noted. NECK: supple no meningeal signs CV: S1/S2 noted, no murmurs/rubs/gallops noted LUNGS: Lungs are clear to auscultation bilaterally, no apparent distress ABDOMEN: soft NEURO: Pt is awake/alert, moves all extremitiesx4 EXTREMITIES:full ROM SKIN: warm, color normal   ED Course  Procedures   DIAGNOSTIC STUDIES: Oxygen Saturation is 93% on RA, adequate by my interpretation.    COORDINATION OF CARE: 4:17 PM Will give Tylenol and discharge home with antibiotics. Discussed treatment plan with pt at bedside and pt agreed to plan.   Advised mainstay of treatment is ABX and close dental f/u No focal abscess noted  MDM   Final diagnoses:  Pain, dental  Nursing notes including past medical history and social history reviewed and considered in documentation   I personally performed the services described in this documentation, which was scribed in my presence. The recorded information has been reviewed and is accurate.      Ripley Fraise, MD 06/20/15 [phone]

## 2015-06-20 NOTE — ED Notes (Signed)
Pt reports gum pain to upper front, also has broken tooth. No acute distress or swelling noted.

## 2015-06-21 ENCOUNTER — Other Ambulatory Visit: Payer: Self-pay | Admitting: *Deleted

## 2015-06-21 ENCOUNTER — Telehealth: Payer: Self-pay | Admitting: Family Medicine

## 2015-06-21 MED ORDER — ALPRAZOLAM 0.5 MG PO TABS: 0.5000 mg | ORAL_TABLET | Freq: Three times a day (TID) | ORAL | Status: DC

## 2015-06-21 MED ORDER — INSULIN GLARGINE 100 UNIT/ML SOLOSTAR PEN: 30.0000 [IU] | PEN_INJECTOR | Freq: Every day | SUBCUTANEOUS | Status: DC

## 2015-06-21 NOTE — Telephone Encounter (Signed)
Patient called back about her [facility] prescription.  She said she spoke to [facility] earlier and Butch Penny told her to call back if she had any problems. Please call patient.

## 2015-06-21 NOTE — Addendum Note (Signed)
Addended by: Carter Kitten on: 06/21/2015 03:47 PM   Modules accepted: Orders

## 2015-06-21 NOTE — Telephone Encounter (Signed)
Called into [facility] on [address].

## 2015-06-21 NOTE — Telephone Encounter (Signed)
Patient states she is currently using 30 units of Lantus daily instead of 20 units.  Needs new Rx sent to [facility] with new instructions.  Rx sent as requested.

## 2015-06-21 NOTE — Telephone Encounter (Signed)
Last office visit 05/19/2015.  Last refilled 05/23/2015 for #90 with no refills.  Ok to refill?

## 2015-06-22 ENCOUNTER — Other Ambulatory Visit: Payer: Self-pay

## 2015-06-22 NOTE — Telephone Encounter (Signed)
Pt left v/m requesting refill on gabapentin and methocarbamol (pt has been getting from [facility] for leg muscle spasms; pt said [facility] advised pt to get from PCP). Pt request cyclobenzaprine to be stopped and removed from pts med list. Pt request cb. [facility] [address].

## 2015-06-23 ENCOUNTER — Other Ambulatory Visit: Payer: Self-pay

## 2015-06-23 MED ORDER — GABAPENTIN 300 MG PO CAPS: 300.0000 mg | ORAL_CAPSULE | Freq: Three times a day (TID) | ORAL | Status: DC

## 2015-06-23 MED ORDER — METHOCARBAMOL 500 MG PO TABS: ORAL_TABLET | ORAL | Status: DC

## 2015-06-23 NOTE — Telephone Encounter (Signed)
Pt left v/m; pt said [facility] advised pt to have PCP prescribe Tylenol # 3 for back pain. Pt said will need rx on 06/28/15. Spoke with Gerald Stabs at [facility] last prescribed Tylenol # 3 # 90 taking one tab po q8h prn on 05/28/15. Dr Rip Harbour DDS prescribed Tylenol # 3 # 18 on 06/22/15 with instructions take 1 tab q6h prn. Pt last seen 05/19/15. Pt request cb.

## 2015-06-24 NOTE — Telephone Encounter (Signed)
Sandra Ferrell notified as instructed by telephone.  She will call me back next week to request refill.  She states she should have enough to get her through next Tuesday.

## 2015-06-24 NOTE — Telephone Encounter (Signed)
Appears she is not due yet given second prescription from Dr. Rip Harbour. Have her call back when that is  Almost gone in 1 week.

## 2015-06-24 NOTE — Telephone Encounter (Signed)
Pt left v/m requesting cb about status of Tylenol # 3 rx.

## 2015-06-27 ENCOUNTER — Other Ambulatory Visit: Payer: Self-pay

## 2015-06-27 MED ORDER — ACETAMINOPHEN-CODEINE #3 300-30 MG PO TABS: 1.0000 | ORAL_TABLET | Freq: Three times a day (TID) | ORAL | Status: DC | PRN

## 2015-06-27 NOTE — Telephone Encounter (Addendum)
Called into [facility] on [address].  Brad notified prescription refill has been called into her pharmacy.

## 2015-06-27 NOTE — Telephone Encounter (Signed)
Pt left v/m requesting refill tylenol # 3 quantity # 90. Pt request cb when refilled. See phone note 06/23/15. Did not update instructions on this request since phonenote contains 2 different sets of instructions for [facility] [address].

## 2015-06-30 ENCOUNTER — Ambulatory Visit: Payer: Medicare Other

## 2015-07-10 ENCOUNTER — Other Ambulatory Visit: Payer: Self-pay | Admitting: Family Medicine

## 2015-07-10 NOTE — Telephone Encounter (Signed)
Last office visit 05/19/2015.  Last refilled 05/31/2015 for #60 with no refills.  Ok to refill?

## 2015-07-12 NOTE — Telephone Encounter (Signed)
Pt request cb with cyclobenzaprine refill status; spoke with [facility] [address] and rx ready for pick up. Jeneen Rinks (DPR signed advised and he voiced understanding.)

## 2015-07-18 ENCOUNTER — Other Ambulatory Visit: Payer: Self-pay | Admitting: Family Medicine

## 2015-07-19 ENCOUNTER — Other Ambulatory Visit: Payer: Self-pay | Admitting: Family Medicine

## 2015-07-19 NOTE — Telephone Encounter (Signed)
Last office visit 05/19/2015.  Last refilled 06/21/2015 for #90 with no refill.  Ok to refill?

## 2015-07-19 NOTE — Telephone Encounter (Signed)
Last office visit 05/19/2015.  Last refilled 06/23/2015 for #60 with no refills.  Ok to refill?

## 2015-07-19 NOTE — Telephone Encounter (Signed)
Alprazolam called into [facility].

## 2015-07-24 ENCOUNTER — Other Ambulatory Visit: Payer: Self-pay | Admitting: Family Medicine

## 2015-07-24 NOTE — Telephone Encounter (Signed)
Last office visit 05/19/2015.  Last refilled 06/27/2015 for #90 with no refills. Ok to refill?

## 2015-07-25 NOTE — Telephone Encounter (Signed)
Tylenol #3 called into [facility] on [address].

## 2015-08-09 ENCOUNTER — Other Ambulatory Visit: Payer: Self-pay | Admitting: Family Medicine

## 2015-08-09 ENCOUNTER — Ambulatory Visit
Admission: RE | Admit: 2015-08-09 | Discharge: 2015-08-09 | Disposition: A | Discharge status: Home / Self Care | Payer: Medicare Other | Source: Ambulatory Visit

## 2015-08-09 DIAGNOSIS — Z1231 Encounter for screening mammogram for malignant neoplasm of breast: Secondary | ICD-10-CM

## 2015-08-09 NOTE — Telephone Encounter (Signed)
Last office visit 05/19/2015.  Last refilled 07/11/2015 for #60 with no refills.  Ok to refill?

## 2015-08-09 NOTE — Telephone Encounter (Signed)
Pt left v/m requesting status of cyclobenzaprine refill; spoke with pts husband(DPR signed and notified refill already done and pt can ck with pharmacy. Mr Lebron voiced understanding.

## 2015-08-15 ENCOUNTER — Other Ambulatory Visit: Payer: Self-pay | Admitting: Family Medicine

## 2015-08-15 NOTE — Telephone Encounter (Signed)
Last office visit 05/19/2015.  Both last refilled 07/19/2015.  Ok to refill?

## 2015-08-16 ENCOUNTER — Other Ambulatory Visit: Payer: Self-pay | Admitting: Family Medicine

## 2015-08-16 NOTE — Telephone Encounter (Signed)
Alprazolam called in to [facility] on [address]. 

## 2015-08-20 ENCOUNTER — Other Ambulatory Visit: Payer: Self-pay | Admitting: Family Medicine

## 2015-08-23 ENCOUNTER — Other Ambulatory Visit: Payer: Self-pay | Admitting: Family Medicine

## 2015-08-23 NOTE — Telephone Encounter (Signed)
Last office visit 05/19/2015.  Last refilled 07/24/2015 for #90 with no refills.  Ok to refill?

## 2015-08-23 NOTE — Telephone Encounter (Signed)
Tylenol #3 called into [facility] on [address].

## 2015-09-06 ENCOUNTER — Other Ambulatory Visit: Payer: Medicare Other

## 2015-09-06 ENCOUNTER — Telehealth: Payer: Self-pay | Admitting: Family Medicine

## 2015-09-06 DIAGNOSIS — E119 Type 2 diabetes mellitus without complications: Secondary | ICD-10-CM

## 2015-09-06 NOTE — Telephone Encounter (Signed)
-----   Message from Ellamae Sia sent at 08/30/2015  2:38 PM EDT ----- Regarding: Lab orders for Tuesday, 11.1.16 Patient is scheduled for CPX labs, please order future labs, Thanks , Karna Christmas

## 2015-09-07 ENCOUNTER — Other Ambulatory Visit: Payer: Medicare Other

## 2015-09-10 ENCOUNTER — Other Ambulatory Visit: Payer: Self-pay | Admitting: Family Medicine

## 2015-09-12 ENCOUNTER — Other Ambulatory Visit (INDEPENDENT_AMBULATORY_CARE_PROVIDER_SITE_OTHER): Payer: Medicare Other

## 2015-09-12 DIAGNOSIS — E119 Type 2 diabetes mellitus without complications: Secondary | ICD-10-CM

## 2015-09-12 LAB — HEMOGLOBIN A1C: Hgb A1c MFr Bld: 6.7 % — ABNORMAL HIGH (ref 4.6–6.5)

## 2015-09-12 LAB — COMPREHENSIVE METABOLIC PANEL
ALT: 21 U/L (ref 0–35)
AST: 25 U/L (ref 0–37)
Albumin: 3.7 g/dL (ref 3.5–5.2)
Alkaline Phosphatase: 107 U/L (ref 39–117)
BUN: 15 mg/dL (ref 6–23)
CALCIUM: 11 mg/dL — AB (ref 8.4–10.5)
CO2: 31 meq/L (ref 19–32)
Chloride: 101 mEq/L (ref 96–112)
Creatinine, Ser: 0.71 mg/dL (ref 0.40–1.20)
GFR: 90.73 mL/min (ref >60.00)
GLUCOSE: 155 mg/dL — AB (ref 70–99)
POTASSIUM: 4.4 meq/L (ref 3.5–5.1)
Sodium: 140 mEq/L (ref 135–145)
Total Bilirubin: 0.4 mg/dL (ref 0.2–1.2)
Total Protein: 7.2 g/dL (ref 6.0–8.3)

## 2015-09-12 LAB — LIPID PANEL
CHOL/HDL RATIO: 4
Cholesterol: 165 mg/dL (ref 0–200)
HDL: 39.3 mg/dL (ref >39.00)
LDL CALC: 104 mg/dL — AB (ref 0–99)
NONHDL: 126.16
Triglycerides: 113 mg/dL (ref 0.0–149.0)
VLDL: 22.6 mg/dL (ref 0.0–40.0)

## 2015-09-12 LAB — MICROALBUMIN / CREATININE URINE RATIO
CREATININE, U: 115.5 mg/dL
Microalb Creat Ratio: 2.5 mg/g (ref 0.0–30.0)
Microalb, Ur: 2.9 mg/dL — ABNORMAL HIGH (ref 0.0–1.9)

## 2015-09-12 NOTE — Addendum Note (Signed)
Addended by: Ellamae Sia on: 09/12/2015 09:16 AM   Modules accepted: Orders

## 2015-09-12 NOTE — Telephone Encounter (Signed)
Last office visit 05/19/2015.  All medications last refilled early October.  Ok to refill?

## 2015-09-12 NOTE — Telephone Encounter (Signed)
Pt left v/m requesting status of refills for xanax,methocarbamol and cyclobenzaprine. Pt has appt on 09/13/15.

## 2015-09-13 ENCOUNTER — Telehealth: Payer: Self-pay

## 2015-09-13 ENCOUNTER — Ambulatory Visit (INDEPENDENT_AMBULATORY_CARE_PROVIDER_SITE_OTHER): Payer: Medicare Other | Admitting: Family Medicine

## 2015-09-13 ENCOUNTER — Encounter: Payer: Self-pay | Admitting: Family Medicine

## 2015-09-13 ENCOUNTER — Telehealth: Payer: Self-pay | Admitting: *Deleted

## 2015-09-13 VITALS — BP 140/70 | HR 96 | Temp 98.7°F | Ht 58.25 in | Wt 226.2 lb

## 2015-09-13 DIAGNOSIS — Z0000 Encounter for general adult medical examination without abnormal findings: Secondary | ICD-10-CM | POA: Diagnosis not present

## 2015-09-13 DIAGNOSIS — E785 Hyperlipidemia, unspecified: Secondary | ICD-10-CM

## 2015-09-13 DIAGNOSIS — F3341 Major depressive disorder, recurrent, in partial remission: Secondary | ICD-10-CM

## 2015-09-13 DIAGNOSIS — Z23 Encounter for immunization: Secondary | ICD-10-CM

## 2015-09-13 DIAGNOSIS — Z1159 Encounter for screening for other viral diseases: Secondary | ICD-10-CM

## 2015-09-13 DIAGNOSIS — I10 Essential (primary) hypertension: Secondary | ICD-10-CM

## 2015-09-13 DIAGNOSIS — E119 Type 2 diabetes mellitus without complications: Secondary | ICD-10-CM

## 2015-09-13 DIAGNOSIS — Z7189 Other specified counseling: Secondary | ICD-10-CM

## 2015-09-13 LAB — HM DIABETES FOOT EXAM

## 2015-09-13 MED ORDER — AMITRIPTYLINE HCL 50 MG PO TABS: 50.0000 mg | ORAL_TABLET | Freq: Every day | ORAL | Status: DC

## 2015-09-13 MED ORDER — METHOCARBAMOL 500 MG PO TABS: ORAL_TABLET | ORAL | Status: DC

## 2015-09-13 NOTE — Assessment & Plan Note (Signed)
Well controlled. Continue current medication. Encouraged exercise, weight loss, healthy eating habits.  

## 2015-09-13 NOTE — Assessment & Plan Note (Signed)
Well controlled. Continue current medication.  

## 2015-09-13 NOTE — Addendum Note (Signed)
Addended by: Carter Kitten on: 09/13/2015 11:01 AM   Modules accepted: Orders

## 2015-09-13 NOTE — Progress Notes (Signed)
Pre visit review using our clinic review tool, if applicable. No additional management support is needed unless otherwise documented below in the visit note. 

## 2015-09-13 NOTE — Telephone Encounter (Signed)
Okay to refill? 

## 2015-09-13 NOTE — Telephone Encounter (Signed)
Notify pt she should not be on 2 muscle relaxants. Have her stop robaxin. Other med refilled.

## 2015-09-13 NOTE — Patient Instructions (Signed)
Decrease calcium intake some... Stop supplemented  OJ.

## 2015-09-13 NOTE — Telephone Encounter (Addendum)
Alprazolam called into [facility] on [address].  Patient has CPE today at 10:45.  Please discuss medication issue with her at that time.  Thanks

## 2015-09-13 NOTE — Assessment & Plan Note (Signed)
Stable control on current medication, elavil and alprazolam TID, trying to limit use..   She has been sad about losing her cat, had been sick , had to put him down.

## 2015-09-13 NOTE — Telephone Encounter (Signed)
Noted  

## 2015-09-13 NOTE — Telephone Encounter (Signed)
Spoke with patient at her appointment.  She would like Dr. [name] to stop the cyclobenzaprine and keep her on methocarbamol.  Called [facility] and cancelled cyclobenzaprine refill and called in refill for methocarbamol per Dr. [name].

## 2015-09-13 NOTE — Addendum Note (Signed)
Addended byEliezer Lofts E on: 09/13/2015 10:58 AM   Modules accepted: SmartSet

## 2015-09-13 NOTE — Progress Notes (Addendum)
The patient is here for annual wellness exam and preventative care.  I have personally reviewed the Medicare Annual Wellness questionnaire and have noted 1. The patient's medical and social history 2. Their use of alcohol, tobacco or illicit drugs 3. Their current medications and supplements 4. The patient's functional ability including ADL's, fall risks, home safety risks and hearing or visual             impairment. 5. Diet and physical activities 6. Evidence for depression or mood disorders 7.         Updated provider list Cognitive evaluation was performed and recorded on pt medicare questionnaire form. The patients weight, height, BMI and visual acuity have been recorded in the chart  I have made referrals, counseling and provided education to the patient based review of the above and I have provided the pt with a written personalized care plan for preventive services.   Diabetes: Improved control with increase in actos (max) and on max glipizide.  On insulin 30 Units Using victoza 1.8 mg daily. Decrease appetite. Lab Results  Component Value Date   HGBA1C 6.7* 09/12/2015  Using medications without difficulties:None  Hypoglycemic episodes: rarely Hyperglycemic episodes:None  Feet problems:None  Blood Sugars averaging: FBS 77-105 , 2 hour postprandial 140s eye exam within last year: yes Wt Readings from Last 3 Encounters:  09/13/15 226 lb 4 oz (102.626 kg)  05/19/15 229 lb 8 oz (104.101 kg)  02/15/15 227 lb 8 oz (103.193 kg)   Hypertension: At goal per [facility] < 140/90 on HCTZ BP Readings from Last 3 Encounters:  09/13/15 140/70  06/20/15 139/74  05/19/15 118/70  Using medication without problems or lightheadedness: None  Chest pain with exertion:None  Edema:Swelling improved on HCTZ Short of breath:None  Average home BPs: not checking  Other issues:   Elevated Cholesterol: Good control LDL and trigs on gemfibrozil, fish oils  on current med. Lab Results   Component Value Date   CHOL 165 09/12/2015   HDL 39.30 09/12/2015   LDLCALC 104* 09/12/2015   LDLDIRECT 59.4 09/26/2009   TRIG 113.0 09/12/2015   CHOLHDL 4 09/12/2015  Using medications without problems:none  Muscle aches: None  Diet compliance: Good  Exercise: Walking  Other complaints:   Dr. Jacelyn Grip for [facility]: s/p nerve deadening procedure. Has helped some.  She refuses surgery.  Has had repeat ESI.Marland Kitchen Helped some. Using norco 1 tab po once to twice daily.  Using ibuprofen every other day.  Using gabapentin 1 three times a day.  She was taking both robaxin and cyclobenzaprine.. Will have her only use cyclobenzaprine. Okay now to return to exercise.      Review of Systems  Constitutional: Negative for fever and fatigue.  HENT: Negative for ear pain.  Eyes: Negative for pain.  Respiratory: Negative for chest tightness and shortness of breath.  Cardiovascular: Negative for chest pain, palpitations and leg swelling.  Gastrointestinal: Negative for abdominal pain.  Genitourinary: Negative for dysuria.       Objective:   Physical Exam  Constitutional: Vital signs are normal. She appears well-developed and well-nourished. She is cooperative. Non-toxic appearance. She does not appear ill. No distress.  HENT:  Head: Normocephalic.  Right Ear: Hearing, tympanic membrane, external ear and ear canal normal.  Left Ear: Hearing, tympanic membrane, external ear and ear canal normal.  Nose: Nose normal.  Eyes: Conjunctivae, EOM and lids are normal. Pupils are equal, round, and reactive to light. Lids are everted and swept, no foreign bodies found.  Neck: Trachea normal and normal range of motion. Neck supple. Carotid bruit is not present. No mass and no thyromegaly present.  Cardiovascular: Normal rate, regular rhythm, S1 normal, S2 normal, normal heart sounds and intact distal pulses. Exam reveals no gallop.  No murmur heard. Pulmonary/Chest: Effort normal  and breath sounds normal. No respiratory distress. She has no wheezes. She has no rhonchi. She has no rales.  Abdominal: Soft. Normal appearance and bowel sounds are normal. She exhibits no distension, no fluid wave, no abdominal bruit and no mass. There is no hepatosplenomegaly. There is no tenderness. There is no rebound, no guarding and no CVA tenderness. No hernia.  Genitourinary: Vagina normal and uterus normal. No breast swelling, tenderness, discharge or bleeding. Pelvic exam was performed with patient supine. There is no rash, tenderness or lesion on the right labia. There is no rash, tenderness or lesion on the left labia. Uterus is not enlarged and not tender. Cervix exhibits no motion tenderness, no discharge and no friability. Right adnexum displays no mass, no tenderness and no fullness. Left adnexum displays no mass, no tenderness and no fullness.  Lymphadenopathy:   She has no cervical adenopathy.   She has no axillary adenopathy.  Neurological: She is alert. She has normal strength. No cranial nerve deficit or sensory deficit.  Skin: Skin is warm, dry and intact. No rash noted.  Psychiatric: Her speech is normal and behavior is normal. Judgment normal. Her mood appears not anxious. Cognition and memory are normal. She does not exhibit a depressed mood.   Diabetic foot exam: Normal inspection No skin breakdown No calluses  Normal DP pulses Normal sensation to light touch and monofilament Nails normal        Assessment & Plan:  The patient's preventative maintenance and recommended screening tests for an annual wellness exam were reviewed in full today. Brought up to date unless services declined.  Counselled on the importance of diet, exercise, and its role in overall health and mortality. The patient's FH and SH was reviewed, including their home life, tobacco status, and drug and alcohol status.    Vaccines:Uptodate tdap, zoster, PNA, will get flu shot today.   Colon: nml colonoscopy in 2012 Dr. Deatra Ina, repeat in 10 years Mammo: 08/10/2015 nml DEXA: nml 2012, repeat age 62 PAP/DVE: nml 2015, on q 3 year schedule. GHW:EXHBZJI  Hep C: will do with next set of labs. Smoker: recommended quitting smoking! Contemplative, trying to wean down. 35 plus pack year smoker.

## 2015-09-13 NOTE — Telephone Encounter (Signed)
When patient was leaving her appointment today she requested a refill on her Amitriptyline.  Patient stated that she has not taken this for a while, but feels that she needs to be back on it.

## 2015-09-13 NOTE — Telephone Encounter (Signed)
PLEASE NOTE: All timestamps contained within this report are represented as [place] Standard Time. CONFIDENTIALTY NOTICE: This fax transmission is intended only for the addressee. It contains information that is legally privileged, confidential or otherwise protected from use or disclosure. If you are not the intended recipient, you are strictly prohibited from reviewing, disclosing, copying using or disseminating any of this information or taking any action in reliance on or regarding this information. If you have received this fax in error, please notify us immediately by telephone so that we can arrange for its return to [place]. Phone: [phone], Toll-Free: [phone], Fax: [phone] Page: 1 of 1 Call Id: [id number] [place] Patient Name: Sandra Ferrell Gender: Female DOB: [birth year] Age: 54 Y 62 M Return Phone Number: [phone] (Primary), [phone] (Secondary) Address: City/State/Zip: [place] [facility] - Client Client Site [facility] - Day Physician [name], [place] Contact Type Call Call Type Triage / Clinical Relationship To Patient Self Appointment Disposition EMR Appointment Not Necessary Info pasted into Epic No Return Phone Number [phone] (Primary) Chief Complaint Prescription Refill or Medication Request (non symptomatic) Initial Comment Caller States Called in 3 Rx's today and they are not at pharmacy , she was seen at office for lab work today Nurse Assessment Nurse: Angeline Slim, RN, [place] Date/Time ([place] Time): 09/12/2015 7:37:17 PM Confirm and document reason for call. If symptomatic, describe symptoms. ---Caller states she is out of her cyclobenzaprine and she needs a refill sent to pharmacy Has the patient traveled out of the country within the last 30 days? ---Not Applicable Does the patient have any new or  worsening symptoms? ---No Guidelines Guideline Title Affirmed Question Affirmed Notes Nurse Date/Time (Eastern Time) Disp. Time Eilene Ghazi Time) Disposition Final User 09/12/2015 7:39:39 PM Clinical Call Yes Angeline Slim, RN, [place] After Care Instructions Given Call Event Type User Date / Time Description

## 2015-09-13 NOTE — Assessment & Plan Note (Signed)
Follow. Decrease calcium in diet. Not on supplement.

## 2015-09-13 NOTE — Addendum Note (Signed)
Addended by: Emelia Salisbury C on: 09/13/2015 11:00 AM   Modules accepted: Orders

## 2015-09-17 ENCOUNTER — Other Ambulatory Visit: Payer: Self-pay | Admitting: Family Medicine

## 2015-09-18 NOTE — Telephone Encounter (Signed)
Last office visit 09/13/2015.  Last refilled 08/23/2015 for #90 with no refills. Ok to refill?

## 2015-09-19 ENCOUNTER — Telehealth: Payer: Self-pay | Admitting: Family Medicine

## 2015-09-19 NOTE — Telephone Encounter (Signed)
Cecille Rubin at [facility] called to make sure you received patient Diabetic Eye Exam report. She faxed it here last week.

## 2015-09-19 NOTE — Telephone Encounter (Signed)
Pt left v/m requesting status of Tylenol # 3.

## 2015-09-20 ENCOUNTER — Other Ambulatory Visit: Payer: Self-pay | Admitting: Family Medicine

## 2015-09-20 NOTE — Telephone Encounter (Signed)
Tylenol #3 called into [facility] on [address]. 

## 2015-09-20 NOTE — Telephone Encounter (Signed)
Left message for Cecille Rubin at [facility] that we did received Sandra Ferrell eye exam report she faxed me last week.  It was entered into her EMR and sent of scanning.

## 2015-09-20 NOTE — Telephone Encounter (Signed)
Sandra Ferrell notified prescription has been called in to her pharmacy. 

## 2015-09-20 NOTE — Telephone Encounter (Signed)
Have we received this? Have you entered in in EMR? I do not recall.

## 2015-10-06 ENCOUNTER — Other Ambulatory Visit: Payer: Self-pay | Admitting: Family Medicine

## 2015-10-06 NOTE — Telephone Encounter (Signed)
Pt called and wanted Dr. [name] to know she is leaving town on 10/09/15 and will not return until 10/19/15.  She wanted PCP to know why she is picking up meds 1 week early.  Best number to call is [phone]

## 2015-10-06 NOTE — Telephone Encounter (Signed)
Last office visit 09/13/2015.  Both medications last refilled 09/13/2015.  Ok to refill?

## 2015-10-06 NOTE — Telephone Encounter (Signed)
Rx called in as directed.   

## 2015-10-10 ENCOUNTER — Other Ambulatory Visit: Payer: Self-pay | Admitting: Family Medicine

## 2015-10-10 NOTE — Telephone Encounter (Signed)
Last office visit 09/13/2015.  Last refilled 09/13/2015 for #30 with no refills.  Ok to refill?

## 2015-10-14 ENCOUNTER — Other Ambulatory Visit: Payer: Self-pay | Admitting: Family Medicine

## 2015-10-15 NOTE — Telephone Encounter (Signed)
Last filled 09/20/2015--please advise

## 2015-10-17 ENCOUNTER — Other Ambulatory Visit: Payer: Self-pay | Admitting: Family Medicine

## 2015-10-17 NOTE — Telephone Encounter (Signed)
Rx called in to pharmacy. 

## 2015-10-18 DIAGNOSIS — M4696 Unspecified inflammatory spondylopathy, lumbar region: Secondary | ICD-10-CM | POA: Diagnosis not present

## 2015-10-18 DIAGNOSIS — M545 Low back pain: Secondary | ICD-10-CM | POA: Diagnosis not present

## 2015-10-20 ENCOUNTER — Telehealth: Payer: Self-pay | Admitting: Family Medicine

## 2015-10-20 NOTE — Telephone Encounter (Signed)
Pt called to let you know that you will be receiving a fax from [facility] dr Lynann Bologna.  She saw him 12/13 her back is worse

## 2015-11-01 ENCOUNTER — Other Ambulatory Visit: Payer: Self-pay

## 2015-11-01 ENCOUNTER — Other Ambulatory Visit: Payer: Self-pay | Admitting: Family Medicine

## 2015-11-01 MED ORDER — INSULIN PEN NEEDLE 32G X 6 MM MISC: Status: DC

## 2015-11-01 NOTE — Telephone Encounter (Signed)
Last office visit 09/13/2015.  Gabapentin last refilled 06/23/2015 for #90 with 3 refills.  Methocarbamol & Alprazolam last refilled 10/06/2015.  Ok to refill?

## 2015-11-01 NOTE — Telephone Encounter (Signed)
Alprazolam called in to [facility] on [address]. 

## 2015-11-01 NOTE — Telephone Encounter (Signed)
Pt request Novofine 32 G x 85mm needle for victoza administration; advised done [facility].

## 2015-11-02 ENCOUNTER — Telehealth: Payer: Self-pay

## 2015-11-02 NOTE — Telephone Encounter (Signed)
methocarbomal can be dosed TID - there should be no problem doing that change.

## 2015-11-02 NOTE — Telephone Encounter (Signed)
Pt left v/m; pt was seen at [facility] on 10/21/15; pts back is worse; pt has scoliosis and arthritis in back; pt wants to know if Dr [name] has received report from [place] and pt wants to know if methocarbamol could be increased to 3 daily instead of 2 daily. Pt has appt at [place] 11/09/14 for epidural injection. Pt request cb.

## 2015-11-03 ENCOUNTER — Other Ambulatory Visit: Payer: Self-pay

## 2015-11-03 MED ORDER — ACCU-CHEK AVIVA PLUS VI STRP: ORAL_STRIP | Status: DC

## 2015-11-03 MED ORDER — ADJUSTABLE LANCING DEVICE MISC: Status: AC

## 2015-11-03 MED ORDER — ALCOHOL SWABS PADS: MEDICATED_PAD | Status: AC

## 2015-11-03 MED ORDER — METHOCARBAMOL 500 MG PO TABS: 500.0000 mg | ORAL_TABLET | Freq: Three times a day (TID) | ORAL | Status: DC

## 2015-11-03 NOTE — Telephone Encounter (Signed)
[place] notified ok to increase Methocarbamol to three times a day per Dr. Lorelei Pont.  New prescription sent into [facility] on [address].

## 2015-11-03 NOTE — Telephone Encounter (Signed)
Pt changing diabetic suppliers to [employer]; pt last annual 09/13/15. Refilled per protocol and pt advised done.

## 2015-11-08 ENCOUNTER — Other Ambulatory Visit: Payer: Self-pay | Admitting: Family Medicine

## 2015-11-09 ENCOUNTER — Other Ambulatory Visit: Payer: Self-pay | Admitting: Family Medicine

## 2015-11-09 NOTE — Telephone Encounter (Signed)
Last filled #90 10/16/15.  CPE 09/13/15.

## 2015-11-10 ENCOUNTER — Encounter: Payer: Self-pay | Admitting: *Deleted

## 2015-11-10 DIAGNOSIS — M47816 Spondylosis without myelopathy or radiculopathy, lumbar region: Secondary | ICD-10-CM | POA: Diagnosis not present

## 2015-11-10 NOTE — Telephone Encounter (Signed)
This encounter was created in error - please disregard.

## 2015-11-10 NOTE — Telephone Encounter (Signed)
Pt left v/m; pt had nerves burned today and pt is running low on tylenol # 3 and due to snow forecast request to get med early. Pt request cb.

## 2015-11-11 NOTE — Telephone Encounter (Signed)
Rx called in to pharmacy. 

## 2015-11-11 NOTE — Telephone Encounter (Signed)
Pt called for status of refill; advised med called in today at 10:20; pt voiced understanding.

## 2015-11-11 NOTE — Telephone Encounter (Signed)
Called pt was unable to lmovm due to being full

## 2015-11-28 ENCOUNTER — Other Ambulatory Visit: Payer: Self-pay | Admitting: Family Medicine

## 2015-11-28 NOTE — Telephone Encounter (Signed)
Last office visit 09/13/2015.  Last refilled 11/01/2015 for #90 with no refills.  Ok to refill?

## 2015-11-29 ENCOUNTER — Telehealth: Payer: Self-pay | Admitting: Family Medicine

## 2015-11-29 NOTE — Telephone Encounter (Signed)
Pt called stating the pharmacy informed her that both rx's were not called in. She is confused on why one was and the other wasn't. Can you contact [facility] to confirm which rx they received and which needs to be sent in? Also once it has been handled, please contact the pt back at [phone].

## 2015-11-29 NOTE — Telephone Encounter (Signed)
Alprazolam called in to [facility] on [address]. 

## 2015-11-29 NOTE — Telephone Encounter (Signed)
Gabapentin was sent in electronically to [facility] today at 8:28 am.  Alprazolam was called into [facility] at 8:49 am.  [employer] notified patient.  Spoke with [facility] and they do have both prescriptions.

## 2015-12-07 ENCOUNTER — Other Ambulatory Visit: Payer: Self-pay | Admitting: Family Medicine

## 2015-12-09 ENCOUNTER — Other Ambulatory Visit: Payer: Self-pay | Admitting: Family Medicine

## 2015-12-09 NOTE — Telephone Encounter (Signed)
Last office visit 09/13/2015.  Last refilled 11/10/2015 for #90 with no refills.  Ok to refill?

## 2015-12-09 NOTE — Telephone Encounter (Addendum)
Tylenol #3 called into [facility]

## 2015-12-13 DIAGNOSIS — M4806 Spinal stenosis, lumbar region: Secondary | ICD-10-CM | POA: Diagnosis not present

## 2015-12-26 ENCOUNTER — Other Ambulatory Visit: Payer: Self-pay | Admitting: Family Medicine

## 2015-12-26 NOTE — Telephone Encounter (Signed)
Last office visit 09/13/2015.  Ok to refill?

## 2015-12-27 NOTE — Telephone Encounter (Signed)
Alprazolam called into [facility]. 

## 2016-01-03 ENCOUNTER — Other Ambulatory Visit: Payer: Self-pay | Admitting: Family Medicine

## 2016-01-03 NOTE — Telephone Encounter (Signed)
Last office visit 09/13/2015.  Last refilled 12/09/2015 for #90 with no refills.  Ok to refill?

## 2016-01-04 NOTE — Telephone Encounter (Signed)
Tylenol #3 called into [facility] on [address]. 

## 2016-01-23 ENCOUNTER — Other Ambulatory Visit: Payer: Self-pay | Admitting: Family Medicine

## 2016-01-23 NOTE — Telephone Encounter (Signed)
Last office visit 09/13/2015.  Last refilled 12/27/2015.  Ok to refill?

## 2016-01-24 NOTE — Telephone Encounter (Signed)
Alprazolam called in to [facility] on [address]. 

## 2016-02-01 ENCOUNTER — Other Ambulatory Visit: Payer: Self-pay | Admitting: Family Medicine

## 2016-02-01 NOTE — Telephone Encounter (Signed)
Last office visit 09/13/2015.  Last refilled 01/03/2016 for #90 with no refills.  Ok to refill?

## 2016-02-02 NOTE — Telephone Encounter (Signed)
Pt left voicemail at Triage requesting status of refill request

## 2016-02-02 NOTE — Telephone Encounter (Signed)
Tylenol #3 called into [facility] on [address]. 

## 2016-02-04 ENCOUNTER — Other Ambulatory Visit: Payer: Self-pay | Admitting: Family Medicine

## 2016-02-06 ENCOUNTER — Other Ambulatory Visit: Payer: Self-pay | Admitting: Family Medicine

## 2016-02-20 ENCOUNTER — Other Ambulatory Visit: Payer: Self-pay | Admitting: Family Medicine

## 2016-02-20 NOTE — Telephone Encounter (Signed)
Last office visit 09/13/2015.  Last refilled 01/24/2016.  Ok to refill?

## 2016-02-21 NOTE — Telephone Encounter (Signed)
Alprazolam called in to [facility] on [address]. 

## 2016-03-02 ENCOUNTER — Other Ambulatory Visit: Payer: Self-pay | Admitting: Family Medicine

## 2016-03-02 NOTE — Telephone Encounter (Signed)
Pt called to ck on status of tylenol # 3; advised pt called in today at 2:25 pm. Pt will ck with pharmacy later today.

## 2016-03-02 NOTE — Telephone Encounter (Signed)
Last office visit 09/13/2015.  Last refilled 02/02/2016 for #90 with no refills.  Ok to refill?

## 2016-03-02 NOTE — Telephone Encounter (Signed)
Tylenol #3 called into [facility] on [address]. 

## 2016-03-13 ENCOUNTER — Other Ambulatory Visit: Payer: Self-pay

## 2016-03-13 ENCOUNTER — Telehealth: Payer: Self-pay | Admitting: Family Medicine

## 2016-03-13 ENCOUNTER — Ambulatory Visit: Payer: Self-pay | Admitting: Family Medicine

## 2016-03-13 DIAGNOSIS — Z0289 Encounter for other administrative examinations: Secondary | ICD-10-CM

## 2016-03-13 NOTE — Telephone Encounter (Signed)
Patient called and said she wasn't feeling well last night and slept until 2:00 this afternoon.  I rescheduled her lab appointment to 03/19/16 and her follow up on 03/22/16.

## 2016-03-13 NOTE — Telephone Encounter (Signed)
Patient did not come for their scheduled appointment today 6 month follow up Please let me know if the patient needs to be contacted immediately for follow up or if no follow up is necessary.

## 2016-03-14 DIAGNOSIS — M4806 Spinal stenosis, lumbar region: Secondary | ICD-10-CM | POA: Diagnosis not present

## 2016-03-18 ENCOUNTER — Other Ambulatory Visit: Payer: Self-pay | Admitting: Family Medicine

## 2016-03-19 ENCOUNTER — Other Ambulatory Visit: Payer: Self-pay | Admitting: Family Medicine

## 2016-03-19 ENCOUNTER — Encounter: Payer: Self-pay | Admitting: Radiology

## 2016-03-19 ENCOUNTER — Other Ambulatory Visit (INDEPENDENT_AMBULATORY_CARE_PROVIDER_SITE_OTHER): Payer: Medicare Other

## 2016-03-19 DIAGNOSIS — Z79899 Other long term (current) drug therapy: Secondary | ICD-10-CM | POA: Diagnosis not present

## 2016-03-19 DIAGNOSIS — Z7289 Other problems related to lifestyle: Secondary | ICD-10-CM | POA: Diagnosis not present

## 2016-03-19 DIAGNOSIS — Z1159 Encounter for screening for other viral diseases: Secondary | ICD-10-CM

## 2016-03-19 DIAGNOSIS — E119 Type 2 diabetes mellitus without complications: Secondary | ICD-10-CM | POA: Diagnosis not present

## 2016-03-19 LAB — LIPID PANEL
CHOL/HDL RATIO: 4
Cholesterol: 168 mg/dL (ref 0–200)
HDL: 38 mg/dL — ABNORMAL LOW (ref >39.00)
LDL Cholesterol: 108 mg/dL — ABNORMAL HIGH (ref 0–99)
NonHDL: 129.54
TRIGLYCERIDES: 107 mg/dL (ref 0.0–149.0)
VLDL: 21.4 mg/dL (ref 0.0–40.0)

## 2016-03-19 LAB — COMPREHENSIVE METABOLIC PANEL
ALK PHOS: 104 U/L (ref 39–117)
ALT: 14 U/L (ref 0–35)
AST: 16 U/L (ref 0–37)
Albumin: 3.9 g/dL (ref 3.5–5.2)
BUN: 17 mg/dL (ref 6–23)
CO2: 30 meq/L (ref 19–32)
Calcium: 9.3 mg/dL (ref 8.4–10.5)
Chloride: 99 mEq/L (ref 96–112)
Creatinine, Ser: 0.65 mg/dL (ref 0.40–1.20)
GFR: 100.27 mL/min (ref >60.00)
GLUCOSE: 190 mg/dL — AB (ref 70–99)
POTASSIUM: 3.5 meq/L (ref 3.5–5.1)
Sodium: 138 mEq/L (ref 135–145)
Total Bilirubin: 0.4 mg/dL (ref 0.2–1.2)
Total Protein: 7.2 g/dL (ref 6.0–8.3)

## 2016-03-19 LAB — HEMOGLOBIN A1C: HEMOGLOBIN A1C: 7.8 % — AB (ref 4.6–6.5)

## 2016-03-19 NOTE — Telephone Encounter (Signed)
Last office visit 09/13/2015.  Last refilled 02/21/2016.  Ok to refill?

## 2016-03-19 NOTE — Telephone Encounter (Signed)
Last office visit 09/13/2015.  Last refilled 02/21/2016 for #90 with no refills.  Ok to refill?

## 2016-03-20 ENCOUNTER — Other Ambulatory Visit: Payer: Self-pay | Admitting: Family Medicine

## 2016-03-20 LAB — HEPATITIS C ANTIBODY: HCV AB: NEGATIVE

## 2016-03-20 NOTE — Telephone Encounter (Signed)
Alprazolam called in to [facility] on [address]. 

## 2016-03-20 NOTE — Telephone Encounter (Signed)
Pt request status of alprazolam refill to [facility]; advised pt called in today at 10 am . Pt will ck with pharmacy.

## 2016-03-22 ENCOUNTER — Encounter: Payer: Self-pay | Admitting: Family Medicine

## 2016-03-22 ENCOUNTER — Ambulatory Visit (INDEPENDENT_AMBULATORY_CARE_PROVIDER_SITE_OTHER): Payer: Medicare Other | Admitting: Family Medicine

## 2016-03-22 VITALS — BP 138/62 | HR 102 | Temp 98.3°F | Ht 58.25 in | Wt 224.5 lb

## 2016-03-22 DIAGNOSIS — J440 Chronic obstructive pulmonary disease with acute lower respiratory infection: Secondary | ICD-10-CM

## 2016-03-22 DIAGNOSIS — J449 Chronic obstructive pulmonary disease, unspecified: Secondary | ICD-10-CM | POA: Insufficient documentation

## 2016-03-22 DIAGNOSIS — I10 Essential (primary) hypertension: Secondary | ICD-10-CM

## 2016-03-22 DIAGNOSIS — E785 Hyperlipidemia, unspecified: Secondary | ICD-10-CM

## 2016-03-22 DIAGNOSIS — E119 Type 2 diabetes mellitus without complications: Secondary | ICD-10-CM | POA: Diagnosis not present

## 2016-03-22 DIAGNOSIS — F17200 Nicotine dependence, unspecified, uncomplicated: Secondary | ICD-10-CM

## 2016-03-22 DIAGNOSIS — J209 Acute bronchitis, unspecified: Secondary | ICD-10-CM

## 2016-03-22 LAB — HM DIABETES FOOT EXAM

## 2016-03-22 MED ORDER — AZITHROMYCIN 250 MG PO TABS: ORAL_TABLET | ORAL | Status: DC

## 2016-03-22 NOTE — Assessment & Plan Note (Signed)
Inadequate control on current regimen but forgets victoza. Restart exercsie, and low carb diet. Make sure to take victoza.

## 2016-03-22 NOTE — Progress Notes (Signed)
HPI  55 year old female pt presents for 6 month DM follow up.   She has been having chest congestion, cough brownish  Mucus ongoing x 2 months.  Using symbicort  dailyand proair as needed 4 times daily.  She is still smoking   She continues to have back and leg pan. Has follow up appt with Dr. Lynann Bologna tommorow.  Using insulin glargine , now at 35 units at night.  No longer taking glipizide Still taking Metformin, Actos, Victoza  Diabetes:  Much improved control but not yet at goal on current regimen Lab Results  Component Value Date   HGBA1C 7.8* 03/19/2016  Using medications without difficulties: 35 units insulin,Metformin, Actos, Victoza ( occ forgets) Hypoglycemic episodes: none Hyperglycemic episodes: none Feet problems: no ulcers Blood Sugars averaging: fasting glucose 110-120 2 hr post prandial 150-160 eye exam within last year: yes  Wt Readings from Last 3 Encounters:  03/22/16 224 lb 8 oz (101.833 kg)  09/13/15 226 lb 4 oz (102.626 kg)  05/19/15 229 lb 8 oz (104.101 kg)   Exercise: none given back pain Diet; eating too much fruit, but less   Elevated Cholesterol:  LDL almost at goal  < 100, gemfibrozil. Lab Results  Component Value Date   CHOL 168 03/19/2016   HDL 38.00* 03/19/2016   LDLCALC 108* 03/19/2016   LDLDIRECT 59.4 09/26/2009   TRIG 107.0 03/19/2016   CHOLHDL 4 03/19/2016  Using medications without problems: None  Hypertension:    BP Readings from Last 3 Encounters:  03/22/16 138/62  09/13/15 140/70  06/20/15 139/74  Using medication without problems or lightheadedness: None Chest pain with exertion:None Edema: occ Short of breath:  Average home BPs: not checking Other issues:   Depression, major: well controlled on  amitriptyline and alprazolam as needed.  Social History /Family History/Past Medical History reviewed and updated if needed.  Review of Systems  Constitutional: Negative for fever and fatigue.  HENT: Negative for  ear pain.  Eyes: Negative for pain.  Respiratory: Negative for chest tightness and shortness of breath.  Cardiovascular: Negative for chest pain, palpitations and leg swelling.  Gastrointestinal: Negative for abdominal pain.  Genitourinary: Negative for dysuria.        Objective:   Physical Exam  Constitutional: Vital signs are normal. She appears well-developed and well-nourished. She is cooperative. Non-toxic appearance. She does not appear ill. No distress.  obese  HENT:  Head: Normocephalic.  Right Ear: Hearing, tympanic membrane, external ear and ear canal normal. Tympanic membrane is not erythematous, not retracted and not bulging.  Left Ear: Hearing, tympanic membrane, external ear and ear canal normal. Tympanic membrane is not erythematous, not retracted and not bulging.  Nose: No mucosal edema or rhinorrhea. Right sinus exhibits no maxillary sinus tenderness and no frontal sinus tenderness. Left sinus exhibits no maxillary sinus tenderness and no frontal sinus tenderness.  Mouth/Throat: Uvula is midline, oropharynx is clear and moist and mucous membranes are normal.  Eyes: Conjunctivae, EOM and lids are normal. Pupils are equal, round, and reactive to light. Lids are everted and swept, no foreign bodies found.  Neck: Trachea normal and normal range of motion. Neck supple. Carotid bruit is not present. No thyroid mass and no thyromegaly present.  Cardiovascular: Normal rate, regular rhythm, S1 normal, S2 normal, normal heart sounds, intact distal pulses and normal pulses. Exam reveals no gallop and no friction rub.  No murmur heard. Pulmonary/Chest: Effort normal and breath sounds normal. No tachypnea. No respiratory distress. She has no  decreased breath sounds. She has no wheezes. She has no rhonchi. She has no rales.  Abdominal: Soft. Normal appearance and bowel sounds are normal. There is no tenderness.  Neurological: She is alert.  Skin: Skin is warm, dry and intact.  No rash noted.  Psychiatric: Her speech is normal and behavior is normal. Judgment and thought content normal. Her mood appears not anxious. Cognition and memory are normal. She does not exhibit a depressed mood.   Diabetic foot exam: Normal inspection No skin breakdown No calluses  Normal DP pulses Normal sensation to light touch and monofilament Nails normal

## 2016-03-22 NOTE — Patient Instructions (Addendum)
Restart walking as able 3-5 times a week.  Make sure to take victoza as directed.  Continue working on low carb diet.  Complete antibiotics course, call if cough and shortness of breath not improving.

## 2016-03-22 NOTE — Assessment & Plan Note (Signed)
Well controlled. Continue current medication.  

## 2016-03-22 NOTE — Assessment & Plan Note (Signed)
Treat with antibiotics. Will try to hold off on prednisone. Use proair as needed and continue symbicort as controller.

## 2016-03-22 NOTE — Assessment & Plan Note (Signed)
Almost at goal  On gemfibrozil.

## 2016-03-22 NOTE — Assessment & Plan Note (Signed)
Counseled on cessation. She is contemplative.

## 2016-03-23 DIAGNOSIS — M4806 Spinal stenosis, lumbar region: Secondary | ICD-10-CM | POA: Diagnosis not present

## 2016-03-23 DIAGNOSIS — M545 Low back pain: Secondary | ICD-10-CM | POA: Diagnosis not present

## 2016-03-26 ENCOUNTER — Other Ambulatory Visit: Payer: Self-pay | Admitting: Family Medicine

## 2016-03-28 ENCOUNTER — Other Ambulatory Visit: Payer: Self-pay | Admitting: Family Medicine

## 2016-03-28 ENCOUNTER — Other Ambulatory Visit: Payer: Self-pay | Admitting: Orthopedic Surgery

## 2016-03-28 DIAGNOSIS — M48061 Spinal stenosis, lumbar region without neurogenic claudication: Secondary | ICD-10-CM

## 2016-03-28 NOTE — Telephone Encounter (Signed)
Last office visit 03/22/2016.  Last refilled 03/02/2016 for #90 with no refills. Ok to refill?

## 2016-03-29 NOTE — Telephone Encounter (Signed)
Tylenol #[facility] called into [facility] [address].

## 2016-03-30 ENCOUNTER — Ambulatory Visit
Admission: RE | Admit: 2016-03-30 | Discharge: 2016-03-30 | Disposition: A | Discharge status: Home / Self Care | Payer: Medicare Other | Source: Ambulatory Visit | Attending: Orthopedic Surgery | Admitting: Orthopedic Surgery

## 2016-03-30 DIAGNOSIS — M4806 Spinal stenosis, lumbar region: Secondary | ICD-10-CM | POA: Diagnosis not present

## 2016-03-30 DIAGNOSIS — M48061 Spinal stenosis, lumbar region without neurogenic claudication: Secondary | ICD-10-CM

## 2016-03-30 NOTE — Telephone Encounter (Signed)
Pt called to ck on status of refill; advised pt called in on 03/29/16. Pt will ck with pharmacy.

## 2016-04-02 ENCOUNTER — Other Ambulatory Visit: Payer: Self-pay | Admitting: Family Medicine

## 2016-04-06 DIAGNOSIS — Z716 Tobacco abuse counseling: Secondary | ICD-10-CM | POA: Diagnosis not present

## 2016-04-06 DIAGNOSIS — M5416 Radiculopathy, lumbar region: Secondary | ICD-10-CM | POA: Diagnosis not present

## 2016-04-06 DIAGNOSIS — F17210 Nicotine dependence, cigarettes, uncomplicated: Secondary | ICD-10-CM | POA: Diagnosis not present

## 2016-04-09 ENCOUNTER — Other Ambulatory Visit: Payer: Self-pay | Admitting: Orthopedic Surgery

## 2016-04-13 ENCOUNTER — Encounter: Payer: Self-pay | Admitting: Family Medicine

## 2016-04-13 ENCOUNTER — Ambulatory Visit (INDEPENDENT_AMBULATORY_CARE_PROVIDER_SITE_OTHER): Payer: Medicare Other | Admitting: Family Medicine

## 2016-04-13 VITALS — BP 118/58 | HR 92 | Temp 98.3°F | Ht 58.25 in | Wt 224.8 lb

## 2016-04-13 DIAGNOSIS — Z01818 Encounter for other preprocedural examination: Secondary | ICD-10-CM | POA: Diagnosis not present

## 2016-04-13 DIAGNOSIS — I10 Essential (primary) hypertension: Secondary | ICD-10-CM | POA: Diagnosis not present

## 2016-04-13 DIAGNOSIS — J449 Chronic obstructive pulmonary disease, unspecified: Secondary | ICD-10-CM | POA: Diagnosis not present

## 2016-04-13 DIAGNOSIS — E119 Type 2 diabetes mellitus without complications: Secondary | ICD-10-CM

## 2016-04-13 DIAGNOSIS — Z79899 Other long term (current) drug therapy: Secondary | ICD-10-CM | POA: Diagnosis not present

## 2016-04-13 DIAGNOSIS — K219 Gastro-esophageal reflux disease without esophagitis: Secondary | ICD-10-CM

## 2016-04-13 NOTE — Assessment & Plan Note (Signed)
Use PPI at time of surgery.

## 2016-04-13 NOTE — Progress Notes (Signed)
   Subjective:    Patient ID: Sandra Ferrell, female    DOB: 12/29/1960, 55 y.o.   MRN: [id number]  HPI  55 year old female presents for pre op clearance for upcoming L4l5 TLIF on 04/18/2016 with Dr. Lynann Bologna.  She has some significant risk factors for surgery including obesity, , sleep apnea, HTN, tobacco abuse and insulin dependant diabetes.  She has no past cardiac history.  She has a low risk Lexiscan Myoview and a normal ECHO EF 60-65% in 2012.  No current chest pain.  She has moderate control of her diabetes on her current doses of medication. Lab Results  Component Value Date   HGBA1C 7.8* 03/19/2016  Lately if taking victoza. FBS 89-135, rare > 200.  COPD, well controlled on symbicort. Using albuterol only prn. She denies shortness of breath today. Getting over bronchitis, now, only occ wheeze. Working on smoking cessation.     Social History /Family History/Past Medical History reviewed and updated if needed.   Review of Systems  Constitutional: Negative for fever and fatigue.  HENT: Negative for congestion.   Eyes: Negative for pain.  Respiratory: Negative for cough and shortness of breath.   Cardiovascular: Negative for chest pain, palpitations and leg swelling.  Gastrointestinal: Negative for abdominal pain.  Genitourinary: Negative for dysuria and vaginal bleeding.  Musculoskeletal: Positive for back pain.  Neurological: Negative for syncope, light-headedness and headaches.  Psychiatric/Behavioral: Negative for dysphoric mood.       Objective:   Physical Exam  Constitutional: Vital signs are normal. She appears well-developed and well-nourished. She is cooperative.  Non-toxic appearance. She does not appear ill. No distress.  Morbid obesity  HENT:  Head: Normocephalic.  Right Ear: Hearing, tympanic membrane, external ear and ear canal normal. Tympanic membrane is not erythematous, not retracted and not bulging.  Left Ear: Hearing, tympanic membrane,  external ear and ear canal normal. Tympanic membrane is not erythematous, not retracted and not bulging.  Nose: No mucosal edema or rhinorrhea. Right sinus exhibits no maxillary sinus tenderness and no frontal sinus tenderness. Left sinus exhibits no maxillary sinus tenderness and no frontal sinus tenderness.  Mouth/Throat: Uvula is midline, oropharynx is clear and moist and mucous membranes are normal.  Eyes: Conjunctivae, EOM and lids are normal. Pupils are equal, round, and reactive to light. Lids are everted and swept, no foreign bodies found.  Neck: Trachea normal and normal range of motion. Neck supple. Carotid bruit is not present. No thyroid mass and no thyromegaly present.  Small oropharynx.  Cardiovascular: Normal rate, regular rhythm, S1 normal, S2 normal, normal heart sounds, intact distal pulses and normal pulses.  Exam reveals no gallop and no friction rub.   No murmur heard. Pulmonary/Chest: Effort normal and breath sounds normal. No tachypnea. No respiratory distress. She has no decreased breath sounds. She has no wheezes. She has no rhonchi. She has no rales.  occ wheeze  Abdominal: Soft. Normal appearance and bowel sounds are normal. There is no tenderness.  Neurological: She is alert.  Skin: Skin is warm, dry and intact. No rash noted.  Psychiatric: Her speech is normal and behavior is normal. Judgment and thought content normal. Her mood appears not anxious. Cognition and memory are normal. She does not exhibit a depressed mood.          Assessment & Plan:

## 2016-04-13 NOTE — Assessment & Plan Note (Signed)
Moderate control, improved lately on current regimen. Needs tight control after surgery for healing.

## 2016-04-13 NOTE — Patient Instructions (Addendum)
Keep working on smoking cessation.  Work on [employer], low carb diet.  Stop on way pout for drug screen.

## 2016-04-13 NOTE — H&P (Signed)
PREOPERATIVE H&P  Chief Complaint: Bilateral leg pain  HPI: Sandra Ferrell is a 55 y.o. female who presents with ongoing pain in the bilateral legs and LBP x 3 years  MRI reveals severe spinal stenosis, L4/5  Patient has failed multiple forms of conservative care and continues to have pain (see office notes for additional details regarding the patient's full course of treatment)  Past Medical History  Diagnosis Date  . Allergic rhinitis, cause unspecified   . Anxiety state, unspecified   . Extrinsic asthma with exacerbation   . Backache, unspecified   . Depressive disorder, not elsewhere classified   . Type II or unspecified type diabetes mellitus without mention of complication, not stated as uncontrolled   . Esophageal reflux   . Other and unspecified hyperlipidemia   . Osteoarthrosis, unspecified whether generalized or localized, unspecified site   . Other screening mammogram   . Routine gynecological examination   . Unspecified sleep apnea   . Tobacco use disorder   . Personal history of unspecified urinary disorder   . Routine general medical examination at a health care facility   . Heart murmur   . Hypertension   . Calculus of kidney   . Fibromyalgia   . Wears glasses   . Carpal tunnel syndrome, right   . Pneumonia    Past Surgical History  Procedure Laterality Date  . Appendectomy  1973  . Cholecystectomy  08/2006  . Transthoracic echocardiogram  02/2011    mild LVH, nl EF, mild diastolic dysfunction, no wall motion abnl  . Colonoscopy    . Anterior cervical decomp/discectomy fusion N/A 01/19/2015    Procedure: ANTERIOR CERVICAL DECOMPRESSION/DISCECTOMY FUSION 2 LEVELS;  Surgeon: Phylliss Bob, MD;  Location: [facility];  Service: Orthopedics;  Laterality: N/A;  Anterior cervical decompression fusion, cervical 5-6, cervical 6-7 with instrumentation and allograft   Social History   Social History  . Marital Status: Married    Spouse Name: N/A  . Number of  Children: N/A  . Years of Education: N/A   Occupational History  . Housewife    Social History Main Topics  . Smoking status: Current Every Day Smoker -- 1.00 packs/day for 35 years    Types: Cigarettes  . Smokeless tobacco: Never Used  . Alcohol Use: No  . Drug Use: No  . Sexual Activity: Not on file   Other Topics Concern  . Not on file   Social History Narrative   Moderate diet.    No living will, no HCPOA.   Family History  Problem Relation Age of Onset  . Stroke Father   . Colon cancer Cousin    Allergies  Allergen Reactions  . Benazepril Swelling    Angioedema  . Aspirin     REACTION: Burns stomach  . Diclofenac Sodium     REACTION: GI bleed  . Fluticasone-Salmeterol     REACTION: tongue was swollen and hives and itching  . Nsaids     REACTION: rectal bleeding  . Penicillins     REACTION: rash   Prior to Admission medications   Medication Sig Start Date End Date Taking? Authorizing Provider  ACCU-CHEK AVIVA PLUS test strip Check blood sugar twice a day and as directed. Dx E11.9 11/03/15   Jinny Sanders, MD  acetaminophen-codeine (TYLENOL #3) 300-30 MG tablet TAKE 1 TABLET BY MOUTH EVERY 8 HOURS AS NEEDED FOR PAIN 03/29/16   Amy Cletis Athens, MD  AIMSCO INSULIN SYR ULTRA THIN 31G X  5/16" 0.3 ML MISC  04/02/15   Historical Provider, MD  Alcohol Swabs PADS Check blood sugar twice a day and as directed. Dx E11.9 11/03/15   Jinny Sanders, MD  ALPRAZolam (XANAX) 0.5 MG tablet TAKE 1 TABLET BY MOUTH THREE TIMES DAILY 03/20/16   Amy Cletis Athens, MD  amitriptyline (ELAVIL) 50 MG tablet TAKE 1 TABLET(50 MG) BY MOUTH AT BEDTIME 03/20/16   Amy E [name], MD  azithromycin (ZITHROMAX) 250 MG tablet 2 tab po x 1 day then 1 tab po daily 03/22/16   Amy E Bedsole, MD  B-D ULTRAFINE III SHORT PEN 31G X 8 MM MISC USE AS DIRECTED WITH INSULIN PEN 03/26/15   Amy Cletis Athens, MD  Blood Glucose Monitoring Suppl (ACCU-CHEK AVIVA PLUS) W/DEVICE KIT  08/13/14   Historical Provider, MD    chlorpheniramine (CHLOR-TRIMETON) 4 MG tablet Take 4 mg by mouth 2 (two) times daily as needed for allergies.    Historical Provider, MD  gabapentin (NEURONTIN) 300 MG capsule TAKE 1 CAPSULE(300 MG) BY MOUTH THREE TIMES DAILY 03/20/16   Amy E [name], MD  gemfibrozil (LOPID) 600 MG tablet TAKE 1 TABLET BY MOUTH TWICE DAILY 03/26/16   Amy E [name], MD  hydrochlorothiazide (HYDRODIURIL) 25 MG tablet TAKE 1 TABLET BY MOUTH EVERY DAY 02/06/16   Amy Cletis Athens, MD  Insulin Glargine (LANTUS SOLOSTAR) 100 UNIT/ML Solostar Pen Inject 30 Units into the skin at bedtime. 06/21/15   Amy Cletis Athens, MD  Insulin Pen Needle (NOVOFINE) 32G X 6 MM MISC Use to take victoza once daily and as directed. 11/01/15   Amy Cletis Athens, MD  Lancet Devices (ADJUSTABLE LANCING DEVICE) MISC Check blood sugar twice a day and as directed. Dx E11.9 11/03/15   Jinny Sanders, MD  metFORMIN (GLUCOPHAGE) 1000 MG tablet TAKE 1 TABLET(1000 MG) BY MOUTH TWICE DAILY WITH A MEAL 03/26/16   Amy E [name], MD  methocarbamol (ROBAXIN) 500 MG tablet TAKE 1 TABLET(500 MG) BY MOUTH THREE TIMES DAILY 02/21/16   Amy Cletis Athens, MD  pioglitazone (ACTOS) 45 MG tablet TAKE 1 TABLET BY MOUTH EVERY DAY 02/04/16   Amy E [name], MD  PROAIR HFA 108 (90 Base) MCG/ACT inhaler INHALE 2 PUFFS BY MOUTH EVERY 4 HOURS AS NEEDED FOR WHEEZING OR SHORTNESS OF BREATH 04/02/16   Amy Cletis Athens, MD  ranitidine (ZANTAC) 150 MG tablet Take 150 mg by mouth 2 (two) times daily.    Historical Provider, MD  SYMBICORT 80-4.5 MCG/ACT inhaler INHALE 2 PUFFS BY MOUTH TWICE DAILY 01/23/16   Amy Cletis Athens, MD  VICTOZA 18 MG/3ML SOPN ADMINISTER 1.8 MG UNDER THE SKIN DAILY 10/17/15   Amy Cletis Athens, MD     All other systems have been reviewed and were otherwise negative with the exception of those mentioned in the HPI and as above.  Physical Exam: There were no vitals filed for this visit.  General: Alert, no acute distress Cardiovascular: No pedal edema Respiratory: No cyanosis, no use of  accessory musculature Skin: No lesions in the area of chief complaint Neurologic: Sensation intact distally Psychiatric: Patient is competent for consent with normal mood and affect Lymphatic: No axillary or cervical lymphadenopathy  MUSCULOSKELETAL: + TTP low back  Assessment/Plan: Spinal stenosis Plan for Procedure(s): LEFT SIDED LUMBAR 4-5 TRANSFORAMINAL LUMBAR INTERBODY FUSION WITH INSTRUMENTATION AND ALLOGRAFT   Sinclair Ship, MD 04/13/2016 8:16 AM

## 2016-04-13 NOTE — Assessment & Plan Note (Signed)
Recommended smoking cessation. Continue symbicort , use albuterol prn and incentive spirometry after surgery.

## 2016-04-13 NOTE — Assessment & Plan Note (Signed)
Moderate risk pt with moderate to high risk surgery. Pt's comorbidities moderately controlled. Clear for surgery.

## 2016-04-13 NOTE — Progress Notes (Signed)
Pre visit review using our clinic review tool, if applicable. No additional management support is needed unless otherwise documented below in the visit note. 

## 2016-04-13 NOTE — Assessment & Plan Note (Signed)
Well controlled. Continue current medication.  

## 2016-04-15 ENCOUNTER — Other Ambulatory Visit: Payer: Self-pay | Admitting: Family Medicine

## 2016-04-15 NOTE — Telephone Encounter (Signed)
Last office visit 04/13/16.  Last refilled 03/20/16.  Ok to refill?

## 2016-04-16 ENCOUNTER — Other Ambulatory Visit: Payer: Self-pay | Admitting: Family Medicine

## 2016-04-16 NOTE — Pre-Procedure Instructions (Addendum)
Sandra Ferrell  04/16/2016      [id number] - [place], [place] [address] [place] [address] Phone: [phone] Fax: [phone]    Your procedure is scheduled on 04/18/2016  Report to [place] at [facility] [employer] [facility]  Call this number if you have problems the morning of surgery:  [phone]   Remember:  Do not eat food or drink liquids after midnight.      Take these medicines the morning of surgery with A SIP OF WATER :Xanax, pain pill if needed, gabapentin, proair inhaler if needed, zantac, symbicort Do not take your aspirin and ibuprofen (or other Nsaids), or any vitamin or herbal supplements.  How to Manage Your Diabetes Before and After Surgery  Why is it important to control my blood sugar before and after surgery? . Improving blood sugar levels before and after surgery helps healing and can limit problems. . A way of improving blood sugar control is eating a healthy diet by: o  Eating less sugar and carbohydrates o  Increasing activity/exercise o  Talking with your doctor about reaching your blood sugar goals . High blood sugars (greater than 180 mg/dL) can raise your risk of infections and slow your recovery, so you will need to focus on controlling your diabetes during the weeks before surgery. . Make sure that the doctor who takes care of your diabetes knows about your planned surgery including the date and location.  How do I manage my blood sugar before surgery? . Check your blood sugar at least 4 times a day, starting 2 days before surgery, to make sure that the level is not too high or low. o Check your blood sugar the morning of your surgery when you wake up and every 2 hours until you get to the Short Stay unit. . If your blood sugar is less than 70 mg/dL, you will need to treat for low blood sugar: o Do not take insulin. o Treat a low blood sugar  (less than 70 mg/dL) with  cup of clear juice (cranberry or apple), 4 glucose tablets, OR glucose gel. o Recheck blood sugar in 15 minutes after treatment (to make sure it is greater than 70 mg/dL). If your blood sugar is not greater than 70 mg/dL on recheck, call [phone] for further instructions. . Report your blood sugar to the short stay nurse when you get to Short Stay.  . If you are admitted to the hospital after surgery: o Your blood sugar will be checked by the staff and you will probably be given insulin after surgery (instead of oral diabetes medicines) to make sure you have good blood sugar levels. o The goal for blood sugar control after surgery is 80-180 mg/dL.    WHAT DO I DO ABOUT MY DIABETES MEDICATION?   Marland Kitchen Do not take oral diabetes medicines (pills) the morning of surgery.  . THE NIGHT BEFORE SURGERY, take 17 units of  Lantus insulin.       . THE MORNING OF SURGERY, take 17 units of insulin.  The day of surgery, do not take other diabetes injectables, including Victoza (liraglutide)    Do not wear jewelry, make-up or nail polish.  Do not wear lotions, powders, or perfumes.  You may  NOT wear deodorant.  Do not shave 48 hours prior to surgery.    Do not bring valuables to the  hospital.  [facility] is not responsible for any belongings or valuables.  Contacts, dentures or bridgework may not be worn into surgery.  Leave your suitcase in the car.  After surgery it may be brought to your room.  For patients admitted to the hospital, discharge time will be determined by your treatment team.   [facility] - Preparing for Surgery  Before surgery, you can play an important role.  Because skin is not sterile, your skin needs to be as free of germs as possible.  You can reduce the number of germs on you skin by washing with CHG (chlorahexidine gluconate) soap before surgery.  CHG is an antiseptic cleaner which kills germs and bonds with the skin to continue killing  germs even after washing.  Please DO NOT use if you have an allergy to CHG or antibacterial soaps.  If your skin becomes reddened/irritated stop using the CHG and inform your nurse when you arrive at Short Stay.  Do not shave (including legs and underarms) for at least 48 hours prior to the first CHG shower.  You may shave your face.  Please follow these instructions carefully:   1.  Shower with CHG Soap the night before surgery and the  morning of Surgery.  2.  If you choose to wash your hair, wash your hair first as usual with your  normal shampoo.  3.  After you shampoo, rinse your hair and body thoroughly to remove the  Shampoo.  4.  Use CHG as you would any other liquid soap.  You can apply chg directly to the skin and wash gently with scrungie or a clean washcloth.  5.  Apply the CHG Soap to your body ONLY FROM THE NECK DOWN.    Do not use on open wounds or open sores.  Avoid contact with your eyes, ears, mouth and genitals (private parts).  Wash genitals (private parts)   with your normal soap.  6.  Wash thoroughly, paying special attention to the area where your surgery will be performed.  7.  Thoroughly rinse your body with warm water from the neck down.  8.  DO NOT shower/wash with your normal soap after using and rinsing off   the CHG Soap.  9.  Pat yourself dry with a clean towel.            10.  Wear clean pajamas.            11.  Place clean sheets on your bed the night of your first shower and do not sleep with pets.  Day of Surgery  Do not apply any lotions/deodorants the morning of surgery.  Please wear clean clothes to the hospital/surgery center.  Please read over the following fact sheets that you were given. Surgical site infection, incentive spirometry,blood transfusion

## 2016-04-16 NOTE — Telephone Encounter (Signed)
Last office visit 04/13/2016.  Last refilled 02/21/2016 for #90 with no refills.  Ok to refill?

## 2016-04-16 NOTE — Telephone Encounter (Signed)
Alprazolam called in to [facility] on [address]. 

## 2016-04-17 ENCOUNTER — Other Ambulatory Visit (HOSPITAL_COMMUNITY): Payer: Self-pay | Admitting: *Deleted

## 2016-04-17 ENCOUNTER — Ambulatory Visit (HOSPITAL_COMMUNITY)
Admission: RE | Admit: 2016-04-17 | Discharge: 2016-04-17 | Disposition: A | Discharge status: Home / Self Care | Payer: Medicare Other | Source: Ambulatory Visit | Attending: Orthopedic Surgery | Admitting: Orthopedic Surgery

## 2016-04-17 ENCOUNTER — Encounter (HOSPITAL_COMMUNITY): Payer: Self-pay

## 2016-04-17 ENCOUNTER — Encounter (HOSPITAL_COMMUNITY)
Admission: RE | Admit: 2016-04-17 | Discharge: 2016-04-17 | Disposition: A | Discharge status: Home / Self Care | Payer: Medicare Other | Source: Ambulatory Visit | Attending: Orthopedic Surgery | Admitting: Orthopedic Surgery

## 2016-04-17 DIAGNOSIS — K219 Gastro-esophageal reflux disease without esophagitis: Secondary | ICD-10-CM | POA: Diagnosis not present

## 2016-04-17 DIAGNOSIS — R9431 Abnormal electrocardiogram [ECG] [EKG]: Secondary | ICD-10-CM | POA: Insufficient documentation

## 2016-04-17 DIAGNOSIS — Z880 Allergy status to penicillin: Secondary | ICD-10-CM | POA: Diagnosis not present

## 2016-04-17 DIAGNOSIS — F411 Generalized anxiety disorder: Secondary | ICD-10-CM | POA: Diagnosis not present

## 2016-04-17 DIAGNOSIS — M797 Fibromyalgia: Secondary | ICD-10-CM | POA: Diagnosis not present

## 2016-04-17 DIAGNOSIS — Z01812 Encounter for preprocedural laboratory examination: Secondary | ICD-10-CM | POA: Insufficient documentation

## 2016-04-17 DIAGNOSIS — Z981 Arthrodesis status: Secondary | ICD-10-CM | POA: Diagnosis not present

## 2016-04-17 DIAGNOSIS — E119 Type 2 diabetes mellitus without complications: Secondary | ICD-10-CM | POA: Diagnosis not present

## 2016-04-17 DIAGNOSIS — I10 Essential (primary) hypertension: Secondary | ICD-10-CM | POA: Diagnosis not present

## 2016-04-17 DIAGNOSIS — Z886 Allergy status to analgesic agent status: Secondary | ICD-10-CM | POA: Diagnosis not present

## 2016-04-17 DIAGNOSIS — Z01818 Encounter for other preprocedural examination: Secondary | ICD-10-CM

## 2016-04-17 DIAGNOSIS — E785 Hyperlipidemia, unspecified: Secondary | ICD-10-CM | POA: Diagnosis not present

## 2016-04-17 DIAGNOSIS — I517 Cardiomegaly: Secondary | ICD-10-CM | POA: Insufficient documentation

## 2016-04-17 DIAGNOSIS — Z7951 Long term (current) use of inhaled steroids: Secondary | ICD-10-CM | POA: Diagnosis not present

## 2016-04-17 DIAGNOSIS — Z794 Long term (current) use of insulin: Secondary | ICD-10-CM | POA: Diagnosis not present

## 2016-04-17 DIAGNOSIS — J449 Chronic obstructive pulmonary disease, unspecified: Secondary | ICD-10-CM | POA: Diagnosis not present

## 2016-04-17 DIAGNOSIS — Z888 Allergy status to other drugs, medicaments and biological substances status: Secondary | ICD-10-CM | POA: Diagnosis not present

## 2016-04-17 DIAGNOSIS — Z01810 Encounter for preprocedural cardiovascular examination: Secondary | ICD-10-CM

## 2016-04-17 DIAGNOSIS — F329 Major depressive disorder, single episode, unspecified: Secondary | ICD-10-CM | POA: Diagnosis not present

## 2016-04-17 DIAGNOSIS — Z79899 Other long term (current) drug therapy: Secondary | ICD-10-CM | POA: Diagnosis not present

## 2016-04-17 DIAGNOSIS — M4806 Spinal stenosis, lumbar region: Secondary | ICD-10-CM | POA: Diagnosis not present

## 2016-04-17 LAB — COMPREHENSIVE METABOLIC PANEL
ALT: 21 U/L (ref 14–54)
ANION GAP: 11 (ref 5–15)
AST: 28 U/L (ref 15–41)
Albumin: 3.4 g/dL — ABNORMAL LOW (ref 3.5–5.0)
Alkaline Phosphatase: 114 U/L (ref 38–126)
BILIRUBIN TOTAL: 0.6 mg/dL (ref 0.3–1.2)
BUN: 15 mg/dL (ref 6–20)
CHLORIDE: 100 mmol/L — AB (ref 101–111)
CO2: 26 mmol/L (ref 22–32)
Calcium: 9.2 mg/dL (ref 8.9–10.3)
Creatinine, Ser: 0.75 mg/dL (ref 0.44–1.00)
Glucose, Bld: 159 mg/dL — ABNORMAL HIGH (ref 65–99)
POTASSIUM: 3.1 mmol/L — AB (ref 3.5–5.1)
Sodium: 137 mmol/L (ref 135–145)
TOTAL PROTEIN: 7.1 g/dL (ref 6.5–8.1)

## 2016-04-17 LAB — CBC WITH DIFFERENTIAL/PLATELET
BASOS ABS: 0 10*3/uL (ref 0.0–0.1)
BASOS PCT: 0 %
Eosinophils Absolute: 0.2 10*3/uL (ref 0.0–0.7)
Eosinophils Relative: 2 %
HEMATOCRIT: 41.1 % (ref 36.0–46.0)
Hemoglobin: 13.2 g/dL (ref 12.0–15.0)
LYMPHS PCT: 42 %
Lymphs Abs: 4.2 10*3/uL — ABNORMAL HIGH (ref 0.7–4.0)
MCH: 28.9 pg (ref 26.0–34.0)
MCHC: 32.1 g/dL (ref 30.0–36.0)
MCV: 89.9 fL (ref 78.0–100.0)
Monocytes Absolute: 0.8 10*3/uL (ref 0.1–1.0)
Monocytes Relative: 8 %
NEUTROS ABS: 4.8 10*3/uL (ref 1.7–7.7)
NEUTROS PCT: 48 %
PLATELETS: 379 10*3/uL (ref 150–400)
RBC: 4.57 MIL/uL (ref 3.87–5.11)
RDW: 13.2 % (ref 11.5–15.5)
WBC: 10.1 10*3/uL (ref 4.0–10.5)

## 2016-04-17 LAB — PROTIME-INR
INR: 1.07 (ref 0.00–1.49)
PROTHROMBIN TIME: 14.1 s (ref 11.6–15.2)

## 2016-04-17 LAB — URINALYSIS, ROUTINE W REFLEX MICROSCOPIC
BILIRUBIN URINE: NEGATIVE
GLUCOSE, UA: NEGATIVE mg/dL
Hgb urine dipstick: NEGATIVE
Ketones, ur: NEGATIVE mg/dL
Leukocytes, UA: NEGATIVE
NITRITE: NEGATIVE
PH: 6 (ref 5.0–8.0)
Protein, ur: NEGATIVE mg/dL
SPECIFIC GRAVITY, URINE: 1.025 (ref 1.005–1.030)

## 2016-04-17 LAB — SURGICAL PCR SCREEN
MRSA, PCR: NEGATIVE
STAPHYLOCOCCUS AUREUS: NEGATIVE

## 2016-04-17 LAB — GLUCOSE, CAPILLARY: GLUCOSE-CAPILLARY: 204 mg/dL — AB (ref 65–99)

## 2016-04-17 LAB — APTT: APTT: 61 s — AB (ref 24–37)

## 2016-04-17 MED ORDER — VANCOMYCIN HCL 10 G IV SOLR
1500.0000 mg | INTRAVENOUS | Status: AC
  Administered 2016-04-18: 1500 mg via INTRAVENOUS
  Filled 2016-04-17: qty 1500

## 2016-04-17 NOTE — Progress Notes (Addendum)
Pt sees Dr Eliezer Lofts with [place] with office and requested last OV note and ECHO from 2012. Office states no other heart studies.   EKG and CXR to be done today.    Pt states that since she has started Victoza she has not needed as much Lantus insulin and only uses it prn.  She has been instructed to monitor insulin today and in the morning and take only 50% of her usually dose (17 units) if she feels she needs it. She states understanding of these instructions and is able to relay back this information.

## 2016-04-17 NOTE — Progress Notes (Signed)
Anesthesia Chart Review: Patient is a 55 year old female scheduled for left sided L4-5 transforaminal lumbar interbody fusion on 04/18/16 by Dr. Lynann Bologna. PAT was this morning.  History includes smoking, asthma, DM2, GERD, murmur (trivial MR/TR '12), fibromyalgia, HTN, OSA without CPAP use, HLD, depression, anxiety, cholecystectomy, appendectomy, C5-7 ACDF 3//16/16. BMI is consistent with morbid obesity.   PCP is Dr. Eliezer Lofts, last visit 04/13/16. She wrote, "Moderate risk pt with moderate to high risk surgery. Pt's comorbidities moderately controlled. Clear for surgery.".Patient is not routinely followed by cardiology, but did see Dr. Johnsie Cancel in 2012 for atypical chest pain and had a normal stress and echo.  Meds include Aimsco insulin, Xanax, Elavil, ASA 81 mg (on hold), HCTZ, gabapentin, Lopid, Lantus, metformin, Actos, Victoza, Symbicort, Zantac, ProAir, Robaxin.  VITALS: HR 100, BP 152/69, RR 20, O2 sat 100%, T 36.1C. CBG 204.   04/17/16 EKG: NSR, possible LAE, LAD, septal infarct (age undetermined). No significant change since last tracings on 01/12/15 and 02/01/11.  03/01/11 Echo:  - Left ventricle: The cavity size was normal. Wall thickness was increased in a pattern of mild LVH. Systolic function was normal. The estimated ejection fraction was in the range of 60% to 65%. Wall motion was normal; there were no regional wall motion abnormalities. Doppler parameters are consistent with abnormal left ventricular relaxation (grade 1 diastolic dysfunction). - Left atrium: The atrium was mildly dilated. - Atrial septum: No defect or patent foramen ovale was identified. - Trivial mitral and tricuspid regurgitation.  03/01/11 Nuclear stress test: Overall Impression: Normal stress nuclear study. No evidence of ischemia. Normal LV function. EF 80%.  04/17/16 CXR: IMPRESSION: No acute cardiopulmonary disease.  Preoperative labs noted. K 3.1. Cr 0.75. Glucose 159. CBC WNL. PT/INR  14.1/1.07. PTT 61 (interestingly her PTT were also elevated on 01/12/15 and 01/19/15 at 57). A1c pending (was 7.8 on 03/19/16). I reviewed elevated PTT (current and history of; had ACDF 01/2015 with "minimal EBL" documented) with anesthesiologist Dr. Therisa Doyne. Recommend to notify Dr. Lynann Bologna of elevated PTT. If he is comfortable with proceeding then anticipate that she can proceed as planned, but will order for T&C (order entered). I also notified Carla at Dr. Laurena Bering office of PT/PTT results. She will review with Dr. Lynann Bologna.  [facility] Short Stay Center/Anesthesiology Phone [phone] 04/17/2016 2:25 PM

## 2016-04-18 ENCOUNTER — Encounter (HOSPITAL_COMMUNITY): Payer: Self-pay | Admitting: *Deleted

## 2016-04-18 ENCOUNTER — Encounter: Payer: Self-pay | Admitting: Family Medicine

## 2016-04-18 ENCOUNTER — Inpatient Hospital Stay (HOSPITAL_COMMUNITY)
Admission: RE | Admit: 2016-04-18 | Discharge: 2016-04-19 | DRG: 460 | Disposition: A | Discharge status: Home Health | Payer: Medicare Other | Source: Ambulatory Visit | Attending: Orthopedic Surgery | Admitting: Orthopedic Surgery

## 2016-04-18 ENCOUNTER — Inpatient Hospital Stay (HOSPITAL_COMMUNITY): Payer: Medicare Other

## 2016-04-18 ENCOUNTER — Inpatient Hospital Stay (HOSPITAL_COMMUNITY): Payer: Medicare Other | Admitting: Vascular Surgery

## 2016-04-18 ENCOUNTER — Encounter (HOSPITAL_COMMUNITY)
Admission: RE | Disposition: A | Discharge status: Home Health | Payer: Self-pay | Source: Ambulatory Visit | Attending: Orthopedic Surgery

## 2016-04-18 ENCOUNTER — Inpatient Hospital Stay (HOSPITAL_COMMUNITY): Payer: Medicare Other | Admitting: Anesthesiology

## 2016-04-18 DIAGNOSIS — I10 Essential (primary) hypertension: Secondary | ICD-10-CM | POA: Diagnosis not present

## 2016-04-18 DIAGNOSIS — Z01810 Encounter for preprocedural cardiovascular examination: Secondary | ICD-10-CM | POA: Diagnosis not present

## 2016-04-18 DIAGNOSIS — F17210 Nicotine dependence, cigarettes, uncomplicated: Secondary | ICD-10-CM | POA: Diagnosis present

## 2016-04-18 DIAGNOSIS — F411 Generalized anxiety disorder: Secondary | ICD-10-CM | POA: Diagnosis present

## 2016-04-18 DIAGNOSIS — Z880 Allergy status to penicillin: Secondary | ICD-10-CM

## 2016-04-18 DIAGNOSIS — Z419 Encounter for procedure for purposes other than remedying health state, unspecified: Secondary | ICD-10-CM

## 2016-04-18 DIAGNOSIS — M797 Fibromyalgia: Secondary | ICD-10-CM | POA: Diagnosis not present

## 2016-04-18 DIAGNOSIS — G9519 Other vascular myelopathies: Secondary | ICD-10-CM | POA: Diagnosis present

## 2016-04-18 DIAGNOSIS — K219 Gastro-esophageal reflux disease without esophagitis: Secondary | ICD-10-CM | POA: Diagnosis present

## 2016-04-18 DIAGNOSIS — Z886 Allergy status to analgesic agent status: Secondary | ICD-10-CM | POA: Diagnosis not present

## 2016-04-18 DIAGNOSIS — M48062 Spinal stenosis, lumbar region with neurogenic claudication: Secondary | ICD-10-CM | POA: Diagnosis present

## 2016-04-18 DIAGNOSIS — J449 Chronic obstructive pulmonary disease, unspecified: Secondary | ICD-10-CM | POA: Diagnosis present

## 2016-04-18 DIAGNOSIS — Z981 Arthrodesis status: Secondary | ICD-10-CM | POA: Diagnosis not present

## 2016-04-18 DIAGNOSIS — E785 Hyperlipidemia, unspecified: Secondary | ICD-10-CM | POA: Diagnosis present

## 2016-04-18 DIAGNOSIS — Z794 Long term (current) use of insulin: Secondary | ICD-10-CM

## 2016-04-18 DIAGNOSIS — F329 Major depressive disorder, single episode, unspecified: Secondary | ICD-10-CM | POA: Diagnosis present

## 2016-04-18 DIAGNOSIS — M4316 Spondylolisthesis, lumbar region: Secondary | ICD-10-CM | POA: Diagnosis not present

## 2016-04-18 DIAGNOSIS — M4806 Spinal stenosis, lumbar region: Principal | ICD-10-CM | POA: Diagnosis present

## 2016-04-18 DIAGNOSIS — Z888 Allergy status to other drugs, medicaments and biological substances status: Secondary | ICD-10-CM | POA: Diagnosis not present

## 2016-04-18 DIAGNOSIS — Z01818 Encounter for other preprocedural examination: Secondary | ICD-10-CM

## 2016-04-18 DIAGNOSIS — Z79899 Other long term (current) drug therapy: Secondary | ICD-10-CM | POA: Diagnosis not present

## 2016-04-18 DIAGNOSIS — Z01812 Encounter for preprocedural laboratory examination: Secondary | ICD-10-CM | POA: Diagnosis not present

## 2016-04-18 DIAGNOSIS — Z7951 Long term (current) use of inhaled steroids: Secondary | ICD-10-CM

## 2016-04-18 DIAGNOSIS — G5601 Carpal tunnel syndrome, right upper limb: Secondary | ICD-10-CM | POA: Diagnosis not present

## 2016-04-18 DIAGNOSIS — M549 Dorsalgia, unspecified: Secondary | ICD-10-CM | POA: Diagnosis not present

## 2016-04-18 DIAGNOSIS — E119 Type 2 diabetes mellitus without complications: Secondary | ICD-10-CM | POA: Diagnosis present

## 2016-04-18 DIAGNOSIS — M79604 Pain in right leg: Secondary | ICD-10-CM | POA: Diagnosis present

## 2016-04-18 LAB — PREPARE RBC (CROSSMATCH)

## 2016-04-18 LAB — GLUCOSE, CAPILLARY
GLUCOSE-CAPILLARY: 133 mg/dL — AB (ref 65–99)
GLUCOSE-CAPILLARY: 92 mg/dL (ref 65–99)
Glucose-Capillary: 163 mg/dL — ABNORMAL HIGH (ref 65–99)
Glucose-Capillary: 125 mg/dL — ABNORMAL HIGH (ref 65–99)

## 2016-04-18 LAB — HEMOGLOBIN A1C
Hgb A1c MFr Bld: 7.3 % — ABNORMAL HIGH (ref 4.8–5.6)
Mean Plasma Glucose: 163 mg/dL

## 2016-04-18 SURGERY — POSTERIOR LUMBAR FUSION 1 LEVEL
Anesthesia: General | Laterality: Left

## 2016-04-18 MED ORDER — FLEET ENEMA 7-19 GM/118ML RE ENEM: 1.0000 | ENEMA | Freq: Once | RECTAL | Status: DC | PRN

## 2016-04-18 MED ORDER — SODIUM CHLORIDE 0.9% FLUSH: 3.0000 mL | INTRAVENOUS | Status: DC | PRN

## 2016-04-18 MED ORDER — ZOLPIDEM TARTRATE 5 MG PO TABS: 5.0000 mg | ORAL_TABLET | Freq: Every evening | ORAL | Status: DC | PRN

## 2016-04-18 MED ORDER — PROPOFOL 1000 MG/100ML IV EMUL
INTRAVENOUS | Status: AC
  Filled 2016-04-18: qty 200

## 2016-04-18 MED ORDER — FENTANYL CITRATE (PF) 250 MCG/5ML IJ SOLN
INTRAMUSCULAR | Status: AC
  Filled 2016-04-18: qty 5

## 2016-04-18 MED ORDER — FENTANYL CITRATE (PF) 100 MCG/2ML IJ SOLN
INTRAMUSCULAR | Status: AC
  Filled 2016-04-18: qty 2

## 2016-04-18 MED ORDER — SODIUM CHLORIDE 0.9 % IV SOLN
INTRAVENOUS | Status: DC
Start: 2016-04-18 — End: 2016-04-19

## 2016-04-18 MED ORDER — PHENYLEPHRINE HCL 10 MG/ML IJ SOLN
10.0000 mg | INTRAVENOUS | Status: DC | PRN
  Administered 2016-04-18: 25 ug/min via INTRAVENOUS

## 2016-04-18 MED ORDER — GLYCOPYRROLATE 0.2 MG/ML IV SOSY
PREFILLED_SYRINGE | INTRAVENOUS | Status: AC
  Filled 2016-04-18: qty 3

## 2016-04-18 MED ORDER — FENTANYL CITRATE (PF) 250 MCG/5ML IJ SOLN
INTRAMUSCULAR | Status: DC | PRN
  Administered 2016-04-18: 50 ug via INTRAVENOUS
  Administered 2016-04-18: 100 ug via INTRAVENOUS
  Administered 2016-04-18 (×5): 50 ug via INTRAVENOUS
  Administered 2016-04-18: 100 ug via INTRAVENOUS
  Administered 2016-04-18: 150 ug via INTRAVENOUS
  Administered 2016-04-18: 100 ug via INTRAVENOUS

## 2016-04-18 MED ORDER — AMITRIPTYLINE HCL 50 MG PO TABS
50.0000 mg | ORAL_TABLET | Freq: Every day | ORAL | Status: DC
  Administered 2016-04-18: 50 mg via ORAL
  Filled 2016-04-18: qty 1

## 2016-04-18 MED ORDER — GLYCOPYRROLATE 0.2 MG/ML IJ SOLN
INTRAMUSCULAR | Status: DC | PRN
  Administered 2016-04-18 (×2): 0.1 mg via INTRAVENOUS

## 2016-04-18 MED ORDER — SODIUM CHLORIDE 0.9% FLUSH: 3.0000 mL | Freq: Two times a day (BID) | INTRAVENOUS | Status: DC

## 2016-04-18 MED ORDER — SENNOSIDES-DOCUSATE SODIUM 8.6-50 MG PO TABS: 1.0000 | ORAL_TABLET | Freq: Every evening | ORAL | Status: DC | PRN

## 2016-04-18 MED ORDER — OXYCODONE-ACETAMINOPHEN 5-325 MG PO TABS
1.0000 | ORAL_TABLET | ORAL | Status: DC | PRN
  Administered 2016-04-18 – 2016-04-19 (×3): 2 via ORAL
  Filled 2016-04-18 (×3): qty 2

## 2016-04-18 MED ORDER — PIOGLITAZONE HCL 15 MG PO TABS
45.0000 mg | ORAL_TABLET | Freq: Every day | ORAL | Status: DC
Start: 2016-04-19 — End: 2016-04-19

## 2016-04-18 MED ORDER — FAMOTIDINE 20 MG PO TABS: 20.0000 mg | ORAL_TABLET | Freq: Every day | ORAL | Status: DC

## 2016-04-18 MED ORDER — PROPOFOL 500 MG/50ML IV EMUL
INTRAVENOUS | Status: DC | PRN
  Administered 2016-04-18: 75 ug/kg/min via INTRAVENOUS

## 2016-04-18 MED ORDER — MIDAZOLAM HCL 2 MG/2ML IJ SOLN
INTRAMUSCULAR | Status: AC
  Filled 2016-04-18: qty 2

## 2016-04-18 MED ORDER — MORPHINE SULFATE (PF) 2 MG/ML IV SOLN: 1.0000 mg | INTRAVENOUS | Status: DC | PRN

## 2016-04-18 MED ORDER — LIDOCAINE 2% (20 MG/ML) 5 ML SYRINGE
INTRAMUSCULAR | Status: AC
  Filled 2016-04-18: qty 5

## 2016-04-18 MED ORDER — HYDROMORPHONE HCL 1 MG/ML IJ SOLN
0.2500 mg | INTRAMUSCULAR | Status: DC | PRN
  Administered 2016-04-18 (×2): 0.5 mg via INTRAVENOUS

## 2016-04-18 MED ORDER — ROCURONIUM BROMIDE 50 MG/5ML IV SOLN
INTRAVENOUS | Status: AC
  Filled 2016-04-18: qty 1

## 2016-04-18 MED ORDER — SUCCINYLCHOLINE CHLORIDE 20 MG/ML IJ SOLN
INTRAMUSCULAR | Status: DC | PRN
  Administered 2016-04-18: 100 mg via INTRAVENOUS

## 2016-04-18 MED ORDER — MIDAZOLAM HCL 2 MG/2ML IJ SOLN
INTRAMUSCULAR | Status: DC | PRN
  Administered 2016-04-18 (×2): 1 mg via INTRAVENOUS

## 2016-04-18 MED ORDER — METHYLENE BLUE 0.5 % INJ SOLN
INTRAVENOUS | Status: AC
  Filled 2016-04-18: qty 10

## 2016-04-18 MED ORDER — ALUM & MAG HYDROXIDE-SIMETH 200-200-20 MG/5ML PO SUSP: 30.0000 mL | Freq: Four times a day (QID) | ORAL | Status: DC | PRN

## 2016-04-18 MED ORDER — SUCCINYLCHOLINE CHLORIDE 200 MG/10ML IV SOSY
PREFILLED_SYRINGE | INTRAVENOUS | Status: AC
  Filled 2016-04-18: qty 10

## 2016-04-18 MED ORDER — SODIUM CHLORIDE 0.9 % IV SOLN
0.0200 mg | INTRAVENOUS | Status: DC | PRN
  Administered 2016-04-18: .02 mg via INTRAVENOUS

## 2016-04-18 MED ORDER — HYDROMORPHONE HCL 1 MG/ML IJ SOLN
INTRAMUSCULAR | Status: AC
  Filled 2016-04-18: qty 1

## 2016-04-18 MED ORDER — PROPOFOL 10 MG/ML IV BOLUS
INTRAVENOUS | Status: DC | PRN
  Administered 2016-04-18: 150 mg via INTRAVENOUS
  Administered 2016-04-18: 50 mg via INTRAVENOUS

## 2016-04-18 MED ORDER — PROPOFOL 10 MG/ML IV BOLUS
INTRAVENOUS | Status: AC
  Filled 2016-04-18: qty 20

## 2016-04-18 MED ORDER — ROCURONIUM BROMIDE 100 MG/10ML IV SOLN
INTRAVENOUS | Status: DC | PRN
  Administered 2016-04-18: 50 mg via INTRAVENOUS

## 2016-04-18 MED ORDER — METFORMIN HCL 500 MG PO TABS
1000.0000 mg | ORAL_TABLET | Freq: Two times a day (BID) | ORAL | Status: DC
  Administered 2016-04-19: 1000 mg via ORAL
  Filled 2016-04-18: qty 2

## 2016-04-18 MED ORDER — ARTIFICIAL TEARS OP OINT
TOPICAL_OINTMENT | OPHTHALMIC | Status: AC
  Filled 2016-04-18: qty 3.5

## 2016-04-18 MED ORDER — ALPRAZOLAM 0.5 MG PO TABS
0.5000 mg | ORAL_TABLET | Freq: Three times a day (TID) | ORAL | Status: DC
  Administered 2016-04-18: 0.5 mg via ORAL
  Filled 2016-04-18: qty 1

## 2016-04-18 MED ORDER — BISACODYL 5 MG PO TBEC: 5.0000 mg | DELAYED_RELEASE_TABLET | Freq: Every day | ORAL | Status: DC | PRN

## 2016-04-18 MED ORDER — FENTANYL CITRATE (PF) 100 MCG/2ML IJ SOLN
50.0000 ug | Freq: Once | INTRAMUSCULAR | Status: AC
  Administered 2016-04-18: 50 ug via INTRAVENOUS
  Filled 2016-04-18: qty 1

## 2016-04-18 MED ORDER — INSULIN GLARGINE 100 UNIT/ML ~~LOC~~ SOLN
35.0000 [IU] | Freq: Every evening | SUBCUTANEOUS | Status: DC | PRN
  Filled 2016-04-18: qty 0.35

## 2016-04-18 MED ORDER — GABAPENTIN 300 MG PO CAPS
300.0000 mg | ORAL_CAPSULE | Freq: Three times a day (TID) | ORAL | Status: DC
  Administered 2016-04-18: 300 mg via ORAL
  Filled 2016-04-18: qty 1

## 2016-04-18 MED ORDER — THROMBIN 20000 UNITS EX SOLR
CUTANEOUS | Status: AC
  Filled 2016-04-18: qty 20000

## 2016-04-18 MED ORDER — POVIDONE-IODINE 7.5 % EX SOLN: Freq: Once | CUTANEOUS | Status: DC

## 2016-04-18 MED ORDER — ONDANSETRON HCL 4 MG/2ML IJ SOLN
INTRAMUSCULAR | Status: AC
  Filled 2016-04-18: qty 2

## 2016-04-18 MED ORDER — BUPIVACAINE-EPINEPHRINE (PF) 0.25% -1:200000 IJ SOLN
INTRAMUSCULAR | Status: AC
  Filled 2016-04-18: qty 30

## 2016-04-18 MED ORDER — ONDANSETRON HCL 4 MG/2ML IJ SOLN
INTRAMUSCULAR | Status: DC | PRN
  Administered 2016-04-18 (×2): 4 mg via INTRAVENOUS

## 2016-04-18 MED ORDER — LIRAGLUTIDE 18 MG/3ML ~~LOC~~ SOPN: 1.8000 mg | PEN_INJECTOR | Freq: Every morning | SUBCUTANEOUS | Status: DC

## 2016-04-18 MED ORDER — VANCOMYCIN HCL 10 G IV SOLR
1250.0000 mg | Freq: Once | INTRAVENOUS | Status: AC
  Administered 2016-04-19: 1250 mg via INTRAVENOUS
  Filled 2016-04-18: qty 1250

## 2016-04-18 MED ORDER — ACETAMINOPHEN 650 MG RE SUPP: 650.0000 mg | RECTAL | Status: DC | PRN

## 2016-04-18 MED ORDER — ARTIFICIAL TEARS OP OINT
TOPICAL_OINTMENT | OPHTHALMIC | Status: DC | PRN
Start: 2016-04-18 — End: 2016-04-18
  Administered 2016-04-18: 1 via OPHTHALMIC

## 2016-04-18 MED ORDER — GEMFIBROZIL 600 MG PO TABS
600.0000 mg | ORAL_TABLET | Freq: Two times a day (BID) | ORAL | Status: DC
Start: 2016-04-18 — End: 2016-04-19
  Administered 2016-04-18: 600 mg via ORAL
  Filled 2016-04-18 (×2): qty 1

## 2016-04-18 MED ORDER — ACETAMINOPHEN 325 MG PO TABS: 650.0000 mg | ORAL_TABLET | ORAL | Status: DC | PRN

## 2016-04-18 MED ORDER — ALBUTEROL SULFATE (2.5 MG/3ML) 0.083% IN NEBU: 3.0000 mL | INHALATION_SOLUTION | RESPIRATORY_TRACT | Status: DC | PRN

## 2016-04-18 MED ORDER — 0.9 % SODIUM CHLORIDE (POUR BTL) OPTIME
TOPICAL | Status: DC | PRN
  Administered 2016-04-18 (×2): 1000 mL

## 2016-04-18 MED ORDER — ONDANSETRON HCL 4 MG/2ML IJ SOLN: 4.0000 mg | INTRAMUSCULAR | Status: DC | PRN

## 2016-04-18 MED ORDER — MENTHOL 3 MG MT LOZG: 1.0000 | LOZENGE | OROMUCOSAL | Status: DC | PRN

## 2016-04-18 MED ORDER — SODIUM CHLORIDE 0.9 % IV SOLN: 250.0000 mL | INTRAVENOUS | Status: DC

## 2016-04-18 MED ORDER — EPHEDRINE SULFATE 50 MG/ML IJ SOLN
INTRAMUSCULAR | Status: DC | PRN
  Administered 2016-04-18: 10 mg via INTRAVENOUS
  Administered 2016-04-18: 15 mg via INTRAVENOUS

## 2016-04-18 MED ORDER — DIAZEPAM 5 MG PO TABS: 5.0000 mg | ORAL_TABLET | Freq: Four times a day (QID) | ORAL | Status: DC | PRN

## 2016-04-18 MED ORDER — LACTATED RINGERS IV SOLN
INTRAVENOUS | Status: DC
  Administered 2016-04-18 (×3): via INTRAVENOUS

## 2016-04-18 MED ORDER — LIDOCAINE HCL (CARDIAC) 20 MG/ML IV SOLN
INTRAVENOUS | Status: DC | PRN
  Administered 2016-04-18: 60 mg via INTRATRACHEAL

## 2016-04-18 MED ORDER — MOMETASONE FURO-FORMOTEROL FUM 100-5 MCG/ACT IN AERO
2.0000 | INHALATION_SPRAY | Freq: Two times a day (BID) | RESPIRATORY_TRACT | Status: DC
  Administered 2016-04-18 – 2016-04-19 (×2): 2 via RESPIRATORY_TRACT
  Filled 2016-04-18: qty 8.8

## 2016-04-18 MED ORDER — BUPIVACAINE-EPINEPHRINE 0.25% -1:200000 IJ SOLN
INTRAMUSCULAR | Status: DC | PRN
  Administered 2016-04-18: 20 mL

## 2016-04-18 MED ORDER — HYDROCHLOROTHIAZIDE 25 MG PO TABS: 25.0000 mg | ORAL_TABLET | Freq: Every day | ORAL | Status: DC

## 2016-04-18 MED ORDER — DOCUSATE SODIUM 100 MG PO CAPS
100.0000 mg | ORAL_CAPSULE | Freq: Two times a day (BID) | ORAL | Status: DC
  Administered 2016-04-18: 100 mg via ORAL
  Filled 2016-04-18: qty 1

## 2016-04-18 MED ORDER — PHENYLEPHRINE HCL 10 MG/ML IJ SOLN
INTRAMUSCULAR | Status: DC | PRN
  Administered 2016-04-18 (×2): 80 ug via INTRAVENOUS

## 2016-04-18 MED ORDER — PHENOL 1.4 % MT LIQD
1.0000 | OROMUCOSAL | Status: DC | PRN
Start: 2016-04-18 — End: 2016-04-19

## 2016-04-18 SURGICAL SUPPLY — 90 items
APL SKNCLS STERI-STRIP NONHPOA (GAUZE/BANDAGES/DRESSINGS) ×1
BAG URINE DRAINAGE (UROLOGICAL SUPPLIES) ×3 IMPLANT
BENZOIN TINCTURE PRP APPL 2/3 (GAUZE/BANDAGES/DRESSINGS) ×3 IMPLANT
BLADE SURG ROTATE 9660 (MISCELLANEOUS) IMPLANT
BUR PRESCISION 1.7 ELITE (BURR) ×6 IMPLANT
BUR ROUND PRECISION 4.0 (BURR) ×2 IMPLANT
BUR ROUND PRECISION 4.0MM (BURR) ×1
BUR SABER RD CUTTING 3.0 (BURR) IMPLANT
BUR SABER RD CUTTING 3.0MM (BURR)
CAGE CONCORDE BULLET 9X11X27 (Cage) ×3 IMPLANT
CAGE SPNL PRLL BLT NOSE 27X9 (Cage) ×1 IMPLANT
CARTRIDGE OIL MAESTRO DRILL (MISCELLANEOUS) ×1 IMPLANT
CLOSURE WOUND 1/2 X4 (GAUZE/BANDAGES/DRESSINGS) ×2
CONT SPEC STER OR (MISCELLANEOUS) ×3 IMPLANT
COVER MAYO STAND STRL (DRAPES) ×6 IMPLANT
COVER SURGICAL LIGHT HANDLE (MISCELLANEOUS) ×3 IMPLANT
DIFFUSER DRILL AIR PNEUMATIC (MISCELLANEOUS) ×3 IMPLANT
DRAIN CHANNEL 15F RND FF W/TCR (WOUND CARE) IMPLANT
DRAPE C-ARM 42X72 X-RAY (DRAPES) ×3 IMPLANT
DRAPE C-ARMOR (DRAPES) IMPLANT
DRAPE POUCH INSTRU U-SHP 10X18 (DRAPES) ×3 IMPLANT
DRAPE SURG 17X23 STRL (DRAPES) ×12 IMPLANT
DURAPREP 26ML APPLICATOR (WOUND CARE) ×3 IMPLANT
ELECT BLADE 4.0 EZ CLEAN MEGAD (MISCELLANEOUS) ×3
ELECT CAUTERY BLADE 6.4 (BLADE) ×6 IMPLANT
ELECT REM PT RETURN 9FT ADLT (ELECTROSURGICAL) ×6
ELECTRODE BLDE 4.0 EZ CLN MEGD (MISCELLANEOUS) ×1 IMPLANT
ELECTRODE REM PT RTRN 9FT ADLT (ELECTROSURGICAL) ×2 IMPLANT
EVACUATOR SILICONE 100CC (DRAIN) IMPLANT
GAUZE SPONGE 4X4 12PLY STRL (GAUZE/BANDAGES/DRESSINGS) ×3 IMPLANT
GAUZE SPONGE 4X4 16PLY XRAY LF (GAUZE/BANDAGES/DRESSINGS) ×12 IMPLANT
GLOVE BIO SURGEON STRL SZ7 (GLOVE) ×3 IMPLANT
GLOVE BIO SURGEON STRL SZ8 (GLOVE) ×3 IMPLANT
GLOVE BIOGEL PI IND STRL 6.5 (GLOVE) ×1 IMPLANT
GLOVE BIOGEL PI IND STRL 7.0 (GLOVE) ×2 IMPLANT
GLOVE BIOGEL PI IND STRL 8 (GLOVE) ×1 IMPLANT
GLOVE BIOGEL PI INDICATOR 6.5 (GLOVE) ×2
GLOVE BIOGEL PI INDICATOR 7.0 (GLOVE) ×4
GLOVE BIOGEL PI INDICATOR 8 (GLOVE) ×2
GLOVE SURG SS PI 6.5 STRL IVOR (GLOVE) ×3 IMPLANT
GLOVE SURG SS PI 7.0 STRL IVOR (GLOVE) ×3 IMPLANT
GOWN STRL REUS W/ TWL LRG LVL3 (GOWN DISPOSABLE) ×5 IMPLANT
GOWN STRL REUS W/ TWL XL LVL3 (GOWN DISPOSABLE) ×1 IMPLANT
GOWN STRL REUS W/TWL LRG LVL3 (GOWN DISPOSABLE) ×15
GOWN STRL REUS W/TWL XL LVL3 (GOWN DISPOSABLE) ×3
IV CATH 14GX2 1/4 (CATHETERS) ×3 IMPLANT
KIT BASIN OR (CUSTOM PROCEDURE TRAY) ×3 IMPLANT
KIT POSITION SURG JACKSON T1 (MISCELLANEOUS) ×3 IMPLANT
KIT ROOM TURNOVER OR (KITS) ×3 IMPLANT
MARKER SKIN DUAL TIP RULER LAB (MISCELLANEOUS) ×3 IMPLANT
MIX DBX 10CC 35% BONE (Bone Implant) ×3 IMPLANT
NDL SAFETY ECLIPSE 18X1.5 (NEEDLE) ×1 IMPLANT
NDL SPNL 18GX3.5 QUINCKE PK (NEEDLE) ×2 IMPLANT
NEEDLE 22X1 1/2 (OR ONLY) (NEEDLE) ×6 IMPLANT
NEEDLE HYPO 18GX1.5 SHARP (NEEDLE) ×3
NEEDLE HYPO 25GX1X1/2 BEV (NEEDLE) ×3 IMPLANT
NEEDLE SPNL 18GX3.5 QUINCKE PK (NEEDLE) ×6 IMPLANT
NS IRRIG 1000ML POUR BTL (IV SOLUTION) ×6 IMPLANT
OIL CARTRIDGE MAESTRO DRILL (MISCELLANEOUS) ×3
PACK LAMINECTOMY ORTHO (CUSTOM PROCEDURE TRAY) ×3 IMPLANT
PACK UNIVERSAL I (CUSTOM PROCEDURE TRAY) ×3 IMPLANT
PAD ARMBOARD 7.5X6 YLW CONV (MISCELLANEOUS) ×6 IMPLANT
PATTIES SURGICAL .5 X1 (DISPOSABLE) ×3 IMPLANT
PATTIES SURGICAL .5X1.5 (GAUZE/BANDAGES/DRESSINGS) ×3 IMPLANT
PENCIL BUTTON BLDE SNGL 10FT (ELECTRODE) ×3 IMPLANT
ROD PRE BENT EXPEDIUM 35MM (Rod) ×6 IMPLANT
SCREW SET SINGLE INNER (Screw) ×12 IMPLANT
SCREW VIPER CORT FIX 6X35 (Screw) ×6 IMPLANT
SCREW VIPER CORTICAL FIX 6X40 (Screw) ×4 IMPLANT
SPONGE INTESTINAL PEANUT (DISPOSABLE) ×3 IMPLANT
SPONGE LAP 18X18 X RAY DECT (DISPOSABLE) ×2 IMPLANT
SPONGE SURGIFOAM ABS GEL 100 (HEMOSTASIS) ×3 IMPLANT
STRIP CLOSURE SKIN 1/2X4 (GAUZE/BANDAGES/DRESSINGS) ×4 IMPLANT
SURGIFLO W/THROMBIN 8M KIT (HEMOSTASIS) IMPLANT
SUT BONE WAX W31G (SUTURE) ×3 IMPLANT
SUT MNCRL AB 4-0 PS2 18 (SUTURE) ×6 IMPLANT
SUT VIC AB 0 CT1 18XCR BRD 8 (SUTURE) ×1 IMPLANT
SUT VIC AB 0 CT1 8-18 (SUTURE) ×3
SUT VIC AB 1 CT1 18XCR BRD 8 (SUTURE) ×2 IMPLANT
SUT VIC AB 1 CT1 8-18 (SUTURE) ×6
SUT VIC AB 2-0 CT2 18 VCP726D (SUTURE) ×3 IMPLANT
SYR 20CC LL (SYRINGE) ×3 IMPLANT
SYR BULB IRRIGATION 50ML (SYRINGE) ×6 IMPLANT
SYR CONTROL 10ML LL (SYRINGE) ×6 IMPLANT
SYR TB 1ML LUER SLIP (SYRINGE) ×3 IMPLANT
TOWEL OR 17X24 6PK STRL BLUE (TOWEL DISPOSABLE) ×3 IMPLANT
TOWEL OR 17X26 10 PK STRL BLUE (TOWEL DISPOSABLE) ×3 IMPLANT
TRAY FOLEY CATH 16FRSI W/METER (SET/KITS/TRAYS/PACK) ×3 IMPLANT
WATER STERILE IRR 1000ML POUR (IV SOLUTION) ×1 IMPLANT
YANKAUER SUCT BULB TIP NO VENT (SUCTIONS) ×3 IMPLANT

## 2016-04-18 NOTE — Anesthesia Procedure Notes (Signed)
Procedure Name: Intubation Date/Time: 04/18/2016 2:34 PM Performed by: Collier Bullock Pre-anesthesia Checklist: Patient identified, Emergency Drugs available, Suction available and Patient being monitored Patient Re-evaluated:Patient Re-evaluated prior to inductionOxygen Delivery Method: Circle system utilized Preoxygenation: Pre-oxygenation with 100% oxygen Intubation Type: IV induction Ventilation: Mask ventilation without difficulty Laryngoscope Size: Mac and 3 Grade View: Grade I Tube type: Oral Tube size: 7.0 mm Number of attempts: 1 Airway Equipment and Method: Stylet Placement Confirmation: ETT inserted through vocal cords under direct vision,  positive ETCO2 and breath sounds checked- equal and bilateral Secured at: 22 cm Tube secured with: Tape Dental Injury: Teeth and Oropharynx as per pre-operative assessment  Future Recommendations: Recommend- induction with short-acting agent, and alternative techniques readily available

## 2016-04-18 NOTE — Anesthesia Postprocedure Evaluation (Signed)
Anesthesia Post Note  Patient: IMAGEAN LAGORIO  Procedure(s) Performed: Procedure(s) (LRB): LEFT SIDED LUMBAR 4-5 TRANSFORAMINAL LUMBAR INTERBODY FUSION WITH INSTRUMENTATION AND ALLOGRAFT (Left)  Patient location during evaluation: PACU Anesthesia Type: General Level of consciousness: awake and alert Pain management: pain level controlled Vital Signs Assessment: post-procedure vital signs reviewed and stable Respiratory status: spontaneous breathing, nonlabored ventilation, respiratory function stable and patient connected to nasal cannula oxygen Cardiovascular status: blood pressure returned to baseline and stable Postop Assessment: no signs of nausea or vomiting Anesthetic complications: no    Last Vitals:  Filed Vitals:   04/18/16 2000 04/18/16 2027  BP: 124/66 96/58  Pulse: 92   Temp:  36.6 C  Resp: 11     Last Pain:  Filed Vitals:   04/18/16 2038  PainSc: 4                  Senta Kantor DAVID

## 2016-04-18 NOTE — Op Note (Signed)
NAMEDELAIAH, PAM NO.:  [id number]  MEDICAL RECORD NO.:  [id number]  LOCATION:  3C11C                        FACILITY:  [facility]  PHYSICIAN:  Phylliss Bob, MD      DATE OF BIRTH:  11/07/1960  DATE OF PROCEDURE:  04/18/2016                              OPERATIVE REPORT   PREOPERATIVE DIAGNOSES: 1. L4-L5 spinal stenosis. 2. Grade 1 L4-L5 spondylolisthesis. 3. Neurogenic claudication.  POSTOPERATIVE DIAGNOSIS: 1. L4-L5 spinal stenosis. 2. Grade 1 L4-L5 spondylolisthesis. 3. Neurogenic claudication.  PROCEDURES: 1. Left-sided L4-L5 transforaminal lumbar interbody fusion. 2. Right-sided L4-L5 posterolateral fusion. 3. L4-L5 decompression, requiring a bilateral partial facetectomy and     significantly more bone removal than that of which is required for     the fusion portion of the procedure. 4. Insertion of interbody device x1 (27 x 11 mm Concorde Bullet     spacer). 5. Placement of posterior instrumentation L4, L5. 6. Use of local autograft. 7. Use of morselized allograft. 8. Intraoperative use of fluoroscopy.  SURGEON:  Phylliss Bob, MD.  ASSISTANTPricilla Holm, PA-C.  ANESTHESIA:  General endotracheal anesthesia.  COMPLICATIONS:  None.  DISPOSITION:  Stable.  ESTIMATED BLOOD LOSS:  100 mL.  INDICATIONS FOR SURGERY:  Briefly, Ms. Obriant is a pleasant 55 year old female who did present to me with severe pain in her bilateral legs. She also had pain in her back.  An MRI did reveal the findings noted above.  I did feel that it was extremely likely that the findings reflected above were correlating to the patient's symptoms.  We therefore did discuss proceeding with the procedure noted above.  The patient was fully aware that the goal of surgery was to address her bilateral leg pain, not to address her back pain.  She did elect to proceed.  OPERATIVE DETAILS:  On April 18, 2016, the patient was brought to surgery and general endotracheal  anesthesia was administered.  The patient was placed prone on a well-padded flat Jackson bed with a spinal frame. Antibiotics were given.  A time-out procedure was performed.  The back was prepped and draped, and a midline incision was made.  The lamina of L4 and L5 were was identified and subperiosteally exposed.  Using anatomic landmarks in addition to AP and lateral fluoroscopy, I did cannulate the L4 and L5 pedicles using a medial to lateral cortical trajectory technique.  On the right side, I did decorticate the posterior elements.  There was substantial facet hypertrophy which was removed.  I then placed L4 and L5 screws of the appropriate diameter and length into the L4 and L5 pedicles.  A 35 mm rod was placed, and distraction was applied across the rod.  Caps were then placed.  On the left side, bone wax was placed in the cannulated pedicle holes.  I then performed a full facetectomy on the left.  I then proceeded with a bilateral lateral recess decompression, there was substantial and significant spinal stenosis, which was addressed with the decompression. Then, with an assistant holding medial retraction of the left L5 nerve, I did perform an annulotomy and proceeded with a thorough and complete L4-L5 intervertebral diskectomy.  The intervertebral space was  then packed with allograft and autograft.  The appropriate size intervertebral implant was then packed with allograft and autograft as well and tamped into position.  I was very pleased with the final press- fit of the implant.  Of note, the wound was copiously irrigated liberally throughout the procedure prior to placing the bone graft. Bone graft was then placed into the right posterolateral gutter. Distraction was then discontinued on the contralateral right side.  I then placed L4 and L5 pedicles on the left.  A 35 mm rod was placed and caps were placed, and a final locking procedure was performed on the right and left  sides.  I was very pleased with the final AP and lateral fluoroscopic images.  Of note, I did use neurologic monitoring throughout the surgery, and there was no abnormal EMG activity noted throughout the entire surgery.  The wound was then closed in layers using #1 Vicryl, followed by 0 Vicryl, followed by 3-0 Monocryl. Benzoin and Steri-Strips were applied, followed by sterile dressing. All instrument counts were correct at the termination of the procedure.  Of note, Pricilla Holm was my assistant throughout the surgery, and did aid in retraction, suctioning, and closure from start to finish.     Phylliss Bob, MD     MD/MEDQ  D:  04/18/2016  T:  04/18/2016  Job:  [id number]

## 2016-04-18 NOTE — Progress Notes (Signed)
Placed on tele to monitor  

## 2016-04-18 NOTE — Anesthesia Preprocedure Evaluation (Signed)
Anesthesia Evaluation  Patient identified by MRN, date of birth, ID band Patient awake    Reviewed: Allergy & Precautions, H&P , NPO status , Patient's Chart, lab work & pertinent test results  Airway Mallampati: II  TM Distance: >3 FB Neck ROM: Full    Dental no notable dental hx. (+) Edentulous Upper, Edentulous Lower, Dental Advisory Given   Pulmonary asthma , COPD,  COPD inhaler, Current Smoker,    Pulmonary exam normal breath sounds clear to auscultation       Cardiovascular hypertension, Pt. on medications  Rhythm:Regular Rate:Normal     Neuro/Psych PSYCHIATRIC DISORDERS Anxiety Depression negative neurological ROS     GI/Hepatic Neg liver ROS, GERD  Medicated and Controlled,  Endo/Other  diabetes, Type 1, Insulin DependentMorbid obesity  Renal/GU negative Renal ROS  negative genitourinary   Musculoskeletal  (+) Arthritis , Osteoarthritis,  Fibromyalgia -  Abdominal   Peds  Hematology negative hematology ROS (+)   Anesthesia Other Findings   Reproductive/Obstetrics negative OB ROS                             Anesthesia Physical Anesthesia Plan  ASA: III  Anesthesia Plan: General   Post-op Pain Management:    Induction: Intravenous  Airway Management Planned: Oral ETT  Additional Equipment:   Intra-op Plan:   Post-operative Plan: Extubation in OR  Informed Consent: I have reviewed the patients History and Physical, chart, labs and discussed the procedure including the risks, benefits and alternatives for the proposed anesthesia with the patient or authorized representative who has indicated his/her understanding and acceptance.   Dental advisory given  Plan Discussed with: CRNA  Anesthesia Plan Comments:         Anesthesia Quick Evaluation

## 2016-04-18 NOTE — Transfer of Care (Signed)
Immediate Anesthesia Transfer of Care Note  Patient: Sandra Ferrell  Procedure(s) Performed: Procedure(s) with comments: LEFT SIDED LUMBAR 4-5 TRANSFORAMINAL LUMBAR INTERBODY FUSION WITH INSTRUMENTATION AND ALLOGRAFT (Left) - LEFT SIDED LUMBAR 4-5 TRANSFORAMINAL LUMBAR INTERBODY FUSION WITH INSTRUMENTATION AND ALLOGRAFT  Patient Location: PACU  Anesthesia Type:General  Level of Consciousness: awake  Airway & Oxygen Therapy: Patient Spontanous Breathing and Patient connected to face mask oxygen  Post-op Assessment: Report given to RN and Post -op Vital signs reviewed and stable  Post vital signs: Reviewed and stable  Last Vitals:  Filed Vitals:   04/18/16 1213 04/18/16 1857  BP: 125/53   Pulse: 90   Temp:  36.6 C  Resp: 18     Last Pain:  Filed Vitals:   04/18/16 1908  PainSc: Asleep      Patients Stated Pain Goal: 5 ([phone])  Complications: No apparent anesthesia complications

## 2016-04-18 NOTE — Progress Notes (Signed)
Pharmacy note: vancomycin   55 yo female s/p spinal surgery and pharmacy consulted to dose vancomycin for surgical prophylaxis (no drain in place). Vancomycin 1500 mg was given at ~ 2:20pm -SCr= 1.07, CrCl ~ 80  Plan -vancomycin 1250mg  IV x1 at 2:30am -Will sign off. Please contact pharmacy with any other needs.  Thank you Hildred Laser, Pharm D 04/18/2016 10:15 PM

## 2016-04-18 NOTE — Progress Notes (Signed)
Pt states she drank 1 16 oz bottle of water today at 0800 and took a sip when she came in today. Dt Fitzgerald informed.

## 2016-04-19 LAB — GLUCOSE, CAPILLARY: GLUCOSE-CAPILLARY: 115 mg/dL — AB (ref 65–99)

## 2016-04-19 MED FILL — Heparin Sodium (Porcine) Inj 1000 Unit/ML: INTRAMUSCULAR | Qty: 30 | Status: AC

## 2016-04-19 MED FILL — Sodium Chloride IV Soln 0.9%: INTRAVENOUS | Qty: 1000 | Status: AC

## 2016-04-19 NOTE — Progress Notes (Signed)
Occupational Therapy Evaluation Patient Details Name: Sandra Ferrell MRN: [id number] DOB: June 30, 1961 Today's Date: 04/19/2016    History of Present Illness Pt is a 55 y/o F s/p Lt sided transforaminal lumbar interbody fusion, Rt sided L4-5 posterolatearl fusion.  Pt's PMH includes anxiety, depression, fibromyalgia, Rt carpal tunnel syndrome, ACDF C5-7.   Clinical Impression   Pt admitted with the above diagnoses and presents with below problem list. Pt will benefit from continued acute OT to address the below listed deficits and maximize independence with BADLs prior to d/c to venue below. PTA pt was independent with ADLs. Pt is currently min A with ADLs, min guard with functional mobility. Spouse present and involved in session.      Follow Up Recommendations  Supervision/Assistance - 24 hour    Equipment Recommendations  3 in 1 bedside comode    Recommendations for Other Services       Precautions / Restrictions Precautions Precautions: Back Precaution Booklet Issued: Yes (comment) Precaution Comments: reviewed BAT back precautions Required Braces or Orthoses: Spinal Brace Spinal Brace: Thoracolumbosacral orthotic;Applied in sitting position Restrictions Weight Bearing Restrictions: No      Mobility Bed Mobility Overal bed mobility: Needs Assistance Bed Mobility: Rolling;Sit to Sidelying Rolling: Min assist Sidelying to sit: Min assist     Sit to sidelying: Min assist General bed mobility comments: assist to maintain precautions, min A to power up legs onto bed  Transfers Overall transfer level: Needs assistance Equipment used: Rolling walker (2 wheeled);None Transfers: Sit to/from Stand Sit to Stand: Min guard;Min assist;From elevated surface         General transfer comment: Min A with cues on first attempt from EOB with bed height at lowest position. Boosted bed height then min guard for safety. Also from 3n1 over toilet.    Balance Overall balance  assessment: Needs assistance Sitting-balance support: No upper extremity supported;Feet supported Sitting balance-Leahy Scale: Fair Sitting balance - Comments: Pt able to sit EOB w/o UE support and eat crackers   Standing balance support: Single extremity supported;Bilateral upper extremity supported Standing balance-Leahy Scale: Poor Standing balance comment: external support for balance                            ADL Overall ADL's : Needs assistance/impaired Eating/Feeding: Set up;Sitting   Grooming: Set up;Min guard;Standing;Sitting Grooming Details (indicate cue type and reason): washed hands at sink Upper Body Bathing: Minimal assitance;Sitting   Lower Body Bathing: Minimal assistance;Sit to/from stand   Upper Body Dressing : Minimal assistance;Sitting Upper Body Dressing Details (indicate cue type and reason): assist to doff brace during session Lower Body Dressing: Minimal assistance;Sit to/from stand   Toilet Transfer: Min guard;Ambulation;RW (3n1 over toilet)   Toileting- Clothing Manipulation and Hygiene: Minimal assistance;Sit to/from stand Toileting - Clothing Manipulation Details (indicate cue type and reason): assist for pericare, educated on tongs as AE Tub/ Shower Transfer: Minimal assistance;Tub transfer;Ambulation;3 in 1;Rolling walker Tub/Shower Transfer Details (indicate cue type and reason): educated on technique and to have someone with her Functional mobility during ADLs: Min guard;Rolling walker General ADL Comments: Pt completed toilet transfer, pericare, washed hands at sink, and bed mobility as detailed above. Educated on techniques and AE/DME for ADLs. Spouse present.      Vision     Perception     Praxis      Pertinent Vitals/Pain Pain Assessment: 0-10 Pain Score: 6  Pain Location: back, at rest Pain Descriptors / Indicators: Aching  Pain Intervention(s): Limited activity within patient's tolerance;Monitored during  session;Repositioned     Hand Dominance Left   Extremity/Trunk Assessment Upper Extremity Assessment Upper Extremity Assessment: Generalized weakness;Overall [facility] for tasks assessed   Lower Extremity Assessment Lower Extremity Assessment: Defer to PT evaluation   Cervical / Trunk Assessment Cervical / Trunk Assessment: Other exceptions Cervical / Trunk Exceptions: s/p back surgery as documented above   Communication Communication Communication: No difficulties   Cognition Arousal/Alertness: Awake/alert Behavior During Therapy: WFL for tasks assessed/performed;Anxious Overall Cognitive Status: Within Functional Limits for tasks assessed                     General Comments       Exercises Exercises: Other exercises Other Exercises Other Exercises: Pt encouraged to ambulate short distance in home every hour w/ assist/supervision.   Shoulder Instructions      Home Living Family/patient expects to be discharged to:: Private residence Living Arrangements: Spouse/significant other Available Help at Discharge: Family;Available 24 hours/day Type of Home: House Home Access: Stairs to enter [employer] of Steps: 1 Entrance Stairs-Rails: None Home Layout: One level     Bathroom Shower/Tub: Tub/shower unit;Curtain Shower/tub characteristics: Architectural technologist: Standard     Home Equipment: Environmental consultant - 2 wheels;Cane - single point          Prior Functioning/Environment Level of Independence: Independent        Comments: Pt reports she was stooping and having a hard time moving around but was Ind w/ ADLs and ambulation    OT Diagnosis: Generalized weakness;Acute pain   OT Problem List: Impaired balance (sitting and/or standing);Decreased knowledge of use of DME or AE;Decreased knowledge of precautions;Pain   OT Treatment/Interventions: Self-care/ADL training;DME and/or AE instruction;Therapeutic activities;Patient/family education;Balance  training    OT Goals(Current goals can be found in the care plan section) Acute Rehab OT Goals Patient Stated Goal: to go home today OT Goal Formulation: With patient/family Time For Goal Achievement: 04/26/16 Potential to Achieve Goals: Good ADL Goals Pt Will Perform Grooming: with modified independence;standing Pt Will Perform Upper Body Bathing: with modified independence;sitting Pt Will Perform Lower Body Bathing: with modified independence;sit to/from stand Pt Will Perform Upper Body Dressing: with modified independence;sitting Pt Will Perform Lower Body Dressing: with modified independence;sit to/from stand Pt Will Transfer to Toilet: with modified independence;ambulating Pt Will Perform Toileting - Clothing Manipulation and hygiene: with modified independence;sitting/lateral leans;sit to/from stand Pt Will Perform Tub/Shower Transfer: Tub transfer;with supervision;ambulating;3 in 1;rolling walker Additional ADL Goal #1: Pt will complete bed mobility at mod I level to prepare for OOB ADLs.   OT Frequency: Min 2X/week   Barriers to D/C:            Co-evaluation              End of Session Equipment Utilized During Treatment: Rolling walker;Back brace Nurse Communication: Other (comment) (Pt asking about Xanax. )  Activity Tolerance: Patient tolerated treatment well;Patient limited by pain Patient left: in bed;with call bell/phone within reach;with family/visitor present   Time: [id number] OT Time Calculation (min): 23 min Charges:  OT General Charges $OT Visit: 1 Procedure OT Evaluation $OT Eval Low Complexity: 1 Procedure OT Treatments $Self Care/Home Management : 8-22 mins G-Codes:    Hortencia Pilar 05/16/2016, 9:47 AM

## 2016-04-19 NOTE — Progress Notes (Signed)
Pt and husband given D/C instructions with Rx's, verbal understanding was provided. Pt's incision is stained with minimal amount of drainage. Pt's IV was removed prior to D/C. Pt received 3-n-1 from [place] prior to D/C. Pt D/C'd home @ 1140 per MD order. Pt is stable @ D/C and has no other needs at this time. Holli Humbles, RN

## 2016-04-19 NOTE — Progress Notes (Signed)
    Patient doing well Patient denies R or L leg pain with ambulation + expected LBP   Physical Exam: Filed Vitals:   04/18/16 2355 04/19/16 0400  BP: 123/40 116/56  Pulse: 101 101  Temp: 98.7 F (37.1 C) 99.5 F (37.5 C)  Resp: 18 18    Dressing in place NVI  POD #1 s/p L4/5 decompression and fusion, doing well  - up with PT/OT, encourage ambulation - Percocet for pain, Valium for muscle spasms - likely d/c home today after PT

## 2016-04-19 NOTE — Evaluation (Signed)
Physical Therapy Evaluation Patient Details Name: Sandra Ferrell MRN: [id number] DOB: 14-Apr-1961 Today's Date: 04/19/2016   History of Present Illness  Pt is a 55 y/o F s/p Lt sided transforaminal lumbar interbody fusion, Rt sided L4-5 posterolatearl fusion.  Pt's PMH includes anxiety, depression, fibromyalgia, Rt carpal tunnel syndrome, ACDF C5-7.    Clinical Impression  Patient is s/p above surgery resulting in functional limitations due to the deficits listed below (see PT Problem List). PTA Sandra Ferrell Ind w/ ADLs and ambulation.  She will have 24/7 assist available from her husband at home at d/c.  She currently requires min assist for bed mobility and stair training and min guard assist for safe ambulation. Patient will benefit from skilled PT to increase their independence and safety with mobility to allow discharge to the venue listed below.      Follow Up Recommendations Home health PT;Supervision/Assistance - 24 hour    Equipment Recommendations  None recommended by PT    Recommendations for Other Services OT consult     Precautions / Restrictions Precautions Precautions: Back Precaution Booklet Issued: Yes (comment) Precaution Comments: Provided handout and reviewed back precautions and log roll technique Required Braces or Orthoses: Spinal Brace Spinal Brace: Thoracolumbosacral orthotic;Applied in sitting position (wear when OOB (except when only up to bathroom)) Restrictions Weight Bearing Restrictions: No      Mobility  Bed Mobility Overal bed mobility: Needs Assistance Bed Mobility: Rolling;Sidelying to Sit Rolling: Min assist Sidelying to sit: Min assist       General bed mobility comments: Assist to facilitate roll w/ cues provided for technique.  Assist to boost to sitting.  HOB flat and no use of rails to simulate home environment.  Transfers Overall transfer level: Needs assistance Equipment used: Rolling walker (2 wheeled);None Transfers: Sit to/from  Stand Sit to Stand: Min guard;Min assist         General transfer comment: Min assist to boost and HHA when standing w/o AD.  Min guard and pt slow to stand w/ use of RW.  Ambulation/Gait Ambulation/Gait assistance: Min guard Ambulation Distance (Feet): 90 Feet Assistive device: Rolling walker (2 wheeled) Gait Pattern/deviations: Step-through pattern;Decreased stride length;Trunk flexed   Gait velocity interpretation: Below normal speed for age/gender General Gait Details: Cues for upright posture and proper management of RW.  Min guard assist for safety.  Stairs Stairs: Yes Stairs assistance: Min assist Stair Management: One rail Left;Step to pattern;Forwards Number of Stairs: 1 (x3) General stair comments: Pt unable to complete stair training w/o Bil UE support.  HHA on Rt side and pt uses rail on Lt side.  Discussed w/ pt and husband that they will need a friend/family member to assist them to enter the home at d/c.  Both understanding and agreeable to arrange.  Wheelchair Mobility    Modified Rankin (Stroke Patients Only)       Balance Overall balance assessment: Needs assistance Sitting-balance support: No upper extremity supported;Feet supported Sitting balance-Leahy Scale: Fair Sitting balance - Comments: Pt able to sit EOB w/o UE support and eat crackers   Standing balance support: Bilateral upper extremity supported;During functional activity Standing balance-Leahy Scale: Poor Standing balance comment: Relies on UE support                             Pertinent Vitals/Pain Pain Assessment: 0-10 Pain Score: 6  Pain Location: back, headache Pain Descriptors / Indicators: Headache;Aching Pain Intervention(s): Limited activity within patient's tolerance;Monitored  during session;Repositioned    Home Living Family/patient expects to be discharged to:: Private residence Living Arrangements: Spouse/significant other Available Help at Discharge:  Family;Available 24 hours/day Type of Home: House Home Access: Stairs to enter Entrance Stairs-Rails: None Entrance Stairs-Number of Steps: 1 Home Layout: One level Home Equipment: Walker - 2 wheels;Cane - single point      Prior Function Level of Independence: Independent         Comments: Pt reports she was stooping and having a hard time moving around but was Ind w/ ADLs and ambulation     Hand Dominance   Dominant Hand: Left    Extremity/Trunk Assessment   Upper Extremity Assessment: Defer to OT evaluation           Lower Extremity Assessment: Overall WFL for tasks assessed      Cervical / Trunk Assessment: Other exceptions  Communication   Communication: No difficulties  Cognition Arousal/Alertness: Awake/alert Behavior During Therapy: WFL for tasks assessed/performed;Anxious Overall Cognitive Status: Within Functional Limits for tasks assessed                      General Comments General comments (skin integrity, edema, etc.): Husband assisted this therapist in donning pt's brace while pt sitting EOB.     Exercises Other Exercises Other Exercises: Pt encouraged to ambulate short distance in home every hour w/ assist/supervision.      Assessment/Plan    PT Assessment Patient needs continued PT services  PT Diagnosis Difficulty walking;Acute pain   PT Problem List Decreased range of motion;Decreased activity tolerance;Decreased balance;Decreased mobility;Decreased knowledge of use of DME;Decreased safety awareness;Decreased knowledge of precautions;Pain  PT Treatment Interventions DME instruction;Gait training;Stair training;Functional mobility training;Therapeutic activities;Therapeutic exercise;Balance training;Neuromuscular re-education;Patient/family education;Modalities   PT Goals (Current goals can be found in the Care Plan section) Acute Rehab PT Goals Patient Stated Goal: to go home today PT Goal Formulation: With patient/family Time  For Goal Achievement: 04/26/16 Potential to Achieve Goals: Good    Frequency Min 5X/week   Barriers to discharge        Co-evaluation               End of Session Equipment Utilized During Treatment: Gait belt;Back brace Activity Tolerance: Patient tolerated treatment well Patient left: in bed;with call bell/phone within reach;with family/visitor present;Other (comment) (sitting EOB) Nurse Communication: Mobility status;Other (comment);Precautions (pt requests for her blood sugar to be checked)         Time: [id number] PT Time Calculation (min) (ACUTE ONLY): 26 min   Charges:   PT Evaluation $PT Eval Low Complexity: 1 Procedure PT Treatments $Gait Training: 8-22 mins   PT G Codes:       Collie Siad PT, DPT  Pager: [phone] Phone: [phone] 04/19/2016, 8:28 AM

## 2016-04-19 NOTE — Care Management Note (Signed)
Case Management Note  Patient Details  Name: [name] MRN: [id number] Date of Birth: Jul 15, 1961  Subjective/Objective:    Pt is a 55 y/o F s/p Lt sided transforaminal lumbar interbody fusion, Rt sided L4-5 posterolatearl fusion .              Action/Plan: Case manager spoke with patient at the bedside concerning home health and DME needs. Choice was offered for home health. Patient states she has used [facility] in the past, wishes to do so now. Referral was called to [place], [place]. Patient states she has rolling walker and 3in1 at home. Will have family support at discharge.    Expected Discharge Date:   04/19/16               Expected Discharge Plan:  [place]  In-House Referral:     Discharge planning Services  CM Consult  Post Acute Care Choice:    Choice offered to:  Patient  DME Arranged:  N/A DME Agency:  NA  HH Arranged:  PT [place] Agency:  [place]  Status of Service:  Completed, signed off  Medicare Important Message Given:    Date Medicare IM Given:    Medicare IM give by:    Date Additional Medicare IM Given:    Additional Medicare Important Message give by:     If discussed at [place] of Stay Meetings, dates discussed:    Additional Comments:  Ninfa Meeker, RN 04/19/2016, 10:35 AM

## 2016-04-20 DIAGNOSIS — Z794 Long term (current) use of insulin: Secondary | ICD-10-CM | POA: Diagnosis not present

## 2016-04-20 DIAGNOSIS — Z7984 Long term (current) use of oral hypoglycemic drugs: Secondary | ICD-10-CM | POA: Diagnosis not present

## 2016-04-20 DIAGNOSIS — K219 Gastro-esophageal reflux disease without esophagitis: Secondary | ICD-10-CM | POA: Diagnosis not present

## 2016-04-20 DIAGNOSIS — E785 Hyperlipidemia, unspecified: Secondary | ICD-10-CM | POA: Diagnosis not present

## 2016-04-20 DIAGNOSIS — Z4789 Encounter for other orthopedic aftercare: Secondary | ICD-10-CM | POA: Diagnosis not present

## 2016-04-20 DIAGNOSIS — I10 Essential (primary) hypertension: Secondary | ICD-10-CM | POA: Diagnosis not present

## 2016-04-20 DIAGNOSIS — Z87891 Personal history of nicotine dependence: Secondary | ICD-10-CM | POA: Diagnosis not present

## 2016-04-20 DIAGNOSIS — J45909 Unspecified asthma, uncomplicated: Secondary | ICD-10-CM | POA: Diagnosis not present

## 2016-04-20 DIAGNOSIS — G4730 Sleep apnea, unspecified: Secondary | ICD-10-CM | POA: Diagnosis not present

## 2016-04-20 DIAGNOSIS — M797 Fibromyalgia: Secondary | ICD-10-CM | POA: Diagnosis not present

## 2016-04-20 DIAGNOSIS — Z792 Long term (current) use of antibiotics: Secondary | ICD-10-CM | POA: Diagnosis not present

## 2016-04-20 DIAGNOSIS — E119 Type 2 diabetes mellitus without complications: Secondary | ICD-10-CM | POA: Diagnosis not present

## 2016-04-20 LAB — TYPE AND SCREEN
ABO/RH(D): A POS
ANTIBODY SCREEN: NEGATIVE
UNIT DIVISION: 0
Unit division: 0

## 2016-04-20 LAB — GLUCOSE, CAPILLARY: GLUCOSE-CAPILLARY: 126 mg/dL — AB (ref 65–99)

## 2016-04-24 ENCOUNTER — Telehealth: Payer: Self-pay

## 2016-04-24 ENCOUNTER — Other Ambulatory Visit: Payer: Self-pay | Admitting: Family Medicine

## 2016-04-24 DIAGNOSIS — Z794 Long term (current) use of insulin: Secondary | ICD-10-CM | POA: Diagnosis not present

## 2016-04-24 DIAGNOSIS — I10 Essential (primary) hypertension: Secondary | ICD-10-CM | POA: Diagnosis not present

## 2016-04-24 DIAGNOSIS — Z4789 Encounter for other orthopedic aftercare: Secondary | ICD-10-CM | POA: Diagnosis not present

## 2016-04-24 DIAGNOSIS — Z7984 Long term (current) use of oral hypoglycemic drugs: Secondary | ICD-10-CM | POA: Diagnosis not present

## 2016-04-24 DIAGNOSIS — E785 Hyperlipidemia, unspecified: Secondary | ICD-10-CM | POA: Diagnosis not present

## 2016-04-24 DIAGNOSIS — E119 Type 2 diabetes mellitus without complications: Secondary | ICD-10-CM | POA: Diagnosis not present

## 2016-04-24 DIAGNOSIS — Z87891 Personal history of nicotine dependence: Secondary | ICD-10-CM | POA: Diagnosis not present

## 2016-04-24 DIAGNOSIS — M797 Fibromyalgia: Secondary | ICD-10-CM | POA: Diagnosis not present

## 2016-04-24 DIAGNOSIS — G4730 Sleep apnea, unspecified: Secondary | ICD-10-CM | POA: Diagnosis not present

## 2016-04-24 DIAGNOSIS — J45909 Unspecified asthma, uncomplicated: Secondary | ICD-10-CM | POA: Diagnosis not present

## 2016-04-24 DIAGNOSIS — Z792 Long term (current) use of antibiotics: Secondary | ICD-10-CM | POA: Diagnosis not present

## 2016-04-24 DIAGNOSIS — K219 Gastro-esophageal reflux disease without esophagitis: Secondary | ICD-10-CM | POA: Diagnosis not present

## 2016-04-24 NOTE — Telephone Encounter (Signed)
It is okay to take together.. Gemfibrozil just makes actos more potent.

## 2016-04-24 NOTE — Telephone Encounter (Signed)
Tillie Rung PT with [facility] left v/m about a possible drug interaction between Actos and Gemfibrozil. Came up in [facility] system that Actos and Gemfibrozil are contraindicated. Should pt be taking both meds. Kendra's system does not specify what the drug interaction is. [place] request cb.

## 2016-04-25 NOTE — Telephone Encounter (Signed)
Last office visit 04/13/2016.  Not on current medication list.  Refill?

## 2016-04-25 NOTE — Telephone Encounter (Signed)
Pt left v/m requesting cb about status of Tylenol # 3 refill.

## 2016-04-25 NOTE — Telephone Encounter (Signed)
Kendra with [facility] notified as instructed by telephone.

## 2016-04-26 ENCOUNTER — Encounter: Payer: Self-pay | Admitting: Family Medicine

## 2016-04-26 DIAGNOSIS — Z792 Long term (current) use of antibiotics: Secondary | ICD-10-CM | POA: Diagnosis not present

## 2016-04-26 DIAGNOSIS — E785 Hyperlipidemia, unspecified: Secondary | ICD-10-CM | POA: Diagnosis not present

## 2016-04-26 DIAGNOSIS — G4730 Sleep apnea, unspecified: Secondary | ICD-10-CM | POA: Diagnosis not present

## 2016-04-26 DIAGNOSIS — M797 Fibromyalgia: Secondary | ICD-10-CM | POA: Diagnosis not present

## 2016-04-26 DIAGNOSIS — E119 Type 2 diabetes mellitus without complications: Secondary | ICD-10-CM | POA: Diagnosis not present

## 2016-04-26 DIAGNOSIS — K219 Gastro-esophageal reflux disease without esophagitis: Secondary | ICD-10-CM | POA: Diagnosis not present

## 2016-04-26 DIAGNOSIS — J45909 Unspecified asthma, uncomplicated: Secondary | ICD-10-CM | POA: Diagnosis not present

## 2016-04-26 DIAGNOSIS — Z87891 Personal history of nicotine dependence: Secondary | ICD-10-CM | POA: Diagnosis not present

## 2016-04-26 DIAGNOSIS — Z7984 Long term (current) use of oral hypoglycemic drugs: Secondary | ICD-10-CM | POA: Diagnosis not present

## 2016-04-26 DIAGNOSIS — Z4789 Encounter for other orthopedic aftercare: Secondary | ICD-10-CM | POA: Diagnosis not present

## 2016-04-26 DIAGNOSIS — I10 Essential (primary) hypertension: Secondary | ICD-10-CM | POA: Diagnosis not present

## 2016-04-26 DIAGNOSIS — Z794 Long term (current) use of insulin: Secondary | ICD-10-CM | POA: Diagnosis not present

## 2016-04-26 NOTE — Telephone Encounter (Signed)
Tylenol #3 called into [facility] on [address].

## 2016-04-26 NOTE — Telephone Encounter (Signed)
Mr. Alfonso notified prescription has been called into [facility].

## 2016-04-26 NOTE — Discharge Summary (Signed)
Patient ID: Sandra Ferrell MRN: [id number] DOB/AGE: 56/22/1962 56 y.o.  Admit date: 04/18/2016 Discharge date: 04/19/2016  Admission Diagnoses:  Active Problems:   Neurogenic claudication   Discharge Diagnoses:  Same  Past Medical History  Diagnosis Date  . Allergic rhinitis, cause unspecified   . Anxiety state, unspecified   . Extrinsic asthma with exacerbation   . Backache, unspecified   . Type II or unspecified type diabetes mellitus without mention of complication, not stated as uncontrolled   . Esophageal reflux   . Other and unspecified hyperlipidemia   . Osteoarthrosis, unspecified whether generalized or localized, unspecified site   . Other screening mammogram   . Routine gynecological examination   . Unspecified sleep apnea   . Tobacco use disorder   . Personal history of unspecified urinary disorder   . Routine general medical examination at a health care facility   . Heart murmur   . Hypertension   . Calculus of kidney   . Fibromyalgia   . Wears glasses   . Carpal tunnel syndrome, right   . Pneumonia   . Depressive disorder, not elsewhere classified     managed with medications    Surgeries: Procedure(s): LEFT SIDED LUMBAR 4-5 TRANSFORAMINAL LUMBAR INTERBODY FUSION WITH INSTRUMENTATION AND ALLOGRAFT on 04/18/2016   Consultants:  None  Discharged Condition: Improved  Hospital Course: Sandra Ferrell is an 55 y.o. female who was admitted 04/18/2016 for operative treatment of  Spinal stenosis. Patient has severe unremitting pain that affects sleep, daily activities, and work/hobbies. After pre-op clearance the patient was taken to the operating room on 04/18/2016 and underwent  Procedure(s): LEFT SIDED LUMBAR 4-5 TRANSFORAMINAL LUMBAR INTERBODY FUSION WITH INSTRUMENTATION AND ALLOGRAFT.    Patient was given perioperative antibiotics:  Anti-infectives    Start     Dose/Rate Route Frequency Ordered Stop   04/19/16 0230  vancomycin (VANCOCIN) 1,250 mg in  sodium chloride 0.9 % 250 mL IVPB     1,250 mg 166.7 mL/hr over 90 Minutes Intravenous  Once 04/18/16 2217 04/19/16 0421   04/18/16 1230  vancomycin (VANCOCIN) 1,500 mg in sodium chloride 0.9 % 500 mL IVPB     1,500 mg 250 mL/hr over 120 Minutes Intravenous To [facility] 04/17/16 1123 04/18/16 1423       Patient was given sequential compression devices, early ambulation to prevent DVT.  Patient benefited maximally from hospital stay and there were no complications.    Recent vital signs: BP 125/58 mmHg  Pulse 102  Temp(Src) 100.6 F (38.1 C) (Oral)  Resp 20  Ht 4' 10.5" (1.486 m)  Wt 99.791 kg (220 lb)  BMI 45.19 kg/m2  SpO2 93%  Discharge Medications:     Medication List    STOP taking these medications        acetaminophen 325 MG tablet  Commonly known as:  TYLENOL      TAKE these medications        ACCU-CHEK AVIVA PLUS test strip  Generic drug:  glucose blood  Check blood sugar twice a day and as directed. Dx E11.9     ACCU-CHEK AVIVA PLUS w/Device Kit     Adjustable Lancing Device Misc  Check blood sugar twice a day and as directed. Dx E11.9     AIMSCO INSULIN SYR ULTRA THIN 31G X 5/16" 0.3 ML Misc  Generic drug:  Insulin Syringe-Needle U-100     Alcohol Swabs Pads  Check blood sugar twice a day and as directed. Dx E11.9  ALPRAZolam 0.5 MG tablet  Commonly known as:  XANAX  TAKE 1 TABLET BY MOUTH THREE TIMES DAILY     amitriptyline 50 MG tablet  Commonly known as:  ELAVIL  TAKE 1 TABLET(50 MG) BY MOUTH AT BEDTIME     ARNICARE ARNICA Crea  Apply 1 application topically as needed (Apply to ankles for pain.).     aspirin EC 81 MG tablet  Take 81 mg by mouth daily.     B-D ULTRAFINE III SHORT PEN 31G X 8 MM Misc  Generic drug:  Insulin Pen Needle  USE AS DIRECTED WITH INSULIN PEN     Insulin Pen Needle 32G X 6 MM Misc  Commonly known as:  NOVOFINE  Use to take victoza once daily and as directed.     gabapentin 300 MG capsule    Commonly known as:  NEURONTIN  TAKE 1 CAPSULE(300 MG) BY MOUTH THREE TIMES DAILY     gemfibrozil 600 MG tablet  Commonly known as:  LOPID  TAKE 1 TABLET BY MOUTH TWICE DAILY     hydrochlorothiazide 25 MG tablet  Commonly known as:  HYDRODIURIL  TAKE 1 TABLET BY MOUTH EVERY DAY     Insulin Glargine 100 UNIT/ML Solostar Pen  Commonly known as:  LANTUS SOLOSTAR  Inject 30 Units into the skin at bedtime.     metFORMIN 1000 MG tablet  Commonly known as:  GLUCOPHAGE  TAKE 1 TABLET(1000 MG) BY MOUTH TWICE DAILY WITH A MEAL     methocarbamol 500 MG tablet  Commonly known as:  ROBAXIN  TAKE 1 TABLET(500 MG) BY MOUTH THREE TIMES DAILY     pioglitazone 45 MG tablet  Commonly known as:  ACTOS  TAKE 1 TABLET BY MOUTH EVERY DAY     PROAIR HFA 108 (90 Base) MCG/ACT inhaler  Generic drug:  albuterol  INHALE 2 PUFFS BY MOUTH EVERY 4 HOURS AS NEEDED FOR WHEEZING OR SHORTNESS OF BREATH     ranitidine 150 MG tablet  Commonly known as:  ZANTAC  Take 150 mg by mouth 2 (two) times daily.     SYMBICORT 80-4.5 MCG/ACT inhaler  Generic drug:  budesonide-formoterol  INHALE 2 PUFFS BY MOUTH TWICE DAILY     VICTOZA 18 MG/3ML Sopn  Generic drug:  Liraglutide  ADMINISTER 1.8 MG UNDER THE SKIN DAILY        Diagnostic Studies: Dg Chest 2 View  04/17/2016  CLINICAL DATA:  Lumbar fusion. EXAM: CHEST  2 VIEW COMPARISON:  None. FINDINGS: Mediastinum hilar structures normal. Lungs are clear. Heart size normal. No pleural effusion or pneumothorax. Degenerative changes thoracic spine with diffuse osteopenia. Stable mild thoracic spine compression fractures. Prior cervical spine fusion. IMPRESSION: No acute cardiopulmonary disease. Electronically Signed   By: Marcello Moores  Register   On: 04/17/2016 10:42   Dg Lumbar Spine 2-3 Views  04/18/2016  CLINICAL DATA:  L4-5 fusion. EXAM: LUMBAR SPINE - 2-3 VIEW; DG C-ARM 61-120 MIN COMPARISON:  Intraoperative views same date.  Lumbar MRI 03/30/2016 FINDINGS: PA and  lateral spot fluoroscopic images of the lower lumbar spine demonstrate interval pedicle screw and interbody fusion at L4-5. The hardware appears well positioned. The alignment is stable. No complications are identified. IMPRESSION: No demonstrated complication following U7-2 fusion. Electronically Signed   By: Richardean Sale M.D.   On: 04/18/2016 19:06   Mr Lumbar Spine Wo Contrast  03/31/2016  CLINICAL DATA:  55 year old female with chronic low back pain extending into both legs. No known injury. Weakness and  numbness in extremities. Subsequent encounter. EXAM: MRI LUMBAR SPINE WITHOUT CONTRAST TECHNIQUE: Multiplanar, multisequence MR imaging of the lumbar spine was performed. No intravenous contrast was administered. COMPARISON:  04/25/2014 MR. FINDINGS: Segmentation: Last fully open disc space labeled L5-S1. Present examination incorporates from the T11-12 disc space to the lower sacrum. Alignment:  Scoliosis lumbar spine. Vertebrae:  Endplate reactive changes T12-L1 through L2-3. Conus medullaris: Extends to the L1-2 level and appears normal. Artifact extends through distal cord. Paraspinal and other soft tissues: No worrisome abnormality. Disc levels: T11-12: Prominent Schmorl's node deformity. Bulge. Mild narrowing ventral thecal sac. T12-L1: Prominent disc degeneration with small Schmorl's node deformity. Minimal retrolisthesis and rotation T12. Bulge. Narrowing ventral thecal sac with minimal posterior displacement of the cord. L1-2: Prominent disc degeneration and disc space narrowing with endplate reactive changes greater on the right. Bulge and osteophyte greater to the right with slight encroachment upon exiting right L1 nerve root. Slightly prominent epidural fat. No significant thecal sac compromise. L2-3: Minimal retrolisthesis L2. Moderate bulge. Moderate facet degenerative changes. Short pedicles. Prominent epidural fat. Multifactorial moderate thecal sac narrowing. Mild foraminal narrowing  greater on the left. L3-4: Bulge. Facet degenerative changes. Short pedicles. Prominent epidural fat. Multifactorial moderate thecal sac narrowing. Mild foraminal narrowing greater on the left. L4-5: Severe bilateral facet degenerative changes and ligamentum flavum hypertrophy greater on the left. 6 mm anterior slip L4. Bulge with greatest extension left foraminal position with marked left foraminal narrowing and compression of the exiting left L4 nerve root. Moderate right foraminal narrowing. Multifactorial severe spinal stenosis and lateral recess stenosis greater on the left. L5-S1: Moderate facet degenerative changes with bony overgrowth greater on left. Ligamentum flavum hypertrophy. Left lateral bulge/osteophyte. Mild right-sided and mild to slightly moderate left-sided foraminal narrowing. Minimal left lateral recess stenosis. No significant thecal sac compromise. IMPRESSION: When compared to prior examination, progressive degenerative changes with findings once again most notable at the L4-5 level. Summary pertinent findings includes: L4-5 multifactorial marked left foraminal narrowing and compression of the exiting left L4 nerve root. Moderate right foraminal narrowing. Multifactorial severe spinal stenosis and lateral recess stenosis greater on the left. L3-4 multifactorial moderate thecal sac narrowing. Mild foraminal narrowing greater on the left. L2-3 multifactorial moderate thecal sac narrowing. Mild foraminal narrowing greater on the left. L5-S1 multifactorial mild right-sided and mild to slightly moderate left-sided foraminal narrowing. Minimal left lateral recess stenosis. No significant thecal sac compromise. L1-2 bulge and osteophyte greater to the right with slight encroachment upon exiting right L1 nerve root. Slightly prominent epidural fat. No significant thecal sac compromise. T12-L1 minimal retrolisthesis and rotation T12. Bulge. Narrowing ventral thecal sac with minimal posterior  displacement of the cord. T11-12 bulge.  Mild narrowing ventral thecal sac. Please see above for further detail. Electronically Signed   By: Genia Del M.D.   On: 03/31/2016 14:56   Dg Lumbar Spine 1 View  04/18/2016  CLINICAL DATA:  55 year old female undergoing lumbar surgery. Initial encounter. EXAM: LUMBAR SPINE - 1 VIEW COMPARISON:  Lumbar MRI 03/30/2016. FINDINGS: Intraoperative portable cross-table lateral view of the lumbar spine at 1457 hours. Lumbar segmentation does appear to be normal which corresponds to the numbering system on 03/30/2016. Grade 1 anterolisthesis Re demonstrated at L4-L5. Two posterior approach needles are in place. The more cephalad needle is directed at the L2 spinous process level. The more caudal needle is directed between the L3 and L4 spinous processes. Lower thoracic and upper lumbar vacuum disc and endplate spurring re- demonstrated. Calcified aortic atherosclerosis. IMPRESSION:  Intraoperative localization as above. Electronically Signed   By: Genevie Ann M.D.   On: 04/18/2016 16:55   Dg C-arm 61-120 Min  04/18/2016  CLINICAL DATA:  L4-5 fusion. EXAM: LUMBAR SPINE - 2-3 VIEW; DG C-ARM 61-120 MIN COMPARISON:  Intraoperative views same date.  Lumbar MRI 03/30/2016 FINDINGS: PA and lateral spot fluoroscopic images of the lower lumbar spine demonstrate interval pedicle screw and interbody fusion at L4-5. The hardware appears well positioned. The alignment is stable. No complications are identified. IMPRESSION: No demonstrated complication following K8-1 fusion. Electronically Signed   By: Richardean Sale M.D.   On: 04/18/2016 19:06    Disposition: 01-Home or Self Care   POD #1 s/p L4/5 decompression and fusion, doing well  -Written scripts for pain signed and in chart -D/C instructions sheet printed and in chart -D/C today  -F/U in office 2 weeks   Signed: Justice Britain 04/26/2016, 11:11 AM

## 2016-04-30 ENCOUNTER — Telehealth: Payer: Self-pay

## 2016-04-30 ENCOUNTER — Encounter: Payer: Self-pay | Admitting: Primary Care

## 2016-04-30 ENCOUNTER — Ambulatory Visit (INDEPENDENT_AMBULATORY_CARE_PROVIDER_SITE_OTHER): Payer: Medicare Other | Admitting: Primary Care

## 2016-04-30 VITALS — BP 124/74 | HR 104 | Temp 98.4°F | Ht 58.25 in | Wt 219.1 lb

## 2016-04-30 DIAGNOSIS — R319 Hematuria, unspecified: Secondary | ICD-10-CM

## 2016-04-30 DIAGNOSIS — N390 Urinary tract infection, site not specified: Secondary | ICD-10-CM | POA: Diagnosis not present

## 2016-04-30 LAB — POC URINALSYSI DIPSTICK (AUTOMATED)
BILIRUBIN UA: NEGATIVE
GLUCOSE UA: NEGATIVE
KETONES UA: NEGATIVE
Nitrite, UA: NEGATIVE
PH UA: 6.5
SPEC GRAV UA: 1.02
Urobilinogen, UA: NEGATIVE

## 2016-04-30 MED ORDER — SULFAMETHOXAZOLE-TRIMETHOPRIM 800-160 MG PO TABS: 1.0000 | ORAL_TABLET | Freq: Two times a day (BID) | ORAL | Status: DC

## 2016-04-30 NOTE — Patient Instructions (Signed)
Hold your aspirin for 5 days.  Start Bactrim DS (sulfamethoxazole/trimethoprim) tablets for urinary tract infection. Take 1 tablet by mouth twice daily for 5 days.  Ensure you are staying hydrated with water.  Please notify me if no improvement in symptoms in 3-4 days, especially if no improvement in bleeding.  It was a pleasure meeting you!  Urinary Tract Infection Urinary tract infections (UTIs) can develop anywhere along your urinary tract. Your urinary tract is your body's drainage system for removing wastes and extra water. Your urinary tract includes two kidneys, two ureters, a bladder, and a urethra. Your kidneys are a pair of bean-shaped organs. Each kidney is about the size of your fist. They are located below your ribs, one on each side of your spine. CAUSES Infections are caused by microbes, which are microscopic organisms, including fungi, viruses, and bacteria. These organisms are so small that they can only be seen through a microscope. Bacteria are the microbes that most commonly cause UTIs. SYMPTOMS  Symptoms of UTIs may vary by age and gender of the patient and by the location of the infection. Symptoms in young women typically include a frequent and intense urge to urinate and a painful, burning feeling in the bladder or urethra during urination. Older women and men are more likely to be tired, shaky, and weak and have muscle aches and abdominal pain. A fever may mean the infection is in your kidneys. Other symptoms of a kidney infection include pain in your back or sides below the ribs, nausea, and vomiting. DIAGNOSIS To diagnose a UTI, your caregiver will ask you about your symptoms. Your caregiver will also ask you to provide a urine sample. The urine sample will be tested for bacteria and white blood cells. White blood cells are made by your body to help fight infection. TREATMENT  Typically, UTIs can be treated with medication. Because most UTIs are caused by a bacterial  infection, they usually can be treated with the use of antibiotics. The choice of antibiotic and length of treatment depend on your symptoms and the type of bacteria causing your infection. HOME CARE INSTRUCTIONS  If you were prescribed antibiotics, take them exactly as your caregiver instructs you. Finish the medication even if you feel better after you have only taken some of the medication.  Drink enough water and fluids to keep your urine clear or pale yellow.  Avoid caffeine, tea, and carbonated beverages. They tend to irritate your bladder.  Empty your bladder often. Avoid holding urine for long periods of time.  Empty your bladder before and after sexual intercourse.  After a bowel movement, women should cleanse from front to back. Use each tissue only once. SEEK MEDICAL CARE IF:   You have back pain.  You develop a fever.  Your symptoms do not begin to resolve within 3 days. SEEK IMMEDIATE MEDICAL CARE IF:   You have severe back pain or lower abdominal pain.  You develop chills.  You have nausea or vomiting.  You have continued burning or discomfort with urination. MAKE SURE YOU:   Understand these instructions.  Will watch your condition.  Will get help right away if you are not doing well or get worse.   This information is not intended to replace advice given to you by your health care provider. Make sure you discuss any questions you have with your health care provider.   Document Released: 08/01/2005 Document Revised: 07/13/2015 Document Reviewed: 11/30/2011 Elsevier Interactive Patient Education [employer].

## 2016-04-30 NOTE — Telephone Encounter (Signed)
Ginger PT with [facility] said pt is having a lot of blood in urine with frequency of urine; lower abd pain; no blood thinners. Scheduled appt on 04/30/16 at 10:45 with Gentry Fitz NP. If pt condition changes or worsens prior to appt pt will go to ED.

## 2016-04-30 NOTE — Telephone Encounter (Signed)
Noted and agree. 

## 2016-04-30 NOTE — Progress Notes (Signed)
Subjective:    Patient ID: Sandra Ferrell, female    DOB: 1961/07/17, 55 y.o.   MRN: [id number]  HPI  Ms. Piggee is a 55 year old postmenopausal female who presents today with a chief complaint of hematuria. She also reports urinary frequency, dysuria, pelvic discomfort. Her symptoms have been present since yesterday. Her family member reports gross hematuria at home this morning. Denies vomiting, fevers, flank pain, vaginal symptoms. She's not taken anything OTC for her symptoms. She's been taking hydrocodone and valium from her recent back surgery.   Review of Systems  Constitutional: Negative for fever, chills and fatigue.  Gastrointestinal: Positive for nausea. Negative for vomiting and abdominal pain.  Genitourinary: Positive for dysuria, frequency and pelvic pain. Negative for flank pain and vaginal discharge.       Past Medical History  Diagnosis Date  . Allergic rhinitis, cause unspecified   . Anxiety state, unspecified   . Extrinsic asthma with exacerbation   . Backache, unspecified   . Type II or unspecified type diabetes mellitus without mention of complication, not stated as uncontrolled   . Esophageal reflux   . Other and unspecified hyperlipidemia   . Osteoarthrosis, unspecified whether generalized or localized, unspecified site   . Other screening mammogram   . Routine gynecological examination   . Unspecified sleep apnea   . Tobacco use disorder   . Personal history of unspecified urinary disorder   . Routine general medical examination at a health care facility   . Heart murmur   . Hypertension   . Calculus of kidney   . Fibromyalgia   . Wears glasses   . Carpal tunnel syndrome, right   . Pneumonia   . Depressive disorder, not elsewhere classified     managed with medications     Social History   Social History  . Marital Status: Married    Spouse Name: N/A  . Number of Children: N/A  . Years of Education: N/A   Occupational History  . Housewife      Social History Main Topics  . Smoking status: Current Every Day Smoker -- 1.00 packs/day for 35 years    Types: Cigarettes  . Smokeless tobacco: Never Used  . Alcohol Use: No  . Drug Use: No  . Sexual Activity: Not on file   Other Topics Concern  . Not on file   Social History Narrative   Moderate diet.    No living will, no HCPOA.    Past Surgical History  Procedure Laterality Date  . Appendectomy  1973  . Cholecystectomy  08/2006  . Transthoracic echocardiogram  02/2011    mild LVH, nl EF, mild diastolic dysfunction, no wall motion abnl  . Colonoscopy    . Anterior cervical decomp/discectomy fusion N/A 01/19/2015    Procedure: ANTERIOR CERVICAL DECOMPRESSION/DISCECTOMY FUSION 2 LEVELS;  Surgeon: Phylliss Bob, MD;  Location: [facility];  Service: Orthopedics;  Laterality: N/A;  Anterior cervical decompression fusion, cervical 5-6, cervical 6-7 with instrumentation and allograft    Family History  Problem Relation Age of Onset  . Stroke Father   . Colon cancer Cousin     Allergies  Allergen Reactions  . Benazepril Anaphylaxis, Swelling and Other (See Comments)    Angioedema, throat swelling  . Diclofenac Sodium Other (See Comments)    GI bleed  . Fluticasone-Salmeterol Hives, Itching and Swelling    Tongue was swollen  . Nsaids Other (See Comments)    Rectal bleeding  . Penicillins Hives and  Rash    Has patient had a PCN reaction causing immediate rash, facial/tongue/throat swelling, SOB or lightheadedness with hypotension: no Has patient had a PCN reaction causing severe rash involving mucus membranes or skin necrosis: no Has patient had a PCN reaction that required hospitalization no Has patient had a PCN reaction occurring within the last 10 years: no If all of the above answers are "NO", then may proceed with Cephalosporin use.   . Aspirin Other (See Comments)    Burns stomach  . Propoxyphene Other (See Comments)    Darvocet - Headache    Current Outpatient  Prescriptions on File Prior to Visit  Medication Sig Dispense Refill  . ACCU-CHEK AVIVA PLUS test strip Check blood sugar twice a day and as directed. Dx E11.9 100 each 5  . AIMSCO INSULIN SYR ULTRA THIN 31G X 5/16" 0.3 ML MISC     . Alcohol Swabs PADS Check blood sugar twice a day and as directed. Dx E11.9 100 each 5  . ALPRAZolam (XANAX) 0.5 MG tablet TAKE 1 TABLET BY MOUTH THREE TIMES DAILY 90 tablet 0  . amitriptyline (ELAVIL) 50 MG tablet TAKE 1 TABLET(50 MG) BY MOUTH AT BEDTIME 30 tablet 0  . aspirin EC 81 MG tablet Take 81 mg by mouth daily.    . B-D ULTRAFINE III SHORT PEN 31G X 8 MM MISC USE AS DIRECTED WITH INSULIN PEN 100 each 5  . Blood Glucose Monitoring Suppl (ACCU-CHEK AVIVA PLUS) W/DEVICE KIT     . gabapentin (NEURONTIN) 300 MG capsule TAKE 1 CAPSULE(300 MG) BY MOUTH THREE TIMES DAILY 90 capsule 0  . gemfibrozil (LOPID) 600 MG tablet TAKE 1 TABLET BY MOUTH TWICE DAILY 60 tablet 5  . Homeopathic Products (ARNICARE ARNICA) CREA Apply 1 application topically as needed (Apply to ankles for pain.).     Marland Kitchen hydrochlorothiazide (HYDRODIURIL) 25 MG tablet TAKE 1 TABLET BY MOUTH EVERY DAY 30 tablet 5  . Insulin Glargine (LANTUS SOLOSTAR) 100 UNIT/ML Solostar Pen Inject 30 Units into the skin at bedtime. (Patient taking differently: Inject 35 Units into the skin at bedtime as needed (For blood sugar.). ) 5 pen 11  . Insulin Pen Needle (NOVOFINE) 32G X 6 MM MISC Use to take victoza once daily and as directed. 100 each 3  . Lancet Devices (ADJUSTABLE LANCING DEVICE) MISC Check blood sugar twice a day and as directed. Dx E11.9 100 each 5  . metFORMIN (GLUCOPHAGE) 1000 MG tablet TAKE 1 TABLET(1000 MG) BY MOUTH TWICE DAILY WITH A MEAL 60 tablet 5  . methocarbamol (ROBAXIN) 500 MG tablet TAKE 1 TABLET(500 MG) BY MOUTH THREE TIMES DAILY (Patient taking differently: TAKE TWO TABLETS (1000MG) BY MOUTH AT BEDTIME.) 90 tablet 0  . pioglitazone (ACTOS) 45 MG tablet TAKE 1 TABLET BY MOUTH EVERY DAY 30  tablet 5  . PROAIR HFA 108 (90 Base) MCG/ACT inhaler INHALE 2 PUFFS BY MOUTH EVERY 4 HOURS AS NEEDED FOR WHEEZING OR SHORTNESS OF BREATH 8.5 g 2  . ranitidine (ZANTAC) 150 MG tablet Take 150 mg by mouth 2 (two) times daily.    . SYMBICORT 80-4.5 MCG/ACT inhaler INHALE 2 PUFFS BY MOUTH TWICE DAILY 10.2 g 5  . VICTOZA 18 MG/3ML SOPN ADMINISTER 1.8 MG UNDER THE SKIN DAILY 9 mL 5  . acetaminophen-codeine (TYLENOL #3) 300-30 MG tablet TAKE 1 TABLET BY MOUTH EVERY 8 HOURS AS NEEDED FOR PAIN (Patient not taking: Reported on 04/30/2016) 90 tablet 0   No current facility-administered medications on file prior to  visit.    BP 124/74 mmHg  Pulse 104  Temp(Src) 98.4 F (36.9 C) (Oral)  Ht 4' 10.25" (1.48 m)  Wt 219 lb 1.9 oz (99.392 kg)  BMI 45.38 kg/m2  SpO2 93%    Objective:   Physical Exam  Constitutional: She appears well-nourished.  Cardiovascular: Normal rate and regular rhythm.   Pulmonary/Chest: Effort normal. She has wheezes.  Abdominal: Soft. Bowel sounds are normal. There is no tenderness.  Skin: Skin is warm and dry.          Assessment & Plan:  Urinary tract infection:  Hematuria, dysuria, pelvic pressure 24-48 hours. Gross hematuria noted this morning. She is postmenopausal. UA: 3+ blood, 3+ leuks, negative nitrites. Culture sent. Given symptoms and presence of leukocytes and blood will treat for presumed infection. Prescription for Bactrim double strength tablets sent to pharmacy for a five-day course. Encouraged consumption of water, as she consumes a lot of soda. Discussed return precautions such as fevers, flank pain, weakness. Will await culture results.

## 2016-04-30 NOTE — Progress Notes (Signed)
Pre visit review using our clinic review tool, if applicable. No additional management support is needed unless otherwise documented below in the visit note. 

## 2016-05-01 DIAGNOSIS — M797 Fibromyalgia: Secondary | ICD-10-CM | POA: Diagnosis not present

## 2016-05-01 DIAGNOSIS — E785 Hyperlipidemia, unspecified: Secondary | ICD-10-CM | POA: Diagnosis not present

## 2016-05-01 DIAGNOSIS — Z87891 Personal history of nicotine dependence: Secondary | ICD-10-CM | POA: Diagnosis not present

## 2016-05-01 DIAGNOSIS — I10 Essential (primary) hypertension: Secondary | ICD-10-CM | POA: Diagnosis not present

## 2016-05-01 DIAGNOSIS — Z794 Long term (current) use of insulin: Secondary | ICD-10-CM | POA: Diagnosis not present

## 2016-05-01 DIAGNOSIS — G4730 Sleep apnea, unspecified: Secondary | ICD-10-CM | POA: Diagnosis not present

## 2016-05-01 DIAGNOSIS — Z7984 Long term (current) use of oral hypoglycemic drugs: Secondary | ICD-10-CM | POA: Diagnosis not present

## 2016-05-01 DIAGNOSIS — J45909 Unspecified asthma, uncomplicated: Secondary | ICD-10-CM | POA: Diagnosis not present

## 2016-05-01 DIAGNOSIS — K219 Gastro-esophageal reflux disease without esophagitis: Secondary | ICD-10-CM | POA: Diagnosis not present

## 2016-05-01 DIAGNOSIS — Z792 Long term (current) use of antibiotics: Secondary | ICD-10-CM | POA: Diagnosis not present

## 2016-05-01 DIAGNOSIS — E119 Type 2 diabetes mellitus without complications: Secondary | ICD-10-CM | POA: Diagnosis not present

## 2016-05-01 DIAGNOSIS — Z4789 Encounter for other orthopedic aftercare: Secondary | ICD-10-CM | POA: Diagnosis not present

## 2016-05-02 DIAGNOSIS — Z9889 Other specified postprocedural states: Secondary | ICD-10-CM | POA: Diagnosis not present

## 2016-05-03 DIAGNOSIS — K219 Gastro-esophageal reflux disease without esophagitis: Secondary | ICD-10-CM | POA: Diagnosis not present

## 2016-05-03 DIAGNOSIS — Z794 Long term (current) use of insulin: Secondary | ICD-10-CM | POA: Diagnosis not present

## 2016-05-03 DIAGNOSIS — M797 Fibromyalgia: Secondary | ICD-10-CM | POA: Diagnosis not present

## 2016-05-03 DIAGNOSIS — Z87891 Personal history of nicotine dependence: Secondary | ICD-10-CM | POA: Diagnosis not present

## 2016-05-03 DIAGNOSIS — Z792 Long term (current) use of antibiotics: Secondary | ICD-10-CM | POA: Diagnosis not present

## 2016-05-03 DIAGNOSIS — E785 Hyperlipidemia, unspecified: Secondary | ICD-10-CM | POA: Diagnosis not present

## 2016-05-03 DIAGNOSIS — I10 Essential (primary) hypertension: Secondary | ICD-10-CM | POA: Diagnosis not present

## 2016-05-03 DIAGNOSIS — J45909 Unspecified asthma, uncomplicated: Secondary | ICD-10-CM | POA: Diagnosis not present

## 2016-05-03 DIAGNOSIS — Z7984 Long term (current) use of oral hypoglycemic drugs: Secondary | ICD-10-CM | POA: Diagnosis not present

## 2016-05-03 DIAGNOSIS — G4730 Sleep apnea, unspecified: Secondary | ICD-10-CM | POA: Diagnosis not present

## 2016-05-03 DIAGNOSIS — Z4789 Encounter for other orthopedic aftercare: Secondary | ICD-10-CM | POA: Diagnosis not present

## 2016-05-03 DIAGNOSIS — E119 Type 2 diabetes mellitus without complications: Secondary | ICD-10-CM | POA: Diagnosis not present

## 2016-05-04 LAB — URINE CULTURE: Colony Count: >=100000

## 2016-05-14 ENCOUNTER — Other Ambulatory Visit: Payer: Self-pay | Admitting: Family Medicine

## 2016-05-14 NOTE — Telephone Encounter (Signed)
Last office 04/30/2016 with Allie Bossier.  Last refilled 04/16/2016 for #30 with no refills.  Ok to refill?

## 2016-05-14 NOTE — Telephone Encounter (Signed)
Last refilled 04/16/2016 for #90.  Ok to refill?

## 2016-05-14 NOTE — Telephone Encounter (Signed)
Pt left v/m requesting status of alprazolam rx.

## 2016-05-14 NOTE — Telephone Encounter (Signed)
Last office 04/30/2016 with Allie Bossier.  Last refilled 04/16/2016 for #90 with no refills.  Ok to refill?

## 2016-05-15 NOTE — Telephone Encounter (Signed)
Alprazolam called in to [facility] on [address]. 

## 2016-05-29 DIAGNOSIS — M545 Low back pain: Secondary | ICD-10-CM | POA: Diagnosis not present

## 2016-06-11 ENCOUNTER — Other Ambulatory Visit: Payer: Self-pay | Admitting: Family Medicine

## 2016-06-13 ENCOUNTER — Telehealth: Payer: Self-pay | Admitting: Family Medicine

## 2016-06-13 ENCOUNTER — Other Ambulatory Visit: Payer: Self-pay | Admitting: Family Medicine

## 2016-06-13 NOTE — Telephone Encounter (Signed)
Please send via an appropriate refill request documentation through [facility] to Dr. [name].

## 2016-06-13 NOTE — Telephone Encounter (Signed)
Pt states she called in her refill for Xanax on Monday and hasn't gotten the med filled yet.  She called it in to [facility] on [address].  Please call patient.  [phone]

## 2016-06-14 NOTE — Telephone Encounter (Signed)
Pt called cking on status of alprazolam refill; spoke with Amy at [facility] and does not have refill called in yet. Medication phoned to Amy at [facility] pharmacy as instructed. Pt voiced understanding and will ck with pharmacy for pick up later today.

## 2016-06-14 NOTE — Telephone Encounter (Signed)
Per chart medication called in to pharmacy today per [facility]. See additional refill request/phone note

## 2016-07-09 ENCOUNTER — Other Ambulatory Visit: Payer: Self-pay | Admitting: Family Medicine

## 2016-07-09 NOTE — Telephone Encounter (Signed)
Last office visit 04/30/16 with [facility] for UTI.  Last refilled 06/12/16.  Ok to refill?

## 2016-07-10 DIAGNOSIS — M4316 Spondylolisthesis, lumbar region: Secondary | ICD-10-CM | POA: Diagnosis not present

## 2016-07-10 NOTE — Telephone Encounter (Signed)
Alprazolam called in to [facility] on [address]. 

## 2016-07-24 DIAGNOSIS — M545 Low back pain: Secondary | ICD-10-CM | POA: Diagnosis not present

## 2016-07-24 DIAGNOSIS — Z4789 Encounter for other orthopedic aftercare: Secondary | ICD-10-CM | POA: Diagnosis not present

## 2016-07-24 DIAGNOSIS — M4326 Fusion of spine, lumbar region: Secondary | ICD-10-CM | POA: Diagnosis not present

## 2016-07-31 ENCOUNTER — Other Ambulatory Visit: Payer: Self-pay | Admitting: Family Medicine

## 2016-08-07 ENCOUNTER — Other Ambulatory Visit: Payer: Self-pay | Admitting: Family Medicine

## 2016-08-07 NOTE — Telephone Encounter (Signed)
Last office visit 04/30/2016 with Gentry Fitz.  Last refilled 07/09/2016 for #90 with no refills.  Ok to refill?

## 2016-08-07 NOTE — Telephone Encounter (Signed)
Alprazolam called in to [facility] on [address]. 

## 2016-08-07 NOTE — Telephone Encounter (Signed)
Last office visit 04/30/2016 with Gentry Fitz.  Last refilled 06/12/2016 for #90 with no refills.  Ok to refill?

## 2016-08-09 DIAGNOSIS — M4326 Fusion of spine, lumbar region: Secondary | ICD-10-CM | POA: Diagnosis not present

## 2016-08-09 DIAGNOSIS — Z4789 Encounter for other orthopedic aftercare: Secondary | ICD-10-CM | POA: Diagnosis not present

## 2016-08-09 DIAGNOSIS — M545 Low back pain: Secondary | ICD-10-CM | POA: Diagnosis not present

## 2016-08-16 DIAGNOSIS — M545 Low back pain: Secondary | ICD-10-CM | POA: Diagnosis not present

## 2016-08-16 DIAGNOSIS — Z4789 Encounter for other orthopedic aftercare: Secondary | ICD-10-CM | POA: Diagnosis not present

## 2016-08-16 DIAGNOSIS — M4326 Fusion of spine, lumbar region: Secondary | ICD-10-CM | POA: Diagnosis not present

## 2016-08-20 DIAGNOSIS — M545 Low back pain: Secondary | ICD-10-CM | POA: Diagnosis not present

## 2016-08-20 DIAGNOSIS — Z4789 Encounter for other orthopedic aftercare: Secondary | ICD-10-CM | POA: Diagnosis not present

## 2016-08-20 DIAGNOSIS — M4326 Fusion of spine, lumbar region: Secondary | ICD-10-CM | POA: Diagnosis not present

## 2016-08-21 DIAGNOSIS — M545 Low back pain: Secondary | ICD-10-CM | POA: Diagnosis not present

## 2016-09-03 ENCOUNTER — Other Ambulatory Visit: Payer: Self-pay | Admitting: Family Medicine

## 2016-09-03 DIAGNOSIS — Z4789 Encounter for other orthopedic aftercare: Secondary | ICD-10-CM | POA: Diagnosis not present

## 2016-09-03 DIAGNOSIS — M4326 Fusion of spine, lumbar region: Secondary | ICD-10-CM | POA: Diagnosis not present

## 2016-09-03 DIAGNOSIS — M545 Low back pain: Secondary | ICD-10-CM | POA: Diagnosis not present

## 2016-09-03 NOTE — Telephone Encounter (Signed)
Last office visit 04/30/2016 with Gentry Fitz.   Last refilled 08/07/2016 for #90 with no refills.  Ok to refill?

## 2016-09-03 NOTE — Telephone Encounter (Signed)
Alprazolam called in to [facility] on [address]. 

## 2016-09-04 ENCOUNTER — Other Ambulatory Visit: Payer: Self-pay | Admitting: Family Medicine

## 2016-09-10 ENCOUNTER — Telehealth: Payer: Self-pay | Admitting: *Deleted

## 2016-09-10 NOTE — Telephone Encounter (Signed)
Patient left a voicemail stating that she is finished with her post-op visits with [employer]. Patient stated that she does not need refills now, but when her Tylenol #3 and Robaxin needs refilled Dr. [name] will need to do it in the future. Patient stated that she will let you know when she needs them refilled.

## 2016-09-11 NOTE — Telephone Encounter (Signed)
Noted  

## 2016-09-17 ENCOUNTER — Other Ambulatory Visit: Payer: Self-pay | Admitting: *Deleted

## 2016-09-17 NOTE — Telephone Encounter (Signed)
Patient left a voicemail requesting a refill on her methocarbamol Last refill 02/21/16 #90 Last office visit 04/30/16-acute

## 2016-09-18 ENCOUNTER — Other Ambulatory Visit: Payer: Self-pay | Admitting: Family Medicine

## 2016-09-18 MED ORDER — METHOCARBAMOL 500 MG PO TABS: ORAL_TABLET | ORAL | 0 refills | Status: DC

## 2016-09-18 NOTE — Telephone Encounter (Signed)
Pt request status of methocarbamol and pen needle refills; advised pt already sent. Pt voiced understanding.

## 2016-09-20 ENCOUNTER — Telehealth: Payer: Self-pay | Admitting: Family Medicine

## 2016-09-20 DIAGNOSIS — E785 Hyperlipidemia, unspecified: Secondary | ICD-10-CM

## 2016-09-20 DIAGNOSIS — E119 Type 2 diabetes mellitus without complications: Secondary | ICD-10-CM

## 2016-09-20 NOTE — Telephone Encounter (Signed)
-----   Message from Ellamae Sia sent at 09/19/2016 10:25 AM EST ----- Regarding: Lab orders for 11.20.17 Patient is scheduled for CPX labs, please order future labs, Thanks , Karna Christmas

## 2016-09-24 ENCOUNTER — Encounter: Payer: Self-pay | Admitting: Family Medicine

## 2016-09-24 ENCOUNTER — Ambulatory Visit (INDEPENDENT_AMBULATORY_CARE_PROVIDER_SITE_OTHER): Payer: Medicare Other

## 2016-09-24 ENCOUNTER — Encounter: Payer: Self-pay | Admitting: Radiology

## 2016-09-24 ENCOUNTER — Other Ambulatory Visit: Payer: Self-pay | Admitting: Family Medicine

## 2016-09-24 ENCOUNTER — Other Ambulatory Visit: Payer: Medicare Other

## 2016-09-24 VITALS — BP 118/70 | HR 83 | Temp 98.5°F | Ht <= 58 in | Wt 227.0 lb

## 2016-09-24 DIAGNOSIS — Z0000 Encounter for general adult medical examination without abnormal findings: Secondary | ICD-10-CM | POA: Diagnosis not present

## 2016-09-24 DIAGNOSIS — E119 Type 2 diabetes mellitus without complications: Secondary | ICD-10-CM

## 2016-09-24 DIAGNOSIS — E785 Hyperlipidemia, unspecified: Secondary | ICD-10-CM | POA: Diagnosis not present

## 2016-09-24 DIAGNOSIS — Z79899 Other long term (current) drug therapy: Secondary | ICD-10-CM | POA: Diagnosis not present

## 2016-09-24 DIAGNOSIS — Z79891 Long term (current) use of opiate analgesic: Secondary | ICD-10-CM | POA: Diagnosis not present

## 2016-09-24 LAB — COMPREHENSIVE METABOLIC PANEL
ALBUMIN: 3.9 g/dL (ref 3.5–5.2)
ALK PHOS: 86 U/L (ref 39–117)
ALT: 16 U/L (ref 0–35)
AST: 21 U/L (ref 0–37)
BUN: 13 mg/dL (ref 6–23)
CALCIUM: 9.4 mg/dL (ref 8.4–10.5)
CO2: 29 mEq/L (ref 19–32)
CREATININE: 0.68 mg/dL (ref 0.40–1.20)
Chloride: 101 mEq/L (ref 96–112)
GFR: 95 mL/min (ref >60.00)
Glucose, Bld: 122 mg/dL — ABNORMAL HIGH (ref 70–99)
POTASSIUM: 4.1 meq/L (ref 3.5–5.1)
SODIUM: 137 meq/L (ref 135–145)
TOTAL PROTEIN: 7.1 g/dL (ref 6.0–8.3)
Total Bilirubin: 0.3 mg/dL (ref 0.2–1.2)

## 2016-09-24 LAB — MICROALBUMIN / CREATININE URINE RATIO
Creatinine,U: 70.4 mg/dL
MICROALB UR: 3.1 mg/dL — AB (ref 0.0–1.9)
MICROALB/CREAT RATIO: 4.4 mg/g (ref 0.0–30.0)

## 2016-09-24 LAB — LIPID PANEL
Cholesterol: 154 mg/dL (ref 0–200)
HDL: 56.4 mg/dL (ref >39.00)
LDL Cholesterol: 83 mg/dL (ref 0–99)
NONHDL: 97.48
Total CHOL/HDL Ratio: 3
Triglycerides: 73 mg/dL (ref 0.0–149.0)
VLDL: 14.6 mg/dL (ref 0.0–40.0)

## 2016-09-24 LAB — HEMOGLOBIN A1C: HEMOGLOBIN A1C: 7.1 % — AB (ref 4.6–6.5)

## 2016-09-24 NOTE — Patient Instructions (Signed)
Sandra Ferrell , Thank you for taking time to come for your Medicare Wellness Visit. I appreciate your ongoing commitment to your health goals. Please review the following plan we discussed and let me know if I can assist you in the future.   These are the goals we discussed: Goals    . Increase physical activity          Starting 09/24/16, I will continue to walk and do stretching for at least 30 min 3 days per week.        This is a list of the screening recommended for you and due dates:  Health Maintenance  Topic Date Due  . Flu Shot  02/02/2017*  . Eye exam for diabetics  05/28/2017*  . HIV Screening  09/24/2017*  . Pneumococcal vaccine (2) 10/02/2020*  . Complete foot exam   03/22/2017  . Hemoglobin A1C  03/24/2017  . Pap Smear  06/22/2017  . Mammogram  08/08/2017  . Urine Protein Check  09/24/2017  . Colon Cancer Screening  09/10/2021  . Tetanus Vaccine  06/22/2024  .  Hepatitis C: One time screening is recommended by Center for Disease Control  (CDC) for  adults born from [birth year] through 1966.   Completed  *Topic was postponed. The date shown is not the original due date.   Preventive Care for Adults  A healthy lifestyle and preventive care can promote health and wellness. Preventive health guidelines for adults include the following key practices.  . A routine yearly physical is a good way to check with your health care provider about your health and preventive screening. It is a chance to share any concerns and updates on your health and to receive a thorough exam.  . Visit your dentist for a routine exam and preventive care every 6 months. Brush your teeth twice a day and floss once a day. Good oral hygiene prevents tooth decay and gum disease.  . The frequency of eye exams is based on your age, health, family medical history, use  of contact lenses, and other factors. Follow your health care provider's ecommendations for frequency of eye exams.  . Eat a healthy diet.  Foods like vegetables, fruits, whole grains, low-fat dairy products, and lean protein foods contain the nutrients you need without too many calories. Decrease your intake of foods high in solid fats, added sugars, and salt. Eat the right amount of calories for you. Get information about a proper diet from your health care provider, if necessary.  . Regular physical exercise is one of the most important things you can do for your health. Most adults should get at least 150 minutes of moderate-intensity exercise (any activity that increases your heart rate and causes you to sweat) each week. In addition, most adults need muscle-strengthening exercises on 2 or more days a week.  Silver Sneakers may be a benefit available to you. To determine eligibility, you may visit the website: www.silversneakers.com or contact program at 2165776958 Mon-Fri between 8AM-8PM.   . Maintain a healthy weight. The body mass index (BMI) is a screening tool to identify possible weight problems. It provides an estimate of body fat based on height and weight. Your health care provider can find your BMI and can help you achieve or maintain a healthy weight.   For adults 20 years and older: ? A BMI below 18.5 is considered underweight. ? A BMI of 18.5 to 24.9 is normal. ? A BMI of 25 to 29.9 is considered overweight. ?  A BMI of 30 and above is considered obese.   . Maintain normal blood lipids and cholesterol levels by exercising and minimizing your intake of saturated fat. Eat a balanced diet with plenty of fruit and vegetables. Blood tests for lipids and cholesterol should begin at age 44 and be repeated every 5 years. If your lipid or cholesterol levels are high, you are over 50, or you are at high risk for heart disease, you may need your cholesterol levels checked more frequently. Ongoing high lipid and cholesterol levels should be treated with medicines if diet and exercise are not working.  . If you smoke, find out  from your health care provider how to quit. If you do not use tobacco, please do not start.  . If you choose to drink alcohol, please do not consume more than 2 drinks per day. One drink is considered to be 12 ounces (355 mL) of beer, 5 ounces (148 mL) of wine, or 1.5 ounces (44 mL) of liquor.  . If you are 56-61 years old, ask your health care provider if you should take aspirin to prevent strokes.  . Use sunscreen. Apply sunscreen liberally and repeatedly throughout the day. You should seek shade when your shadow is shorter than you. Protect yourself by wearing long sleeves, pants, a wide-brimmed hat, and sunglasses year round, whenever you are outdoors.  . Once a month, do a whole body skin exam, using a mirror to look at the skin on your back. Tell your health care provider of new moles, moles that have irregular borders, moles that are larger than a pencil eraser, or moles that have changed in shape or color.

## 2016-09-24 NOTE — Progress Notes (Signed)
Pre visit review using our clinic review tool, if applicable. No additional management support is needed unless otherwise documented below in the visit note. 

## 2016-09-24 NOTE — Progress Notes (Signed)
Subjective:   Sandra Ferrell is a 55 y.o. female who presents for Medicare Annual (Subsequent) preventive examination.  Review of Systems:  N/A Cardiac Risk Factors include: diabetes mellitus;dyslipidemia;hypertension;obesity (BMI >30kg/m2);smoking/ tobacco exposure     Objective:     Vitals: BP 118/70 (BP Location: Left Arm, Patient Position: Sitting, Cuff Size: Normal)   Pulse 83   Temp 98.5 F (36.9 C) (Oral)   Ht _0  (1.473 m) Comment: no shoes  Wt 227 lb (103 kg)   SpO2 98%   BMI 47.44 kg/m   Body mass index is 47.44 kg/m.   Tobacco History  Smoking Status  . Current Every Day Smoker  . Packs/day: 1.00  . Years: 35.00  . Types: Cigarettes  Smokeless Tobacco  . Never Used     Ready to quit: No Counseling given: No   Past Medical History:  Diagnosis Date  . Allergic rhinitis, cause unspecified   . Anxiety state, unspecified   . Backache, unspecified   . Calculus of kidney   . Carpal tunnel syndrome, right   . Depressive disorder, not elsewhere classified    managed with medications  . Esophageal reflux   . Extrinsic asthma with exacerbation   . Fibromyalgia   . Heart murmur   . Hypertension   . Osteoarthrosis, unspecified whether generalized or localized, unspecified site   . Other and unspecified hyperlipidemia   . Other screening mammogram   . Personal history of unspecified urinary disorder   . Pneumonia   . Routine general medical examination at a health care facility   . Routine gynecological examination   . Tobacco use disorder   . Type II or unspecified type diabetes mellitus without mention of complication, not stated as uncontrolled   . Unspecified sleep apnea   . Wears glasses    Past Surgical History:  Procedure Laterality Date  . ANTERIOR CERVICAL DECOMP/DISCECTOMY FUSION N/A 01/19/2015   Procedure: ANTERIOR CERVICAL DECOMPRESSION/DISCECTOMY FUSION 2 LEVELS;  Surgeon: Phylliss Bob, MD;  Location: [facility];  Service: Orthopedics;   Laterality: N/A;  Anterior cervical decompression fusion, cervical 5-6, cervical 6-7 with instrumentation and allograft  . APPENDECTOMY  1973  . CHOLECYSTECTOMY  08/2006  . COLONOSCOPY    . TRANSTHORACIC ECHOCARDIOGRAM  02/2011   mild LVH, nl EF, mild diastolic dysfunction, no wall motion abnl   Family History  Problem Relation Age of Onset  . Stroke Father   . Colon cancer Cousin    History  Sexual Activity  . Sexual activity: No    Outpatient Encounter Prescriptions as of 09/24/2016  Medication Sig  . ACCU-CHEK AVIVA PLUS test strip Check blood sugar twice a day and as directed. Dx E11.9  . acetaminophen-codeine (TYLENOL #3) 300-30 MG tablet TAKE 1 TABLET BY MOUTH EVERY 8 HOURS AS NEEDED FOR PAIN  . AIMSCO INSULIN SYR ULTRA THIN 31G X 5/16" 0.3 ML MISC   . Alcohol Swabs PADS Check blood sugar twice a day and as directed. Dx E11.9  . ALPRAZolam (XANAX) 0.5 MG tablet TAKE 1 TABLET BY MOUTH THREE TIMES DAILY  . amitriptyline (ELAVIL) 50 MG tablet TAKE 1 TABLET(50 MG) BY MOUTH AT BEDTIME  . aspirin EC 81 MG tablet Take 81 mg by mouth daily.  . B-D ULTRAFINE III SHORT PEN 31G X 8 MM MISC USE AS DIRECTED WITH INSULIN PEN  . Blood Glucose Monitoring Suppl (ACCU-CHEK AVIVA PLUS) W/DEVICE KIT   . gabapentin (NEURONTIN) 300 MG capsule TAKE 1 CAPSULE(300 MG) BY MOUTH  THREE TIMES DAILY  . gemfibrozil (LOPID) 600 MG tablet TAKE 1 TABLET BY MOUTH TWICE DAILY  . Homeopathic Products ([facility]) CREA Apply 1 application topically as needed (Apply to ankles for pain.).   Marland Kitchen hydrochlorothiazide (HYDRODIURIL) 25 MG tablet TAKE 1 TABLET BY MOUTH EVERY DAY  . Lancet Devices (ADJUSTABLE LANCING DEVICE) MISC Check blood sugar twice a day and as directed. Dx E11.9  . LANTUS SOLOSTAR 100 UNIT/ML Solostar Pen ADMINISTER 30 UNITS UNDER THE SKIN AT BEDTIME  . metFORMIN (GLUCOPHAGE) 1000 MG tablet TAKE 1 TABLET(1000 MG) BY MOUTH TWICE DAILY WITH A MEAL  . methocarbamol (ROBAXIN) 500 MG tablet TAKE TWO  TABLETS (1000MG) BY MOUTH AT BEDTIME.  . pioglitazone (ACTOS) 45 MG tablet TAKE 1 TABLET BY MOUTH EVERY DAY  . PROAIR HFA 108 (90 Base) MCG/ACT inhaler INHALE 2 PUFFS BY MOUTH EVERY 4 HOURS AS NEEDED FOR WHEEZING OR SHORTNESS OF BREATH  . ranitidine (ZANTAC) 150 MG tablet Take 150 mg by mouth 2 (two) times daily.  . SYMBICORT 80-4.5 MCG/ACT inhaler INHALE 2 PUFFS BY MOUTH TWICE DAILY  . VICTOZA 18 MG/3ML SOPN ADMINISTER 1.8 MG UNDER THE SKIN DAILY  . [DISCONTINUED] diazepam (VALIUM) 5 MG tablet Reported on 04/30/2016  . [DISCONTINUED] oxyCODONE-acetaminophen (PERCOCET/ROXICET) 5-325 MG tablet 1 tablet every 6 (six) hours as needed. Reported on 04/30/2016  . [DISCONTINUED] sulfamethoxazole-trimethoprim (BACTRIM DS,SEPTRA DS) 800-160 MG tablet Take 1 tablet by mouth 2 (two) times daily.   No facility-administered encounter medications on file as of 09/24/2016.     Activities of Daily Living In your present state of health, do you have any difficulty performing the following activities: 09/24/2016 04/17/2016  Hearing? N N  Vision? N N  Difficulty concentrating or making decisions? N N  Walking or climbing stairs? N Y  Dressing or bathing? N N  Doing errands, shopping? Y N  Preparing Food and eating ? N -  Using the Toilet? N -  In the past six months, have you accidently leaked urine? N -  Do you have problems with loss of bowel control? N -  Managing your Medications? N -  Managing your Finances? N -  Housekeeping or managing your Housekeeping? N -  Some recent data might be hidden    Patient Care Team: Jinny Sanders, MD as PCP - General (Family Medicine) Phylliss Bob, MD as Consulting Physician (Orthopedic Surgery)    Assessment:     Hearing Screening   _0  _1  _2  _3  _4  _5  _6  _7  _8   Right ear:   40 40 40  40    Left ear:   40 40 40  40      Visual Acuity Screening   Right eye Left eye Both eyes  Without correction:     With correction: 20/20  20/15-1 20/15-1    Exercise Activities and Dietary recommendations Current Exercise Habits: Home exercise routine, Type of exercise: stretching;walking, Time (Minutes): 30, Frequency (Times/Week): 3, Weekly Exercise (Minutes/Week): 90, Intensity: Mild, Exercise limited by: None identified  Goals    . Increase physical activity          Starting 09/24/16, I will continue to walk and do stretching for at least 30 min 3 days per week.       Fall Risk Fall Risk  09/24/2016 09/13/2015  Falls in the past year? Yes Yes  Number falls in past yr: 2 or more -  Injury with Fall? Yes No  Risk Factor Category  High Fall Risk -  Risk for fall due to : Impaired balance/gait -  Follow up Falls evaluation completed;Falls prevention discussed -   Depression Screen PHQ 2/9 Scores 09/24/2016 09/13/2015 02/15/2015  PHQ - 2 Score _0 PHQ- 9 Score 17 - -     Cognitive Function MMSE - Mini Mental State Exam 09/24/2016  Orientation to time 5  Orientation to Place 5  Registration 3  Attention/ Calculation 0  Recall 3  Language- name 2 objects 0  Language- repeat 1  Language- follow 3 step command 3  Language- read & follow direction 0  Write a sentence 0  Copy design 0  Total score 20       PLEASE NOTE: A Mini-Cog screen was completed. Maximum score is 20. A value of 0 denotes this part of Folstein MMSE was not completed or the patient failed this part of the Mini-Cog screening.   Mini-Cog Screening Orientation to Time - Max 5 pts Orientation to Place - Max 5 pts Registration - Max 3 pts Recall - Max 3 pts Language Repeat - Max 1 pts Language Follow 3 Step Command - Max 3 pts   Immunization History  Administered Date(s) Administered  . Influenza Whole 09/19/2007, 09/06/2008, 09/22/2009, 10/03/2010  . Influenza,inj,Quad PF,36+ Mos 08/28/2013, 09/13/2015  . Pneumococcal Polysaccharide-23 10/03/2010  . Td 11/05/2002  . Tdap 06/22/2014  . Zoster 08/07/2015   Screening Tests Health  Maintenance  Topic Date Due  . INFLUENZA VACCINE  02/02/2017 (Originally 06/05/2016)  . OPHTHALMOLOGY EXAM  05/28/2017 (Originally 05/29/2016)  . HIV Screening  09/24/2017 (Originally [birth year]1975)  . PNEUMOCOCCAL POLYSACCHARIDE VACCINE (2) 10/02/2020 (Originally 10/04/2015)  . FOOT EXAM  03/22/2017  . HEMOGLOBIN A1C  03/24/2017  . PAP SMEAR  06/22/2017  . MAMMOGRAM  08/08/2017  . URINE MICROALBUMIN  09/24/2017  . COLONOSCOPY  09/10/2021  . TETANUS/TDAP  06/22/2024  . Hepatitis C Screening  Completed      Plan:     I have personally reviewed and addressed the Medicare Annual Wellness questionnaire and have noted the following in the patient's chart:  A. Medical and social history B. Use of alcohol, tobacco or illicit drugs  C. Current medications and supplements D. Functional ability and status E.  Nutritional status F.  Physical activity G. Advance directives H. List of other physicians I.  Hospitalizations, surgeries, and ER visits in previous 12 months J.  [place] to include hearing, vision, cognitive, depression L. Referrals and appointments - none  In addition, I have reviewed and discussed with patient certain preventive protocols, quality metrics, and best practice recommendations. A written personalized care plan for preventive services as well as general preventive health recommendations were provided to patient.  See attached scanned questionnaire for additional information.   Signed,   Lindell Noe, MHA, BS, LPN Health Coach
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
## 2016-09-25 NOTE — Progress Notes (Signed)
I reviewed health advisor's note, was available for consultation, and agree with documentation and plan.   Signed,  Lorien Shingler T. Arlet Marter, MD  

## 2016-10-01 ENCOUNTER — Other Ambulatory Visit: Payer: Self-pay | Admitting: Family Medicine

## 2016-10-01 ENCOUNTER — Ambulatory Visit (INDEPENDENT_AMBULATORY_CARE_PROVIDER_SITE_OTHER): Payer: Medicare Other | Admitting: Family Medicine

## 2016-10-01 ENCOUNTER — Encounter: Payer: Self-pay | Admitting: Family Medicine

## 2016-10-01 VITALS — BP 144/60 | HR 100 | Temp 98.5°F | Ht <= 58 in | Wt 226.0 lb

## 2016-10-01 DIAGNOSIS — F3341 Major depressive disorder, recurrent, in partial remission: Secondary | ICD-10-CM

## 2016-10-01 DIAGNOSIS — I10 Essential (primary) hypertension: Secondary | ICD-10-CM

## 2016-10-01 DIAGNOSIS — M549 Dorsalgia, unspecified: Secondary | ICD-10-CM

## 2016-10-01 DIAGNOSIS — E119 Type 2 diabetes mellitus without complications: Secondary | ICD-10-CM

## 2016-10-01 DIAGNOSIS — G8929 Other chronic pain: Secondary | ICD-10-CM

## 2016-10-01 DIAGNOSIS — E785 Hyperlipidemia, unspecified: Secondary | ICD-10-CM | POA: Diagnosis not present

## 2016-10-01 DIAGNOSIS — J449 Chronic obstructive pulmonary disease, unspecified: Secondary | ICD-10-CM

## 2016-10-01 DIAGNOSIS — J441 Chronic obstructive pulmonary disease with (acute) exacerbation: Secondary | ICD-10-CM | POA: Insufficient documentation

## 2016-10-01 MED ORDER — AZITHROMYCIN 250 MG PO TABS: ORAL_TABLET | ORAL | 0 refills | Status: DC

## 2016-10-01 NOTE — Patient Instructions (Addendum)
Continue to work on low carb diet and increase exercise as tolerated.  We will plan to wean off the tylenol as able.. Down to 2 a day as you can.   Try to decrease gabapentin to two times daily if able.  Set up yearly eye exam. Return for flu and pneumonia vaccine when able , once illness resolved.  Quit smoking.

## 2016-10-01 NOTE — Assessment & Plan Note (Signed)
Improving control. Work on lifestyle changes and can increase victoza back up for weight loss.

## 2016-10-01 NOTE — Assessment & Plan Note (Signed)
LDL low almost < 70, pt high risk but intolerant of statins in past.  On gemfibrozil.

## 2016-10-01 NOTE — Assessment & Plan Note (Signed)
Well controlled. Continue current medication.  

## 2016-10-01 NOTE — Addendum Note (Signed)
Addended by: Eliezer Lofts E on: 10/01/2016 11:39 AM   Modules accepted: Orders

## 2016-10-01 NOTE — Progress Notes (Signed)
Pre visit review using our clinic review tool, if applicable. No additional management support is needed unless otherwise documented below in the visit note. 

## 2016-10-01 NOTE — Assessment & Plan Note (Signed)
Change in mucus suggest bacterial infection.. Treat with azithro. Hold of on prednisone unless not improving.

## 2016-10-01 NOTE — Assessment & Plan Note (Signed)
Well controlled usually. Follow at home. Continue current medication.
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
## 2016-10-01 NOTE — Assessment & Plan Note (Signed)
Improved cfter procedure. Try to wean down gabapentin, muscle relaxant and tylenol 3 as able.

## 2016-10-01 NOTE — Telephone Encounter (Signed)
Last office visit 09/24/2016 for Sandra Ferrell.  Last refilled 09/03/2016.  Has appointment today with Dr. [name].  Ok to refill?

## 2016-10-01 NOTE — Assessment & Plan Note (Addendum)
Stable control on symbicort except for current exacerbation.  Smoking cessation instruction/counseling given:  counseled patient on the dangers of tobacco use, advised patient to stop smoking, and reviewed strategies to maximize success

## 2016-10-01 NOTE — Telephone Encounter (Signed)
Alprazolam called into [facility] - [place], [place] - [address] Phone: [phone] 

## 2016-10-02 ENCOUNTER — Other Ambulatory Visit: Payer: Self-pay | Admitting: Family Medicine

## 2016-10-09 ENCOUNTER — Telehealth: Payer: Self-pay | Admitting: Family Medicine

## 2016-10-09 ENCOUNTER — Telehealth: Payer: Self-pay

## 2016-10-09 MED ORDER — PREDNISONE 20 MG PO TABS: ORAL_TABLET | ORAL | 0 refills | Status: DC

## 2016-10-09 NOTE — Telephone Encounter (Signed)
Pt left v/m; pt was seen 10/01/16 and given Zpak; pt is still sick;wheezing and prod cough with light brown phlegm and SOB when walking; no CP or fever and wants to know if can have refill on med or what to do. Pt not in distress. Advised pt zpak stays in system 5-7 days after finishes med. Pt wants to know what else can be done. [facility].

## 2016-10-09 NOTE — Telephone Encounter (Signed)
Opened in error

## 2016-10-09 NOTE — Telephone Encounter (Signed)
Antibiotics still in system but with SOB and wheeze.Marland Kitchen Pt likely needs pred taper. Sent in rx. Notify pt. If not improving will need follow up.

## 2016-10-09 NOTE — Telephone Encounter (Signed)
Spoke to pt

## 2016-10-15 ENCOUNTER — Other Ambulatory Visit: Payer: Self-pay | Admitting: Family Medicine

## 2016-10-16 NOTE — Telephone Encounter (Signed)
Last office visit  10/01/2016.  Last refilled 09/18/2016 for #90 with no refills.  Ok to refill?

## 2016-10-28 ENCOUNTER — Other Ambulatory Visit: Payer: Self-pay | Admitting: Family Medicine

## 2016-10-30 NOTE — Telephone Encounter (Signed)
Pt left v/m requesting refills gabapentin (last filled # 90 on 10/01/16), alprazolam (last filled # 90 on 10/01/16) and Tylenol # 3 (last filled # 90 on 04/26/16.) pt last seen 10/01/16.

## 2016-10-30 NOTE — Telephone Encounter (Signed)
Ok to refill  Gabapentin 90, 0 ref  Xanax 90, 0 ref  Tylenol #3, 90, 0 ref

## 2016-10-30 NOTE — Telephone Encounter (Signed)
Pt said she is running low on meds and request sent to provider in office. [facility].

## 2016-10-31 MED ORDER — ACETAMINOPHEN-CODEINE #3 300-30 MG PO TABS: 1.0000 | ORAL_TABLET | Freq: Three times a day (TID) | ORAL | 0 refills | Status: DC | PRN

## 2016-10-31 NOTE — Telephone Encounter (Signed)
Called in medication to the [facility] as instructed.

## 2016-11-08 ENCOUNTER — Other Ambulatory Visit: Payer: Self-pay | Admitting: Family Medicine

## 2016-11-20 ENCOUNTER — Other Ambulatory Visit: Payer: Self-pay | Admitting: Family Medicine

## 2016-11-20 NOTE — Telephone Encounter (Signed)
Last office visit 10/01/2016.  Last refilled 10/16/2016 for #90 with no refills.

## 2016-11-27 ENCOUNTER — Telehealth: Payer: Self-pay | Admitting: Family Medicine

## 2016-11-27 MED ORDER — ALPRAZOLAM 0.5 MG PO TABS: 0.5000 mg | ORAL_TABLET | Freq: Three times a day (TID) | ORAL | 0 refills | Status: DC | PRN

## 2016-11-27 NOTE — Telephone Encounter (Addendum)
Left message for [facility] that Alprazolam should be one tablet three times a day.  No record of medication dose being decreased.  Not sure why the refill request that was sent to [place] today from [facility] stated two times a day.

## 2016-11-27 NOTE — Telephone Encounter (Signed)
Last office visit 10/01/2016.  Ok to refill?

## 2016-11-27 NOTE — Telephone Encounter (Signed)
Alprazolam & Tylenol #3 called into [facility], [place] Phone: [phone]

## 2016-11-27 NOTE — Telephone Encounter (Signed)
Agree , can change to alprazolam BID

## 2016-11-27 NOTE — Telephone Encounter (Signed)
[facility] left v/m requesting cb about change in directions for alprazolam 0.5mg  to taking one tab bid. Previously instructions for alprazolam 0.5 mg were take one tab tid. Please advise.

## 2016-11-27 NOTE — Addendum Note (Signed)
Addended by: Carter Kitten on: 11/27/2016 05:42 PM   Modules accepted: Orders

## 2016-12-07 ENCOUNTER — Other Ambulatory Visit: Payer: Self-pay | Admitting: Family Medicine

## 2016-12-12 ENCOUNTER — Ambulatory Visit (INDEPENDENT_AMBULATORY_CARE_PROVIDER_SITE_OTHER): Payer: Medicare Other | Admitting: Internal Medicine

## 2016-12-12 ENCOUNTER — Encounter: Payer: Self-pay | Admitting: Internal Medicine

## 2016-12-12 VITALS — BP 130/74 | HR 94 | Temp 98.6°F | Wt 235.0 lb

## 2016-12-12 DIAGNOSIS — M545 Low back pain, unspecified: Secondary | ICD-10-CM

## 2016-12-12 DIAGNOSIS — R600 Localized edema: Secondary | ICD-10-CM | POA: Diagnosis not present

## 2016-12-12 DIAGNOSIS — G8929 Other chronic pain: Secondary | ICD-10-CM

## 2016-12-12 DIAGNOSIS — R1030 Lower abdominal pain, unspecified: Secondary | ICD-10-CM

## 2016-12-12 DIAGNOSIS — M797 Fibromyalgia: Secondary | ICD-10-CM

## 2016-12-12 LAB — POC URINALSYSI DIPSTICK (AUTOMATED)
Bilirubin, UA: NEGATIVE
GLUCOSE UA: NEGATIVE
Ketones, UA: NEGATIVE
Leukocytes, UA: NEGATIVE
Nitrite, UA: NEGATIVE
PH UA: 6.5
Protein, UA: NEGATIVE
RBC UA: NEGATIVE
SPEC GRAV UA: 1.02
UROBILINOGEN UA: NEGATIVE

## 2016-12-12 MED ORDER — FUROSEMIDE 20 MG PO TABS: 20.0000 mg | ORAL_TABLET | Freq: Every day | ORAL | 0 refills | Status: DC

## 2016-12-12 NOTE — Patient Instructions (Signed)
Edema  Edema is an abnormal buildup of fluids. It is more common in your legs and thighs. Painless swelling of the feet and ankles is more likely as a person ages. It also is common in looser skin, like around your eyes.  Follow these instructions at home:  ? Keep the affected body part above the level of the heart while lying down.  ? Do not sit still or stand for a long time.  ? Do not put anything right under your knees when you lie down.  ? Do not wear tight clothes on your upper legs.  ? Exercise your legs to help the puffiness (swelling) go down.  ? Wear elastic bandages or support stockings as told by your doctor.  ? A low-salt diet may help lessen the puffiness.  ? Only take medicine as told by your doctor.  Contact a doctor if:  ? Treatment is not working.  ? You have heart, liver, or kidney disease and notice that your skin looks puffy or shiny.  ? You have puffiness in your legs that does not get better when you raise your legs.  ? You have sudden weight gain for no reason.  Get help right away if:  ? You have shortness of breath or chest pain.  ? You cannot breathe when you lie down.  ? You have pain, redness, or warmth in the areas that are puffy.  ? You have heart, liver, or kidney disease and get edema all of a sudden.  ? You have a fever and your symptoms get worse all of a sudden.  This information is not intended to replace advice given to you by your health care provider. Make sure you discuss any questions you have with your health care provider.  Document Released: 04/09/2008 Document Revised: 03/29/2016 Document Reviewed: 08/14/2013  Elsevier Interactive Patient Education ? 2017 [employer] Inc[employer]

## 2016-12-12 NOTE — Progress Notes (Signed)
Subjective:    Patient ID: Sandra Ferrell, female    DOB: 1960-11-30, 56 y.o.   MRN: [id number]  HPI  Pt presents to the clinic today with multiple complaints.  She c/o bilateral low back pain. This has been a chronic issue for her but worsening over the last 2 months. She describes the pain as sore and achy. The pain is worse with movement. The pain does not radiate into her legs. She denies numbness or tingling. She denies any injury to the area. She has a history of fibromyalgia and chronic back pain. She had a MRI of the lumbar spine 03/2016 which showed:  IMPRESSION: L4-5 multifactorial marked left foraminal narrowing and compression of the exiting left L4 nerve root. Moderate right foraminal narrowing. Multifactorial severe spinal stenosis and lateral recess stenosis greater on the left.  L3-4 multifactorial moderate thecal sac narrowing. Mild foraminal narrowing greater on the left.  L2-3 multifactorial moderate thecal sac narrowing. Mild foraminal narrowing greater on the left.  L5-S1 multifactorial mild right-sided and mild to slightly moderate left-sided foraminal narrowing. Minimal left lateral recess stenosis. No significant thecal sac compromise.  L1-2 bulge and osteophyte greater to the right with slight encroachment upon exiting right L1 nerve root. Slightly prominent epidural fat. No significant thecal sac compromise.  T12-L1 minimal retrolisthesis and rotation T12. Bulge. Narrowing ventral thecal sac with minimal posterior displacement of the cord.  T11-12 bulge.  Mild narrowing ventral thecal sac.  She reports she had back surgery last year, which helped initially. She is prescribed Tylenol #3, Robaxin and Gabapentin which she is taking but reports it has not provided any relief. She has recently had to be taking care of her mother in law who has been in the hospital, so she has been walking more, which she thinks may have contributed. She reports she is no longer  following with her back surgeon.  She also reports groin pain. With the low back pain, she thought she may have a UTI. She denies urgency, frequency, dysuria or blood in her stool. She denies fever, chills or nausea. She has not taken anything OTC for this.  She generally reports overall just not feeling well. She is unable to explain exactly how she feels or what she thinks may be causing this.  She also reports swelling in her legs. This started 5 days ago. She feels pressure from the swelling but denies pain. She denies redness or warmth. She reports the swelling is there when she wakes up, but worse in the evenings. She is on HCTZ. She does not elevate her legs.  Review of Systems      Past Medical History:  Diagnosis Date  . Allergic rhinitis, cause unspecified   . Anxiety state, unspecified   . Backache, unspecified   . Calculus of kidney   . Carpal tunnel syndrome, right   . Depressive disorder, not elsewhere classified    managed with medications  . Esophageal reflux   . Extrinsic asthma with exacerbation   . Fibromyalgia   . Heart murmur   . Hypertension   . Osteoarthrosis, unspecified whether generalized or localized, unspecified site   . Other and unspecified hyperlipidemia   . Other screening mammogram   . Personal history of unspecified urinary disorder   . Pneumonia   . Routine general medical examination at a health care facility   . Routine gynecological examination   . Tobacco use disorder   . Type II or unspecified type diabetes mellitus without  mention of complication, not stated as uncontrolled   . Unspecified sleep apnea   . Wears glasses     Current Outpatient Prescriptions  Medication Sig Dispense Refill  . ACCU-CHEK AVIVA PLUS test strip CHECK BLOOD SUGAR TWICE DAILY AND AS DIRECTED 100 each 11  . ACCU-CHEK SOFTCLIX LANCETS lancets CHECK BLOOD SUGAR TWICE DAILY AND AS DIRECTED 100 each 11  . acetaminophen-codeine (TYLENOL #3) 300-30 MG tablet TAKE 1  TABLET BY MOUTH EVERY 8 HOURS AS NEEDED FOR PAIN 90 tablet 0  . AIMSCO INSULIN SYR ULTRA THIN 31G X 5/16" 0.3 ML MISC     . Alcohol Swabs PADS Check blood sugar twice a day and as directed. Dx E11.9 100 each 5  . ALPRAZolam (XANAX) 0.5 MG tablet Take 1 tablet (0.5 mg total) by mouth 3 (three) times daily as needed for anxiety. 90 tablet 0  . amitriptyline (ELAVIL) 50 MG tablet TAKE 1 TABLET(50 MG) BY MOUTH AT BEDTIME 30 tablet 0  . aspirin EC 81 MG tablet Take 81 mg by mouth daily.    Marland Kitchen azithromycin (ZITHROMAX) 250 MG tablet 2 tab po x 1 day then 1 tab po daily 6 tablet 0  . B-D ULTRAFINE III SHORT PEN 31G X 8 MM MISC USE AS DIRECTED WITH INSULIN PEN 100 each 11  . Blood Glucose Monitoring Suppl (ACCU-CHEK AVIVA PLUS) W/DEVICE KIT     . gabapentin (NEURONTIN) 300 MG capsule TAKE 1 CAPSULE(300 MG) BY MOUTH THREE TIMES DAILY 90 capsule 0  . gemfibrozil (LOPID) 600 MG tablet TAKE 1 TABLET BY MOUTH TWICE DAILY 60 tablet 5  . Homeopathic Products (ARNICARE ARNICA) CREA Apply 1 application topically as needed (Apply to ankles for pain.).     Marland Kitchen hydrochlorothiazide (HYDRODIURIL) 25 MG tablet TAKE 1 TABLET BY MOUTH EVERY DAY 30 tablet 5  . Lancet Devices (ADJUSTABLE LANCING DEVICE) MISC Check blood sugar twice a day and as directed. Dx E11.9 100 each 5  . LANTUS SOLOSTAR 100 UNIT/ML Solostar Pen ADMINISTER 30 UNITS UNDER THE SKIN AT BEDTIME 15 mL 5  . metFORMIN (GLUCOPHAGE) 1000 MG tablet TAKE 1 TABLET(1000 MG) BY MOUTH TWICE DAILY WITH A MEAL 60 tablet 5  . methocarbamol (ROBAXIN) 500 MG tablet TAKE 2 TABLETS(1000 MG) BY MOUTH AT BEDTIME 90 tablet 0  . pioglitazone (ACTOS) 45 MG tablet TAKE 1 TABLET BY MOUTH EVERY DAY 30 tablet 5  . predniSONE (DELTASONE) 20 MG tablet 3 tabs by mouth daily x 3 days, then 2 tabs by mouth daily x 2 days then 1 tab by mouth daily x 2 days 15 tablet 0  . PROAIR HFA 108 (90 Base) MCG/ACT inhaler INHALE 2 PUFFS BY MOUTH EVERY 4 HOURS AS NEEDED FOR WHEEZING OR SHORTNESS OF  BREATH 8.5 g 2  . ranitidine (ZANTAC) 150 MG tablet Take 150 mg by mouth 2 (two) times daily.    . SYMBICORT 80-4.5 MCG/ACT inhaler INHALE 2 PUFFS BY MOUTH TWICE DAILY 10.2 g 11  . VICTOZA 18 MG/3ML SOPN ADMINISTER 1.8 MG UNDER THE SKIN DAILY 9 mL 5   No current facility-administered medications for this visit.     Allergies  Allergen Reactions  . Benazepril Anaphylaxis, Swelling and Other (See Comments)    Angioedema, throat swelling  . Diclofenac Sodium Other (See Comments)    GI bleed  . Fluticasone-Salmeterol Hives, Itching and Swelling    Tongue was swollen  . Nsaids Other (See Comments)    Rectal bleeding  . Penicillins Hives and Rash  Has patient had a PCN reaction causing immediate rash, facial/tongue/throat swelling, SOB or lightheadedness with hypotension: no Has patient had a PCN reaction causing severe rash involving mucus membranes or skin necrosis: no Has patient had a PCN reaction that required hospitalization no Has patient had a PCN reaction occurring within the last 10 years: no If all of the above answers are "NO", then may proceed with Cephalosporin use.   . Aspirin Other (See Comments)    Burns stomach  . Propoxyphene Other (See Comments)    Darvocet - Headache    Family History  Problem Relation Age of Onset  . Stroke Father   . Colon cancer Cousin     Social History   Social History  . Marital status: Married    Spouse name: N/A  . Number of children: N/A  . Years of education: N/A   Occupational History  . Housewife    Social History Main Topics  . Smoking status: Current Every Day Smoker    Packs/day: 1.00    Years: 35.00    Types: Cigarettes  . Smokeless tobacco: Never Used  . Alcohol use No  . Drug use: No  . Sexual activity: No   Other Topics Concern  . Not on file   Social History Narrative   Moderate diet.    No living will, no HCPOA.     Constitutional: Pt reports fatigue. Denies fever, malaise, headache or abrupt  weight changes.  Respiratory: Denies difficulty breathing, shortness of breath, cough or sputum production.   Cardiovascular: Pt reports swelling in her legs. Denies chest pain, chest tightness, palpitations or swelling in the hands.  Gastrointestinal: Denies abdominal pain, bloating, constipation, diarrhea or blood in the stool.  GU: Denies urgency, frequency, pain with urination, burning sensation, blood in urine, odor or discharge. Musculoskeletal: Pt reports groin pain and low back pain. Denies difficulty with gait, or joint swelling.  Skin: Denies redness, rashes, lesions or ulcercations.  Neurological: Denies dizziness, difficulty with memory, difficulty with speech or problems with balance and coordination.    No other specific complaints in a complete review of systems (except as listed in HPI above).  Objective:   Physical Exam   BP 130/74   Pulse 94   Temp 98.6 F (37 C) (Oral)   Wt 235 lb (106.6 kg)   SpO2 98%   BMI 49.12 kg/m  Wt Readings from Last 3 Encounters:  12/12/16 235 lb (106.6 kg)  10/01/16 226 lb (102.5 kg)  09/24/16 227 lb (103 kg)    General: Appears her stated age, obese in NAD. Abdomen: Soft and nontender. Normal bowel sounds. No CVA tenderness noted. Musculoskeletal: Normal flexion and rotation of the spine. Decreased extension. She is tender over the spine and paraspinal muscles. Neurological: Alert and oriented.    BMET    Component Value Date/Time   NA 137 09/24/2016 0904   K 4.1 09/24/2016 0904   CL 101 09/24/2016 0904   CO2 29 09/24/2016 0904   GLUCOSE 122 (H) 09/24/2016 0904   GLUCOSE 241 (H) 10/08/2006 0821   BUN 13 09/24/2016 0904   CREATININE 0.68 09/24/2016 0904   CALCIUM 9.4 09/24/2016 0904   GFRNONAA >60 04/17/2016 0937   GFRAA >60 04/17/2016 0937    Lipid Panel     Component Value Date/Time   CHOL 154 09/24/2016 0904   TRIG 73.0 09/24/2016 0904   TRIG 105 10/08/2006 0821   HDL 56.40 09/24/2016 0904   CHOLHDL 3  09/24/2016 9381  VLDL 14.6 09/24/2016 0904   LDLCALC 83 09/24/2016 0904    CBC    Component Value Date/Time   WBC 10.1 04/17/2016 0937   RBC 4.57 04/17/2016 0937   HGB 13.2 04/17/2016 0937   HCT 41.1 04/17/2016 0937   PLT 379 04/17/2016 0937   MCV 89.9 04/17/2016 0937   MCH 28.9 04/17/2016 0937   MCHC 32.1 04/17/2016 0937   RDW 13.2 04/17/2016 0937   LYMPHSABS 4.2 (H) 04/17/2016 0937   MONOABS 0.8 04/17/2016 0937   EOSABS 0.2 04/17/2016 0937   BASOSABS 0.0 04/17/2016 0937    Hgb A1C Lab Results  Component Value Date   HGBA1C 7.1 (H) 09/24/2016           Assessment & Plan:    Low back pain:  Urinalysis: normal Chronic issue No apparent acute process Advised her to continue Robaxin, Gabapentin and Tylenol #3 Advised her to follow up with PCP or back surgeon  Edema:  Encouraged elevations No concern for clot Stop HCTZ eRx for Lasix 20 mg daily  Advised her I could not address her other concerns in such a short visit, she would need to follow up with PCP  Webb Silversmith, NP

## 2016-12-20 ENCOUNTER — Other Ambulatory Visit: Payer: Self-pay | Admitting: Family Medicine

## 2016-12-20 NOTE — Telephone Encounter (Signed)
Last office visit 12/12/2016 with R. Baity for back pain.  Last refilled 11/20/2016 for #90 with no refills.  Ok to refill?

## 2016-12-21 NOTE — Telephone Encounter (Signed)
Pt left v/m requesting status of methocarbamol refill; unable to reach pt by phone but spoke with pts husband (DPR signed) and notified refill done on 12/20/16 and pt can ck with pharmacy. Sandra Ferrell voiced understanding.

## 2016-12-24 ENCOUNTER — Other Ambulatory Visit: Payer: Self-pay | Admitting: Family Medicine

## 2016-12-24 NOTE — Telephone Encounter (Signed)
Tylenol #3 and Alprazolam called into [facility] - [place],  - [address] Phone: [phone] 

## 2016-12-24 NOTE — Telephone Encounter (Signed)
Last office visit 12/12/2016 with R. Baity for back pain.  Last refilled 11/27/2016.  Ok to refill?

## 2016-12-27 ENCOUNTER — Telehealth: Payer: Self-pay | Admitting: Family Medicine

## 2016-12-27 DIAGNOSIS — E785 Hyperlipidemia, unspecified: Secondary | ICD-10-CM

## 2016-12-27 DIAGNOSIS — E119 Type 2 diabetes mellitus without complications: Secondary | ICD-10-CM

## 2016-12-27 NOTE — Telephone Encounter (Signed)
-----   Message from Ellamae Sia sent at 12/19/2016 12:09 PM EST ----- Regarding: Lab orders for Friday, 2.23.18 Lab orders for a DM f/u

## 2016-12-28 ENCOUNTER — Other Ambulatory Visit (INDEPENDENT_AMBULATORY_CARE_PROVIDER_SITE_OTHER): Payer: Medicare Other

## 2016-12-28 DIAGNOSIS — E119 Type 2 diabetes mellitus without complications: Secondary | ICD-10-CM | POA: Diagnosis not present

## 2016-12-28 LAB — HEMOGLOBIN A1C: HEMOGLOBIN A1C: 7.2 % — AB (ref 4.6–6.5)

## 2017-01-01 ENCOUNTER — Encounter: Payer: Self-pay | Admitting: Family Medicine

## 2017-01-01 ENCOUNTER — Ambulatory Visit (INDEPENDENT_AMBULATORY_CARE_PROVIDER_SITE_OTHER): Payer: Medicare Other | Admitting: Family Medicine

## 2017-01-01 VITALS — BP 144/64 | HR 97 | Temp 98.4°F | Ht <= 58 in | Wt 229.0 lb

## 2017-01-01 DIAGNOSIS — R1031 Right lower quadrant pain: Secondary | ICD-10-CM

## 2017-01-01 DIAGNOSIS — R609 Edema, unspecified: Secondary | ICD-10-CM | POA: Diagnosis not present

## 2017-01-01 DIAGNOSIS — E119 Type 2 diabetes mellitus without complications: Secondary | ICD-10-CM

## 2017-01-01 DIAGNOSIS — Z23 Encounter for immunization: Secondary | ICD-10-CM

## 2017-01-01 DIAGNOSIS — R1032 Left lower quadrant pain: Secondary | ICD-10-CM | POA: Insufficient documentation

## 2017-01-01 LAB — BASIC METABOLIC PANEL
BUN: 12 mg/dL (ref 6–23)
CHLORIDE: 103 meq/L (ref 96–112)
CO2: 27 mEq/L (ref 19–32)
Calcium: 8.7 mg/dL (ref 8.4–10.5)
Creatinine, Ser: 0.69 mg/dL (ref 0.40–1.20)
GFR: 93.33 mL/min (ref >60.00)
GLUCOSE: 112 mg/dL — AB (ref 70–99)
POTASSIUM: 3.7 meq/L (ref 3.5–5.1)
SODIUM: 137 meq/L (ref 135–145)

## 2017-01-01 NOTE — Progress Notes (Signed)
Subjective:    Patient ID: Sandra Ferrell, female    DOB: [birth year], 56 y.o.   MRN: [id number]  HPI  56 year old female presents for DM follow up.   Diabetes:   Slight worsening in A1C on same regimen. On actos, victoza (has decreased victoza  times  ONLY 2 times a week), metformin, lantus 30 units at night. Lab Results  Component Value Date   HGBA1C 7.2 (H) 12/28/2016  Using medications without difficulties: Hypoglycemic episodes:none Hyperglycemic episodes: none Feet problems: none Blood Sugars averaging: CBGs 1115-120. eye exam within last year:   Wt Readings from Last 3 Encounters:  01/01/17 229 lb (103.9 kg)  12/12/16 235 lb (106.6 kg)  10/01/16 226 lb (102.5 kg)    She has been having lower abd pain, not really in groin but lower abd. Off and on in last few months.  Neg UA at 12/12/2016 OV.  Still has uterus and ovaries. No vaginal bleeding, no vag discharge. No freq, urgency or dysuria.  She is post menopausal for year.  NO D/C/N/V.  Colonoscopy 2012: nml colon    Swelling in legs better on lasix. She has lost 6 lbs of fluid.  Low back pain is back at baseline.   Review of Systems  Constitutional: Negative for fatigue and fever.  HENT: Negative for ear pain.   Eyes: Negative for pain.  Respiratory: Negative for shortness of breath.   Cardiovascular: Negative for chest pain.       Objective:   Physical Exam  Constitutional: Vital signs are normal. She appears well-developed and well-nourished. She is cooperative.  Non-toxic appearance. She does not appear ill. No distress.  Morbid obesity  HENT:  Head: Normocephalic.  Right Ear: Hearing, tympanic membrane, external ear and ear canal normal. Tympanic membrane is not erythematous, not retracted and not bulging.  Left Ear: Hearing, tympanic membrane, external ear and ear canal normal. Tympanic membrane is not erythematous, not retracted and not bulging.  Nose: No mucosal edema or rhinorrhea. Right sinus  exhibits no maxillary sinus tenderness and no frontal sinus tenderness. Left sinus exhibits no maxillary sinus tenderness and no frontal sinus tenderness.  Mouth/Throat: Uvula is midline, oropharynx is clear and moist and mucous membranes are normal.  Eyes: Conjunctivae, EOM and lids are normal. Pupils are equal, round, and reactive to light. Lids are everted and swept, no foreign bodies found.  Neck: Trachea normal and normal range of motion. Neck supple. Carotid bruit is not present. No thyroid mass and no thyromegaly present.  Cardiovascular: Normal rate, regular rhythm, S1 normal, S2 normal, normal heart sounds, intact distal pulses and normal pulses.  Exam reveals no gallop and no friction rub.   No murmur heard. Pulmonary/Chest: Effort normal and breath sounds normal. No tachypnea. No respiratory distress. She has no decreased breath sounds. She has no wheezes. She has no rhonchi. She has no rales.  Abdominal: Soft. Normal appearance and bowel sounds are normal. There is no tenderness.  Genitourinary: Uterus normal. There is no rash, tenderness, lesion or injury on the right labia. There is no rash, tenderness, lesion or injury on the left labia. Uterus is not deviated, not enlarged, not fixed and not tender. Right adnexum displays tenderness. Right adnexum displays no mass and no fullness. Left adnexum displays tenderness. Left adnexum displays no mass and no fullness.  Neurological: She is alert.  Skin: Skin is warm, dry and intact. No rash noted.  Psychiatric: Her speech is normal and behavior is normal. Judgment  and thought content normal. Her mood appears not anxious. Cognition and memory are normal. She does not exhibit a depressed mood.          Assessment & Plan:

## 2017-01-01 NOTE — Assessment & Plan Note (Signed)
Worsened control. Increase victoza back to daily if able.

## 2017-01-01 NOTE — Patient Instructions (Addendum)
Try to increase victoza back up to daily for weight loss and sugar control.  Please stop at the lab to set up to have labs drawn.  Please stop at the front desk to set up referral.

## 2017-01-01 NOTE — Assessment & Plan Note (Signed)
Check potassium and Cr on lasix.Marland Kitchen Has lost 6 lbs fluid.

## 2017-01-01 NOTE — Progress Notes (Signed)
Pre visit review using our clinic review tool, if applicable. No additional management support is needed unless otherwise documented below in the visit note. 

## 2017-01-01 NOTE — Assessment & Plan Note (Signed)
Nonspecific symptoms. Will eval with pelvic US to rule out ovarian or uterine pathology. Pt has not vaginal bleeding.

## 2017-01-08 ENCOUNTER — Ambulatory Visit: Admission: RE | Admit: 2017-01-08 | Payer: Medicare Other | Source: Ambulatory Visit

## 2017-01-16 ENCOUNTER — Other Ambulatory Visit: Payer: Self-pay | Admitting: Internal Medicine

## 2017-01-16 ENCOUNTER — Other Ambulatory Visit: Payer: Self-pay | Admitting: Family Medicine

## 2017-01-16 NOTE — Telephone Encounter (Signed)
Last office visit 01/01/2017.  Ok to refill?

## 2017-01-17 NOTE — Telephone Encounter (Signed)
Last filled by Garnette Gunner 12/12/16--please advise if okay for pt to continue

## 2017-01-18 NOTE — Telephone Encounter (Signed)
Alprazolam and Tylenol 3 Left refill on voice mail at pharmacy

## 2017-01-22 ENCOUNTER — Ambulatory Visit: Payer: Medicare Other

## 2017-02-05 ENCOUNTER — Ambulatory Visit
Admission: RE | Admit: 2017-02-05 | Discharge: 2017-02-05 | Disposition: A | Discharge status: Home / Self Care | Payer: Medicare Other | Source: Ambulatory Visit | Attending: Family Medicine | Admitting: Family Medicine

## 2017-02-05 DIAGNOSIS — R1031 Right lower quadrant pain: Secondary | ICD-10-CM | POA: Diagnosis not present

## 2017-02-05 DIAGNOSIS — R1032 Left lower quadrant pain: Secondary | ICD-10-CM | POA: Insufficient documentation

## 2017-02-05 DIAGNOSIS — R1030 Lower abdominal pain, unspecified: Secondary | ICD-10-CM | POA: Diagnosis not present

## 2017-02-07 ENCOUNTER — Other Ambulatory Visit: Payer: Self-pay

## 2017-02-07 MED ORDER — ACCU-CHEK AVIVA PLUS W/DEVICE KIT: PACK | 0 refills | Status: DC

## 2017-02-07 NOTE — Telephone Encounter (Signed)
pts accuck meter has stopped working and pt request meter sent to [facility]; already has strips. Advised pt done.

## 2017-02-14 ENCOUNTER — Other Ambulatory Visit: Payer: Self-pay | Admitting: Family Medicine

## 2017-02-14 NOTE — Telephone Encounter (Signed)
Last office visit 01/01/2017.  Last refilled 01/18/2017.  Ok to refill?

## 2017-02-14 NOTE — Telephone Encounter (Signed)
Pt left v/m requesting cb when refills from pharmacy are filled.

## 2017-02-15 NOTE — Telephone Encounter (Signed)
Alprazolam and Tylenol #3 called into [facility] [place], [address] Phone: [phone]. Michaelene notified refills have been sent/called into her pharmacy.

## 2017-02-20 ENCOUNTER — Other Ambulatory Visit: Payer: Self-pay | Admitting: Family Medicine

## 2017-02-21 LAB — HM DIABETES EYE EXAM

## 2017-03-04 ENCOUNTER — Other Ambulatory Visit: Payer: Self-pay | Admitting: Family Medicine

## 2017-03-05 ENCOUNTER — Ambulatory Visit (INDEPENDENT_AMBULATORY_CARE_PROVIDER_SITE_OTHER)
Admission: RE | Admit: 2017-03-05 | Discharge: 2017-03-05 | Disposition: A | Discharge status: Home / Self Care | Payer: Medicare Other | Source: Ambulatory Visit | Attending: Family Medicine | Admitting: Family Medicine

## 2017-03-05 ENCOUNTER — Ambulatory Visit (INDEPENDENT_AMBULATORY_CARE_PROVIDER_SITE_OTHER): Payer: Medicare Other | Admitting: Family Medicine

## 2017-03-05 ENCOUNTER — Encounter: Payer: Self-pay | Admitting: Family Medicine

## 2017-03-05 ENCOUNTER — Telehealth: Payer: Self-pay | Admitting: Family Medicine

## 2017-03-05 VITALS — BP 128/60 | HR 88 | Temp 98.4°F | Ht <= 58 in | Wt 234.2 lb

## 2017-03-05 DIAGNOSIS — R200 Anesthesia of skin: Secondary | ICD-10-CM

## 2017-03-05 DIAGNOSIS — R202 Paresthesia of skin: Secondary | ICD-10-CM

## 2017-03-05 DIAGNOSIS — M549 Dorsalgia, unspecified: Secondary | ICD-10-CM | POA: Diagnosis not present

## 2017-03-05 DIAGNOSIS — Z981 Arthrodesis status: Secondary | ICD-10-CM | POA: Diagnosis not present

## 2017-03-05 DIAGNOSIS — R296 Repeated falls: Secondary | ICD-10-CM | POA: Diagnosis not present

## 2017-03-05 DIAGNOSIS — G8929 Other chronic pain: Secondary | ICD-10-CM

## 2017-03-05 DIAGNOSIS — M545 Low back pain: Secondary | ICD-10-CM | POA: Diagnosis not present

## 2017-03-05 MED ORDER — GABAPENTIN 300 MG PO CAPS: ORAL_CAPSULE | ORAL | 0 refills | Status: DC

## 2017-03-05 NOTE — Patient Instructions (Addendum)
Start trail of increase to 2 tabs of gabapentin at bedtime. Stay on same dose during the day.  We will call with x-ray results.

## 2017-03-05 NOTE — Telephone Encounter (Signed)
-----   Message from Carter Kitten, [place] sent at 03/05/2017  1:43 PM EDT ----- Chrys Racer notified as instructed by telephone.  She is agreeable to the nerve conduction test.

## 2017-03-05 NOTE — Assessment & Plan Note (Signed)
Unclear if from cervical spine issue or peripheral. Start with eval of cervical spine. If no clear change.. Consider nerve conduction.

## 2017-03-05 NOTE — Progress Notes (Signed)
Subjective:    Patient ID: Sandra Ferrell, female    DOB: [birth year], 56 y.o.   MRN: [id number]  HPI    56 year old female pt with morbid obesity and diabetes presents after fall with back pain and numbness. She feels stiff in low back.. Pain radiates to buttocks and  bilateral legs. Walking helps some. She is wearing back brace to help with pain. No numbness in feet. Legs are week. She had been doing well after surgery.. until  10/2016. Worsened after she started doing more at home and had increase in responsibilities  Given mother in law sick.  She had fallen two times, 2 weeks ago and last week.. Knees gave out on her.  She treats her chronic low back pain with robaxin, gabapentin ( 300 mg three times a day) and tylenol #3.   She is S/P LEFT SIDED LUMBAR 4-5 TRANSFORAMINAL LUMBAR INTERBODY FUSION  Dr Lynann Bologna 04/2016  She had a MRI of the lumbar spine 03/2016 which showed:  IMPRESSION: L4-5 multifactorial marked left foraminal narrowing and compression of the exiting left L4 nerve root. Moderate right foraminal narrowing. Multifactorial severe spinal stenosis and lateral recess stenosis greater on the left. L3-4 multifactorial moderate thecal sac narrowing. Mild foraminal narrowing greater on the left. L2-3 multifactorial moderate thecal sac narrowing. Mild foraminal narrowing greater on the left. L5-S1 multifactorial mild right-sided and mild to slightly moderate left-sided foraminal narrowing. Minimal left lateral recess stenosis. No significant thecal sac compromise. L1-2 bulge and osteophyte greater to the right with slight encroachment upon exiting right L1 nerve root. Slightly prominent epidural fat. No significant thecal sac compromise. T12-L1 minimal retrolisthesis and rotation T12. Bulge. Narrowing ventral thecal sac with minimal posterior displacement of the cord. T11-12 bulge. Mild narrowing ventral thecal sac.   2016 she had ANTERIOR CERVICAL  DECOMPRESSION/DISCECTOMY FUSION 2 LEVELS C5-7  She has no neck pain.Marland Kitchen just had noted numbness in bilateral hands up to elbows in last 3 months worse lately. Numbness ion right in in C7 distribution, entire arm elbow dose on left.  She has not noted  Immediately when she burned fingers with curling iron. No weakness.   Review of Systems  Constitutional: Positive for fatigue. Negative for fever.  HENT: Negative for ear pain.   Eyes: Negative for pain.  Respiratory: Negative for chest tightness and shortness of breath.   Cardiovascular: Negative for chest pain, palpitations and leg swelling.  Gastrointestinal: Negative for abdominal pain.  Genitourinary: Negative for dysuria.  Neurological: Positive for weakness and numbness.       Poor balance and frequent falls       Objective:   Physical Exam  Constitutional: She is oriented to person, place, and time. Vital signs are normal. She appears well-developed and well-nourished. She is cooperative.  Non-toxic appearance. She does not appear ill. No distress.  Morbid obesity   HENT:  Head: Normocephalic.  Right Ear: Hearing, tympanic membrane, external ear and ear canal normal. Tympanic membrane is not erythematous, not retracted and not bulging.  Left Ear: Hearing, tympanic membrane, external ear and ear canal normal. Tympanic membrane is not erythematous, not retracted and not bulging.  Nose: No mucosal edema or rhinorrhea. Right sinus exhibits no maxillary sinus tenderness and no frontal sinus tenderness. Left sinus exhibits no maxillary sinus tenderness and no frontal sinus tenderness.  Mouth/Throat: Uvula is midline, oropharynx is clear and moist and mucous membranes are normal.  Eyes: Conjunctivae, EOM and lids are normal. Pupils are equal, round, and  reactive to light. Lids are everted and swept, no foreign bodies found.  Neck: Trachea normal and normal range of motion. Neck supple. Carotid bruit is not present. No thyroid mass and no  thyromegaly present.  Cardiovascular: Normal rate, regular rhythm, S1 normal, S2 normal, normal heart sounds, intact distal pulses and normal pulses.  Exam reveals no gallop and no friction rub.   No murmur heard. Pulmonary/Chest: Effort normal and breath sounds normal. No tachypnea. No respiratory distress. She has no decreased breath sounds. She has no wheezes. She has no rhonchi. She has no rales.  Abdominal: Soft. Normal appearance and bowel sounds are normal. There is no tenderness.  Neurological: She is alert and oriented to person, place, and time. She has normal strength and normal reflexes. She displays no atrophy and no tremor. A sensory deficit is present. She exhibits normal muscle tone. She displays a negative Romberg sign. Coordination and gait normal.   Decreased sensation in left hand and arm to elbow, right C7 distribution  Skin: Skin is warm, dry and intact. No rash noted.  Psychiatric: Her speech is normal and behavior is normal. Judgment and thought content normal. Her mood appears not anxious. Cognition and memory are normal. She does not exhibit a depressed mood.          Assessment & Plan:

## 2017-03-05 NOTE — Addendum Note (Signed)
Addended by: Carter Kitten on: 03/05/2017 10:24 AM   Modules accepted: Orders

## 2017-03-05 NOTE — Assessment & Plan Note (Signed)
Worsening pain, no new injury but has fallen a lot. Concern for recurrent neurogenic claudication.  Will eval with x-ray lumbar spine and cervical spine.  INCrease neurontin at bedtime.

## 2017-03-05 NOTE — Progress Notes (Signed)
Pre visit review using our clinic review tool, if applicable. No additional management support is needed unless otherwise documented below in the visit note. 

## 2017-03-07 ENCOUNTER — Telehealth: Payer: Self-pay | Admitting: Family Medicine

## 2017-03-07 DIAGNOSIS — R202 Paresthesia of skin: Principal | ICD-10-CM

## 2017-03-07 DIAGNOSIS — Z981 Arthrodesis status: Secondary | ICD-10-CM

## 2017-03-07 DIAGNOSIS — R200 Anesthesia of skin: Secondary | ICD-10-CM

## 2017-03-07 DIAGNOSIS — R29898 Other symptoms and signs involving the musculoskeletal system: Secondary | ICD-10-CM

## 2017-03-07 NOTE — Telephone Encounter (Signed)
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 Pt concerned and request cb.

## 2017-03-07 NOTE — Telephone Encounter (Signed)
Call ed to discuss nerve conduction test.   Gabapentin helping with leg pain, but still weak in legs  Refer to PT.  Get back to walking.  For numbness  In hands bilateral.. Discussed nerve conduction test. She agrees to setting this up.  Has OV appt 03/19/17 with me.

## 2017-03-08 ENCOUNTER — Other Ambulatory Visit: Payer: Self-pay | Admitting: Family Medicine

## 2017-03-08 DIAGNOSIS — Z981 Arthrodesis status: Secondary | ICD-10-CM

## 2017-03-08 DIAGNOSIS — R200 Anesthesia of skin: Secondary | ICD-10-CM

## 2017-03-08 DIAGNOSIS — R202 Paresthesia of skin: Principal | ICD-10-CM

## 2017-03-11 ENCOUNTER — Other Ambulatory Visit: Payer: Self-pay | Admitting: *Deleted

## 2017-03-11 ENCOUNTER — Encounter: Payer: Self-pay | Admitting: Neurology

## 2017-03-11 DIAGNOSIS — R200 Anesthesia of skin: Secondary | ICD-10-CM

## 2017-03-12 ENCOUNTER — Other Ambulatory Visit: Payer: Self-pay | Admitting: *Deleted

## 2017-03-12 ENCOUNTER — Other Ambulatory Visit: Payer: Self-pay | Admitting: Family Medicine

## 2017-03-12 ENCOUNTER — Other Ambulatory Visit: Payer: Self-pay

## 2017-03-12 DIAGNOSIS — R200 Anesthesia of skin: Secondary | ICD-10-CM

## 2017-03-12 MED ORDER — ALPRAZOLAM 0.5 MG PO TABS: 0.5000 mg | ORAL_TABLET | Freq: Three times a day (TID) | ORAL | 0 refills | Status: DC | PRN

## 2017-03-12 MED ORDER — ACETAMINOPHEN-CODEINE #3 300-30 MG PO TABS: 1.0000 | ORAL_TABLET | Freq: Three times a day (TID) | ORAL | 0 refills | Status: DC | PRN

## 2017-03-12 MED ORDER — METHOCARBAMOL 500 MG PO TABS: ORAL_TABLET | ORAL | 0 refills | Status: DC

## 2017-03-12 NOTE — Telephone Encounter (Signed)
Pt requesting to pick up tylenol # 3,xanax and methocarbamol rx;each med was printed or refilled # 90 on 02/15/17. Pt last seen 03/05/17. pt is going out of town early on 03/15/17; pt is taking her mother in law to [facility] who is presently under hospice care and [place] is not sure how long they will be at [facility]. Pt request cb if can pick up rx for the 3 meds on 03/14/17.

## 2017-03-13 NOTE — Telephone Encounter (Signed)
Sandra Ferrell notified that her prescriptions are ready to be picked up at the front desk.

## 2017-03-13 NOTE — Telephone Encounter (Signed)
Pt called back and wanted to know if I could [facility] the [facility] [place] and pick up before leaves town; [place] at [facility] said could fill the Tylenol # 3 and alprazolam on 03/14/17 but the methocarbamol was a 35 day rx and would be too early to fill. [place] said [place] she would pick up rx for all three meds at front desk. rx at front desk for pick up.

## 2017-03-19 ENCOUNTER — Ambulatory Visit: Payer: Medicare Other | Admitting: Family Medicine

## 2017-03-29 ENCOUNTER — Encounter: Payer: Self-pay | Admitting: Family Medicine

## 2017-03-31 ENCOUNTER — Other Ambulatory Visit: Payer: Self-pay | Admitting: Family Medicine

## 2017-03-31 NOTE — Telephone Encounter (Signed)
Last office visit 03/05/17.  Last refilled 03/05/17 for #120 with no refills. Ok to refill?

## 2017-04-02 DIAGNOSIS — M5416 Radiculopathy, lumbar region: Secondary | ICD-10-CM | POA: Diagnosis not present

## 2017-04-02 DIAGNOSIS — G959 Disease of spinal cord, unspecified: Secondary | ICD-10-CM | POA: Diagnosis not present

## 2017-04-03 ENCOUNTER — Other Ambulatory Visit: Payer: Self-pay | Admitting: Orthopedic Surgery

## 2017-04-03 DIAGNOSIS — G959 Disease of spinal cord, unspecified: Secondary | ICD-10-CM

## 2017-04-03 DIAGNOSIS — M5416 Radiculopathy, lumbar region: Secondary | ICD-10-CM

## 2017-04-08 ENCOUNTER — Other Ambulatory Visit: Payer: Self-pay | Admitting: Family Medicine

## 2017-04-08 NOTE — Telephone Encounter (Addendum)
Last office visit 03/05/2017.  Last refilled 03/12/2017. Ok to refill?

## 2017-04-09 ENCOUNTER — Ambulatory Visit (INDEPENDENT_AMBULATORY_CARE_PROVIDER_SITE_OTHER): Payer: Medicare Other | Admitting: Neurology

## 2017-04-09 DIAGNOSIS — M5412 Radiculopathy, cervical region: Secondary | ICD-10-CM

## 2017-04-09 DIAGNOSIS — R200 Anesthesia of skin: Secondary | ICD-10-CM

## 2017-04-09 DIAGNOSIS — G5603 Carpal tunnel syndrome, bilateral upper limbs: Secondary | ICD-10-CM

## 2017-04-09 DIAGNOSIS — G5621 Lesion of ulnar nerve, right upper limb: Secondary | ICD-10-CM

## 2017-04-09 MED ORDER — ACETAMINOPHEN-CODEINE #3 300-30 MG PO TABS: 1.0000 | ORAL_TABLET | Freq: Three times a day (TID) | ORAL | 0 refills | Status: DC | PRN

## 2017-04-09 MED ORDER — ALPRAZOLAM 0.5 MG PO TABS: 0.5000 mg | ORAL_TABLET | Freq: Three times a day (TID) | ORAL | 0 refills | Status: DC | PRN

## 2017-04-09 NOTE — Procedures (Signed)
[facility]  [address], [place]  [place], [place] [address] Tel: [phone] Fax:  [phone] Test Date:  04/09/2017  Patient: Sandra Ferrell DOB: 12/08/1960 Physician: Narda Amber, DO  Sex: Female Height: 4\' 10"  Ref Phys: Eliezer Lofts, MD  ID#: [id number] Temp: 35.0C Technician:    Patient Complaints: This is a 56 year-old female with history of cervical surgery referred for evaluation of generalized pain and paresthesias of the hands.    NCV & EMG Findings: Extensive electrodiagnostic testing of the right upper extremity and additional studies of the left shows:  1. Bilateral median sensory responses show prolonged latency (R5.0, L5.6 ms) and reduced amplitude (R5.8, L4.5 V).  Right ulnar sensory response shows asymmetrically reduced amplitude as compared to the left.  2. Bilateral median motor responses show prolonged latency (4.5ms) and on the left there is evidence of reduced amplitude (4.1 mV).  Right ulnar motor response shows conduction velocity slowing across the elbow, with normal latency and amplitude. 3. Chronic motor axon loss changes are seen affecting the right ulnar innervated muscles, bilateral biceps, deltoid, and abductor pollicis brevis muscles. There is no evidence of accompanied active denervation.  Impression: 1. Bilateral median neuropathy at or distal to the wrist, consistent with the clinical diagnosis of carpal tunnel syndrome. Overall, these findings are severe in degree electrically and worse on the left. 2. Right ulnar neuropathy with slowing across the elbow, purely demyelinating in type. 3. Chronic C5 radiculopathy affecting bilateral upper extremities, mild-to-moderate in degree electrically    ___________________________ Narda Amber, DO    Nerve Conduction Studies Anti Sensory Summary Table   Site NR Peak (ms) Norm Peak (ms) P-T Amp (V) Norm P-T Amp  Left Median Anti Sensory (2nd Digit)  35C  Wrist    5.6 <3.6 4.5 >15    Right Median Anti Sensory (2nd Digit)  Wrist    5.0 <3.6 5.8 >15  Left Ulnar Anti Sensory (5th Digit)  35C  Wrist    2.4 <3.1 35.0 >10  Right Ulnar Anti Sensory (5th Digit)  Wrist    2.9 <3.1 13.1 >10   Motor Summary Table   Site NR Onset (ms) Norm Onset (ms) O-P Amp (mV) Norm O-P Amp Site1 Site2 Delta-0 (ms) Dist (cm) Vel (m/s) Norm Vel (m/s)  Left Median Motor (Abd Poll Brev)  35C  Wrist    4.9 <4.0 4.1 >6 Elbow Wrist 4.6 26.0 57 >50  Elbow    9.5  3.7         Right Median Motor (Abd Poll Brev)  35C  Wrist    4.9 <4.0 8.8 >6 Elbow Wrist 6.0 25.0 49 >50  Elbow    10.9  8.3         Left Ulnar Motor (Abd Dig Minimi)  35C  Wrist    2.0 <3.1 7.6 >7 B Elbow Wrist 3.8 25.0 66 >50  B Elbow    5.8  7.0  A Elbow B Elbow 1.7 10.0 59 >50  A Elbow    7.5  6.2         Right Ulnar Motor (Abd Dig Minimi)  35C  Wrist    2.5 <3.1 10.6 >7 B Elbow Wrist 3.8 21.0 55 >50  B Elbow    6.3  10.2  A Elbow B Elbow 2.5 10.0 40 >50  A Elbow    8.8  8.7          EMG   Side Muscle Ins Act Fibs Psw  Fasc Number Recrt Dur Dur. Amp Amp. Poly Poly. Comment  Right 1stDorInt Nml Nml Nml Nml 1- Rapid Some 1+ Some 1+ Nml Nml N/A  Right Abd Poll Brev Nml Nml Nml Nml Nml Nml Nml Nml Nml Nml Nml Nml N/A  Right Ext Indicis Nml Nml Nml Nml Nml Nml Nml Nml Nml Nml Nml Nml N/A  Right PronatorTeres Nml Nml Nml Nml Nml Nml Nml Nml Nml Nml Nml Nml N/A  Right Biceps Nml Nml Nml Nml 1- Rapid Some 1+ Some 1+ Nml Nml N/A  Right Triceps Nml Nml Nml Nml Nml Nml Nml Nml Nml Nml Nml Nml N/A  Right Deltoid Nml Nml Nml Nml 1- Rapid Some 1+ Some 1+ Nml Nml N/A  Right ABD Dig Min Nml Nml Nml Nml 1- Rapid Some 1+ Some 1+ Nml Nml N/A  Right FlexDigProf 4,5 Nml Nml Nml Nml 1- Rapid Some 1+ Some 1+ Nml Nml N/A  Left 1stDorInt Nml Nml Nml Nml Nml Nml Nml Nml Nml Nml Nml Nml N/A  Left Abd Poll Brev Nml Nml Nml Nml 1- Rapid Some 1+ Some 1+ Nml Nml N/A  Left Ext Indicis Nml Nml Nml Nml Nml Nml Nml Nml Nml Nml Nml Nml N/A  Left  PronatorTeres Nml Nml Nml Nml Nml Nml Nml Nml Nml Nml Nml Nml N/A  Left Biceps Nml Nml Nml Nml 1- Rapid Some 1+ Some 1+ Nml Nml N/A  Left Triceps Nml Nml Nml Nml Nml Nml Nml Nml Nml Nml Nml Nml N/A  Left Deltoid Nml Nml Nml Nml 1- Rapid Some 1+ Some 1+ Nml Nml N/A      Waveforms:

## 2017-04-09 NOTE — Telephone Encounter (Signed)
Left refills on voice mail at pharmacy  

## 2017-04-10 ENCOUNTER — Telehealth: Payer: Self-pay | Admitting: Family Medicine

## 2017-04-10 NOTE — Telephone Encounter (Signed)
Patient would prefer to get fitting for carpal tunnel braces here at the office.  They are concerned they will not get the right size if purchased at the pharmacy.  Advised patient we will fit her for braces on Tuesday when she comes in to see Dr. [name].

## 2017-04-10 NOTE — Telephone Encounter (Signed)
Husband called regarding getting fitted for braces verus getting brace at drug store.  Please call back thanks

## 2017-04-12 ENCOUNTER — Ambulatory Visit: Payer: Medicare Other | Admitting: Family Medicine

## 2017-04-15 ENCOUNTER — Other Ambulatory Visit: Payer: Self-pay | Admitting: Family Medicine

## 2017-04-16 ENCOUNTER — Encounter: Payer: Self-pay | Admitting: Family Medicine

## 2017-04-16 ENCOUNTER — Ambulatory Visit (INDEPENDENT_AMBULATORY_CARE_PROVIDER_SITE_OTHER): Payer: Medicare Other | Admitting: Family Medicine

## 2017-04-16 VITALS — BP 120/60 | HR 90 | Temp 98.5°F | Ht <= 58 in | Wt 224.8 lb

## 2017-04-16 DIAGNOSIS — Z981 Arthrodesis status: Secondary | ICD-10-CM

## 2017-04-16 DIAGNOSIS — R200 Anesthesia of skin: Secondary | ICD-10-CM | POA: Diagnosis not present

## 2017-04-16 DIAGNOSIS — G5603 Carpal tunnel syndrome, bilateral upper limbs: Secondary | ICD-10-CM | POA: Diagnosis not present

## 2017-04-16 DIAGNOSIS — R202 Paresthesia of skin: Secondary | ICD-10-CM

## 2017-04-16 DIAGNOSIS — S93602A Unspecified sprain of left foot, initial encounter: Secondary | ICD-10-CM | POA: Insufficient documentation

## 2017-04-16 DIAGNOSIS — R29898 Other symptoms and signs involving the musculoskeletal system: Secondary | ICD-10-CM

## 2017-04-16 DIAGNOSIS — R296 Repeated falls: Secondary | ICD-10-CM | POA: Diagnosis not present

## 2017-04-16 DIAGNOSIS — G8929 Other chronic pain: Secondary | ICD-10-CM

## 2017-04-16 DIAGNOSIS — M549 Dorsalgia, unspecified: Secondary | ICD-10-CM | POA: Diagnosis not present

## 2017-04-16 NOTE — Assessment & Plan Note (Signed)
Ice, elevation. If not improving consider X-ray eval. No clear red flags for fracture.

## 2017-04-16 NOTE — Assessment & Plan Note (Signed)
Fall prevention given and reviewed. Recommended 4 prong cane for stability.

## 2017-04-16 NOTE — Progress Notes (Signed)
Subjective:    Patient ID: Sandra Ferrell, female    DOB: October 08, 1961, 56 y.o.   MRN: [id number]  HPI    56 year old female pt with history of  chronic back  and neck pain presents for follow up numbness in bilateral hands, low back pain  as well as frequent falls.  She is S/P LEFT SIDED LUMBAR 4-5 TRANSFORAMINAL LUMBAR INTERBODY FUSION  Dr Lynann Bologna 04/2016 2016 she had ANTERIOR CERVICAL DECOMPRESSION/DISCECTOMY FUSION 2 LEVELS C5-7  Cervical X-ray 03/06/2017: IMPRESSION: No acute cervical spine abnormality. Mild bilateral lower cervical bony encroachment upon the neural foramina is observed. Previous C5-C7 ACDF.  Nerve compression 04/09/2017 showed:  Impression: 1. Bilateral median neuropathy at or distal to the wrist, consistent with the clinical diagnosis of carpal tunnel syndrome. Overall, these findings are severe in degree electrically and worse on the left. 2. Right ulnar neuropathy with slowing across the elbow, purely demyelinating in type. Chronic C5 radiculopathy affecting bilateral upper extremities, mild-to-moderate in degree electrically   Today she reports  1. Continue numbness in bilateral hands, not improving.  2. Low back pain : at last OV increased Neurontin to  2 tabs at bedtime.  HAs had 10% improvement in pain with this. It does make her feel sleepy. Takes at bedtime.  She saw Dr. Laurena Bering assistant, last month. Recommended MRI on neck and back. Has scheduled tommorow.  3.. frequent falls  4. Last fall twisted foot under her in last week.. Pain and bruising in left dorso-lateral foot. Pain improving, able to weight bear.   Review of Systems  Constitutional: Negative for fatigue and fever.  HENT: Negative for ear pain.   Eyes: Negative for pain.  Respiratory: Negative for chest tightness and shortness of breath.   Cardiovascular: Negative for chest pain, palpitations and leg swelling.  Gastrointestinal: Negative for abdominal pain.  Genitourinary:  Negative for dysuria.       Objective:   Physical Exam  Constitutional: She is oriented to person, place, and time. Vital signs are normal. She appears well-developed and well-nourished. She is cooperative.  Non-toxic appearance. She does not appear ill. No distress.  Morbid obesity   HENT:  Head: Normocephalic.  Right Ear: Hearing, tympanic membrane, external ear and ear canal normal. Tympanic membrane is not erythematous, not retracted and not bulging.  Left Ear: Hearing, tympanic membrane, external ear and ear canal normal. Tympanic membrane is not erythematous, not retracted and not bulging.  Nose: No mucosal edema or rhinorrhea. Right sinus exhibits no maxillary sinus tenderness and no frontal sinus tenderness. Left sinus exhibits no maxillary sinus tenderness and no frontal sinus tenderness.  Mouth/Throat: Uvula is midline, oropharynx is clear and moist and mucous membranes are normal.  Eyes: Conjunctivae, EOM and lids are normal. Pupils are equal, round, and reactive to light. Lids are everted and swept, no foreign bodies found.  Neck: Trachea normal and normal range of motion. Neck supple. Carotid bruit is not present. No thyroid mass and no thyromegaly present.  Cardiovascular: Normal rate, regular rhythm, S1 normal, S2 normal, normal heart sounds, intact distal pulses and normal pulses.  Exam reveals no gallop and no friction rub.   No murmur heard. Pulmonary/Chest: Effort normal and breath sounds normal. No tachypnea. No respiratory distress. She has no decreased breath sounds. She has no wheezes. She has no rhonchi. She has no rales.  Abdominal: Soft. Normal appearance and bowel sounds are normal. There is no tenderness.  Musculoskeletal:       Cervical  back: She exhibits decreased range of motion. She exhibits no tenderness and no bony tenderness.       Lumbar back: She exhibits decreased range of motion and tenderness.  Bruising over toes on left foot, ttp over dorsolateral  foot, no swelling, able to weight bear  Neurological: She is alert and oriented to person, place, and time. She has normal strength and normal reflexes. She displays no atrophy and no tremor. A sensory deficit is present. She exhibits normal muscle tone. She displays a negative Romberg sign. Coordination and gait normal.   Decreased sensation in left hand and arm to elbow, right C7 distribution  Skin: Skin is warm, dry and intact. No rash noted.  Psychiatric: Her speech is normal and behavior is normal. Judgment and thought content normal. Her mood appears not anxious. Cognition and memory are normal. She does not exhibit a depressed mood.          Assessment & Plan:

## 2017-04-16 NOTE — Assessment & Plan Note (Signed)
Numbness in hands looks more consistent with carpal tunnel per nerve conduction.  She has pending MRI neck.   Has upcoming nerve conduction of legs as well as MRI lumbar spine.

## 2017-04-16 NOTE — Assessment & Plan Note (Signed)
Severe.. At least in part causing  Numbness in hands.. Treat with nighttime braces. If not improving consider referral to hand specialist.

## 2017-04-16 NOTE — Assessment & Plan Note (Signed)
Pain improved some with increase in gabapentin.  Has further eval upcoming per Dr. Lynann Bologna.

## 2017-04-16 NOTE — Patient Instructions (Signed)
We will await MRI and Dr. Laurena Bering recommendation.  Wear carpal tunnel braces at night .  Consider 4 prong cane for more stability.   Review fall prevention.   Fall Prevention in the Home Falls can cause injuries and can affect people from all age groups. There are many simple things that you can do to make your home safe and to help prevent falls. What can I do on the outside of my home?  Regularly repair the edges of walkways and driveways and fix any cracks.  Remove high doorway thresholds.  Trim any shrubbery on the main path into your home.  Use bright outdoor lighting.  Clear walkways of debris and clutter, including tools and rocks.  Regularly check that handrails are securely fastened and in good repair. Both sides of any steps should have handrails.  Install guardrails along the edges of any raised decks or porches.  Have leaves, snow, and ice cleared regularly.  Use sand or salt on walkways during winter months.  In the garage, clean up any spills right away, including grease or oil spills. What can I do in the bathroom?  Use night lights.  Install grab bars by the toilet and in the tub and shower. Do not use towel bars as grab bars.  Use non-skid mats or decals on the floor of the tub or shower.  If you need to sit down while you are in the shower, use a plastic, non-slip stool.  Keep the floor dry. Immediately clean up any water that spills on the floor.  Remove soap buildup in the tub or shower on a regular basis.  Attach bath mats securely with double-sided non-slip rug tape.  Remove throw rugs and other tripping hazards from the floor. What can I do in the bedroom?  Use night lights.  Make sure that a bedside light is easy to reach.  Do not use oversized bedding that drapes onto the floor.  Have a firm chair that has side arms to use for getting dressed.  Remove throw rugs and other tripping hazards from the floor. What can I do in the  kitchen?  Clean up any spills right away.  Avoid walking on wet floors.  Place frequently used items in easy-to-reach places.  If you need to reach for something above you, use a sturdy step stool that has a grab bar.  Keep electrical cables out of the way.  Do not use floor polish or wax that makes floors slippery. If you have to use wax, make sure that it is non-skid floor wax.  Remove throw rugs and other tripping hazards from the floor. What can I do in the stairways?  Do not leave any items on the stairs.  Make sure that there are handrails on both sides of the stairs. Fix handrails that are broken or loose. Make sure that handrails are as long as the stairways.  Check any carpeting to make sure that it is firmly attached to the stairs. Fix any carpet that is loose or worn.  Avoid having throw rugs at the top or bottom of stairways, or secure the rugs with carpet tape to prevent them from moving.  Make sure that you have a light switch at the top of the stairs and the bottom of the stairs. If you do not have them, have them installed. What are some other fall prevention tips?  Wear closed-toe shoes that fit well and support your feet. Wear shoes that have rubber soles or  low heels.  When you use a stepladder, make sure that it is completely opened and that the sides are firmly locked. Have someone hold the ladder while you are using it. Do not climb a closed stepladder.  Add color or contrast paint or tape to grab bars and handrails in your home. Place contrasting color strips on the first and last steps.  Use mobility aids as needed, such as canes, walkers, scooters, and crutches.  Turn on lights if it is dark. Replace any light bulbs that burn out.  Set up furniture so that there are clear paths. Keep the furniture in the same spot.  Fix any uneven floor surfaces.  Choose a carpet design that does not hide the edge of steps of a stairway.  Be aware of any and all  pets.  Review your medicines with your healthcare provider. Some medicines can cause dizziness or changes in blood pressure, which increase your risk of falling. Talk with your health care provider about other ways that you can decrease your risk of falls. This may include working with a physical therapist or trainer to improve your strength, balance, and endurance. This information is not intended to replace advice given to you by your health care provider. Make sure you discuss any questions you have with your health care provider. Document Released: 10/12/2002 Document Revised: 03/20/2016 Document Reviewed: 11/26/2014 Elsevier Interactive Patient Education  2017 [employer].

## 2017-04-17 ENCOUNTER — Ambulatory Visit
Admission: RE | Admit: 2017-04-17 | Discharge: 2017-04-17 | Disposition: A | Discharge status: Home / Self Care | Payer: Medicare Other | Source: Ambulatory Visit | Attending: Orthopedic Surgery | Admitting: Orthopedic Surgery

## 2017-04-17 DIAGNOSIS — M4802 Spinal stenosis, cervical region: Secondary | ICD-10-CM | POA: Diagnosis not present

## 2017-04-17 DIAGNOSIS — M5416 Radiculopathy, lumbar region: Secondary | ICD-10-CM

## 2017-04-17 DIAGNOSIS — G959 Disease of spinal cord, unspecified: Secondary | ICD-10-CM

## 2017-04-17 DIAGNOSIS — M50222 Other cervical disc displacement at C5-C6 level: Secondary | ICD-10-CM | POA: Diagnosis not present

## 2017-04-19 DIAGNOSIS — G959 Disease of spinal cord, unspecified: Secondary | ICD-10-CM | POA: Diagnosis not present

## 2017-04-22 ENCOUNTER — Other Ambulatory Visit: Payer: Self-pay | Admitting: Orthopedic Surgery

## 2017-04-22 ENCOUNTER — Telehealth: Payer: Self-pay

## 2017-04-22 NOTE — Telephone Encounter (Signed)
Reatha advised she did not need to come in for a pre-op clearance per Dr. Lorelei Pont.  Advised to have surgery on 04/25/2017 as scheduled.

## 2017-04-22 NOTE — Telephone Encounter (Signed)
Agreed -

## 2017-04-22 NOTE — Telephone Encounter (Signed)
She saw Dr. B last week. I don't completely understand her question - MRI reviewed. She should not wait on this - I expect that Dr. Lynann Bologna would have said this.   MRI C Spine 04/17/2017.  "Interval development of broad-based disc protrusion at C5-6 with associated spurring. Moderate to severe spinal stenosis with cord compression and cord hyperintensity"

## 2017-04-22 NOTE — Telephone Encounter (Signed)
Pt left v/m; pt is scheduled for surgery on 04/25/17 by [facility]. Pt said usually before pt has any type surgery she sees Dr [name] prior to surgery. Pt wants to know what Dr [name] thinks about pt having surgery(anterior cervical compression fusion cervical 4-5 with instrumentation and allograft) this Thursday and should pt be seen prior to surgery. Pt request cb ASAP; pt would see Dr Lorelei Pont since Dr [name] out of office if needed.

## 2017-04-23 ENCOUNTER — Encounter: Payer: Medicare Other | Admitting: Neurology

## 2017-04-25 ENCOUNTER — Encounter (HOSPITAL_COMMUNITY): Admission: RE | Payer: Self-pay | Source: Ambulatory Visit

## 2017-04-25 ENCOUNTER — Ambulatory Visit (HOSPITAL_COMMUNITY): Admission: RE | Admit: 2017-04-25 | Payer: Medicare Other | Source: Ambulatory Visit | Admitting: Orthopedic Surgery

## 2017-04-25 SURGERY — ANTERIOR CERVICAL DECOMPRESSION/DISCECTOMY FUSION 1 LEVEL
Anesthesia: General | Laterality: Bilateral

## 2017-04-27 ENCOUNTER — Other Ambulatory Visit: Payer: Self-pay | Admitting: Family Medicine

## 2017-04-29 NOTE — Telephone Encounter (Signed)
Pt left v/m requesting cb when gabapentin has been refilled.

## 2017-04-29 NOTE — Telephone Encounter (Signed)
Received refill electronically Last refill 04/01/17 #120 Last office visit 04/16/17

## 2017-04-30 NOTE — Telephone Encounter (Signed)
Pt has 2 gabapentin left and request cb when gabapentin is refilled.

## 2017-04-30 NOTE — Telephone Encounter (Signed)
Pt left v/m requesting cb when gabapentin is called in.

## 2017-05-02 ENCOUNTER — Telehealth: Payer: Self-pay | Admitting: Family Medicine

## 2017-05-02 ENCOUNTER — Other Ambulatory Visit: Payer: Self-pay | Admitting: Orthopedic Surgery

## 2017-05-03 NOTE — Telephone Encounter (Signed)
All meds last filled 04-09-17 Tylenol 3: #90 Alprazolam #90 Methocarbamol #90  Last tox screen 09-24-16 Last OV 04-16-17 Next OV 07-23-17

## 2017-05-06 ENCOUNTER — Other Ambulatory Visit: Payer: Self-pay | Admitting: Family Medicine

## 2017-05-06 NOTE — Telephone Encounter (Signed)
Pt request refill alprazolam and tylenol # 3 to [facility]. Pt has not checked with the pharmacy yet. Advised pt meds called in today around 9:20. Pt will ck with [facility].

## 2017-05-06 NOTE — Telephone Encounter (Signed)
Tylenol #3 and Alprazolam called into [facility] - [place], [address] Phone: [phone]

## 2017-05-07 ENCOUNTER — Other Ambulatory Visit: Payer: Self-pay | Admitting: Family Medicine

## 2017-05-07 NOTE — Telephone Encounter (Signed)
Pt left v/m; pt said [facility] needs confirmation it is OK for pt to pick up alprazolam today. Pt said last month xanax picked up on 05/09/17. Pt request cb when done.Please advise.

## 2017-05-07 NOTE — Telephone Encounter (Signed)
Pharmacist, at [facility], notified by telephone that it is okay to fill Alprazolam today per Dr. [name].

## 2017-05-16 ENCOUNTER — Other Ambulatory Visit: Payer: Self-pay | Admitting: Family Medicine

## 2017-05-20 ENCOUNTER — Ambulatory Visit (HOSPITAL_COMMUNITY)
Admission: RE | Admit: 2017-05-20 | Discharge: 2017-05-20 | Disposition: A | Discharge status: Home / Self Care | Payer: Medicare Other | Source: Ambulatory Visit | Attending: Orthopedic Surgery | Admitting: Orthopedic Surgery

## 2017-05-20 ENCOUNTER — Encounter (HOSPITAL_COMMUNITY): Payer: Self-pay

## 2017-05-20 ENCOUNTER — Encounter (HOSPITAL_COMMUNITY)
Admission: RE | Admit: 2017-05-20 | Discharge: 2017-05-20 | Disposition: A | Discharge status: Home / Self Care | Payer: Medicare Other | Source: Ambulatory Visit | Attending: Orthopedic Surgery | Admitting: Orthopedic Surgery

## 2017-05-20 DIAGNOSIS — Z01812 Encounter for preprocedural laboratory examination: Secondary | ICD-10-CM | POA: Diagnosis not present

## 2017-05-20 DIAGNOSIS — Z01818 Encounter for other preprocedural examination: Secondary | ICD-10-CM | POA: Insufficient documentation

## 2017-05-20 DIAGNOSIS — Z01810 Encounter for preprocedural cardiovascular examination: Secondary | ICD-10-CM | POA: Diagnosis not present

## 2017-05-20 DIAGNOSIS — M5000 Cervical disc disorder with myelopathy, unspecified cervical region: Secondary | ICD-10-CM | POA: Diagnosis not present

## 2017-05-20 HISTORY — DX: Cough: R05

## 2017-05-20 HISTORY — DX: Cough, unspecified: R05.9

## 2017-05-20 LAB — CBC WITH DIFFERENTIAL/PLATELET
BASOS ABS: 0 10*3/uL (ref 0.0–0.1)
BASOS PCT: 0 %
Eosinophils Absolute: 0.2 10*3/uL (ref 0.0–0.7)
Eosinophils Relative: 2 %
HEMATOCRIT: 37 % (ref 36.0–46.0)
HEMOGLOBIN: 11.6 g/dL — AB (ref 12.0–15.0)
Lymphocytes Relative: 36 %
Lymphs Abs: 3.2 10*3/uL (ref 0.7–4.0)
MCH: 27.4 pg (ref 26.0–34.0)
MCHC: 31.4 g/dL (ref 30.0–36.0)
MCV: 87.5 fL (ref 78.0–100.0)
MONO ABS: 0.6 10*3/uL (ref 0.1–1.0)
Monocytes Relative: 7 %
NEUTROS ABS: 4.9 10*3/uL (ref 1.7–7.7)
NEUTROS PCT: 55 %
Platelets: 378 10*3/uL (ref 150–400)
RBC: 4.23 MIL/uL (ref 3.87–5.11)
RDW: 15.4 % (ref 11.5–15.5)
WBC: 8.9 10*3/uL (ref 4.0–10.5)

## 2017-05-20 LAB — COMPREHENSIVE METABOLIC PANEL
ALBUMIN: 3.4 g/dL — AB (ref 3.5–5.0)
ALT: 17 U/L (ref 14–54)
AST: 24 U/L (ref 15–41)
Alkaline Phosphatase: 93 U/L (ref 38–126)
Anion gap: 9 (ref 5–15)
BILIRUBIN TOTAL: 0.6 mg/dL (ref 0.3–1.2)
BUN: 9 mg/dL (ref 6–20)
CO2: 25 mmol/L (ref 22–32)
Calcium: 9.1 mg/dL (ref 8.9–10.3)
Chloride: 102 mmol/L (ref 101–111)
Creatinine, Ser: 0.67 mg/dL (ref 0.44–1.00)
GFR calc Af Amer: >60 mL/min (ref >60)
GFR calc non Af Amer: >60 mL/min (ref >60)
GLUCOSE: 160 mg/dL — AB (ref 65–99)
POTASSIUM: 4.1 mmol/L (ref 3.5–5.1)
Sodium: 136 mmol/L (ref 135–145)
TOTAL PROTEIN: 7 g/dL (ref 6.5–8.1)

## 2017-05-20 LAB — TYPE AND SCREEN
ABO/RH(D): A POS
Antibody Screen: NEGATIVE

## 2017-05-20 LAB — URINALYSIS, ROUTINE W REFLEX MICROSCOPIC
Bacteria, UA: NONE SEEN
Bilirubin Urine: NEGATIVE
GLUCOSE, UA: NEGATIVE mg/dL
HGB URINE DIPSTICK: NEGATIVE
Ketones, ur: NEGATIVE mg/dL
NITRITE: NEGATIVE
PH: 5 (ref 5.0–8.0)
Protein, ur: NEGATIVE mg/dL
SPECIFIC GRAVITY, URINE: 1.012 (ref 1.005–1.030)

## 2017-05-20 LAB — SURGICAL PCR SCREEN
MRSA, PCR: NEGATIVE
STAPHYLOCOCCUS AUREUS: POSITIVE — AB

## 2017-05-20 LAB — PROTIME-INR
INR: 0.96
Prothrombin Time: 12.8 seconds (ref 11.4–15.2)

## 2017-05-20 LAB — APTT: APTT: 55 s — AB (ref 24–36)

## 2017-05-20 LAB — GLUCOSE, CAPILLARY: Glucose-Capillary: 154 mg/dL — ABNORMAL HIGH (ref 65–99)

## 2017-05-20 NOTE — Pre-Procedure Instructions (Signed)
TERESHA HANKS  05/20/2017      [facility] - [address], [place] [address] Phone: [phone] Fax: [phone]    Your procedure is scheduled on 05/23/17.  Report to [facility] Admitting at 815 A.M.  Call this number if you have problems the morning of surgery:  [phone]   Remember:  Do not eat food or drink liquids after midnight.  Take these medicines the morning of surgery with A SIP OF WATER --tylenol,xanax,neurontin,all inhalers   Do not wear jewelry, make-up or nail polish.  Do not wear lotions, powders, or perfumes, or deoderant.  Do not shave 48 hours prior to surgery.  Men may shave face and neck.  Do not bring valuables to the hospital.  [facility] is not responsible for any belongings or valuables.  Contacts, dentures or bridgework may not be worn into surgery.  Leave your suitcase in the car.  After surgery it may be brought to your room.  For patients admitted to the hospital, discharge time will be determined by your treatment team.  Patients discharged the day of surgery will not be allowed to drive home.   Name and phone number of your driver:    Special instructions:  Do not take any aspirin,anti-inflammatories,vitamins,or herbal supplements 5-7 days prior to surgery.  Please read over the following fact sheets that you were given. MRSA Information    How to Manage Your Diabetes Before and After Surgery  Why is it important to control my blood sugar before and after surgery? . Improving blood sugar levels before and after surgery helps healing and can limit problems. . A way of improving blood sugar control is eating a healthy diet by: o  Eating less sugar and carbohydrates o  Increasing activity/exercise o  Talking with your doctor about reaching your blood sugar goals . High blood sugars (greater than 180 mg/dL) can raise  your risk of infections and slow your recovery, so you will need to focus on controlling your diabetes during the weeks before surgery. . Make sure that the doctor who takes care of your diabetes knows about your planned surgery including the date and location.  How do I manage my blood sugar before surgery? . Check your blood sugar at least 4 times a day, starting 2 days before surgery, to make sure that the level is not too high or low. o Check your blood sugar the morning of your surgery when you wake up and every 2 hours until you get to the Short Stay unit. . If your blood sugar is less than 70 mg/dL, you will need to treat for low blood sugar: o Do not take insulin. o Treat a low blood sugar (less than 70 mg/dL) with  cup of clear juice (cranberry or apple), 4 glucose tablets, OR glucose gel. o Recheck blood sugar in 15 minutes after treatment (to make sure it is greater than 70 mg/dL). If your blood sugar is not greater than 70 mg/dL on recheck, call [phone] for further instructions. . Report your blood sugar to the short stay nurse when you get to Short Stay.  . If you are admitted to the hospital after surgery: o Your blood sugar will be checked by the staff and you will probably be given insulin after surgery (instead of oral diabetes medicines) to make sure you have good blood sugar  levels. o The goal for blood sugar control after surgery is 80-180 mg/dL.              WHAT DO I DO ABOUT MY DIABETES MEDICATION?   Marland Kitchen Do not take oral diabetes medicines (pills) the morning of surgery.  . THE NIGHT BEFORE SURGERY, take ___________ units of ___________insulin.       Marland Kitchen HE MORNING OF SURGERY, take _____________ units of __________insulin.  . The day of surgery, do not take other diabetes injectables, including Byetta (exenatide), Bydureon (exenatide ER), Victoza (liraglutide), or Trulicity (dulaglutide).  . If your CBG is greater than 220 mg/dL, you may take  of your  sliding scale (correction) dose of insulin.  Other Instructions:          Patient Signature:  Date:   Nurse Signature:  Date:   Reviewed and Endorsed by [facility] Patient Education Committee, August 2015

## 2017-05-22 LAB — HEMOGLOBIN A1C
Hgb A1c MFr Bld: 7.3 % — ABNORMAL HIGH (ref 4.8–5.6)
MEAN PLASMA GLUCOSE: 163 mg/dL

## 2017-05-22 MED ORDER — VANCOMYCIN HCL 10 G IV SOLR
1500.0000 mg | INTRAVENOUS | Status: AC
  Administered 2017-05-23: 1500 mg via INTRAVENOUS
  Filled 2017-05-22: qty 1500

## 2017-05-22 NOTE — H&P (Signed)
PREOPERATIVE H&P  Chief Complaint: Bilateral arm weakness and numbness  HPI: Sandra Ferrell is a 56 y.o. 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 Allergic rhinitis, cause unspecified   . Anxiety state, unspecified   . Backache, unspecified   . Calculus of kidney   . Carpal tunnel syndrome, right   . Cough   . Depressive disorder, not elsewhere classified    managed with medications  . Esophageal reflux   . Extrinsic asthma with exacerbation   . Fibromyalgia   . Heart murmur   . Hypertension   . Osteoarthrosis, unspecified whether generalized or localized, unspecified site   . Other and unspecified hyperlipidemia   . Other screening mammogram   . Personal history of unspecified urinary disorder   . Pneumonia   . Routine general medical examination at a health care facility   . Routine gynecological examination   . Tobacco use disorder   . Type II or unspecified type diabetes mellitus without mention of complication, not stated as uncontrolled   . Unspecified sleep apnea   . Wears glasses    Past Surgical History:  Procedure Laterality Date  . ANTERIOR CERVICAL DECOMP/DISCECTOMY FUSION N/A 01/19/2015   Procedure: ANTERIOR CERVICAL DECOMPRESSION/DISCECTOMY FUSION 2 LEVELS;  Surgeon: Phylliss Bob, MD;  Location: [place];  Service: Orthopedics;  Laterality: N/A;  Anterior cervical decompression fusion, cervical 5-6, cervical 6-7 with instrumentation and allograft  . APPENDECTOMY  1973  . BACK SURGERY    . CHOLECYSTECTOMY  08/2006  . COLONOSCOPY    . TRANSTHORACIC ECHOCARDIOGRAM  02/2011   mild LVH, nl EF, mild diastolic dysfunction, no wall motion abnl    Social History   Social History  . Marital status: Married    Spouse name: N/A  . Number of children: N/A  . Years of education: N/A   Occupational History  . Housewife    Social History Main Topics  . Smoking status: Current Every Day Smoker    Packs/day: 1.00    Years: 35.00    Types: Cigarettes  . Smokeless tobacco: Never Used  . Alcohol use No  . Drug use: No  . Sexual activity: No   Other Topics Concern  . Not on file   Social History Narrative   Moderate diet.    No living will, no HCPOA.   Family History  Problem Relation Age of Onset  . Stroke Father   . Colon cancer Cousin    Allergies  Allergen Reactions  . Benazepril Anaphylaxis, Swelling and Other (See Comments)    Angioedema, throat swelling  . Diclofenac Sodium Other (See Comments)    GI bleed  . Fluticasone-Salmeterol Hives, Itching and Swelling    Tongue was swollen  . Nsaids Other (See Comments)    Rectal bleeding  . Penicillins Hives and Rash    Has patient had a PCN reaction causing immediate rash, facial/tongue/throat swelling, SOB or lightheadedness with hypotension: no Has patient had a PCN reaction causing severe rash involving mucus membranes or skin necrosis: no Has patient had a PCN reaction that required hospitalization no Has patient had a PCN reaction occurring within the last 10 years: no If all of  the above answers are "NO", then may proceed with Cephalosporin use.   . Aspirin Other (See Comments)    Burns stomach  . Propoxyphene Other (See Comments)    Darvocet - Headache   Prior to Admission medications   Medication Sig Start Date End Date Taking? Authorizing Provider  acetaminophen-codeine (TYLENOL #3) 300-30 MG tablet TAKE 1 TABLET EVERY 8 HOURS AS NEEDED FOR PAIN 05/03/17  Yes Bedsole, Amy E, MD  ALPRAZolam (XANAX) 0.5 MG tablet TAKE 1 TABLET THREE TIMES DAILY AS NEEDED Patient taking differently: TAKE 1 TABLET THREE TIMES DAILY 05/03/17  Yes Bedsole, Amy E, MD   amitriptyline (ELAVIL) 50 MG tablet TAKE 1 TABLET(50 MG) BY MOUTH AT BEDTIME Patient taking differently: TAKE 1 TABLET(50 MG) BY MOUTH AT BEDTIME AS NEEDED SLEEP/DEPRESSION. 05/15/16  Yes Bedsole, Amy E, MD  Caffeine-Magnesium Salicylate (DIUREX PO) Take 1 tablet by mouth 3 (three) times daily.   Yes [provider]  chlorpheniramine (CHLOR-TRIMETON) 4 MG tablet Take 8 mg by mouth 2 (two) times daily as needed for allergies (sinus congestion).    Yes [provider]  Chlorpheniramine-Acetaminophen (CORICIDIN HBP COLD/FLU PO) Take 2 tablets by mouth at bedtime.   Yes [provider]  furosemide (LASIX) 20 MG tablet TAKE 1 TABLET(20 MG) BY MOUTH DAILY 05/16/17  Yes Bedsole, Amy E, MD  gabapentin (NEURONTIN) 300 MG capsule TAKE ONE CAPSULE BY MOUTH IN THE MORNING, 1 CAPSULE IN THE AFTERNOON AND 2 CAPSULES AT BEDTIME Patient taking differently: TAKE TWO CAPSULE BY MOUTH IN THE MORNING, AND 2 CAPSULES AT BEDTIME 04/30/17  Yes Bedsole, Amy E, MD  gemfibrozil (LOPID) 600 MG tablet TAKE 1 TABLET BY MOUTH TWICE DAILY 05/16/17  Yes Bedsole, Amy E, MD  guaiFENesin (MUCINEX) 600 MG 12 hr tablet Take 600 mg by mouth 2 (two) times daily.   Yes [provider]  Homeopathic Products (ARNICARE ARNICA) CREA Apply 1 application topically as needed (Apply to ankles for pain.).    Yes [provider]  ibuprofen (ADVIL,MOTRIN) 200 MG tablet Take 600-800 mg by mouth 2 (two) times daily as needed for moderate pain.    Yes [provider]  LANTUS SOLOSTAR 100 UNIT/ML Solostar Pen ADMINISTER 30 UNITS UNDER THE SKIN AT BEDTIME Patient taking differently: 45 units subcutaneously at night as needed for blood sugars greater than 140 07/31/16  Yes Bedsole, Amy E, MD  metFORMIN (GLUCOPHAGE) 1000 MG tablet TAKE 1 TABLET(1000 MG) BY MOUTH TWICE DAILY WITH A MEAL 05/06/17  Yes Bedsole, Amy E, MD  methocarbamol (ROBAXIN) 500 MG tablet TAKE 2 TABLETS BY MOUTH EVERY NIGHT AT  BEDTIME Patient taking differently: TAKE 1 TABLET BY MOUTH THREE TIMES DAILY 05/03/17  Yes Bedsole, Amy E, MD  Phenylephrine-Acetaminophen (TYLENOL SINUS+HEADACHE PO) Take 2 tablets by mouth 2 (two) times daily.   Yes [provider]  pioglitazone (ACTOS) 45 MG tablet TAKE 1 TABLET BY MOUTH EVERY DAY 03/05/17  Yes Bedsole, Amy E, MD  PROAIR HFA 108 (90 Base) MCG/ACT inhaler INHALE 2 PUFFS BY MOUTH EVERY 4 HOURS AS NEEDED FOR WHEEZING OR SHORTNESS OF BREATH 02/20/17  Yes Bedsole, Amy E, MD  ranitidine (ZANTAC) 150 MG tablet Take 150 mg by mouth 2 (two) times daily.   Yes [provider]  sodium chloride (OCEAN) 0.65 % SOLN nasal spray Place 2 sprays into both nostrils as needed for congestion.   Yes [provider]  SYMBICORT 80-4.5 MCG/ACT inhaler INHALE 2 PUFFS BY MOUTH TWICE DAILY 12/07/16  Yes Bedsole, Amy  E, MD  ACCU-CHEK AVIVA PLUS test strip CHECK BLOOD SUGAR TWICE DAILY AND AS DIRECTED 11/08/16   Bedsole, Amy E, MD  ACCU-CHEK SOFTCLIX LANCETS lancets CHECK BLOOD SUGAR TWICE DAILY AND AS DIRECTED 11/08/16   Bedsole, Amy E, MD  acetaminophen (TYLENOL) 500 MG tablet Take 1,000 mg by mouth every 6 (six) hours as needed (for pain.).    [provider]  AIMSCO INSULIN SYR ULTRA THIN 31G X 5/16" 0.3 ML MISC  04/02/15   [provider]  Alcohol Swabs PADS Check blood sugar twice a day and as directed. Dx E11.9 11/03/15   Jinny Sanders, MD  aspirin EC 81 MG tablet Take 81 mg by mouth daily.    [provider]  B-D ULTRAFINE III SHORT PEN 31G X 8 MM MISC USE AS DIRECTED WITH INSULIN PEN Patient taking differently: USE DAILY WITH INSULIN PEN AS NEEDED FOR HIGH BLOOD GLUCOSE READINGS 09/18/16   Bedsole, Amy E, MD  Blood Glucose Monitoring Suppl (ACCU-CHEK AVIVA PLUS) w/Device KIT Check blood sugar twice daily and as directed. 02/07/17   Jinny Sanders, MD  Lancet Devices (ADJUSTABLE LANCING DEVICE) MISC Check blood sugar twice a day and as directed. Dx E11.9  11/03/15   Jinny Sanders, MD  VICTOZA 18 MG/3ML SOPN ADMINISTER 1.8 MG UNDER THE SKIN DAILY Patient not taking: Reported on 04/23/2017 10/17/15   Jinny Sanders, MD     All other systems have been reviewed and were otherwise negative with the exception of those mentioned in the HPI and as above.  Physical Exam: There were no vitals filed for this visit.  General: Alert, no acute distress Cardiovascular: No pedal edema Respiratory: No cyanosis, no use of accessory musculature Skin: No lesions in the area of chief complaint Neurologic: Sensation intact distally Psychiatric: Patient is competent for consent with normal mood and affect Lymphatic: No axillary or cervical lymphadenopathy  MUSCULOSKELETAL: + UE hyperreflexia  Assessment/Plan: Cervical myelopathy Plan for Procedure(s): ANTERIOR CERVICAL DECOMPRESSION FUSION, CERVICAL 4-5 WITH INSTRUMENTATION AND ALLOGRAFT   Sinclair Ship, MD 05/22/2017 12:04 PM

## 2017-05-23 ENCOUNTER — Encounter (HOSPITAL_COMMUNITY)
Admission: RE | Disposition: A | Discharge status: Home / Self Care | Payer: Self-pay | Source: Ambulatory Visit | Attending: Orthopedic Surgery

## 2017-05-23 ENCOUNTER — Ambulatory Visit (HOSPITAL_COMMUNITY): Payer: Medicare Other | Admitting: Anesthesiology

## 2017-05-23 ENCOUNTER — Ambulatory Visit (HOSPITAL_COMMUNITY): Payer: Medicare Other

## 2017-05-23 ENCOUNTER — Observation Stay (HOSPITAL_COMMUNITY)
Admission: RE | Admit: 2017-05-23 | Discharge: 2017-05-24 | Disposition: A | Discharge status: Home / Self Care | Payer: Medicare Other | Source: Ambulatory Visit | Attending: Orthopedic Surgery | Admitting: Orthopedic Surgery

## 2017-05-23 ENCOUNTER — Encounter (HOSPITAL_COMMUNITY): Payer: Self-pay | Admitting: *Deleted

## 2017-05-23 DIAGNOSIS — G9529 Other cord compression: Secondary | ICD-10-CM | POA: Diagnosis not present

## 2017-05-23 DIAGNOSIS — Z79899 Other long term (current) drug therapy: Secondary | ICD-10-CM | POA: Diagnosis not present

## 2017-05-23 DIAGNOSIS — G9589 Other specified diseases of spinal cord: Secondary | ICD-10-CM | POA: Diagnosis not present

## 2017-05-23 DIAGNOSIS — I10 Essential (primary) hypertension: Secondary | ICD-10-CM | POA: Insufficient documentation

## 2017-05-23 DIAGNOSIS — Z419 Encounter for procedure for purposes other than remedying health state, unspecified: Secondary | ICD-10-CM

## 2017-05-23 DIAGNOSIS — M4712 Other spondylosis with myelopathy, cervical region: Secondary | ICD-10-CM

## 2017-05-23 DIAGNOSIS — Z794 Long term (current) use of insulin: Secondary | ICD-10-CM | POA: Diagnosis not present

## 2017-05-23 DIAGNOSIS — Z981 Arthrodesis status: Secondary | ICD-10-CM | POA: Insufficient documentation

## 2017-05-23 DIAGNOSIS — F17210 Nicotine dependence, cigarettes, uncomplicated: Secondary | ICD-10-CM | POA: Diagnosis not present

## 2017-05-23 DIAGNOSIS — J45909 Unspecified asthma, uncomplicated: Secondary | ICD-10-CM | POA: Diagnosis not present

## 2017-05-23 DIAGNOSIS — Z472 Encounter for removal of internal fixation device: Secondary | ICD-10-CM | POA: Diagnosis not present

## 2017-05-23 DIAGNOSIS — G959 Disease of spinal cord, unspecified: Secondary | ICD-10-CM | POA: Diagnosis present

## 2017-05-23 DIAGNOSIS — K219 Gastro-esophageal reflux disease without esophagitis: Secondary | ICD-10-CM | POA: Diagnosis not present

## 2017-05-23 DIAGNOSIS — Z7982 Long term (current) use of aspirin: Secondary | ICD-10-CM | POA: Diagnosis not present

## 2017-05-23 DIAGNOSIS — M5412 Radiculopathy, cervical region: Secondary | ICD-10-CM | POA: Insufficient documentation

## 2017-05-23 DIAGNOSIS — J449 Chronic obstructive pulmonary disease, unspecified: Secondary | ICD-10-CM | POA: Insufficient documentation

## 2017-05-23 DIAGNOSIS — R531 Weakness: Secondary | ICD-10-CM | POA: Diagnosis present

## 2017-05-23 DIAGNOSIS — G4730 Sleep apnea, unspecified: Secondary | ICD-10-CM | POA: Diagnosis not present

## 2017-05-23 DIAGNOSIS — G9520 Unspecified cord compression: Secondary | ICD-10-CM | POA: Insufficient documentation

## 2017-05-23 DIAGNOSIS — F419 Anxiety disorder, unspecified: Secondary | ICD-10-CM | POA: Diagnosis not present

## 2017-05-23 DIAGNOSIS — Z7951 Long term (current) use of inhaled steroids: Secondary | ICD-10-CM | POA: Diagnosis not present

## 2017-05-23 DIAGNOSIS — E119 Type 2 diabetes mellitus without complications: Secondary | ICD-10-CM | POA: Insufficient documentation

## 2017-05-23 DIAGNOSIS — F329 Major depressive disorder, single episode, unspecified: Secondary | ICD-10-CM | POA: Insufficient documentation

## 2017-05-23 DIAGNOSIS — M4322 Fusion of spine, cervical region: Secondary | ICD-10-CM | POA: Diagnosis not present

## 2017-05-23 HISTORY — PX: ANTERIOR CERVICAL DECOMP/DISCECTOMY FUSION: SHX1161

## 2017-05-23 LAB — GLUCOSE, CAPILLARY
GLUCOSE-CAPILLARY: 160 mg/dL — AB (ref 65–99)
GLUCOSE-CAPILLARY: 163 mg/dL — AB (ref 65–99)
GLUCOSE-CAPILLARY: 196 mg/dL — AB (ref 65–99)
Glucose-Capillary: 136 mg/dL — ABNORMAL HIGH (ref 65–99)
Glucose-Capillary: 189 mg/dL — ABNORMAL HIGH (ref 65–99)

## 2017-05-23 SURGERY — ANTERIOR CERVICAL DECOMPRESSION/DISCECTOMY FUSION 1 LEVEL
Anesthesia: General | Site: Neck

## 2017-05-23 MED ORDER — DIAZEPAM 5 MG PO TABS
5.0000 mg | ORAL_TABLET | Freq: Four times a day (QID) | ORAL | Status: DC | PRN
  Administered 2017-05-23 – 2017-05-24 (×2): 5 mg via ORAL
  Filled 2017-05-23 (×2): qty 1

## 2017-05-23 MED ORDER — POVIDONE-IODINE 7.5 % EX SOLN: Freq: Once | CUTANEOUS | Status: DC

## 2017-05-23 MED ORDER — LACTATED RINGERS IV SOLN: INTRAVENOUS | Status: DC

## 2017-05-23 MED ORDER — ACETAMINOPHEN 650 MG RE SUPP
650.0000 mg | RECTAL | Status: DC | PRN
Start: 2017-05-23 — End: 2017-05-24

## 2017-05-23 MED ORDER — SENNOSIDES-DOCUSATE SODIUM 8.6-50 MG PO TABS: 1.0000 | ORAL_TABLET | Freq: Every evening | ORAL | Status: DC | PRN

## 2017-05-23 MED ORDER — AMITRIPTYLINE HCL 50 MG PO TABS
50.0000 mg | ORAL_TABLET | Freq: Every evening | ORAL | Status: DC | PRN
  Filled 2017-05-23: qty 1

## 2017-05-23 MED ORDER — FENTANYL CITRATE (PF) 250 MCG/5ML IJ SOLN
INTRAMUSCULAR | Status: AC
  Filled 2017-05-23: qty 5

## 2017-05-23 MED ORDER — ONDANSETRON HCL 4 MG/2ML IJ SOLN
INTRAMUSCULAR | Status: AC
  Filled 2017-05-23: qty 2

## 2017-05-23 MED ORDER — PHENYLEPHRINE 40 MCG/ML (10ML) SYRINGE FOR IV PUSH (FOR BLOOD PRESSURE SUPPORT)
PREFILLED_SYRINGE | INTRAVENOUS | Status: DC | PRN
  Administered 2017-05-23: 80 ug via INTRAVENOUS

## 2017-05-23 MED ORDER — THROMBIN 20000 UNITS EX KIT
PACK | CUTANEOUS | Status: AC
  Filled 2017-05-23: qty 1

## 2017-05-23 MED ORDER — ALBUTEROL SULFATE HFA 108 (90 BASE) MCG/ACT IN AERS
INHALATION_SPRAY | RESPIRATORY_TRACT | Status: DC | PRN
  Administered 2017-05-23: 5 via RESPIRATORY_TRACT

## 2017-05-23 MED ORDER — SODIUM CHLORIDE 0.9% FLUSH: 3.0000 mL | INTRAVENOUS | Status: DC | PRN

## 2017-05-23 MED ORDER — SODIUM CHLORIDE 0.9 % IV SOLN: 250.0000 mL | INTRAVENOUS | Status: DC

## 2017-05-23 MED ORDER — MEPERIDINE HCL 25 MG/ML IJ SOLN: 6.2500 mg | INTRAMUSCULAR | Status: DC | PRN

## 2017-05-23 MED ORDER — ONDANSETRON HCL 4 MG/2ML IJ SOLN: 4.0000 mg | Freq: Four times a day (QID) | INTRAMUSCULAR | Status: DC | PRN

## 2017-05-23 MED ORDER — OXYCODONE-ACETAMINOPHEN 5-325 MG PO TABS
1.0000 | ORAL_TABLET | ORAL | Status: DC | PRN
  Administered 2017-05-23 – 2017-05-24 (×4): 2 via ORAL
  Filled 2017-05-23 (×4): qty 2

## 2017-05-23 MED ORDER — ACETAMINOPHEN 325 MG PO TABS: 650.0000 mg | ORAL_TABLET | ORAL | Status: DC | PRN

## 2017-05-23 MED ORDER — LIDOCAINE 2% (20 MG/ML) 5 ML SYRINGE
INTRAMUSCULAR | Status: DC | PRN
  Administered 2017-05-23: 60 mg via INTRAVENOUS

## 2017-05-23 MED ORDER — PHENOL 1.4 % MT LIQD: 1.0000 | OROMUCOSAL | Status: DC | PRN

## 2017-05-23 MED ORDER — ALPRAZOLAM 0.5 MG PO TABS
0.5000 mg | ORAL_TABLET | Freq: Three times a day (TID) | ORAL | Status: DC
  Administered 2017-05-23 – 2017-05-24 (×2): 0.5 mg via ORAL
  Filled 2017-05-23 (×2): qty 1

## 2017-05-23 MED ORDER — PROPOFOL 10 MG/ML IV BOLUS
INTRAVENOUS | Status: DC | PRN
  Administered 2017-05-23: 150 mg via INTRAVENOUS

## 2017-05-23 MED ORDER — DOCUSATE SODIUM 100 MG PO CAPS
100.0000 mg | ORAL_CAPSULE | Freq: Two times a day (BID) | ORAL | Status: DC
  Administered 2017-05-23 – 2017-05-24 (×2): 100 mg via ORAL
  Filled 2017-05-23 (×2): qty 1

## 2017-05-23 MED ORDER — PANTOPRAZOLE SODIUM 40 MG PO TBEC
40.0000 mg | DELAYED_RELEASE_TABLET | Freq: Every day | ORAL | Status: DC
  Administered 2017-05-23: 40 mg via ORAL
  Filled 2017-05-23: qty 1

## 2017-05-23 MED ORDER — DEXAMETHASONE SODIUM PHOSPHATE 10 MG/ML IJ SOLN
INTRAMUSCULAR | Status: AC
  Filled 2017-05-23: qty 1

## 2017-05-23 MED ORDER — SUCCINYLCHOLINE CHLORIDE 200 MG/10ML IV SOSY
PREFILLED_SYRINGE | INTRAVENOUS | Status: AC
  Filled 2017-05-23: qty 10

## 2017-05-23 MED ORDER — MINERAL OIL LIGHT 100 % EX OIL
TOPICAL_OIL | CUTANEOUS | Status: AC
  Filled 2017-05-23: qty 25

## 2017-05-23 MED ORDER — DIPHENHYDRAMINE HCL 25 MG PO TABS: 25.0000 mg | ORAL_TABLET | Freq: Four times a day (QID) | ORAL | Status: DC

## 2017-05-23 MED ORDER — ROCURONIUM BROMIDE 10 MG/ML (PF) SYRINGE
PREFILLED_SYRINGE | INTRAVENOUS | Status: DC | PRN
  Administered 2017-05-23: 20 mg via INTRAVENOUS
  Administered 2017-05-23: 50 mg via INTRAVENOUS
  Administered 2017-05-23: 10 mg via INTRAVENOUS

## 2017-05-23 MED ORDER — ZOLPIDEM TARTRATE 5 MG PO TABS: 5.0000 mg | ORAL_TABLET | Freq: Every evening | ORAL | Status: DC | PRN

## 2017-05-23 MED ORDER — FLEET ENEMA 7-19 GM/118ML RE ENEM: 1.0000 | ENEMA | Freq: Once | RECTAL | Status: DC | PRN

## 2017-05-23 MED ORDER — GUAIFENESIN ER 600 MG PO TB12
600.0000 mg | ORAL_TABLET | Freq: Two times a day (BID) | ORAL | Status: DC
  Administered 2017-05-24: 600 mg via ORAL
  Filled 2017-05-23 (×2): qty 1

## 2017-05-23 MED ORDER — FENTANYL CITRATE (PF) 100 MCG/2ML IJ SOLN
25.0000 ug | INTRAMUSCULAR | Status: DC | PRN
  Administered 2017-05-23: 50 ug via INTRAVENOUS

## 2017-05-23 MED ORDER — INSULIN GLARGINE 100 UNIT/ML ~~LOC~~ SOLN
30.0000 [IU] | Freq: Every day | SUBCUTANEOUS | Status: DC
  Administered 2017-05-23: 30 [IU] via SUBCUTANEOUS
  Filled 2017-05-23: qty 0.3

## 2017-05-23 MED ORDER — SUGAMMADEX SODIUM 200 MG/2ML IV SOLN
INTRAVENOUS | Status: AC
  Filled 2017-05-23: qty 2

## 2017-05-23 MED ORDER — INSULIN ASPART 100 UNIT/ML ~~LOC~~ SOLN
0.0000 [IU] | Freq: Three times a day (TID) | SUBCUTANEOUS | Status: DC
  Administered 2017-05-23: 3 [IU] via SUBCUTANEOUS
  Administered 2017-05-24: 2 [IU] via SUBCUTANEOUS

## 2017-05-23 MED ORDER — ALBUTEROL SULFATE (2.5 MG/3ML) 0.083% IN NEBU: 3.0000 mL | INHALATION_SOLUTION | RESPIRATORY_TRACT | Status: DC | PRN

## 2017-05-23 MED ORDER — SODIUM CHLORIDE 0.9% FLUSH: 3.0000 mL | Freq: Two times a day (BID) | INTRAVENOUS | Status: DC

## 2017-05-23 MED ORDER — MIDAZOLAM HCL 2 MG/2ML IJ SOLN
INTRAMUSCULAR | Status: AC
  Filled 2017-05-23: qty 2

## 2017-05-23 MED ORDER — ONDANSETRON HCL 4 MG PO TABS: 4.0000 mg | ORAL_TABLET | Freq: Four times a day (QID) | ORAL | Status: DC | PRN

## 2017-05-23 MED ORDER — LIDOCAINE HCL (CARDIAC) 20 MG/ML IV SOLN
INTRAVENOUS | Status: AC
  Filled 2017-05-23: qty 5

## 2017-05-23 MED ORDER — PHENYLEPHRINE 40 MCG/ML (10ML) SYRINGE FOR IV PUSH (FOR BLOOD PRESSURE SUPPORT)
PREFILLED_SYRINGE | INTRAVENOUS | Status: AC
  Filled 2017-05-23: qty 10

## 2017-05-23 MED ORDER — BUPIVACAINE-EPINEPHRINE 0.25% -1:200000 IJ SOLN
INTRAMUSCULAR | Status: DC | PRN
  Administered 2017-05-23: 10 mL

## 2017-05-23 MED ORDER — MIDAZOLAM HCL 2 MG/2ML IJ SOLN
INTRAMUSCULAR | Status: DC | PRN
Start: 2017-05-23 — End: 2017-05-23
  Administered 2017-05-23: 2 mg via INTRAVENOUS

## 2017-05-23 MED ORDER — SUCCINYLCHOLINE CHLORIDE 200 MG/10ML IV SOSY
PREFILLED_SYRINGE | INTRAVENOUS | Status: DC | PRN
  Administered 2017-05-23: 120 mg via INTRAVENOUS

## 2017-05-23 MED ORDER — GEMFIBROZIL 600 MG PO TABS
600.0000 mg | ORAL_TABLET | Freq: Two times a day (BID) | ORAL | Status: DC
  Administered 2017-05-23 – 2017-05-24 (×2): 600 mg via ORAL
  Filled 2017-05-23 (×2): qty 1

## 2017-05-23 MED ORDER — MORPHINE SULFATE (PF) 4 MG/ML IV SOLN: 1.0000 mg | INTRAVENOUS | Status: DC | PRN

## 2017-05-23 MED ORDER — 0.9 % SODIUM CHLORIDE (POUR BTL) OPTIME
TOPICAL | Status: DC | PRN
  Administered 2017-05-23: 1000 mL

## 2017-05-23 MED ORDER — PROPOFOL 10 MG/ML IV BOLUS
INTRAVENOUS | Status: AC
  Filled 2017-05-23: qty 20

## 2017-05-23 MED ORDER — ALBUTEROL SULFATE HFA 108 (90 BASE) MCG/ACT IN AERS
INHALATION_SPRAY | RESPIRATORY_TRACT | Status: AC
  Filled 2017-05-23: qty 6.7

## 2017-05-23 MED ORDER — FENTANYL CITRATE (PF) 250 MCG/5ML IJ SOLN
INTRAMUSCULAR | Status: DC | PRN
  Administered 2017-05-23 (×2): 50 ug via INTRAVENOUS
  Administered 2017-05-23 (×2): 100 ug via INTRAVENOUS
  Administered 2017-05-23: 50 ug via INTRAVENOUS
  Administered 2017-05-23: 150 ug via INTRAVENOUS

## 2017-05-23 MED ORDER — GABAPENTIN 300 MG PO CAPS
600.0000 mg | ORAL_CAPSULE | Freq: Two times a day (BID) | ORAL | Status: DC
  Administered 2017-05-23 – 2017-05-24 (×2): 600 mg via ORAL
  Filled 2017-05-23 (×2): qty 2

## 2017-05-23 MED ORDER — SUGAMMADEX SODIUM 200 MG/2ML IV SOLN
INTRAVENOUS | Status: DC | PRN
Start: 2017-05-23 — End: 2017-05-23
  Administered 2017-05-23: 200 mg via INTRAVENOUS

## 2017-05-23 MED ORDER — METFORMIN HCL 500 MG PO TABS
1000.0000 mg | ORAL_TABLET | Freq: Two times a day (BID) | ORAL | Status: DC
Start: 2017-05-23 — End: 2017-05-24
  Administered 2017-05-23 – 2017-05-24 (×2): 1000 mg via ORAL
  Filled 2017-05-23 (×2): qty 2

## 2017-05-23 MED ORDER — THROMBIN 20000 UNITS EX SOLR
CUTANEOUS | Status: AC
  Filled 2017-05-23: qty 20000

## 2017-05-23 MED ORDER — MENTHOL 3 MG MT LOZG: 1.0000 | LOZENGE | OROMUCOSAL | Status: DC | PRN

## 2017-05-23 MED ORDER — ROCURONIUM BROMIDE 50 MG/5ML IV SOLN
INTRAVENOUS | Status: AC
  Filled 2017-05-23: qty 1

## 2017-05-23 MED ORDER — VANCOMYCIN HCL IN DEXTROSE 1-5 GM/200ML-% IV SOLN
1000.0000 mg | Freq: Two times a day (BID) | INTRAVENOUS | Status: AC
  Administered 2017-05-23: 1000 mg via INTRAVENOUS
  Filled 2017-05-23: qty 200

## 2017-05-23 MED ORDER — THROMBIN 20000 UNITS EX SOLR
CUTANEOUS | Status: DC | PRN
  Administered 2017-05-23: 20 mL via TOPICAL

## 2017-05-23 MED ORDER — PIOGLITAZONE HCL 45 MG PO TABS
45.0000 mg | ORAL_TABLET | Freq: Every day | ORAL | Status: DC
Start: 2017-05-23 — End: 2017-05-24
  Administered 2017-05-24: 45 mg via ORAL
  Filled 2017-05-23 (×2): qty 1

## 2017-05-23 MED ORDER — BISACODYL 5 MG PO TBEC: 5.0000 mg | DELAYED_RELEASE_TABLET | Freq: Every day | ORAL | Status: DC | PRN

## 2017-05-23 MED ORDER — PANTOPRAZOLE SODIUM 40 MG IV SOLR: 40.0000 mg | Freq: Every day | INTRAVENOUS | Status: DC

## 2017-05-23 MED ORDER — FENTANYL CITRATE (PF) 100 MCG/2ML IJ SOLN
INTRAMUSCULAR | Status: AC
  Filled 2017-05-23: qty 2

## 2017-05-23 MED ORDER — LACTATED RINGERS IV SOLN
INTRAVENOUS | Status: DC | PRN
  Administered 2017-05-23 (×2): via INTRAVENOUS

## 2017-05-23 MED ORDER — PROMETHAZINE HCL 25 MG/ML IJ SOLN: 6.2500 mg | INTRAMUSCULAR | Status: DC | PRN

## 2017-05-23 MED ORDER — ALUM & MAG HYDROXIDE-SIMETH 200-200-20 MG/5ML PO SUSP: 30.0000 mL | Freq: Four times a day (QID) | ORAL | Status: DC | PRN

## 2017-05-23 MED ORDER — SALINE SPRAY 0.65 % NA SOLN
2.0000 | NASAL | Status: DC | PRN
  Filled 2017-05-23: qty 44

## 2017-05-23 MED ORDER — DEXAMETHASONE SODIUM PHOSPHATE 10 MG/ML IJ SOLN
INTRAMUSCULAR | Status: DC | PRN
  Administered 2017-05-23: 10 mg via INTRAVENOUS

## 2017-05-23 MED ORDER — FUROSEMIDE 20 MG PO TABS
20.0000 mg | ORAL_TABLET | Freq: Every day | ORAL | Status: DC
  Administered 2017-05-24: 20 mg via ORAL
  Filled 2017-05-23 (×2): qty 1

## 2017-05-23 MED ORDER — ONDANSETRON HCL 4 MG/2ML IJ SOLN
INTRAMUSCULAR | Status: DC | PRN
  Administered 2017-05-23: 4 mg via INTRAVENOUS

## 2017-05-23 MED ORDER — MOMETASONE FURO-FORMOTEROL FUM 100-5 MCG/ACT IN AERO
2.0000 | INHALATION_SPRAY | Freq: Two times a day (BID) | RESPIRATORY_TRACT | Status: DC
  Administered 2017-05-23: 2 via RESPIRATORY_TRACT
  Filled 2017-05-23: qty 8.8

## 2017-05-23 MED ORDER — CAFFEINE-MAGNESIUM SALICYLATE 50-162.5 MG PO TABS: ORAL_TABLET | Freq: Three times a day (TID) | ORAL | Status: DC

## 2017-05-23 SURGICAL SUPPLY — 68 items
APL SKNCLS STERI-STRIP NONHPOA (GAUZE/BANDAGES/DRESSINGS) ×1
BENZOIN TINCTURE PRP APPL 2/3 (GAUZE/BANDAGES/DRESSINGS) ×2 IMPLANT
BIT DRILL NEURO 2X3.1 SFT TUCH (MISCELLANEOUS) ×1 IMPLANT
BLADE CLIPPER SURG (BLADE) ×2 IMPLANT
BLADE SURG 15 STRL LF DISP TIS (BLADE) ×1 IMPLANT
BLADE SURG 15 STRL SS (BLADE) ×2
BONE VIVIGEN FORMABLE 1.3CC (Bone Implant) ×2 IMPLANT
BUR MATCHSTICK NEURO 3.0 LAGG (BURR) IMPLANT
CARTRIDGE OIL MAESTRO DRILL (MISCELLANEOUS) ×1 IMPLANT
CORDS BIPOLAR (ELECTRODE) ×2 IMPLANT
COVER SURGICAL LIGHT HANDLE (MISCELLANEOUS) ×2 IMPLANT
CRADLE DONUT ADULT HEAD (MISCELLANEOUS) ×2 IMPLANT
DIFFUSER DRILL AIR PNEUMATIC (MISCELLANEOUS) ×2 IMPLANT
DRAIN JACKSON RD 7FR 3/32 (WOUND CARE) IMPLANT
DRAPE C-ARM 42X72 X-RAY (DRAPES) ×2 IMPLANT
DRAPE POUCH INSTRU U-SHP 10X18 (DRAPES) ×2 IMPLANT
DRAPE SURG 17X23 STRL (DRAPES) ×6 IMPLANT
DRILL NEURO 2X3.1 SOFT TOUCH (MISCELLANEOUS) ×2
DURAPREP 26ML APPLICATOR (WOUND CARE) ×2 IMPLANT
ELECT COATED BLADE 2.86 ST (ELECTRODE) ×2 IMPLANT
ELECT REM PT RETURN 9FT ADLT (ELECTROSURGICAL) ×2
ELECTRODE REM PT RTRN 9FT ADLT (ELECTROSURGICAL) ×1 IMPLANT
EVACUATOR SILICONE 100CC (DRAIN) IMPLANT
GAUZE SPONGE 4X4 12PLY STRL (GAUZE/BANDAGES/DRESSINGS) ×2 IMPLANT
GAUZE SPONGE 4X4 12PLY STRL LF (GAUZE/BANDAGES/DRESSINGS) ×2 IMPLANT
GAUZE SPONGE 4X4 16PLY XRAY LF (GAUZE/BANDAGES/DRESSINGS) ×2 IMPLANT
GLOVE BIO SURGEON STRL SZ7 (GLOVE) ×2 IMPLANT
GLOVE BIO SURGEON STRL SZ8 (GLOVE) ×2 IMPLANT
GLOVE BIOGEL PI IND STRL 7.0 (GLOVE) ×2 IMPLANT
GLOVE BIOGEL PI IND STRL 8 (GLOVE) ×1 IMPLANT
GLOVE BIOGEL PI INDICATOR 7.0 (GLOVE) ×2
GLOVE BIOGEL PI INDICATOR 8 (GLOVE) ×1
GOWN STRL REUS W/ TWL LRG LVL3 (GOWN DISPOSABLE) ×1 IMPLANT
GOWN STRL REUS W/ TWL XL LVL3 (GOWN DISPOSABLE) ×1 IMPLANT
GOWN STRL REUS W/TWL LRG LVL3 (GOWN DISPOSABLE) ×2
GOWN STRL REUS W/TWL XL LVL3 (GOWN DISPOSABLE) ×2
IV CATH 14GX2 1/4 (CATHETERS) ×4 IMPLANT
KIT BASIN OR (CUSTOM PROCEDURE TRAY) ×2 IMPLANT
KIT ROOM TURNOVER OR (KITS) ×2 IMPLANT
MANIFOLD NEPTUNE II (INSTRUMENTS) ×2 IMPLANT
NEEDLE PRECISIONGLIDE 27X1.5 (NEEDLE) ×2 IMPLANT
NEEDLE SPNL 20GX3.5 QUINCKE YW (NEEDLE) ×2 IMPLANT
NS IRRIG 1000ML POUR BTL (IV SOLUTION) ×2 IMPLANT
OIL CARTRIDGE MAESTRO DRILL (MISCELLANEOUS) ×2
PACK ORTHO CERVICAL (CUSTOM PROCEDURE TRAY) ×2 IMPLANT
PAD ARMBOARD 7.5X6 YLW CONV (MISCELLANEOUS) ×4 IMPLANT
PATTIES SURGICAL .5 X.5 (GAUZE/BANDAGES/DRESSINGS) IMPLANT
PATTIES SURGICAL .5 X1 (DISPOSABLE) IMPLANT
PEEK LORDOTIC 8MM (Peek) ×2 IMPLANT
PIN DISTRACTION 14 (PIN) ×4 IMPLANT
SCREW SELF DRILLING 16MM (Screw) ×4 IMPLANT
SPONGE INTESTINAL PEANUT (DISPOSABLE) ×4 IMPLANT
SPONGE SURGIFOAM ABS GEL 100 (HEMOSTASIS) ×2 IMPLANT
STRIP CLOSURE SKIN 1/2X4 (GAUZE/BANDAGES/DRESSINGS) ×2 IMPLANT
SURGIFLO W/THROMBIN 8M KIT (HEMOSTASIS) IMPLANT
SUT MNCRL AB 4-0 PS2 18 (SUTURE) ×2 IMPLANT
SUT SILK 4 0 (SUTURE)
SUT SILK 4-0 18XBRD TIE 12 (SUTURE) IMPLANT
SUT VIC AB 2-0 CT2 18 VCP726D (SUTURE) ×2 IMPLANT
SYR BULB IRRIGATION 50ML (SYRINGE) ×2 IMPLANT
SYR CONTROL 10ML LL (SYRINGE) ×4 IMPLANT
TAPE CLOTH 4X10 WHT NS (GAUZE/BANDAGES/DRESSINGS) ×2 IMPLANT
TAPE CLOTH SURG 4X10 WHT LF (GAUZE/BANDAGES/DRESSINGS) ×2 IMPLANT
TAPE UMBILICAL COTTON 1/8X30 (MISCELLANEOUS) ×2 IMPLANT
TOWEL OR 17X24 6PK STRL BLUE (TOWEL DISPOSABLE) ×2 IMPLANT
TOWEL OR 17X26 10 PK STRL BLUE (TOWEL DISPOSABLE) ×2 IMPLANT
WATER STERILE IRR 1000ML POUR (IV SOLUTION) ×2 IMPLANT
YANKAUER SUCT BULB TIP NO VENT (SUCTIONS) ×2 IMPLANT

## 2017-05-23 NOTE — Anesthesia Postprocedure Evaluation (Signed)
Anesthesia Post Note  Patient: Sandra Ferrell  Procedure(s) Performed: Procedure(s) (LRB): ANTERIOR CERVICAL DECOMPRESSION FUSION, CERVICAL 4-5 WITH INSTRUMENTATION AND ALLOGRAFT (N/A)     Patient location during evaluation: PACU Anesthesia Type: General Level of consciousness: awake and alert Pain management: pain level controlled Vital Signs Assessment: post-procedure vital signs reviewed and stable Respiratory status: spontaneous breathing, nonlabored ventilation, respiratory function stable and patient connected to nasal cannula oxygen Cardiovascular status: blood pressure returned to baseline and stable Postop Assessment: no signs of nausea or vomiting Anesthetic complications: no    Last Vitals:  Vitals:   05/23/17 1500 05/23/17 1555  BP:  (!) 167/78  Pulse: (!) 102 (!) 102  Resp: 16 16  Temp: 36.7 C 36.7 C    Last Pain:  Vitals:   05/23/17 1500  TempSrc:   PainSc: 2                  Effie Berkshire

## 2017-05-23 NOTE — Anesthesia Procedure Notes (Signed)
Procedure Name: Intubation Date/Time: 05/23/2017 10:02 AM Performed by: Teressa Lower Pre-anesthesia Checklist: Patient identified, Emergency Drugs available, Suction available and Patient being monitored Patient Re-evaluated:Patient Re-evaluated prior to induction Oxygen Delivery Method: Circle system utilized Preoxygenation: Pre-oxygenation with 100% oxygen Induction Type: IV induction Ventilation: Mask ventilation without difficulty Laryngoscope Size: Glidescope and 3 Grade View: Grade I Tube type: Oral Tube size: 7.0 mm Number of attempts: 1 Airway Equipment and Method: Stylet and Oral airway Placement Confirmation: ETT inserted through vocal cords under direct vision,  positive ETCO2 and breath sounds checked- equal and bilateral Secured at: 21 cm Tube secured with: Tape Dental Injury: Teeth and Oropharynx as per pre-operative assessment

## 2017-05-23 NOTE — Transfer of Care (Signed)
Immediate Anesthesia Transfer of Care Note  Patient: Sandra Ferrell  Procedure(s) Performed: Procedure(s) with comments: ANTERIOR CERVICAL DECOMPRESSION FUSION, CERVICAL 4-5 WITH INSTRUMENTATION AND ALLOGRAFT (N/A) - ANTERIOR CERVICAL DECOMPRESSION FUSION, CERVICAL 4-5 WITH INSTRUMENTATION AND ALLOGRAFT; REQUEST 2 HOURS AND FLIP ROOM  Patient Location: PACU  Anesthesia Type:General  Level of Consciousness: awake, alert  and oriented  Airway & Oxygen Therapy: Patient Spontanous Breathing and Patient connected to face mask oxygen  Post-op Assessment: Report given to RN and Post -op Vital signs reviewed and stable  Post vital signs: Reviewed and stable  Last Vitals:  Vitals:   05/23/17 0749  BP: (!) 151/50  Pulse: 91  Resp: 18  Temp: 36.8 C    Last Pain:  Vitals:   05/23/17 0749  TempSrc: Oral         Complications: No apparent anesthesia complications

## 2017-05-23 NOTE — Progress Notes (Signed)
Orthopedic Tech Progress Note Patient Details:  Sandra Ferrell 09-16-1961 [id number]  Ortho Devices Type of Ortho Device: [place] cervical collar Ortho Device/Splint Location: at bedside  Ortho Device/Splint Interventions: [name] 05/23/2017, 6:55 PM

## 2017-05-23 NOTE — Anesthesia Preprocedure Evaluation (Addendum)
Anesthesia Evaluation  Patient identified by MRN, date of birth, ID band Patient awake    Reviewed: Allergy & Precautions, Patient's Chart, lab work & pertinent test results  Airway Mallampati: I       Dental  (+) Edentulous Upper, Edentulous Lower   Pulmonary asthma , sleep apnea , COPD, Current Smoker,    breath sounds clear to auscultation       Cardiovascular hypertension, negative cardio ROS   Rhythm:Regular Rate:Normal     Neuro/Psych PSYCHIATRIC DISORDERS Anxiety Depression  Neuromuscular disease negative neurological ROS     GI/Hepatic Neg liver ROS, GERD  Medicated,  Endo/Other  negative endocrine ROSdiabetes, Type 2, Oral Hypoglycemic Agents  Renal/GU Renal disease     Musculoskeletal  (+) Arthritis , Fibromyalgia -  Abdominal   Peds  Hematology negative hematology ROS (+)   Anesthesia Other Findings Day of surgery medications reviewed with the patient.  Reproductive/Obstetrics                            Lab Results  Component Value Date   WBC 8.9 05/20/2017   HGB 11.6 (L) 05/20/2017   HCT 37.0 05/20/2017   MCV 87.5 05/20/2017   PLT 378 05/20/2017   Lab Results  Component Value Date   CREATININE 0.67 05/20/2017   BUN 9 05/20/2017   NA 136 05/20/2017   K 4.1 05/20/2017   CL 102 05/20/2017   CO2 25 05/20/2017   Lab Results  Component Value Date   INR 0.96 05/20/2017   INR 1.07 04/17/2016   INR 0.94 01/12/2015   EKG: normal sinus rhythm.  Anesthesia Physical Anesthesia Plan  ASA: III  Anesthesia Plan: General   Post-op Pain Management:    Induction: Intravenous  PONV Risk Score and Plan: 4 or greater and Ondansetron, Dexamethasone, Propofol, Midazolam and Scopolamine patch - Pre-op  Airway Management Planned: Oral ETT and Video Laryngoscope Planned  Additional Equipment:   Intra-op Plan:   Post-operative Plan: Extubation in OR  Informed Consent: I  have reviewed the patients History and Physical, chart, labs and discussed the procedure including the risks, benefits and alternatives for the proposed anesthesia with the patient or authorized representative who has indicated his/her understanding and acceptance.   Dental advisory given  Plan Discussed with: CRNA  Anesthesia Plan Comments:        Anesthesia Quick Evaluation

## 2017-05-23 NOTE — Progress Notes (Signed)
56 YO F s/p spinal surgey, on vancomycin for surgical prophylaxis d/t penicillin allergy, pharmacy to adjust dosage base one renal function. Scr 0.67 on 7/16, est. crcl ~ 80 ml/min. Received pre-op dose 1500mg  vancomycin at ~ 1000. No drain.  Plan: Vancomycin 1000mg  IV x 1 at 2200. Pharmacy sign off.  Thanks.   Maryanna Shape, PharmD, BCPS  Clinical Pharmacist  Pager: [phone]

## 2017-05-23 NOTE — OR Nursing (Signed)
Pt was assisted to the wheelchair, taken to the restroom and then taken up to 3C09.

## 2017-05-23 NOTE — Progress Notes (Signed)
Slight delay in getting patient ready for surgery.  Chart was left in patients room instead of being returned to rack.

## 2017-05-24 ENCOUNTER — Telehealth: Payer: Self-pay | Admitting: Family Medicine

## 2017-05-24 DIAGNOSIS — Z7951 Long term (current) use of inhaled steroids: Secondary | ICD-10-CM | POA: Diagnosis not present

## 2017-05-24 DIAGNOSIS — Z981 Arthrodesis status: Secondary | ICD-10-CM | POA: Diagnosis not present

## 2017-05-24 DIAGNOSIS — M5412 Radiculopathy, cervical region: Secondary | ICD-10-CM | POA: Diagnosis not present

## 2017-05-24 DIAGNOSIS — Z7982 Long term (current) use of aspirin: Secondary | ICD-10-CM | POA: Diagnosis not present

## 2017-05-24 DIAGNOSIS — G9520 Unspecified cord compression: Secondary | ICD-10-CM | POA: Diagnosis not present

## 2017-05-24 DIAGNOSIS — I10 Essential (primary) hypertension: Secondary | ICD-10-CM | POA: Diagnosis not present

## 2017-05-24 DIAGNOSIS — G4730 Sleep apnea, unspecified: Secondary | ICD-10-CM | POA: Diagnosis not present

## 2017-05-24 DIAGNOSIS — K219 Gastro-esophageal reflux disease without esophagitis: Secondary | ICD-10-CM | POA: Diagnosis not present

## 2017-05-24 DIAGNOSIS — Z794 Long term (current) use of insulin: Secondary | ICD-10-CM | POA: Diagnosis not present

## 2017-05-24 DIAGNOSIS — J449 Chronic obstructive pulmonary disease, unspecified: Secondary | ICD-10-CM | POA: Diagnosis not present

## 2017-05-24 DIAGNOSIS — Z79899 Other long term (current) drug therapy: Secondary | ICD-10-CM | POA: Diagnosis not present

## 2017-05-24 DIAGNOSIS — G9589 Other specified diseases of spinal cord: Secondary | ICD-10-CM | POA: Diagnosis not present

## 2017-05-24 DIAGNOSIS — E119 Type 2 diabetes mellitus without complications: Secondary | ICD-10-CM | POA: Diagnosis not present

## 2017-05-24 LAB — GLUCOSE, CAPILLARY: GLUCOSE-CAPILLARY: 147 mg/dL — AB (ref 65–99)

## 2017-05-24 MED ORDER — WHITE PETROLATUM GEL
Status: AC
  Filled 2017-05-24: qty 1

## 2017-05-24 NOTE — Care Management Obs Status (Signed)
[facility] NOTIFICATION   Patient Details  Name: Sandra Ferrell MRN: [id number] Date of Birth: 07-Oct-1961   Medicare Observation Status Notification Given:  Yes    Ninfa Meeker, RN 05/24/2017, 10:33 AM

## 2017-05-24 NOTE — Telephone Encounter (Signed)
[facility] Patient Name: Sandra Ferrell DOB: Mar 22, 1961 Initial Comment legs and feet swollen, meds are not working, discharged today from neck surgery Nurse Assessment Nurse: Emilio Math, RN, [place] Date/Time (Eastern Time): 05/24/2017 1:00:46 PM Confirm and document reason for call. If symptomatic, describe symptoms. ---Caller states she was discharged home today after having neck surgery. Legs and feet still swollen. Diuretics are not working. Does the patient have any new or worsening symptoms? ---Yes Will a triage be completed? ---Yes Related visit to physician within the last 2 weeks? ---Yes Does the PT have any chronic conditions? (i.e. diabetes, asthma, etc.) ---Yes List chronic conditions. ---Type 2 Diabetic, Heart murmur, Asthma, COPD, Fibromyalgia, Arthritis, Neck surgery x2, Back surgery x1 Is this a behavioral health or substance abuse call? ---No Guidelines Guideline Title Affirmed Question Affirmed Notes Leg Swelling and Edema SEVERE leg swelling (e.g., swelling extends above knee, entire leg is swollen, weeping fluid) Final Disposition User See Physician within 4 Hours (or PCP triage) Emilio Math, RN, [place] reports "sore" chest, denies chest pain. States her chest hurts from coughing. Appointment not available. Caller instructed to be seen in Urgent Care, verbalized understanding. Referrals [facility] [place] Disagree/Comply: Comply

## 2017-05-24 NOTE — Care Management Note (Signed)
Case Management Note  Patient Details  Name: Sandra Ferrell MRN: [id number] Date of Birth: [birth year]  Subjective/Objective:    57 yr old female s/p C4-5 ACDF.                Action/Plan: case manager spoke with patient and her husband concerning discharge plan. Patient is insistent that she have Home Health therapy. CM had nurse confirm with PA concerning orders. Choice was offered for Home Health, referral was called to Sandra Ferrell, [facility] Liaison. No DME needs.    Expected Discharge Date:  05/24/17               Expected Discharge Plan:  [place] [facility]  In-House Referral:  NA  Discharge planning Services  CM Consult  Post Acute Care Choice:  Home Health Choice offered to:  Patient  DME Arranged:  N/A (Has RW) DME Agency:  NA  HH Arranged:  PT, Nurse's Aide Sandra Ferrell Agency:  [facility]  Status of Service:  Completed, signed off  If discussed at [place] of Stay Meetings, dates discussed:    Additional Comments:  Ninfa Meeker, RN 05/24/2017, 10:38 AM

## 2017-05-24 NOTE — Discharge Instructions (Signed)
Anterior Cervical Diskectomy and Fusion Anterior cervical diskectomy and fusion is a surgery to remove and replace an intervertebral disk. Intervertebral disks are plates of cartilage located between the bones of the spine. This surgery is done when an intervertebral disk in the neck puts pressure on the spine or on a nerve. The surgery is done through the front (anterior) part of the neck. During the surgery, the damaged disk is removed and replaced with a plastic implant, a bone from another part of the body (bone graft), or both. Sometimes metal plates and screws (hardware) are also put in the neck to help keep the implant or bone graft in place and to allow the bones to grow together (fuse). Tell a health care provider about:  Any allergies you have.  All medicines you are taking, including vitamins, herbs, eye drops, creams, and over-the-counter medicines.  Any problems you or family members have had with anesthetic medicines.  Any blood disorders you have.  Any surgeries you have had.  Any medical conditions you have.  Whether you are pregnant or may be pregnant. What are the risks? Generally, this is a safe procedure. However, problems may occur, including:  Infection.  Bleeding with the possible need for blood transfusion.  Injury to surrounding structures, including nerves.  Leakage of fluid from the brain or spinal cord (cerebrospinal fluid).  Blood clots.  Temporary breathing difficulties.  What happens before the procedure?  Follow instructions from your health care provider about eating or drinking restrictions.  Ask your health care provider about: ? Changing or stopping your regular medicines. This is especially important if you are taking diabetes medicines or blood thinners. ? Taking medicines such as aspirin and ibuprofen. These medicines can thin your blood. Do not take these medicines before your procedure if your health care provider instructs you not  to.  You may be given antibiotic medicines to help prevent infection. What happens during the procedure?  An IV tube will be inserted into one of your veins.  You will be given one or more of the following: ? A medicine that helps you relax (sedative). ? A medicine that makes you fall asleep (general anesthetic).  A breathing tube will be placed.  Your neck will be cleaned with a germ-killing solution (antiseptic solution).  Your surgeon will make a cut (incision) in the front of your neck.  Your neck muscles will be spread apart.  The damaged disk and any damaged bone will be removed.  The area where the disk was removed will be filled with a small plastic implant, a bone graft, or both.  Hardware may be put in your neck.  The incision will be closed with stitches (sutures).  Adhesive strips or skin glue may be placed across the incision.  A bandage (dressing) will be applied over the incision. The procedure may vary among health care providers and hospitals. What happens after the procedure?  Your blood pressure, heart rate, breathing rate, and blood oxygen level will be monitored often until the medicines you were given have worn off.  You will be monitored for any signs of complications from the procedure, such as: ? Too much bleeding from the incision site. ? A buildup of blood under your skin at the surgical site. ? Difficulty breathing.  You may continue to receive antibiotics.  You can start to eat as soon as you feel comfortable.  You may be given a neck brace to wear. This brace limits your neck movement while  your bones are fusing. This information is not intended to replace advice given to you by your health care provider. Make sure you discuss any questions you have with your health care provider. Document Released: 10/10/2009 Document Revised: 03/29/2016 Document Reviewed: 10/06/2015 Elsevier Interactive Patient Education  [employer].

## 2017-05-24 NOTE — Progress Notes (Signed)
Discharge to home, husband at bedside. D/c instructions and follow up appointments discussed with patient., Verbalized understanding.

## 2017-05-24 NOTE — Op Note (Signed)
NAMEMERINA, [name]:  [id number]  MEDICAL RECORD NO.:  [id number]  PHYSICIAN:  Phylliss Bob, MD           DATE OF BIRTH:  DATE OF PROCEDURE:  05/23/2017                              OPERATIVE REPORT   PREOPERATIVE DIAGNOSES: 1. Progressive cervical myelopathy. 2. Severe spinal cord compression with myelomalacia, C4-5. 3. Status post previous cervical fusion.  POSTOPERATIVE DIAGNOSES: 1. Progressive cervical myelopathy. 2. Severe spinal cord compression with myelomalacia, C4-5. 3. Status post previous cervical fusion.  PROCEDURES: 1. Anterior cervical decompression and fusion, C4-5. 2. Placement of anterior instrumentation, C4-5. 3. Insertion of interbody device x1 (0-P intervertebral spacer). 4. Use of morselized allograft -- ViviGen. 5. Intraoperative use of Fluoroscopy. 6. Removal of anterior instrumentation.  SURGEON:  Phylliss Bob, MD.  ASSISTANTPricilla Holm, PA-C.  ANESTHESIA:  General endotracheal anesthesia.  COMPLICATIONS:  None.  DISPOSITION:  Stable.  ESTIMATED BLOOD LOSS:  Minimal.  INDICATIONS FOR SURGERY:  Briefly, Sandra Ferrell is a very pleasant 56 year old female who did present to me with progressive deterioration in her balance.  I did obtain an updated MRI of her cervical spine, which did reveal severe spinal cord compression at C4-5, with myelomalacia also identified.  Given the patient's ongoing neurologic dysfunction, we did discuss proceeding with the procedure reflected above.  The patient was fully aware of the risks and limitations of surgery and did elect to proceed with surgery.  OPERATIVE DETAILS:  On May 23, 2017, the patient was brought to surgery and general endotracheal anesthesia was administered.  The patient was placed supine on the hospital bed.  Extension of the neck was minimized given the spinal cord compression.  The patient's arms were secured to her sides and all bony prominences were  meticulously padded.  A right- sided transverse incision was then made overlying the C4-5 intervertebral space.  A Smith-Robinson approach was used and the anterior spine was noted.  The C4-5 level was noted.  A self-retaining retractor was placed.  I then was unable to place a Caspar pin into the C5 vertebral body, as there were vertebral body screws which were limiting my ability to place the Caspar pin.  I then proceeded with removing the previously placed right C5 vertebral body screw.  This was done uneventfully.  Bone wax was placed in the hole that remained.  A new Caspar pin was placed into the C4 vertebral body as well and distraction was applied.  I then proceeded with a thorough and complete C4-5 intervertebral diskectomy.  There was prominent spinal cord compression which was noted and which was removed, thereby decompressing the entire spinal canal from left to right.  The endplates were then prepared.  The appropriate size interbody spacer was packed with ViviGen and tamped into position.  The anterior plate was secured to the intervertebral device.  A 16-mm variable angle screws were placed, one in the C4 vertebral body and one in C5.  The screw was noted to be locked to the plate in the usual fashion.  I was very pleased with the final fluoroscopic images.  The wound was then irrigated thoroughly. The wound was explored for any undue bleeding and there was no bleeding encountered.  The wound was then closed in layers using 2-0  Vicryl followed by 4-0 Monocryl.  Benzoin and Steri-Strips were applied followed by sterile dressing.  All instrument counts were correct at the termination of the procedure.  Of note, Pricilla Holm, was my assistant throughout surgery, and did aid in retraction, suctioning, and closure from start to finish.     Phylliss Bob, MD   ______________________________ Phylliss Bob, MD    MD/MEDQ  D:  05/23/2017  T:  05/23/2017  Job:   [id number]

## 2017-05-25 DIAGNOSIS — I10 Essential (primary) hypertension: Secondary | ICD-10-CM | POA: Diagnosis not present

## 2017-05-25 DIAGNOSIS — Z4789 Encounter for other orthopedic aftercare: Secondary | ICD-10-CM | POA: Diagnosis not present

## 2017-05-25 DIAGNOSIS — Z794 Long term (current) use of insulin: Secondary | ICD-10-CM | POA: Diagnosis not present

## 2017-05-25 DIAGNOSIS — E785 Hyperlipidemia, unspecified: Secondary | ICD-10-CM | POA: Diagnosis not present

## 2017-05-25 DIAGNOSIS — K219 Gastro-esophageal reflux disease without esophagitis: Secondary | ICD-10-CM | POA: Diagnosis not present

## 2017-05-25 DIAGNOSIS — M5001 Cervical disc disorder with myelopathy,  high cervical region: Secondary | ICD-10-CM | POA: Diagnosis not present

## 2017-05-25 DIAGNOSIS — G4730 Sleep apnea, unspecified: Secondary | ICD-10-CM | POA: Diagnosis not present

## 2017-05-25 DIAGNOSIS — M797 Fibromyalgia: Secondary | ICD-10-CM | POA: Diagnosis not present

## 2017-05-25 DIAGNOSIS — E119 Type 2 diabetes mellitus without complications: Secondary | ICD-10-CM | POA: Diagnosis not present

## 2017-05-27 ENCOUNTER — Encounter (HOSPITAL_COMMUNITY): Payer: Self-pay | Admitting: Orthopedic Surgery

## 2017-05-27 DIAGNOSIS — K219 Gastro-esophageal reflux disease without esophagitis: Secondary | ICD-10-CM | POA: Diagnosis not present

## 2017-05-27 DIAGNOSIS — I10 Essential (primary) hypertension: Secondary | ICD-10-CM | POA: Diagnosis not present

## 2017-05-27 DIAGNOSIS — Z4789 Encounter for other orthopedic aftercare: Secondary | ICD-10-CM | POA: Diagnosis not present

## 2017-05-27 DIAGNOSIS — M797 Fibromyalgia: Secondary | ICD-10-CM | POA: Diagnosis not present

## 2017-05-27 DIAGNOSIS — E119 Type 2 diabetes mellitus without complications: Secondary | ICD-10-CM | POA: Diagnosis not present

## 2017-05-27 DIAGNOSIS — G4730 Sleep apnea, unspecified: Secondary | ICD-10-CM | POA: Diagnosis not present

## 2017-05-27 DIAGNOSIS — Z794 Long term (current) use of insulin: Secondary | ICD-10-CM | POA: Diagnosis not present

## 2017-05-27 DIAGNOSIS — E785 Hyperlipidemia, unspecified: Secondary | ICD-10-CM | POA: Diagnosis not present

## 2017-05-27 DIAGNOSIS — M5001 Cervical disc disorder with myelopathy,  high cervical region: Secondary | ICD-10-CM | POA: Diagnosis not present

## 2017-05-29 ENCOUNTER — Encounter (HOSPITAL_COMMUNITY): Payer: Self-pay | Admitting: Orthopedic Surgery

## 2017-05-31 DIAGNOSIS — G4730 Sleep apnea, unspecified: Secondary | ICD-10-CM | POA: Diagnosis not present

## 2017-05-31 DIAGNOSIS — Z4789 Encounter for other orthopedic aftercare: Secondary | ICD-10-CM | POA: Diagnosis not present

## 2017-05-31 DIAGNOSIS — Z794 Long term (current) use of insulin: Secondary | ICD-10-CM | POA: Diagnosis not present

## 2017-05-31 DIAGNOSIS — M797 Fibromyalgia: Secondary | ICD-10-CM | POA: Diagnosis not present

## 2017-05-31 DIAGNOSIS — M5001 Cervical disc disorder with myelopathy,  high cervical region: Secondary | ICD-10-CM | POA: Diagnosis not present

## 2017-05-31 DIAGNOSIS — E785 Hyperlipidemia, unspecified: Secondary | ICD-10-CM | POA: Diagnosis not present

## 2017-05-31 DIAGNOSIS — E119 Type 2 diabetes mellitus without complications: Secondary | ICD-10-CM | POA: Diagnosis not present

## 2017-05-31 DIAGNOSIS — I10 Essential (primary) hypertension: Secondary | ICD-10-CM | POA: Diagnosis not present

## 2017-05-31 DIAGNOSIS — K219 Gastro-esophageal reflux disease without esophagitis: Secondary | ICD-10-CM | POA: Diagnosis not present

## 2017-06-03 ENCOUNTER — Telehealth: Payer: Self-pay | Admitting: *Deleted

## 2017-06-03 ENCOUNTER — Other Ambulatory Visit: Payer: Self-pay | Admitting: Family Medicine

## 2017-06-03 DIAGNOSIS — G4730 Sleep apnea, unspecified: Secondary | ICD-10-CM | POA: Diagnosis not present

## 2017-06-03 DIAGNOSIS — E785 Hyperlipidemia, unspecified: Secondary | ICD-10-CM | POA: Diagnosis not present

## 2017-06-03 DIAGNOSIS — E119 Type 2 diabetes mellitus without complications: Secondary | ICD-10-CM | POA: Diagnosis not present

## 2017-06-03 DIAGNOSIS — Z4789 Encounter for other orthopedic aftercare: Secondary | ICD-10-CM | POA: Diagnosis not present

## 2017-06-03 DIAGNOSIS — Z794 Long term (current) use of insulin: Secondary | ICD-10-CM | POA: Diagnosis not present

## 2017-06-03 DIAGNOSIS — M797 Fibromyalgia: Secondary | ICD-10-CM | POA: Diagnosis not present

## 2017-06-03 DIAGNOSIS — I10 Essential (primary) hypertension: Secondary | ICD-10-CM | POA: Diagnosis not present

## 2017-06-03 DIAGNOSIS — M5001 Cervical disc disorder with myelopathy,  high cervical region: Secondary | ICD-10-CM | POA: Diagnosis not present

## 2017-06-03 DIAGNOSIS — K219 Gastro-esophageal reflux disease without esophagitis: Secondary | ICD-10-CM | POA: Diagnosis not present

## 2017-06-03 NOTE — Telephone Encounter (Signed)
Last office visit 04/16/2017.  Last refilled 05/03/2017 for #90 with no refills.  Ok to refill?

## 2017-06-03 NOTE — Telephone Encounter (Signed)
Patient left a voicemail stating that she needs a script for patches sent to the pharmacy  to help her quit smoking. Pharmacy-[facility]/[place]

## 2017-06-03 NOTE — Telephone Encounter (Signed)
Alprazolam called into [id number] - [place], [place] - [address] Phone: [phone] 

## 2017-06-04 NOTE — Telephone Encounter (Signed)
How many cigarettes id she smoking at this point?   to determine strength of patch.

## 2017-06-04 NOTE — Telephone Encounter (Signed)
Patient says she had been smoking about 1-1/2 packs per day but she had surgery last Thursday and they told her not to smoke at all for 2 weeks.  She has been smoking about 3 cigs a day (partial) just to help her nerves rather than going cold [place].  Patient says she had spoken to you before the surgery about the patches.

## 2017-06-05 NOTE — Discharge Summary (Signed)
Patient ID: Sandra Ferrell MRN: [id number] DOB/AGE: 57-Apr-1962 57 y.o.  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 Allergic rhinitis, cause unspecified   . Anxiety state, unspecified   . Backache, unspecified   . Calculus of kidney   . Carpal tunnel syndrome, right   . Cough   . Depressive disorder, not elsewhere classified    managed with medications  . Esophageal reflux   . Extrinsic asthma with exacerbation   . Fibromyalgia   . Heart murmur   . Hypertension   . Osteoarthrosis, unspecified whether generalized or localized, unspecified site   . Other and unspecified hyperlipidemia   . Other screening mammogram   . Personal history of unspecified urinary disorder   . Pneumonia   . Routine general medical examination at a health care facility   . Routine gynecological examination   . Tobacco use disorder   . Type II or unspecified type diabetes mellitus without mention of complication, not stated as uncontrolled   . Unspecified sleep apnea   . Wears glasses     Surgeries: Procedure(s): ANTERIOR CERVICAL DECOMPRESSION FUSION, CERVICAL 4-5 WITH INSTRUMENTATION AND ALLOGRAFT on 05/23/2017   Consultants: None  Discharged Condition: Improved  Hospital Course: Sandra Ferrell is an 56 y.o. female who was admitted 05/23/2017 for operative treatment of myelopathy. Patient has severe unremitting pain that affects sleep, daily activities, and work/hobbies. After pre-op clearance the patient was taken to the operating room on 05/23/2017 and underwent  Procedure(s): ANTERIOR CERVICAL DECOMPRESSION FUSION, CERVICAL 4-5 [place].    Patient was given perioperative antibiotics:  Anti-infectives    Start     Dose/Rate Route Frequency Ordered Stop   05/23/17 2200   vancomycin (VANCOCIN) IVPB 1000 mg/200 mL premix     1,000 mg 200 mL/hr over 60 Minutes Intravenous Every 12 hours 05/23/17 1603 05/23/17 2323   05/23/17 1045  vancomycin (VANCOCIN) 1,500 mg in sodium chloride 0.9 % 500 mL IVPB     1,500 mg 250 mL/hr over 120 Minutes Intravenous To [facility] 05/22/17 0828 05/23/17 1003       Patient was given sequential compression devices, early ambulation to prevent DVT.  Patient benefited maximally from hospital stay and there were no complications.    Recent vital signs: BP 135/63   Pulse 94   Temp 98.8 F (37.1 C) (Oral)   Resp 16   Ht _0  (1.499 m)   Wt 102.1 kg (225 lb)   SpO2 95%   BMI 45.44 kg/m    Discharge Medications:   Allergies as of 05/24/2017      Reactions   Benazepril Anaphylaxis, Swelling, Other (See Comments)   Angioedema, throat swelling   Diclofenac Sodium Other (See Comments)   GI bleed   Fluticasone-salmeterol Hives, Itching, Swelling   Tongue was swollen   Nsaids Other (See Comments)   Rectal bleeding   Penicillins Hives, Rash   Has patient had a PCN reaction causing immediate rash, facial/tongue/throat swelling, SOB or lightheadedness with hypotension: no Has patient had a PCN reaction causing severe rash involving mucus membranes or skin necrosis: no Has patient had a PCN reaction that required hospitalization no Has patient had a PCN reaction occurring within the last 10 years: no If all of the above answers are "NO", then may proceed with  Cephalosporin use.   Aspirin Other (See Comments)   Burns stomach   Propoxyphene Other (See Comments)   Darvocet - Headache      Medication List    STOP taking these medications   acetaminophen 500 MG tablet Commonly known as:  TYLENOL   acetaminophen-codeine 300-30 MG tablet Commonly known as:  TYLENOL #3   CORICIDIN HBP COLD/FLU PO     TAKE these medications   ACCU-CHEK AVIVA PLUS test strip Generic drug:  glucose blood CHECK BLOOD SUGAR TWICE  DAILY AND AS DIRECTED   ACCU-CHEK AVIVA PLUS w/Device Kit Check blood sugar twice daily and as directed.   ACCU-CHEK SOFTCLIX LANCETS lancets CHECK BLOOD SUGAR TWICE DAILY AND AS DIRECTED   Adjustable Lancing Device Misc Check blood sugar twice a day and as directed. Dx E11.9   AIMSCO INSULIN SYR ULTRA THIN 31G X 5/16" 0.3 ML Misc Generic drug:  Insulin Syringe-Needle U-100   Alcohol Swabs Pads Check blood sugar twice a day and as directed. Dx E11.9   amitriptyline 50 MG tablet Commonly known as:  ELAVIL TAKE 1 TABLET(50 MG) BY MOUTH AT BEDTIME What changed:  See the new instructions.   ARNICARE ARNICA Crea Apply 1 application topically as needed (Apply to ankles for pain.).   B-D ULTRAFINE III SHORT PEN 31G X 8 MM Misc Generic drug:  Insulin Pen Needle USE AS DIRECTED WITH INSULIN PEN What changed:  See the new instructions.   chlorpheniramine 4 MG tablet Commonly known as:  CHLOR-TRIMETON Take 8 mg by mouth 2 (two) times daily as needed for allergies (sinus congestion).   DIUREX PO Take 1 tablet by mouth 3 (three) times daily.   furosemide 20 MG tablet Commonly known as:  LASIX TAKE 1 TABLET(20 MG) BY MOUTH DAILY   gabapentin 300 MG capsule Commonly known as:  NEURONTIN TAKE ONE CAPSULE BY MOUTH IN THE MORNING, 1 CAPSULE IN THE AFTERNOON AND 2 CAPSULES AT BEDTIME What changed:  See the new instructions.   gemfibrozil 600 MG tablet Commonly known as:  LOPID TAKE 1 TABLET BY MOUTH TWICE DAILY   guaiFENesin 600 MG 12 hr tablet Commonly known as:  MUCINEX Take 600 mg by mouth 2 (two) times daily.   LANTUS SOLOSTAR 100 UNIT/ML Solostar Pen Generic drug:  Insulin Glargine ADMINISTER 30 UNITS UNDER THE SKIN AT BEDTIME What changed:  See the new instructions.   metFORMIN 1000 MG tablet Commonly known as:  GLUCOPHAGE TAKE 1 TABLET(1000 MG) BY MOUTH TWICE DAILY WITH A MEAL   methocarbamol 500 MG tablet Commonly known as:  ROBAXIN TAKE 2 TABLETS BY MOUTH EVERY  NIGHT AT BEDTIME What changed:  See the new instructions.   pioglitazone 45 MG tablet Commonly known as:  ACTOS TAKE 1 TABLET BY MOUTH EVERY DAY   PROAIR HFA 108 (90 Base) MCG/ACT inhaler Generic drug:  albuterol INHALE 2 PUFFS BY MOUTH EVERY 4 HOURS AS NEEDED FOR WHEEZING OR SHORTNESS OF BREATH   ranitidine 150 MG tablet Commonly known as:  ZANTAC Take 150 mg by mouth 2 (two) times daily.   sodium chloride 0.65 % Soln nasal spray Commonly known as:  OCEAN Place 2 sprays into both nostrils as needed for congestion.   SYMBICORT 80-4.5 MCG/ACT inhaler Generic drug:  budesonide-formoterol INHALE 2 PUFFS BY MOUTH TWICE DAILY   TYLENOL SINUS+HEADACHE PO Take 2 tablets by mouth 2 (two) times daily.   VICTOZA 18 MG/3ML Sopn Generic drug:  liraglutide ADMINISTER 1.8 MG UNDER THE SKIN DAILY  Diagnostic Studies: Dg Chest 2 View  Result Date: 05/20/2017 CLINICAL DATA:  Preop evaluation EXAM: CHEST  2 VIEW COMPARISON:  04/17/2016 FINDINGS: Cardiac shadow is stable. The lungs are well aerated bilaterally. No focal infiltrate or sizable effusion is seen. Degenerative changes of thoracic spine are noted. Postsurgical changes in the cervical spine are seen as well. IMPRESSION: No active cardiopulmonary disease. Electronically Signed   By: Inez Catalina M.D.   On: 05/20/2017 15:04   Dg Cervical Spine 1 View  Result Date: 05/23/2017 CLINICAL DATA:  Cervical fusion EXAM: DG CERVICAL SPINE - 1 VIEW; DG C-ARM 61-120 MIN COMPARISON:  04/17/2017 FLUOROSCOPY TIME:  Radiation Exposure Index (as provided by the fluoroscopic device): Not available If the device does not provide the exposure index: Fluoroscopy Time:  11 seconds Number of Acquired Images:  1 FINDINGS: Anterior cervical fusion at C4-5 is noted. Changes of prior fusion at C5-6 and C6-7 are noted. IMPRESSION: Cervical fusion at C4-5. Electronically Signed   By: Inez Catalina M.D.   On: 05/23/2017 13:02   Dg C-arm 1-60 Min  Result Date:  05/23/2017 CLINICAL DATA:  Cervical fusion EXAM: DG CERVICAL SPINE - 1 VIEW; DG C-ARM 61-120 MIN COMPARISON:  04/17/2017 FLUOROSCOPY TIME:  Radiation Exposure Index (as provided by the fluoroscopic device): Not available If the device does not provide the exposure index: Fluoroscopy Time:  11 seconds Number of Acquired Images:  1 FINDINGS: Anterior cervical fusion at C4-5 is noted. Changes of prior fusion at C5-6 and C6-7 are noted. IMPRESSION: Cervical fusion at C4-5. Electronically Signed   By: Inez Catalina M.D.   On: 05/23/2017 13:02    Disposition: 01-Home or Self Care  Discharge Instructions    Ambulatory referral to [place]    Complete by:  As directed    Please evaluate [name] for admission to [facility].  Disciplines requested: PT and aid  Services to provide: PT and shower health  Physician to follow patient's care (the person listed here will be responsible for signing ongoing orders): Dr Phylliss Bob  Requested Start of Care Date: 3/00/9233  I certify that this patient is under my care and that I, or a Nurse Practitioner or Physician's Assistant working with me, had a face-to-face encounter that meets the physician face-to-face requirements with patient on 05/24/2017. The encounter with the patient was in whole, or in part for the following medical condition(s) which is the primary reason for home health care (List medical condition). PT for ADL's and shower assist, gait training   Special Instructions: None   Does the patient have Medicare or Medicaid?:  No   The encounter with the patient was in whole, or in part, for the following medical condition, which is the primary reason for home health care:  PO Cerival Fusion   Reason for Medically [place]:  Therapy- Home Adaptation to Facilitate Safety   My clinical findings support the need for the above services:  Unable to leave home safely without assistance and/or assistive device   I certify  that, based on my findings, the following services are medically necessary home health services:  Physical therapy   Further, I certify that my clinical findings support that this patient is homebound due to:  Unable to leave home safely without assistance   Face-to-face encounter (required for Medicare/Medicaid patients)    Complete by:  As directed    I Pricilla Holm J certify that this patient is under my care and that  I, or a nurse practitioner or physician's assistant working with me, had a face-to-face encounter that meets the physician face-to-face encounter requirements with this patient on 05/24/2017. The encounter with the patient was in whole, or in part for the following medical condition(s) which is the primary reason for home health care (List medical condition): PO Cervical fusion   The encounter with the patient was in whole, or in part, for the following medical condition, which is the primary reason for home health care:  Cervical Fusion   I certify that, based on my findings, the following services are medically necessary home health services:  Physical therapy   Reason for Medically Necessary Home Health Services:  Therapy- Home Adaptation to Facilitate Safety   My clinical findings support the need for the above services:  Unable to leave home safely without assistance and/or assistive device   Further, I certify that my clinical findings support that this patient is homebound due to:  Unable to leave home safely without assistance   Home Health    Complete by:  As directed    To provide the following care/treatments:   [place] PT       C4-5 ACDF PO Day 1  -Improved myelopathy symptoms, minimal well controlled neck pain doing well -Written scripts for pain signed and in chart -D/C instructions sheet printed and in chart -D/C today  -F/U in office 2 weeks   Signed: Justice Britain 06/05/2017, 2:37 PM

## 2017-06-06 DIAGNOSIS — E119 Type 2 diabetes mellitus without complications: Secondary | ICD-10-CM | POA: Diagnosis not present

## 2017-06-06 DIAGNOSIS — M5001 Cervical disc disorder with myelopathy,  high cervical region: Secondary | ICD-10-CM | POA: Diagnosis not present

## 2017-06-06 DIAGNOSIS — K219 Gastro-esophageal reflux disease without esophagitis: Secondary | ICD-10-CM | POA: Diagnosis not present

## 2017-06-06 DIAGNOSIS — G4730 Sleep apnea, unspecified: Secondary | ICD-10-CM | POA: Diagnosis not present

## 2017-06-06 DIAGNOSIS — E785 Hyperlipidemia, unspecified: Secondary | ICD-10-CM | POA: Diagnosis not present

## 2017-06-06 DIAGNOSIS — Z794 Long term (current) use of insulin: Secondary | ICD-10-CM | POA: Diagnosis not present

## 2017-06-06 DIAGNOSIS — Z4789 Encounter for other orthopedic aftercare: Secondary | ICD-10-CM | POA: Diagnosis not present

## 2017-06-06 DIAGNOSIS — M797 Fibromyalgia: Secondary | ICD-10-CM | POA: Diagnosis not present

## 2017-06-06 DIAGNOSIS — I10 Essential (primary) hypertension: Secondary | ICD-10-CM | POA: Diagnosis not present

## 2017-06-06 MED ORDER — NICOTINE 14 MG/24HR TD PT24: 14.0000 mg | MEDICATED_PATCH | Freq: Every day | TRANSDERMAL | 0 refills | Status: DC

## 2017-06-06 NOTE — Addendum Note (Signed)
Addended by: Eliezer Lofts E on: 06/06/2017 12:26 PM   Modules accepted: Orders

## 2017-06-06 NOTE — Telephone Encounter (Signed)
Rx sent in

## 2017-06-07 DIAGNOSIS — G9529 Other cord compression: Secondary | ICD-10-CM | POA: Diagnosis not present

## 2017-06-07 DIAGNOSIS — Z9889 Other specified postprocedural states: Secondary | ICD-10-CM | POA: Diagnosis not present

## 2017-06-09 ENCOUNTER — Other Ambulatory Visit: Payer: Self-pay | Admitting: Family Medicine

## 2017-06-10 DIAGNOSIS — Z794 Long term (current) use of insulin: Secondary | ICD-10-CM | POA: Diagnosis not present

## 2017-06-10 DIAGNOSIS — K219 Gastro-esophageal reflux disease without esophagitis: Secondary | ICD-10-CM | POA: Diagnosis not present

## 2017-06-10 DIAGNOSIS — G4730 Sleep apnea, unspecified: Secondary | ICD-10-CM | POA: Diagnosis not present

## 2017-06-10 DIAGNOSIS — E785 Hyperlipidemia, unspecified: Secondary | ICD-10-CM | POA: Diagnosis not present

## 2017-06-10 DIAGNOSIS — M5001 Cervical disc disorder with myelopathy,  high cervical region: Secondary | ICD-10-CM | POA: Diagnosis not present

## 2017-06-10 DIAGNOSIS — E119 Type 2 diabetes mellitus without complications: Secondary | ICD-10-CM | POA: Diagnosis not present

## 2017-06-10 DIAGNOSIS — I10 Essential (primary) hypertension: Secondary | ICD-10-CM | POA: Diagnosis not present

## 2017-06-10 DIAGNOSIS — M797 Fibromyalgia: Secondary | ICD-10-CM | POA: Diagnosis not present

## 2017-06-10 DIAGNOSIS — Z4789 Encounter for other orthopedic aftercare: Secondary | ICD-10-CM | POA: Diagnosis not present

## 2017-06-10 NOTE — Telephone Encounter (Signed)
Pt called to ck status of methocarbamal; advised pt already done; pt will ck with pharmacy.

## 2017-06-10 NOTE — Telephone Encounter (Signed)
Last office visit 04/16/2017.  Last refilled 05/03/2017 for #90 with no refills.  Ok to refill?

## 2017-06-11 DIAGNOSIS — Z4789 Encounter for other orthopedic aftercare: Secondary | ICD-10-CM | POA: Diagnosis not present

## 2017-06-11 DIAGNOSIS — M797 Fibromyalgia: Secondary | ICD-10-CM | POA: Diagnosis not present

## 2017-06-11 DIAGNOSIS — I10 Essential (primary) hypertension: Secondary | ICD-10-CM | POA: Diagnosis not present

## 2017-06-11 DIAGNOSIS — K219 Gastro-esophageal reflux disease without esophagitis: Secondary | ICD-10-CM | POA: Diagnosis not present

## 2017-06-11 DIAGNOSIS — E785 Hyperlipidemia, unspecified: Secondary | ICD-10-CM | POA: Diagnosis not present

## 2017-06-11 DIAGNOSIS — M5001 Cervical disc disorder with myelopathy,  high cervical region: Secondary | ICD-10-CM | POA: Diagnosis not present

## 2017-06-11 DIAGNOSIS — E119 Type 2 diabetes mellitus without complications: Secondary | ICD-10-CM | POA: Diagnosis not present

## 2017-06-11 DIAGNOSIS — Z794 Long term (current) use of insulin: Secondary | ICD-10-CM | POA: Diagnosis not present

## 2017-06-11 DIAGNOSIS — G4730 Sleep apnea, unspecified: Secondary | ICD-10-CM | POA: Diagnosis not present

## 2017-06-12 DIAGNOSIS — Z794 Long term (current) use of insulin: Secondary | ICD-10-CM | POA: Diagnosis not present

## 2017-06-12 DIAGNOSIS — K219 Gastro-esophageal reflux disease without esophagitis: Secondary | ICD-10-CM | POA: Diagnosis not present

## 2017-06-12 DIAGNOSIS — I10 Essential (primary) hypertension: Secondary | ICD-10-CM | POA: Diagnosis not present

## 2017-06-12 DIAGNOSIS — G4730 Sleep apnea, unspecified: Secondary | ICD-10-CM | POA: Diagnosis not present

## 2017-06-12 DIAGNOSIS — M5001 Cervical disc disorder with myelopathy,  high cervical region: Secondary | ICD-10-CM | POA: Diagnosis not present

## 2017-06-12 DIAGNOSIS — M797 Fibromyalgia: Secondary | ICD-10-CM | POA: Diagnosis not present

## 2017-06-12 DIAGNOSIS — E119 Type 2 diabetes mellitus without complications: Secondary | ICD-10-CM | POA: Diagnosis not present

## 2017-06-12 DIAGNOSIS — E785 Hyperlipidemia, unspecified: Secondary | ICD-10-CM | POA: Diagnosis not present

## 2017-06-12 DIAGNOSIS — Z4789 Encounter for other orthopedic aftercare: Secondary | ICD-10-CM | POA: Diagnosis not present

## 2017-06-13 DIAGNOSIS — Z794 Long term (current) use of insulin: Secondary | ICD-10-CM | POA: Diagnosis not present

## 2017-06-13 DIAGNOSIS — M5001 Cervical disc disorder with myelopathy,  high cervical region: Secondary | ICD-10-CM | POA: Diagnosis not present

## 2017-06-13 DIAGNOSIS — E119 Type 2 diabetes mellitus without complications: Secondary | ICD-10-CM | POA: Diagnosis not present

## 2017-06-13 DIAGNOSIS — M797 Fibromyalgia: Secondary | ICD-10-CM | POA: Diagnosis not present

## 2017-06-13 DIAGNOSIS — G4730 Sleep apnea, unspecified: Secondary | ICD-10-CM | POA: Diagnosis not present

## 2017-06-13 DIAGNOSIS — K219 Gastro-esophageal reflux disease without esophagitis: Secondary | ICD-10-CM | POA: Diagnosis not present

## 2017-06-13 DIAGNOSIS — I10 Essential (primary) hypertension: Secondary | ICD-10-CM | POA: Diagnosis not present

## 2017-06-13 DIAGNOSIS — E785 Hyperlipidemia, unspecified: Secondary | ICD-10-CM | POA: Diagnosis not present

## 2017-06-13 DIAGNOSIS — Z4789 Encounter for other orthopedic aftercare: Secondary | ICD-10-CM | POA: Diagnosis not present

## 2017-06-17 DIAGNOSIS — E785 Hyperlipidemia, unspecified: Secondary | ICD-10-CM | POA: Diagnosis not present

## 2017-06-17 DIAGNOSIS — G4730 Sleep apnea, unspecified: Secondary | ICD-10-CM | POA: Diagnosis not present

## 2017-06-17 DIAGNOSIS — M5001 Cervical disc disorder with myelopathy,  high cervical region: Secondary | ICD-10-CM | POA: Diagnosis not present

## 2017-06-17 DIAGNOSIS — Z794 Long term (current) use of insulin: Secondary | ICD-10-CM | POA: Diagnosis not present

## 2017-06-17 DIAGNOSIS — K219 Gastro-esophageal reflux disease without esophagitis: Secondary | ICD-10-CM | POA: Diagnosis not present

## 2017-06-17 DIAGNOSIS — I10 Essential (primary) hypertension: Secondary | ICD-10-CM | POA: Diagnosis not present

## 2017-06-17 DIAGNOSIS — Z4789 Encounter for other orthopedic aftercare: Secondary | ICD-10-CM | POA: Diagnosis not present

## 2017-06-17 DIAGNOSIS — M797 Fibromyalgia: Secondary | ICD-10-CM | POA: Diagnosis not present

## 2017-06-17 DIAGNOSIS — E119 Type 2 diabetes mellitus without complications: Secondary | ICD-10-CM | POA: Diagnosis not present

## 2017-06-18 DIAGNOSIS — Z4789 Encounter for other orthopedic aftercare: Secondary | ICD-10-CM | POA: Diagnosis not present

## 2017-06-18 DIAGNOSIS — I10 Essential (primary) hypertension: Secondary | ICD-10-CM | POA: Diagnosis not present

## 2017-06-18 DIAGNOSIS — K219 Gastro-esophageal reflux disease without esophagitis: Secondary | ICD-10-CM | POA: Diagnosis not present

## 2017-06-18 DIAGNOSIS — E119 Type 2 diabetes mellitus without complications: Secondary | ICD-10-CM | POA: Diagnosis not present

## 2017-06-18 DIAGNOSIS — M5001 Cervical disc disorder with myelopathy,  high cervical region: Secondary | ICD-10-CM | POA: Diagnosis not present

## 2017-06-18 DIAGNOSIS — M797 Fibromyalgia: Secondary | ICD-10-CM | POA: Diagnosis not present

## 2017-06-18 DIAGNOSIS — G4730 Sleep apnea, unspecified: Secondary | ICD-10-CM | POA: Diagnosis not present

## 2017-06-18 DIAGNOSIS — Z794 Long term (current) use of insulin: Secondary | ICD-10-CM | POA: Diagnosis not present

## 2017-06-18 DIAGNOSIS — E785 Hyperlipidemia, unspecified: Secondary | ICD-10-CM | POA: Diagnosis not present

## 2017-06-19 ENCOUNTER — Telehealth: Payer: Self-pay

## 2017-06-19 DIAGNOSIS — G4730 Sleep apnea, unspecified: Secondary | ICD-10-CM | POA: Diagnosis not present

## 2017-06-19 DIAGNOSIS — E119 Type 2 diabetes mellitus without complications: Secondary | ICD-10-CM | POA: Diagnosis not present

## 2017-06-19 DIAGNOSIS — Z794 Long term (current) use of insulin: Secondary | ICD-10-CM | POA: Diagnosis not present

## 2017-06-19 DIAGNOSIS — E785 Hyperlipidemia, unspecified: Secondary | ICD-10-CM | POA: Diagnosis not present

## 2017-06-19 DIAGNOSIS — K219 Gastro-esophageal reflux disease without esophagitis: Secondary | ICD-10-CM | POA: Diagnosis not present

## 2017-06-19 DIAGNOSIS — M5001 Cervical disc disorder with myelopathy,  high cervical region: Secondary | ICD-10-CM | POA: Diagnosis not present

## 2017-06-19 DIAGNOSIS — I10 Essential (primary) hypertension: Secondary | ICD-10-CM | POA: Diagnosis not present

## 2017-06-19 DIAGNOSIS — Z4789 Encounter for other orthopedic aftercare: Secondary | ICD-10-CM | POA: Diagnosis not present

## 2017-06-19 DIAGNOSIS — M797 Fibromyalgia: Secondary | ICD-10-CM | POA: Diagnosis not present

## 2017-06-19 NOTE — Telephone Encounter (Signed)
Sandra Ferrell PT with [facility] left v/m; with concerns for respiratory status;pt complains of cold symptoms for the last few days; pt taking OTC mucinex and tylenol. Pt is using her inhaler and nasal saline due to congestion. Lungs sounded little different today; pt has prod cough with small amt of thin brown phlegm; pt does not have fever when [facility] OT and PT sees pt but last night pt had chills but not sure if fever or due to air conditioning. Pt was encouraged to call [facility] for appt or if want to order [facility] nurse order for nurse to go out and eval pt please call [place] with order.

## 2017-06-20 NOTE — Telephone Encounter (Signed)
Okay to given verbal order for nurse visit.  If pt with SOB.. Needs appt to be seen. Go to ER with severe shortness of breath.

## 2017-06-20 NOTE — Telephone Encounter (Signed)
Attempted to return [place] call, no answer left a vm to call back. Please see Dr. Rometta Emery previous message

## 2017-06-21 NOTE — Telephone Encounter (Signed)
[facility] nurse was notified per previous message. Nothing further is needed

## 2017-06-21 NOTE — Telephone Encounter (Signed)
Gerilynn returned Jasmine's call.  I let Gerilynn know Dr.Bedsole's comments.

## 2017-06-24 ENCOUNTER — Telehealth: Payer: Self-pay | Admitting: Family Medicine

## 2017-06-24 ENCOUNTER — Other Ambulatory Visit: Payer: Self-pay | Admitting: Family Medicine

## 2017-06-24 NOTE — Telephone Encounter (Signed)
Last office visit 04/16/2017.  Last refilled 04/30/2017 for #120 with 1 refill.  Ok to refill?

## 2017-06-24 NOTE — Telephone Encounter (Signed)
[facility]  Patient Name: Sandra Ferrell  DOB: 03/16/1961    Initial Comment Caller had surgery 7/19. Legs very swollen since before the surgery, on lasix and water pills. Has to sleep sitting up in chair due to neck surgery.    Nurse Assessment  Nurse: Harlow Mares, RN, Suanne Marker Date/Time (Eastern Time): 06/24/2017 10:48:26 AM  Confirm and document reason for call. If symptomatic, describe symptoms. ---Caller had surgery 7/19 (cervical surgery). Legs very swollen since before the surgery, on lasix and water pills. Has to sleep sitting up in chair due to neck surgery. Has been elevated feet on a foot stool in the evening. Reports that they are very "tight". Reports that she is having some leg pain.  Does the patient have any new or worsening symptoms? ---Yes  Will a triage be completed? ---Yes  Related visit to physician within the last 2 weeks? ---No  Does the PT have any chronic conditions? (i.e. diabetes, asthma, etc.) ---Yes  List chronic conditions. ---cervical neck issues (recent surgery); diabetes  Is this a behavioral health or substance abuse call? ---No     Guidelines    Guideline Title Affirmed Question Affirmed Notes  Leg Swelling and Edema SEVERE leg swelling (e.g., swelling extends above knee, entire leg is swollen, weeping fluid)    Final Disposition User   See Physician within 4 Hours (or PCP triage) Harlow Mares, RN, Rhonda    Comments  No available appts today at the [facility] location, caller refuses [facility] or another [place] location for appt today. Appt made with Arnette Norris for 9am 06/25/17 at [facility].   Referrals  REFERRED TO PCP OFFICE   Disagree/Comply: Disagree  Disagree/Comply Reason: Wait and see

## 2017-06-24 NOTE — Telephone Encounter (Signed)
Pt has appt 06/25/17 at 9AM with Dr Deborra Medina.

## 2017-06-25 ENCOUNTER — Ambulatory Visit: Payer: Self-pay | Admitting: Family Medicine

## 2017-06-25 DIAGNOSIS — G4730 Sleep apnea, unspecified: Secondary | ICD-10-CM | POA: Diagnosis not present

## 2017-06-25 DIAGNOSIS — M797 Fibromyalgia: Secondary | ICD-10-CM | POA: Diagnosis not present

## 2017-06-25 DIAGNOSIS — Z4789 Encounter for other orthopedic aftercare: Secondary | ICD-10-CM | POA: Diagnosis not present

## 2017-06-25 DIAGNOSIS — E119 Type 2 diabetes mellitus without complications: Secondary | ICD-10-CM | POA: Diagnosis not present

## 2017-06-25 DIAGNOSIS — E785 Hyperlipidemia, unspecified: Secondary | ICD-10-CM | POA: Diagnosis not present

## 2017-06-25 DIAGNOSIS — K219 Gastro-esophageal reflux disease without esophagitis: Secondary | ICD-10-CM | POA: Diagnosis not present

## 2017-06-25 DIAGNOSIS — M5001 Cervical disc disorder with myelopathy,  high cervical region: Secondary | ICD-10-CM | POA: Diagnosis not present

## 2017-06-25 DIAGNOSIS — I10 Essential (primary) hypertension: Secondary | ICD-10-CM | POA: Diagnosis not present

## 2017-06-25 DIAGNOSIS — Z794 Long term (current) use of insulin: Secondary | ICD-10-CM | POA: Diagnosis not present

## 2017-06-26 DIAGNOSIS — E785 Hyperlipidemia, unspecified: Secondary | ICD-10-CM | POA: Diagnosis not present

## 2017-06-26 DIAGNOSIS — Z794 Long term (current) use of insulin: Secondary | ICD-10-CM | POA: Diagnosis not present

## 2017-06-26 DIAGNOSIS — Z4789 Encounter for other orthopedic aftercare: Secondary | ICD-10-CM | POA: Diagnosis not present

## 2017-06-26 DIAGNOSIS — I10 Essential (primary) hypertension: Secondary | ICD-10-CM | POA: Diagnosis not present

## 2017-06-26 DIAGNOSIS — E119 Type 2 diabetes mellitus without complications: Secondary | ICD-10-CM | POA: Diagnosis not present

## 2017-06-26 DIAGNOSIS — K219 Gastro-esophageal reflux disease without esophagitis: Secondary | ICD-10-CM | POA: Diagnosis not present

## 2017-06-26 DIAGNOSIS — M797 Fibromyalgia: Secondary | ICD-10-CM | POA: Diagnosis not present

## 2017-06-26 DIAGNOSIS — M5001 Cervical disc disorder with myelopathy,  high cervical region: Secondary | ICD-10-CM | POA: Diagnosis not present

## 2017-06-26 DIAGNOSIS — G4730 Sleep apnea, unspecified: Secondary | ICD-10-CM | POA: Diagnosis not present

## 2017-06-27 ENCOUNTER — Telehealth: Payer: Self-pay | Admitting: Family Medicine

## 2017-06-27 DIAGNOSIS — K219 Gastro-esophageal reflux disease without esophagitis: Secondary | ICD-10-CM | POA: Diagnosis not present

## 2017-06-27 DIAGNOSIS — E119 Type 2 diabetes mellitus without complications: Secondary | ICD-10-CM | POA: Diagnosis not present

## 2017-06-27 DIAGNOSIS — Z4789 Encounter for other orthopedic aftercare: Secondary | ICD-10-CM | POA: Diagnosis not present

## 2017-06-27 DIAGNOSIS — M797 Fibromyalgia: Secondary | ICD-10-CM | POA: Diagnosis not present

## 2017-06-27 DIAGNOSIS — I10 Essential (primary) hypertension: Secondary | ICD-10-CM | POA: Diagnosis not present

## 2017-06-27 DIAGNOSIS — Z794 Long term (current) use of insulin: Secondary | ICD-10-CM | POA: Diagnosis not present

## 2017-06-27 DIAGNOSIS — M5001 Cervical disc disorder with myelopathy,  high cervical region: Secondary | ICD-10-CM | POA: Diagnosis not present

## 2017-06-27 DIAGNOSIS — E785 Hyperlipidemia, unspecified: Secondary | ICD-10-CM | POA: Diagnosis not present

## 2017-06-27 DIAGNOSIS — G4730 Sleep apnea, unspecified: Secondary | ICD-10-CM | POA: Diagnosis not present

## 2017-06-27 NOTE — Telephone Encounter (Signed)
Spoke with Sandra Ferrell. Appointment scheduled 06/28/2017 at 4:00 pm with Dr. [name].

## 2017-06-27 NOTE — Telephone Encounter (Signed)
Pt home health aid called. PT has lt brown sputum, positive for course lung sounds. She has been taking mucinex and day &nyquil for 6days. No fever or chills. SOB is about the same as usual. Please advise. Call [place] with [facility]  [phone].

## 2017-06-27 NOTE — Telephone Encounter (Signed)
Ideally pt needs appt to be seen. Can she get to the office?

## 2017-06-28 ENCOUNTER — Encounter: Payer: Self-pay | Admitting: Family Medicine

## 2017-06-28 ENCOUNTER — Ambulatory Visit (INDEPENDENT_AMBULATORY_CARE_PROVIDER_SITE_OTHER): Payer: Medicare Other | Admitting: Family Medicine

## 2017-06-28 DIAGNOSIS — J441 Chronic obstructive pulmonary disease with (acute) exacerbation: Secondary | ICD-10-CM

## 2017-06-28 MED ORDER — AZITHROMYCIN 250 MG PO TABS: ORAL_TABLET | ORAL | 0 refills | Status: DC

## 2017-06-28 MED ORDER — ALPRAZOLAM 0.5 MG PO TABS: 0.5000 mg | ORAL_TABLET | Freq: Three times a day (TID) | ORAL | 0 refills | Status: DC | PRN

## 2017-06-28 NOTE — Assessment & Plan Note (Signed)
Treat with antibitoics given change in sputum. Given per pt she is breathing at baseline.. Will try to hold off on prednsione as it increases her appetite and  Glucose levels.  She will call if not improving as expected.  Continue albuterol prn as well as steroid controller.

## 2017-06-28 NOTE — Progress Notes (Signed)
   Subjective:    Patient ID: Sandra Ferrell, female    DOB: May 09, 1961, 56 y.o.   MRN: [id number]  HPI    56 year old female smoker with history of COPD,  DM recent back surgery last month in hospital presents with new onset coarse cough and brown sputum change in last  week. No SOB, no wheeze. No fever  She is using mucinex.  Using severe cold and flu.  No ear  or face pain.  When home health RN listening heard rattle.   No recent antibiotics.   She is using proair  and symbicort twice daily.    Blood pressure (!) 114/53, pulse 93, temperature 98.6 F (37 C), temperature source Oral, height 4\' 10"  (1.473 m), weight 223 lb 12 oz (101.5 kg), SpO2 95 %.  Review of Systems  Constitutional: Negative for fatigue and fever.  HENT: Negative for ear pain.   Eyes: Negative for pain.  Respiratory: Negative for chest tightness and shortness of breath.   Cardiovascular: Negative for chest pain, palpitations and leg swelling.  Gastrointestinal: Negative for abdominal pain.  Genitourinary: Negative for dysuria.       Objective:   Physical Exam  Constitutional: Vital signs are normal. She appears well-developed and well-nourished. She is cooperative.  Non-toxic appearance. She does not appear ill. No distress.  HENT:  Head: Normocephalic.  Right Ear: Hearing, tympanic membrane, external ear and ear canal normal. Tympanic membrane is not erythematous, not retracted and not bulging.  Left Ear: Hearing, tympanic membrane, external ear and ear canal normal. Tympanic membrane is not erythematous, not retracted and not bulging.  Nose: No mucosal edema or rhinorrhea. Right sinus exhibits no maxillary sinus tenderness and no frontal sinus tenderness. Left sinus exhibits no maxillary sinus tenderness and no frontal sinus tenderness.  Mouth/Throat: Uvula is midline, oropharynx is clear and moist and mucous membranes are normal.  Eyes: Pupils are equal, round, and reactive to light. Conjunctivae,  EOM and lids are normal. Lids are everted and swept, no foreign bodies found.  Neck: Trachea normal and normal range of motion. Neck supple. Carotid bruit is not present. No thyroid mass and no thyromegaly present.  Cardiovascular: Normal rate, regular rhythm, S1 normal, S2 normal, normal heart sounds, intact distal pulses and normal pulses.  Exam reveals no gallop and no friction rub.   No murmur heard. Pulmonary/Chest: Effort normal. No tachypnea. No respiratory distress. She has no decreased breath sounds. She has wheezes. She has no rhonchi. She has no rales.   diffuse wheeze and decreased air movement throughout  Abdominal: Soft. Normal appearance and bowel sounds are normal. There is no tenderness.  Neurological: She is alert.  Skin: Skin is warm, dry and intact. No rash noted.  Psychiatric: Her speech is normal and behavior is normal. Judgment and thought content normal. Her mood appears not anxious. Cognition and memory are normal. She does not exhibit a depressed mood.          Assessment & Plan:

## 2017-06-28 NOTE — Patient Instructions (Signed)
Complete antibiotics. Keep using mucinex, continue incentive spirometry. Call if shortness of breath or wheeze progresses for possible steroid course. Go to ER  For sever shortness of breath.

## 2017-07-02 DIAGNOSIS — Z9889 Other specified postprocedural states: Secondary | ICD-10-CM | POA: Diagnosis not present

## 2017-07-05 DIAGNOSIS — M5001 Cervical disc disorder with myelopathy,  high cervical region: Secondary | ICD-10-CM | POA: Diagnosis not present

## 2017-07-05 DIAGNOSIS — E785 Hyperlipidemia, unspecified: Secondary | ICD-10-CM | POA: Diagnosis not present

## 2017-07-05 DIAGNOSIS — M797 Fibromyalgia: Secondary | ICD-10-CM | POA: Diagnosis not present

## 2017-07-05 DIAGNOSIS — K219 Gastro-esophageal reflux disease without esophagitis: Secondary | ICD-10-CM | POA: Diagnosis not present

## 2017-07-05 DIAGNOSIS — E119 Type 2 diabetes mellitus without complications: Secondary | ICD-10-CM | POA: Diagnosis not present

## 2017-07-05 DIAGNOSIS — Z794 Long term (current) use of insulin: Secondary | ICD-10-CM | POA: Diagnosis not present

## 2017-07-05 DIAGNOSIS — G4730 Sleep apnea, unspecified: Secondary | ICD-10-CM | POA: Diagnosis not present

## 2017-07-05 DIAGNOSIS — I10 Essential (primary) hypertension: Secondary | ICD-10-CM | POA: Diagnosis not present

## 2017-07-05 DIAGNOSIS — Z4789 Encounter for other orthopedic aftercare: Secondary | ICD-10-CM | POA: Diagnosis not present

## 2017-07-06 ENCOUNTER — Other Ambulatory Visit: Payer: Self-pay | Admitting: Family Medicine

## 2017-07-06 NOTE — Telephone Encounter (Signed)
Last office visit 06/28/17.  Last refilled 06/10/17 for #90 with no refills.  Ok to refill?

## 2017-07-09 DIAGNOSIS — G5621 Lesion of ulnar nerve, right upper limb: Secondary | ICD-10-CM | POA: Diagnosis not present

## 2017-07-09 DIAGNOSIS — G5602 Carpal tunnel syndrome, left upper limb: Secondary | ICD-10-CM | POA: Diagnosis not present

## 2017-07-09 DIAGNOSIS — G5601 Carpal tunnel syndrome, right upper limb: Secondary | ICD-10-CM | POA: Diagnosis not present

## 2017-07-09 NOTE — Telephone Encounter (Signed)
[place] request status of methocarbamol refill to [facility] [address]; advised pt already done and pt voiced understanding.

## 2017-07-10 ENCOUNTER — Telehealth: Payer: Self-pay

## 2017-07-10 DIAGNOSIS — Z794 Long term (current) use of insulin: Secondary | ICD-10-CM | POA: Diagnosis not present

## 2017-07-10 DIAGNOSIS — M5001 Cervical disc disorder with myelopathy,  high cervical region: Secondary | ICD-10-CM | POA: Diagnosis not present

## 2017-07-10 DIAGNOSIS — K219 Gastro-esophageal reflux disease without esophagitis: Secondary | ICD-10-CM | POA: Diagnosis not present

## 2017-07-10 DIAGNOSIS — E119 Type 2 diabetes mellitus without complications: Secondary | ICD-10-CM | POA: Diagnosis not present

## 2017-07-10 DIAGNOSIS — E785 Hyperlipidemia, unspecified: Secondary | ICD-10-CM | POA: Diagnosis not present

## 2017-07-10 DIAGNOSIS — G4730 Sleep apnea, unspecified: Secondary | ICD-10-CM | POA: Diagnosis not present

## 2017-07-10 DIAGNOSIS — I10 Essential (primary) hypertension: Secondary | ICD-10-CM | POA: Diagnosis not present

## 2017-07-10 DIAGNOSIS — Z4789 Encounter for other orthopedic aftercare: Secondary | ICD-10-CM | POA: Diagnosis not present

## 2017-07-10 DIAGNOSIS — M797 Fibromyalgia: Secondary | ICD-10-CM | POA: Diagnosis not present

## 2017-07-10 NOTE — Telephone Encounter (Signed)
She has asthma and is a smoker. Please talk with pt and get update on how she feels.  If improving and no SOB.. Give more time for recovery. If still feeling bad have her make an appt to be seen.

## 2017-07-10 NOTE — Telephone Encounter (Signed)
Spoke with Chrys Racer.  She states other than a slight cough and coughing up a little mucus, she is okay.  She does not feel that she needs to be seen at this time but will call back if things get any worse.

## 2017-07-10 NOTE — Telephone Encounter (Signed)
Diane OT with [facility] left v/m; Diane saw pt today and pt seen [facility] 06/28/17 and pt has finished abx; Diane said pt still has abnormal sound in pts lungs. Please advise.

## 2017-07-11 DIAGNOSIS — Z4789 Encounter for other orthopedic aftercare: Secondary | ICD-10-CM | POA: Diagnosis not present

## 2017-07-11 DIAGNOSIS — E119 Type 2 diabetes mellitus without complications: Secondary | ICD-10-CM | POA: Diagnosis not present

## 2017-07-11 DIAGNOSIS — Z794 Long term (current) use of insulin: Secondary | ICD-10-CM | POA: Diagnosis not present

## 2017-07-11 DIAGNOSIS — I10 Essential (primary) hypertension: Secondary | ICD-10-CM | POA: Diagnosis not present

## 2017-07-11 DIAGNOSIS — M797 Fibromyalgia: Secondary | ICD-10-CM | POA: Diagnosis not present

## 2017-07-11 DIAGNOSIS — G4730 Sleep apnea, unspecified: Secondary | ICD-10-CM | POA: Diagnosis not present

## 2017-07-11 DIAGNOSIS — E785 Hyperlipidemia, unspecified: Secondary | ICD-10-CM | POA: Diagnosis not present

## 2017-07-11 DIAGNOSIS — K219 Gastro-esophageal reflux disease without esophagitis: Secondary | ICD-10-CM | POA: Diagnosis not present

## 2017-07-11 DIAGNOSIS — M5001 Cervical disc disorder with myelopathy,  high cervical region: Secondary | ICD-10-CM | POA: Diagnosis not present

## 2017-07-11 NOTE — Telephone Encounter (Signed)
Agreed -

## 2017-07-17 DIAGNOSIS — M797 Fibromyalgia: Secondary | ICD-10-CM | POA: Diagnosis not present

## 2017-07-17 DIAGNOSIS — G4730 Sleep apnea, unspecified: Secondary | ICD-10-CM | POA: Diagnosis not present

## 2017-07-17 DIAGNOSIS — E785 Hyperlipidemia, unspecified: Secondary | ICD-10-CM | POA: Diagnosis not present

## 2017-07-17 DIAGNOSIS — M5001 Cervical disc disorder with myelopathy,  high cervical region: Secondary | ICD-10-CM | POA: Diagnosis not present

## 2017-07-17 DIAGNOSIS — I10 Essential (primary) hypertension: Secondary | ICD-10-CM | POA: Diagnosis not present

## 2017-07-17 DIAGNOSIS — Z4789 Encounter for other orthopedic aftercare: Secondary | ICD-10-CM | POA: Diagnosis not present

## 2017-07-17 DIAGNOSIS — K219 Gastro-esophageal reflux disease without esophagitis: Secondary | ICD-10-CM | POA: Diagnosis not present

## 2017-07-17 DIAGNOSIS — E119 Type 2 diabetes mellitus without complications: Secondary | ICD-10-CM | POA: Diagnosis not present

## 2017-07-17 DIAGNOSIS — Z794 Long term (current) use of insulin: Secondary | ICD-10-CM | POA: Diagnosis not present

## 2017-07-18 ENCOUNTER — Telehealth: Payer: Self-pay

## 2017-07-18 ENCOUNTER — Other Ambulatory Visit (INDEPENDENT_AMBULATORY_CARE_PROVIDER_SITE_OTHER): Payer: Medicare Other

## 2017-07-18 ENCOUNTER — Telehealth: Payer: Self-pay | Admitting: Family Medicine

## 2017-07-18 DIAGNOSIS — M5001 Cervical disc disorder with myelopathy,  high cervical region: Secondary | ICD-10-CM | POA: Diagnosis not present

## 2017-07-18 DIAGNOSIS — K219 Gastro-esophageal reflux disease without esophagitis: Secondary | ICD-10-CM | POA: Diagnosis not present

## 2017-07-18 DIAGNOSIS — I10 Essential (primary) hypertension: Secondary | ICD-10-CM

## 2017-07-18 DIAGNOSIS — Z794 Long term (current) use of insulin: Secondary | ICD-10-CM | POA: Diagnosis not present

## 2017-07-18 DIAGNOSIS — E119 Type 2 diabetes mellitus without complications: Secondary | ICD-10-CM | POA: Diagnosis not present

## 2017-07-18 DIAGNOSIS — M797 Fibromyalgia: Secondary | ICD-10-CM | POA: Diagnosis not present

## 2017-07-18 DIAGNOSIS — E785 Hyperlipidemia, unspecified: Secondary | ICD-10-CM

## 2017-07-18 DIAGNOSIS — G4730 Sleep apnea, unspecified: Secondary | ICD-10-CM | POA: Diagnosis not present

## 2017-07-18 DIAGNOSIS — Z4789 Encounter for other orthopedic aftercare: Secondary | ICD-10-CM | POA: Diagnosis not present

## 2017-07-18 NOTE — Telephone Encounter (Signed)
-----   Message from Inocencio Homes, [place] sent at 07/18/2017  8:55 AM EDT ----- Regarding: 3 MONTH LABS  Please order labs for patient. Thank you.

## 2017-07-18 NOTE — Telephone Encounter (Signed)
Pharmacist at [facility] left v/m about status of 2 previous faxes for drug therapy recommendations on 06/28/17 and 07/12/17 about possible starting pt on statin due to pt having diabetes. Please advise. I called [facility] and advised faxed back on 07/09/17. Recommendation refused because pt refused. [facility] will make notation.

## 2017-07-19 LAB — COMPREHENSIVE METABOLIC PANEL
ALBUMIN: 3.7 g/dL (ref 3.5–5.2)
ALT: 13 U/L (ref 0–35)
AST: 17 U/L (ref 0–37)
Alkaline Phosphatase: 76 U/L (ref 39–117)
BUN: 12 mg/dL (ref 6–23)
CALCIUM: 9.2 mg/dL (ref 8.4–10.5)
CO2: 28 mEq/L (ref 19–32)
CREATININE: 0.68 mg/dL (ref 0.40–1.20)
Chloride: 105 mEq/L (ref 96–112)
GFR: 94.73 mL/min (ref >60.00)
Glucose, Bld: 164 mg/dL — ABNORMAL HIGH (ref 70–99)
Potassium: 4.5 mEq/L (ref 3.5–5.1)
SODIUM: 138 meq/L (ref 135–145)
TOTAL PROTEIN: 6.5 g/dL (ref 6.0–8.3)
Total Bilirubin: 0.3 mg/dL (ref 0.2–1.2)

## 2017-07-19 LAB — LIPID PANEL
CHOLESTEROL: 147 mg/dL (ref 0–200)
HDL: 61.8 mg/dL (ref >39.00)
LDL Cholesterol: 64 mg/dL (ref 0–99)
NonHDL: 85.61
TRIGLYCERIDES: 106 mg/dL (ref 0.0–149.0)
Total CHOL/HDL Ratio: 2
VLDL: 21.2 mg/dL (ref 0.0–40.0)

## 2017-07-22 ENCOUNTER — Other Ambulatory Visit: Payer: Self-pay | Admitting: Family Medicine

## 2017-07-23 ENCOUNTER — Ambulatory Visit (INDEPENDENT_AMBULATORY_CARE_PROVIDER_SITE_OTHER): Payer: Medicare Other | Admitting: Family Medicine

## 2017-07-23 ENCOUNTER — Encounter: Payer: Self-pay | Admitting: Family Medicine

## 2017-07-23 VITALS — BP 120/70 | HR 88 | Temp 97.5°F | Ht <= 58 in | Wt 212.8 lb

## 2017-07-23 DIAGNOSIS — F3341 Major depressive disorder, recurrent, in partial remission: Secondary | ICD-10-CM

## 2017-07-23 DIAGNOSIS — G959 Disease of spinal cord, unspecified: Secondary | ICD-10-CM | POA: Diagnosis not present

## 2017-07-23 DIAGNOSIS — M797 Fibromyalgia: Secondary | ICD-10-CM

## 2017-07-23 DIAGNOSIS — M4712 Other spondylosis with myelopathy, cervical region: Secondary | ICD-10-CM

## 2017-07-23 DIAGNOSIS — E119 Type 2 diabetes mellitus without complications: Secondary | ICD-10-CM

## 2017-07-23 DIAGNOSIS — M5412 Radiculopathy, cervical region: Secondary | ICD-10-CM

## 2017-07-23 MED ORDER — GABAPENTIN 300 MG PO CAPS: ORAL_CAPSULE | ORAL | 0 refills | Status: DC

## 2017-07-23 NOTE — Progress Notes (Signed)
Subjective:    Patient ID: Sandra Ferrell, female    DOB: 02-25-1961, 56 y.o.   MRN: [id number]  HPI   56 year old female presents for follow up DM, but more significantly she is having severe neck pain.. She reports this is negatively effecting her mood. She is having thoughts about hurting herself.  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  Dr. Lynann Bologna. Still having pain in neck.. Tingling and stinging in bilateral hands. Has upcoming  appt with ORTHO for carpal tunnel surgery on Sept 29th.  Taking Tyelnol 3..helps some but not enough.  On gabapentin 300/300/600. She was doing home health PT.  Now discharged.Reubin Milan pay for outpt PT.    She has tried icy hot ( caused rash) Has changed to Sneads with lidocaine.   Stiffness in legs and low back pain.  Using cane.  She is no longer on amitriptyline at bedtime. Taking methocarbamol at bedtime as well.  PHQ9 24 She is frustrated with the pain and cannot do things. She has had surgery yearly for past 3 years.  Wt Readings from Last 3 Encounters:  07/23/17 212 lb 12 oz (96.5 kg)  06/28/17 223 lb 12 oz (101.5 kg)  05/23/17 225 lb (102.1 kg)    Lab Results  Component Value Date   HGBA1C 7.3 (H) 05/20/2017  Diabetes:  Moderate control on current regimen.  Using medications without difficulties: Hypoglycemic episodes: Hyperglycemic episodes: Feet problems: Blood Sugars averaging: eye exam within last year:     Review of Systems  Constitutional: Negative for fatigue and fever.  HENT: Negative for ear pain.   Eyes: Negative for pain.  Respiratory: Negative for chest tightness and shortness of breath.   Cardiovascular: Negative for chest pain, palpitations and leg swelling.  Gastrointestinal: Negative for abdominal pain.  Genitourinary: Negative for dysuria.       Objective:   Physical Exam  Constitutional: Vital signs are normal. She appears well-developed and well-nourished.  She is cooperative.  Non-toxic appearance. She does not appear ill. No distress.  Morbid obesity  HENT:  Head: Normocephalic.  Right Ear: Hearing, tympanic membrane, external ear and ear canal normal. Tympanic membrane is not erythematous, not retracted and not bulging.  Left Ear: Hearing, tympanic membrane, external ear and ear canal normal. Tympanic membrane is not erythematous, not retracted and not bulging.  Nose: No mucosal edema or rhinorrhea. Right sinus exhibits no maxillary sinus tenderness and no frontal sinus tenderness. Left sinus exhibits no maxillary sinus tenderness and no frontal sinus tenderness.  Mouth/Throat: Uvula is midline, oropharynx is clear and moist and mucous membranes are normal.  Eyes: Pupils are equal, round, and reactive to light. Conjunctivae, EOM and lids are normal. Lids are everted and swept, no foreign bodies found.  Neck: Trachea normal and normal range of motion. Neck supple. Carotid bruit is not present. No thyroid mass and no thyromegaly present.  Cardiovascular: Normal rate, regular rhythm, S1 normal, S2 normal, normal heart sounds, intact distal pulses and normal pulses.  Exam reveals no gallop and no friction rub.   No murmur heard. Pulmonary/Chest: Effort normal and breath sounds normal. No tachypnea. No respiratory distress. She has no decreased breath sounds. She has no wheezes. She has no rhonchi. She has no rales.  Abdominal: Soft. Normal appearance and bowel sounds are normal. There is no tenderness.  Musculoskeletal:       Cervical back: She exhibits decreased range of motion, tenderness and bony tenderness.  Lumbar back: She exhibits decreased range of motion and tenderness.  Neurological: She is alert.  Skin: Skin is warm, dry and intact. No rash noted.  Psychiatric: Her speech is normal and behavior is normal. Judgment and thought content normal. Her mood appears not anxious. Cognition and memory are normal. She does not exhibit a depressed  mood.   Diabetic foot exam: Normal inspection No skin breakdown No calluses  Normal DP pulses Normal sensation to light touch and monofilament Nails normal        Assessment & Plan:

## 2017-07-23 NOTE — Patient Instructions (Addendum)
Hold methocarbamol for now.  Start back amitryptiline at bedtime.. See how it goes with mood and pain.  Keep up with home PT and move more as able.

## 2017-07-24 ENCOUNTER — Other Ambulatory Visit: Payer: Self-pay | Admitting: Family Medicine

## 2017-07-24 LAB — HM DIABETES FOOT EXAM

## 2017-07-24 NOTE — Telephone Encounter (Signed)
Alprazolam called into [facility] [place], [place] Phone: [phone]

## 2017-07-24 NOTE — Telephone Encounter (Signed)
Last office visit 07/23/2017.  Last refilled 06/28/2017 for #90 with no refills.  Ok to refill?

## 2017-07-25 MED ORDER — ALPRAZOLAM 0.5 MG PO TABS: 0.5000 mg | ORAL_TABLET | Freq: Three times a day (TID) | ORAL | 0 refills | Status: DC | PRN

## 2017-07-25 NOTE — Telephone Encounter (Signed)
Pt left v/m that [facility] [address] did not have alprazolam refill. I spoke with Tye Maryland at [facility] and they did not get refill on 07/24/17; Medication phoned to cathy at [facility] pharmacy as instructed.pt voiced understanding.

## 2017-07-25 NOTE — Addendum Note (Signed)
Addended by: Helene Shoe on: 07/25/2017 01:37 PM   Modules accepted: Orders

## 2017-08-03 ENCOUNTER — Other Ambulatory Visit: Payer: Self-pay | Admitting: Family Medicine

## 2017-08-04 NOTE — Assessment & Plan Note (Signed)
S/P surgery with inadequate control of pain. Pt to re-start home PT, discussed benefits at [place]. Use tyelnol #3 for pain and gabapentin.

## 2017-08-04 NOTE — Assessment & Plan Note (Signed)
Likely contributing to her back issues. Restart amitriptyline to help with body  pain.

## 2017-08-04 NOTE — Assessment & Plan Note (Signed)
Poor control due to repeated surgery in last year and poorly controlled pain. Continue current regimen but add back amitriptyline. Close follow up.  Pt denies SI.

## 2017-08-04 NOTE — Assessment & Plan Note (Signed)
Inadequate control, but pt  Unable to focus on healthy lifestyle given pain level. Continue current meds. Follow up for re-discussion in near future.

## 2017-08-05 NOTE — Telephone Encounter (Signed)
Last office visit 07/23/2017.  Last refilled 07/09/2017 for #90 with no refills.  Ok to refill?

## 2017-08-06 ENCOUNTER — Ambulatory Visit: Payer: Medicare Other | Admitting: Family Medicine

## 2017-08-07 ENCOUNTER — Other Ambulatory Visit: Payer: Self-pay | Admitting: Family Medicine

## 2017-08-08 ENCOUNTER — Encounter: Payer: Self-pay | Admitting: Family Medicine

## 2017-08-08 ENCOUNTER — Ambulatory Visit (INDEPENDENT_AMBULATORY_CARE_PROVIDER_SITE_OTHER): Payer: Medicare Other | Admitting: Family Medicine

## 2017-08-08 DIAGNOSIS — J441 Chronic obstructive pulmonary disease with (acute) exacerbation: Secondary | ICD-10-CM | POA: Diagnosis not present

## 2017-08-08 MED ORDER — PREDNISONE 20 MG PO TABS: ORAL_TABLET | ORAL | 0 refills | Status: DC

## 2017-08-08 MED ORDER — IPRATROPIUM BROMIDE 0.02 % IN SOLN
0.5000 mg | Freq: Once | RESPIRATORY_TRACT | Status: AC
  Administered 2017-08-08: 0.5 mg via RESPIRATORY_TRACT

## 2017-08-08 MED ORDER — AMITRIPTYLINE HCL 50 MG PO TABS: ORAL_TABLET | ORAL | 3 refills | Status: DC

## 2017-08-08 MED ORDER — ALBUTEROL SULFATE (2.5 MG/3ML) 0.083% IN NEBU
2.5000 mg | INHALATION_SOLUTION | Freq: Once | RESPIRATORY_TRACT | Status: AC
  Administered 2017-08-08: 2.5 mg via RESPIRATORY_TRACT

## 2017-08-08 MED ORDER — DOXYCYCLINE HYCLATE 100 MG PO TABS: 100.0000 mg | ORAL_TABLET | Freq: Two times a day (BID) | ORAL | 0 refills | Status: DC

## 2017-08-08 NOTE — Assessment & Plan Note (Signed)
Follow sugar closely on prednisone.

## 2017-08-08 NOTE — Patient Instructions (Addendum)
Continue albuterol every  6 hours as needed for wheeze. Continue mucinex DM twice daily, symbicort daily. Complete antibiotics ( Doxy x 10 days) and prednisone taper. Follow sugar closely on prednisone.

## 2017-08-08 NOTE — Progress Notes (Signed)
   Subjective:    Patient ID: Sandra Ferrell, female    DOB: 1961/06/18, 56 y.o.   MRN: [id number]  Cough  This is a new problem. The current episode started in the past 7 days. The cough is productive of purulent sputum and productive of brown sputum. Associated symptoms include nasal congestion, shortness of breath and wheezing. Pertinent negatives include no ear congestion, ear pain, fever, myalgias or sore throat. The symptoms are aggravated by lying down (cough not keeping her up at night). Risk factors for lung disease include smoking/tobacco exposure. Treatments tried: mucinex DM, using albuterol three times daily, symbicort. The treatment provided moderate relief. Her past medical history is significant for COPD and environmental allergies. There is no history of asthma.   She has put on some fluid in ankles.. had not been elevating legs as much.. Eating out more, not walking as much as previous. Wt Readings from Last 3 Encounters:  08/08/17 224 lb 12 oz (101.9 kg)  07/23/17 212 lb 12 oz (96.5 kg)  06/28/17 223 lb 12 oz (101.5 kg)  Antibitoics azithro x 5 days in 06/2017 for COPD exac.  Starting back on amitriptyline has helped her mood.. Not helped neck pain much.  Sleeping better.   Blood pressure 100/60, pulse 87, temperature 98.6 F (37 C), temperature source Oral, height 4\' 10"  (1.473 m), weight 224 lb 12 oz (101.9 kg), SpO2 93 %.  Review of Systems  Constitutional: Negative for fever.  HENT: Negative for ear pain and sore throat.   Respiratory: Positive for cough, shortness of breath and wheezing.   Musculoskeletal: Negative for myalgias.  Allergic/Immunologic: Positive for environmental allergies.       Objective:   Physical Exam  Constitutional: Vital signs are normal. She appears well-developed and well-nourished. She is cooperative.  Non-toxic appearance. She does not appear ill. No distress.  HENT:  Head: Normocephalic.  Right Ear: Hearing, tympanic membrane,  external ear and ear canal normal. Tympanic membrane is not erythematous, not retracted and not bulging.  Left Ear: Hearing, tympanic membrane, external ear and ear canal normal. Tympanic membrane is not erythematous, not retracted and not bulging.  Nose: Mucosal edema and rhinorrhea present. Right sinus exhibits no maxillary sinus tenderness and no frontal sinus tenderness. Left sinus exhibits no maxillary sinus tenderness and no frontal sinus tenderness.  Mouth/Throat: Uvula is midline, oropharynx is clear and moist and mucous membranes are normal.  Eyes: Pupils are equal, round, and reactive to light. Conjunctivae, EOM and lids are normal. Lids are everted and swept, no foreign bodies found.  Neck: Trachea normal and normal range of motion. Neck supple. Carotid bruit is not present. No thyroid mass and no thyromegaly present.  Cardiovascular: Normal rate, regular rhythm, S1 normal, S2 normal, normal heart sounds, intact distal pulses and normal pulses.  Exam reveals no gallop and no friction rub.   No murmur heard. Pulmonary/Chest: Effort normal. No tachypnea. No respiratory distress. She has wheezes. She has rhonchi. She has no rales.  Wheezes and rales thru lungs bialterally.  Neurological: She is alert.  Skin: Skin is warm, dry and intact. No rash noted.  Psychiatric: Her speech is normal and behavior is normal. Judgment normal. Her mood appears not anxious. Cognition and memory are normal. She does not exhibit a depressed mood.          Assessment & Plan:

## 2017-08-08 NOTE — Assessment & Plan Note (Signed)
Recurrent.. Use doxy x 10 days given sputum change.  Air movement improved with atrovent and albuterol in office today.  Treat with albuterol and pred taper.  Follow up for resolution.

## 2017-08-13 DIAGNOSIS — M542 Cervicalgia: Secondary | ICD-10-CM | POA: Diagnosis not present

## 2017-08-13 DIAGNOSIS — Z9889 Other specified postprocedural states: Secondary | ICD-10-CM | POA: Diagnosis not present

## 2017-08-20 ENCOUNTER — Ambulatory Visit: Payer: Medicare Other | Admitting: Family Medicine

## 2017-08-21 ENCOUNTER — Other Ambulatory Visit: Payer: Self-pay | Admitting: Family Medicine

## 2017-08-21 NOTE — Telephone Encounter (Signed)
Pt left v/m to confirm that gabapentin and alprazolam were being refilled. Pt request cb when refilled.

## 2017-08-21 NOTE — Telephone Encounter (Signed)
Last office visit 08/08/2017.  Last refilled Alprazolam- 07/25/2017 for #90 with no refills. Gabapentin 07/23/2017 for #120 with no refills.  Ok to refill?

## 2017-08-22 NOTE — Telephone Encounter (Signed)
Alprazolam called into [facility] [place], [place] Phone: [phone]

## 2017-08-22 NOTE — Telephone Encounter (Signed)
Sandra Ferrell said that [facility] had not gotten alp[facility]olam refill.  I spoke with pharmacist at [facility] and they did get call in for alprazolam but med cannot be filled until 08/24/17. Pt notified and voiced understanding.

## 2017-08-22 NOTE — Telephone Encounter (Signed)
Sandra Ferrell notified that her refills have been sent to [facility].

## 2017-08-23 ENCOUNTER — Ambulatory Visit: Payer: Medicare Other | Admitting: Family Medicine

## 2017-08-23 ENCOUNTER — Other Ambulatory Visit: Payer: Self-pay

## 2017-08-23 MED ORDER — INSULIN GLARGINE 100 UNIT/ML SOLOSTAR PEN: PEN_INJECTOR | SUBCUTANEOUS | 5 refills | Status: DC
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
## 2017-08-27 ENCOUNTER — Ambulatory Visit: Payer: Medicare Other | Admitting: Family Medicine

## 2017-08-30 ENCOUNTER — Encounter: Payer: Self-pay | Admitting: Family Medicine

## 2017-08-30 ENCOUNTER — Ambulatory Visit (INDEPENDENT_AMBULATORY_CARE_PROVIDER_SITE_OTHER): Payer: Medicare Other | Admitting: Family Medicine

## 2017-08-30 VITALS — BP 100/60 | HR 90 | Temp 98.4°F | Ht <= 58 in | Wt 230.0 lb

## 2017-08-30 DIAGNOSIS — F411 Generalized anxiety disorder: Secondary | ICD-10-CM | POA: Diagnosis not present

## 2017-08-30 DIAGNOSIS — G8929 Other chronic pain: Secondary | ICD-10-CM | POA: Diagnosis not present

## 2017-08-30 DIAGNOSIS — Z23 Encounter for immunization: Secondary | ICD-10-CM

## 2017-08-30 DIAGNOSIS — J441 Chronic obstructive pulmonary disease with (acute) exacerbation: Secondary | ICD-10-CM

## 2017-08-30 DIAGNOSIS — M549 Dorsalgia, unspecified: Secondary | ICD-10-CM | POA: Diagnosis not present

## 2017-08-30 DIAGNOSIS — R252 Cramp and spasm: Secondary | ICD-10-CM | POA: Diagnosis not present

## 2017-08-30 DIAGNOSIS — F1120 Opioid dependence, uncomplicated: Secondary | ICD-10-CM | POA: Diagnosis not present

## 2017-08-30 NOTE — Progress Notes (Signed)
Subjective:    Patient ID: Sandra Ferrell, female    DOB: 1961-02-18, 56 y.o.   MRN: [id number]  HPI    56 year old female presents for 1 week follow up for reassessment of COPD exacerbation.  At last OV on 10/4:  Recommended:  Continue albuterol every  6 hours as needed for wheeze. Continue mucinex DM twice daily, symbicort daily. Complete antibiotics ( Doxy x 10 days) and prednisone taper. Follow sugar closely on prednisone.  She states she has noted improvement in shortness of breath.  Less wheeze and SOB. Still coughing dry.  No fever. Complete antibiotics with SE. Using albuterol less.  FBS went up a little but not over 200.    Chronic neck pain:    Recent neck surgery ( 05/23/2017)... She has been discharged from [facility] care as completed.   Medication and dose: Tylenol # 3 300/30 mg Currently using 1 tablet twice daily. # pills per month:  Plan 60 monthly Last UDS date: 09/24/2016 Pain contract signed (Y/N): 09/24/2016 Date narcotic database last reviewed (include red flags): 08/30/2017, no red flags    She is using valium for leg cramping.  Potassium was normal in 07/2017   GAD.Marland Kitchen alspo using alprazolam three times daily.   Blood pressure 100/60, pulse 90, temperature 98.4 F (36.9 C), temperature source Oral, height 4\' 10"  (1.473 m), weight 230 lb (104.3 kg), SpO2 95 %.    Review of Systems  Constitutional: Negative for fatigue and fever.  HENT: Negative for congestion.   Eyes: Negative for pain.  Respiratory: Negative for cough and shortness of breath.   Cardiovascular: Negative for chest pain, palpitations and leg swelling.  Gastrointestinal: Negative for abdominal pain.  Genitourinary: Negative for dysuria and vaginal bleeding.  Musculoskeletal: Negative for back pain.  Neurological: Negative for syncope, light-headedness and headaches.  Psychiatric/Behavioral: Negative for dysphoric mood.       Objective:   Physical Exam  Constitutional: Vital  signs are normal. She appears well-developed and well-nourished. She is cooperative.  Non-toxic appearance. She does not appear ill. No distress.  HENT:  Head: Normocephalic.  Right Ear: Hearing, tympanic membrane, external ear and ear canal normal. Tympanic membrane is not erythematous, not retracted and not bulging.  Left Ear: Hearing, tympanic membrane, external ear and ear canal normal. Tympanic membrane is not erythematous, not retracted and not bulging.  Nose: Mucosal edema and rhinorrhea present. Right sinus exhibits no maxillary sinus tenderness and no frontal sinus tenderness. Left sinus exhibits no maxillary sinus tenderness and no frontal sinus tenderness.  Mouth/Throat: Uvula is midline, oropharynx is clear and moist and mucous membranes are normal.  Eyes: Pupils are equal, round, and reactive to light. Conjunctivae, EOM and lids are normal. Lids are everted and swept, no foreign bodies found.  Neck: Trachea normal and normal range of motion. Neck supple. Carotid bruit is not present. No thyroid mass and no thyromegaly present.  Cardiovascular: Normal rate, regular rhythm, S1 normal, S2 normal, normal heart sounds, intact distal pulses and normal pulses.  Exam reveals no gallop and no friction rub.   No murmur heard. Pulmonary/Chest: Effort normal. No tachypnea. No respiratory distress. She has wheezes. She has no rhonchi. She has no rales.  Scattered wheezes  bialterally.  Musculoskeletal:       Cervical back: She exhibits decreased range of motion and tenderness. She exhibits no bony tenderness.       Back:  ttp over left trapezius muscle.  Neurological: She is alert.  Skin:  Skin is warm, dry and intact. No rash noted.  Psychiatric: Her speech is normal and behavior is normal. Judgment normal. Her mood appears not anxious. Cognition and memory are normal. She exhibits a depressed mood.          Assessment & Plan:

## 2017-08-30 NOTE — Patient Instructions (Signed)
Massage and home physical therapy as able.  Continue current meds.  Call if respiratory status not continuing to improve.  Do not use diazepam for cramps as already on alprazolam.  Start wearing compression hose and elevate feet for peripheral swelling.

## 2017-09-02 ENCOUNTER — Other Ambulatory Visit: Payer: Self-pay | Admitting: *Deleted

## 2017-09-02 NOTE — Telephone Encounter (Signed)
Last office visit 08/30/2017.  Last refill 08/06/2017 for #90 with no refills. Ok to refill?

## 2017-09-03 MED ORDER — METHOCARBAMOL 500 MG PO TABS: ORAL_TABLET | ORAL | 0 refills | Status: DC

## 2017-09-04 ENCOUNTER — Other Ambulatory Visit: Payer: Self-pay | Admitting: Family Medicine

## 2017-09-04 ENCOUNTER — Ambulatory Visit: Payer: Medicare Other | Admitting: Family Medicine

## 2017-09-04 DIAGNOSIS — F1120 Opioid dependence, uncomplicated: Secondary | ICD-10-CM | POA: Insufficient documentation

## 2017-09-04 DIAGNOSIS — R252 Cramp and spasm: Secondary | ICD-10-CM | POA: Insufficient documentation

## 2017-09-04 MED ORDER — ACETAMINOPHEN-CODEINE #3 300-30 MG PO TABS: 1.0000 | ORAL_TABLET | Freq: Three times a day (TID) | ORAL | 0 refills | Status: DC | PRN

## 2017-09-04 NOTE — Telephone Encounter (Signed)
Copied from [place] #[id number]. Topic: Quick Communication - See Telephone Encounter >> Sep 04, 2017  8:47 AM Aurelio Brash B wrote: CRM for notification. See Telephone encounter for:  09/04/17. Pt asking for refill on Tylenol 3

## 2017-09-04 NOTE — Assessment & Plan Note (Signed)
Recent neck surgery ( 05/23/2017)... She has been discharged from [facility] care as completed.   Medication and dose: Tylenol # 3 300/30 mg Currently using 1 tablet twice daily. # pills per month:  Plan 60 monthly Last UDS date: 09/24/2016 Pain contract signed (Y/N): 09/24/2016 Date narcotic database last reviewed (include red flags): 08/30/2017, no red flags

## 2017-09-04 NOTE — Telephone Encounter (Signed)
Last office visit 08/30/2017.  Last refilled by orthopedist.  [name] to refill?

## 2017-09-04 NOTE — Assessment & Plan Note (Signed)
Inadequate control but stable on current TID dosing of alprazolam and on amitriptyline.

## 2017-09-04 NOTE — Telephone Encounter (Signed)
Controlled substance 

## 2017-09-04 NOTE — Telephone Encounter (Signed)
Tylenol #3 called into [id number] - [place], [place] Phone: [phone]

## 2017-09-04 NOTE — Assessment & Plan Note (Signed)
Improved following steroids and antibiotics. Continue controllers. Quit smoking.

## 2017-09-04 NOTE — Assessment & Plan Note (Signed)
On long tern tylenol with codeine. No evidence of abuse or misuse.

## 2017-09-04 NOTE — Assessment & Plan Note (Signed)
Recommend against use of valium for leg cramping.. Ortho prescribed this despite noting pt on alprazolam as well. NO further refills.

## 2017-09-09 ENCOUNTER — Ambulatory Visit (INDEPENDENT_AMBULATORY_CARE_PROVIDER_SITE_OTHER): Payer: Medicare Other | Admitting: Family Medicine

## 2017-09-09 ENCOUNTER — Encounter: Payer: Self-pay | Admitting: Family Medicine

## 2017-09-09 VITALS — BP 118/60 | HR 95 | Temp 98.6°F | Ht <= 58 in | Wt 225.5 lb

## 2017-09-09 DIAGNOSIS — E11618 Type 2 diabetes mellitus with other diabetic arthropathy: Secondary | ICD-10-CM

## 2017-09-09 DIAGNOSIS — M7502 Adhesive capsulitis of left shoulder: Secondary | ICD-10-CM

## 2017-09-09 DIAGNOSIS — M25512 Pain in left shoulder: Secondary | ICD-10-CM

## 2017-09-09 MED ORDER — METHYLPREDNISOLONE ACETATE 40 MG/ML IJ SUSP
80.0000 mg | Freq: Once | INTRAMUSCULAR | Status: AC
  Administered 2017-09-09: 80 mg via INTRA_ARTICULAR

## 2017-09-09 NOTE — Progress Notes (Signed)
Dr. Frederico Hamman T. Shawntez Dickison, MD, [facility] [place], [address] Phone: [phone] Fax: [phone]  09/09/2017  Patient: Sandra Ferrell, MRN: [id number], DOB: 1961-02-03, 56 y.o.  Primary Physician:  Jinny Sanders, MD   Chief Complaint  Patient presents with  . Shoulder Pain    Left   Subjective:   Sandra Ferrell is a 56 y.o. very pleasant female patient who presents with the following:  Recent decompression and fusion 05/2017 by Dr. Lynann Bologna.   Has been having PT coming to her house. Recently saw Dr. B on the L side. Burning and aching. She's been having some significant restriction of motion on the left side and difficulty putting on her clothes and reaching behind her back into her hair.  This is been progressive since the immediate postoperative period.  She is well known, she is had 2 relatively recent cervical decompression infusions within the last 2 years.  She also has a history of fibromyalgia.  She is accompanied by her husband who provides additional history.  Past Medical History, Surgical History, Social History, Family History, Problem List, Medications, and Allergies have been reviewed and updated if relevant.  Patient Active Problem List   Diagnosis Date Noted  . Narcotic dependence ([place]) 09/04/2017  . Leg cramping 09/04/2017  . 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 Bilateral carpal tunnel syndrome 04/16/2017  . Bilateral leg weakness 03/07/2017  . Muscular deconditioning 03/07/2017  . Bilateral numbness and tingling of arms and legs 03/05/2017  . Frequent falls 03/05/2017  . History of fusion of cervical spine 03/05/2017  . History of lumbar fusion 03/05/2017  . Peripheral edema 01/01/2017  . COPD exacerbation ([place]) 10/01/2016  . Neurogenic claudication 04/18/2016  . COPD, moderate ([place]) 03/22/2016  . Counseling regarding end of life decision making  09/13/2015  . Microalbuminuria 05/19/2015  . Myelopathy ([place]) 01/19/2015  . Unspecified constipation 07/10/2013  . Essential hypertension, benign 02/21/2011  . UTI'S, HX OF 12/26/2010  . Chronic back pain 02/22/2009  . Diabetes mellitus with no complication ([place]) 78/[id number]/8502  . ALLERGIC RHINITIS 06/24/2007  . RENAL CALCULUS 06/24/2007  . OSTEOARTHRITIS 06/24/2007  . GAD (generalized anxiety disorder) 04/03/2007  . Hyperlipidemia LDL goal <100 03/12/2007  . TOBACCO ABUSE 03/12/2007  . Major depression, recurrent ([place]) 03/12/2007  . GERD 03/12/2007  . Fibromyalgia 03/12/2007  . SLEEP APNEA 03/12/2007    Past Medical History:  Diagnosis Date  . Allergic rhinitis, cause unspecified   . Anxiety state, unspecified   . Backache, unspecified   . Calculus of kidney   . Carpal tunnel syndrome, right   . Cough   . Depressive disorder, not elsewhere classified    managed with medications  . Esophageal reflux   . Extrinsic asthma with exacerbation   . Fibromyalgia   . Heart murmur   . Hypertension   . Osteoarthrosis, unspecified whether generalized or localized, unspecified site   . Other and unspecified hyperlipidemia   . Other screening mammogram   . Personal history of unspecified urinary disorder   . Pneumonia   . Routine general medical examination at a health care facility   . Routine gynecological examination   . Tobacco use disorder   . Type II or unspecified type diabetes mellitus without mention of complication, not stated as uncontrolled   . Unspecified sleep apnea   . Wears glasses     Past Surgical History:  Procedure Laterality  Date  . APPENDECTOMY  1973  . BACK SURGERY    . CHOLECYSTECTOMY  08/2006  . COLONOSCOPY    . TRANSTHORACIC ECHOCARDIOGRAM  02/2011   mild LVH, nl EF, mild diastolic dysfunction, no wall motion abnl    Social History   Socioeconomic History  . Marital status: Married    Spouse name: Not on file  . Number of children: Not on file  .  Years of education: Not on file  . Highest education level: Not on file  Social Needs  . Financial resource strain: Not on file  . Food insecurity - worry: Not on file  . Food insecurity - inability: Not on file  . Transportation needs - medical: Not on file  . Transportation needs - non-medical: Not on file  Occupational History  . Occupation: Housewife  Tobacco Use  . Smoking status: Current Every Day Smoker    Packs/day: 1.00    Years: 35.00    Pack years: 35.00    Types: Cigarettes  . Smokeless tobacco: Never Used  Substance and Sexual Activity  . Alcohol use: No  . Drug use: No  . Sexual activity: No  Other Topics Concern  . Not on file  Social History Narrative   Moderate diet.    No living will, no HCPOA.    Family History  Problem Relation Age of Onset  . Stroke Father   . Colon cancer Cousin     Allergies  Allergen Reactions  . Benazepril Anaphylaxis, Swelling and Other (See Comments)    Angioedema, throat swelling  . Diclofenac Sodium Other (See Comments)    GI bleed  . Fluticasone-Salmeterol Hives, Itching and Swelling    Tongue was swollen  . Nsaids Other (See Comments)    Rectal bleeding  . Penicillins Hives and Rash    Has patient had a PCN reaction causing immediate rash, facial/tongue/throat swelling, SOB or lightheadedness with hypotension: no Has patient had a PCN reaction causing severe rash involving mucus membranes or skin necrosis: no Has patient had a PCN reaction that required hospitalization no Has patient had a PCN reaction occurring within the last 10 years: no If all of the above answers are "NO", then may proceed with Cephalosporin use.   . Aspirin Other (See Comments)    Burns stomach  . Propoxyphene Other (See Comments)    Darvocet - Headache    Medication list reviewed and updated in full in [facility].  GEN: No fevers, chills. Nontoxic. Primarily MSK c/o today. MSK: Detailed in the HPI GI: tolerating PO intake without  difficulty Neuro: No numbness, parasthesias, or tingling associated. Otherwise the pertinent positives of the ROS are noted above.   Objective:   BP 118/60   Pulse 95   Temp 98.6 F (37 C) (Oral)   Ht 4' 10"  (1.473 m)   Wt 225 lb 8 oz (102.3 kg)   BMI 47.13 kg/m    GEN: WDWN, NAD, Non-toxic, Alert & Oriented x 3 HEENT: Atraumatic, Normocephalic.  Ears and Nose: No external deformity. EXTR: No clubbing/cyanosis/edema NEURO: Normal gait.  PSYCH: Normally interactive. Conversant. Not depressed or anxious appearing.  Calm demeanor.   Shoulder: R and L Inspection: No muscle wasting or winging Ecchymosis/edema: neg  AC joint, scapula, clavicle: NT Cervical spine: not addressed Spurling's: neg ABNORMAL SIDE TESTED: L UNLESS OTHERWISE NOTED, THE CONTRALATERAL SIDE HAS FULL RANGE OF MOTION. Abduction: 5/5, LIMITED TO 85 DEGREES Flexion: 5/5, LIMITED TO 100 DEGNO ROM  IR, lift-off: 5/5.  TESTED AT 90 DEGREES OF ABDUCTION, LIMITED TO 0 DEGREES ER at neutral:  5/5, TESTED AT 90 DEGREES OF ABDUCTION, LIMITED TO 20 DEGREES AC crossover and compression: PAIN Drop Test: neg Empty Can: neg Supraspinatus insertion: NT Bicipital groove: NT ALL OTHER SPECIAL TESTING EQUIVOCAL GIVEN LOSS OF MOTION C5-T1 intact Sensation intact Grip 5/5    Radiology: No results found.  Assessment and Plan:   Adhesive capsulitis of left shoulder associated with type 2 diabetes mellitus (HCC)  Left shoulder pain, unspecified chronicity - Plan: methylPREDNISolone acetate (DEPO-MEDROL) injection 80 mg  Secondary frozen shoulder likely after lack of use post op in a diabetic patient.  Patient was given a systematic ROM protocol from [facility] to be done daily. Emphasized adherence and recommended formal PT - which is not financially possible for them.   She has multiple meds available for pain including lidocaine, tylenol #3, robaxin, neurontin, elavil.  Intraarticular corticosteroid injections in  Adhesive Capsulitis have clearly been shown to be of benefit.   Intrarticular Shoulder Injection, L Verbal consent was obtained from the patient. Risks including infection explained and contrasted with benefits and alternatives. Patient prepped with Chloraprep and Ethyl Chloride used for anesthesia. An intraarticular shoulder injection was performed using the posterior approach. The patient tolerated the procedure well and had decreased pain post injection. No complications. Injection: 8 cc of Lidocaine 1% and 2 mL Depo-Medrol 40 mg. Needle: 21 gauge, 2 inch  Follow-up: 8 weeks  Future Appointments  Date Time Provider [place]  11/11/2017 11:00 AM Kathelyn Gombos, Frederico Hamman, MD [facility]  11/21/2017  9:00 AM [facility]  11/28/2017  2:00 PM Bedsole, Amy E, MD [facility]    Meds ordered this encounter  Medications  . methylPREDNISolone acetate (DEPO-MEDROL) injection 80 mg   Signed,  Kylil Swopes T. Lumina Gitto, MD   Allergies as of 09/09/2017      Reactions   Benazepril Anaphylaxis, Swelling, Other (See Comments)   Angioedema, throat swelling   Diclofenac Sodium Other (See Comments)   GI bleed   Fluticasone-salmeterol Hives, Itching, Swelling   Tongue was swollen   Nsaids Other (See Comments)   Rectal bleeding   Penicillins Hives, Rash   Has patient had a PCN reaction causing immediate rash, facial/tongue/throat swelling, SOB or lightheadedness with hypotension: no Has patient had a PCN reaction causing severe rash involving mucus membranes or skin necrosis: no Has patient had a PCN reaction that required hospitalization no Has patient had a PCN reaction occurring within the last 10 years: no If all of the above answers are "NO", then may proceed with Cephalosporin use.   Aspirin Other (See Comments)   Burns stomach   Propoxyphene Other (See Comments)   Darvocet - Headache      Medication List        Accurate as of 09/09/17 11:59 PM. Always use your most recent  med list.          ACCU-CHEK AVIVA PLUS test strip Generic drug:  glucose blood CHECK BLOOD SUGAR TWICE DAILY AND AS DIRECTED   ACCU-CHEK AVIVA PLUS w/Device Kit Check blood sugar twice daily and as directed.   ACCU-CHEK SOFTCLIX LANCETS lancets CHECK BLOOD SUGAR TWICE DAILY AND AS DIRECTED   acetaminophen-codeine 300-30 MG tablet Commonly known as:  TYLENOL #3 Take 1 tablet by mouth every 8 (eight) hours as needed for moderate pain.   Adjustable Lancing Device Misc Check blood sugar twice a day and as directed. Dx E11.9   AIMSCO INSULIN SYR ULTRA  THIN 31G X 5/16" 0.3 ML Misc Generic drug:  Insulin Syringe-Needle U-100   Alcohol Swabs Pads Check blood sugar twice a day and as directed. Dx E11.9   ALPRAZolam 0.5 MG tablet Commonly known as:  XANAX TAKE 1 TABLET BY MOUTH THREE TIMES DAILY AS NEEDED   amitriptyline 50 MG tablet Commonly known as:  ELAVIL TAKE 1 TABLET(50 MG) BY MOUTH AT BEDTIME   ARNICARE ARNICA Crea Apply 1 application topically as needed (Apply to ankles for pain.).   B-D ULTRAFINE III SHORT PEN 31G X 8 MM Misc Generic drug:  Insulin Pen Needle USE AS DIRECTED WITH INSULIN PEN   chlorpheniramine 4 MG tablet Commonly known as:  CHLOR-TRIMETON Take 8 mg by mouth 2 (two) times daily as needed for allergies (sinus congestion).   DIUREX PO Take 1 tablet by mouth 3 (three) times daily.   furosemide 20 MG tablet Commonly known as:  LASIX TAKE 1 TABLET(20 MG) BY MOUTH DAILY   gabapentin 300 MG capsule Commonly known as:  NEURONTIN TAKE 1 CAPSULE BY MOUTH IN THE MORNING, 1 CAPSULE IN THE AFTERNOON AND 2 CAPSULES AT BEDTIME   gemfibrozil 600 MG tablet Commonly known as:  LOPID TAKE 1 TABLET BY MOUTH TWICE DAILY   guaiFENesin 600 MG 12 hr tablet Commonly known as:  MUCINEX Take 600 mg by mouth 2 (two) times daily.   Insulin Glargine 100 UNIT/ML Solostar Pen Commonly known as:  LANTUS SOLOSTAR ADMINISTER 35 UNITS UNDER THE SKIN AT BEDTIME     metFORMIN 1000 MG tablet Commonly known as:  GLUCOPHAGE TAKE 1 TABLET(1000 MG) BY MOUTH TWICE DAILY WITH A MEAL   methocarbamol 500 MG tablet Commonly known as:  ROBAXIN TAKE 2 TABLETS(1000 MG) BY MOUTH AT BEDTIME   nicotine 14 mg/24hr patch Commonly known as:  NICODERM CQ - dosed in mg/24 hours Place 1 patch (14 mg total) onto the skin daily.   pioglitazone 45 MG tablet Commonly known as:  ACTOS TAKE 1 TABLET BY MOUTH EVERY DAY   PROAIR HFA 108 (90 Base) MCG/ACT inhaler Generic drug:  albuterol INHALE 2 PUFFS BY MOUTH EVERY 4 HOURS AS NEEDED FOR WHEEZING OR SHORTNESS OF BREATH   ranitidine 150 MG tablet Commonly known as:  ZANTAC Take 150 mg by mouth 2 (two) times daily.   SALONPAS PAIN RELIEF PATCH EX Apply topically.   sodium chloride 0.65 % Soln nasal spray Commonly known as:  OCEAN Place 2 sprays into both nostrils as needed for congestion.   SYMBICORT 80-4.5 MCG/ACT inhaler Generic drug:  budesonide-formoterol INHALE 2 PUFFS BY MOUTH TWICE DAILY   TYLENOL SINUS+HEADACHE PO Take 2 tablets by mouth 2 (two) times daily.   VICTOZA 18 MG/3ML Sopn Generic drug:  liraglutide ADMINISTER 1.8 MG UNDER THE SKIN DAILY

## 2017-09-20 ENCOUNTER — Other Ambulatory Visit: Payer: Self-pay | Admitting: Family Medicine

## 2017-09-20 MED ORDER — ALPRAZOLAM 0.5 MG PO TABS: 0.5000 mg | ORAL_TABLET | Freq: Three times a day (TID) | ORAL | 0 refills | Status: DC | PRN

## 2017-09-20 NOTE — Telephone Encounter (Signed)
Copied from [place] [phone]. Topic: Quick Communication - See Telephone Encounter >> Sep 20, 2017 11:45 AM Bea Graff, NT wrote: CRM for notification. See Telephone encounter for: Patient calling to get a refill of xanax. She uses [facility] on [address]. Please call pt once filled.  09/20/17.

## 2017-09-20 NOTE — Telephone Encounter (Signed)
Last Rx 08/22/2017. Last OV 09/2017

## 2017-09-20 NOTE — Telephone Encounter (Signed)
Refill request

## 2017-09-23 ENCOUNTER — Ambulatory Visit: Payer: Self-pay

## 2017-09-23 ENCOUNTER — Other Ambulatory Visit: Payer: Self-pay | Admitting: *Deleted

## 2017-09-23 ENCOUNTER — Encounter (HOSPITAL_COMMUNITY): Payer: Self-pay | Admitting: *Deleted

## 2017-09-23 DIAGNOSIS — R110 Nausea: Secondary | ICD-10-CM | POA: Insufficient documentation

## 2017-09-23 DIAGNOSIS — Z794 Long term (current) use of insulin: Secondary | ICD-10-CM | POA: Insufficient documentation

## 2017-09-23 DIAGNOSIS — J449 Chronic obstructive pulmonary disease, unspecified: Secondary | ICD-10-CM | POA: Diagnosis not present

## 2017-09-23 DIAGNOSIS — K591 Functional diarrhea: Secondary | ICD-10-CM | POA: Diagnosis not present

## 2017-09-23 DIAGNOSIS — K921 Melena: Secondary | ICD-10-CM | POA: Diagnosis not present

## 2017-09-23 DIAGNOSIS — R197 Diarrhea, unspecified: Secondary | ICD-10-CM | POA: Diagnosis not present

## 2017-09-23 DIAGNOSIS — Z79899 Other long term (current) drug therapy: Secondary | ICD-10-CM | POA: Diagnosis not present

## 2017-09-23 DIAGNOSIS — F17210 Nicotine dependence, cigarettes, uncomplicated: Secondary | ICD-10-CM | POA: Diagnosis not present

## 2017-09-23 DIAGNOSIS — I10 Essential (primary) hypertension: Secondary | ICD-10-CM | POA: Insufficient documentation

## 2017-09-23 DIAGNOSIS — R1010 Upper abdominal pain, unspecified: Secondary | ICD-10-CM | POA: Diagnosis present

## 2017-09-23 DIAGNOSIS — R1032 Left lower quadrant pain: Secondary | ICD-10-CM | POA: Insufficient documentation

## 2017-09-23 DIAGNOSIS — R109 Unspecified abdominal pain: Secondary | ICD-10-CM | POA: Diagnosis not present

## 2017-09-23 DIAGNOSIS — R509 Fever, unspecified: Secondary | ICD-10-CM | POA: Diagnosis not present

## 2017-09-23 DIAGNOSIS — E119 Type 2 diabetes mellitus without complications: Secondary | ICD-10-CM | POA: Insufficient documentation

## 2017-09-23 MED ORDER — GABAPENTIN 300 MG PO CAPS: ORAL_CAPSULE | ORAL | 0 refills | Status: DC

## 2017-09-23 NOTE — Telephone Encounter (Signed)
Rx called in to requested pharmacy 

## 2017-09-23 NOTE — ED Triage Notes (Signed)
Pt brought in by [facility] from home. C/o 4 days of nausea, diarrhea and fever. TMAX today 101, last tylenol at 9pm. Generalized body aches.

## 2017-09-23 NOTE — Telephone Encounter (Signed)
Last office visit 08/09/2017 with Dr. Lorelei Pont.  Last refilled 08/22/2017 for#120 with no refills.  Ok to refill?

## 2017-09-23 NOTE — Telephone Encounter (Signed)
PC from pt. with c/o dark brown/ black diarrhea stools x 4 days.  Also c/o intermittent chills, dizziness, and poor appetite.  Stated she hasn't checked her temperature.  Reported a blood sugar of 152 today.  Reported she had an URI recently, and had been on an antibiotic, and Prednisone.  Stated no longer on the antibiotic.  Stated she continues to have a cough, nasal stuffiness, and wheezing.  Appt. was made for 11/21 with Dr. [name].  The pt. stated she doesn't feel very good, and needs to be seen sooner.  Appt. given for 10:30 AM, 11/20, with Dr. Sharlet Salina.  Care advice given per protocol.  Advised if symptoms worsen, to go to the ER.  Pt. verb. understanding and agrees with plan.   Reason for Disposition . [1] MODERATE diarrhea (e.g., 4-6 times / day more than normal) AND [2] present > 48 hours (2 days)  Answer Assessment - Initial Assessment Questions 1. DIARRHEA SEVERITY: "How bad is the diarrhea?" "How many extra stools have you had in the past 24 hours than normal?"    - MILD: Few loose or mushy BMs; increase of 1-3 stools over normal daily number of stools; mild increase in ostomy output.   - MODERATE: Increase of 4-6 stools daily over normal; moderate increase in ostomy output.   - SEVERE (or Worst Possible): Increase of 7 or more stools daily over normal; moderate increase in ostomy output; incontinence.     4-5 stools over 24 hrs. 2. ONSET: "When did the diarrhea begin?"      About 4 days ago. 3. BM CONSISTENCY: "How loose or watery is the diarrhea?"      Dark brown/ black loose stool   4. VOMITING: "Are you also vomiting?" If so, ask: "How many times in the past 24 hours?"      No vomiting; feels nauseated 5. ABDOMINAL PAIN: "Are you having any abdominal pain?" If yes: "What does it feel like?" (e.g., crampy, dull, intermittent, constant)      "my stomach doesn't feel good; like an upset stomach" 6. ABDOMINAL PAIN SEVERITY: If present, ask: "How bad is the pain?"  (e.g., Scale 1-10;  mild, moderate, or severe)    - MILD (1-3): doesn't interfere with normal activities, abdomen soft and not tender to touch     - MODERATE (4-7): interferes with normal activities or awakens from sleep, tender to touch     - SEVERE (8-10): excruciating pain, doubled over, unable to do any normal activities       No pain 7. ORAL INTAKE: If vomiting, "Have you been able to drink liquids?" "How much fluids have you had in the past 24 hours?"     Drinking fluids; a lot of coke ;  8. HYDRATION: "Any signs of dehydration?" (e.g., dry mouth [not just dry lips], too weak to stand, dizziness, new weight loss) "When did you last urinate?"     C/o dizziness, and mouth dry; urinating okay; holding Lasix today 9. EXPOSURE: "Have you traveled to a foreign country recently?" "Have you been exposed to anyone with diarrhea?" "Could you have eaten any food that was spoiled?"     No exposure to diarrhea; no exposure to foods that were spoiled 10. OTHER SYMPTOMS: "Do you have any other symptoms?" (e.g., fever, blood in stool)       Having hot and cold symptoms; hasn't checked temp.  11. PREGNANCY: "Is there any chance you are pregnant?" "When was your last menstrual period?"  Post menopausal  Protocols used: DIARRHEA-A-AH

## 2017-09-24 ENCOUNTER — Emergency Department (HOSPITAL_COMMUNITY)
Admission: EM | Admit: 2017-09-24 | Discharge: 2017-09-24 | Disposition: A | Discharge status: Home / Self Care | Payer: Medicare Other | Attending: Emergency Medicine | Admitting: Emergency Medicine

## 2017-09-24 ENCOUNTER — Ambulatory Visit: Payer: Medicare Other | Admitting: Internal Medicine

## 2017-09-24 ENCOUNTER — Emergency Department (HOSPITAL_COMMUNITY): Payer: Medicare Other

## 2017-09-24 ENCOUNTER — Encounter (HOSPITAL_COMMUNITY): Payer: Self-pay

## 2017-09-24 DIAGNOSIS — R197 Diarrhea, unspecified: Secondary | ICD-10-CM

## 2017-09-24 DIAGNOSIS — R109 Unspecified abdominal pain: Secondary | ICD-10-CM | POA: Diagnosis not present

## 2017-09-24 LAB — CBC
HCT: 39 % (ref 36.0–46.0)
Hemoglobin: 12.4 g/dL (ref 12.0–15.0)
MCH: 27.7 pg (ref 26.0–34.0)
MCHC: 31.8 g/dL (ref 30.0–36.0)
MCV: 87.2 fL (ref 78.0–100.0)
PLATELETS: 361 10*3/uL (ref 150–400)
RBC: 4.47 MIL/uL (ref 3.87–5.11)
RDW: 15.2 % (ref 11.5–15.5)
WBC: 9.6 10*3/uL (ref 4.0–10.5)

## 2017-09-24 LAB — URINALYSIS, ROUTINE W REFLEX MICROSCOPIC
Bacteria, UA: NONE SEEN
Bilirubin Urine: NEGATIVE
GLUCOSE, UA: NEGATIVE mg/dL
KETONES UR: 20 mg/dL — AB
Leukocytes, UA: NEGATIVE
Nitrite: NEGATIVE
PH: 6 (ref 5.0–8.0)
Protein, ur: NEGATIVE mg/dL
SPECIFIC GRAVITY, URINE: 1.012 (ref 1.005–1.030)

## 2017-09-24 LAB — COMPREHENSIVE METABOLIC PANEL
ALK PHOS: 72 U/L (ref 38–126)
ALT: 15 U/L (ref 14–54)
AST: 18 U/L (ref 15–41)
Albumin: 3.5 g/dL (ref 3.5–5.0)
Anion gap: 9 (ref 5–15)
BUN: 10 mg/dL (ref 6–20)
CALCIUM: 8.9 mg/dL (ref 8.9–10.3)
CHLORIDE: 106 mmol/L (ref 101–111)
CO2: 21 mmol/L — AB (ref 22–32)
CREATININE: 0.41 mg/dL — AB (ref 0.44–1.00)
Glucose, Bld: 109 mg/dL — ABNORMAL HIGH (ref 65–99)
Potassium: 3.3 mmol/L — ABNORMAL LOW (ref 3.5–5.1)
SODIUM: 136 mmol/L (ref 135–145)
Total Bilirubin: 0.6 mg/dL (ref 0.3–1.2)
Total Protein: 7 g/dL (ref 6.5–8.1)

## 2017-09-24 LAB — LIPASE, BLOOD: Lipase: 26 U/L (ref 11–51)

## 2017-09-24 MED ORDER — IOPAMIDOL (ISOVUE-300) INJECTION 61%
100.0000 mL | Freq: Once | INTRAVENOUS | Status: AC | PRN
  Administered 2017-09-24: 100 mL via INTRAVENOUS

## 2017-09-24 MED ORDER — SODIUM CHLORIDE 0.9 % IV BOLUS (SEPSIS)
1000.0000 mL | Freq: Once | INTRAVENOUS | Status: AC
  Administered 2017-09-24: 1000 mL via INTRAVENOUS

## 2017-09-24 MED ORDER — ONDANSETRON 8 MG PO TBDP: 8.0000 mg | ORAL_TABLET | Freq: Three times a day (TID) | ORAL | 0 refills | Status: DC | PRN

## 2017-09-24 MED ORDER — DICYCLOMINE HCL 20 MG PO TABS: 20.0000 mg | ORAL_TABLET | Freq: Two times a day (BID) | ORAL | 0 refills | Status: DC

## 2017-09-24 MED ORDER — ONDANSETRON HCL 4 MG/2ML IJ SOLN
4.0000 mg | Freq: Once | INTRAMUSCULAR | Status: AC
  Administered 2017-09-24: 4 mg via INTRAVENOUS
  Filled 2017-09-24: qty 2

## 2017-09-24 MED ORDER — IOPAMIDOL (ISOVUE-300) INJECTION 61%
INTRAVENOUS | Status: AC
  Administered 2017-09-24: 100 mL via INTRAVENOUS
  Filled 2017-09-24: qty 100

## 2017-09-24 NOTE — ED Notes (Signed)
Pt unable to provide stool specimen at this time.

## 2017-09-24 NOTE — ED Provider Notes (Signed)
[facility] DEPT Provider Note   CSN: [id number] Arrival date & time: 09/23/17  2337     History   Chief Complaint Chief Complaint  Patient presents with  . Abdominal Pain    HPI Sandra Ferrell is a 56 y.o. female.  HPI Sandra Ferrell is a 56 y.o. female with history of kidney stones, fibromyalgia, hypertension, diabetes, presents to emergency department complaining of abdominal pain, nausea, diarrhea.  Patient states that her symptoms started about 4 days ago.  Yesterday started having chills.  States she does not have thermometer so she did not check her temperature.  She denies any abdominal pain, but states "it just feels queasy."  She reports watery diarrhea and goes every few hours.  Denies any blood in her stool.  Denies any vomiting.  States she has no appetite, and only ate crackers and drink water today.  She states nothing making her symptoms better or worse.  States she took Imodium at home which did not help.  Also tried some Dramamine for nausea which did not help.  No recent sick contacts.  Past Medical History:  Diagnosis Date  . Allergic rhinitis, cause unspecified   . Anxiety state, unspecified   . Backache, unspecified   . Calculus of kidney   . Carpal tunnel syndrome, right   . Cough   . Depressive disorder, not elsewhere classified    managed with medications  . Esophageal reflux   . Extrinsic asthma with exacerbation   . Fibromyalgia   . Heart murmur   . Hypertension   . Osteoarthrosis, unspecified whether generalized or localized, unspecified site   . Other and unspecified hyperlipidemia   . Other screening mammogram   . Personal history of unspecified urinary disorder   . Pneumonia   . Routine general medical examination at a health care facility   . Routine gynecological examination   . Tobacco use disorder   . Type II or unspecified type diabetes mellitus without mention of complication, not stated as  uncontrolled   . Unspecified sleep apnea   . Wears glasses     Patient Active Problem List   Diagnosis Date Noted  . Narcotic dependence ([employer]) 09/04/2017  . Leg cramping 09/04/2017  . 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 Bilateral carpal tunnel syndrome 04/16/2017  . Bilateral leg weakness 03/07/2017  . Muscular deconditioning 03/07/2017  . Bilateral numbness and tingling of arms and legs 03/05/2017  . Frequent falls 03/05/2017  . History of fusion of cervical spine 03/05/2017  . History of lumbar fusion 03/05/2017  . Peripheral edema 01/01/2017  . COPD exacerbation ([place]) 10/01/2016  . Neurogenic claudication 04/18/2016  . COPD, moderate ([place]) 03/22/2016  . Counseling regarding end of life decision making 09/13/2015  . Microalbuminuria 05/19/2015  . Myelopathy ([place]) 01/19/2015  . Unspecified constipation 07/10/2013  . Essential hypertension, benign 02/21/2011  . UTI'S, HX OF 12/26/2010  . Chronic back pain 02/22/2009  . Diabetes mellitus with no complication ([place]) 63/78/5885  . ALLERGIC RHINITIS 06/24/2007  . RENAL CALCULUS 06/24/2007  . OSTEOARTHRITIS 06/24/2007  . GAD (generalized anxiety disorder) 04/03/2007  . Hyperlipidemia LDL goal <100 03/12/2007  . TOBACCO ABUSE 03/12/2007  . Major depression, recurrent ([place]) 03/12/2007  . GERD 03/12/2007  . Fibromyalgia 03/12/2007  . SLEEP APNEA 03/12/2007    Past Surgical History:  Procedure Laterality Date  . ANTERIOR CERVICAL DECOMPRESSION FUSION, CERVICAL 4-5 WITH INSTRUMENTATION AND ALLOGRAFT N/A 05/23/2017   Performed by  Phylliss Bob, MD at [facility]  . ANTERIOR CERVICAL DECOMPRESSION/DISCECTOMY FUSION 2 LEVELS N/A 01/19/2015   Performed by Phylliss Bob, MD at [facility]  . BACK SURGERY    . CHOLECYSTECTOMY  08/2006  . COLONOSCOPY    . LEFT SIDED LUMBAR 4-5 TRANSFORAMINAL LUMBAR INTERBODY FUSION WITH INSTRUMENTATION AND ALLOGRAFT Left 04/18/2016   Performed by  Phylliss Bob, MD at [facility]  . TRANSTHORACIC ECHOCARDIOGRAM  02/2011   mild LVH, nl EF, mild diastolic dysfunction, no wall motion abnl    OB History    No data available       Home Medications    Prior to Admission medications   Medication Sig Start Date End Date Taking? Authorizing Provider  ACCU-CHEK AVIVA PLUS test strip CHECK BLOOD SUGAR TWICE DAILY AND AS DIRECTED 11/08/16  Yes Bedsole, Amy E, MD  ACCU-CHEK SOFTCLIX LANCETS lancets CHECK BLOOD SUGAR TWICE DAILY AND AS DIRECTED 11/08/16  Yes Bedsole, Amy E, MD  acetaminophen-codeine (TYLENOL #3) 300-30 MG tablet Take 1 tablet by mouth every 8 (eight) hours as needed for moderate pain. 09/04/17  Yes Jinny Sanders, MD  AIMSCO INSULIN SYR ULTRA THIN 31G X 5/16" 0.3 ML MISC  04/02/15  Yes [provider]  Alcohol Swabs PADS Check blood sugar twice a day and as directed. Dx E11.9 11/03/15  Yes Bedsole, Amy E, MD  ALPRAZolam (XANAX) 0.5 MG tablet Take 1 tablet (0.5 mg total) 3 (three) times daily as needed by mouth. 09/20/17  Yes Bedsole, Amy E, MD  amitriptyline (ELAVIL) 50 MG tablet TAKE 1 TABLET(50 MG) BY MOUTH AT BEDTIME 08/08/17  Yes Bedsole, Amy E, MD  B-D ULTRAFINE III SHORT PEN 31G X 8 MM MISC USE AS DIRECTED WITH INSULIN PEN Patient taking differently: USE DAILY WITH INSULIN PEN AS NEEDED FOR HIGH BLOOD GLUCOSE READINGS 09/18/16  Yes Bedsole, Amy E, MD  Blood Glucose Monitoring Suppl (ACCU-CHEK AVIVA PLUS) w/Device KIT Check blood sugar twice daily and as directed. 02/07/17  Yes Bedsole, Amy E, MD  chlorpheniramine (CHLOR-TRIMETON) 4 MG tablet Take 8 mg by mouth 2 (two) times daily as needed for allergies (sinus congestion).    Yes [provider]  furosemide (LASIX) 20 MG tablet TAKE 1 TABLET(20 MG) BY MOUTH DAILY 05/16/17  Yes Bedsole, Amy E, MD  gabapentin (NEURONTIN) 300 MG capsule TAKE 1 CAPSULE BY MOUTH IN THE MORNING, 1 CAPSULE IN THE AFTERNOON AND 2 CAPSULES AT BEDTIME 09/23/17  Yes Bedsole, Amy E, MD  gemfibrozil  (LOPID) 600 MG tablet TAKE 1 TABLET BY MOUTH TWICE DAILY 05/16/17  Yes Bedsole, Amy E, MD  guaiFENesin (MUCINEX) 600 MG 12 hr tablet Take 600 mg 2 (two) times daily as needed by mouth for cough.    Yes [provider]  Homeopathic Products (ARNICARE ARNICA) CREA Apply 1 application topically as needed (Apply to ankles for pain.).    Yes [provider]  Insulin Glargine (LANTUS SOLOSTAR) 100 UNIT/ML Solostar Pen ADMINISTER 35 UNITS UNDER THE SKIN AT BEDTIME 08/23/17  Yes Bedsole, Amy E, MD  Lancet Devices (ADJUSTABLE LANCING DEVICE) MISC Check blood sugar twice a day and as directed. Dx E11.9 11/03/15  Yes Bedsole, Amy E, MD  metFORMIN (GLUCOPHAGE) 1000 MG tablet TAKE 1 TABLET(1000 MG) BY MOUTH TWICE DAILY WITH A MEAL 05/06/17  Yes Bedsole, Amy E, MD  methocarbamol (ROBAXIN) 500 MG tablet TAKE 2 TABLETS(1000 MG) BY MOUTH AT BEDTIME 09/03/17  Yes Bedsole, Amy E, MD  Phenylephrine-Acetaminophen (  TYLENOL SINUS+HEADACHE PO) Take 2 tablets by mouth 2 (two) times daily.   Yes [provider]  pioglitazone (ACTOS) 45 MG tablet TAKE 1 TABLET BY MOUTH EVERY DAY 03/05/17  Yes Bedsole, Amy E, MD  PROAIR HFA 108 (90 Base) MCG/ACT inhaler INHALE 2 PUFFS BY MOUTH EVERY 4 HOURS AS NEEDED FOR WHEEZING OR SHORTNESS OF BREATH 07/22/17  Yes Bedsole, Amy E, MD  ranitidine (ZANTAC) 150 MG tablet Take 150 mg by mouth 2 (two) times daily.   Yes [provider]  sodium chloride (OCEAN) 0.65 % SOLN nasal spray Place 2 sprays into both nostrils as needed for congestion.   Yes [provider]  SYMBICORT 80-4.5 MCG/ACT inhaler INHALE 2 PUFFS BY MOUTH TWICE DAILY 12/07/16  Yes Bedsole, Amy E, MD  nicotine (NICODERM CQ - DOSED IN MG/24 HOURS) 14 mg/24hr patch Place 1 patch (14 mg total) onto the skin daily. Patient not taking: Reported on 09/24/2017 06/06/17   Jinny Sanders, MD  VICTOZA 18 MG/3ML SOPN ADMINISTER 1.8 MG UNDER THE SKIN DAILY Patient not taking: Reported on 09/24/2017 10/17/15    Jinny Sanders, MD    Family History Family History  Problem Relation Age of Onset  . Stroke Father   . Colon cancer Cousin     Social History Social History   Tobacco Use  . Smoking status: Current Every Day Smoker    Packs/day: 1.00    Years: 35.00    Pack years: 35.00    Types: Cigarettes  . Smokeless tobacco: Never Used  Substance Use Topics  . Alcohol use: No  . Drug use: No     Allergies   Benazepril; Diclofenac sodium; Fluticasone-salmeterol; Nsaids; Penicillins; Aspirin; and Propoxyphene   Review of Systems Review of Systems  Constitutional: Positive for chills. Negative for fever.  Respiratory: Negative for cough, chest tightness and shortness of breath.   Cardiovascular: Negative for chest pain, palpitations and leg swelling.  Gastrointestinal: Positive for diarrhea and nausea. Negative for abdominal distention, blood in stool, constipation and vomiting.  Genitourinary: Negative for dysuria, flank pain, pelvic pain, vaginal bleeding, vaginal discharge and vaginal pain.  Musculoskeletal: Negative for arthralgias, myalgias, neck pain and neck stiffness.  Skin: Negative for rash.  Neurological: Negative for dizziness, weakness and headaches.  All other systems reviewed and are negative.    Physical Exam Updated Vital Signs BP (!) 155/79   Pulse 100   Temp 97.9 F (36.6 C) (Oral)   Resp 20   SpO2 95%   Physical Exam  Constitutional: She appears well-developed and well-nourished. No distress.  HENT:  Head: Normocephalic.  Eyes: Conjunctivae are normal.  Neck: Neck supple.  Cardiovascular: Normal rate, regular rhythm and normal heart sounds.  Pulmonary/Chest: Effort normal and breath sounds normal. No respiratory distress. She has no wheezes. She has no rales.  Abdominal: Soft. Bowel sounds are normal. She exhibits no distension. There is tenderness in the left lower quadrant. There is no rebound and no guarding.  Musculoskeletal: She exhibits no  edema.  Neurological: She is alert.  Skin: Skin is warm and dry.  Psychiatric: She has a normal mood and affect. Her behavior is normal.  Nursing note and vitals reviewed.    ED Treatments / Results  Labs (all labs ordered are listed, but only abnormal results are displayed) Labs Reviewed  COMPREHENSIVE METABOLIC PANEL - Abnormal; Notable for the following components:      Result Value   Potassium 3.3 (*)    CO2 21 (*)  Glucose, Bld 109 (*)    Creatinine, Ser 0.41 (*)    All other components within normal limits  URINALYSIS, ROUTINE W REFLEX MICROSCOPIC - Abnormal; Notable for the following components:   Hgb urine dipstick SMALL (*)    Ketones, ur 20 (*)    Squamous Epithelial / LPF 0-5 (*)    All other components within normal limits  GASTROINTESTINAL PANEL BY PCR, STOOL (REPLACES STOOL CULTURE)  C DIFFICILE QUICK SCREEN W PCR REFLEX  LIPASE, BLOOD  CBC    EKG  EKG Interpretation None       Radiology Ct Abdomen Pelvis W Contrast  Result Date: 09/24/2017 CLINICAL DATA:  Initial evaluation for acute abdominal pain. Diverticulitis suspected. EXAM: CT ABDOMEN AND PELVIS WITH CONTRAST TECHNIQUE: Multidetector CT imaging of the abdomen and pelvis was performed using the standard protocol following bolus administration of intravenous contrast. COMPARISON:  None. FINDINGS: Lower chest: Mild scattered atelectatic changes present within the left lung base. Visualized lungs are otherwise clear. Hepatobiliary: Liver demonstrates a normal contrast enhanced appearance. Gallbladder surgically absent. No biliary dilatation. Pancreas: Pancreas within normal limits. Spleen: Spleen within normal limits. Adrenals/Urinary Tract: Adrenal glands within normal limits. Kidneys equal in size with symmetric enhancement. No nephrolithiasis, hydronephrosis, or focal enhancing renal mass. Small parenchymal calcification noted within the left kidney. No hydroureter. Bladder largely decompressed  without acute abnormality. Stomach/Bowel: Small hiatal hernia noted. Small amount of fluid noted layering within the distal esophageal lumen. Stomach otherwise unremarkable. No evidence for bowel obstruction. Appendix not visualize, compatible with history prior appendectomy. No acute inflammatory changes seen about the bowels. Vascular/Lymphatic: Moderate aorto bi-iliac atherosclerotic disease. No aneurysm. Normal intravascular enhancement seen throughout the intra- abdominal aorta and its branch vessels. No adenopathy. Reproductive: Uterus and ovaries within normal limits. Other: No free air or fluid. Musculoskeletal: No acute osseus abnormality. No worrisome lytic or blastic osseous lesions. Scoliosis with advanced multilevel degenerative spondylolysis at T12-L1 through L2-3. Patient status post PLIF at L4-5. IMPRESSION: 1. No CT evidence for acute intra-abdominal or pelvic process. 2. Status post cholecystectomy and appendectomy. 3. Moderate aorto bi-iliac atherosclerotic disease. Electronically Signed   By: Jeannine Boga M.D.   On: 09/24/2017 05:41    Procedures Procedures (including critical care time)  Medications Ordered in ED Medications  sodium chloride 0.9 % bolus 1,000 mL (1,000 mLs Intravenous New Bag/Given 09/24/17 0504)  ondansetron (ZOFRAN) injection 4 mg (4 mg Intravenous Given 09/24/17 0505)  iopamidol (ISOVUE-300) 61 % injection 100 mL (100 mLs Intravenous Contrast Given 09/24/17 0513)     Initial Impression / Assessment and Plan / ED Course  I have reviewed the triage vital signs and the nursing notes.  Pertinent labs & imaging results that were available during my care of the patient were reviewed by me and considered in my medical decision making (see chart for details).    Patient in the emergency department with generalized abdominal discomfort, nausea, diarrhea.  No vomiting.  Abdomen is soft, left lower quadrant tenderness present.  Normal CBC on the lab work.   Potassium slightly low at 3.3, consistent with diarrhea.  Urine analysis with 20 ketones otherwise negative.  Will get CT abdomen pelvis to rule out colitis.   CT negative.  Patient continues to have some abdominal discomfort.  Zofran and IV fluids ordered.  Will reassess.  Patient was not able to give [place] a stool sample yet.  7:21 AM Patient feels better.  Unable to give [place] a stool sample earlier, will try again now.  Vital signs improved, heart rate is now in the 90s.  Plan to discharge home with antiemetics, Zofran, Bentyl.  Follow-up with family doctor as needed.  Most likely viral gastroenteritis.  Return precautions discussed.  Vitals:   09/24/17 0308 09/24/17 0439 09/24/17 0441 09/24/17 0509  BP: (!) 153/77 (!) 178/77 (!) 178/77 (!) 155/79  Pulse: 100 (!) 109 (!) 106 100  Resp: 18  20   Temp:   97.9 F (36.6 C)   TempSrc:   Oral   SpO2: 94%  95%      Final Clinical Impressions(s) / ED Diagnoses   Final diagnoses:  Diarrhea, unspecified type    ED Discharge Orders    None       Jeannett Senior, PA-C 09/24/17 0732    Palumbo, April, MD 09/24/17 [phone]

## 2017-09-24 NOTE — Discharge Instructions (Signed)
Drink plenty of fluids.  Take Zofran as prescribed as needed for nausea.  Take Bentyl for abdominal cramping.  You can take Imodium for diarrhea if needed.  Follow-up with the family doctor in 2-3 days for recheck.  Return if worsening symptoms.

## 2017-09-25 ENCOUNTER — Ambulatory Visit: Payer: Medicare Other | Admitting: Family Medicine

## 2017-09-30 ENCOUNTER — Telehealth: Payer: Self-pay

## 2017-09-30 ENCOUNTER — Other Ambulatory Visit: Payer: Self-pay | Admitting: Family Medicine

## 2017-09-30 NOTE — Telephone Encounter (Signed)
Chronic neck pain:    Recent neck surgery ( 05/23/2017)... She has been discharged from [facility] care as completed.   Medication and dose: Tylenol # 3 300/30 mg Currently using 1 tablet twice daily. # pills per month:  Plan 60 monthly Last UDS date: 09/24/2016 Pain contract signed (Y/N): 09/24/2016 Date narcotic database last reviewed (include red flags): 08/30/2017, no red flags   Has appt for 3 month follow up in January.  Refilled x  1 month supply

## 2017-09-30 NOTE — Telephone Encounter (Signed)
Last refill 09/03/17 #90 Last office visit 09/09/17-acute

## 2017-09-30 NOTE — Telephone Encounter (Signed)
Last office visit 09/09/2017 with Dr. Lorelei Pont,  Last refilled 09/04/2017 for #90 with no refills.  Ok to refill?

## 2017-09-30 NOTE — Telephone Encounter (Signed)
Tylenol #3 called into [id number] - [place], [place] - [place]

## 2017-09-30 NOTE — Telephone Encounter (Signed)
Requesting refill of Robaxin. Copied from [facility] [id number]. Topic: Quick Communication - See Telephone Encounter >> Sep 30, 2017  1:30 PM Arletha Grippe wrote: CRM for notification. See Telephone encounter for:   09/30/17. Pt would like refill on methocarbamol (ROBAXIN) 500 MG tablet pt [id number] - [place], [place] - [address] AT [place] Pt cb number is [phone]

## 2017-10-01 ENCOUNTER — Other Ambulatory Visit: Payer: Self-pay | Admitting: Family Medicine

## 2017-10-01 ENCOUNTER — Telehealth: Payer: Self-pay | Admitting: Family Medicine

## 2017-10-01 MED ORDER — METHOCARBAMOL 500 MG PO TABS: ORAL_TABLET | ORAL | 0 refills | Status: DC

## 2017-10-01 NOTE — Addendum Note (Signed)
Addended by: Carter Kitten on: 10/01/2017 02:31 PM   Modules accepted: Orders

## 2017-10-01 NOTE — Telephone Encounter (Signed)
I spoke with Chrys Racer and pt said she wants # 30 more of the methocarbamol 500 mg. Pt said last month she got # 60 and this month was given # 30 by the pharmacy. When pt takes the # 30 for this month she will cb and request # 60 rather than # 90. Nothing further needed at this time.

## 2017-10-01 NOTE — Addendum Note (Signed)
Addended by: Carter Kitten on: 10/01/2017 10:22 AM   Modules accepted: Orders

## 2017-10-01 NOTE — Telephone Encounter (Signed)
Okay to refill.. I cannot refill easily in this format.

## 2017-10-01 NOTE — Telephone Encounter (Signed)
Refill sent as instructed by Dr. Bedsole. 

## 2017-10-01 NOTE — Telephone Encounter (Signed)
Copied from [place]. Topic: Quick Communication - See Telephone Encounter >> Oct 01, 2017 12:57 PM Conception Chancy, NT wrote: CRM for notification. See Telephone encounter for:  10/01/17.   Pt states she always gets 60 tablets and they only refilled 30 tablets she is wanting the other 30 be called in.,

## 2017-10-02 MED ORDER — METHOCARBAMOL 500 MG PO TABS: ORAL_TABLET | ORAL | 0 refills | Status: DC

## 2017-10-02 NOTE — Telephone Encounter (Signed)
Received fax stating patient is requesting #60 sent in for Methocarbamol.  She takes 2 tablets at bedtime.  Refill sent for #60.

## 2017-10-02 NOTE — Addendum Note (Signed)
Addended by: Carter Kitten on: 10/02/2017 09:56 AM   Modules accepted: Orders

## 2017-10-14 ENCOUNTER — Other Ambulatory Visit: Payer: Self-pay | Admitting: Family Medicine

## 2017-10-19 ENCOUNTER — Other Ambulatory Visit: Payer: Self-pay | Admitting: Family Medicine

## 2017-10-20 ENCOUNTER — Other Ambulatory Visit: Payer: Self-pay | Admitting: Family Medicine

## 2017-10-21 ENCOUNTER — Telehealth: Payer: Self-pay | Admitting: Family Medicine

## 2017-10-21 NOTE — Telephone Encounter (Signed)
Last filled 09-23-17 #90 Last OV 09-09-17 Next OV 11-28-17

## 2017-10-21 NOTE — Telephone Encounter (Signed)
Rx called to pharmacy as instructed. 

## 2017-10-21 NOTE — Telephone Encounter (Signed)
Copied from [facility] [id number]. Topic: Quick Communication - See Telephone Encounter >> Oct 21, 2017 12:38 PM Cleaster Corin, NT wrote: CRM for notification. See Telephone encounter for:   10/21/17. Pt calling to get refill on meds (xanax and Gabapentin) pt. Has already called pharmacy   [id number] [place], [place] [address] [place] [address] Phone: [phone] Fax: [phone] Open 24 hours

## 2017-10-21 NOTE — Telephone Encounter (Signed)
Pt. notified of refills being done for Xanax and Gabapentin.  Verb. Understanding.

## 2017-10-27 ENCOUNTER — Other Ambulatory Visit: Payer: Self-pay | Admitting: Family Medicine

## 2017-10-28 ENCOUNTER — Other Ambulatory Visit: Payer: Self-pay | Admitting: Family Medicine

## 2017-10-28 NOTE — Telephone Encounter (Signed)
Last office visit 09/09/2017 with Dr. Lorelei Pont.  Last refilled 10/02/2017 for #60 with no refills.  Ok to refill?

## 2017-10-29 ENCOUNTER — Other Ambulatory Visit: Payer: Self-pay | Admitting: Family Medicine

## 2017-10-30 ENCOUNTER — Other Ambulatory Visit: Payer: Self-pay | Admitting: Family Medicine

## 2017-10-30 NOTE — Telephone Encounter (Signed)
Last office visit 09/09/2017 with Dr. Lorelei Pont.  Last refilled 09/30/2017 for #60 with no refills.  Ok to refill?

## 2017-10-30 NOTE — Telephone Encounter (Signed)
Request for controlled substance 

## 2017-10-30 NOTE — Telephone Encounter (Signed)
Copied from [place]. Topic: Quick Communication - Rx Refill/Question >> Oct 30, 2017 11:34 AM Arletha Grippe wrote: Has the patient contacted their pharmacy? Yes.     (Agent: If no, request that the patient contact the pharmacy for the refill.)   Preferred Pharmacy (with phone number [facility] street name[facility]  - needs refill on tylenol #3 and methocarbamol 500 mg refilled.  Pt cb# is [phone]. Pt called pharmacy, wasn't sure they called [place]     Agent: Please be advised that RX refills may take up to 3 business days. We ask that you follow-up with your pharmacy.

## 2017-11-01 MED ORDER — METHOCARBAMOL 500 MG PO TABS: ORAL_TABLET | ORAL | 0 refills | Status: DC

## 2017-11-01 NOTE — Telephone Encounter (Signed)
Ok to refill robaxin #90, 0 ref  Hold on tylenol #3 for pcp approval

## 2017-11-01 NOTE — Telephone Encounter (Signed)
Pt calling about refill

## 2017-11-01 NOTE — Telephone Encounter (Signed)
Sandra Ferrell notified by telephone that her refills have been sent to her pharmacy.

## 2017-11-01 NOTE — Telephone Encounter (Signed)
ok 

## 2017-11-01 NOTE — Telephone Encounter (Signed)
Pt calling about med refill and would like a call on an update. Doesn't want to go the weekend with out meds.

## 2017-11-01 NOTE — Telephone Encounter (Signed)
Tyl[facility]ol #[id number] [place], [place] - [place]

## 2017-11-11 ENCOUNTER — Encounter: Payer: Self-pay | Admitting: Family Medicine

## 2017-11-11 ENCOUNTER — Other Ambulatory Visit: Payer: Self-pay

## 2017-11-11 ENCOUNTER — Ambulatory Visit (INDEPENDENT_AMBULATORY_CARE_PROVIDER_SITE_OTHER): Payer: Medicare Other | Admitting: Family Medicine

## 2017-11-11 VITALS — BP 104/60 | HR 79 | Temp 98.5°F | Ht <= 58 in | Wt 222.8 lb

## 2017-11-11 DIAGNOSIS — M7502 Adhesive capsulitis of left shoulder: Secondary | ICD-10-CM | POA: Diagnosis not present

## 2017-11-11 DIAGNOSIS — G5621 Lesion of ulnar nerve, right upper limb: Secondary | ICD-10-CM | POA: Diagnosis not present

## 2017-11-11 DIAGNOSIS — E11618 Type 2 diabetes mellitus with other diabetic arthropathy: Secondary | ICD-10-CM

## 2017-11-11 DIAGNOSIS — G5603 Carpal tunnel syndrome, bilateral upper limbs: Secondary | ICD-10-CM | POA: Diagnosis not present

## 2017-11-11 NOTE — Progress Notes (Signed)
Dr. Frederico Hamman T. Wilfred Dayrit, MD, [facility] [place], [address] Phone: [phone] Fax: [phone]  11/11/2017  Patient: Sandra Ferrell, MRN: [id number], DOB: 1961-10-12, 57 y.o.  Primary Physician:  Sandra Sanders, MD   Chief Complaint  Patient presents with  . Follow-up    Left Shoulder  . Shoulder Pain    Now right & elbow shoulder is hurting   Subjective:   Sandra Ferrell is a 57 y.o. very pleasant female patient who presents with the following:  Sandra Ferrell is primarily here in follow-up regarding her left-sided adhesive capsulitis, last seen on September 09, 2017, and at that point I gave her some home exercises as well as did an intra-articular injection.  She has done well since then and has had some improvement in her range of motion and decreased pain on the left side.  She has been diligent with doing her rehab exercises at home.  Frozen shoulder, L and now ? Right?  She and her husband, who also provides a significant amount of history, have questions regarding her bilateral carpal tunnel syndrome as well as bilateral ulnar neuropathy, she has been seeing Dr. Grandville Ferrell.  They are contemplating having surgery, and they wanted to get a second opinion.  L shoulder hurting and also has some upcoming B cartal tunnel syndrome. ? Ulnar neuropathy.  Had EMG and NCV. They would like a second opinion about CTS and R cubital tunnel.   Past Medical History, Surgical History, Social History, Family History, Problem List, Medications, and Allergies have been reviewed and updated if relevant.  Patient Active Problem List   Diagnosis Date Noted  . Narcotic dependence ([place]) 09/04/2017  . Leg cramping 09/04/2017  . 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 Bilateral carpal tunnel syndrome 04/16/2017  . Bilateral leg weakness 03/07/2017  . Muscular deconditioning 03/07/2017  . Bilateral  numbness and tingling of arms and legs 03/05/2017  . Frequent falls 03/05/2017  . History of fusion of cervical spine 03/05/2017  . History of lumbar fusion 03/05/2017  . Peripheral edema 01/01/2017  . COPD exacerbation ([place]) 10/01/2016  . Neurogenic claudication 04/18/2016  . COPD, moderate ([place]) 03/22/2016  . Counseling regarding end of life decision making 09/13/2015  . Microalbuminuria 05/19/2015  . Myelopathy ([place]) 01/19/2015  . Unspecified constipation 07/10/2013  . Essential hypertension, benign 02/21/2011  . UTI'S, HX OF 12/26/2010  . Chronic back pain 02/22/2009  . Diabetes mellitus with no complication ([place]) 70/17/7939  . ALLERGIC RHINITIS 06/24/2007  . RENAL CALCULUS 06/24/2007  . OSTEOARTHRITIS 06/24/2007  . GAD (generalized anxiety disorder) 04/03/2007  . Hyperlipidemia LDL goal <100 03/12/2007  . TOBACCO ABUSE 03/12/2007  . Major depression, recurrent ([place]) 03/12/2007  . GERD 03/12/2007  . Fibromyalgia 03/12/2007  . SLEEP APNEA 03/12/2007    Past Medical History:  Diagnosis Date  . Allergic rhinitis, cause unspecified   . Anxiety state, unspecified   . Backache, unspecified   . Calculus of kidney   . Carpal tunnel syndrome, right   . Cough   . Depressive disorder, not elsewhere classified    managed with medications  . Esophageal reflux   . Extrinsic asthma with exacerbation   . Fibromyalgia   . Heart murmur   . Hypertension   . Osteoarthrosis, unspecified whether generalized or localized, unspecified site   . Other and unspecified hyperlipidemia   . Other screening mammogram   . Personal history of  unspecified urinary disorder   . Pneumonia   . Routine general medical examination at a health care facility   . Routine gynecological examination   . Tobacco use disorder   . Type II or unspecified type diabetes mellitus without mention of complication, not stated as uncontrolled   . Unspecified sleep apnea   . Wears glasses     Past Surgical History:   Procedure Laterality Date  . ANTERIOR CERVICAL DECOMP/DISCECTOMY FUSION N/A 01/19/2015   Procedure: ANTERIOR CERVICAL DECOMPRESSION/DISCECTOMY FUSION 2 LEVELS;  Surgeon: Phylliss Bob, MD;  Location: [facility];  Service: Orthopedics;  Laterality: N/A;  Anterior cervical decompression fusion, cervical 5-6, cervical 6-7 with instrumentation and allograft  . ANTERIOR CERVICAL DECOMP/DISCECTOMY FUSION N/A 05/23/2017   Procedure: ANTERIOR CERVICAL DECOMPRESSION FUSION, CERVICAL 4-5 WITH INSTRUMENTATION AND ALLOGRAFT;  Surgeon: Phylliss Bob, MD;  Location: [facility];  Service: Orthopedics;  Laterality: N/A;  ANTERIOR CERVICAL DECOMPRESSION FUSION, CERVICAL 4-5 WITH INSTRUMENTATION AND ALLOGRAFT; REQUEST 2 HOURS AND FLIP ROOM  . APPENDECTOMY  1973  . BACK SURGERY    . CHOLECYSTECTOMY  08/2006  . COLONOSCOPY    . TRANSTHORACIC ECHOCARDIOGRAM  02/2011   mild LVH, nl EF, mild diastolic dysfunction, no wall motion abnl    Social History   Socioeconomic History  . Marital status: Married    Spouse name: Not on file  . Number of children: Not on file  . Years of education: Not on file  . Highest education level: Not on file  Social Needs  . Financial resource strain: Not on file  . Food insecurity - worry: Not on file  . Food insecurity - inability: Not on file  . Transportation needs - medical: Not on file  . Transportation needs - non-medical: Not on file  Occupational History  . Occupation: Housewife  Tobacco Use  . Smoking status: Current Every Day Smoker    Packs/day: 1.00    Years: 35.00    Pack years: 35.00    Types: Cigarettes  . Smokeless tobacco: Never Used  Substance and Sexual Activity  . Alcohol use: No  . Drug use: No  . Sexual activity: No  Other Topics Concern  . Not on file  Social History Narrative   Moderate diet.    No living will, no HCPOA.    Family History  Problem Relation Age of Onset  . Stroke Father   . Colon cancer Cousin     Allergies  Allergen Reactions   . Benazepril Anaphylaxis, Swelling and Other (See Comments)    Angioedema, throat swelling  . Diclofenac Sodium Other (See Comments)    GI bleed  . Fluticasone-Salmeterol Hives, Itching and Swelling    Tongue was swollen  . Nsaids Other (See Comments)    Rectal bleeding  . Penicillins Hives and Rash    Has patient had a PCN reaction causing immediate rash, facial/tongue/throat swelling, SOB or lightheadedness with hypotension: no Has patient had a PCN reaction causing severe rash involving mucus membranes or skin necrosis: no Has patient had a PCN reaction that required hospitalization no Has patient had a PCN reaction occurring within the last 10 years: no If all of the above answers are "NO", then may proceed with Cephalosporin use.   . Aspirin Other (See Comments)    Burns stomach  . Propoxyphene Other (See Comments)    Darvocet - Headache    Medication list reviewed and updated in full in [place].  GEN: No fevers, chills. Nontoxic. Primarily MSK c/o  today. MSK: Detailed in the HPI GI: tolerating PO intake without difficulty Neuro: No numbness, parasthesias, or tingling associated. Otherwise the pertinent positives of the ROS are noted above.   Objective:   BP 104/60   Pulse 79   Temp 98.5 F (36.9 C) (Oral)   Ht _0  (1.473 m)   Wt 222 lb 12 oz (101 kg)   BMI 46.55 kg/m    GEN: WDWN, NAD, Non-toxic, Alert & Oriented x 3 HEENT: Atraumatic, Normocephalic.  Ears and Nose: No external deformity. EXTR: No clubbing/cyanosis/edema NEURO: Normal gait.  PSYCH: Normally interactive. Conversant. Not depressed or anxious appearing.  Calm demeanor.   Shoulder: R and L Inspection: No muscle wasting or winging Ecchymosis/edema: neg  AC joint, scapula, clavicle: NT Cervical spine: NT, full ROM Spurling's: neg ABNORMAL SIDE TESTED: L UNLESS OTHERWISE NOTED, THE CONTRALATERAL SIDE HAS FULL RANGE OF MOTION. Abduction: 5/5, LIMITED TO 125 DEGREES Flexion: 5/5,  LIMITED TO 140 DEGNO ROM  IR, lift-off: 5/5. TESTED AT 90 DEGREES OF ABDUCTION, LIMITED TO 10 DEGREES ER at neutral:  5/5, TESTED AT 90 DEGREES OF ABDUCTION, LIMITED TO 65 DEGREES AC crossover and compression: PAIN Drop Test: neg Empty Can: neg Supraspinatus insertion: NT Bicipital groove: NT ALL OTHER SPECIAL TESTING EQUIVOCAL GIVEN LOSS OF MOTION C5-T1 intact Sensation intact Grip 5/5    Radiology: No results found.  Assessment and Plan:   Adhesive capsulitis of left shoulder associated with type 2 diabetes mellitus (HCC)  Bilateral carpal tunnel syndrome - Plan: Ambulatory referral to Hand Surgery  Ulnar neuropathy at elbow, right - Plan: Ambulatory referral to Hand Surgery   >25 minutes spent in face to face time with patient, >50% spent in counselling or coordination of care: Additional time spent in discussing the patient's carpal tunnel syndrome as well as ulnar neuropathy.  Additional time spent in redirection given conversation and interruption with the patient's husband.  She is doing very well and making good progress with her frozen shoulder.  Encouraged her to keep up doing her home exercise program, and I will recheck her in 2 months time.  They asked for my recommendation regarding a second opinion from a hand surgeon, so I recommended that she see either Dr. Amedeo Plenty or Dr. Caralyn Guile for their opinion.  Follow-up: Return in about 2 months (around 01/09/2018).  Orders Placed This Encounter  Procedures  . Ambulatory referral to Hand Surgery    Signed,  Frederico Hamman T. Mayda Shippee, MD   Allergies as of 11/11/2017      Reactions   Benazepril Anaphylaxis, Swelling, Other (See Comments)   Angioedema, throat swelling   Diclofenac Sodium Other (See Comments)   GI bleed   Fluticasone-salmeterol Hives, Itching, Swelling   Tongue was swollen   Nsaids Other (See Comments)   Rectal bleeding   Penicillins Hives, Rash   Has patient had a PCN reaction causing immediate rash,  facial/tongue/throat swelling, SOB or lightheadedness with hypotension: no Has patient had a PCN reaction causing severe rash involving mucus membranes or skin necrosis: no Has patient had a PCN reaction that required hospitalization no Has patient had a PCN reaction occurring within the last 10 years: no If all of the above answers are "NO", then may proceed with Cephalosporin use.   Aspirin Other (See Comments)   Burns stomach   Propoxyphene Other (See Comments)   Darvocet - Headache      Medication List        Accurate as of 11/11/17  1:28 PM. Always use  your most recent med list.          ACCU-CHEK AVIVA PLUS test strip Generic drug:  glucose blood CHECK BLOOD SUGAR TWICE DAILY AND AS DIRECTED   ACCU-CHEK AVIVA PLUS w/Device Kit Check blood sugar twice daily and as directed.   ACCU-CHEK SOFTCLIX LANCETS lancets CHECK BLOOD SUGAR TWICE DAILY AND AS DIRECTED   acetaminophen-codeine 300-30 MG tablet Commonly known as:  TYLENOL #3 TAKE 1 TABLET BY MOUTH EVERY 12 HOURS AS NEEDED FOR PAIN   Adjustable Lancing Device Misc Check blood sugar twice a day and as directed. Dx E11.9   AIMSCO INSULIN SYR ULTRA THIN 31G X 5/16" 0.3 ML Misc Generic drug:  Insulin Syringe-Needle U-100   Alcohol Swabs Pads Check blood sugar twice a day and as directed. Dx E11.9   ALPRAZolam 0.5 MG tablet Commonly known as:  XANAX TAKE 1 TABLET BY MOUTH 3 TIMES A DAY AS NEEDED   amitriptyline 50 MG tablet Commonly known as:  ELAVIL TAKE 1 TABLET(50 MG) BY MOUTH AT BEDTIME   ARNICARE ARNICA Crea Apply 1 application topically as needed (Apply to ankles for pain.).   B-D ULTRAFINE III SHORT PEN 31G X 8 MM Misc Generic drug:  Insulin Pen Needle USE AS DIRECTED WITH INSULIN PEN   chlorpheniramine 4 MG tablet Commonly known as:  CHLOR-TRIMETON Take 8 mg by mouth 2 (two) times daily as needed for allergies (sinus congestion).   dicyclomine 20 MG tablet Commonly known as:  BENTYL Take 1 tablet  (20 mg total) 2 (two) times daily by mouth.   furosemide 20 MG tablet Commonly known as:  LASIX TAKE 1 TABLET BY MOUTH EVERY DAY   gabapentin 300 MG capsule Commonly known as:  NEURONTIN TAKE 1 CAPSULE BY MOUTH IN THE MORNING, 1 CAPSULE IN THE AFTERNOON AND 2 CAPSULES AT BEDTIME   gemfibrozil 600 MG tablet Commonly known as:  LOPID TAKE 1 TABLET BY MOUTH TWICE DAILY   guaiFENesin 600 MG 12 hr tablet Commonly known as:  MUCINEX Take 600 mg 2 (two) times daily as needed by mouth for cough.   Insulin Glargine 100 UNIT/ML Solostar Pen Commonly known as:  LANTUS SOLOSTAR ADMINISTER 35 UNITS UNDER THE SKIN AT BEDTIME   metFORMIN 1000 MG tablet Commonly known as:  GLUCOPHAGE TAKE 1 TABLET BY MOUTH TWICE A DAY WITH MEAL   methocarbamol 500 MG tablet Commonly known as:  ROBAXIN TAKE 2 TABLETS BY MOUTH AT BEDTIME   nicotine 14 mg/24hr patch Commonly known as:  NICODERM CQ - dosed in mg/24 hours Place 1 patch (14 mg total) onto the skin daily.   ondansetron 8 MG disintegrating tablet Commonly known as:  ZOFRAN ODT Take 1 tablet (8 mg total) every 8 (eight) hours as needed by mouth for nausea or vomiting.   pioglitazone 45 MG tablet Commonly known as:  ACTOS TAKE 1 TABLET BY MOUTH EVERY DAY   PROAIR HFA 108 (90 Base) MCG/ACT inhaler Generic drug:  albuterol INHALE 2 PUFFS BY MOUTH EVERY 4 HOURS AS NEEDED FOR WHEEZING OR SHORTNESS OF BREATH   ranitidine 150 MG tablet Commonly known as:  ZANTAC Take 150 mg by mouth 2 (two) times daily.   sodium chloride 0.65 % Soln nasal spray Commonly known as:  OCEAN Place 2 sprays into both nostrils as needed for congestion.   SYMBICORT 80-4.5 MCG/ACT inhaler Generic drug:  budesonide-formoterol INHALE 2 PUFFS BY MOUTH TWICE DAILY   TYLENOL SINUS+HEADACHE PO Take 2 tablets by mouth 2 (two) times daily.  VICTOZA 18 MG/3ML Sopn Generic drug:  liraglutide ADMINISTER 1.8 MG UNDER THE SKIN DAILY

## 2017-11-18 ENCOUNTER — Other Ambulatory Visit: Payer: Self-pay | Admitting: Family Medicine

## 2017-11-18 NOTE — Telephone Encounter (Signed)
Last office visit 11/11/2017 with Dr. Lorelei Pont.  Last refilled 10/21/2017 for #120 with no refills.  Ok to refill?

## 2017-11-19 ENCOUNTER — Telehealth: Payer: Self-pay | Admitting: Family Medicine

## 2017-11-19 DIAGNOSIS — E119 Type 2 diabetes mellitus without complications: Secondary | ICD-10-CM

## 2017-11-19 DIAGNOSIS — E785 Hyperlipidemia, unspecified: Secondary | ICD-10-CM

## 2017-11-19 NOTE — Telephone Encounter (Signed)
-----   Message from Ellamae Sia sent at 11/13/2017  6:33 PM EST ----- Regarding: Lab orders for Thursday, 1.17.19 Lab orders for a 3 month follow up appt.

## 2017-11-21 ENCOUNTER — Other Ambulatory Visit: Payer: Medicare Other

## 2017-11-22 ENCOUNTER — Other Ambulatory Visit: Payer: Self-pay | Admitting: Family Medicine

## 2017-11-22 NOTE — Telephone Encounter (Signed)
Last office visit 11/11/2017 with Dr. Lorelei Pont.  Last refilled 10/21/2017 for #90 with no refills.  Ok to refill?

## 2017-11-25 ENCOUNTER — Other Ambulatory Visit: Payer: Medicare Other

## 2017-11-26 ENCOUNTER — Other Ambulatory Visit: Payer: Medicare Other

## 2017-11-26 ENCOUNTER — Other Ambulatory Visit: Payer: Self-pay | Admitting: Family Medicine

## 2017-11-27 ENCOUNTER — Other Ambulatory Visit: Payer: Self-pay | Admitting: Family Medicine

## 2017-11-28 ENCOUNTER — Ambulatory Visit: Payer: Medicare Other | Admitting: Family Medicine

## 2017-11-28 ENCOUNTER — Ambulatory Visit: Payer: Self-pay

## 2017-11-28 NOTE — Telephone Encounter (Signed)
Pt cancel appt.. Feeling too bad. Call pt recommend she be seen today or tommorow.

## 2017-11-28 NOTE — Telephone Encounter (Signed)
  Reason for Disposition . Cough  Answer Assessment - Initial Assessment Questions 1. ONSET: "When did the cough begin?"      Started 2 weeks ago 2. SEVERITY: "How bad is the cough today?"      Moderate 3. RESPIRATORY DISTRESS: "Describe your breathing."      No distress 4. FEVER: "Do you have a fever?" If so, ask: "What is your temperature, how was it measured, and when did it start?"     No 5. SPUTUM: "Describe the color of your sputum" (clear, white, yellow, green)     White 6. HEMOPTYSIS: "Are you coughing up any blood?" If so ask: "How much?" (flecks, streaks, tablespoons, etc.)     No 7. CARDIAC HISTORY: "Do you have any history of heart disease?" (e.g., heart attack, congestive heart failure)      No 8. LUNG HISTORY: "Do you have any history of lung disease?"  (e.g., pulmonary embolus, asthma, emphysema)     COPD 9. PE RISK FACTORS: "Do you have a history of blood clots?" (or: recent major surgery, recent prolonged travel, bedridden )     nO 10. OTHER SYMPTOMS: "Do you have any other symptoms?" (e.g., runny nose, wheezing, chest pain)       [place] 11. PREGNANCY: "Is there any chance you are pregnant?" "When was your last menstrual period?"       No 12. TRAVEL: "Have you traveled out of the country in the last month?" (e.g., travel history, exposures)       No  Protocols used: [place]

## 2017-11-28 NOTE — Telephone Encounter (Signed)
Appointment scheduled with Gentry Fitz on 11/29/2017 at 2:00 pm.

## 2017-11-29 ENCOUNTER — Ambulatory Visit: Payer: Medicare Other | Admitting: Primary Care

## 2017-11-29 DIAGNOSIS — Z0289 Encounter for other administrative examinations: Secondary | ICD-10-CM

## 2017-12-01 ENCOUNTER — Other Ambulatory Visit: Payer: Self-pay | Admitting: Family Medicine

## 2017-12-02 NOTE — Telephone Encounter (Signed)
Last office visit 11/11/2017 with Dr. Lorelei Pont.  Last refilled 11/01/2017 for #60 with no refills.  Ok to refill?

## 2017-12-03 ENCOUNTER — Encounter: Payer: Self-pay | Admitting: Family Medicine

## 2017-12-03 ENCOUNTER — Other Ambulatory Visit: Payer: Self-pay

## 2017-12-03 ENCOUNTER — Ambulatory Visit (INDEPENDENT_AMBULATORY_CARE_PROVIDER_SITE_OTHER): Payer: Medicare Other | Admitting: Family Medicine

## 2017-12-03 VITALS — BP 106/60 | HR 92 | Temp 98.6°F | Resp 93 | Ht <= 58 in | Wt 213.8 lb

## 2017-12-03 DIAGNOSIS — M549 Dorsalgia, unspecified: Secondary | ICD-10-CM | POA: Diagnosis not present

## 2017-12-03 DIAGNOSIS — F1120 Opioid dependence, uncomplicated: Secondary | ICD-10-CM

## 2017-12-03 DIAGNOSIS — B349 Viral infection, unspecified: Secondary | ICD-10-CM

## 2017-12-03 DIAGNOSIS — M5412 Radiculopathy, cervical region: Secondary | ICD-10-CM

## 2017-12-03 DIAGNOSIS — G8929 Other chronic pain: Secondary | ICD-10-CM

## 2017-12-03 DIAGNOSIS — M4712 Other spondylosis with myelopathy, cervical region: Secondary | ICD-10-CM | POA: Diagnosis not present

## 2017-12-03 MED ORDER — ACETAMINOPHEN-CODEINE #3 300-30 MG PO TABS: 1.0000 | ORAL_TABLET | Freq: Two times a day (BID) | ORAL | 0 refills | Status: DC | PRN

## 2017-12-03 NOTE — Patient Instructions (Addendum)
Please stop at the lab to have labs drawn.  Start home stretching in low back.  Can use lidocaine patches on low back.  Try to wean off as much as possible.  Move follow up back so it is in 3 months from now with labs prior.

## 2017-12-03 NOTE — Assessment & Plan Note (Addendum)
Recent neck surgery ( 05/23/2017)... She has been discharged from [facility] care as completed.   Medication and dose: Tylenol # 3 300/30 mg Currently using 1 tablet twice daily. # pills per month:  Plan 60 monthly Last UDS date: 09/24/2016.. Due now, Completed today Pain contract signed (Y/N): 09/24/2016 due now. Completed today Date narcotic database last reviewed (include red flags): 12/03/2017,  Red flags: pt has multiple pharmacies ( recently changed and will remain there at [facility]) and prescribers ( other MD was [facility] with recent surgery.. Now will only get here.  She is high risk for sedation given her concomitant use of benzodiazepine. Counseled pt to try to wean off this medication.  Refilled tylenol #3 x 3 months. Marland Kitchen

## 2017-12-03 NOTE — Assessment & Plan Note (Signed)
Recovering. Rest fluids, increase mobility as lying around on couch likely increasing  Low back pain.

## 2017-12-03 NOTE — Telephone Encounter (Signed)
Sandra Ferrell will be here today at 3:45 pm to see Dr. [name].

## 2017-12-03 NOTE — Progress Notes (Signed)
Subjective:    Patient ID: Sandra Ferrell, female    DOB: Jan 30, 1961, 57 y.o.   MRN: [id number]  HPI    57 year old female presents for follow up chronic pain management.  She has been feeling ill lately.  Cough has improved, diarrhea has resolved.  Fatigued, bodyaches. Family ill as well in last 2 weeks.  Drinking gatorade and water.  Using mucinex and robitussin. She has been having an increase in low back pain.  No dysuria.  Steroid injection and home PT improved her frozen shoulder.  Chronic neck pain:    Recent neck surgery ( 05/23/2017)... She has been discharged from [facility] care as completed.    Medication and dose: Tylenol # 3, 300/30 mg Currently using 1 tablet twice daily. # pills per month:  Plan 60 monthly Last UDS date: 09/24/2016.. Due now Pain contract signed (Y/N): 09/24/2016 due now. Date narcotic database last reviewed (include red flags): 12/03/2017,  Red flags: pt has multiple pharmacies (recently changed and will remain there at [facility]) and prescribers ( other MD was [facility] with recent surgery.. Now will only get here.  She is high risk for sedation given her concomitant use of benzodiazepine. Counseled pt to try to wean off this medication. .    Review of Systems  Constitutional: Negative for fatigue and fever.  HENT: Negative for congestion and ear pain.   Eyes: Negative for pain.  Respiratory: Positive for cough. Negative for chest tightness and shortness of breath.   Cardiovascular: Negative for chest pain, palpitations and leg swelling.  Gastrointestinal: Negative for abdominal pain.  Genitourinary: Negative for dysuria and vaginal bleeding.  Musculoskeletal: Positive for back pain and myalgias.  Neurological: Negative for syncope, light-headedness and headaches.  Psychiatric/Behavioral: Negative for dysphoric mood.       Objective:   Physical Exam  Constitutional: She is oriented to person, place, and time. Vital signs are normal. She appears  well-developed and well-nourished. She is cooperative.  Non-toxic appearance. She does not appear ill. No distress.  obese  HENT:  Head: Normocephalic.  Right Ear: Hearing, tympanic membrane, external ear and ear canal normal. Tympanic membrane is not erythematous, not retracted and not bulging.  Left Ear: Hearing, tympanic membrane, external ear and ear canal normal. Tympanic membrane is not erythematous, not retracted and not bulging.  Nose: No mucosal edema or rhinorrhea. Right sinus exhibits no maxillary sinus tenderness and no frontal sinus tenderness. Left sinus exhibits no maxillary sinus tenderness and no frontal sinus tenderness.  Mouth/Throat: Uvula is midline, oropharynx is clear and moist and mucous membranes are normal.  Eyes: Conjunctivae, EOM and lids are normal. Pupils are equal, round, and reactive to light. Lids are everted and swept, no foreign bodies found.  Neck: Trachea normal and normal range of motion. Neck supple. Carotid bruit is not present. No thyroid mass and no thyromegaly present.  Cardiovascular: Normal rate, regular rhythm, S1 normal, S2 normal, normal heart sounds, intact distal pulses and normal pulses. Exam reveals no gallop and no friction rub.  No murmur heard. Pulmonary/Chest: Effort normal and breath sounds normal. No tachypnea. No respiratory distress. She has no decreased breath sounds. She has no wheezes. She has no rhonchi. She has no rales.  Abdominal: Soft. Normal appearance and bowel sounds are normal. There is no tenderness.  Musculoskeletal:       Lumbar back: She exhibits decreased range of motion and tenderness.  Neurological: She is alert and oriented to person, place, and time. She has  normal reflexes. No cranial nerve deficit or sensory deficit. She exhibits abnormal muscle tone. She displays a negative Romberg sign. Gait abnormal. Coordination normal. GCS eye subscore is 4. GCS verbal subscore is 5. GCS motor subscore is 6.  Nml cerebellar  exam   No papilledema   weakness in bilateral lower extremities.  Skin: Skin is warm, dry and intact. No rash noted.  Psychiatric: She has a normal mood and affect. Her speech is normal and behavior is normal. Judgment and thought content normal. Her mood appears not anxious. Cognition and memory are normal. Cognition and memory are not impaired. She does not exhibit a depressed mood. She exhibits normal recent memory and normal remote memory.          Assessment & Plan:

## 2017-12-03 NOTE — Telephone Encounter (Signed)
She missed OV for chronic pain... I did not realize when I said for her to reschedule in 01/2018.Marland Kitchen She needs appt now.   I can work her in this afternoon if she can come.

## 2017-12-04 ENCOUNTER — Other Ambulatory Visit: Payer: Self-pay | Admitting: Family Medicine

## 2017-12-05 ENCOUNTER — Ambulatory Visit: Payer: Medicare Other | Admitting: Primary Care

## 2017-12-06 LAB — PAIN MGMT, PROFILE 8 W/CONF, U
6 Acetylmorphine: NEGATIVE ng/mL (ref <10)
ALPHAHYDROXYALPRAZOLAM: 788 ng/mL — AB (ref <25)
ALPHAHYDROXYTRIAZOLAM: NEGATIVE ng/mL (ref <50)
Alcohol Metabolites: NEGATIVE ng/mL (ref <500)
Alphahydroxymidazolam: NEGATIVE ng/mL (ref <50)
Aminoclonazepam: NEGATIVE ng/mL (ref <25)
Amphetamines: NEGATIVE ng/mL (ref <500)
BENZODIAZEPINES: POSITIVE ng/mL — AB (ref <100)
Buprenorphine, Urine: NEGATIVE ng/mL (ref <5)
Cocaine Metabolite: NEGATIVE ng/mL (ref <150)
Creatinine: 56.8 mg/dL
HYDROXYETHYLFLURAZEPAM: NEGATIVE ng/mL (ref <50)
Lorazepam: NEGATIVE ng/mL (ref <50)
MARIJUANA METABOLITE: NEGATIVE ng/mL (ref <20)
MDMA: NEGATIVE ng/mL (ref <500)
Nordiazepam: NEGATIVE ng/mL (ref <50)
OXAZEPAM: NEGATIVE ng/mL (ref <50)
OXYCODONE: NEGATIVE ng/mL (ref <100)
Opiates: NEGATIVE ng/mL (ref <100)
Oxidant: NEGATIVE ug/mL (ref <200)
TEMAZEPAM: NEGATIVE ng/mL (ref <50)
pH: 5.6 (ref 4.5–9.0)

## 2017-12-23 ENCOUNTER — Other Ambulatory Visit: Payer: Self-pay | Admitting: Family Medicine

## 2017-12-23 NOTE — Telephone Encounter (Signed)
Last office visit 12/03/2017.  Last refilled Gabapentin 11/18/2017 for #120 with no refills.  Alprazolam: 11/22/2017 for #90 with no refills.  Ok to refill?

## 2018-01-01 ENCOUNTER — Other Ambulatory Visit: Payer: Self-pay | Admitting: Family Medicine

## 2018-01-01 NOTE — Telephone Encounter (Signed)
Last office visit 12/03/2017 with Dr. [name].  Last refilled 11/01/2017 for #90 with no refills.  Ok to refill?

## 2018-01-13 ENCOUNTER — Ambulatory Visit (INDEPENDENT_AMBULATORY_CARE_PROVIDER_SITE_OTHER): Payer: Medicare Other | Admitting: Family Medicine

## 2018-01-13 ENCOUNTER — Encounter: Payer: Self-pay | Admitting: Family Medicine

## 2018-01-13 ENCOUNTER — Other Ambulatory Visit: Payer: Self-pay

## 2018-01-13 VITALS — BP 110/60 | HR 94 | Temp 98.3°F | Ht <= 58 in | Wt 229.5 lb

## 2018-01-13 DIAGNOSIS — M25512 Pain in left shoulder: Secondary | ICD-10-CM

## 2018-01-13 DIAGNOSIS — E11618 Type 2 diabetes mellitus with other diabetic arthropathy: Secondary | ICD-10-CM

## 2018-01-13 DIAGNOSIS — M7502 Adhesive capsulitis of left shoulder: Secondary | ICD-10-CM

## 2018-01-13 NOTE — Progress Notes (Signed)
Dr. Frederico Hamman T. Timur Nibert, MD, [facility] [place], [address] Phone: [phone] Fax: [phone]  01/13/2018  Patient: Sandra Ferrell, MRN: [id number], DOB: 1961-09-19, 57 y.o.  Primary Physician:  Jinny Sanders, MD   Chief Complaint  Patient presents with  . Follow-up    Left Shoulder-hurt again opening car door last week   Subjective:   Sandra Ferrell is a 57 y.o. very pleasant female patient who presents with the following:  F/u L shoulder, was having adhesive capsulitis.   Went out to the car last week and was doing ok, jerked up on her shoulder and pain down her arm at all. Now using patches again.   Additional history provided by the husband.  She was doing much better up until the time of her recent reinjury of this shoulder.  Range of motion had been improving, and her pain and been getting a whole lot better.  At this point she is back tracked and she is having some increased pain as well as some loss of motion.  11/11/2017 Last OV with Owens Loffler, MD  Sandra Ferrell is primarily here in follow-up regarding her left-sided adhesive capsulitis, last seen on September 09, 2017, and at that point I gave her some home exercises as well as did an intra-articular injection.  She has done well since then and has had some improvement in her range of motion and decreased pain on the left side.  She has been diligent with doing her rehab exercises at home.  Frozen shoulder, L and now ? Right?  She and her husband, who also provides a significant amount of history, have questions regarding her bilateral carpal tunnel syndrome as well as bilateral ulnar neuropathy, she has been seeing Dr. Grandville Silos.  They are contemplating having surgery, and they wanted to get a second opinion.  L shoulder hurting and also has some upcoming B cartal tunnel syndrome. ? Ulnar neuropathy.  Had EMG and NCV. They would like a second opinion  about CTS and R cubital tunnel.   Past Medical History, Surgical History, Social History, Family History, Problem List, Medications, and Allergies have been reviewed and updated if relevant.  Patient Active Problem List   Diagnosis Date Noted  . Viral syndrome 12/03/2017  . Narcotic dependence ([place]) 09/04/2017  . Leg cramping 09/04/2017  . 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 Bilateral carpal tunnel syndrome 04/16/2017  . Bilateral leg weakness 03/07/2017  . Muscular deconditioning 03/07/2017  . Bilateral numbness and tingling of arms and legs 03/05/2017  . Frequent falls 03/05/2017  . History of fusion of cervical spine 03/05/2017  . History of lumbar fusion 03/05/2017  . Peripheral edema 01/01/2017  . COPD exacerbation ([place]) 10/01/2016  . Neurogenic claudication 04/18/2016  . COPD, moderate ([place]) 03/22/2016  . Counseling regarding end of life decision making 09/13/2015  . Microalbuminuria 05/19/2015  . Myelopathy ([place]) 01/19/2015  . Unspecified constipation 07/10/2013  . Essential hypertension, benign 02/21/2011  . UTI'S, HX OF 12/26/2010  . Chronic back pain 02/22/2009  . Diabetes mellitus with no complication ([place]) 37/48/2707  . ALLERGIC RHINITIS 06/24/2007  . RENAL CALCULUS 06/24/2007  . OSTEOARTHRITIS 06/24/2007  . GAD (generalized anxiety disorder) 04/03/2007  . Hyperlipidemia LDL goal <100 03/12/2007  . TOBACCO ABUSE 03/12/2007  . Major depression, recurrent ([place]) 03/12/2007  . GERD 03/12/2007  . Fibromyalgia 03/12/2007  . SLEEP APNEA 03/12/2007    Past Medical  History:  Diagnosis Date  . Allergic rhinitis, cause unspecified   . Anxiety state, unspecified   . Backache, unspecified   . Calculus of kidney   . Carpal tunnel syndrome, right   . Cough   . Depressive disorder, not elsewhere classified    managed with medications  . Esophageal reflux   . Extrinsic asthma with exacerbation   . Fibromyalgia   . Heart murmur    . Hypertension   . Osteoarthrosis, unspecified whether generalized or localized, unspecified site   . Other and unspecified hyperlipidemia   . Other screening mammogram   . Personal history of unspecified urinary disorder   . Pneumonia   . Routine general medical examination at a health care facility   . Routine gynecological examination   . Tobacco use disorder   . Type II or unspecified type diabetes mellitus without mention of complication, not stated as uncontrolled   . Unspecified sleep apnea   . Wears glasses     Past Surgical History:  Procedure Laterality Date  . ANTERIOR CERVICAL DECOMP/DISCECTOMY FUSION N/A 01/19/2015   Procedure: ANTERIOR CERVICAL DECOMPRESSION/DISCECTOMY FUSION 2 LEVELS;  Surgeon: Phylliss Bob, MD;  Location: [facility];  Service: Orthopedics;  Laterality: N/A;  Anterior cervical decompression fusion, cervical 5-6, cervical 6-7 with instrumentation and allograft  . ANTERIOR CERVICAL DECOMP/DISCECTOMY FUSION N/A 05/23/2017   Procedure: ANTERIOR CERVICAL DECOMPRESSION FUSION, CERVICAL 4-5 WITH INSTRUMENTATION AND ALLOGRAFT;  Surgeon: Phylliss Bob, MD;  Location: [facility];  Service: Orthopedics;  Laterality: N/A;  ANTERIOR CERVICAL DECOMPRESSION FUSION, CERVICAL 4-5 WITH INSTRUMENTATION AND ALLOGRAFT; REQUEST 2 HOURS AND FLIP ROOM  . APPENDECTOMY  1973  . BACK SURGERY    . CHOLECYSTECTOMY  08/2006  . COLONOSCOPY    . TRANSTHORACIC ECHOCARDIOGRAM  02/2011   mild LVH, nl EF, mild diastolic dysfunction, no wall motion abnl    Social History   Socioeconomic History  . Marital status: Married    Spouse name: Not on file  . Number of children: Not on file  . Years of education: Not on file  . Highest education level: Not on file  Social Needs  . Financial resource strain: Not on file  . Food insecurity - worry: Not on file  . Food insecurity - inability: Not on file  . Transportation needs - medical: Not on file  . Transportation needs - non-medical: Not on file   Occupational History  . Occupation: Housewife  Tobacco Use  . Smoking status: Current Every Day Smoker    Packs/day: 1.00    Years: 35.00    Pack years: 35.00    Types: Cigarettes  . Smokeless tobacco: Never Used  Substance and Sexual Activity  . Alcohol use: No  . Drug use: No  . Sexual activity: No  Other Topics Concern  . Not on file  Social History Narrative   Moderate diet.    No living will, no HCPOA.    Family History  Problem Relation Age of Onset  . Stroke Father   . Colon cancer Cousin     Allergies  Allergen Reactions  . Benazepril Anaphylaxis, Swelling and Other (See Comments)    Angioedema, throat swelling  . Diclofenac Sodium Other (See Comments)    GI bleed  . Fluticasone-Salmeterol Hives, Itching and Swelling    Tongue was swollen  . Nsaids Other (See Comments)    Rectal bleeding  . Penicillins Hives and Rash    Has patient had a PCN reaction causing immediate rash, facial/tongue/throat swelling,  SOB or lightheadedness with hypotension: no Has patient had a PCN reaction causing severe rash involving mucus membranes or skin necrosis: no Has patient had a PCN reaction that required hospitalization no Has patient had a PCN reaction occurring within the last 10 years: no If all of the above answers are "NO", then may proceed with Cephalosporin use.   . Aspirin Other (See Comments)    Burns stomach  . Propoxyphene Other (See Comments)    Darvocet - Headache    Medication list reviewed and updated in full in [place].  GEN: No fevers, chills. Nontoxic. Primarily MSK c/o today. MSK: Detailed in the HPI GI: tolerating PO intake without difficulty Neuro: No numbness, parasthesias, or tingling associated. Otherwise the pertinent positives of the ROS are noted above.   Objective:   BP 110/60   Pulse 94   Temp 98.3 F (36.8 C) (Oral)   Ht _0  (1.473 m)   Wt 229 lb 8 oz (104.1 kg)   BMI 47.97 kg/m    GEN: WDWN, NAD, Non-toxic, Alert  & Oriented x 3 HEENT: Atraumatic, Normocephalic.  Ears and Nose: No external deformity. EXTR: No clubbing/cyanosis/edema NEURO: Normal gait.  PSYCH: Normally interactive. Conversant. Not depressed or anxious appearing.  Calm demeanor.   Shoulder: R and L Inspection: No muscle wasting or winging Ecchymosis/edema: neg  AC joint, scapula, clavicle: NT Cervical spine: NT, full ROM Spurling's: neg ABNORMAL SIDE TESTED: L UNLESS OTHERWISE NOTED, THE CONTRALATERAL SIDE HAS FULL RANGE OF MOTION. Abduction: 4/5, LIMITED TO 120 DEGREES Flexion: 5/5, LIMITED TO 130 DEGNO ROM  IR, lift-off: 5/5. TESTED AT 90 DEGREES OF ABDUCTION, LIMITED TO 15 DEGREES ER at neutral:  5/5, TESTED AT 90 DEGREES OF ABDUCTION, LIMITED TO 75 DEGREES AC crossover and compression: PAIN Drop Test: neg Empty Can: neg Supraspinatus insertion: NT Bicipital groove: NT ALL OTHER SPECIAL TESTING EQUIVOCAL GIVEN LOSS OF MOTION C5-T1 intact Sensation intact Grip 5/5    Radiology: No results found.  Assessment and Plan:   Adhesive capsulitis of left shoulder associated with type 2 diabetes mellitus (HCC)  Left shoulder pain, unspecified chronicity  Patient reinjured her shoulder less than 2 weeks ago, and she has taken the turn for the worse, but prior to this she had been doing quite a bit better per the patient and her husband.  With the mechanism, she certainly could have torn her capsule, and she also could have torn her rotator cuff, partial versus full.  I discussed all this with the patient.  I think it is reasonable to give this some time, continue with her home therapy, and she has made good progress thus far.  She has had a lot of surgeries, some of which have had poor outcomes, and she would like to do anything at all short of surgery if at all possible.  I think that this is reasonable.  Follow-up: Return in about 2 months (around 03/15/2018).  Signed,  Maud Deed. Viola Placeres, MD   Allergies as of  01/13/2018      Reactions   Benazepril Anaphylaxis, Swelling, Other (See Comments)   Angioedema, throat swelling   Diclofenac Sodium Other (See Comments)   GI bleed   Fluticasone-salmeterol Hives, Itching, Swelling   Tongue was swollen   Nsaids Other (See Comments)   Rectal bleeding   Penicillins Hives, Rash   Has patient had a PCN reaction causing immediate rash, facial/tongue/throat swelling, SOB or lightheadedness with hypotension: no Has patient had a PCN reaction causing severe  rash involving mucus membranes or skin necrosis: no Has patient had a PCN reaction that required hospitalization no Has patient had a PCN reaction occurring within the last 10 years: no If all of the above answers are "NO", then may proceed with Cephalosporin use.   Aspirin Other (See Comments)   Burns stomach   Propoxyphene Other (See Comments)   Darvocet - Headache      Medication List        Accurate as of 01/13/18 11:59 PM. Always use your most recent med list.          ACCU-CHEK AVIVA PLUS test strip Generic drug:  glucose blood TEST BLOOD SUGAR TWICE DAILY AND DIRECTED   ACCU-CHEK AVIVA PLUS w/Device Kit Check blood sugar twice daily and as directed.   ACCU-CHEK SOFTCLIX LANCETS lancets CHECK BLOOD SUGAR TWICE DAILY AND AS DIRECTED   acetaminophen-codeine 300-30 MG tablet Commonly known as:  TYLENOL #3 Take 1 tablet by mouth every 12 (twelve) hours as needed for moderate pain or severe pain. Fill after 01/31/2018   Adjustable Lancing Device Misc Check blood sugar twice a day and as directed. Dx E11.9   AIMSCO INSULIN SYR ULTRA THIN 31G X 5/16" 0.3 ML Misc Generic drug:  Insulin Syringe-Needle U-100   Alcohol Swabs Pads Check blood sugar twice a day and as directed. Dx E11.9   ALPRAZolam 0.5 MG tablet Commonly known as:  XANAX TAKE 1 TABLET BY MOUTH THREE TIMES A DAY   amitriptyline 50 MG tablet Commonly known as:  ELAVIL TAKE 1 TABLET(50 MG) BY MOUTH AT BEDTIME   ARNICARE  ARNICA Crea Apply 1 application topically as needed (Apply to ankles for pain.).   B-D ULTRAFINE III SHORT PEN 31G X 8 MM Misc Generic drug:  Insulin Pen Needle USE AS DIRECTED WITH INSULIN PEN   chlorpheniramine 4 MG tablet Commonly known as:  CHLOR-TRIMETON Take 8 mg by mouth 2 (two) times daily as needed for allergies (sinus congestion).   dicyclomine 20 MG tablet Commonly known as:  BENTYL Take 1 tablet (20 mg total) 2 (two) times daily by mouth.   furosemide 20 MG tablet Commonly known as:  LASIX TAKE 1 TABLET BY MOUTH EVERY DAY   gabapentin 300 MG capsule Commonly known as:  NEURONTIN TAKE 1 CAPSULE BY MOUTH IN THE MORNING, 1 CAPSULE IN THE AFTERNOON AND 2 CAPSULES AT BEDTIME   gemfibrozil 600 MG tablet Commonly known as:  LOPID TAKE 1 TABLET BY MOUTH TWICE A DAY   guaiFENesin 600 MG 12 hr tablet Commonly known as:  MUCINEX Take 600 mg 2 (two) times daily as needed by mouth for cough.   Insulin Glargine 100 UNIT/ML Solostar Pen Commonly known as:  LANTUS SOLOSTAR ADMINISTER 35 UNITS UNDER THE SKIN AT BEDTIME   metFORMIN 1000 MG tablet Commonly known as:  GLUCOPHAGE TAKE 1 TABLET BY MOUTH TWICE A DAY WITH MEAL   methocarbamol 500 MG tablet Commonly known as:  ROBAXIN TAKE 2 TABLETS BY MOUTH AT BEDTIME   methocarbamol 500 MG tablet Commonly known as:  ROBAXIN TAKE 2 TABLETS BY MOUTH AT BEDTIME   nicotine 14 mg/24hr patch Commonly known as:  NICODERM CQ - dosed in mg/24 hours Place 1 patch (14 mg total) onto the skin daily.   ondansetron 8 MG disintegrating tablet Commonly known as:  ZOFRAN ODT Take 1 tablet (8 mg total) every 8 (eight) hours as needed by mouth for nausea or vomiting.   pioglitazone 45 MG tablet Commonly known as:  ACTOS TAKE 1 TABLET BY MOUTH EVERY DAY   PROAIR HFA 108 (90 Base) MCG/ACT inhaler Generic drug:  albuterol INHALE 2 PUFFS BY MOUTH EVERY 4 HOURS AS NEEDED FOR WHEEZING OR SHORTNESS OF BREATH   ranitidine 150 MG  tablet Commonly known as:  ZANTAC Take 150 mg by mouth 2 (two) times daily.   sodium chloride 0.65 % Soln nasal spray Commonly known as:  OCEAN Place 2 sprays into both nostrils as needed for congestion.   SYMBICORT 80-4.5 MCG/ACT inhaler Generic drug:  budesonide-formoterol INHALE 2 PUFFS TWICE DAILY   TYLENOL SINUS+HEADACHE PO Take 2 tablets by mouth 2 (two) times daily.   VICTOZA 18 MG/3ML Sopn Generic drug:  liraglutide ADMINISTER 1.8 MG UNDER THE SKIN DAILY

## 2018-01-17 ENCOUNTER — Other Ambulatory Visit: Payer: Self-pay | Admitting: Family Medicine

## 2018-01-17 NOTE — Telephone Encounter (Signed)
Both last filled 12-24-17. Last OV 01-13-18 Next OV 03-04-18

## 2018-01-29 ENCOUNTER — Other Ambulatory Visit: Payer: Self-pay | Admitting: Family Medicine

## 2018-01-29 NOTE — Telephone Encounter (Signed)
Last office visit 0/3/09/2018 with Dr. Lorelei Pont.  Last refilled 01/01/2018 for #90 with no refills.  Ok to refill?

## 2018-01-31 NOTE — Telephone Encounter (Signed)
Pt is calling to follow up about medication - she would like to pick up today.  [phone]

## 2018-02-12 ENCOUNTER — Other Ambulatory Visit: Payer: Self-pay | Admitting: *Deleted

## 2018-02-12 NOTE — Telephone Encounter (Signed)
Last office visit 01/13/2018 with Dr. Lorelei Pont.  Last refilled 01/17/2018 for #120 with no refills.  Ok to refill?

## 2018-02-14 MED ORDER — GABAPENTIN 300 MG PO CAPS: ORAL_CAPSULE | ORAL | 0 refills | Status: DC

## 2018-02-14 NOTE — Telephone Encounter (Signed)
Please enter  rx sig for me with how pt takes the medication.

## 2018-02-19 ENCOUNTER — Other Ambulatory Visit: Payer: Self-pay | Admitting: Family Medicine

## 2018-02-19 NOTE — Telephone Encounter (Signed)
Last office visit 01/13/2018 with Dr. Lorelei Pont.   Last refilled 01/17/2018 for #90 with refills.  Ok to refill?

## 2018-02-21 NOTE — Telephone Encounter (Signed)
Pt states she was due for her refill today and would like to have refilled asap. Pt aware the office is closed, but asked if I could send a message.

## 2018-02-25 ENCOUNTER — Telehealth: Payer: Self-pay | Admitting: Family Medicine

## 2018-02-25 ENCOUNTER — Other Ambulatory Visit: Payer: Medicare Other

## 2018-02-25 DIAGNOSIS — E119 Type 2 diabetes mellitus without complications: Secondary | ICD-10-CM

## 2018-02-25 NOTE — Telephone Encounter (Signed)
-----   Message from Lendon Collar, RT sent at 02/18/2018  9:53 AM EDT ----- Regarding: Lab appt Monday 02/25/18 Please enter lab orders for 02/25/18. Thanks-Lauren

## 2018-02-26 ENCOUNTER — Other Ambulatory Visit (INDEPENDENT_AMBULATORY_CARE_PROVIDER_SITE_OTHER): Payer: Medicare Other

## 2018-02-26 DIAGNOSIS — E119 Type 2 diabetes mellitus without complications: Secondary | ICD-10-CM

## 2018-02-26 LAB — LIPID PANEL
CHOLESTEROL: 134 mg/dL (ref 0–200)
HDL: 46.6 mg/dL (ref >39.00)
LDL Cholesterol: 72 mg/dL (ref 0–99)
NonHDL: 87.46
Total CHOL/HDL Ratio: 3
Triglycerides: 79 mg/dL (ref 0.0–149.0)
VLDL: 15.8 mg/dL (ref 0.0–40.0)

## 2018-02-26 LAB — COMPREHENSIVE METABOLIC PANEL
ALBUMIN: 3.6 g/dL (ref 3.5–5.2)
ALK PHOS: 66 U/L (ref 39–117)
ALT: 15 U/L (ref 0–35)
AST: 17 U/L (ref 0–37)
BILIRUBIN TOTAL: 0.4 mg/dL (ref 0.2–1.2)
BUN: 15 mg/dL (ref 6–23)
CALCIUM: 8.8 mg/dL (ref 8.4–10.5)
CO2: 27 mEq/L (ref 19–32)
Chloride: 101 mEq/L (ref 96–112)
Creatinine, Ser: 0.74 mg/dL (ref 0.40–1.20)
GFR: 85.74 mL/min (ref >60.00)
GLUCOSE: 142 mg/dL — AB (ref 70–99)
POTASSIUM: 3.7 meq/L (ref 3.5–5.1)
Sodium: 135 mEq/L (ref 135–145)
TOTAL PROTEIN: 6.6 g/dL (ref 6.0–8.3)

## 2018-02-26 LAB — MICROALBUMIN / CREATININE URINE RATIO
CREATININE, U: 101.4 mg/dL
MICROALB/CREAT RATIO: 0.8 mg/g (ref 0.0–30.0)
Microalb, Ur: 0.8 mg/dL (ref 0.0–1.9)

## 2018-02-26 LAB — HEMOGLOBIN A1C: HEMOGLOBIN A1C: 6.7 % — AB (ref 4.6–6.5)

## 2018-03-03 ENCOUNTER — Other Ambulatory Visit: Payer: Self-pay | Admitting: Family Medicine

## 2018-03-03 NOTE — Telephone Encounter (Signed)
Last office visit 01/13/2018 with Dr. Lorelei Pont.   Last refilled 01/31/2018 for #90 with no refills.  Ok to refill?

## 2018-03-04 ENCOUNTER — Ambulatory Visit (INDEPENDENT_AMBULATORY_CARE_PROVIDER_SITE_OTHER): Payer: Medicare Other | Admitting: Family Medicine

## 2018-03-04 ENCOUNTER — Encounter: Payer: Self-pay | Admitting: Family Medicine

## 2018-03-04 VITALS — BP 132/60 | HR 91 | Ht <= 58 in | Wt 229.0 lb

## 2018-03-04 DIAGNOSIS — F411 Generalized anxiety disorder: Secondary | ICD-10-CM | POA: Diagnosis not present

## 2018-03-04 DIAGNOSIS — M549 Dorsalgia, unspecified: Secondary | ICD-10-CM

## 2018-03-04 DIAGNOSIS — E119 Type 2 diabetes mellitus without complications: Secondary | ICD-10-CM

## 2018-03-04 DIAGNOSIS — F3341 Major depressive disorder, recurrent, in partial remission: Secondary | ICD-10-CM

## 2018-03-04 DIAGNOSIS — G8929 Other chronic pain: Secondary | ICD-10-CM

## 2018-03-04 DIAGNOSIS — M797 Fibromyalgia: Secondary | ICD-10-CM | POA: Diagnosis not present

## 2018-03-04 LAB — HM DIABETES FOOT EXAM

## 2018-03-04 NOTE — Assessment & Plan Note (Signed)
Tolerable control of pain.

## 2018-03-04 NOTE — Assessment & Plan Note (Signed)
Likely inadequate control on amitriptyline.. Pt not interested in med change at this time.

## 2018-03-04 NOTE — Progress Notes (Signed)
Subjective:    Patient ID: Sandra Ferrell, female    DOB: 11/28/1960, 57 y.o.   MRN: [id number]  HPI   57 year old female presents for 3 month follow up DM  Diabetes:   Good control on metformin, actos and insulin. Lab Results  Component Value Date   HGBA1C 6.7 (H) 02/26/2018  Using medications without difficulties: Hypoglycemic episodes: none Hyperglycemic episodes: none Feet problems: no ulcer Blood Sugars averaging: FBS 1320-130 eye exam within last year: due  Has not been exercisng lately but spring cleaning.   Cholesterol at goal on lopid.  Chronic neck pain/fibromyalgia:   She has stopped tylenol 3 for pain as she understands alprazolam should not be used with this medication.  She reports her pain control is tolerable.  She is using tylenol, muscle relaxant    GAD: She reports  She has needing alprazolam three times a day.  Amitryptiline at bedtime, but still needs alprazolam at night.  Blood pressure 132/60, pulse 91, height 4\' 10"  (1.473 m), weight 229 lb (103.9 kg), SpO2 98 %.   Review of Systems  Constitutional: Positive for fatigue. Negative for fever.  HENT: Negative for congestion.   Eyes: Negative for pain.  Respiratory: Negative for cough and shortness of breath.   Cardiovascular: Negative for chest pain, palpitations and leg swelling.  Gastrointestinal: Negative for abdominal pain.  Genitourinary: Negative for dysuria and vaginal bleeding.  Musculoskeletal: Negative for back pain.  Neurological: Negative for syncope, light-headedness and headaches.  Psychiatric/Behavioral: Negative for dysphoric mood.       Objective:   Physical Exam  Constitutional: Vital signs are normal. She appears well-developed and well-nourished. She is cooperative.  Non-toxic appearance. She does not appear ill. No distress.  Morbid obesity  HENT:  Head: Normocephalic.  Right Ear: Hearing, tympanic membrane, external ear and ear canal normal. Tympanic membrane is not  erythematous, not retracted and not bulging.  Left Ear: Hearing, tympanic membrane, external ear and ear canal normal. Tympanic membrane is not erythematous, not retracted and not bulging.  Nose: No mucosal edema or rhinorrhea. Right sinus exhibits no maxillary sinus tenderness and no frontal sinus tenderness. Left sinus exhibits no maxillary sinus tenderness and no frontal sinus tenderness.  Mouth/Throat: Uvula is midline, oropharynx is clear and moist and mucous membranes are normal.  Eyes: Pupils are equal, round, and reactive to light. Conjunctivae, EOM and lids are normal. Lids are everted and swept, no foreign bodies found.  Neck: Trachea normal and normal range of motion. Neck supple. Carotid bruit is not present. No thyroid mass and no thyromegaly present.  Cardiovascular: Normal rate, regular rhythm, S1 normal, S2 normal, normal heart sounds, intact distal pulses and normal pulses. Exam reveals no gallop and no friction rub.  No murmur heard. Pulmonary/Chest: Effort normal and breath sounds normal. No tachypnea. No respiratory distress. She has no decreased breath sounds. She has no wheezes. She has no rhonchi. She has no rales.  Abdominal: Soft. Normal appearance and bowel sounds are normal. There is no tenderness.  Musculoskeletal:       Cervical back: She exhibits decreased range of motion, tenderness and bony tenderness.       Lumbar back: She exhibits decreased range of motion and tenderness.  Neurological: She is alert.  Skin: Skin is warm, dry and intact. No rash noted.  Psychiatric: Her speech is normal. Judgment and thought content normal. Her mood appears not anxious. Her affect is blunt. She is withdrawn. Cognition and memory are normal.  She exhibits a depressed mood.     Diabetic foot exam: Normal inspection No skin breakdown No calluses  Normal DP pulses Normal sensation to light touch and monofilament Nails thickened and long      Assessment & Plan:

## 2018-03-04 NOTE — Assessment & Plan Note (Signed)
She has been able  To wean off tyelnol 3. Pain is tolerable on tylenol and muscle relaxant.

## 2018-03-04 NOTE — Assessment & Plan Note (Addendum)
She is unable to wean down on alprazolam.. She is using it three times daily.  She also remains on amitriptyline  She does not tolerate SSRIs well.

## 2018-03-04 NOTE — Patient Instructions (Addendum)
Schedule eye exam this year.  Work on stress reduction and relaxation. Keep  Working on regular exercise as tolerated and weight loss .

## 2018-03-04 NOTE — Assessment & Plan Note (Signed)
Good control on current medication. Encouraged exercise, weight loss, healthy eating habits.

## 2018-03-12 ENCOUNTER — Telehealth: Payer: Self-pay | Admitting: Family Medicine

## 2018-03-12 NOTE — Telephone Encounter (Signed)
Copied from [place] [id number]. Topic: Quick Communication - See Telephone Encounter >> Mar 12, 2018  3:05 PM Synthia Innocent wrote: CRM for notification. See Telephone encounter for: 03/12/18. [employer] calling requesting DM care, patient not taking a high dose of statin. Please advise

## 2018-03-13 ENCOUNTER — Telehealth: Payer: Self-pay

## 2018-03-13 NOTE — Telephone Encounter (Signed)
Copied from [place] [id number]. Topic: General - Other >> Mar 13, 2018 12:15 PM Yvette Rack wrote: Reason for CRM: pt states that she was on methocarbamol (ROBAXIN) 500 MG tablet she was suppose to give her 90 pills and she was given 60 pills at the [facility] pt would like a call back as soon as possible she has changed pharmacys to [facility] on [address]

## 2018-03-13 NOTE — Telephone Encounter (Signed)
Spoke with Chrys Racer.  I advised Dr. [name] sent in #90 to [facility] on 03/04/2018.  She states she is trying to get all her medication transferred by to [facility] because she has been having issues with [facility] and they only sent [facility] a prescriptions for #60 but she has since been in touch with both pharmacies and they are working it out.  She will call us back if she needs any further assistance from our office.

## 2018-03-17 ENCOUNTER — Telehealth: Payer: Self-pay | Admitting: Family Medicine

## 2018-03-17 ENCOUNTER — Encounter: Payer: Self-pay | Admitting: Family Medicine

## 2018-03-17 ENCOUNTER — Ambulatory Visit (INDEPENDENT_AMBULATORY_CARE_PROVIDER_SITE_OTHER): Payer: Medicare Other | Admitting: Family Medicine

## 2018-03-17 VITALS — BP 120/64 | HR 97 | Temp 98.5°F | Ht <= 58 in | Wt 226.2 lb

## 2018-03-17 DIAGNOSIS — M7502 Adhesive capsulitis of left shoulder: Secondary | ICD-10-CM | POA: Diagnosis not present

## 2018-03-17 DIAGNOSIS — E11618 Type 2 diabetes mellitus with other diabetic arthropathy: Secondary | ICD-10-CM | POA: Diagnosis not present

## 2018-03-17 NOTE — Telephone Encounter (Signed)
Pt dropped off form to be excused from jury duty. Placed in RX tower. °

## 2018-03-17 NOTE — Progress Notes (Signed)
Dr. Frederico Hamman T. Burhan Barham, MD, [facility] [place], [address] Phone: [phone] Fax: [phone]  03/17/2018  Patient: Sandra Ferrell, MRN: [id number], DOB: [birth year], 57 y.o.  Primary Physician:  Sandra Sanders, MD   Chief Complaint  Patient presents with  . Follow-up    Left Shoulder   Subjective:   Sandra Ferrell is a 57 y.o. very pleasant female patient who presents with the following:  F/u frozen shoulder on the L:  Friday, Shingrix, L deltoid sore.  She does have a very small injection reaction there and is a little bit sore in the deltoid muscle.  ROM is much better her husband provides some additional history.. Prior to this injection, her active range of motion was dramatically better, and her ability to use the shoulder has been quite good.  She is been working on her range of motion every day at home.  01/13/2018 Last OV with Sandra Loffler, MD  F/u L shoulder, was having adhesive capsulitis.   Went out to the car last week and was doing ok, jerked up on her shoulder and pain down her arm at all. Now using patches again.   Additional history provided by the husband.  She was doing much better up until the time of her recent reinjury of this shoulder.  Range of motion had been improving, and her pain and been getting a whole lot better.  At this point she is back tracked and she is having some increased pain as well as some loss of motion.  11/11/2017 Last OV with Sandra Loffler, MD  Sandra Ferrell is primarily here in follow-up regarding her left-sided adhesive capsulitis, last seen on September 09, 2017, and at that point I gave her some home exercises as well as did an intra-articular injection. She has done well since then and has had some improvement in her range of motion and decreased pain on the left side. She has been diligent with doing her rehab exercises at home.  Frozen shoulder, L and now ?  Right?  She and her husband, who also provides a significant amount of history, have questions regarding her bilateral carpal tunnel syndrome as well as bilateral ulnar neuropathy, she has been seeing Dr. Grandville Ferrell. They are contemplating having surgery, and they wanted to get a second opinion.  L shoulder hurting and also has some upcoming B cartal tunnel syndrome. ? Ulnar neuropathy.  Had EMG and NCV. They would like a second opinion about CTS and R cubital tunnel.   Past Medical History, Surgical History, Social History, Family History, Problem List, Medications, and Allergies have been reviewed and updated if relevant.  Patient Active Problem List   Diagnosis Date Noted  . Leg cramping 09/04/2017  . Bilateral carpal tunnel syndrome 04/16/2017  . Bilateral leg weakness 03/07/2017  . Muscular deconditioning 03/07/2017  . Frequent falls 03/05/2017  . History of fusion of cervical spine 03/05/2017  . History of lumbar fusion 03/05/2017  . COPD exacerbation ([place]) 10/01/2016  . COPD, moderate ([place]) 03/22/2016  . Counseling regarding end of life decision making 09/13/2015  . Microalbuminuria 05/19/2015  . Unspecified constipation 07/10/2013  . Essential hypertension, benign 02/21/2011  . UTI'S, HX OF 12/26/2010  . Chronic back pain 02/22/2009  . Diabetes mellitus with no complication ([place]) 19/37/9024  . ALLERGIC RHINITIS 06/24/2007  . RENAL CALCULUS 06/24/2007  . OSTEOARTHRITIS 06/24/2007  . GAD (generalized anxiety disorder) 04/03/2007  . Hyperlipidemia LDL goal <  100 03/12/2007  . TOBACCO ABUSE 03/12/2007  . Major depression, recurrent ([place]) 03/12/2007  . GERD 03/12/2007  . Fibromyalgia 03/12/2007  . SLEEP APNEA 03/12/2007    Past Medical History:  Diagnosis Date  . Allergic rhinitis, cause unspecified   . Anxiety state, unspecified   . Backache, unspecified   . Calculus of kidney   . Carpal tunnel syndrome, right   . Cough   . Depressive disorder, not elsewhere  classified    managed with medications  . Esophageal reflux   . Extrinsic asthma with exacerbation   . Fibromyalgia   . Heart murmur   . Hypertension   . Osteoarthrosis, unspecified whether generalized or localized, unspecified site   . Other and unspecified hyperlipidemia   . Other screening mammogram   . Personal history of unspecified urinary disorder   . Pneumonia   . Routine general medical examination at a health care facility   . Routine gynecological examination   . Tobacco use disorder   . Type II or unspecified type diabetes mellitus without mention of complication, not stated as uncontrolled   . Unspecified sleep apnea   . Wears glasses     Past Surgical History:  Procedure Laterality Date  . ANTERIOR CERVICAL DECOMP/DISCECTOMY FUSION N/A 01/19/2015   Procedure: ANTERIOR CERVICAL DECOMPRESSION/DISCECTOMY FUSION 2 LEVELS;  Surgeon: Phylliss Bob, MD;  Location: [facility];  Service: Orthopedics;  Laterality: N/A;  Anterior cervical decompression fusion, cervical 5-6, cervical 6-7 with instrumentation and allograft  . ANTERIOR CERVICAL DECOMP/DISCECTOMY FUSION N/A 05/23/2017   Procedure: ANTERIOR CERVICAL DECOMPRESSION FUSION, CERVICAL 4-5 WITH INSTRUMENTATION AND ALLOGRAFT;  Surgeon: Phylliss Bob, MD;  Location: [facility];  Service: Orthopedics;  Laterality: N/A;  ANTERIOR CERVICAL DECOMPRESSION FUSION, CERVICAL 4-5 WITH INSTRUMENTATION AND ALLOGRAFT; REQUEST 2 HOURS AND FLIP ROOM  . APPENDECTOMY  1973  . BACK SURGERY    . CHOLECYSTECTOMY  08/2006  . COLONOSCOPY    . TRANSTHORACIC ECHOCARDIOGRAM  02/2011   mild LVH, nl EF, mild diastolic dysfunction, no wall motion abnl    Social History   Socioeconomic History  . Marital status: Married    Spouse name: Not on file  . Number of children: Not on file  . Years of education: Not on file  . Highest education level: Not on file  Occupational History  . Occupation: Housewife  Social Needs  . Financial resource strain: Not on  file  . Food insecurity:    Worry: Not on file    Inability: Not on file  . Transportation needs:    Medical: Not on file    Non-medical: Not on file  Tobacco Use  . Smoking status: Current Every Day Smoker    Packs/day: 1.00    Years: 35.00    Pack years: 35.00    Types: Cigarettes  . Smokeless tobacco: Never Used  Substance and Sexual Activity  . Alcohol use: No  . Drug use: No  . Sexual activity: Never  Lifestyle  . Physical activity:    Days per week: Not on file    Minutes per session: Not on file  . Stress: Not on file  Relationships  . Social connections:    Talks on phone: Not on file    Gets together: Not on file    Attends religious service: Not on file    Active member of club or organization: Not on file    Attends meetings of clubs or organizations: Not on file    Relationship status: Not on  file  . Intimate partner violence:    Fear of current or ex partner: Not on file    Emotionally abused: Not on file    Physically abused: Not on file    Forced sexual activity: Not on file  Other Topics Concern  . Not on file  Social History Narrative   Moderate diet.    No living will, no HCPOA.    Family History  Problem Relation Age of Onset  . Stroke Father   . Colon cancer Cousin     Allergies  Allergen Reactions  . Benazepril Anaphylaxis, Swelling and Other (See Comments)    Angioedema, throat swelling  . Diclofenac Sodium Other (See Comments)    GI bleed  . Fluticasone-Salmeterol Hives, Itching and Swelling    Tongue was swollen  . Nsaids Other (See Comments)    Rectal bleeding  . Penicillins Hives and Rash    Has patient had a PCN reaction causing immediate rash, facial/tongue/throat swelling, SOB or lightheadedness with hypotension: no Has patient had a PCN reaction causing severe rash involving mucus membranes or skin necrosis: no Has patient had a PCN reaction that required hospitalization no Has patient had a PCN reaction occurring within the  last 10 years: no If all of the above answers are "NO", then may proceed with Cephalosporin use.   . Aspirin Other (See Comments)    Burns stomach  . Propoxyphene Other (See Comments)    Darvocet - Headache    Medication list reviewed and updated in full in [facility].  GEN: No fevers, chills. Nontoxic. Primarily MSK c/o today. MSK: Detailed in the HPI GI: tolerating PO intake without difficulty Neuro: No numbness, parasthesias, or tingling associated. Otherwise the pertinent positives of the ROS are noted above.   Objective:   BP 120/64   Pulse 97   Temp 98.5 F (36.9 C) (Oral)   Ht 4' 10"  (1.473 m)   Wt 226 lb 4 oz (102.6 kg)   BMI 47.29 kg/m    GEN: WDWN, NAD, Non-toxic, Alert & Oriented x 3 HEENT: Atraumatic, Normocephalic.  Ears and Nose: No external deformity. EXTR: No clubbing/cyanosis/edema NEURO: Normal gait.  PSYCH: Normally interactive. Conversant. Not depressed or anxious appearing.  Calm demeanor.   Shoulder: R and L Inspection: No muscle wasting or winging Ecchymosis/edema: neg  AC joint, scapula, clavicle: NT Cervical spine: NT, full ROM Spurling's: neg ABNORMAL SIDE TESTED: L UNLESS OTHERWISE NOTED, THE CONTRALATERAL SIDE HAS FULL RANGE OF MOTION. Abduction: 5/5, LIMITED TO 170 DEGREES Flexion: 5/5, LIMITED TO 165 DEGNO ROM  IR, lift-off: 5/5. TESTED AT 90 DEGREES OF ABDUCTION, LIMITED TO 70 DEGREES ER at neutral:  5/5, TESTED AT 90 DEGREES OF ABDUCTION, LIMITED TO 80 DEGREES AC crossover and compression: PAIN Drop Test: neg Empty Can: neg Supraspinatus insertion: NT Bicipital groove: NT ALL OTHER SPECIAL TESTING EQUIVOCAL GIVEN LOSS OF MOTION C5-T1 intact Sensation intact Grip 5/5    Radiology: No results found.  Assessment and Plan:   Adhesive capsulitis of left shoulder associated with type 2 diabetes mellitus ([place])  Her shoulder really is doing remarkably better, and her range of motion is much better.  She is clearly in the  thawing phase.  I encouraged her to keep working on her range of motion daily, and I expect it will continue to improve over the next couple of months.  The injection reaction to Shingrix should be transient.  Ice or Tylenol.  Follow-up: prn only  Signed,  Shacara Cozine T.  Khloe Hunkele, MD   Allergies as of 03/17/2018      Reactions   Benazepril Anaphylaxis, Swelling, Other (See Comments)   Angioedema, throat swelling   Diclofenac Sodium Other (See Comments)   GI bleed   Fluticasone-salmeterol Hives, Itching, Swelling   Tongue was swollen   Nsaids Other (See Comments)   Rectal bleeding   Penicillins Hives, Rash   Has patient had a PCN reaction causing immediate rash, facial/tongue/throat swelling, SOB or lightheadedness with hypotension: no Has patient had a PCN reaction causing severe rash involving mucus membranes or skin necrosis: no Has patient had a PCN reaction that required hospitalization no Has patient had a PCN reaction occurring within the last 10 years: no If all of the above answers are "NO", then may proceed with Cephalosporin use.   Aspirin Other (See Comments)   Burns stomach   Propoxyphene Other (See Comments)   Darvocet - Headache      Medication List        Accurate as of 03/17/18 11:59 PM. Always use your most recent med list.          ACCU-CHEK AVIVA PLUS test strip Generic drug:  glucose blood TEST BLOOD SUGAR TWICE DAILY AND DIRECTED   ACCU-CHEK AVIVA PLUS w/Device Kit Check blood sugar twice daily and as directed.   ACCU-CHEK SOFTCLIX LANCETS lancets CHECK BLOOD SUGAR TWICE DAILY AND AS DIRECTED   Adjustable Lancing Device Misc Check blood sugar twice a day and as directed. Dx E11.9   AIMSCO INSULIN SYR ULTRA THIN 31G X 5/16" 0.3 ML Misc Generic drug:  Insulin Syringe-Needle U-100   Alcohol Swabs Pads Check blood sugar twice a day and as directed. Dx E11.9   ALPRAZolam 0.5 MG tablet Commonly known as:  XANAX TAKE 1 TABLET BY MOUTH THREE TIMES A  DAY   amitriptyline 50 MG tablet Commonly known as:  ELAVIL TAKE 1 TABLET(50 MG) BY MOUTH AT BEDTIME   ARNICARE ARNICA Crea Apply 1 application topically as needed (Apply to ankles for pain.).   B-D ULTRAFINE III SHORT PEN 31G X 8 MM Misc Generic drug:  Insulin Pen Needle USE AS DIRECTED WITH INSULIN PEN   chlorpheniramine 4 MG tablet Commonly known as:  CHLOR-TRIMETON Take 8 mg by mouth 2 (two) times daily as needed for allergies (sinus congestion).   dicyclomine 20 MG tablet Commonly known as:  BENTYL Take 1 tablet (20 mg total) 2 (two) times daily by mouth.   furosemide 20 MG tablet Commonly known as:  LASIX TAKE 1 TABLET BY MOUTH EVERY DAY   gabapentin 300 MG capsule Commonly known as:  NEURONTIN TAKE 1 CAPSULE BY MOUTH IN THE MORNING, 1 CAPSULE IN THE AFTERNOON AND 2 CAPSULES AT BEDTIME   gemfibrozil 600 MG tablet Commonly known as:  LOPID TAKE 1 TABLET BY MOUTH TWICE A DAY   guaiFENesin 600 MG 12 hr tablet Commonly known as:  MUCINEX Take 600 mg 2 (two) times daily as needed by mouth for cough.   Insulin Glargine 100 UNIT/ML Solostar Pen Commonly known as:  LANTUS SOLOSTAR ADMINISTER 35 UNITS UNDER THE SKIN AT BEDTIME   metFORMIN 1000 MG tablet Commonly known as:  GLUCOPHAGE TAKE 1 TABLET BY MOUTH TWICE A DAY WITH MEAL   methocarbamol 500 MG tablet Commonly known as:  ROBAXIN TAKE 2 TABLETS BY MOUTH AT BEDTIME   nicotine 14 mg/24hr patch Commonly known as:  NICODERM CQ - dosed in mg/24 hours Place 1 patch (14 mg total) onto the skin daily.  pioglitazone 45 MG tablet Commonly known as:  ACTOS TAKE 1 TABLET BY MOUTH EVERY DAY   PROAIR HFA 108 (90 Base) MCG/ACT inhaler Generic drug:  albuterol INHALE 2 PUFFS BY MOUTH EVERY 4 HOURS AS NEEDED FOR WHEEZING OR SHORTNESS OF BREATH   ranitidine 150 MG tablet Commonly known as:  ZANTAC Take 150 mg by mouth 2 (two) times daily.   sodium chloride 0.65 % Soln nasal spray Commonly known as:  OCEAN Place 2  sprays into both nostrils as needed for congestion.   SYMBICORT 80-4.5 MCG/ACT inhaler Generic drug:  budesonide-formoterol INHALE 2 PUFFS TWICE DAILY   TYLENOL SINUS+HEADACHE PO Take 2 tablets by mouth 2 (two) times daily.

## 2018-03-17 NOTE — Telephone Encounter (Signed)
Jury Summons placed in Dr. Bedsole's in box. 

## 2018-03-20 ENCOUNTER — Other Ambulatory Visit: Payer: Self-pay | Admitting: Family Medicine

## 2018-03-20 NOTE — Telephone Encounter (Signed)
Last office visit 03/17/2018 with Dr. Lorelei Pont.  Last refilled 02/14/2018 for #120 with no refills.  Ok to refill?

## 2018-03-21 ENCOUNTER — Other Ambulatory Visit: Payer: Self-pay | Admitting: Family Medicine

## 2018-03-21 NOTE — Telephone Encounter (Signed)
Last office visit 03/17/2018 with Dr. Lorelei Pont.  Last refilled 02/24/2018 with #90 with no refills.  Ok to refill?

## 2018-03-24 ENCOUNTER — Other Ambulatory Visit: Payer: Self-pay

## 2018-03-24 ENCOUNTER — Other Ambulatory Visit: Payer: Self-pay | Admitting: Family Medicine

## 2018-03-24 NOTE — Telephone Encounter (Signed)
Angie with [facility] said pt is calling to ck status of alprazolam. Please advise.

## 2018-03-24 NOTE — Telephone Encounter (Signed)
Copied from [place] [id number]. Topic: Inquiry >> Mar 24, 2018  9:17 AM Pricilla Handler wrote: Reason for CRM: Patient called requesting refills of ALPRAZolam (XANAX) 0.5 MG tablet and Gabapentin (NEURONTIN) 300 MG capsule. Patient did contact pharmacy. Patient's preferred pharmacy is [facility] - [address], [place] [phone] (Phone) [phone] (Fax)       Thank You!!!

## 2018-03-24 NOTE — Telephone Encounter (Signed)
Gabapentin sent in on 03/20/2018. Last office visit 03/17/2018 with Dr. Lorelei Pont.  Last refilled 02/24/2018 with #90 with no refills.  Ok to refill? Alprazolam was denied on 03/21/2018.  Ok to refill?

## 2018-03-24 NOTE — Telephone Encounter (Signed)
Duplicate request

## 2018-03-24 NOTE — Telephone Encounter (Signed)
LOV  03/04/18 Dr. [name] Xanax  02/24/18  # 90  0 refills

## 2018-03-24 NOTE — Telephone Encounter (Signed)
Copied from [place] [phone]. Topic: Inquiry >> Mar 24, 2018  9:17 AM Pricilla Handler wrote: Reason for CRM: Patient called requesting refills of ALPRAZolam (XANAX) 0.5 MG tablet and Gabapentin (NEURONTIN) 300 MG capsule. Patient did contact pharmacy. Patient's preferred pharmacy is [facility] - [address], [place] [phone] (Phone) [phone] (Fax)       Thank You!!!

## 2018-03-25 ENCOUNTER — Encounter: Payer: Self-pay | Admitting: Family Medicine

## 2018-03-25 ENCOUNTER — Other Ambulatory Visit: Payer: Self-pay | Admitting: Family Medicine

## 2018-03-25 DIAGNOSIS — Z7689 Persons encountering health services in other specified circumstances: Secondary | ICD-10-CM

## 2018-03-25 MED ORDER — ALPRAZOLAM 0.5 MG PO TABS: 0.5000 mg | ORAL_TABLET | Freq: Three times a day (TID) | ORAL | 0 refills | Status: DC

## 2018-03-25 NOTE — Telephone Encounter (Signed)
Sandra Ferrell notified alprazolam has been sent to her pharmacy.

## 2018-03-26 ENCOUNTER — Other Ambulatory Visit: Payer: Self-pay | Admitting: Family Medicine

## 2018-03-26 DIAGNOSIS — Z1231 Encounter for screening mammogram for malignant neoplasm of breast: Secondary | ICD-10-CM

## 2018-03-26 NOTE — Telephone Encounter (Signed)
Jeryl notified that jury duty letter is ready to be picked up at the front desk.

## 2018-03-27 ENCOUNTER — Telehealth: Payer: Self-pay

## 2018-03-27 NOTE — Telephone Encounter (Signed)
There is a letter for jury duty by Dr [name] on 03/25/18 letter tab.

## 2018-03-27 NOTE — Telephone Encounter (Signed)
Noted.  Tuesday is fine for her to pick up.

## 2018-03-27 NOTE — Telephone Encounter (Signed)
Copied from [place] [phone]. Topic: General - Other >> Mar 27, 2018 10:29 AM Boyd Kerbs wrote: Reason for CRM: letter for [facility] - she says can not get in to pick up until Tuesday

## 2018-03-29 ENCOUNTER — Other Ambulatory Visit: Payer: Self-pay | Admitting: Family Medicine

## 2018-04-10 DIAGNOSIS — H524 Presbyopia: Secondary | ICD-10-CM | POA: Diagnosis not present

## 2018-04-10 DIAGNOSIS — Z944 Liver transplant status: Secondary | ICD-10-CM | POA: Diagnosis not present

## 2018-04-10 DIAGNOSIS — E119 Type 2 diabetes mellitus without complications: Secondary | ICD-10-CM | POA: Diagnosis not present

## 2018-04-10 DIAGNOSIS — H5203 Hypermetropia, bilateral: Secondary | ICD-10-CM | POA: Diagnosis not present

## 2018-04-11 ENCOUNTER — Other Ambulatory Visit: Payer: Self-pay | Admitting: Family Medicine

## 2018-04-15 ENCOUNTER — Other Ambulatory Visit: Payer: Self-pay | Admitting: Family Medicine

## 2018-04-15 NOTE — Telephone Encounter (Signed)
Last office visit 03/17/2018 with Dr. Lorelei Pont.  Last refilled 03/04/2018 for #90 with no refills.  Ok to refill?

## 2018-04-16 ENCOUNTER — Ambulatory Visit: Payer: Medicare Other

## 2018-04-21 ENCOUNTER — Other Ambulatory Visit: Payer: Self-pay | Admitting: Family Medicine

## 2018-04-21 NOTE — Telephone Encounter (Signed)
Last office visit 03/17/2018 with Dr. Lorelei Pont.  Last refilled Alprazolam: 03/25/2018 for #90 with no refills.  Gabapentin: 03/20/2018 for #120 with no refills.  Ok to refill?

## 2018-04-23 ENCOUNTER — Other Ambulatory Visit: Payer: Self-pay | Admitting: Family Medicine

## 2018-04-23 ENCOUNTER — Telehealth: Payer: Self-pay | Admitting: Family Medicine

## 2018-04-23 NOTE — Telephone Encounter (Signed)
Copied from [place] [phone]. Topic: Quick Communication - Rx Refill/Question >> Apr 23, 2018  1:45 PM Mylinda Latina, NT wrote: Medication: PROAIR HFA 108 (90 Base) MCG/ACT inhaler  Has the patient contacted their pharmacy? Yes.  Told pt she didn't have any refills left , to call office  (Agent: If no, request that the patient contact the pharmacy for the refill.) (Agent: If yes, when and what did the pharmacy advise?)  Preferred Pharmacy (with phone number or street name): [facility] - [place], [address] [phone] (Phone) [phone] (Fax)      Agent: Please be advised that RX refills may take up to 3 business days. We ask that you follow-up with your pharmacy.

## 2018-04-24 NOTE — Telephone Encounter (Signed)
Called pt. To notify her Proair was refilled 03/29/18 to [facility] [address] with 5 refills. Instructed to have [facility] transfer prescription if she wants to change pharmacy.

## 2018-05-05 ENCOUNTER — Telehealth: Payer: Self-pay

## 2018-05-05 NOTE — Telephone Encounter (Signed)
Noted  

## 2018-05-05 NOTE — Telephone Encounter (Signed)
Copied from [place] [phone]. Topic: Quick Communication - Appointment Cancellation >> May 05, 2018 10:31 AM Rutherford Nail, NT wrote: Patient called to cancel appointment scheduled for 05/07/18. Patient has rescheduled their appointment.  Route to department's [facility] pool.

## 2018-05-07 ENCOUNTER — Ambulatory Visit: Payer: Medicare Other | Admitting: Family Medicine

## 2018-05-14 ENCOUNTER — Other Ambulatory Visit: Payer: Self-pay | Admitting: Family Medicine

## 2018-05-14 MED ORDER — FUROSEMIDE 20 MG PO TABS: 20.0000 mg | ORAL_TABLET | Freq: Every day | ORAL | 1 refills | Status: DC

## 2018-05-14 NOTE — Telephone Encounter (Signed)
Copied from [place] [phone]. Topic: Quick Communication - See Telephone Encounter >> May 14, 2018 12:09 PM Mylinda Latina, NT wrote: CRM for notification. See Telephone encounter for: 05/14/18. Patient called and states she needs a refill of her furosemide (LASIX) 20 MG tablet, methocarbamol (ROBAXIN) 500 MG tablet  [facility], [place] - [address] [phone] (Phone) [phone] (Fax)

## 2018-05-14 NOTE — Telephone Encounter (Signed)
Last office visit 03/17/2018 with Dr. Lorelei Pont.  Last refilled 04/15/2018 for #90 with no refills.  Ok to refill?

## 2018-05-15 NOTE — Telephone Encounter (Signed)
Robaxin refill  Last OV:03/17/18 Last refill:04/15/18 90 tab/0 refill DHW:[id number] Pharmacy: [facility] [place], [place] [phone] (Phone) [phone] (Fax)

## 2018-05-18 ENCOUNTER — Other Ambulatory Visit: Payer: Self-pay | Admitting: Family Medicine

## 2018-05-19 ENCOUNTER — Encounter: Payer: Self-pay | Admitting: Family Medicine

## 2018-05-19 ENCOUNTER — Ambulatory Visit (INDEPENDENT_AMBULATORY_CARE_PROVIDER_SITE_OTHER): Payer: Medicare Other | Admitting: Family Medicine

## 2018-05-19 ENCOUNTER — Other Ambulatory Visit: Payer: Self-pay | Admitting: Family Medicine

## 2018-05-19 ENCOUNTER — Ambulatory Visit (INDEPENDENT_AMBULATORY_CARE_PROVIDER_SITE_OTHER)
Admission: RE | Admit: 2018-05-19 | Discharge: 2018-05-19 | Disposition: A | Discharge status: Home / Self Care | Payer: Medicare Other | Source: Ambulatory Visit | Attending: Family Medicine | Admitting: Family Medicine

## 2018-05-19 VITALS — BP 134/70 | Temp 98.6°F | Ht <= 58 in | Wt 228.5 lb

## 2018-05-19 DIAGNOSIS — M5416 Radiculopathy, lumbar region: Secondary | ICD-10-CM

## 2018-05-19 DIAGNOSIS — M5137 Other intervertebral disc degeneration, lumbosacral region: Secondary | ICD-10-CM | POA: Diagnosis not present

## 2018-05-19 DIAGNOSIS — M961 Postlaminectomy syndrome, not elsewhere classified: Secondary | ICD-10-CM

## 2018-05-19 DIAGNOSIS — M545 Low back pain: Secondary | ICD-10-CM | POA: Diagnosis not present

## 2018-05-19 MED ORDER — AMITRIPTYLINE HCL 75 MG PO TABS: ORAL_TABLET | ORAL | 3 refills | Status: DC

## 2018-05-19 NOTE — Progress Notes (Signed)
Dr. Frederico Hamman T. Tzippy Testerman, MD, [facility] [place], [address] Phone: [phone] Fax: [phone]  05/19/2018  Patient: Sandra Ferrell, MRN: [id number], DOB: 09/24/1961, 57 y.o.  Primary Physician:  Jinny Sanders, MD   Chief Complaint  Patient presents with  . Hip Pain    low back, down into legs and cramps   Subjective:   Sandra Ferrell is a 57 y.o. very pleasant female patient who presents with the following:  Lumbar radiculopathy, down both sides.   [place] - has had a bone graft? Unclear back surgery. 2017? Nerve comression. Now with B radiculopathy. Pain in the buttocks region.   Radiofrequency ablation by [facility] [place] Dr. Lynann Bologna  Has had ESI and radiofrequency ablation.   Failed spine syndrome.  Robaxin 2 tabs po QHS Taking Gabapentin 300 mg, TID. 600 mg QHS. Taking some amytryptiline 50 mg, for depression . Taking xanax 0.5 mg, TID. Sometimes BID.  Taking ibuprofen, 8 OTC tabs over the counter.  Past Medical History, Surgical History, Social History, Family History, Problem List, Medications, and Allergies have been reviewed and updated if relevant.  Patient Active Problem List   Diagnosis Date Noted  . Leg cramping 09/04/2017  . Bilateral carpal tunnel syndrome 04/16/2017  . Bilateral leg weakness 03/07/2017  . Muscular deconditioning 03/07/2017  . Frequent falls 03/05/2017  . History of fusion of cervical spine 03/05/2017  . History of lumbar fusion 03/05/2017  . COPD exacerbation ([place]) 10/01/2016  . COPD, moderate ([place]) 03/22/2016  . Counseling regarding end of life decision making 09/13/2015  . Microalbuminuria 05/19/2015  . Unspecified constipation 07/10/2013  . Essential hypertension, benign 02/21/2011  . UTI'S, HX OF 12/26/2010  . Chronic low back pain 02/22/2009  . Diabetes mellitus with no complication ([place]) 86/57/8469  . ALLERGIC RHINITIS 06/24/2007  . RENAL CALCULUS 06/24/2007    . OSTEOARTHRITIS 06/24/2007  . GAD (generalized anxiety disorder) 04/03/2007  . Hyperlipidemia LDL goal <100 03/12/2007  . TOBACCO ABUSE 03/12/2007  . Major depression, recurrent ([place]) 03/12/2007  . GERD 03/12/2007  . Fibromyalgia 03/12/2007  . SLEEP APNEA 03/12/2007    Past Medical History:  Diagnosis Date  . Allergic rhinitis, cause unspecified   . Anxiety state, unspecified   . Backache, unspecified   . Calculus of kidney   . Carpal tunnel syndrome, right   . Cough   . Depressive disorder, not elsewhere classified    managed with medications  . Esophageal reflux   . Extrinsic asthma with exacerbation   . Fibromyalgia   . Heart murmur   . Hypertension   . Osteoarthrosis, unspecified whether generalized or localized, unspecified site   . Other and unspecified hyperlipidemia   . Other screening mammogram   . Personal history of unspecified urinary disorder   . Pneumonia   . Routine general medical examination at a health care facility   . Routine gynecological examination   . Tobacco use disorder   . Type II or unspecified type diabetes mellitus without mention of complication, not stated as uncontrolled   . Unspecified sleep apnea   . Wears glasses     Past Surgical History:  Procedure Laterality Date  . ANTERIOR CERVICAL DECOMP/DISCECTOMY FUSION N/A 01/19/2015   Procedure: ANTERIOR CERVICAL DECOMPRESSION/DISCECTOMY FUSION 2 LEVELS;  Surgeon: Phylliss Bob, MD;  Location: [facility];  Service: Orthopedics;  Laterality: N/A;  Anterior cervical decompression fusion, cervical 5-6, cervical 6-7 with instrumentation and allograft  . ANTERIOR CERVICAL  DECOMP/DISCECTOMY FUSION N/A 05/23/2017   Procedure: ANTERIOR CERVICAL DECOMPRESSION FUSION, CERVICAL 4-5 WITH INSTRUMENTATION AND ALLOGRAFT;  Surgeon: Phylliss Bob, MD;  Location: [facility];  Service: Orthopedics;  Laterality: N/A;  ANTERIOR CERVICAL DECOMPRESSION FUSION, CERVICAL 4-5 WITH INSTRUMENTATION AND ALLOGRAFT; REQUEST 2 HOURS  AND FLIP ROOM  . APPENDECTOMY  1973  . BACK SURGERY    . CHOLECYSTECTOMY  08/2006  . COLONOSCOPY    . TRANSTHORACIC ECHOCARDIOGRAM  02/2011   mild LVH, nl EF, mild diastolic dysfunction, no wall motion abnl    Social History   Socioeconomic History  . Marital status: Married    Spouse name: Not on file  . Number of children: Not on file  . Years of education: Not on file  . Highest education level: Not on file  Occupational History  . Occupation: Housewife  Social Needs  . Financial resource strain: Not on file  . Food insecurity:    Worry: Not on file    Inability: Not on file  . Transportation needs:    Medical: Not on file    Non-medical: Not on file  Tobacco Use  . Smoking status: Current Every Day Smoker    Packs/day: 1.00    Years: 35.00    Pack years: 35.00    Types: Cigarettes  . Smokeless tobacco: Never Used  Substance and Sexual Activity  . Alcohol use: No  . Drug use: No  . Sexual activity: Never  Lifestyle  . Physical activity:    Days per week: Not on file    Minutes per session: Not on file  . Stress: Not on file  Relationships  . Social connections:    Talks on phone: Not on file    Gets together: Not on file    Attends religious service: Not on file    Active member of club or organization: Not on file    Attends meetings of clubs or organizations: Not on file    Relationship status: Not on file  . Intimate partner violence:    Fear of current or ex partner: Not on file    Emotionally abused: Not on file    Physically abused: Not on file    Forced sexual activity: Not on file  Other Topics Concern  . Not on file  Social History Narrative   Moderate diet.    No living will, no HCPOA.    Family History  Problem Relation Age of Onset  . Stroke Father   . Colon cancer Cousin     Allergies  Allergen Reactions  . Benazepril Anaphylaxis, Swelling and Other (See Comments)    Angioedema, throat swelling  . Diclofenac Sodium Other (See  Comments)    GI bleed  . Fluticasone-Salmeterol Hives, Itching and Swelling    Tongue was swollen  . Nsaids Other (See Comments)    Rectal bleeding  . Penicillins Hives and Rash    Has patient had a PCN reaction causing immediate rash, facial/tongue/throat swelling, SOB or lightheadedness with hypotension: no Has patient had a PCN reaction causing severe rash involving mucus membranes or skin necrosis: no Has patient had a PCN reaction that required hospitalization no Has patient had a PCN reaction occurring within the last 10 years: no If all of the above answers are "NO", then may proceed with Cephalosporin use.   . Aspirin Other (See Comments)    Burns stomach  . Propoxyphene Other (See Comments)    Darvocet - Headache    Medication list reviewed and  updated in full in [facility].  GEN: No fevers, chills. Nontoxic. Primarily MSK c/o today. MSK: Detailed in the HPI GI: tolerating PO intake without difficulty Neuro: No numbness, parasthesias, or tingling associated. Otherwise the pertinent positives of the ROS are noted above.   Objective:   BP 134/70   Temp 98.6 F (37 C) (Oral)   Ht '4\' 10"'$  (1.473 m)   Wt 228 lb 8 oz (103.6 kg)   BMI 47.76 kg/m    GEN: WDWN, NAD, Non-toxic, Alert & Oriented x 3 HEENT: Atraumatic, Normocephalic.  Ears and Nose: No external deformity. EXTR: No clubbing/cyanosis/edema NEURO: Normal gait.  PSYCH: Normally interactive. Conversant. Not depressed or anxious appearing.  Calm demeanor.    Exam is difficult secondary to acute pain.  Patient is diffusely tender from T12-S1 on both sides.  There is no significant tenderness in the spinous processes.  She has tenderness throughout both SI joints as well as the entire buttocks region.  Range of motion is quite limited in all directions.  She has sensation that is intact throughout her lower extremities.  Strength is intact throughout.  Globally neurovascularly intact.  Negative  straight leg raise.  Radiology: Dg Lumbar Spine Complete  Result Date: 05/19/2018 CLINICAL DATA:  Lumbago with radicular symptoms EXAM: LUMBAR SPINE - COMPLETE 4+ VIEW COMPARISON:  Lumbar MRI April 17, 2017 FINDINGS: Frontal, lateral, spot lumbosacral lateral, and bilateral oblique views were obtained. There are 5 non-rib-bearing lumbar type vertebral bodies. There is thoracolumbar levoscoliosis. There is posterior screw and plate fixation at L4 and L5 with pedicle screws within the respective vertebral bodies. Disc spacer noted at L4-5. No fracture or spondylolisthesis evident. There is moderately severe disc space narrowing at all levels. There is facet osteoarthritic change at all levels bilaterally. There is aortic atherosclerosis. IMPRESSION: Postoperative change at L4 and L5 with support hardware intact. Thoracolumbar levoscoliosis. Extensive multilevel osteoarthritic change. No acute fracture or spondylolisthesis. There is aortic atherosclerosis. Aortic Atherosclerosis (ICD10-I70.0). Electronically Signed   By: Lowella Grip III M.D.   On: 05/19/2018 11:40    CLINICAL DATA:  58 year old female with lumbar back pain radiating to both hips and thighs with leg cramping. Prior surgery in 2017.  EXAM: MRI LUMBAR SPINE WITHOUT CONTRAST  TECHNIQUE: Multiplanar, multisequence MR imaging of the lumbar spine was performed. No intravenous contrast was administered.  COMPARISON:  Lumbar radiographs 03/05/2017. Preoperative lumbar MRI 03/30/2016. Intraoperative images 614 2017.  FINDINGS: Segmentation: Normal is demonstrated on the comparison radiographs and this is the same numbering system used on the 2017 preoperative MRI.  Alignment: Stable chronic grade 1 anterolisthesis at L4-L5 with postoperative changes at that level. Increased retrolisthesis and focal lordosis at L3-L4 since the prior MRI (series 4, image 6). Mild retrolisthesis at L2-L3 also has developed since the prior  MRI.  Vertebrae: Mild hardware susceptibility artifact now at L4-L5. No marrow edema at those levels. New degenerative appearing endplate marrow edema at L2-L3. The 2017 mild degenerative appearing vertebral edema at L1-L2 has largely resolved. Widespread superimposed chronic degenerative endplate marrow signal changes in the anterior lower thoracic and upper lumbar endplates, including multiple small chronic Schmorl nodes. Underlying bone marrow signal is normal. Negative visible sacrum and SI joints.  Conus medullaris: Extends to L1-L2. No signal abnormality identified in the lower thoracic spinal cord or conus.  Paraspinal and other soft tissues: Mild postoperative changes to the posterior paraspinal soft tissues centered at L4. No postoperative fluid collection identified.  Negative visualized abdominal viscera.  Disc  levels:  T10-T11: Lower thoracic epidural lipomatosis. Mild to moderate facet hypertrophy greater on the left. Minimal disc bulge. Mild spinal stenosis. Mild to moderate left T10 foraminal stenosis.  T11-12: Lower thoracic epidural lipomatosis. Minimal disc bulge. Mild spinal stenosis.  T12-L1: Severe disc space loss. Circumferential disc osteophyte complex. Broad-based posterior component. Epidural lipomatosis. Mild right facet hypertrophy. Mild spinal and right T12 foraminal stenosis. This level is stable.  L1-L2: Disc space loss. Circumferential disc osteophyte complex with broad-based posterior component. Epidural lipomatosis. Mild spinal. Mild left and moderate right L1 foraminal stenosis. This level is stable.  L2-L3: Mild retrolisthesis. Chronic disc space loss. Interval vacuum disc. Broad-based posterior and left far lateral disc extrusion. Moderate facet and ligament flavum hypertrophy. Epidural lipomatosis. Moderate to severe spinal stenosis. Moderate bilateral L2 foraminal stenosis. This level has mildly progressed.  L3-L4: Chronic  disc space loss with broad-based posterior and far lateral disc extrusion. Moderate facet and ligament flavum hypertrophy. Epidural lipomatosis. Moderate to severe spinal stenosis. Moderate left greater than right L3 foraminal stenosis. This level is stable.  L4-L5: Sequelae of posterior and interbody fusion. Sequelae of partial decompression. Improved thecal sac patency (series 7, image 25) where previously severe spinal and left lateral recess stenosis were noted. There is some postoperative architectural distortion at the left lateral recess. Left foraminal patency is improved also with mild architectural distortion. Mild right L4 foraminal stenosis is stable.  L5-S1: Chronic circumferential but mostly left far lateral disc osteophyte complex. Moderate to severe facet hypertrophy. Moderate ligament flavum hypertrophy. Mild left lateral recess stenosis. Borderline to mild spinal and bilateral L5 foraminal stenosis. This level has mildly progressed.  IMPRESSION: 1. Postoperative changes at L4-L5 with improved thecal sac and left foraminal patency at that level compared to the preoperative MRI in 2017. 2. Adjacent segment disease at L3-L4 with new mild retrolisthesis although multifactorial moderate to severe spinal and moderate left greater than right L3 foraminal stenosis at that level has not significantly changed. 3. Progressed L2-L3 degeneration also with new mild retrolisthesis. Increased moderate to severe spinal and moderate bilateral foraminal stenosis since 2017. 4. Chronic severe facet degeneration at L5-S1 also appears mildly progressed along with borderline to mild spinal, left lateral recess, and biforaminal stenosis. 5. Other lumbar levels are stable including mild spinal and mild or moderate foraminal stenosis at T12-L1 and L1-L2. 6. Note that chronic widespread lower thoracic and lumbar epidural lipomatosis contributes to spinal stenosis.   Electronically  Signed   By: Genevie Ann M.D.   On: 04/17/2017 15:31  Assessment and Plan:   Bilateral lumbar radiculopathy - Plan: DG Lumbar Spine Complete  Failed back syndrome of lumbar spine  DDD (degenerative disc disease), lumbosacral  >40 minutes spent in face to face time with patient, >50% spent in counselling or coordination of care   Patient known well.  Prior L4-5 back surgery and doing poorly globally.  She has had radiofrequency ablation as well as epidural steroid injections by Dr. Mina Marble in the past also.  Challenging case.  Potential for improvement: Guarded.  She is on a good pain regimen currently.  I am going to increase her amitriptyline to 75 mg at nighttime as well as increase her gabapentin dosing.  Hopefully this will help some.  She has had poor responses to steroids in the past which have dramatically increase her diabetes, so I will hold on this for now.  If symptoms persist the next logical step might be to have reconsultation with physical medicine and rehab and  consideration of epidural steroid injection. Pain management consultation would not be unreasonable.   Globally, the patient has extensive degenerative disc changes throughout the lumbar spine and arthritic changes throughout every level in the lumbar spine as well.  I reviewed with the patient that the goal in therapy would be to achieve some improvement, but it is unlikely in the extreme that she will be completely pain-free lifelong.  Follow-up: prn only - if worsens, PM and R would be reasonable next step.  Meds ordered this encounter  Medications  . amitriptyline (ELAVIL) 75 MG tablet    Sig: TAKE 1 TABLET(75 MG) BY MOUTH AT BEDTIME    Dispense:  30 tablet    Refill:  3   Orders Placed This Encounter  Procedures  . DG Lumbar Spine Complete    Signed,  Mannix Kroeker T. Justyn Langham, MD   Allergies as of 05/19/2018      Reactions   Benazepril Anaphylaxis, Swelling, Other (See Comments)   Angioedema, throat  swelling   Diclofenac Sodium Other (See Comments)   GI bleed   Fluticasone-salmeterol Hives, Itching, Swelling   Tongue was swollen   Nsaids Other (See Comments)   Rectal bleeding   Penicillins Hives, Rash   Has patient had a PCN reaction causing immediate rash, facial/tongue/throat swelling, SOB or lightheadedness with hypotension: no Has patient had a PCN reaction causing severe rash involving mucus membranes or skin necrosis: no Has patient had a PCN reaction that required hospitalization no Has patient had a PCN reaction occurring within the last 10 years: no If all of the above answers are "NO", then may proceed with Cephalosporin use.   Aspirin Other (See Comments)   Burns stomach   Propoxyphene Other (See Comments)   Darvocet - Headache      Medication List        Accurate as of 05/19/18 11:59 PM. Always use your most recent med list.          ACCU-CHEK AVIVA PLUS test strip Generic drug:  glucose blood TEST BLOOD SUGAR TWICE DAILY AND DIRECTED   ACCU-CHEK AVIVA PLUS w/Device Kit Check blood sugar twice daily and as directed.   ACCU-CHEK SOFTCLIX LANCETS lancets CHECK BLOOD SUGAR TWICE DAILY AND AS DIRECTED   acetaminophen 650 MG CR tablet Commonly known as:  TYLENOL Take 1,300 mg by mouth 2 (two) times daily.   acetaminophen 500 MG tablet Commonly known as:  TYLENOL Take 500 mg by mouth every 6 (six) hours as needed. Take three by month at noon   Adjustable Lancing Device Misc Check blood sugar twice a day and as directed. Dx E11.9   AIMSCO INSULIN SYR ULTRA THIN 31G X 5/16" 0.3 ML Misc Generic drug:  Insulin Syringe-Needle U-100   Alcohol Swabs Pads Check blood sugar twice a day and as directed. Dx E11.9   ALPRAZolam 0.5 MG tablet Commonly known as:  XANAX TAKE 1 TABLET BY MOUTH THREE TIMES DAILY   ALPRAZolam 0.5 MG tablet Commonly known as:  XANAX Take 1 tablet (0.5 mg total) by mouth 3 (three) times daily.   amitriptyline 75 MG tablet Commonly  known as:  ELAVIL TAKE 1 TABLET(75 MG) BY MOUTH AT BEDTIME   ARNICARE ARNICA Crea Apply 1 application topically as needed (Apply to ankles for pain.).   B-D ULTRAFINE III SHORT PEN 31G X 8 MM Misc Generic drug:  Insulin Pen Needle USE AS DIRECTED WITH INSULIN PEN   chlorpheniramine 4 MG tablet Commonly known as:  CHLOR-TRIMETON Take 8  mg by mouth 2 (two) times daily as needed for allergies (sinus congestion).   dicyclomine 20 MG tablet Commonly known as:  BENTYL Take 1 tablet (20 mg total) 2 (two) times daily by mouth.   furosemide 20 MG tablet Commonly known as:  LASIX Take 1 tablet (20 mg total) by mouth daily.   gabapentin 300 MG capsule Commonly known as:  NEURONTIN TAKE 1 CAPSULE BY MOUTH IN THE MORNING, 1 CAPSULE IN THE AFTERNOON AND 2 CAPSULES AT BEDTIME   gemfibrozil 600 MG tablet Commonly known as:  LOPID TAKE 1 TABLET BY MOUTH TWICE A DAY   guaiFENesin 600 MG 12 hr tablet Commonly known as:  MUCINEX Take 600 mg 2 (two) times daily as needed by mouth for cough.   Insulin Glargine 100 UNIT/ML Solostar Pen Commonly known as:  LANTUS SOLOSTAR ADMINISTER 35 UNITS UNDER THE SKIN AT BEDTIME   metFORMIN 1000 MG tablet Commonly known as:  GLUCOPHAGE TAKE 1 TABLET BY MOUTH TWICE A DAY WITH MEAL   methocarbamol 500 MG tablet Commonly known as:  ROBAXIN TAKE 2 TABLETS BY MOUTH AT BEDTIME   nicotine 14 mg/24hr patch Commonly known as:  NICODERM CQ - dosed in mg/24 hours Place 1 patch (14 mg total) onto the skin daily.   OVER THE COUNTER MEDICATION [place] pills, take 2 fours times a day   pioglitazone 45 MG tablet Commonly known as:  ACTOS TAKE 1 TABLET BY MOUTH EVERY DAY   PROAIR HFA 108 (90 Base) MCG/ACT inhaler Generic drug:  albuterol TAKE 2 PUFFS BY MOUTH EVERY 4 HOURS AS NEEDED FOR WHEEZE OR FOR SHORTNESS OF BREATH   ranitidine 150 MG tablet Commonly known as:  ZANTAC Take 150 mg by mouth 2 (two) times daily.   sodium chloride 0.65 % Soln nasal  spray Commonly known as:  OCEAN Place 2 sprays into both nostrils as needed for congestion.   SYMBICORT 80-4.5 MCG/ACT inhaler Generic drug:  budesonide-formoterol INHALE 2 PUFFS TWICE DAILY   TYLENOL SINUS+HEADACHE PO Take 2 tablets by mouth 2 (two) times daily.

## 2018-05-19 NOTE — Telephone Encounter (Signed)
Copied from [place] [phone]. Topic: Quick Communication - Rx Refill/Question >> May 19, 2018  2:10 PM Judyann Munson wrote: Medication: ALPRAZolam Duanne Moron) 0.5 MG tablet   Has the patient contacted their pharmacy? yes  Preferred Pharmacy (with phone number or street name): [facility] - [place], [address] [place] [phone] (Phone) [phone] (Fax)      Agent: Please be advised that RX refills may take up to 3 business days. We ask that you follow-up with your pharmacy.

## 2018-05-19 NOTE — Patient Instructions (Signed)
Increase your morning gabapentin dosing to 2 tablets.   Keep up 1 tablet during the day.   Keep up 2 tablets at night.

## 2018-05-19 NOTE — Telephone Encounter (Signed)
Name of Medication: alprazolam 0.5 mg Name of Pharmacy: [facility] or Written Date and Quantity: # 62 on 04/21/18 Last Office Visit and Type: 03/04/18 for diabetes; acute visit 05/19/18 Next Office Visit and Type: 06/12/18 annual exam Last Controlled Substance Agreement Date: 12/03/17 Last UDS:12/03/17

## 2018-05-19 NOTE — Telephone Encounter (Signed)
Electronic refill request Last office visit 05/19/18 Last refill 04/21/18 #90

## 2018-05-20 ENCOUNTER — Encounter: Payer: Self-pay | Admitting: Family Medicine

## 2018-05-20 MED ORDER — ALPRAZOLAM 0.5 MG PO TABS: 0.5000 mg | ORAL_TABLET | Freq: Three times a day (TID) | ORAL | 0 refills | Status: DC

## 2018-05-22 ENCOUNTER — Other Ambulatory Visit: Payer: Self-pay | Admitting: Family Medicine

## 2018-05-23 NOTE — Telephone Encounter (Signed)
Last filled 02/12/2018... Please advise if okay for pt to continue medication

## 2018-05-27 ENCOUNTER — Ambulatory Visit: Payer: Medicare Other

## 2018-06-03 ENCOUNTER — Encounter: Payer: Medicare Other | Admitting: Family Medicine

## 2018-06-05 ENCOUNTER — Other Ambulatory Visit: Payer: Self-pay | Admitting: Family Medicine

## 2018-06-05 ENCOUNTER — Ambulatory Visit: Payer: Medicare Other

## 2018-06-12 ENCOUNTER — Other Ambulatory Visit: Payer: Self-pay | Admitting: Family Medicine

## 2018-06-12 ENCOUNTER — Encounter: Payer: Medicare Other | Admitting: Family Medicine

## 2018-06-13 ENCOUNTER — Telehealth: Payer: Self-pay | Admitting: *Deleted

## 2018-06-13 ENCOUNTER — Other Ambulatory Visit: Payer: Self-pay | Admitting: Family Medicine

## 2018-06-13 NOTE — Telephone Encounter (Signed)
Shirlean Mylar, Will you please schedule fasting lab appointment for Mrs. Puertas prior to her CPE with Dr. [name] on 06/20/2018.  We can probably cancel the [place] with Lattie Haw in September because Dr. [name] will probably do everything in August.

## 2018-06-13 NOTE — Telephone Encounter (Signed)
Lab appointment 8/13 Sept appointment canceled Pt aware

## 2018-06-13 NOTE — Telephone Encounter (Signed)
Copied from [place] [id number]. Topic: Appointment Scheduling - Scheduling Inquiry for Clinic >> Jun 13, 2018 12:58 PM Blase Mess A wrote: Reason for SYP:[id number] scheduled for awv/labs in September, Scheduled for CPE on June 20, 2018. Patient is wanting to know if she can have labs prior to CPE rather than AWV appt in September.Please advise.

## 2018-06-15 ENCOUNTER — Other Ambulatory Visit: Payer: Self-pay | Admitting: Family Medicine

## 2018-06-16 NOTE — Telephone Encounter (Signed)
Last office visit 05/19/2018 with Dr. Lorelei Pont.  Last refilled 05/15/2018 for #90 with no refills. Ok to refill?

## 2018-06-17 ENCOUNTER — Telehealth: Payer: Self-pay | Admitting: Family Medicine

## 2018-06-17 ENCOUNTER — Other Ambulatory Visit: Payer: Medicare Other

## 2018-06-17 DIAGNOSIS — E119 Type 2 diabetes mellitus without complications: Secondary | ICD-10-CM

## 2018-06-17 NOTE — Telephone Encounter (Signed)
-----   Message from Ellamae Sia sent at 06/16/2018  2:19 PM EDT ----- Regarding: Lab orders for Tuesday, 8.13.19 Patient is scheduled for CPX labs, please order future labs, Thanks , Karna Christmas

## 2018-06-18 ENCOUNTER — Other Ambulatory Visit (INDEPENDENT_AMBULATORY_CARE_PROVIDER_SITE_OTHER): Payer: Medicare Other

## 2018-06-18 DIAGNOSIS — E119 Type 2 diabetes mellitus without complications: Secondary | ICD-10-CM

## 2018-06-18 LAB — HEMOGLOBIN A1C: Hgb A1c MFr Bld: 6.5 % (ref 4.6–6.5)

## 2018-06-19 ENCOUNTER — Other Ambulatory Visit: Payer: Self-pay | Admitting: Family Medicine

## 2018-06-19 NOTE — Telephone Encounter (Signed)
Name of Medication: alprazolam 0.5 mg Name of Pharmacy: [facility] or Written Date and Quantity: # 90 on 05/20/2018 Last Office Visit and Type: 05/19/2018 with Dr. Lorelei Pont for back pain Next Office Visit and Type: 06/20/18 annual exam Last Controlled Substance Agreement Date: 12/03/17 Last UDS:12/03/17

## 2018-06-20 ENCOUNTER — Encounter: Payer: Self-pay | Admitting: Family Medicine

## 2018-06-20 ENCOUNTER — Ambulatory Visit (INDEPENDENT_AMBULATORY_CARE_PROVIDER_SITE_OTHER): Payer: Medicare Other | Admitting: Family Medicine

## 2018-06-20 VITALS — BP 120/60 | HR 97 | Temp 98.7°F | Ht <= 58 in | Wt 226.2 lb

## 2018-06-20 DIAGNOSIS — F3341 Major depressive disorder, recurrent, in partial remission: Secondary | ICD-10-CM

## 2018-06-20 DIAGNOSIS — E785 Hyperlipidemia, unspecified: Secondary | ICD-10-CM

## 2018-06-20 DIAGNOSIS — Z0000 Encounter for general adult medical examination without abnormal findings: Secondary | ICD-10-CM | POA: Diagnosis not present

## 2018-06-20 DIAGNOSIS — I10 Essential (primary) hypertension: Secondary | ICD-10-CM | POA: Diagnosis not present

## 2018-06-20 DIAGNOSIS — E119 Type 2 diabetes mellitus without complications: Secondary | ICD-10-CM | POA: Diagnosis not present

## 2018-06-20 DIAGNOSIS — J449 Chronic obstructive pulmonary disease, unspecified: Secondary | ICD-10-CM | POA: Diagnosis not present

## 2018-06-20 DIAGNOSIS — F411 Generalized anxiety disorder: Secondary | ICD-10-CM

## 2018-06-20 LAB — HM DIABETES FOOT EXAM

## 2018-06-20 MED ORDER — INSULIN GLARGINE 100 UNIT/ML SOLOSTAR PEN: PEN_INJECTOR | SUBCUTANEOUS | 5 refills | Status: DC

## 2018-06-20 MED ORDER — BUDESONIDE-FORMOTEROL FUMARATE 160-4.5 MCG/ACT IN AERO: 2.0000 | INHALATION_SPRAY | Freq: Two times a day (BID) | RESPIRATORY_TRACT | 3 refills | Status: DC

## 2018-06-20 NOTE — Assessment & Plan Note (Signed)
IMproved control on current dose of amitryptiline and using alprazolam TID.

## 2018-06-20 NOTE — Assessment & Plan Note (Signed)
Alprazolam as needed 

## 2018-06-20 NOTE — Assessment & Plan Note (Signed)
Good control on current regimen. 

## 2018-06-20 NOTE — Progress Notes (Signed)
Subjective:    Patient ID: Sandra Ferrell, female    DOB: [birth year], 57 y.o.   MRN: [id number]  HPI The patient presents for annual medicare wellness, complete physical and review of chronic health problems. He/She also has the following acute concerns today:  I have personally reviewed the Medicare Annual Wellness questionnaire and have noted 1. The patient's medical and social history 2. Their use of alcohol, tobacco or illicit drugs 3. Their current medications and supplements 4. The patient's functional ability including ADL's, fall risks, home safety risks and hearing or visual             impairment. 5. Diet and physical activities 6. Evidence for depression or mood disorders 7.         Updated provider list Cognitive evaluation was performed and recorded on pt medicare questionnaire form. The patients weight, height, BMI and visual acuity have been recorded in the chart  I have made referrals, counseling and provided education to the patient based review of the above and I have provided the pt with a written personalized care plan for preventive services.   Documentation of this information was scanned into the electronic record under the media tab.   Social History /Family History/Past Medical History reviewed in detail and updated in EMR if needed.  Advance directives and end of life planning reviewed in detail with patient and documented in EMR. Patient given handout on advance care directives if needed. HCPOA and living will updated if needed.  Using cane for balance. Fall Risk  06/20/2018 09/24/2016 09/13/2015  Falls in the past year? Yes Yes Yes  Number falls in past yr: 1 2 or more -  Injury with Fall? No Yes No  Risk Factor Category  - High Fall Risk -  Risk for fall due to : - Impaired balance/gait -  Follow up - Falls evaluation completed;Falls prevention discussed -    Hearing Screening   Method: Audiometry   125Hz  250Hz  500Hz  1000Hz  2000Hz  3000Hz  4000Hz  6000Hz   8000Hz   Right ear:   20 20 20  20     Left ear:   20 25 20  20     Vision Screening Comments: Eye Exam at [facility] in [place] 2019  Diabetes:   Good control on current regimen! Lab Results  Component Value Date   HGBA1C 6.5 06/18/2018  Using medications without difficulties: Hypoglycemic episodes:none Hyperglycemic episodes:none Feet problems:none, cracks on heel s at times. Blood Sugars averaging: FBS 100s eye exam within last year: yes  Elevated Cholesterol:  LDL at goal on current regimen at last check Lab Results  Component Value Date   CHOL 134 02/26/2018   HDL 46.60 02/26/2018   LDLCALC 72 02/26/2018   LDLDIRECT 59.4 09/26/2009   TRIG 79.0 02/26/2018   CHOLHDL 3 02/26/2018  Using medications without problems: Muscle aches:  Diet compliance: good Exercise: moderate.. 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 Other complaints:  Wt Readings from Last 3 Encounters:  06/20/18 226 lb 4 oz (102.6 kg)  05/19/18 228 lb 8 oz (103.6 kg)  03/17/18 226 lb 4 oz (102.6 kg)    Hypertension:    Good control on lasix prn. Usually taking daily. BP Readings from Last 3 Encounters:  06/20/18 120/60  05/19/18 134/70  03/17/18 120/64  Using medication without problems or lightheadedness:  None Chest pain with exertion:none Edema:occ Short of breath:none Average home BPs: Other issues:  MDD PHQ9 today: 5 .. Well controlled.  Using alprazolam 1 tab  3 times a day, but tries to do without at times.  Review of Systems  Constitutional: Negative for fatigue and fever.  HENT: Negative for congestion.   Eyes: Negative for pain.  Respiratory: Negative for cough and shortness of breath.   Cardiovascular: Negative for chest pain, palpitations and leg swelling.  Gastrointestinal: Negative for abdominal pain.  Genitourinary: Negative for dysuria and vaginal bleeding.  Musculoskeletal: Negative for back pain.  Neurological: Negative for syncope, light-headedness and  headaches.  Psychiatric/Behavioral: Negative for dysphoric mood.       Objective:   Physical Exam  Constitutional: Vital signs are normal. She appears well-developed and well-nourished. She is cooperative.  Non-toxic appearance. She does not appear ill. No distress.  HENT:  Head: Normocephalic.  Right Ear: Hearing, tympanic membrane, external ear and ear canal normal.  Left Ear: Hearing, tympanic membrane, external ear and ear canal normal.  Nose: Nose normal.  Eyes: Pupils are equal, round, and reactive to light. Conjunctivae, EOM and lids are normal. Lids are everted and swept, no foreign bodies found.  Neck: Trachea normal and normal range of motion. Neck supple. Carotid bruit is not present. No thyroid mass and no thyromegaly present.  Cardiovascular: Normal rate, regular rhythm, S1 normal, S2 normal, normal heart sounds and intact distal pulses. Exam reveals no gallop.  No murmur heard. Pulmonary/Chest: Effort normal and breath sounds normal. No respiratory distress. She has no wheezes. She has no rhonchi. She has no rales.  Abdominal: Soft. Normal appearance and bowel sounds are normal. She exhibits no distension, no fluid wave, no abdominal bruit and no mass. There is no hepatosplenomegaly. There is no tenderness. There is no rebound, no guarding and no CVA tenderness. No hernia.  Lymphadenopathy:    She has no cervical adenopathy.    She has no axillary adenopathy.  Neurological: She is alert. She has normal strength. No cranial nerve deficit or sensory deficit.  Skin: Skin is warm, dry and intact. No rash noted.  Psychiatric: Her speech is normal and behavior is normal. Judgment normal. Her mood appears not anxious. Cognition and memory are normal. She does not exhibit a depressed mood.      Diabetic foot exam: Normal inspection No skin breakdown No calluses  Normal DP pulses Normal sensation to light touch and monofilament Nails normal     Assessment & Plan:  The  patient's preventative maintenance and recommended screening tests for an annual wellness exam were reviewed in full today. Brought up to date unless services declined.  Counselled on the importance of diet, exercise, and its role in overall health and mortality. The patient's FH and SH was reviewed, including their home life, tobacco status, and drug and alcohol status.   Vaccines:  Utodate except for flu. Pap/DVE:  nml pap 2015.Marland Kitchen Repeat due in 5 years. Mammo: 2016.Marland Kitchen Repeat due Bone Density: nml 2012, repeat age 15 Colon: nml colonoscopy 2012 dr. Deatra Ina, repeat in 10 years. Smoking Status: 35 plus pack year history, trying to decrease cigarettes ETOH/ drug use: none Hep C:  done HIV screen:  refused

## 2018-06-20 NOTE — Assessment & Plan Note (Signed)
Stable control per pt on symbicort , pt contemplative of smpking cessation... Refuses medicaiton to treat wt this time.

## 2018-06-20 NOTE — Patient Instructions (Addendum)
Can add flonase for allergies.  Work on [employer]. Try high dose of Symbicort.  Quit smoking. Call if you decide you are interested in trial of Chantix.

## 2018-06-20 NOTE — Assessment & Plan Note (Signed)
Well controlled. Continue current medication.  

## 2018-06-20 NOTE — Assessment & Plan Note (Signed)
Well controlled. Continue current medication. Encouraged exercise, weight loss, healthy eating habits.  

## 2018-06-28 ENCOUNTER — Emergency Department (HOSPITAL_COMMUNITY): Payer: Medicare Other

## 2018-06-28 ENCOUNTER — Encounter (HOSPITAL_COMMUNITY): Payer: Self-pay | Admitting: Emergency Medicine

## 2018-06-28 ENCOUNTER — Inpatient Hospital Stay (HOSPITAL_COMMUNITY)
Admission: EM | Admit: 2018-06-28 | Discharge: 2018-07-03 | DRG: 481 | Disposition: A | Discharge status: Skilled Nursing Facility | Payer: Medicare Other | Attending: Internal Medicine | Admitting: Internal Medicine

## 2018-06-28 DIAGNOSIS — Z419 Encounter for procedure for purposes other than remedying health state, unspecified: Secondary | ICD-10-CM

## 2018-06-28 DIAGNOSIS — K5900 Constipation, unspecified: Secondary | ICD-10-CM | POA: Diagnosis not present

## 2018-06-28 DIAGNOSIS — I10 Essential (primary) hypertension: Secondary | ICD-10-CM | POA: Diagnosis present

## 2018-06-28 DIAGNOSIS — Z880 Allergy status to penicillin: Secondary | ICD-10-CM

## 2018-06-28 DIAGNOSIS — J449 Chronic obstructive pulmonary disease, unspecified: Secondary | ICD-10-CM | POA: Diagnosis present

## 2018-06-28 DIAGNOSIS — K219 Gastro-esophageal reflux disease without esophagitis: Secondary | ICD-10-CM | POA: Diagnosis present

## 2018-06-28 DIAGNOSIS — Z79899 Other long term (current) drug therapy: Secondary | ICD-10-CM

## 2018-06-28 DIAGNOSIS — F17210 Nicotine dependence, cigarettes, uncomplicated: Secondary | ICD-10-CM | POA: Diagnosis present

## 2018-06-28 DIAGNOSIS — E119 Type 2 diabetes mellitus without complications: Secondary | ICD-10-CM

## 2018-06-28 DIAGNOSIS — S72352A Displaced comminuted fracture of shaft of left femur, initial encounter for closed fracture: Secondary | ICD-10-CM | POA: Diagnosis not present

## 2018-06-28 DIAGNOSIS — Z794 Long term (current) use of insulin: Secondary | ICD-10-CM

## 2018-06-28 DIAGNOSIS — Z888 Allergy status to other drugs, medicaments and biological substances status: Secondary | ICD-10-CM

## 2018-06-28 DIAGNOSIS — M25552 Pain in left hip: Secondary | ICD-10-CM | POA: Diagnosis not present

## 2018-06-28 DIAGNOSIS — Z01811 Encounter for preprocedural respiratory examination: Secondary | ICD-10-CM

## 2018-06-28 DIAGNOSIS — Z886 Allergy status to analgesic agent status: Secondary | ICD-10-CM

## 2018-06-28 DIAGNOSIS — E1159 Type 2 diabetes mellitus with other circulatory complications: Secondary | ICD-10-CM | POA: Diagnosis present

## 2018-06-28 DIAGNOSIS — S79912A Unspecified injury of left hip, initial encounter: Secondary | ICD-10-CM | POA: Diagnosis not present

## 2018-06-28 DIAGNOSIS — Y92000 Kitchen of unspecified non-institutional (private) residence as  the place of occurrence of the external cause: Secondary | ICD-10-CM

## 2018-06-28 DIAGNOSIS — X501XXA Overexertion from prolonged static or awkward postures, initial encounter: Secondary | ICD-10-CM

## 2018-06-28 DIAGNOSIS — I444 Left anterior fascicular block: Secondary | ICD-10-CM | POA: Diagnosis present

## 2018-06-28 DIAGNOSIS — Z7951 Long term (current) use of inhaled steroids: Secondary | ICD-10-CM

## 2018-06-28 DIAGNOSIS — Z981 Arthrodesis status: Secondary | ICD-10-CM

## 2018-06-28 DIAGNOSIS — G4730 Sleep apnea, unspecified: Secondary | ICD-10-CM | POA: Diagnosis present

## 2018-06-28 DIAGNOSIS — Z09 Encounter for follow-up examination after completed treatment for conditions other than malignant neoplasm: Secondary | ICD-10-CM

## 2018-06-28 DIAGNOSIS — Z8781 Personal history of (healed) traumatic fracture: Secondary | ICD-10-CM | POA: Diagnosis present

## 2018-06-28 DIAGNOSIS — S7290XA Unspecified fracture of unspecified femur, initial encounter for closed fracture: Secondary | ICD-10-CM | POA: Diagnosis present

## 2018-06-28 DIAGNOSIS — S7292XA Unspecified fracture of left femur, initial encounter for closed fracture: Secondary | ICD-10-CM

## 2018-06-28 DIAGNOSIS — Z01818 Encounter for other preprocedural examination: Secondary | ICD-10-CM | POA: Diagnosis not present

## 2018-06-28 DIAGNOSIS — I152 Hypertension secondary to endocrine disorders: Secondary | ICD-10-CM | POA: Diagnosis present

## 2018-06-28 DIAGNOSIS — I110 Hypertensive heart disease with heart failure: Secondary | ICD-10-CM | POA: Diagnosis present

## 2018-06-28 DIAGNOSIS — G894 Chronic pain syndrome: Secondary | ICD-10-CM | POA: Diagnosis present

## 2018-06-28 DIAGNOSIS — D649 Anemia, unspecified: Secondary | ICD-10-CM | POA: Diagnosis present

## 2018-06-28 DIAGNOSIS — S72302A Unspecified fracture of shaft of left femur, initial encounter for closed fracture: Secondary | ICD-10-CM | POA: Diagnosis not present

## 2018-06-28 DIAGNOSIS — S72492A Other fracture of lower end of left femur, initial encounter for closed fracture: Secondary | ICD-10-CM

## 2018-06-28 DIAGNOSIS — Z6841 Body Mass Index (BMI) 40.0 and over, adult: Secondary | ICD-10-CM

## 2018-06-28 DIAGNOSIS — I5032 Chronic diastolic (congestive) heart failure: Secondary | ICD-10-CM | POA: Diagnosis present

## 2018-06-28 DIAGNOSIS — S72452A Displaced supracondylar fracture without intracondylar extension of lower end of left femur, initial encounter for closed fracture: Principal | ICD-10-CM | POA: Diagnosis present

## 2018-06-28 DIAGNOSIS — F419 Anxiety disorder, unspecified: Secondary | ICD-10-CM | POA: Diagnosis present

## 2018-06-28 DIAGNOSIS — E785 Hyperlipidemia, unspecified: Secondary | ICD-10-CM | POA: Diagnosis present

## 2018-06-28 DIAGNOSIS — W010XXA Fall on same level from slipping, tripping and stumbling without subsequent striking against object, initial encounter: Secondary | ICD-10-CM | POA: Diagnosis present

## 2018-06-28 DIAGNOSIS — M797 Fibromyalgia: Secondary | ICD-10-CM | POA: Diagnosis present

## 2018-06-28 DIAGNOSIS — R000 Tachycardia, unspecified: Secondary | ICD-10-CM | POA: Diagnosis not present

## 2018-06-28 DIAGNOSIS — D509 Iron deficiency anemia, unspecified: Secondary | ICD-10-CM

## 2018-06-28 LAB — TYPE AND SCREEN
ABO/RH(D): A POS
ANTIBODY SCREEN: NEGATIVE

## 2018-06-28 LAB — CBC WITH DIFFERENTIAL/PLATELET
ABS IMMATURE GRANULOCYTES: 0.1 10*3/uL (ref 0.0–0.1)
BASOS ABS: 0 10*3/uL (ref 0.0–0.1)
Basophils Relative: 0 %
Eosinophils Absolute: 0.2 10*3/uL (ref 0.0–0.7)
Eosinophils Relative: 1 %
HCT: 36.6 % (ref 36.0–46.0)
HEMOGLOBIN: 11.5 g/dL — AB (ref 12.0–15.0)
IMMATURE GRANULOCYTES: 1 %
LYMPHS PCT: 10 %
Lymphs Abs: 1.6 10*3/uL (ref 0.7–4.0)
MCH: 30.3 pg (ref 26.0–34.0)
MCHC: 31.4 g/dL (ref 30.0–36.0)
MCV: 96.6 fL (ref 78.0–100.0)
MONO ABS: 1.1 10*3/uL — AB (ref 0.1–1.0)
MONOS PCT: 7 %
NEUTROS ABS: 13.1 10*3/uL — AB (ref 1.7–7.7)
NEUTROS PCT: 81 %
Platelets: 348 10*3/uL (ref 150–400)
RBC: 3.79 MIL/uL — ABNORMAL LOW (ref 3.87–5.11)
RDW: 14.6 % (ref 11.5–15.5)
WBC: 16.1 10*3/uL — ABNORMAL HIGH (ref 4.0–10.5)

## 2018-06-28 LAB — BASIC METABOLIC PANEL
Anion gap: 14 (ref 5–15)
BUN: 13 mg/dL (ref 6–20)
CHLORIDE: 104 mmol/L (ref 98–111)
CO2: 22 mmol/L (ref 22–32)
Calcium: 8.4 mg/dL — ABNORMAL LOW (ref 8.9–10.3)
Creatinine, Ser: 0.92 mg/dL (ref 0.44–1.00)
GFR calc Af Amer: >60 mL/min (ref >60)
Glucose, Bld: 154 mg/dL — ABNORMAL HIGH (ref 70–99)
POTASSIUM: 4.2 mmol/L (ref 3.5–5.1)
SODIUM: 140 mmol/L (ref 135–145)

## 2018-06-28 MED ORDER — FENTANYL CITRATE (PF) 100 MCG/2ML IJ SOLN
50.0000 ug | INTRAMUSCULAR | Status: DC | PRN
  Administered 2018-06-28 – 2018-06-29 (×4): 50 ug via INTRAVENOUS
  Filled 2018-06-28 (×4): qty 2
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
## 2018-06-29 ENCOUNTER — Other Ambulatory Visit: Payer: Self-pay

## 2018-06-29 ENCOUNTER — Encounter (HOSPITAL_COMMUNITY)
Admission: EM | Disposition: A | Discharge status: Skilled Nursing Facility | Payer: Self-pay | Source: Home / Self Care | Attending: Internal Medicine

## 2018-06-29 ENCOUNTER — Inpatient Hospital Stay (HOSPITAL_COMMUNITY): Payer: Medicare Other | Admitting: Certified Registered"

## 2018-06-29 ENCOUNTER — Inpatient Hospital Stay (HOSPITAL_COMMUNITY): Payer: Medicare Other

## 2018-06-29 ENCOUNTER — Encounter (HOSPITAL_COMMUNITY): Payer: Self-pay | Admitting: Surgery

## 2018-06-29 DIAGNOSIS — G4730 Sleep apnea, unspecified: Secondary | ICD-10-CM | POA: Diagnosis not present

## 2018-06-29 DIAGNOSIS — X501XXA Overexertion from prolonged static or awkward postures, initial encounter: Secondary | ICD-10-CM | POA: Diagnosis not present

## 2018-06-29 DIAGNOSIS — K5900 Constipation, unspecified: Secondary | ICD-10-CM | POA: Diagnosis not present

## 2018-06-29 DIAGNOSIS — W19XXXD Unspecified fall, subsequent encounter: Secondary | ICD-10-CM | POA: Diagnosis not present

## 2018-06-29 DIAGNOSIS — M797 Fibromyalgia: Secondary | ICD-10-CM | POA: Diagnosis not present

## 2018-06-29 DIAGNOSIS — E119 Type 2 diabetes mellitus without complications: Secondary | ICD-10-CM | POA: Diagnosis not present

## 2018-06-29 DIAGNOSIS — Y92000 Kitchen of unspecified non-institutional (private) residence as  the place of occurrence of the external cause: Secondary | ICD-10-CM | POA: Diagnosis not present

## 2018-06-29 DIAGNOSIS — J45909 Unspecified asthma, uncomplicated: Secondary | ICD-10-CM | POA: Diagnosis not present

## 2018-06-29 DIAGNOSIS — M2550 Pain in unspecified joint: Secondary | ICD-10-CM | POA: Diagnosis not present

## 2018-06-29 DIAGNOSIS — S79929A Unspecified injury of unspecified thigh, initial encounter: Secondary | ICD-10-CM | POA: Diagnosis not present

## 2018-06-29 DIAGNOSIS — D509 Iron deficiency anemia, unspecified: Secondary | ICD-10-CM

## 2018-06-29 DIAGNOSIS — D649 Anemia, unspecified: Secondary | ICD-10-CM

## 2018-06-29 DIAGNOSIS — W19XXXA Unspecified fall, initial encounter: Secondary | ICD-10-CM | POA: Diagnosis not present

## 2018-06-29 DIAGNOSIS — S72452A Displaced supracondylar fracture without intracondylar extension of lower end of left femur, initial encounter for closed fracture: Secondary | ICD-10-CM | POA: Diagnosis not present

## 2018-06-29 DIAGNOSIS — S72402A Unspecified fracture of lower end of left femur, initial encounter for closed fracture: Secondary | ICD-10-CM | POA: Diagnosis not present

## 2018-06-29 DIAGNOSIS — Z794 Long term (current) use of insulin: Secondary | ICD-10-CM | POA: Diagnosis not present

## 2018-06-29 DIAGNOSIS — I5032 Chronic diastolic (congestive) heart failure: Secondary | ICD-10-CM | POA: Diagnosis not present

## 2018-06-29 DIAGNOSIS — Z888 Allergy status to other drugs, medicaments and biological substances status: Secondary | ICD-10-CM | POA: Diagnosis not present

## 2018-06-29 DIAGNOSIS — F17210 Nicotine dependence, cigarettes, uncomplicated: Secondary | ICD-10-CM | POA: Diagnosis present

## 2018-06-29 DIAGNOSIS — E785 Hyperlipidemia, unspecified: Secondary | ICD-10-CM | POA: Diagnosis not present

## 2018-06-29 DIAGNOSIS — Z886 Allergy status to analgesic agent status: Secondary | ICD-10-CM | POA: Diagnosis not present

## 2018-06-29 DIAGNOSIS — Z6841 Body Mass Index (BMI) 40.0 and over, adult: Secondary | ICD-10-CM | POA: Diagnosis not present

## 2018-06-29 DIAGNOSIS — S72142D Displaced intertrochanteric fracture of left femur, subsequent encounter for closed fracture with routine healing: Secondary | ICD-10-CM | POA: Diagnosis not present

## 2018-06-29 DIAGNOSIS — I10 Essential (primary) hypertension: Secondary | ICD-10-CM | POA: Diagnosis not present

## 2018-06-29 DIAGNOSIS — Z880 Allergy status to penicillin: Secondary | ICD-10-CM | POA: Diagnosis not present

## 2018-06-29 DIAGNOSIS — K588 Other irritable bowel syndrome: Secondary | ICD-10-CM | POA: Diagnosis not present

## 2018-06-29 DIAGNOSIS — I509 Heart failure, unspecified: Secondary | ICD-10-CM | POA: Diagnosis not present

## 2018-06-29 DIAGNOSIS — G894 Chronic pain syndrome: Secondary | ICD-10-CM | POA: Diagnosis not present

## 2018-06-29 DIAGNOSIS — J449 Chronic obstructive pulmonary disease, unspecified: Secondary | ICD-10-CM | POA: Diagnosis not present

## 2018-06-29 DIAGNOSIS — Z8781 Personal history of (healed) traumatic fracture: Secondary | ICD-10-CM | POA: Diagnosis present

## 2018-06-29 DIAGNOSIS — R2689 Other abnormalities of gait and mobility: Secondary | ICD-10-CM | POA: Diagnosis not present

## 2018-06-29 DIAGNOSIS — Z79899 Other long term (current) drug therapy: Secondary | ICD-10-CM | POA: Diagnosis not present

## 2018-06-29 DIAGNOSIS — Z7401 Bed confinement status: Secondary | ICD-10-CM | POA: Diagnosis not present

## 2018-06-29 DIAGNOSIS — S7290XA Unspecified fracture of unspecified femur, initial encounter for closed fracture: Secondary | ICD-10-CM | POA: Diagnosis present

## 2018-06-29 DIAGNOSIS — M6281 Muscle weakness (generalized): Secondary | ICD-10-CM | POA: Diagnosis not present

## 2018-06-29 DIAGNOSIS — I110 Hypertensive heart disease with heart failure: Secondary | ICD-10-CM | POA: Diagnosis not present

## 2018-06-29 DIAGNOSIS — S72492S Other fracture of lower end of left femur, sequela: Secondary | ICD-10-CM | POA: Diagnosis not present

## 2018-06-29 DIAGNOSIS — W010XXA Fall on same level from slipping, tripping and stumbling without subsequent striking against object, initial encounter: Secondary | ICD-10-CM | POA: Diagnosis present

## 2018-06-29 DIAGNOSIS — Z7951 Long term (current) use of inhaled steroids: Secondary | ICD-10-CM | POA: Diagnosis not present

## 2018-06-29 DIAGNOSIS — Z981 Arthrodesis status: Secondary | ICD-10-CM | POA: Diagnosis not present

## 2018-06-29 DIAGNOSIS — K219 Gastro-esophageal reflux disease without esophagitis: Secondary | ICD-10-CM | POA: Diagnosis not present

## 2018-06-29 DIAGNOSIS — S72392D Other fracture of shaft of left femur, subsequent encounter for closed fracture with routine healing: Secondary | ICD-10-CM | POA: Diagnosis not present

## 2018-06-29 DIAGNOSIS — F419 Anxiety disorder, unspecified: Secondary | ICD-10-CM | POA: Diagnosis present

## 2018-06-29 DIAGNOSIS — I444 Left anterior fascicular block: Secondary | ICD-10-CM | POA: Diagnosis not present

## 2018-06-29 DIAGNOSIS — S72492A Other fracture of lower end of left femur, initial encounter for closed fracture: Secondary | ICD-10-CM | POA: Diagnosis not present

## 2018-06-29 HISTORY — PX: ORIF FEMUR FRACTURE: SHX2119

## 2018-06-29 LAB — CBC
HCT: 36.3 % (ref 36.0–46.0)
Hemoglobin: 11.4 g/dL — ABNORMAL LOW (ref 12.0–15.0)
MCH: 29.9 pg (ref 26.0–34.0)
MCHC: 31.4 g/dL (ref 30.0–36.0)
MCV: 95.3 fL (ref 78.0–100.0)
PLATELETS: 357 10*3/uL (ref 150–400)
RBC: 3.81 MIL/uL — AB (ref 3.87–5.11)
RDW: 14.7 % (ref 11.5–15.5)
WBC: 13.1 10*3/uL — ABNORMAL HIGH (ref 4.0–10.5)

## 2018-06-29 LAB — APTT: aPTT: 53 seconds — ABNORMAL HIGH (ref 24–36)

## 2018-06-29 LAB — GLUCOSE, CAPILLARY
GLUCOSE-CAPILLARY: 128 mg/dL — AB (ref 70–99)
GLUCOSE-CAPILLARY: 123 mg/dL — AB (ref 70–99)
Glucose-Capillary: 140 mg/dL — ABNORMAL HIGH (ref 70–99)
Glucose-Capillary: 142 mg/dL — ABNORMAL HIGH (ref 70–99)
Glucose-Capillary: 183 mg/dL — ABNORMAL HIGH (ref 70–99)
Glucose-Capillary: 157 mg/dL — ABNORMAL HIGH (ref 70–99)

## 2018-06-29 LAB — COMPREHENSIVE METABOLIC PANEL
ALT: 15 U/L (ref 0–44)
AST: 22 U/L (ref 15–41)
Albumin: 3 g/dL — ABNORMAL LOW (ref 3.5–5.0)
Alkaline Phosphatase: 70 U/L (ref 38–126)
Anion gap: 9 (ref 5–15)
BUN: 10 mg/dL (ref 6–20)
CHLORIDE: 107 mmol/L (ref 98–111)
CO2: 24 mmol/L (ref 22–32)
CREATININE: 0.71 mg/dL (ref 0.44–1.00)
Calcium: 8.2 mg/dL — ABNORMAL LOW (ref 8.9–10.3)
Glucose, Bld: 134 mg/dL — ABNORMAL HIGH (ref 70–99)
Potassium: 3.7 mmol/L (ref 3.5–5.1)
SODIUM: 140 mmol/L (ref 135–145)
Total Bilirubin: 0.4 mg/dL (ref 0.3–1.2)
Total Protein: 6.2 g/dL — ABNORMAL LOW (ref 6.5–8.1)

## 2018-06-29 LAB — PROTIME-INR
INR: 0.98
PROTHROMBIN TIME: 12.9 s (ref 11.4–15.2)

## 2018-06-29 LAB — SURGICAL PCR SCREEN
MRSA, PCR: NEGATIVE
Staphylococcus aureus: NEGATIVE

## 2018-06-29 LAB — CBG MONITORING, ED: GLUCOSE-CAPILLARY: 173 mg/dL — AB (ref 70–99)

## 2018-06-29 LAB — HIV ANTIBODY (ROUTINE TESTING W REFLEX): HIV SCREEN 4TH GENERATION: NONREACTIVE

## 2018-06-29 SURGERY — OPEN REDUCTION INTERNAL FIXATION (ORIF) DISTAL FEMUR FRACTURE
Anesthesia: General | Laterality: Left

## 2018-06-29 MED ORDER — ACETAMINOPHEN 500 MG PO TABS
500.0000 mg | ORAL_TABLET | Freq: Four times a day (QID) | ORAL | Status: AC
  Administered 2018-06-29 – 2018-06-30 (×4): 500 mg via ORAL
  Filled 2018-06-29 (×4): qty 1

## 2018-06-29 MED ORDER — 0.9 % SODIUM CHLORIDE (POUR BTL) OPTIME
TOPICAL | Status: DC | PRN
  Administered 2018-06-29: 1000 mL

## 2018-06-29 MED ORDER — TRAMADOL HCL 50 MG PO TABS
50.0000 mg | ORAL_TABLET | Freq: Four times a day (QID) | ORAL | Status: DC
  Administered 2018-06-29 – 2018-07-03 (×16): 50 mg via ORAL
  Filled 2018-06-29 (×16): qty 1

## 2018-06-29 MED ORDER — MIDAZOLAM HCL 2 MG/2ML IJ SOLN
INTRAMUSCULAR | Status: AC
  Filled 2018-06-29: qty 2

## 2018-06-29 MED ORDER — BUPIVACAINE-EPINEPHRINE (PF) 0.5% -1:200000 IJ SOLN
INTRAMUSCULAR | Status: AC
  Filled 2018-06-29: qty 30

## 2018-06-29 MED ORDER — ALBUTEROL SULFATE HFA 108 (90 BASE) MCG/ACT IN AERS: 1.0000 | INHALATION_SPRAY | Freq: Four times a day (QID) | RESPIRATORY_TRACT | Status: DC | PRN

## 2018-06-29 MED ORDER — HYDROCODONE-ACETAMINOPHEN 5-325 MG PO TABS
1.0000 | ORAL_TABLET | ORAL | Status: DC | PRN
  Administered 2018-06-29 – 2018-06-30 (×2): 2 via ORAL
  Administered 2018-06-30: 1 via ORAL
  Administered 2018-06-30 – 2018-07-03 (×5): 2 via ORAL
  Filled 2018-06-29 (×8): qty 2

## 2018-06-29 MED ORDER — ALPRAZOLAM 0.5 MG PO TABS
0.5000 mg | ORAL_TABLET | Freq: Three times a day (TID) | ORAL | Status: DC
  Administered 2018-06-29 – 2018-07-03 (×11): 0.5 mg via ORAL
  Filled 2018-06-29 (×11): qty 1

## 2018-06-29 MED ORDER — DEXAMETHASONE SODIUM PHOSPHATE 10 MG/ML IJ SOLN
INTRAMUSCULAR | Status: DC | PRN
  Administered 2018-06-29: 5 mg via INTRAVENOUS

## 2018-06-29 MED ORDER — FENTANYL CITRATE (PF) 250 MCG/5ML IJ SOLN
INTRAMUSCULAR | Status: DC | PRN
  Administered 2018-06-29 (×2): 50 ug via INTRAVENOUS
  Administered 2018-06-29: 150 ug via INTRAVENOUS

## 2018-06-29 MED ORDER — LIDOCAINE 2% (20 MG/ML) 5 ML SYRINGE
INTRAMUSCULAR | Status: DC | PRN
  Administered 2018-06-29: 100 mg via INTRAVENOUS

## 2018-06-29 MED ORDER — HYDROMORPHONE HCL 1 MG/ML IJ SOLN
0.5000 mg | INTRAMUSCULAR | Status: DC | PRN
  Administered 2018-06-29: 0.5 mg via INTRAVENOUS
  Filled 2018-06-29: qty 1

## 2018-06-29 MED ORDER — MAGNESIUM HYDROXIDE 400 MG/5ML PO SUSP: 30.0000 mL | Freq: Every day | ORAL | Status: DC | PRN

## 2018-06-29 MED ORDER — FENTANYL CITRATE (PF) 250 MCG/5ML IJ SOLN
INTRAMUSCULAR | Status: AC
  Filled 2018-06-29: qty 5

## 2018-06-29 MED ORDER — MEPERIDINE HCL 50 MG/ML IJ SOLN: 6.2500 mg | INTRAMUSCULAR | Status: DC | PRN

## 2018-06-29 MED ORDER — ROCURONIUM BROMIDE 10 MG/ML (PF) SYRINGE
PREFILLED_SYRINGE | INTRAVENOUS | Status: DC | PRN
  Administered 2018-06-29: 50 mg via INTRAVENOUS
  Administered 2018-06-29: 20 mg via INTRAVENOUS
  Administered 2018-06-29: 10 mg via INTRAVENOUS

## 2018-06-29 MED ORDER — NICOTINE 14 MG/24HR TD PT24
14.0000 mg | MEDICATED_PATCH | Freq: Every day | TRANSDERMAL | Status: DC
  Administered 2018-06-30 – 2018-07-03 (×4): 14 mg via TRANSDERMAL
  Filled 2018-06-29 (×4): qty 1

## 2018-06-29 MED ORDER — ONDANSETRON HCL 4 MG/2ML IJ SOLN
4.0000 mg | Freq: Once | INTRAMUSCULAR | Status: AC | PRN
  Administered 2018-06-29: 4 mg via INTRAVENOUS

## 2018-06-29 MED ORDER — POVIDONE-IODINE 10 % EX SWAB: 2.0000 "application " | Freq: Once | CUTANEOUS | Status: DC

## 2018-06-29 MED ORDER — ONDANSETRON HCL 4 MG/2ML IJ SOLN
INTRAMUSCULAR | Status: DC | PRN
  Administered 2018-06-29: 4 mg via INTRAVENOUS

## 2018-06-29 MED ORDER — HYDROMORPHONE HCL 1 MG/ML IJ SOLN
INTRAMUSCULAR | Status: AC
  Filled 2018-06-29: qty 1

## 2018-06-29 MED ORDER — PROPOFOL 10 MG/ML IV BOLUS
INTRAVENOUS | Status: AC
  Filled 2018-06-29: qty 20

## 2018-06-29 MED ORDER — INSULIN ASPART 100 UNIT/ML ~~LOC~~ SOLN
0.0000 [IU] | SUBCUTANEOUS | Status: DC
  Administered 2018-06-29: 2 [IU] via SUBCUTANEOUS
  Administered 2018-06-29: 1 [IU] via SUBCUTANEOUS
  Administered 2018-06-29 (×2): 2 [IU] via SUBCUTANEOUS
  Administered 2018-06-29 – 2018-06-30 (×4): 1 [IU] via SUBCUTANEOUS
  Administered 2018-07-01: 2 [IU] via SUBCUTANEOUS
  Administered 2018-07-02: 1 [IU] via SUBCUTANEOUS
  Administered 2018-07-02: 2 [IU] via SUBCUTANEOUS
  Administered 2018-07-02: 1 [IU] via SUBCUTANEOUS
  Filled 2018-06-29: qty 1

## 2018-06-29 MED ORDER — METOCLOPRAMIDE HCL 5 MG PO TABS
5.0000 mg | ORAL_TABLET | Freq: Three times a day (TID) | ORAL | Status: DC | PRN
  Administered 2018-06-30 – 2018-07-01 (×2): 5 mg via ORAL
  Filled 2018-06-29 (×2): qty 1

## 2018-06-29 MED ORDER — CLINDAMYCIN PHOSPHATE 600 MG/50ML IV SOLN
600.0000 mg | Freq: Four times a day (QID) | INTRAVENOUS | Status: AC
  Administered 2018-06-29 – 2018-06-30 (×3): 600 mg via INTRAVENOUS
  Filled 2018-06-29 (×3): qty 50

## 2018-06-29 MED ORDER — GEMFIBROZIL 600 MG PO TABS
600.0000 mg | ORAL_TABLET | Freq: Two times a day (BID) | ORAL | Status: DC
  Administered 2018-06-29 – 2018-07-03 (×8): 600 mg via ORAL
  Filled 2018-06-29 (×9): qty 1

## 2018-06-29 MED ORDER — LIDOCAINE 2% (20 MG/ML) 5 ML SYRINGE
INTRAMUSCULAR | Status: AC
  Filled 2018-06-29: qty 5

## 2018-06-29 MED ORDER — ONDANSETRON HCL 4 MG/2ML IJ SOLN
4.0000 mg | Freq: Four times a day (QID) | INTRAMUSCULAR | Status: DC | PRN
  Administered 2018-06-29 – 2018-07-03 (×2): 4 mg via INTRAVENOUS
  Filled 2018-06-29 (×2): qty 2

## 2018-06-29 MED ORDER — SODIUM CHLORIDE 0.9 % IV SOLN
INTRAVENOUS | Status: DC
  Administered 2018-06-29: 02:00:00 via INTRAVENOUS

## 2018-06-29 MED ORDER — METOCLOPRAMIDE HCL 5 MG/ML IJ SOLN: 5.0000 mg | Freq: Three times a day (TID) | INTRAMUSCULAR | Status: DC | PRN

## 2018-06-29 MED ORDER — CHLORHEXIDINE GLUCONATE 4 % EX LIQD
60.0000 mL | Freq: Once | CUTANEOUS | Status: AC
  Administered 2018-06-29: 4 via TOPICAL
  Filled 2018-06-29: qty 60

## 2018-06-29 MED ORDER — ENOXAPARIN SODIUM 40 MG/0.4ML ~~LOC~~ SOLN: 40.0000 mg | SUBCUTANEOUS | 0 refills | Status: DC

## 2018-06-29 MED ORDER — LACTATED RINGERS IV SOLN
INTRAVENOUS | Status: DC | PRN
  Administered 2018-06-29 (×2): via INTRAVENOUS

## 2018-06-29 MED ORDER — SUGAMMADEX SODIUM 500 MG/5ML IV SOLN
INTRAVENOUS | Status: AC
  Filled 2018-06-29: qty 5

## 2018-06-29 MED ORDER — MIDAZOLAM HCL 5 MG/5ML IJ SOLN
INTRAMUSCULAR | Status: DC | PRN
  Administered 2018-06-29: 1 mg via INTRAVENOUS
  Administered 2018-06-29: 2 mg via INTRAVENOUS

## 2018-06-29 MED ORDER — ROCURONIUM BROMIDE 50 MG/5ML IV SOSY
PREFILLED_SYRINGE | INTRAVENOUS | Status: AC
  Filled 2018-06-29: qty 5

## 2018-06-29 MED ORDER — ACETAMINOPHEN 325 MG PO TABS: 650.0000 mg | ORAL_TABLET | Freq: Four times a day (QID) | ORAL | Status: DC | PRN

## 2018-06-29 MED ORDER — DOCUSATE SODIUM 100 MG PO CAPS
100.0000 mg | ORAL_CAPSULE | Freq: Two times a day (BID) | ORAL | Status: DC
  Administered 2018-06-29 – 2018-07-03 (×8): 100 mg via ORAL
  Filled 2018-06-29 (×8): qty 1

## 2018-06-29 MED ORDER — ACETAMINOPHEN 650 MG RE SUPP: 650.0000 mg | Freq: Four times a day (QID) | RECTAL | Status: DC | PRN

## 2018-06-29 MED ORDER — FUROSEMIDE 20 MG PO TABS
20.0000 mg | ORAL_TABLET | Freq: Every day | ORAL | Status: DC
  Administered 2018-06-30 – 2018-07-03 (×4): 20 mg via ORAL
  Filled 2018-06-29 (×4): qty 1

## 2018-06-29 MED ORDER — HYDRALAZINE HCL 20 MG/ML IJ SOLN: 10.0000 mg | Freq: Four times a day (QID) | INTRAMUSCULAR | Status: DC | PRN

## 2018-06-29 MED ORDER — PROPOFOL 10 MG/ML IV BOLUS
INTRAVENOUS | Status: DC | PRN
  Administered 2018-06-29: 50 mg via INTRAVENOUS
  Administered 2018-06-29: 100 mg via INTRAVENOUS

## 2018-06-29 MED ORDER — MORPHINE SULFATE (PF) 2 MG/ML IV SOLN: 0.5000 mg | INTRAVENOUS | Status: DC | PRN

## 2018-06-29 MED ORDER — APIXABAN 2.5 MG PO TABS
2.5000 mg | ORAL_TABLET | Freq: Two times a day (BID) | ORAL | Status: DC
  Administered 2018-06-30 – 2018-07-03 (×7): 2.5 mg via ORAL
  Filled 2018-06-29 (×7): qty 1

## 2018-06-29 MED ORDER — DIPHENHYDRAMINE HCL 50 MG/ML IJ SOLN: 25.0000 mg | Freq: Four times a day (QID) | INTRAMUSCULAR | Status: DC | PRN

## 2018-06-29 MED ORDER — ALBUTEROL SULFATE (2.5 MG/3ML) 0.083% IN NEBU
2.5000 mg | INHALATION_SOLUTION | RESPIRATORY_TRACT | Status: DC | PRN
  Filled 2018-06-29: qty 3

## 2018-06-29 MED ORDER — AMITRIPTYLINE HCL 50 MG PO TABS
75.0000 mg | ORAL_TABLET | Freq: Every day | ORAL | Status: DC
  Administered 2018-06-29 – 2018-07-02 (×4): 75 mg via ORAL
  Filled 2018-06-29 (×4): qty 1

## 2018-06-29 MED ORDER — CLINDAMYCIN PHOSPHATE 900 MG/50ML IV SOLN
900.0000 mg | INTRAVENOUS | Status: AC
  Administered 2018-06-29: 900 mg via INTRAVENOUS
  Filled 2018-06-29 (×2): qty 50

## 2018-06-29 MED ORDER — HYDROMORPHONE HCL 1 MG/ML IJ SOLN
0.2500 mg | INTRAMUSCULAR | Status: DC | PRN
  Administered 2018-06-29: 0.25 mg via INTRAVENOUS

## 2018-06-29 MED ORDER — ONDANSETRON HCL 4 MG/2ML IJ SOLN
INTRAMUSCULAR | Status: AC
  Filled 2018-06-29: qty 2

## 2018-06-29 MED ORDER — SUGAMMADEX SODIUM 200 MG/2ML IV SOLN
INTRAVENOUS | Status: DC | PRN
  Administered 2018-06-29: 300 mg via INTRAVENOUS

## 2018-06-29 MED ORDER — ALBUTEROL SULFATE HFA 108 (90 BASE) MCG/ACT IN AERS
INHALATION_SPRAY | RESPIRATORY_TRACT | Status: DC | PRN
  Administered 2018-06-29 (×2): 4 via RESPIRATORY_TRACT

## 2018-06-29 SURGICAL SUPPLY — 37 items
BIT DRILL CALIBRATED 4.3MMX365 (DRILL) IMPLANT
BIT DRILL CROWE PNT TWST 4.5MM (DRILL) ×1 IMPLANT
BLADE SURG 15 STRL LF DISP TIS (BLADE) ×1 IMPLANT
BLADE SURG 15 STRL SS (BLADE) ×3
BNDG COHESIVE 4X5 TAN STRL (GAUZE/BANDAGES/DRESSINGS) ×3 IMPLANT
COVER BACK TABLE 60X90IN (DRAPES) ×3 IMPLANT
COVER SURGICAL LIGHT HANDLE (MISCELLANEOUS) ×3 IMPLANT
DRAPE STERI IOBAN 125X83 (DRAPES) ×3 IMPLANT
DRAPE U-SHAPE 76X120 STRL (DRAPES) ×3 IMPLANT
DRILL CALIBRATED 4.3MMX365 (DRILL) ×3
DRILL CROWE POINT TWIST 4.5MM (DRILL) ×3
DRSG MEPILEX BORDER 4X4 (GAUZE/BANDAGES/DRESSINGS) ×6 IMPLANT
DRSG MEPILEX BORDER 4X8 (GAUZE/BANDAGES/DRESSINGS) ×3 IMPLANT
ELECT REM PT RETURN 9FT ADLT (ELECTROSURGICAL) ×3
ELECTRODE REM PT RTRN 9FT ADLT (ELECTROSURGICAL) ×1 IMPLANT
GLOVE BIO SURGEON STRL SZ7.5 (GLOVE) ×3 IMPLANT
GLOVE BIOGEL PI IND STRL 8 (GLOVE) ×1 IMPLANT
GLOVE BIOGEL PI INDICATOR 8 (GLOVE) ×2
GOWN STRL REUS W/ TWL LRG LVL3 (GOWN DISPOSABLE) ×3 IMPLANT
GOWN STRL REUS W/TWL LRG LVL3 (GOWN DISPOSABLE) ×9
GUIDEPIN 3.2X17.5 THRD DISP (PIN) ×3 IMPLANT
GUIDEWIRE BEAD TIP (WIRE) ×3 IMPLANT
KIT BASIN OR (CUSTOM PROCEDURE TRAY) ×3 IMPLANT
KIT TURNOVER KIT B (KITS) ×3 IMPLANT
NAIL FEM RETRO 9X300 (Nail) ×3 IMPLANT
NS IRRIG 1000ML POUR BTL (IV SOLUTION) ×3 IMPLANT
PACK GENERAL/GYN (CUSTOM PROCEDURE TRAY) ×3 IMPLANT
PAD ARMBOARD 7.5X6 YLW CONV (MISCELLANEOUS) ×6 IMPLANT
SCREW CORT TI DBL LEAD 5X50 (Screw) ×3 IMPLANT
SCREW CORT TI DBL LEAD 5X60 (Screw) ×2 IMPLANT
SCREW CORT TI DBL LEAD 5X70 (Screw) ×3 IMPLANT
SCREW CORT TI DBLE LEAD 5X28 (Screw) ×6 IMPLANT
SCREW CORT TI DBLE LEAD 5X54 (Screw) ×3 IMPLANT
STAPLER VISISTAT 35W (STAPLE) ×3 IMPLANT
SUT VIC AB 2-0 CT1 27 (SUTURE) ×6
SUT VIC AB 2-0 CT1 TAPERPNT 27 (SUTURE) ×2 IMPLANT
WATER STERILE IRR 1000ML POUR (IV SOLUTION) ×3 IMPLANT

## 2018-06-29 NOTE — Anesthesia Postprocedure Evaluation (Signed)
Anesthesia Post Note  Patient: Sandra Ferrell  Procedure(s) Performed: OPEN REDUCTION INTERNAL FIXATION (ORIF) DISTAL FEMUR FRACTURE (Left )     Patient location during evaluation: PACU Anesthesia Type: General Level of consciousness: awake and alert Pain management: pain level controlled Vital Signs Assessment: post-procedure vital signs reviewed and stable Respiratory status: spontaneous breathing, nonlabored ventilation, respiratory function stable and patient connected to nasal cannula oxygen Cardiovascular status: blood pressure returned to baseline and stable Postop Assessment: no apparent nausea or vomiting Anesthetic complications: no    Last Vitals:  Vitals:   06/29/18 1325 06/29/18 1340  BP: (!) 141/63 (!) 135/58  Pulse: 99 (!) 101  Resp: 18 18  Temp:    SpO2: 95% 96%    Last Pain:  Vitals:   06/29/18 1340  TempSrc:   PainSc: 0-No pain                 Marlisha Vanwyk DAVID

## 2018-06-29 NOTE — H&P (Addendum)
TRH H&P   Patient Demographics:    Sandra Ferrell, is a 57 y.o. female  MRN: [id number]   DOB - 1961-02-14  Admit Date - 06/28/2018  Outpatient Primary MD for the patient is Jinny Sanders, MD  Referring MD/NP/PA:  Jeannett Senior  Outpatient Specialists:   Patient coming from: home  Chief Complaint  Patient presents with  . Fall  . Knee Pain      HPI:    Sandra Ferrell  is a 57 y.o. female, w h/o anxiety, fibromyalgia, hypertension, dm2, asthma/ Copd who presents with mechanical fall at her house where she slipped on water at about 6pm in the kitchen on linoleum floor .  Pt noted severe pain of her left knee.  Pt unable to walk and therefore called EMS.   In Ed,  Na 140, K 4.2, Bun 13, Creatinine 0.92 Wbc 16.1, hgb 11.5, Plt 348  CXR IMPRESSION: No active disease.  L hip xray IMPRESSION: No acute fracture of the pelvis or left hip.  Left knee xray IMPRESSION: Significantly displaced and comminuted distal femoral shaft fracture, 4.5 cm osseous overriding. No evidence of intra-articular extension.  Pt will be admitted for left displaced and comminuted distal femoral shaft fracture and mild anemia. .       Review of systems:    In addition to the HPI above,  No Fever-chills, No Headache, No changes with Vision or hearing, No problems swallowing food or Liquids, No Chest pain, Cough or Shortness of Breath, No Abdominal pain, No Nausea or Vommitting, Bowel movements are regular, No Blood in stool or Urine, No dysuria, No new skin rashes or bruises,    No new weakness, tingling, numbness in any extremity, No recent weight gain or loss, No polyuria, polydypsia or polyphagia, No significant Mental Stressors.  A full 10 point Review of Systems was done, except as stated above, all other Review of Systems were negative.   With Past History of the  following :    Past Medical History:  Diagnosis Date  . Allergic rhinitis, cause unspecified   . Anxiety state, unspecified   . Backache, unspecified   . Calculus of kidney   . Carpal tunnel syndrome, right   . Cough   . Depressive disorder, not elsewhere classified    managed with medications  . Esophageal reflux   . Extrinsic asthma with exacerbation   . Fibromyalgia   . Heart murmur   . Hypertension   . Osteoarthrosis, unspecified whether generalized or localized, unspecified site   . Other and unspecified hyperlipidemia   . Other screening mammogram   . Personal history of unspecified urinary disorder   . Pneumonia   . Routine general medical examination at a health care facility   . Routine gynecological examination   . Tobacco use disorder   . Type II or unspecified type diabetes mellitus without mention of  complication, not stated as uncontrolled   . Unspecified sleep apnea   . Wears glasses       Past Surgical History:  Procedure Laterality Date  . ANTERIOR CERVICAL DECOMP/DISCECTOMY FUSION N/A 01/19/2015   Procedure: ANTERIOR CERVICAL DECOMPRESSION/DISCECTOMY FUSION 2 LEVELS;  Surgeon: Phylliss Bob, MD;  Location: [place];  Service: Orthopedics;  Laterality: N/A;  Anterior cervical decompression fusion, cervical 5-6, cervical 6-7 with instrumentation and allograft  . ANTERIOR CERVICAL DECOMP/DISCECTOMY FUSION N/A 05/23/2017   Procedure: ANTERIOR CERVICAL DECOMPRESSION FUSION, CERVICAL 4-5 WITH INSTRUMENTATION AND ALLOGRAFT;  Surgeon: Phylliss Bob, MD;  Location: [place];  Service: Orthopedics;  Laterality: N/A;  ANTERIOR CERVICAL DECOMPRESSION FUSION, CERVICAL 4-5 WITH INSTRUMENTATION AND ALLOGRAFT; REQUEST 2 HOURS AND FLIP ROOM  . APPENDECTOMY  1973  . BACK SURGERY    . CHOLECYSTECTOMY  08/2006  . COLONOSCOPY    . TRANSTHORACIC ECHOCARDIOGRAM  02/2011   mild LVH, nl EF, mild diastolic dysfunction, no wall motion abnl      Social History:     Social History    Tobacco Use  . Smoking status: Current Every Day Smoker    Packs/day: 1.00    Years: 35.00    Pack years: 35.00    Types: Cigarettes  . Smokeless tobacco: Never Used  Substance Use Topics  . Alcohol use: No     Lives - at home  Mobility - typically walks by self   Family History :     Family History  Problem Relation Age of Onset  . Stroke Father   . Colon cancer Cousin        Home Medications:   Prior to Admission medications   Medication Sig Start Date End Date Taking? Authorizing Provider  ALPRAZolam (XANAX) 0.5 MG tablet TAKE 1 TABLET BY MOUTH THREE TIMES DAILY Patient taking differently: Take 0.5 mg by mouth 3 (three) times daily.  06/20/18  Yes Bedsole, Amy E, MD  amitriptyline (ELAVIL) 75 MG tablet TAKE 1 TABLET(75 MG) BY MOUTH AT BEDTIME Patient taking differently: Take 75 mg by mouth at bedtime.  05/19/18  Yes Copland, Frederico Hamman, MD  budesonide-formoterol (SYMBICORT) 160-4.5 MCG/ACT inhaler Inhale 2 puffs into the lungs 2 (two) times daily. 06/20/18  Yes Bedsole, Amy E, MD  dicyclomine (BENTYL) 20 MG tablet Take 1 tablet (20 mg total) 2 (two) times daily by mouth. 09/24/17  Yes Kirichenko, Tatyana, PA-C  furosemide (LASIX) 20 MG tablet Take 1 tablet (20 mg total) by mouth daily. 05/14/18  Yes Bedsole, Amy E, MD  gabapentin (NEURONTIN) 300 MG capsule TAKE 1 CAPSULE BY MOUTH IN THE MORNING, 1 CAPSULE IN THE AFTERNOON AND 2 CAPSULES AT BEDTIME Patient taking differently: Take 300-600 mg by mouth See admin instructions. Take 1 capsule every morning and afternoon then take 2 capsules at bedtime 05/23/18  Yes Bedsole, Amy E, MD  gemfibrozil (LOPID) 600 MG tablet TAKE 1 TABLET BY MOUTH TWICE DAILY Patient taking differently: Take 600 mg by mouth 2 (two) times daily before a meal.  06/05/18  Yes Bedsole, Amy E, MD  Insulin Glargine (LANTUS SOLOSTAR) 100 UNIT/ML Solostar Pen ADMINISTER 35 UNITS UNDER THE SKIN AT BEDTIME Patient taking differently: Inject 35 Units into the skin at  bedtime.  06/20/18  Yes Bedsole, Amy E, MD  metFORMIN (GLUCOPHAGE) 1000 MG tablet TAKE 1 TABLET BY MOUTH TWICE DAILY WITH MEALS Patient taking differently: Take 1,000 mg by mouth 2 (two) times daily with a meal.  06/13/18  Yes Bedsole, Amy E, MD  methocarbamol (ROBAXIN)  500 MG tablet TAKE 2 TABLETS BY MOUTH AT BEDTIME Patient taking differently: Take 1,000 mg by mouth at bedtime.  06/17/18  Yes Bedsole, Amy E, MD  pioglitazone (ACTOS) 45 MG tablet TAKE 1 TABLET BY MOUTH EVERY DAY Patient taking differently: Take 45 mg by mouth daily.  06/12/18  Yes Bedsole, Amy E, MD  PROAIR HFA 108 (90 Base) MCG/ACT inhaler TAKE 2 PUFFS BY MOUTH EVERY 4 HOURS AS NEEDED FOR WHEEZE OR FOR SHORTNESS OF BREATH Patient taking differently: Inhale 2 puffs into the lungs every 4 (four) hours as needed for wheezing.  03/29/18  Yes Bedsole, Amy E, MD  ranitidine (ZANTAC) 150 MG tablet Take 150 mg by mouth 2 (two) times daily.   Yes [provider]  ACCU-CHEK AVIVA PLUS test strip TEST BLOOD SUGAR TWICE DAILY AND DIRECTED 11/26/17   Jinny Sanders, MD  ACCU-CHEK SOFTCLIX LANCETS lancets CHECK BLOOD SUGAR TWICE DAILY AND AS DIRECTED 11/08/16   Jinny Sanders, MD  AIMSCO INSULIN SYR ULTRA THIN 31G X 5/16" 0.3 ML MISC  04/02/15   [provider]  Alcohol Swabs PADS Check blood sugar twice a day and as directed. Dx E11.9 11/03/15   Jinny Sanders, MD  B-D ULTRAFINE III SHORT PEN 31G X 8 MM MISC USE AS DIRECTED WITH INSULIN PEN Patient taking differently: USE DAILY WITH INSULIN PEN AS NEEDED FOR HIGH BLOOD GLUCOSE READINGS 09/18/16   Bedsole, Amy E, MD  Blood Glucose Monitoring Suppl (ACCU-CHEK AVIVA PLUS) w/Device KIT Check blood sugar twice daily and as directed. 02/07/17   Jinny Sanders, MD  Lancet Devices (ADJUSTABLE LANCING DEVICE) MISC Check blood sugar twice a day and as directed. Dx E11.9 11/03/15   Jinny Sanders, MD  nicotine (NICODERM CQ - DOSED IN MG/24 HOURS) 14 mg/24hr patch Place 1 patch (14 mg total) onto  the skin daily. Patient not taking: Reported on 06/28/2018 06/06/17   Jinny Sanders, MD     Allergies:     Allergies  Allergen Reactions  . Benazepril Anaphylaxis, Swelling and Other (See Comments)    Angioedema, throat swelling  . Diclofenac Sodium Other (See Comments)    GI bleed  . Fluticasone-Salmeterol Hives, Itching and Swelling    Tongue was swollen  . Nsaids Other (See Comments)    Rectal bleeding  . Penicillins Hives and Rash    Has patient had a PCN reaction causing immediate rash, facial/tongue/throat swelling, SOB or lightheadedness with hypotension: no Has patient had a PCN reaction causing severe rash involving mucus membranes or skin necrosis: no Has patient had a PCN reaction that required hospitalization no Has patient had a PCN reaction occurring within the last 10 years: no If all of the above answers are "NO", then may proceed with Cephalosporin use.   . Aspirin Other (See Comments)    Burns stomach  . Propoxyphene Other (See Comments)    Darvocet - Headache     Physical Exam:   Vitals  Blood pressure (!) 160/79, pulse 93, resp. rate 16, SpO2 98 %.   1. General lying in bed in NAD,   2. Normal affect and insight, Not Suicidal or Homicidal, Awake Alert, Oriented X 3.  3. No F.N deficits, ALL C.Nerves Intact, Strength 5/5 all 4 extremities, Sensation intact all 4 extremities, Plantars down going.  4. Ears and Eyes appear Normal, Conjunctivae clear, PERRLA. Moist Oral Mucosa.  5. Supple Neck, No JVD, No cervical lymphadenopathy appriciated, No Carotid Bruits.  6. Symmetrical Chest  wall movement, Good air movement bilaterally, CTAB.  7. RRR, No Gallops, Rubs or Murmurs, No Parasternal Heave.  8. Positive Bowel Sounds, Abdomen Soft, No tenderness, No organomegaly appriciated,No rebound -guarding or rigidity.  9.  No Cyanosis, Normal Skin Turgor, No Skin Rash or Bruise.  10. Good muscle tone,  joints appear normal , no effusions, Normal ROM.  11.  No Palpable Lymph Nodes in Neck or Axillae   unable to move left knee without pain   Data Review:    CBC Recent Labs  Lab 06/28/18 2202  WBC 16.1*  HGB 11.5*  HCT 36.6  PLT 348  MCV 96.6  MCH 30.3  MCHC 31.4  RDW 14.6  LYMPHSABS 1.6  MONOABS 1.1*  EOSABS 0.2  BASOSABS 0.0   ------------------------------------------------------------------------------------------------------------------  Chemistries  Recent Labs  Lab 06/28/18 2202  NA 140  K 4.2  CL 104  CO2 22  GLUCOSE 154*  BUN 13  CREATININE 0.92  CALCIUM 8.4*   ------------------------------------------------------------------------------------------------------------------ estimated creatinine clearance is 68.3 mL/min (by C-G formula based on SCr of 0.92 mg/dL). ------------------------------------------------------------------------------------------------------------------ No results for input(s): TSH, T4TOTAL, T3FREE, THYROIDAB in the last 72 hours.  Invalid input(s): FREET3  Coagulation profile No results for input(s): INR, PROTIME in the last 168 hours. ------------------------------------------------------------------------------------------------------------------- No results for input(s): DDIMER in the last 72 hours. -------------------------------------------------------------------------------------------------------------------  Cardiac Enzymes No results for input(s): CKMB, TROPONINI, MYOGLOBIN in the last 168 hours.  Invalid input(s): CK ------------------------------------------------------------------------------------------------------------------ No results found for: BNP   ---------------------------------------------------------------------------------------------------------------  Urinalysis    Component Value Date/Time   COLORURINE YELLOW 09/24/2017 [place] 09/24/2017 0452   LABSPEC 1.012 09/24/2017 0452   PHURINE 6.0 09/24/2017 [place]  09/24/2017 0452   GLUCOSEU NEGATIVE 12/26/2010 1044   HGBUR SMALL (A) 09/24/2017 0452   HGBUR large 04/10/2010 1507   BILIRUBINUR NEGATIVE 09/24/2017 0452   BILIRUBINUR neg 12/12/2016 0804   KETONESUR 20 (A) 09/24/2017 0452   PROTEINUR NEGATIVE 09/24/2017 0452   UROBILINOGEN negative 12/12/2016 0804   UROBILINOGEN 0.2 01/12/2015 1037   NITRITE NEGATIVE 09/24/2017 0452   LEUKOCYTESUR NEGATIVE 09/24/2017 0452    ----------------------------------------------------------------------------------------------------------------   Imaging Results:    Dg Chest 1 View  Result Date: 06/28/2018 CLINICAL DATA:  Preop chest x-ray.  Femur fracture. EXAM: CHEST  1 VIEW COMPARISON:  Chest x-ray dated 05/20/2017. FINDINGS: The heart size is upper normal. Both lungs are clear. The visualized skeletal structures are unremarkable. IMPRESSION: No active disease. Electronically Signed   By: Franki Cabot M.D.   On: 06/28/2018 22:09   Dg Knee Complete 4 Views Left  Result Date: 06/28/2018 CLINICAL DATA:  Slip on water at home leading to fall. Left hip and knee pain. EXAM: LEFT KNEE - COMPLETE 4+ VIEW COMPARISON:  None. FINDINGS: Comminuted and significantly displaced distal femur fracture with 17 mm posterior displacement of distal fracture fragment and 4.5 cm osseous overriding. No evidence of intra-articular extension or joint effusion. Degenerative change of the knee joint. There is patellar [place]. IMPRESSION: Significantly displaced and comminuted distal femoral shaft fracture, 4.5 cm osseous overriding. No evidence of intra-articular extension. Electronically Signed   By: Jeb Levering M.D.   On: 06/28/2018 22:15   Dg Hip Unilat W Or Wo Pelvis 2-3 Views Left  Result Date: 06/28/2018 CLINICAL DATA:  Slip on water at home leading to fall. Left hip and knee pain. EXAM: DG HIP (WITH OR WITHOUT PELVIS) 2-3V LEFT COMPARISON:  Radiograph 04/06/2012 FINDINGS: The cortical margins of the bony  pelvis and left hip  are intact. No fracture. Pubic symphysis and sacroiliac joints are congruent. Mild left acetabular spurring. Both femoral heads are well-seated in the respective acetabula. Left distal femur fracture partially included in the field of view. IMPRESSION: No acute fracture of the pelvis or left hip. Electronically Signed   By: Jeb Levering M.D.   On: 06/28/2018 22:07   ekg pending   Assessment & Plan:    Principal Problem:   Closed femur fracture ([place]) Active Problems:   Diabetes mellitus with no complication (HCC)   Essential hypertension, benign   Anemia    Left displaced and comminuted distal femoral shaft fracture  Check urinalysis Check EKG Check PT, PTT Type and screen NPO Pt medically cleared to have surgery Orthopedics consulted by ED, appreciate input Recommend bone density test as outpatient as well as vitamin D level , TSH.   Dm2 Hold Lantus Hold Metformin Hold Actos fsbs q4h, ISS  Hyperlipidemia Cont Gemfibrozil 619m po bid  Anxiety Cont xanax 0.517mpo tid Cont Elavil 7580mo qhs  ? Hypertension Cont Lasix 37m14m qday Hydralazine 10mg46mq6h prn sbp >160    DVT Prophylaxis defer to orthopedics- SCDs  AM Labs Ordered, also please review Full Orders  Family Communication: Admission, patients condition and plan of care including tests being ordered have been discussed with the patient  who indicate understanding and agree with the plan and Code Status.  Code Status  FULL CODE  Likely DC to  TBD  Condition GUARDED    Consults called: orthopedics by ED  Admission status: inpatient,  Pt has significant femur fracture that will require surgery and hospitalization. Pt has significant comorbidities including dm2, hypertension, hyperlipidemia, and asthma/ tobacco use and will require more than 2 nites of hospitalization for inpatient stay.   Time spent in minutes : 70   JamesJani Gravelon 06/29/2018 at 12:21 AM  Between 7am to 7pm - Pager -  [phone] 7pm go to [email or web address] - password [email or web address] - Office  [phone]

## 2018-06-29 NOTE — Progress Notes (Addendum)
0930 Pt to short stay, report was given to [facility]. Pt's spouse present.  1430 received pt back from PACU, very sleepy. Easy to awaken. Denies pain at this time. Left hip dressings dry and intact.

## 2018-06-29 NOTE — Plan of Care (Signed)
  Problem: Nutrition: Goal: Adequate nutrition will be maintained Outcome: Progressing   Problem: Coping: Goal: Level of anxiety will decrease Outcome: Progressing   Problem: Pain Management: Goal: Pain level will decrease Outcome: Progressing

## 2018-06-29 NOTE — Discharge Instructions (Signed)
Information on my medicine - ELIQUIS (apixaban)  Why was Eliquis prescribed for you? Eliquis was prescribed for you to reduce the risk of blood clots forming after orthopedic surgery.    What do You need to know about Eliquis? Take your Eliquis TWICE DAILY - one tablet in the morning and one tablet in the evening with or without food.  It would be best to take the dose about the same time each day.  If you have difficulty swallowing the tablet whole please discuss with your pharmacist how to take the medication safely.  Take Eliquis exactly as prescribed by your doctor and DO NOT stop taking Eliquis without talking to the doctor who prescribed the medication.  Stopping without other medication to take the place of Eliquis may increase your risk of developing a clot.  After discharge, you should have regular check-up appointments with your healthcare provider that is prescribing your Eliquis.  What do you do if you miss a dose? If a dose of ELIQUIS is not taken at the scheduled time, take it as soon as possible on the same day and twice-daily administration should be resumed.  The dose should not be doubled to make up for a missed dose.  Do not take more than one tablet of ELIQUIS at the same time.  Important Safety Information A possible side effect of Eliquis is bleeding. You should call your healthcare provider right away if you experience any of the following: ? Bleeding from an injury or your nose that does not stop. ? Unusual colored urine (red or dark brown) or unusual colored stools (red or black). ? Unusual bruising for unknown reasons. ? A serious fall or if you hit your head (even if there is no bleeding).  Some medicines may interact with Eliquis and might increase your risk of bleeding or clotting while on Eliquis. To help avoid this, consult your healthcare provider or pharmacist prior to using any new prescription or non-prescription medications, including herbals,  vitamins, non-steroidal anti-inflammatory drugs (NSAIDs) and supplements.  This website has more information on Eliquis (apixaban): http://www.eliquis.com/eliquis/home    Discharge Instructions -- Lower Extremity Fracture   Elevate your foot to reduce pain & swelling of the toes.  Ice over the operative site may be helpful to reduce pain & swelling.  DO NOT USE HEAT.  Leave the dressing in place until you return to our office.  You may shower, but keep the bandage clean & dry.  You may not drive a car if safe operating requires the use of the injured extremity.   If not, please call our office today or the next business day to make your return appointment for approx 2 weeks after surgery.     Please call [phone] during normal business hours or [phone] after hours for any problems. Including the following:  - excessive redness of the incisions - drainage for more than 4 days - fever of more than 101.5 F  *Please note that pain medications will not be refilled after hours or on weekends.

## 2018-06-29 NOTE — Progress Notes (Signed)
[facility]                                                                              Patient Demographics  Sandra Ferrell, is a 57 y.o. female, DOB - 1961-05-01, IRJ:[id number]  Admit date - 06/28/2018   Admitting Physician Jani Gravel, MD  Outpatient Primary MD for the patient is Jinny Sanders, MD  Outpatient specialists:   LOS - 0  days    Chief Complaint  Patient presents with  . Fall  . Knee Pain       Brief summary  Sandra Ferrell  is a 57 y.o. female, w h/o anxiety, fibromyalgia, hypertension, dm2, asthma/ Copd who presents with mechanical fall at her house where she slipped on water in the kitchen on linoleum floor ,  and presented with a severe pain of her left knee with inability to ambulate.    ED evaluation revealed displaced comminuted distal femoral shaft fracture with mild anemia, is admitted for further management.  Assessment & Plan    Principal Problem:   Closed femur fracture ([place]) Active Problems:   Diabetes mellitus with no complication (HCC)   Essential hypertension, benign   Anemia   Femur fracture, left ([place])   1. Displaced left comminuted distal femoral shaft fracture:   Pain control  supportive care  orthopedist consulted -  Surgery planned for today   2. Type 2 diabetes mellitus: Continue holding Lantus, metformin and Actos.   Sliding scale insulin   3. Hypertension:   P.r.n. Hydralazine as needed a   Code Status:  Full code DVT Prophylaxis:  Per ortho Family Communication: Discussed in detail with the patient, all imaging results, lab results explained to the patient  and husband at bedside   Disposition Plan: TBD  Time Spent in minutes 32  minutes  Procedures:   Consultants:   Rayvon Char. Grandville Silos, MD  ,Orthopaedist  Antimicrobials:      Medications  Scheduled Meds: . [MAR Hold] ALPRAZolam  0.5 mg Oral TID  . [MAR Hold] amitriptyline  75 mg Oral QHS  . [MAR Hold] furosemide  20 mg Oral Daily  .  [MAR Hold] gemfibrozil  600 mg Oral BID AC  . [MAR Hold] insulin aspart  0-9 Units Subcutaneous Q4H  . [MAR Hold] nicotine  14 mg Transdermal Daily  . povidone-iodine  2 application Topical Once   Continuous Infusions: . sodium chloride 75 mL/hr at 06/29/18 0148  . clindamycin (CLEOCIN) IV     PRN Meds:.[MAR Hold] acetaminophen **OR** [MAR Hold] acetaminophen, [MAR Hold] diphenhydrAMINE, [MAR Hold] fentaNYL (SUBLIMAZE) injection, [MAR Hold] hydrALAZINE, [MAR Hold]  HYDROmorphone (DILAUDID) injection, [MAR Hold] ondansetron (ZOFRAN) IV   Antibiotics   Anti-infectives (From admission, onward)   Start     Dose/Rate Route Frequency Ordered Stop   06/29/18 0500  clindamycin (CLEOCIN) IVPB 900 mg     900 mg 100 mL/hr over 30 Minutes Intravenous To Surgery 06/29/18 0055 06/30/18 0500        Subjective:   Sandra Ferrell was seen and examined today.   Patient denies dizziness, chest pain, shortness of breath, abdominal pain, N/V/D/C, new weakness, numbess,  tingling. No acute events overnight.    Objective:   Vitals:   06/28/18 2004 06/29/18 0036 06/29/18 0148  BP: (!) 160/79 (!) 151/76 (!) 150/72  Pulse: 93 (!) 107 (!) 101  Resp: 16  18  Temp:   99.1 F (37.3 C)  TempSrc:   Oral  SpO2: 98% 95% 95%  Weight:   103.9 kg  Height:   4\' 9"  (1.448 m)    Intake/Output Summary (Last 24 hours) at 06/29/2018 0947 Last data filed at 06/29/2018 4970 Gross per 24 hour  Intake 165 ml  Output -  Net 165 ml     Wt Readings from Last 3 Encounters:  06/29/18 103.9 kg  06/20/18 102.6 kg  05/19/18 103.6 kg     Exam  General: NAD  HEENT: NCAT,  PERRL,MMM  Neck: SUPPLE, (-) JVD  Cardiovascular: RRR, (-) GALLOP, (-) MURMUR  Respiratory: CTA  Gastrointestinal: SOFT, (-) DISTENSION, BS(+), (_) TENDERNESS  Ext: (-) CYANOSIS, (-) EDEMA,   affected knee with tenderness  Neuro: A, OX 3  Skin:(-) RASH  Psych:NORMAL AFFECT/MOOD   Data Reviewed:  I have personally reviewed  following labs and imaging studies  Micro Results Recent Results (from the past 240 hour(s))  Surgical pcr screen     Status: None   Collection Time: 06/29/18  2:25 AM  Result Value Ref Range Status   MRSA, PCR NEGATIVE NEGATIVE Final   Staphylococcus aureus NEGATIVE NEGATIVE Final    Comment: (NOTE) The Xpert SA Assay (FDA approved for NASAL specimens in patients 37 years of age and older), is one component of a comprehensive surveillance program. It is not intended to diagnose infection nor to guide or monitor treatment. Performed at [facility], [place] [address] [place], [place] [address]     Radiology Reports Dg Chest 1 View  Result Date: 06/28/2018 CLINICAL DATA:  Preop chest x-ray.  Femur fracture. EXAM: CHEST  1 VIEW COMPARISON:  Chest x-ray dated 05/20/2017. FINDINGS: The heart size is upper normal. Both lungs are clear. The visualized skeletal structures are unremarkable. IMPRESSION: No active disease. Electronically Signed   By: Franki Cabot M.D.   On: 06/28/2018 22:09   Dg Knee Complete 4 Views Left  Result Date: 06/28/2018 CLINICAL DATA:  Slip on water at home leading to fall. Left hip and knee pain. EXAM: LEFT KNEE - COMPLETE 4+ VIEW COMPARISON:  None. FINDINGS: Comminuted and significantly displaced distal femur fracture with 17 mm posterior displacement of distal fracture fragment and 4.5 cm osseous overriding. No evidence of intra-articular extension or joint effusion. Degenerative change of the knee joint. There is patellar [place]. IMPRESSION: Significantly displaced and comminuted distal femoral shaft fracture, 4.5 cm osseous overriding. No evidence of intra-articular extension. Electronically Signed   By: Jeb Levering M.D.   On: 06/28/2018 22:15   Dg Hip Unilat W Or Wo Pelvis 2-3 Views Left  Result Date: 06/28/2018 CLINICAL DATA:  Slip on water at home leading to fall. Left hip and knee pain. EXAM: DG HIP (WITH OR WITHOUT PELVIS) 2-3V LEFT COMPARISON:   Radiograph 04/06/2012 FINDINGS: The cortical margins of the bony pelvis and left hip are intact. No fracture. Pubic symphysis and sacroiliac joints are congruent. Mild left acetabular spurring. Both femoral heads are well-seated in the respective acetabula. Left distal femur fracture partially included in the field of view. IMPRESSION: No acute fracture of the pelvis or left hip. Electronically Signed   By: Jeb Levering M.D.   On: 06/28/2018 22:07  Lab Data:  CBC: Recent Labs  Lab 06/28/18 2202 06/29/18 0512  WBC 16.1* 13.1*  NEUTROABS 13.1*  --   HGB 11.5* 11.4*  HCT 36.6 36.3  MCV 96.6 95.3  PLT 348 086   Basic Metabolic Panel: Recent Labs  Lab 06/28/18 2202 06/29/18 0512  NA 140 140  K 4.2 3.7  CL 104 107  CO2 22 24  GLUCOSE 154* 134*  BUN 13 10  CREATININE 0.92 0.71  CALCIUM 8.4* 8.2*   GFR: Estimated Creatinine Clearance: 78.3 mL/min (by C-G formula based on SCr of 0.71 mg/dL). Liver Function Tests: Recent Labs  Lab 06/29/18 0512  AST 22  ALT 15  ALKPHOS 70  BILITOT 0.4  PROT 6.2*  ALBUMIN 3.0*   No results for input(s): LIPASE, AMYLASE in the last 168 hours. No results for input(s): AMMONIA in the last 168 hours. Coagulation Profile: Recent Labs  Lab 06/29/18 0512  INR 0.98   Cardiac Enzymes: No results for input(s): CKTOTAL, CKMB, CKMBINDEX, TROPONINI in the last 168 hours. BNP (last 3 results) No results for input(s): PROBNP in the last 8760 hours. HbA1C: No results for input(s): HGBA1C in the last 72 hours. CBG: Recent Labs  Lab 06/29/18 0105 06/29/18 0435 06/29/18 0741  GLUCAP 173* 128* 140*   Lipid Profile: No results for input(s): CHOL, HDL, LDLCALC, TRIG, CHOLHDL, LDLDIRECT in the last 72 hours. Thyroid Function Tests: No results for input(s): TSH, T4TOTAL, FREET4, T3FREE, THYROIDAB in the last 72 hours. Anemia Panel: No results for input(s): VITAMINB12, FOLATE, FERRITIN, TIBC, IRON, RETICCTPCT in the last 72 hours. Urine  analysis:    Component Value Date/Time   COLORURINE YELLOW 09/24/2017 [place] 09/24/2017 0452   LABSPEC 1.012 09/24/2017 0452   PHURINE 6.0 09/24/2017 0452   GLUCOSEU NEGATIVE 09/24/2017 0452   GLUCOSEU NEGATIVE 12/26/2010 1044   HGBUR SMALL (A) 09/24/2017 0452   HGBUR large 04/10/2010 1507   BILIRUBINUR NEGATIVE 09/24/2017 0452   BILIRUBINUR neg 12/12/2016 0804   KETONESUR 20 (A) 09/24/2017 0452   PROTEINUR NEGATIVE 09/24/2017 0452   UROBILINOGEN negative 12/12/2016 0804   UROBILINOGEN 0.2 01/12/2015 1037   NITRITE NEGATIVE 09/24/2017 0452   LEUKOCYTESUR NEGATIVE 09/24/2017 0452     Benito Mccreedy M.D. [facility] 06/29/2018, 9:47 AM  Pager: [phone] Between 7am to 7pm - call Pager - [phone]  After 7pm go to [email or web address] - password [email or web address]  Call night coverage person covering after 7pm

## 2018-06-29 NOTE — Anesthesia Preprocedure Evaluation (Signed)
Anesthesia Evaluation  Patient identified by MRN, date of birth, ID band Patient awake    Reviewed: Allergy & Precautions, NPO status , Patient's Chart, lab work & pertinent test results  Airway Mallampati: II  TM Distance: >3 FB Neck ROM: Full    Dental   Pulmonary sleep apnea , COPD, Current Smoker,    Pulmonary exam normal        Cardiovascular hypertension, Normal cardiovascular exam     Neuro/Psych Anxiety Depression    GI/Hepatic   Endo/Other  diabetes, Type 2, Oral Hypoglycemic Agents  Renal/GU      Musculoskeletal   Abdominal   Peds  Hematology   Anesthesia Other Findings   Reproductive/Obstetrics                             Anesthesia Physical Anesthesia Plan  ASA: III  Anesthesia Plan: General   Post-op Pain Management:    Induction: Intravenous  PONV Risk Score and Plan: 2 and Ondansetron and Treatment may vary due to age or medical condition  Airway Management Planned: Oral ETT  Additional Equipment:   Intra-op Plan:   Post-operative Plan: Extubation in OR  Informed Consent: I have reviewed the patients History and Physical, chart, labs and discussed the procedure including the risks, benefits and alternatives for the proposed anesthesia with the patient or authorized representative who has indicated his/her understanding and acceptance.     Plan Discussed with: CRNA and Surgeon  Anesthesia Plan Comments:         Anesthesia Quick Evaluation

## 2018-06-29 NOTE — ED Provider Notes (Signed)
Sandra Ferrell   CSN: [id number] Arrival date & time: 06/28/18  1956     History   Chief Complaint Chief Complaint  Patient presents with  . Fall  . Knee Pain    HPI Sandra Ferrell is a 57 y.o. female.  HPI Sandra Ferrell is a 57 y.o. female with history of anxiety, fibromyalgia, diabetes, hypertension, asthma, COPD, presents to emergency department after fall.  Patient states she tripped on some water at her house and fell.  She states when she fell her knee twisted.  She felt a pop.  She landed onto the left hip.  Denies hitting her head or loss of consciousness.  She states that she is having severe pain in her left knee and unable to move it.  Denies any numbness or tingling to her foot or toes.  No treatment prior to coming in.  Did not receive any medications by EMS.  Past Medical History:  Diagnosis Date  . Allergic rhinitis, cause unspecified   . Anxiety state, unspecified   . Backache, unspecified   . Calculus of kidney   . Carpal tunnel syndrome, right   . Cough   . Depressive disorder, not elsewhere classified    managed with medications  . Esophageal reflux   . Extrinsic asthma with exacerbation   . Fibromyalgia   . Heart murmur   . Hypertension   . Osteoarthrosis, unspecified whether generalized or localized, unspecified site   . Other and unspecified hyperlipidemia   . Other screening mammogram   . Personal history of unspecified urinary disorder   . Pneumonia   . Routine general medical examination at a health care facility   . Routine gynecological examination   . Tobacco use disorder   . Type II or unspecified type diabetes mellitus without mention of complication, not stated as uncontrolled   . Unspecified sleep apnea   . Wears glasses     Patient Active Problem List   Diagnosis Date Noted  . Leg cramping 09/04/2017  . Bilateral carpal tunnel syndrome 04/16/2017  . Bilateral leg weakness  03/07/2017  . Muscular deconditioning 03/07/2017  . Frequent falls 03/05/2017  . History of fusion of cervical spine 03/05/2017  . History of lumbar fusion 03/05/2017  . COPD exacerbation ([place]) 10/01/2016  . COPD, moderate ([place]) 03/22/2016  . Counseling regarding end of life decision making 09/13/2015  . Microalbuminuria 05/19/2015  . Unspecified constipation 07/10/2013  . Essential hypertension, benign 02/21/2011  . UTI'S, HX OF 12/26/2010  . Chronic low back pain 02/22/2009  . Diabetes mellitus with no complication ([place]) 38/88/7579  . ALLERGIC RHINITIS 06/24/2007  . RENAL CALCULUS 06/24/2007  . OSTEOARTHRITIS 06/24/2007  . GAD (generalized anxiety disorder) 04/03/2007  . Hyperlipidemia LDL goal <100 03/12/2007  . TOBACCO ABUSE 03/12/2007  . Major depression, recurrent ([place]) 03/12/2007  . GERD 03/12/2007  . Fibromyalgia 03/12/2007  . SLEEP APNEA 03/12/2007    Past Surgical History:  Procedure Laterality Date  . ANTERIOR CERVICAL DECOMP/DISCECTOMY FUSION N/A 01/19/2015   Procedure: ANTERIOR CERVICAL DECOMPRESSION/DISCECTOMY FUSION 2 LEVELS;  Surgeon: Phylliss Bob, MD;  Location: [facility];  Service: Orthopedics;  Laterality: N/A;  Anterior cervical decompression fusion, cervical 5-6, cervical 6-7 with instrumentation and allograft  . ANTERIOR CERVICAL DECOMP/DISCECTOMY FUSION N/A 05/23/2017   Procedure: ANTERIOR CERVICAL DECOMPRESSION FUSION, CERVICAL 4-5 WITH INSTRUMENTATION AND ALLOGRAFT;  Surgeon: Phylliss Bob, MD;  Location: [facility];  Service: Orthopedics;  Laterality: N/A;  ANTERIOR CERVICAL DECOMPRESSION FUSION,  CERVICAL 4-5 WITH INSTRUMENTATION AND ALLOGRAFT; REQUEST 2 HOURS AND FLIP ROOM  . APPENDECTOMY  1973  . BACK SURGERY    . CHOLECYSTECTOMY  08/2006  . COLONOSCOPY    . TRANSTHORACIC ECHOCARDIOGRAM  02/2011   mild LVH, nl EF, mild diastolic dysfunction, no wall motion abnl     OB History   None      Home Medications    Prior to Admission medications   Medication  Sig Start Date End Date Taking? Authorizing Provider  ALPRAZolam (XANAX) 0.5 MG tablet TAKE 1 TABLET BY MOUTH THREE TIMES DAILY Patient taking differently: Take 0.5 mg by mouth 3 (three) times daily.  06/20/18  Yes Bedsole, Amy E, MD  amitriptyline (ELAVIL) 75 MG tablet TAKE 1 TABLET(75 MG) BY MOUTH AT BEDTIME Patient taking differently: Take 75 mg by mouth at bedtime.  05/19/18  Yes Copland, Frederico Hamman, MD  budesonide-formoterol (SYMBICORT) 160-4.5 MCG/ACT inhaler Inhale 2 puffs into the lungs 2 (two) times daily. 06/20/18  Yes Bedsole, Amy E, MD  dicyclomine (BENTYL) 20 MG tablet Take 1 tablet (20 mg total) 2 (two) times daily by mouth. 09/24/17  Yes Joceline Hinchcliff, PA-C  furosemide (LASIX) 20 MG tablet Take 1 tablet (20 mg total) by mouth daily. 05/14/18  Yes Bedsole, Amy E, MD  gabapentin (NEURONTIN) 300 MG capsule TAKE 1 CAPSULE BY MOUTH IN THE MORNING, 1 CAPSULE IN THE AFTERNOON AND 2 CAPSULES AT BEDTIME Patient taking differently: Take 300-600 mg by mouth See admin instructions. Take 1 capsule every morning and afternoon then take 2 capsules at bedtime 05/23/18  Yes Bedsole, Amy E, MD  gemfibrozil (LOPID) 600 MG tablet TAKE 1 TABLET BY MOUTH TWICE DAILY Patient taking differently: Take 600 mg by mouth 2 (two) times daily before a meal.  06/05/18  Yes Bedsole, Amy E, MD  Insulin Glargine (LANTUS SOLOSTAR) 100 UNIT/ML Solostar Pen ADMINISTER 35 UNITS UNDER THE SKIN AT BEDTIME Patient taking differently: Inject 35 Units into the skin at bedtime.  06/20/18  Yes Bedsole, Amy E, MD  metFORMIN (GLUCOPHAGE) 1000 MG tablet TAKE 1 TABLET BY MOUTH TWICE DAILY WITH MEALS Patient taking differently: Take 1,000 mg by mouth 2 (two) times daily with a meal.  06/13/18  Yes Bedsole, Amy E, MD  methocarbamol (ROBAXIN) 500 MG tablet TAKE 2 TABLETS BY MOUTH AT BEDTIME Patient taking differently: Take 1,000 mg by mouth at bedtime.  06/17/18  Yes Bedsole, Amy E, MD  pioglitazone (ACTOS) 45 MG tablet TAKE 1 TABLET BY MOUTH  EVERY DAY Patient taking differently: Take 45 mg by mouth daily.  06/12/18  Yes Bedsole, Amy E, MD  PROAIR HFA 108 (90 Base) MCG/ACT inhaler TAKE 2 PUFFS BY MOUTH EVERY 4 HOURS AS NEEDED FOR WHEEZE OR FOR SHORTNESS OF BREATH Patient taking differently: Inhale 2 puffs into the lungs every 4 (four) hours as needed for wheezing.  03/29/18  Yes Bedsole, Amy E, MD  ranitidine (ZANTAC) 150 MG tablet Take 150 mg by mouth 2 (two) times daily.   Yes [provider]  ACCU-CHEK AVIVA PLUS test strip TEST BLOOD SUGAR TWICE DAILY AND DIRECTED 11/26/17   Jinny Sanders, MD  ACCU-CHEK SOFTCLIX LANCETS lancets CHECK BLOOD SUGAR TWICE DAILY AND AS DIRECTED 11/08/16   Jinny Sanders, MD  AIMSCO INSULIN SYR ULTRA THIN 31G X 5/16" 0.3 ML MISC  04/02/15   [provider]  Alcohol Swabs PADS Check blood sugar twice a day and as directed. Dx E11.9 11/03/15   Jinny Sanders, MD  B-D ULTRAFINE III SHORT PEN 31G X 8 MM MISC USE AS DIRECTED WITH INSULIN PEN Patient taking differently: USE DAILY WITH INSULIN PEN AS NEEDED FOR HIGH BLOOD GLUCOSE READINGS 09/18/16   Bedsole, Amy E, MD  Blood Glucose Monitoring Suppl (ACCU-CHEK AVIVA PLUS) w/Device KIT Check blood sugar twice daily and as directed. 02/07/17   Jinny Sanders, MD  Lancet Devices (ADJUSTABLE LANCING DEVICE) MISC Check blood sugar twice a day and as directed. Dx E11.9 11/03/15   Jinny Sanders, MD  nicotine (NICODERM CQ - DOSED IN MG/24 HOURS) 14 mg/24hr patch Place 1 patch (14 mg total) onto the skin daily. Patient not taking: Reported on 06/28/2018 06/06/17   Jinny Sanders, MD    Family History Family History  Problem Relation Age of Onset  . Stroke Father   . Colon cancer Cousin     Social History Social History   Tobacco Use  . Smoking status: Current Every Day Smoker    Packs/day: 1.00    Years: 35.00    Pack years: 35.00    Types: Cigarettes  . Smokeless tobacco: Never Used  Substance Use Topics  . Alcohol use: No  . Drug use: No      Allergies   Benazepril; Diclofenac sodium; Fluticasone-salmeterol; Nsaids; Penicillins; Aspirin; and Propoxyphene   Review of Systems Review of Systems  Constitutional: Negative for chills and fever.  Respiratory: Negative for cough, chest tightness and shortness of breath.   Cardiovascular: Negative for chest pain, palpitations and leg swelling.  Gastrointestinal: Negative for abdominal pain, diarrhea, nausea and vomiting.  Genitourinary: Negative for dysuria, flank pain and pelvic pain.  Musculoskeletal: Positive for arthralgias, joint swelling and myalgias. Negative for neck pain and neck stiffness.  Skin: Negative for rash.  Neurological: Negative for dizziness, weakness, numbness and headaches.  All other systems reviewed and are negative.    Physical Exam Updated Vital Signs BP (!) 160/79 (BP Location: Right Arm)   Pulse 93   Resp 16   SpO2 98%   Physical Exam  Constitutional: She appears well-developed and well-nourished. No distress.  HENT:  Head: Normocephalic.  Eyes: Conjunctivae are normal.  Neck: Neck supple.  Cardiovascular: Normal rate, regular rhythm and normal heart sounds.  Pulmonary/Chest: [place] normal and breath sounds normal. No respiratory distress. She has no wheezes. She has no rales.  Abdominal: Soft. Bowel sounds are normal. She exhibits no distension. There is no tenderness. There is no rebound.  Musculoskeletal: She exhibits no edema.  Swelling and deformity noted to the left knee.  Diffuse tenderness to palpation.  Some tenderness noted to the left lateral hip as well.  Normal ankle and foot.  Dorsalis pedis pulses intact.  Unable to move left leg due to pain.  Neurological: She is alert.  Skin: Skin is warm and dry.  Psychiatric: She has a normal mood and affect. Her behavior is normal.  Nursing Ferrell and vitals reviewed.    ED Treatments / Results  Labs (all labs ordered are listed, but only abnormal results are displayed) Labs  Reviewed  CBC WITH DIFFERENTIAL/PLATELET - Abnormal; Notable for the following components:      Result Value   WBC 16.1 (*)    RBC 3.79 (*)    Hemoglobin 11.5 (*)    Neutro Abs 13.1 (*)    Monocytes Absolute 1.1 (*)    All other components within normal limits  BASIC METABOLIC PANEL - Abnormal; Notable for the following components:   Glucose, Bld 154 (*)  Calcium 8.4 (*)    All other components within normal limits  TYPE AND SCREEN    EKG None  Radiology Dg Chest 1 View  Result Date: 06/28/2018 CLINICAL DATA:  Preop chest x-ray.  Femur fracture. EXAM: CHEST  1 VIEW COMPARISON:  Chest x-ray dated 05/20/2017. FINDINGS: The heart size is upper normal. Both lungs are clear. The visualized skeletal structures are unremarkable. IMPRESSION: No active disease. Electronically Signed   By: Franki Cabot M.D.   On: 06/28/2018 22:09   Dg Knee Complete 4 Views Left  Result Date: 06/28/2018 CLINICAL DATA:  Slip on water at home leading to fall. Left hip and knee pain. EXAM: LEFT KNEE - COMPLETE 4+ VIEW COMPARISON:  None. FINDINGS: Comminuted and significantly displaced distal femur fracture with 17 mm posterior displacement of distal fracture fragment and 4.5 cm osseous overriding. No evidence of intra-articular extension or joint effusion. Degenerative change of the knee joint. There is patellar [place]. IMPRESSION: Significantly displaced and comminuted distal femoral shaft fracture, 4.5 cm osseous overriding. No evidence of intra-articular extension. Electronically Signed   By: Jeb Levering M.D.   On: 06/28/2018 22:15   Dg Hip Unilat W Or Wo Pelvis 2-3 Views Left  Result Date: 06/28/2018 CLINICAL DATA:  Slip on water at home leading to fall. Left hip and knee pain. EXAM: DG HIP (WITH OR WITHOUT PELVIS) 2-3V LEFT COMPARISON:  Radiograph 04/06/2012 FINDINGS: The cortical margins of the bony pelvis and left hip are intact. No fracture. Pubic symphysis and sacroiliac joints are congruent. Mild left  acetabular spurring. Both femoral heads are well-seated in the respective acetabula. Left distal femur fracture partially included in the field of view. IMPRESSION: No acute fracture of the pelvis or left hip. Electronically Signed   By: Jeb Levering M.D.   On: 06/28/2018 22:07    Procedures Procedures (including critical care time)  Medications Ordered in ED Medications  fentaNYL (SUBLIMAZE) injection 50 mcg (50 mcg Intravenous Given 06/28/18 2125)     Initial Impression / Assessment and Plan / ED Course  I have reviewed the triage vital signs and the nursing notes.  Pertinent labs & imaging results that were available during my care of the patient were reviewed by me and considered in my medical decision making (see chart for details).     Patient with mechanical fall, having left knee pain.  X-rays of the knee and hip ordered.  Will give fentanyl for pain.   Displaced left distal femur fracture as described above.  I will page orthopedics and get labs.   12:00 am Spoke with Dr. Grandville Silos with orthopedics, he will come by and see patient tonight.  Asked for medicine to admit due to medical problems.   12:11 AM Spoke with hospitalist, will admit patient.  Patient's pain is well controlled at this time with fentanyl.  Vitals:   06/28/18 2004  BP: (!) 160/79  Pulse: 93  Resp: 16  SpO2: 98%     Final Clinical Impressions(s) / ED Diagnoses   Final diagnoses:  Other closed fracture of distal end of left femur, initial encounter [facility])    ED Discharge Orders    None       Jeannett Senior, PA-C 06/29/18 0012    Virgel Manifold, MD 07/03/18 1440

## 2018-06-29 NOTE — Transfer of Care (Signed)
Immediate Anesthesia Transfer of Care Note  Patient: LEEAN AMEZCUA  Procedure(s) Performed: OPEN REDUCTION INTERNAL FIXATION (ORIF) DISTAL FEMUR FRACTURE (Left )  Patient Location: PACU  Anesthesia Type:General  Level of Consciousness: awake, alert , oriented and patient cooperative  Airway & Oxygen Therapy: Patient Spontanous Breathing and Patient connected to face mask oxygen  Post-op Assessment: Report given to RN, Post -op Vital signs reviewed and stable and Patient moving all extremities  Post vital signs: Reviewed and stable  Last Vitals:  Vitals Value Taken Time  BP 145/74 06/29/2018 12:40 PM  Temp    Pulse 102 06/29/2018 12:44 PM  Resp 16 06/29/2018 12:44 PM  SpO2 90 % 06/29/2018 12:44 PM  Vitals shown include unvalidated device data.  Last Pain:  Vitals:   06/29/18 0637  TempSrc:   PainSc: Asleep         Complications: No apparent anesthesia complications

## 2018-06-29 NOTE — Anesthesia Procedure Notes (Signed)
Procedure Name: Intubation Date/Time: 06/29/2018 10:40 AM Performed by: Lillia Abed, MD Pre-anesthesia Checklist: Patient identified, Emergency Drugs available, Suction available and Patient being monitored Patient Re-evaluated:Patient Re-evaluated prior to induction Oxygen Delivery Method: Circle system utilized Preoxygenation: Pre-oxygenation with 100% oxygen Induction Type: IV induction Ventilation: Mask ventilation without difficulty Laryngoscope Size: Mac and 3 Grade View: Grade I Tube type: Oral Tube size: 7.5 mm Number of attempts: 1 Airway Equipment and Method: Stylet Placement Confirmation: ETT inserted through vocal cords under direct vision,  positive ETCO2 and breath sounds checked- equal and bilateral Secured at: 21 cm Tube secured with: Tape Dental Injury: Teeth and Oropharynx as per pre-operative assessment

## 2018-06-29 NOTE — Op Note (Addendum)
06/29/2018  12:45 PM  PATIENT:  Sandra Ferrell  57 y.o. female  PRE-OPERATIVE DIAGNOSIS: Displaced left distal femur fracture  POST-OPERATIVE DIAGNOSIS:  Same  PROCEDURE: ORIF left distal femur fracture with retrograde IM nail  SURGEON: Rayvon Char. Grandville Silos, MD  PHYSICIAN ASSISTANT: Morley Kos, OPA-C  ANESTHESIA:  general  SPECIMENS:  None  DRAINS:   None  EBL:  less than 100 mL  PREOPERATIVE INDICATIONS:  Sandra Ferrell is a  57 y.o. female with displaced comminuted closed left supracondylar femur fracture  The risks benefits and alternatives were discussed with the patient preoperatively including but not limited to the risks of infection, bleeding, nerve injury, cardiopulmonary complications, the need for revision surgery, among others, and the patient verbalized understanding and consented to proceed.  OPERATIVE IMPLANTS: Biomet [place] retrograde nail, length 30, size 9  OPERATIVE PROCEDURE:  After receiving prophylactic antibiotics, the patient was escorted to the operative theatre and placed in a supine position.  A bump was placed under the left lower back and pelvis.  General anesthesia was administered.  A surgical "time-out" was performed during which the planned procedure, proposed operative site, and the correct patient identity were compared to the operative consent and agreement confirmed by the circulating nurse according to current facility policy.    Before prepping and draping, the alignment of the fracture and the ability to manipulate it was assessed fluoroscopically.  The left lower extremity was then prepped with DuraPrep and draped in standard fashion.  An anterior midline incision was made sharply with a scalpel, subcutaneous tissues dissected with electrocautery and spreading dissection.  A medial parapatellar approach was made and extended gaining access to the knee joint.  Hematoma was evacuated.  Using fluoroscopic guidance, starting point was marked  and the guidepin advanced.  Next the reamer was placed using the soft tissue protector to allow for reaming to accept the entry point in the nail.  The reduction device was then inserted, and through manipulation of the fracture it was placed across the fracture into the proximal fragment and up the shaft enough to advance the ball-tipped guide rod.  It was also used to some respect to lever the fracture into better alignment.  Once the ball-tipped guide rod was placed, the length of the nail was selected to be at 30.  Reaming commenced, there was even some chatter at the 8.  We reamed to a 10-1/2 and placed on 9 nail.  The nail was placed, the distal interlocking screws placed, 2 transversely into obliquely and the fracture had reasonably good alignment in all planes at this point.  The set screw was tightened to lock all of the distal screws and then the 2 proximal screws were placed with radiographic guidance and a freehand method.  Final fluoroscopic images were obtained to ensure adequacy of the reduction and appropriate placement of the hardware.  Images were saved and printed.  The wounds were then copiously irrigated.  The limb was stable and could be lifted by the heel, with alignment and stability restored.  The rotational alignment appeared to be acceptable.  After all of the wounds were irrigated, the smaller stab wounds were closed with 2-0 Vicryl interrupted subcuticular sutures.  The medial parapatellar approach deep layer was closed with interrupted 2-0 Vicryl suture, the skin with interrupted subcuticular suture of the same kind, followed by staples of the skin.  Aquacel dressings were placed and she was awakened and taken to the recovery room in stable condition, breathing  spontaneously  DISPOSITION: She will return to the floor for continued postop care, initially touchdown weightbearing in the left lower extremity, initiating PT.  Depending upon her progress with PT, she will either require SNF  placement or possibly home with home PT. for VTE prophylaxis, we will continue the SCDs, and Eliquis will be initiated tomorrow for planned 2-week therapy.

## 2018-06-30 ENCOUNTER — Inpatient Hospital Stay (HOSPITAL_COMMUNITY): Payer: Medicare Other

## 2018-06-30 ENCOUNTER — Encounter (HOSPITAL_COMMUNITY): Payer: Self-pay | Admitting: Orthopedic Surgery

## 2018-06-30 LAB — COMPREHENSIVE METABOLIC PANEL
ALBUMIN: 2.6 g/dL — AB (ref 3.5–5.0)
ALT: 16 U/L (ref 0–44)
AST: 20 U/L (ref 15–41)
Alkaline Phosphatase: 57 U/L (ref 38–126)
Anion gap: 7 (ref 5–15)
BUN: 9 mg/dL (ref 6–20)
CHLORIDE: 106 mmol/L (ref 98–111)
CO2: 25 mmol/L (ref 22–32)
Calcium: 8.1 mg/dL — ABNORMAL LOW (ref 8.9–10.3)
Creatinine, Ser: 0.65 mg/dL (ref 0.44–1.00)
GFR calc Af Amer: >60 mL/min (ref >60)
GFR calc non Af Amer: >60 mL/min (ref >60)
GLUCOSE: 117 mg/dL — AB (ref 70–99)
POTASSIUM: 3.5 mmol/L (ref 3.5–5.1)
SODIUM: 138 mmol/L (ref 135–145)
Total Bilirubin: 0.5 mg/dL (ref 0.3–1.2)
Total Protein: 5.5 g/dL — ABNORMAL LOW (ref 6.5–8.1)

## 2018-06-30 LAB — CBC
HCT: 30.4 % — ABNORMAL LOW (ref 36.0–46.0)
Hemoglobin: 9.5 g/dL — ABNORMAL LOW (ref 12.0–15.0)
MCH: 30.1 pg (ref 26.0–34.0)
MCHC: 31.3 g/dL (ref 30.0–36.0)
MCV: 96.2 fL (ref 78.0–100.0)
PLATELETS: 286 10*3/uL (ref 150–400)
RBC: 3.16 MIL/uL — AB (ref 3.87–5.11)
RDW: 14.6 % (ref 11.5–15.5)
WBC: 9.8 10*3/uL (ref 4.0–10.5)

## 2018-06-30 LAB — GLUCOSE, CAPILLARY
GLUCOSE-CAPILLARY: 129 mg/dL — AB (ref 70–99)
Glucose-Capillary: 103 mg/dL — ABNORMAL HIGH (ref 70–99)
Glucose-Capillary: 129 mg/dL — ABNORMAL HIGH (ref 70–99)
Glucose-Capillary: 125 mg/dL — ABNORMAL HIGH (ref 70–99)

## 2018-06-30 MED ORDER — ALUM & MAG HYDROXIDE-SIMETH 200-200-20 MG/5ML PO SUSP
15.0000 mL | ORAL | Status: DC | PRN
  Administered 2018-06-30 – 2018-07-02 (×3): 15 mL via ORAL
  Filled 2018-06-30 (×3): qty 30

## 2018-06-30 MED ORDER — INSULIN GLARGINE 100 UNIT/ML ~~LOC~~ SOLN
35.0000 [IU] | Freq: Every day | SUBCUTANEOUS | Status: DC
  Administered 2018-06-30 – 2018-07-02 (×3): 35 [IU] via SUBCUTANEOUS
  Filled 2018-06-30 (×4): qty 0.35

## 2018-06-30 NOTE — Progress Notes (Signed)
  PATIENT ID: Sandra Ferrell  MRN: [id number]  DOB/AGE:  Mar 18, 1961 / 57 y.o.  1 Day Post-Op Procedure(s) (LRB): OPEN REDUCTION INTERNAL FIXATION (ORIF) DISTAL FEMUR FRACTURE (Left)  Subjective: Pain is moderate and reasonbly well-controlled.  No c/o chest pain or SOB.   Sitting in bed, eating, no PT yet   Objective: Vital signs in last 24 hours: Temp:  [97.5 F (36.4 C)-98.7 F (37.1 C)] 98.6 F (37 C) (08/26 0437) Pulse Rate:  [96-107] 107 (08/26 0437) Resp:  [15-18] 15 (08/25 1430) BP: (99-149)/(49-76) 99/49 (08/26 0437) SpO2:  [93 %-98 %] 97 % (08/26 0437)  Intake/Output from previous day: 08/25 0701 - 08/26 0700 In: 1150 [I.V.:1100; IV Piggyback:50] Out: 100 [Blood:100] Intake/Output this shift: No intake/output data recorded.  Recent Labs    06/28/18 2202 06/29/18 0512 06/30/18 0342  HGB 11.5* 11.4* 9.5*   Recent Labs    06/29/18 0512 06/30/18 0342  WBC 13.1* 9.8  RBC 3.81* 3.16*  HCT 36.3 30.4*  PLT 357 286   Recent Labs    06/29/18 0512 06/30/18 0342  NA 140 138  K 3.7 3.5  CL 107 106  CO2 24 25  BUN 10 9  CREATININE 0.71 0.65  GLUCOSE 134* 117*  CALCIUM 8.2* 8.1*   Recent Labs    06/29/18 0512  INR 0.98    Physical Exam: ABD soft Sensation intact distally Dorsiflexion/Plantar flexion intact main dressing spotted, but appears dried  Assessment/Plan: 1 Day Post-Op Procedure(s) (LRB): OPEN REDUCTION INTERNAL FIXATION (ORIF) DISTAL FEMUR FRACTURE (Left)   Up with therapy Touch Down Weight Bearing (TDWB) L LE VTE prophylaxis: Eliquis x 2 weeks, started today D/C-->home with HHPT vs. SNF, depending upon progress with therapy May d/c from hospital from Ortho POV once deemed medically fit and appropriate d/c plan in place  Dois Juarbe A. Grandville Silos, [place], [place]  [address] Office: [phone] Mobile: [phone]  06/30/2018, 9:03 AM

## 2018-06-30 NOTE — Progress Notes (Signed)
Physical Therapy Evaluation Patient Details Name: Sandra Ferrell MRN: [id number] DOB: 1961/03/06 Today's Date: 06/30/2018   History of Present Illness  Pt is a 57 y.o. female s/p ORIF on L. distal femur. Pt is touchdown weight bearing on LLE. PMH significant of T2DM, HTN, asthma/COPD.   Clinical Impression  Patient is s/p above surgery resulting in functional limitations due to the deficits listed below (see PT Problem List). At time of evaluation pt remains limited due to LLE pain, requiring Max+2 for bed mobility. Attempted to stand, but pt unable to clear hips from bed due to pain and was unsafe to transfer to [facility]. Pt reports she urinated on self while attempting to stand. After discussion with RN, decided pt would not be appropriate for transfer to chair in stedy and assisted pt sit>supine. Recommending staff utilize [facility] for transfers going forward. Cleaned pt and changed sheets, while RN placed PureWick. Recommending SNF at d/c - defer to next venue of care for AD needs. Patient will benefit from skilled PT to increase their independence and safety with mobility to allow discharge to the venue listed below.        SNF;Supervision/Assistance - 24 hour    Equipment Recommendations       Recommendations for Other Services       Precautions / Restrictions Precautions Precautions: Fall Restrictions Weight Bearing Restrictions: Yes LLE Weight Bearing: Touchdown weight bearing      Mobility  Bed Mobility Overal bed mobility: Needs Assistance Bed Mobility: Supine to Sit     Supine to sit: Max assist;+2 for physical assistance;HOB elevated     General bed mobility comments: VCs for hand placement and LE positioning for supine>sit. Pt required Mod A +2 for trunk elevation and LE lowering secondary to pain. Mod A + 1 with bed pad to scoot pt EOB while pt assisted with scoot by pulling on bed rail.    Transfers Overall transfer level: Needs assistance                General transfer comment: Attempted stand with Max A +2, but pt unable to clear hips from bed secondary to pain. Pt unability to hold bladder during transfer attempt.  Ambulation/Gait                Stairs            Wheelchair Mobility    Modified Rankin (Stroke Patients Only)       Balance Overall balance assessment: Needs assistance Sitting-balance support: Single extremity supported;Feet supported Sitting balance-Leahy Scale: Fair Sitting balance - Comments: Pt requried at least 1 UE support to maintain sitting balance.                                     Pertinent Vitals/Pain Pain Assessment: Faces Faces Pain Scale: Hurts whole lot Pain Location: LLE Pain Descriptors / Indicators: Operative site guarding;Grimacing;Sharp Pain Intervention(s): Limited activity within patient's tolerance;Monitored during session;Repositioned    Home Living Family/patient expects to be discharged to:: Skilled nursing facility Living Arrangements: Spouse/significant other                    Prior Function Level of Independence: Independent with assistive device(s)         Comments: Utilizing cane and/or rollator intermittently.      Hand Dominance        Extremity/Trunk Assessment   Upper Extremity Assessment  Upper Extremity Assessment: Generalized weakness    Lower Extremity Assessment Lower Extremity Assessment: LLE deficits/detail LLE Deficits / Details: s/p LLE surgery     Cervical / Trunk Assessment Cervical / Trunk Assessment: (unable to assess)  Communication   Communication: No difficulties  Cognition Arousal/Alertness: Awake/alert Behavior During Therapy: Anxious Overall Cognitive Status: Within Functional Limits for tasks assessed                                        General Comments General comments (skin integrity, edema, etc.): Pt spouse present during session and very anxious for pt.    Exercises      Assessment/Plan    PT Assessment Patient needs continued PT services  PT Problem List Decreased strength;Decreased range of motion;Decreased activity tolerance;Decreased balance;Decreased mobility;Decreased coordination;Decreased knowledge of use of DME;Decreased knowledge of precautions;Pain       PT Treatment Interventions DME instruction;Gait training;Functional mobility training;Therapeutic activities;Therapeutic exercise;Balance training;Patient/family education;Modalities    PT Goals (Current goals can be found in the Care Plan section)  Acute Rehab PT Goals Patient Stated Goal: Be able to get to the [facility] PT Goal Formulation: With patient/family Time For Goal Achievement: 07/14/18 Potential to Achieve Goals: Good    Frequency Min 3X/week   Barriers to discharge        Co-evaluation               AM-PAC PT "6 Clicks" Daily Activity  Outcome Measure Difficulty turning over in bed (including adjusting bedclothes, sheets and blankets)?: Unable Difficulty moving from lying on back to sitting on the side of the bed? : Unable Difficulty sitting down on and standing up from a chair with arms (e.g., wheelchair, bedside commode, etc,.)?: Unable Help needed moving to and from a bed to chair (including a wheelchair)?: Total Help needed walking in hospital room?: Total Help needed climbing 3-5 steps with a railing? : Total 6 Click Score: 6    End of Session Equipment Utilized During Treatment: Gait belt Activity Tolerance: Patient limited by pain Patient left: in bed;with call bell/phone within reach;with family/visitor present Nurse Communication: Mobility status PT Visit Diagnosis: Muscle weakness (generalized) (M62.81);Pain Pain - Right/Left: Left Pain - part of body: Leg    Time: 1050-1126 PT Time Calculation (min) (ACUTE ONLY): 36 min   Charges:   PT Evaluation $PT Eval Moderate Complexity: 1 Mod PT Treatments $Gait Training: 8-22 mins        Einar Crow, [place]  Student Physical Therapist Acute Rehab [phone]   Einar Crow 06/30/2018, 2:13 PM

## 2018-06-30 NOTE — Progress Notes (Signed)
PROGRESS NOTE    Sandra Ferrell  ASN:[id number] DOB: 08-17-1961 DOA: 06/28/2018 PCP: Jinny Sanders, MD    Brief Narrative: ) 57 year old with past medical history relevant for chronic pain syndrome, type 2 diabetes on insulin, asthma chronic diastolic heart failure with grade 1 diastolic dysfunction and echo on 03/01/2011 who was admitted with a mechanical fall and found to have displaced comminuted left distal femoral fracture status post ORIF with nail on 06/29/2018   Assessment & Plan:   Principal Problem:   Closed femur fracture ([place]) Active Problems:   Diabetes mellitus with no complication ([place])   Essential hypertension, benign   Anemia   Femur fracture, left (HCC)   #) Left femur fracture status post ORIF on 06/29/2018: Patient is improving. -Pain control - Continue apixaban 2.5 mg twice daily for 2 weeks for prophylaxis -PT consult for placement  #) Asthma: - Continue PRN short-acting bronchodilator  #) Type 2 diabetes on insulin: -Hold home glitazone 45 mg daily -Hold home metformin thousand milligrams daily -Continue glargine 35 units nightly -Sliding scale insulin  #) Pain/psych: -Continue amitriptyline 75 mg nightly -Continue PRN alprazolam 0.5 mg 3 times daily  #) Hyperlipidemia: -Continue gemfibrozil 600 mg twice daily  #) Chronic diastolic heart failure: -Continue furosemide 20 mg daily  Fluids: Tolerating p.o. Electrolyte: Monitor and supplement Nutrition: Carb restricted diet  Prophylaxis: Apixaban 2.5 mg twice daily  Disposition: Pending PT evaluation  Full code    Consultants:   Orthopedic surgery  Procedures:   06/29/2018 open reduction internal fixation of left distal femur  Antimicrobials:   Perioperative antibiotics   Subjective: She reports she has some pain.  She denies any nausea, vomiting, diarrhea, cough, congestion.  She does not work with physical therapy yet.  Objective: Vitals:   06/29/18 1400 06/29/18 1430  06/29/18 1938 06/30/18 0437  BP: (!) 147/65 (!) 149/76 (!) 147/65 (!) 99/49  Pulse: (!) 102 96 (!) 101 (!) 107  Resp: 16 15    Temp: 97.6 F (36.4 C) 98.4 F (36.9 C) 98.7 F (37.1 C) 98.6 F (37 C)  TempSrc:  Axillary Oral Oral  SpO2: 96% 96% 98% 97%  Weight:      Height:        Intake/Output Summary (Last 24 hours) at 06/30/2018 1037 Last data filed at 06/30/2018 0900 Gross per 24 hour  Intake 1390 ml  Output 100 ml  Net 1290 ml   Filed Weights   06/29/18 0148  Weight: 103.9 kg    Examination:  General exam: Appears calm and comfortable  Respiratory system: Clear to auscultation. Respiratory effort normal. Cardiovascular system: Regular rate and rhythm, no murmurs Gastrointestinal system: Abdomen is nondistended, soft and nontender. No organomegaly or masses felt. Normal bowel sounds heard. Central nervous system: Alert and oriented. No focal neurological deficits. Extremities: Left lower extremity distal pulses intact, movement limited by pain, trace lower extremity edema bilaterally Skin: Incision site is clean dry and intact Psychiatry: Judgement and insight appear normal. Mood & affect appropriate.     Data Reviewed: I have personally reviewed following labs and imaging studies  CBC: Recent Labs  Lab 06/28/18 2202 06/29/18 0512 06/30/18 0342  WBC 16.1* 13.1* 9.8  NEUTROABS 13.1*  --   --   HGB 11.5* 11.4* 9.5*  HCT 36.6 36.3 30.4*  MCV 96.6 95.3 96.2  PLT 348 357 193   Basic Metabolic Panel: Recent Labs  Lab 06/28/18 2202 06/29/18 0512 06/30/18 0342  NA 140 140 138  K 4.2  3.7 3.5  CL 104 107 106  CO2 22 24 25   GLUCOSE 154* 134* 117*  BUN 13 10 9   CREATININE 0.92 0.71 0.65  CALCIUM 8.4* 8.2* 8.1*   GFR: Estimated Creatinine Clearance: 78.3 mL/min (by C-G formula based on SCr of 0.65 mg/dL). Liver Function Tests: Recent Labs  Lab 06/29/18 0512 06/30/18 0342  AST 22 20  ALT 15 16  ALKPHOS 70 57  BILITOT 0.4 0.5  PROT 6.2* 5.5*    ALBUMIN 3.0* 2.6*   No results for input(s): LIPASE, AMYLASE in the last 168 hours. No results for input(s): AMMONIA in the last 168 hours. Coagulation Profile: Recent Labs  Lab 06/29/18 0512  INR 0.98   Cardiac Enzymes: No results for input(s): CKTOTAL, CKMB, CKMBINDEX, TROPONINI in the last 168 hours. BNP (last 3 results) No results for input(s): PROBNP in the last 8760 hours. HbA1C: No results for input(s): HGBA1C in the last 72 hours. CBG: Recent Labs  Lab 06/29/18 1014 06/29/18 1249 06/29/18 1626 06/29/18 1939 06/30/18 0003  GLUCAP 123* 142* 183* 157* 129*   Lipid Profile: No results for input(s): CHOL, HDL, LDLCALC, TRIG, CHOLHDL, LDLDIRECT in the last 72 hours. Thyroid Function Tests: No results for input(s): TSH, T4TOTAL, FREET4, T3FREE, THYROIDAB in the last 72 hours. Anemia Panel: No results for input(s): VITAMINB12, FOLATE, FERRITIN, TIBC, IRON, RETICCTPCT in the last 72 hours. Sepsis Labs: No results for input(s): PROCALCITON, LATICACIDVEN in the last 168 hours.  Recent Results (from the past 240 hour(s))  Surgical pcr screen     Status: None   Collection Time: 06/29/18  2:25 AM  Result Value Ref Range Status   MRSA, PCR NEGATIVE NEGATIVE Final   Staphylococcus aureus NEGATIVE NEGATIVE Final    Comment: (NOTE) The Xpert SA Assay (FDA approved for NASAL specimens in patients 45 years of age and older), is one component of a comprehensive surveillance program. It is not intended to diagnose infection nor to guide or monitor treatment. Performed at [facility], [place] [address] [place], [place] [address]          Radiology Studies: Dg Chest 1 View  Result Date: 06/28/2018 CLINICAL DATA:  Preop chest x-ray.  Femur fracture. EXAM: CHEST  1 VIEW COMPARISON:  Chest x-ray dated 05/20/2017. FINDINGS: The heart size is upper normal. Both lungs are clear. The visualized skeletal structures are unremarkable. IMPRESSION: No active disease.  Electronically Signed   By: Franki Cabot M.D.   On: 06/28/2018 22:09   Dg Knee Complete 4 Views Left  Result Date: 06/28/2018 CLINICAL DATA:  Slip on water at home leading to fall. Left hip and knee pain. EXAM: LEFT KNEE - COMPLETE 4+ VIEW COMPARISON:  None. FINDINGS: Comminuted and significantly displaced distal femur fracture with 17 mm posterior displacement of distal fracture fragment and 4.5 cm osseous overriding. No evidence of intra-articular extension or joint effusion. Degenerative change of the knee joint. There is patellar [place]. IMPRESSION: Significantly displaced and comminuted distal femoral shaft fracture, 4.5 cm osseous overriding. No evidence of intra-articular extension. Electronically Signed   By: Jeb Levering M.D.   On: 06/28/2018 22:15   Dg C-arm 1-60 Min  Result Date: 06/29/2018 CLINICAL DATA:  ORIF distal femur fracture EXAM: DG C-ARM 61-120 MIN; LEFT FEMUR 2 VIEWS COMPARISON:  06/28/2018 left femur radiographs FINDINGS: Fluoroscopy time 3 minutes 16 seconds. Nondiagnostic spot fluoroscopic intraoperative radiographs of the left femur demonstrate transfixation of the comminuted distal left femoral fracture with intramedullary rod with proximal and  distal interlocking screws. IMPRESSION: Intraoperative fluoroscopic guidance for ORIF distal left femur fracture. Electronically Signed   By: Ilona Sorrel M.D.   On: 06/29/2018 13:38   Dg Hip Unilat W Or Wo Pelvis 2-3 Views Left  Result Date: 06/28/2018 CLINICAL DATA:  Slip on water at home leading to fall. Left hip and knee pain. EXAM: DG HIP (WITH OR WITHOUT PELVIS) 2-3V LEFT COMPARISON:  Radiograph 04/06/2012 FINDINGS: The cortical margins of the bony pelvis and left hip are intact. No fracture. Pubic symphysis and sacroiliac joints are congruent. Mild left acetabular spurring. Both femoral heads are well-seated in the respective acetabula. Left distal femur fracture partially included in the field of view. IMPRESSION: No acute  fracture of the pelvis or left hip. Electronically Signed   By: Jeb Levering M.D.   On: 06/28/2018 22:07   Dg Femur Min 2 Views Left  Result Date: 06/29/2018 CLINICAL DATA:  ORIF distal femur fracture EXAM: DG C-ARM 61-120 MIN; LEFT FEMUR 2 VIEWS COMPARISON:  06/28/2018 left femur radiographs FINDINGS: Fluoroscopy time 3 minutes 16 seconds. Nondiagnostic spot fluoroscopic intraoperative radiographs of the left femur demonstrate transfixation of the comminuted distal left femoral fracture with intramedullary rod with proximal and distal interlocking screws. IMPRESSION: Intraoperative fluoroscopic guidance for ORIF distal left femur fracture. Electronically Signed   By: Ilona Sorrel M.D.   On: 06/29/2018 13:38        Scheduled Meds: . acetaminophen  500 mg Oral Q6H  . ALPRAZolam  0.5 mg Oral TID  . amitriptyline  75 mg Oral QHS  . apixaban  2.5 mg Oral BID  . docusate sodium  100 mg Oral BID  . furosemide  20 mg Oral Daily  . gemfibrozil  600 mg Oral BID AC  . insulin aspart  0-9 Units Subcutaneous Q4H  . insulin glargine  35 Units Subcutaneous QHS  . nicotine  14 mg Transdermal Daily  . traMADol  50 mg Oral Q6H   Continuous Infusions:   LOS: 1 day    Time spent: [place], MD [facility]  If 7PM-7AM, please contact night-coverage [email or web address] Password [facility] 06/30/2018, 10:37 AM

## 2018-07-01 LAB — CBC
HCT: 30.2 % — ABNORMAL LOW (ref 36.0–46.0)
Hemoglobin: 9.4 g/dL — ABNORMAL LOW (ref 12.0–15.0)
MCH: 29.7 pg (ref 26.0–34.0)
MCHC: 31.1 g/dL (ref 30.0–36.0)
MCV: 95.3 fL (ref 78.0–100.0)
Platelets: 277 K/uL (ref 150–400)
RBC: 3.17 MIL/uL — ABNORMAL LOW (ref 3.87–5.11)
RDW: 14.6 % (ref 11.5–15.5)
WBC: 10.4 10*3/uL (ref 4.0–10.5)

## 2018-07-01 LAB — GLUCOSE, CAPILLARY
GLUCOSE-CAPILLARY: 151 mg/dL — AB (ref 70–99)
GLUCOSE-CAPILLARY: 86 mg/dL (ref 70–99)
GLUCOSE-CAPILLARY: 92 mg/dL (ref 70–99)
Glucose-Capillary: 90 mg/dL (ref 70–99)
Glucose-Capillary: 90 mg/dL (ref 70–99)

## 2018-07-01 NOTE — Plan of Care (Signed)
  Problem: Education: Goal: Knowledge of General Education information will improve Description Including pain rating scale, medication(s)/side effects and non-pharmacologic comfort measures Outcome: Progressing Note:  POC reviewed with pt./husband.

## 2018-07-01 NOTE — Clinical Social Work Note (Signed)
Clinical Social Work Assessment  Patient Details  Name: Sandra Ferrell MRN: [id number] Date of Birth: Nov 24, 1960  Date of referral:  07/01/18               Reason for consult:  Facility Placement                Permission sought to share information with:  Family Supports, Customer service manager Permission granted to share information::  Yes, Verbal Permission Granted  Name::     Keiran Sias   Agency::  SNFs  Relationship::  Husband  Contact Information:  [phone]  Housing/Transportation Living arrangements for the past 2 months:  Single Family Home Source of Information:  Spouse Patient Interpreter Needed:  None Criminal Activity/Legal Involvement Pertinent to Current Situation/Hospitalization:  No - Comment as needed Significant Relationships:  Spouse Lives with:  Spouse, Other (Comment)(mother in-law) Do you feel safe going back to the place where you live?  No Need for family participation in patient care:  No (Coment)  Care giving concerns:  CSW received consult for discharge needs. CSW spoke with patient and her spouse regarding PT recommendation of SNF placement at time of discharge. Patient's spouse reported he takes of his mother whom lives in the home with him. Patient's spouse expressed a SNF near their home would lift the burden of traveling to a further SNF.    Social Worker assessment / plan:  CSW spoke with patient and her spouse concerning possibility of rehab at [facility] before returning home.  Employment status:  Other (Comment) Insurance information:  Medicare PT Recommendations:  [place] / Referral to community resources:  [place]  Patient/Family's Response to care: Patient recognizes need for rehab before returning home and is agreeable to a SNF in [place]. Patient reported preference for [facility]  Patient/Family's Understanding of and Emotional Response to Diagnosis, Current Treatment, and  Prognosis:  Patient/family is realistic regarding therapy needs and expressed being hopeful for SNF placement. Patient expressed understanding of CSW role and discharge process as well as medical condition. No questions/concerns about plan or treatment at this time.  Emotional Assessment Appearance:  Appears stated age Attitude/Demeanor/Rapport:  Other(tired, friendly) Affect (typically observed):  Accepting, Calm, Appropriate Orientation:  Oriented to Self, Oriented to Place, Oriented to  Time, Oriented to Situation Alcohol / Substance use:  Not Applicable Psych involvement (Current and /or in the community):  No (Comment)  Discharge Needs  Concerns to be addressed:  Care Coordination Readmission within the last 30 days:  No Current discharge risk:  None Barriers to Discharge:  Continued Medical Work up   [employer], [place] 07/01/2018, 4:17 PM

## 2018-07-01 NOTE — Progress Notes (Signed)
PROGRESS NOTE    Sandra Ferrell  TIR:[id number] DOB: [birth year] DOA: 06/28/2018 PCP: Jinny Sanders, MD    Brief Narrative: ) 57 year old with past medical history relevant for chronic pain syndrome, type 2 diabetes on insulin, asthma chronic diastolic heart failure with grade 1 diastolic dysfunction and echo on 03/01/2011 who was admitted with a mechanical fall and found to have displaced comminuted left distal femoral fracture status post ORIF with nail on 06/29/2018   Assessment & Plan:   Principal Problem:   Closed femur fracture ([place]) Active Problems:   Diabetes mellitus with no complication ([place])   Essential hypertension, benign   Anemia   Femur fracture, left (HCC)   #) Left femur fracture status post ORIF on 06/29/2018: Patient is improving. -Pain control - Continue apixaban 2.5 mg twice daily for 2 weeks for prophylaxis -PT consult recommends skilled nursing placement, social work is been consulted  #) Asthma: - Continue PRN short-acting bronchodilator  #) Type 2 diabetes on insulin: -Hold home glitazone 45 mg daily -Hold home metformin thousand milligrams daily -Continue glargine 35 units nightly -Sliding scale insulin  #) Pain/psych: -Continue amitriptyline 75 mg nightly -Continue PRN alprazolam 0.5 mg 3 times daily  #) Hyperlipidemia: -Continue gemfibrozil 600 mg twice daily  #) Chronic diastolic heart failure: -Continue furosemide 20 mg daily  Fluids: Tolerating p.o. Electrolyte: Monitor and supplement Nutrition: Carb restricted diet  Prophylaxis: Apixaban 2.5 mg twice daily  Disposition: Pending nursing home placement  Full code    Consultants:   Orthopedic surgery  Procedures:   06/29/2018 open reduction internal fixation of left distal femur  Antimicrobials:   Perioperative antibiotics   Subjective: Patient reports she has some pain.  She denies any nausea, vomiting, diarrhea, cough, congestion, rhinorrhea.  Objective: Vitals:   06/30/18 1139 06/30/18 1544 06/30/18 2127 07/01/18 0407  BP: (!) 134/57 (!) 118/50 (!) 122/57 132/62  Pulse: 99 (!) 101 (!) 111 (!) 109  Resp: 20 18 18 18   Temp: 97.7 F (36.5 C) 98.5 F (36.9 C) 97.9 F (36.6 C) 99.1 F (37.3 C)  TempSrc: Oral Oral Oral Oral  SpO2: 100% 97% 92% 90%  Weight:      Height:        Intake/Output Summary (Last 24 hours) at 07/01/2018 0953 Last data filed at 06/30/2018 1837 Gross per 24 hour  Intake 220 ml  Output -  Net 220 ml   Filed Weights   06/29/18 0148  Weight: 103.9 kg    Examination:  General exam: Appears calm and comfortable  Respiratory system: Clear to auscultation. Respiratory effort normal. Cardiovascular system: Regular rate and rhythm, no murmurs Gastrointestinal system: Abdomen is nondistended, soft and nontender. No organomegaly or masses felt. Normal bowel sounds heard. Central nervous system: Alert and oriented. No focal neurological deficits. Extremities: Left lower extremity distal pulses intact, movement limited by pain, trace lower extremity edema bilaterally Skin: Incision site is clean dry and intact Psychiatry: Judgement and insight appear normal. Mood & affect appropriate.     Data Reviewed: I have personally reviewed following labs and imaging studies  CBC: Recent Labs  Lab 06/28/18 2202 06/29/18 0512 06/30/18 0342 07/01/18 0620  WBC 16.1* 13.1* 9.8 10.4  NEUTROABS 13.1*  --   --   --   HGB 11.5* 11.4* 9.5* 9.4*  HCT 36.6 36.3 30.4* 30.2*  MCV 96.6 95.3 96.2 95.3  PLT 348 357 286 676   Basic Metabolic Panel: Recent Labs  Lab 06/28/18 2202 06/29/18 0512 06/30/18 0342  NA 140 140 138  K 4.2 3.7 3.5  CL 104 107 106  CO2 22 24 25   GLUCOSE 154* 134* 117*  BUN 13 10 9   CREATININE 0.92 0.71 0.65  CALCIUM 8.4* 8.2* 8.1*   GFR: Estimated Creatinine Clearance: 78.3 mL/min (by C-G formula based on SCr of 0.65 mg/dL). Liver Function Tests: Recent Labs  Lab 06/29/18 0512 06/30/18 0342  AST 22 20    ALT 15 16  ALKPHOS 70 57  BILITOT 0.4 0.5  PROT 6.2* 5.5*  ALBUMIN 3.0* 2.6*   No results for input(s): LIPASE, AMYLASE in the last 168 hours. No results for input(s): AMMONIA in the last 168 hours. Coagulation Profile: Recent Labs  Lab 06/29/18 0512  INR 0.98   Cardiac Enzymes: No results for input(s): CKTOTAL, CKMB, CKMBINDEX, TROPONINI in the last 168 hours. BNP (last 3 results) No results for input(s): PROBNP in the last 8760 hours. HbA1C: No results for input(s): HGBA1C in the last 72 hours. CBG: Recent Labs  Lab 06/30/18 1625 06/30/18 2125 07/01/18 0012 07/01/18 0405 07/01/18 0732  GLUCAP 129* 125* 151* 90 86   Lipid Profile: No results for input(s): CHOL, HDL, LDLCALC, TRIG, CHOLHDL, LDLDIRECT in the last 72 hours. Thyroid Function Tests: No results for input(s): TSH, T4TOTAL, FREET4, T3FREE, THYROIDAB in the last 72 hours. Anemia Panel: No results for input(s): VITAMINB12, FOLATE, FERRITIN, TIBC, IRON, RETICCTPCT in the last 72 hours. Sepsis Labs: No results for input(s): PROCALCITON, LATICACIDVEN in the last 168 hours.  Recent Results (from the past 240 hour(s))  Surgical pcr screen     Status: None   Collection Time: 06/29/18  2:25 AM  Result Value Ref Range Status   MRSA, PCR NEGATIVE NEGATIVE Final   Staphylococcus aureus NEGATIVE NEGATIVE Final    Comment: (NOTE) The Xpert SA Assay (FDA approved for NASAL specimens in patients 75 years of age and older), is one component of a comprehensive surveillance program. It is not intended to diagnose infection nor to guide or monitor treatment. Performed at [facility], [place] [address], [place], [place] [address]          Radiology Studies: Dg C-arm 1-60 Min  Result Date: 06/29/2018 CLINICAL DATA:  ORIF distal femur fracture EXAM: DG C-ARM 61-120 MIN; LEFT FEMUR 2 VIEWS COMPARISON:  06/28/2018 left femur radiographs FINDINGS: Fluoroscopy time 3 minutes 16 seconds. Nondiagnostic spot  fluoroscopic intraoperative radiographs of the left femur demonstrate transfixation of the comminuted distal left femoral fracture with intramedullary rod with proximal and distal interlocking screws. IMPRESSION: Intraoperative fluoroscopic guidance for ORIF distal left femur fracture. Electronically Signed   By: Ilona Sorrel M.D.   On: 06/29/2018 13:38   Dg Femur Min 2 Views Left  Result Date: 06/30/2018 CLINICAL DATA:  Status post ORIF of left femur fracture. EXAM: LEFT FEMUR 2 VIEWS COMPARISON:  Left hip and knee radiographs 06/28/2018. Intraoperative fluoroscopic images [phone]. FINDINGS: There has been ORIF of the previously described comminuted distal femoral shaft fracture. A retrograde intramedullary nail is present with multiple proximal and distal interlocking screws. Alignment of the main proximal and distal fragments is nearly anatomic. Comminuted fragments demonstrate mild displacement. The left hip and left knee are grossly located. A large left knee joint effusion is present. Soft tissue gas and skin staples are noted. IMPRESSION: ORIF of distal left femoral shaft fracture as above. Electronically Signed   By: Logan Bores M.D.   On: 06/30/2018 10:41   Dg Femur Min 2 Views Left  Result  Date: 06/29/2018 CLINICAL DATA:  ORIF distal femur fracture EXAM: DG C-ARM 61-120 MIN; LEFT FEMUR 2 VIEWS COMPARISON:  06/28/2018 left femur radiographs FINDINGS: Fluoroscopy time 3 minutes 16 seconds. Nondiagnostic spot fluoroscopic intraoperative radiographs of the left femur demonstrate transfixation of the comminuted distal left femoral fracture with intramedullary rod with proximal and distal interlocking screws. IMPRESSION: Intraoperative fluoroscopic guidance for ORIF distal left femur fracture. Electronically Signed   By: Ilona Sorrel M.D.   On: 06/29/2018 13:38        Scheduled Meds: . ALPRAZolam  0.5 mg Oral TID  . amitriptyline  75 mg Oral QHS  . apixaban  2.5 mg Oral BID  . docusate sodium   100 mg Oral BID  . furosemide  20 mg Oral Daily  . gemfibrozil  600 mg Oral BID AC  . insulin aspart  0-9 Units Subcutaneous Q4H  . insulin glargine  35 Units Subcutaneous QHS  . nicotine  14 mg Transdermal Daily  . traMADol  50 mg Oral Q6H   Continuous Infusions:   LOS: 2 days    Time spent: [place], MD [facility]  If 7PM-7AM, please contact night-coverage [email or web address] Password [facility] 07/01/2018, 9:53 AM

## 2018-07-01 NOTE — NC FL2 (Signed)
[place] LEVEL OF CARE SCREENING TOOL     IDENTIFICATION  Patient Name: Sandra Ferrell Birthdate: 1961-08-14 Sex: female Admission Date (Current Location): 06/28/2018  [facility] and [place] Number:  Herbalist and Address:  [facility], [place] [address], [place], [place] [address]      Provider Number: [id number]  Attending Physician Name and Address:  Cristy Folks, MD  Relative Name and Phone Number:  Jeneen Rinks, spouse, [phone]    Current Level of Care: Hospital Recommended Level of Care: [place] Prior Approval Number:    Date Approved/Denied:   PASRR Number: [id number]  Discharge Plan: SNF    Current Diagnoses: Patient Active Problem List   Diagnosis Date Noted  . Closed femur fracture ([place]) 06/29/2018  . Anemia 06/29/2018  . Femur fracture, left ([place]) 06/29/2018  . Leg cramping 09/04/2017  . Bilateral carpal tunnel syndrome 04/16/2017  . Bilateral leg weakness 03/07/2017  . Muscular deconditioning 03/07/2017  . Frequent falls 03/05/2017  . History of fusion of cervical spine 03/05/2017  . History of lumbar fusion 03/05/2017  . COPD exacerbation ([place]) 10/01/2016  . COPD, moderate ([place]) 03/22/2016  . Counseling regarding end of life decision making 09/13/2015  . Microalbuminuria 05/19/2015  . Unspecified constipation 07/10/2013  . Essential hypertension, benign 02/21/2011  . UTI'S, HX OF 12/26/2010  . Chronic low back pain 02/22/2009  . Diabetes mellitus with no complication ([place]) 76/16/0737  . ALLERGIC RHINITIS 06/24/2007  . RENAL CALCULUS 06/24/2007  . OSTEOARTHRITIS 06/24/2007  . GAD (generalized anxiety disorder) 04/03/2007  . Hyperlipidemia LDL goal <100 03/12/2007  . TOBACCO ABUSE 03/12/2007  . Major depression, recurrent ([place]) 03/12/2007  . GERD 03/12/2007  . Fibromyalgia 03/12/2007  . SLEEP APNEA 03/12/2007    Orientation RESPIRATION BLADDER Height & Weight     Self, Time,  Situation, Place  O2(Nasal cannula 2L) Incontinent, External catheter Weight: 103.9 kg Height:  4\' 9"  (144.8 cm)  BEHAVIORAL SYMPTOMS/MOOD NEUROLOGICAL BOWEL NUTRITION STATUS      Continent Diet(Please see DC Summary)  AMBULATORY STATUS COMMUNICATION OF NEEDS Skin   Extensive Assist Verbally Surgical wounds(Closed incision on leg)                       Personal Care Assistance Level of Assistance  Bathing, Feeding, Dressing Bathing Assistance: Maximum assistance Feeding assistance: Independent Dressing Assistance: Limited assistance     Functional Limitations Info  Hearing, Sight, Speech Sight Info: Impaired Hearing Info: Adequate Speech Info: Adequate    SPECIAL CARE FACTORS FREQUENCY  PT (By licensed PT), OT (By licensed OT)     PT Frequency: 5x/week OT Frequency: 3x/week            Contractures Contractures Info: Not present    Additional Factors Info  Code Status, Allergies, Psychotropic, Insulin Sliding Scale Code Status Info: Full Allergies Info: Benazepril, Diclofenac Sodium, Fluticasone-salmeterol, Nsaids, Penicillins, Aspirin, Propoxyphene Psychotropic Info: Xanax Insulin Sliding Scale Info: Every 4 hours       Current Medications (07/01/2018):  This is the current hospital active medication list Current Facility-Administered Medications  Medication Dose Route Frequency Provider Last Rate Last Dose  . acetaminophen (TYLENOL) tablet 650 mg  650 mg Oral Q6H PRN Milly Jakob, MD       Or  . acetaminophen (TYLENOL) suppository 650 mg  650 mg Rectal Q6H PRN Milly Jakob, MD      . albuterol (PROVENTIL) (2.5 MG/3ML) 0.083% nebulizer solution 2.5 mg  2.5 mg Nebulization Q4H PRN Milly Jakob, MD      . ALPRAZolam Duanne Moron) tablet 0.5 mg  0.5 mg Oral TID Milly Jakob, MD   0.5 mg at 07/01/18 0850  . alum & mag hydroxide-simeth (MAALOX/MYLANTA) 200-200-20 MG/5ML suspension 15 mL  15 mL Oral Q4H PRN Purohit, Shrey C, MD   15 mL at 06/30/18 0832  .  amitriptyline (ELAVIL) tablet 75 mg  75 mg Oral QHS Milly Jakob, MD   75 mg at 06/30/18 2124  . apixaban (ELIQUIS) tablet 2.5 mg  2.5 mg Oral BID Milly Jakob, MD   2.5 mg at 07/01/18 0849  . diphenhydrAMINE (BENADRYL) injection 25 mg  25 mg Intravenous Q6H PRN Milly Jakob, MD      . docusate sodium (COLACE) capsule 100 mg  100 mg Oral BID Milly Jakob, MD   100 mg at 07/01/18 0851  . fentaNYL (SUBLIMAZE) injection 50 mcg  50 mcg Intravenous Q1H PRN Milly Jakob, MD   50 mcg at 06/29/18 0607  . furosemide (LASIX) tablet 20 mg  20 mg Oral Daily Milly Jakob, MD   20 mg at 07/01/18 8828  . gemfibrozil (LOPID) tablet 600 mg  600 mg Oral BID AC Milly Jakob, MD   600 mg at 07/01/18 0851  . hydrALAZINE (APRESOLINE) injection 10 mg  10 mg Intravenous Q6H PRN Milly Jakob, MD      . HYDROcodone-acetaminophen (NORCO/VICODIN) 5-325 MG per tablet 1-2 tablet  1-2 tablet Oral Q4H PRN Milly Jakob, MD   2 tablet at 07/01/18 0439  . HYDROmorphone (DILAUDID) injection 0.5 mg  0.5 mg Intravenous Q4H PRN Milly Jakob, MD   0.5 mg at 06/29/18 1742  . insulin aspart (novoLOG) injection 0-9 Units  0-9 Units Subcutaneous Q4H Milly Jakob, MD   2 Units at 07/01/18 0026  . insulin glargine (LANTUS) injection 35 Units  35 Units Subcutaneous QHS Cristy Folks, MD   35 Units at 06/30/18 2133  . magnesium hydroxide (MILK OF MAGNESIA) suspension 30 mL  30 mL Oral Daily PRN Milly Jakob, MD      . metoCLOPramide [facility]) tablet 5-10 mg  5-10 mg Oral Q8H PRN Milly Jakob, MD   5 mg at 06/30/18 2125   Or  . metoCLOPramide (REGLAN) injection 5-10 mg  5-10 mg Intravenous Q8H PRN Milly Jakob, MD      . morphine 2 MG/ML injection 0.5-1 mg  0.5-1 mg Intravenous Q2H PRN Milly Jakob, MD      . nicotine (NICODERM CQ - dosed in mg/24 hours) patch 14 mg  14 mg Transdermal Daily Milly Jakob, MD   14 mg at 07/01/18 0850  . ondansetron (ZOFRAN) injection 4 mg  4 mg Intravenous Q6H PRN  Milly Jakob, MD   4 mg at 06/29/18 0205  . traMADol (ULTRAM) tablet 50 mg  50 mg Oral Q6H Milly Jakob, MD   50 mg at 07/01/18 0034     Discharge Medications: Please see discharge summary for a list of discharge medications.  Relevant Imaging Results:  Relevant Lab Results:   Additional Information SSN: [place], [place]

## 2018-07-01 NOTE — Evaluation (Signed)
Occupational Therapy Evaluation Patient Details Name: Sandra Ferrell MRN: [id number] DOB: 08/11/1961 Today's Date: 07/01/2018    History of Present Illness Pt is a 57 y.o. female s/p ORIF on L. distal femur. Pt is touchdown weight bearing on LLE. PMH significant of T2DM, HTN, asthma/COPD.    Clinical Impression   Pt currently needs total assist +2 for supine to sit and for transfers squat pivot to the right to the bedside recliner.  Pt also needs +2 assist for toilet transfers simulated and LB selfcare sit to stand secondary to not being able to follow her weightbearing status and support her weight with the RLE.  Will benefit from acute care OT to help begin rehab process and progress to a more independent level but anticipate extended rehab at [facility].      Follow Up Recommendations  SNF;Supervision/Assistance - 24 hour    Equipment Recommendations  Other (comment)(TBD next venue of care)       Precautions / Restrictions Precautions Precautions: Fall Restrictions Weight Bearing Restrictions: Yes LLE Weight Bearing: Touchdown weight bearing      Mobility Bed Mobility Overal bed mobility: Needs Assistance Bed Mobility: Supine to Sit     Supine to sit: Max assist;+2 for physical assistance     General bed mobility comments: Pt needed assist with all aspects of supine to sit this session.  Transfers Overall transfer level: Needs assistance   Transfers: Squat Pivot Transfers     Squat pivot transfers: Total assist;+2 physical assistance          Balance Overall balance assessment: Needs assistance   Sitting balance-Leahy Scale: Fair       Standing balance-Leahy Scale: Zero                             ADL either performed or assessed with clinical judgement   ADL Overall ADL's : Needs assistance/impaired Eating/Feeding: Independent   Grooming: Wash/dry hands;Wash/dry face;Set up;Sitting   Upper Body Bathing: Set up;Sitting   Lower Body Bathing:  +2 for physical assistance;Total assistance   Upper Body Dressing : Minimal assistance;Sitting   Lower Body Dressing: Total assistance;+2 for physical assistance   Toilet Transfer: +2 for physical assistance;Squat-pivot;Total assistance   Toileting- Clothing Manipulation and Hygiene: +2 for physical assistance;Total assistance         General ADL Comments: Pt total +2 (pt 15%) for squat pivot transfer to the right side to recliner.  Will likely need extensive SNF level rehab at discharge secondary to weightbearing restrictions and current functional level.     Vision Baseline Vision/History: Wears glasses Wears Glasses: At all times Patient Visual Report: No change from baseline              Pertinent Vitals/Pain Pain Assessment: Faces Faces Pain Scale: Hurts little more Pain Location: left hip Pain Descriptors / Indicators: Grimacing Pain Intervention(s): Limited activity within patient's tolerance;Monitored during session     Hand Dominance Left   Extremity/Trunk Assessment Upper Extremity Assessment Upper Extremity Assessment: Generalized weakness   Lower Extremity Assessment Lower Extremity Assessment: Defer to PT evaluation   Cervical / Trunk Assessment Cervical / Trunk Assessment: (history of cervical surgeries)   Communication Communication Communication: No difficulties   Cognition Arousal/Alertness: Awake/alert Behavior During Therapy: Anxious Overall Cognitive Status: Within Functional Limits for tasks assessed  Home Living Family/patient expects to be discharged to:: Skilled nursing facility Living Arrangements: Spouse/significant other                                      Prior Functioning/Environment Level of Independence: Independent with assistive device(s)        Comments: Utilizing cane and/or rollator intermittently.         OT Problem List:  Pain;Impaired balance (sitting and/or standing);Decreased strength;Obesity;Decreased activity tolerance;Decreased knowledge of use of DME or AE      OT Treatment/Interventions: Self-care/ADL training;Patient/family education;Therapeutic exercise;Balance training;Therapeutic activities;DME and/or AE instruction    OT Goals(Current goals can be found in the care plan section) Acute Rehab OT Goals Patient Stated Goal: To get back to moving better OT Goal Formulation: With patient/family Time For Goal Achievement: 07/15/18 Potential to Achieve Goals: Fair  OT Frequency: Min 2X/week              AM-PAC PT "6 Clicks" Daily Activity     Outcome Measure Help from another person eating meals?: None Help from another person taking care of personal grooming?: A Little Help from another person toileting, which includes using toliet, bedpan, or urinal?: Total Help from another person bathing (including washing, rinsing, drying)?: Total Help from another person to put on and taking off regular upper body clothing?: A Little Help from another person to put on and taking off regular lower body clothing?: Total 6 Click Score: 13   End of Session Nurse Communication: Mobility status;Need for lift equipment  Activity Tolerance: Other (comment)(pt limited by nausea) Patient left: in chair;with call bell/phone within reach;with family/visitor present  OT Visit Diagnosis: Unsteadiness on feet (R26.81);Repeated falls (R29.6);Muscle weakness (generalized) (M62.81)                Time: 1448-1856 OT Time Calculation (min): 45 min Charges:  OT General Charges $OT Visit: 1 Visit OT Evaluation $OT Eval Moderate Complexity: 1 Mod OT Treatments $Self Care/Home Management : 23-37 mins   Jairus Tonne OTR/L 07/01/2018, 3:47 PM

## 2018-07-01 NOTE — Progress Notes (Signed)
  PATIENT ID: KAIJA KOVACEVIC  MRN: [id number]  DOB/AGE:  Oct 16, 1961 / 57 y.o.  2 Days Post-Op Procedure(s) (LRB): OPEN REDUCTION INTERNAL FIXATION (ORIF) DISTAL FEMUR FRACTURE (Left)  Subjective: Pain is moderate and reasonbly well-controlled.  No c/o chest pain or SOB.   Lying in bed, eating, PT rec SNF   Objective: Vital signs in last 24 hours: Temp:  [97.9 F (36.6 C)-99.1 F (37.3 C)] 99.1 F (37.3 C) (08/27 0407) Pulse Rate:  [108-111] 110 (08/27 1520) Resp:  [18] 18 (08/27 0407) BP: (122-158)/(57-76) 158/76 (08/27 1520) SpO2:  [90 %-100 %] 100 % (08/27 1520)  Intake/Output from previous day: 08/26 0701 - 08/27 0700 In: 460 [P.O.:460] Out: -  Intake/Output this shift: No intake/output data recorded.  Recent Labs    06/28/18 2202 06/29/18 0512 06/30/18 0342 07/01/18 0620  HGB 11.5* 11.4* 9.5* 9.4*   Recent Labs    06/30/18 0342 07/01/18 0620  WBC 9.8 10.4  RBC 3.16* 3.17*  HCT 30.4* 30.2*  PLT 286 277   Recent Labs    06/29/18 0512 06/30/18 0342  NA 140 138  K 3.7 3.5  CL 107 106  CO2 24 25  BUN 10 9  CREATININE 0.71 0.65  GLUCOSE 134* 117*  CALCIUM 8.2* 8.1*   Recent Labs    06/29/18 0512  INR 0.98    Physical Exam: NVI Dressings c/d/i externally  Assessment/Plan: 2 Days Post-Op Procedure(s) (LRB): OPEN REDUCTION INTERNAL FIXATION (ORIF) DISTAL FEMUR FRACTURE (Left)   Up with therapy Touch Down Weight Bearing (TDWB) L LE VTE prophylaxis: Eliquis x 2 weeks, started today D/C-->SNF, awaiting transfer May d/c from hospital from Ortho POV once deemed medically fit and appropriate d/c plan in place RTC 2 weeks postop  Shanon Brow A. [place] Silos, [place],   [address] Office: [phone] Mobile: [phone]  07/01/2018, 5:08 PM

## 2018-07-02 DIAGNOSIS — S72402A Unspecified fracture of lower end of left femur, initial encounter for closed fracture: Secondary | ICD-10-CM

## 2018-07-02 LAB — GLUCOSE, CAPILLARY
GLUCOSE-CAPILLARY: 156 mg/dL — AB (ref 70–99)
Glucose-Capillary: 143 mg/dL — ABNORMAL HIGH (ref 70–99)
Glucose-Capillary: 149 mg/dL — ABNORMAL HIGH (ref 70–99)
Glucose-Capillary: 173 mg/dL — ABNORMAL HIGH (ref 70–99)
Glucose-Capillary: 76 mg/dL (ref 70–99)
Glucose-Capillary: 85 mg/dL (ref 70–99)

## 2018-07-02 LAB — CBC
HCT: 28.3 % — ABNORMAL LOW (ref 36.0–46.0)
Hemoglobin: 9.1 g/dL — ABNORMAL LOW (ref 12.0–15.0)
MCH: 30 pg (ref 26.0–34.0)
MCHC: 32.2 g/dL (ref 30.0–36.0)
MCV: 93.4 fL (ref 78.0–100.0)
Platelets: 275 10*3/uL (ref 150–400)
RBC: 3.03 MIL/uL — ABNORMAL LOW (ref 3.87–5.11)
RDW: 14.3 % (ref 11.5–15.5)
WBC: 8.5 10*3/uL (ref 4.0–10.5)

## 2018-07-02 MED ORDER — INSULIN ASPART 100 UNIT/ML ~~LOC~~ SOLN
0.0000 [IU] | Freq: Three times a day (TID) | SUBCUTANEOUS | Status: DC
  Administered 2018-07-03: 2 [IU] via SUBCUTANEOUS

## 2018-07-02 NOTE — Progress Notes (Signed)
Physical Therapy Treatment Patient Details Name: Sandra Ferrell MRN: [id number] DOB: April 18, 1961 Today's Date: 07/02/2018    History of Present Illness Pt is a 57 y.o. female s/p ORIF on L. distal femur. Pt is touchdown weight bearing on LLE. PMH significant of T2DM, HTN, asthma/COPD.     PT Comments    Pt received in bed, agreeable to participation in therapy. LE exercises performed supine. Pt required max assist supine to sit. She sat EOB x 12 min with supervision. Max assist required for sit to supine, and +2 total assist using bed pad to scoot up in bed.  Pt declined sitting up in recliner. She tolerated sitting in recliner yesterday for 45 minutes. Recommend maximove for bed to recliner transfers.   Follow Up Recommendations  SNF;Supervision/Assistance - 24 hour     Equipment Recommendations       Recommendations for Other Services       Precautions / Restrictions Precautions Precautions: Fall Restrictions LLE Weight Bearing: Touchdown weight bearing    Mobility  Bed Mobility Overal bed mobility: Needs Assistance Bed Mobility: Supine to Sit;Sit to Supine     Supine to sit: Max assist;HOB elevated Sit to supine: Max assist   General bed mobility comments: +rail, cues for sequencing, increased time and effort, use of bed pad to scoot to EOB  Transfers                 General transfer comment: Pt declining OOB, stating she only tolerated sitting in recliner 45 minutes yesterday.  Ambulation/Gait             General Gait Details: unable   Stairs             Wheelchair Mobility    Modified Rankin (Stroke Patients Only)       Balance Overall balance assessment: Needs assistance Sitting-balance support: Feet supported;Single extremity supported Sitting balance-Leahy Scale: Fair Sitting balance - Comments: Pt sat EOB x 12 minutes supervision.                                    Cognition Arousal/Alertness:  Awake/alert Behavior During Therapy: Anxious Overall Cognitive Status: Within Functional Limits for tasks assessed                                        Exercises Total Joint Exercises Ankle Circles/Pumps: AROM;Both;10 reps Quad Sets: AROM;Left;10 reps Heel Slides: AAROM;Left;10 reps Hip ABduction/ADduction: AAROM;Left;10 reps    General Comments General comments (skin integrity, edema, etc.): Spouse present during session.      Pertinent Vitals/Pain Pain Assessment: 0-10 Pain Score: 7  Pain Location: left hip Pain Descriptors / Indicators: Sore;Grimacing;Guarding Pain Intervention(s): Limited activity within patient's tolerance;Monitored during session;Patient requesting pain meds-RN notified    Home Living                      Prior Function            PT Goals (current goals can now be found in the care plan section) Acute Rehab PT Goals Patient Stated Goal: To get back to moving better PT Goal Formulation: With patient/family Time For Goal Achievement: 07/14/18 Potential to Achieve Goals: Good Progress towards PT goals: Progressing toward goals    Frequency    Min 3X/week  PT Plan Current plan remains appropriate    Co-evaluation              AM-PAC PT "6 Clicks" Daily Activity  Outcome Measure  Difficulty turning over in bed (including adjusting bedclothes, sheets and blankets)?: Unable Difficulty moving from lying on back to sitting on the side of the bed? : Unable Difficulty sitting down on and standing up from a chair with arms (e.g., wheelchair, bedside commode, etc,.)?: Unable Help needed moving to and from a bed to chair (including a wheelchair)?: A Lot Help needed walking in hospital room?: Total Help needed climbing 3-5 steps with a railing? : Total 6 Click Score: 7    End of Session Equipment Utilized During Treatment: Oxygen Activity Tolerance: Patient limited by pain;Other (comment)(anxiety) Patient  left: in bed;with call bell/phone within reach;with family/visitor present Nurse Communication: Mobility status;Need for lift equipment PT Visit Diagnosis: Muscle weakness (generalized) (M62.81);Pain Pain - Right/Left: Left Pain - part of body: Leg     Time: 5188-4166 PT Time Calculation (min) (ACUTE ONLY): 27 min  Charges:  $Therapeutic Exercise: 8-22 mins $Therapeutic Activity: 8-22 mins                     Lorrin Goodell, PT  Office # [phone] Pager [phone]    Lorriane Shire 07/02/2018, 10:12 AM

## 2018-07-02 NOTE — Progress Notes (Signed)
PROGRESS NOTE    Sandra Ferrell  ZOX:[id number] DOB: 1961/05/14 DOA: 06/28/2018 PCP: Jinny Sanders, MD   Brief Narrative: Patient is a 57 year old female with past medical history of chronic pain syndrome, type 2 diabetes mellitus on insulin, asthma, COPD, chronic diastolic CHF was admitted with a mechanical fall and was found to have displaced comminuted left distal femur fracture.  She is status post open reduction and internal fixation for the distal femur fracture.  Patient is stable for discharge to skilled nursing facility as soon as the bed is available.  Assessment & Plan:   Principal Problem:   Closed femur fracture ([place]) Active Problems:   Diabetes mellitus with no complication (HCC)   Essential hypertension, benign   Anemia   Femur fracture, left (HCC)  Left femur fracture: Status post ORIF on 06/29/2018.  Continue pain management.  Continue Eliquis for DVT prophylaxis.  PT recommending skilled nursing facility placement.  Social worker on board  Asthma/COPD: Has chronic wheezes.  Continue PRN bronchodilators.  Not on home oxygen.  Diabetes mellitus: On insulin at home along with other anti-hypoglycemic agents.  Continue sliding scale insulin here.  Hyperlipidemia: Continue gemfibrozil  Chronic diastolic heart failure: Continue Lasix 20 mg daily.   DVT prophylaxis: Eliquis Code Status: Full Family Communication: Husband present at the bedside. Disposition Plan: Skilled nursing facility tomorrow   Consultants: Orthopedics  Procedures: ORIF  Antimicrobials: None  Subjective:  Patient seen and examined the bedside this afternoon.   Complains of some pain on the fracture site but otherwise comfortable.  Waiting for skilled nursing facility bed. Objective: Vitals:   07/01/18 1520 07/01/18 2022 07/02/18 0600 07/02/18 0944  BP: (!) 158/76 (!) 140/55 138/65 (!) 152/59  Pulse: (!) 110 (!) 109 97 99  Resp:  18 16   Temp:  97.6 F (36.4 C) 97.9 F (36.6 C) 98.4  F (36.9 C)  TempSrc:  Oral Oral Oral  SpO2: 100% 93% 100% 100%  Weight:      Height:        Intake/Output Summary (Last 24 hours) at 07/02/2018 1436 Last data filed at 07/02/2018 0200 Gross per 24 hour  Intake 240 ml  Output -  Net 240 ml   Filed Weights   06/29/18 0148  Weight: 103.9 kg    Examination:  General exam: Appears calm and comfortable ,Not in distress,average built HEENT:PERRL,Oral mucosa moist, Ear/Nose normal on gross exam Respiratory system: Bilateral mild expiratory wheezes  cardiovascular system: S1 & S2 heard, RRR. No JVD, murmurs, rubs, gallops or clicks. No pedal edema. Gastrointestinal system: Abdomen is nondistended, soft and nontender. No organomegaly or masses felt. Normal bowel sounds heard. Central nervous system: Alert and oriented. No focal neurological deficits. Extremities: No edema, no clubbing ,no cyanosis, distal peripheral pulses palpable.  Clean surgical wound on the left thigh covered  with dressing Skin: No rashes, lesions or ulcers,no icterus ,no pallor MSK: Normal muscle bulk,tone ,power Psychiatry: Judgement and insight appear normal. Mood & affect appropriate.     Data Reviewed: I have personally reviewed following labs and imaging studies  CBC: Recent Labs  Lab 06/28/18 2202 06/29/18 0512 06/30/18 0342 07/01/18 0620 07/02/18 0408  WBC 16.1* 13.1* 9.8 10.4 8.5  NEUTROABS 13.1*  --   --   --   --   HGB 11.5* 11.4* 9.5* 9.4* 9.1*  HCT 36.6 36.3 30.4* 30.2* 28.3*  MCV 96.6 95.3 96.2 95.3 93.4  PLT 348 357 286 277 811   Basic Metabolic Panel: Recent  Labs  Lab 06/28/18 2202 06/29/18 0512 06/30/18 0342  NA 140 140 138  K 4.2 3.7 3.5  CL 104 107 106  CO2 22 24 25   GLUCOSE 154* 134* 117*  BUN 13 10 9   CREATININE 0.92 0.71 0.65  CALCIUM 8.4* 8.2* 8.1*   GFR: Estimated Creatinine Clearance: 78.3 mL/min (by C-G formula based on SCr of 0.65 mg/dL). Liver Function Tests: Recent Labs  Lab 06/29/18 0512 06/30/18 0342    AST 22 20  ALT 15 16  ALKPHOS 70 57  BILITOT 0.4 0.5  PROT 6.2* 5.5*  ALBUMIN 3.0* 2.6*   No results for input(s): LIPASE, AMYLASE in the last 168 hours. No results for input(s): AMMONIA in the last 168 hours. Coagulation Profile: Recent Labs  Lab 06/29/18 0512  INR 0.98   Cardiac Enzymes: No results for input(s): CKTOTAL, CKMB, CKMBINDEX, TROPONINI in the last 168 hours. BNP (last 3 results) No results for input(s): PROBNP in the last 8760 hours. HbA1C: No results for input(s): HGBA1C in the last 72 hours. CBG: Recent Labs  Lab 07/01/18 2022 07/02/18 0028 07/02/18 0408 07/02/18 0806 07/02/18 1147  GLUCAP 90 173* 76 85 143*   Lipid Profile: No results for input(s): CHOL, HDL, LDLCALC, TRIG, CHOLHDL, LDLDIRECT in the last 72 hours. Thyroid Function Tests: No results for input(s): TSH, T4TOTAL, FREET4, T3FREE, THYROIDAB in the last 72 hours. Anemia Panel: No results for input(s): VITAMINB12, FOLATE, FERRITIN, TIBC, IRON, RETICCTPCT in the last 72 hours. Sepsis Labs: No results for input(s): PROCALCITON, LATICACIDVEN in the last 168 hours.  Recent Results (from the past 240 hour(s))  Surgical pcr screen     Status: None   Collection Time: 06/29/18  2:25 AM  Result Value Ref Range Status   MRSA, PCR NEGATIVE NEGATIVE Final   Staphylococcus aureus NEGATIVE NEGATIVE Final    Comment: (NOTE) The Xpert SA Assay (FDA approved for NASAL specimens in patients 70 years of age and older), is one component of a comprehensive surveillance program. It is not intended to diagnose infection nor to guide or monitor treatment. Performed at [facility], [place] [address] [place], [place] [address]          Radiology Studies: No results found.      Scheduled Meds: . ALPRAZolam  0.5 mg Oral TID  . amitriptyline  75 mg Oral QHS  . apixaban  2.5 mg Oral BID  . docusate sodium  100 mg Oral BID  . furosemide  20 mg Oral Daily  . gemfibrozil  600 mg Oral BID AC  .  insulin aspart  0-9 Units Subcutaneous Q4H  . insulin glargine  35 Units Subcutaneous QHS  . nicotine  14 mg Transdermal Daily  . traMADol  50 mg Oral Q6H   Continuous Infusions:   LOS: 3 days    Time spent: More than 50% of that time was spent in counseling and/or coordination of care.      Shelly Coss, MD [facility] Pager [phone]  If 7PM-7AM, please contact night-coverage [email or web address] Password [facility] 07/02/2018, 2:36 PM

## 2018-07-02 NOTE — Care Management Important Message (Signed)
Important Message  Patient Details  Name: Sandra Ferrell MRN: [id number] Date of Birth: [birth year]   Medicare Important Message Given:  Yes    Wesam Gearhart Montine Circle 07/02/2018, 1:57 PM

## 2018-07-02 NOTE — Plan of Care (Signed)
  Problem: Education: Goal: Knowledge of General Education information will improve Description: Including pain rating scale, medication(s)/side effects and non-pharmacologic comfort measures Outcome: Progressing   Problem: Activity: Goal: Risk for activity intolerance will decrease Outcome: Progressing   Problem: Pain Managment: Goal: General experience of comfort will improve Outcome: Progressing   

## 2018-07-03 ENCOUNTER — Ambulatory Visit: Payer: Medicare Other

## 2018-07-03 DIAGNOSIS — S72402A Unspecified fracture of lower end of left femur, initial encounter for closed fracture: Secondary | ICD-10-CM | POA: Diagnosis not present

## 2018-07-03 DIAGNOSIS — S79929A Unspecified injury of unspecified thigh, initial encounter: Secondary | ICD-10-CM | POA: Diagnosis not present

## 2018-07-03 DIAGNOSIS — J449 Chronic obstructive pulmonary disease, unspecified: Secondary | ICD-10-CM | POA: Diagnosis not present

## 2018-07-03 DIAGNOSIS — R000 Tachycardia, unspecified: Secondary | ICD-10-CM | POA: Diagnosis not present

## 2018-07-03 DIAGNOSIS — I509 Heart failure, unspecified: Secondary | ICD-10-CM | POA: Diagnosis not present

## 2018-07-03 DIAGNOSIS — G894 Chronic pain syndrome: Secondary | ICD-10-CM | POA: Diagnosis not present

## 2018-07-03 DIAGNOSIS — E876 Hypokalemia: Secondary | ICD-10-CM | POA: Diagnosis not present

## 2018-07-03 DIAGNOSIS — E162 Hypoglycemia, unspecified: Secondary | ICD-10-CM | POA: Diagnosis not present

## 2018-07-03 DIAGNOSIS — I499 Cardiac arrhythmia, unspecified: Secondary | ICD-10-CM | POA: Diagnosis not present

## 2018-07-03 DIAGNOSIS — T8484XA Pain due to internal orthopedic prosthetic devices, implants and grafts, initial encounter: Secondary | ICD-10-CM | POA: Diagnosis not present

## 2018-07-03 DIAGNOSIS — M2550 Pain in unspecified joint: Secondary | ICD-10-CM | POA: Diagnosis not present

## 2018-07-03 DIAGNOSIS — R350 Frequency of micturition: Secondary | ICD-10-CM | POA: Diagnosis not present

## 2018-07-03 DIAGNOSIS — R2689 Other abnormalities of gait and mobility: Secondary | ICD-10-CM | POA: Diagnosis not present

## 2018-07-03 DIAGNOSIS — S72352S Displaced comminuted fracture of shaft of left femur, sequela: Secondary | ICD-10-CM | POA: Diagnosis not present

## 2018-07-03 DIAGNOSIS — W19XXXD Unspecified fall, subsequent encounter: Secondary | ICD-10-CM | POA: Diagnosis not present

## 2018-07-03 DIAGNOSIS — R300 Dysuria: Secondary | ICD-10-CM | POA: Diagnosis not present

## 2018-07-03 DIAGNOSIS — J45909 Unspecified asthma, uncomplicated: Secondary | ICD-10-CM | POA: Diagnosis not present

## 2018-07-03 DIAGNOSIS — W19XXXA Unspecified fall, initial encounter: Secondary | ICD-10-CM | POA: Diagnosis not present

## 2018-07-03 DIAGNOSIS — D649 Anemia, unspecified: Secondary | ICD-10-CM | POA: Diagnosis not present

## 2018-07-03 DIAGNOSIS — S72142D Displaced intertrochanteric fracture of left femur, subsequent encounter for closed fracture with routine healing: Secondary | ICD-10-CM | POA: Diagnosis not present

## 2018-07-03 DIAGNOSIS — S72492S Other fracture of lower end of left femur, sequela: Secondary | ICD-10-CM | POA: Diagnosis not present

## 2018-07-03 DIAGNOSIS — Z6841 Body Mass Index (BMI) 40.0 and over, adult: Secondary | ICD-10-CM | POA: Diagnosis not present

## 2018-07-03 DIAGNOSIS — E785 Hyperlipidemia, unspecified: Secondary | ICD-10-CM | POA: Diagnosis not present

## 2018-07-03 DIAGNOSIS — S72352D Displaced comminuted fracture of shaft of left femur, subsequent encounter for closed fracture with routine healing: Secondary | ICD-10-CM | POA: Diagnosis not present

## 2018-07-03 DIAGNOSIS — N390 Urinary tract infection, site not specified: Secondary | ICD-10-CM | POA: Diagnosis not present

## 2018-07-03 DIAGNOSIS — L0390 Cellulitis, unspecified: Secondary | ICD-10-CM | POA: Diagnosis not present

## 2018-07-03 DIAGNOSIS — F17210 Nicotine dependence, cigarettes, uncomplicated: Secondary | ICD-10-CM | POA: Diagnosis not present

## 2018-07-03 DIAGNOSIS — Z7401 Bed confinement status: Secondary | ICD-10-CM | POA: Diagnosis not present

## 2018-07-03 DIAGNOSIS — M79605 Pain in left leg: Secondary | ICD-10-CM | POA: Diagnosis not present

## 2018-07-03 DIAGNOSIS — Y831 Surgical operation with implant of artificial internal device as the cause of abnormal reaction of the patient, or of later complication, without mention of misadventure at the time of the procedure: Secondary | ICD-10-CM | POA: Diagnosis not present

## 2018-07-03 DIAGNOSIS — E119 Type 2 diabetes mellitus without complications: Secondary | ICD-10-CM | POA: Diagnosis not present

## 2018-07-03 DIAGNOSIS — K588 Other irritable bowel syndrome: Secondary | ICD-10-CM | POA: Diagnosis not present

## 2018-07-03 DIAGNOSIS — M6281 Muscle weakness (generalized): Secondary | ICD-10-CM | POA: Diagnosis not present

## 2018-07-03 DIAGNOSIS — E161 Other hypoglycemia: Secondary | ICD-10-CM | POA: Diagnosis not present

## 2018-07-03 DIAGNOSIS — S72492A Other fracture of lower end of left femur, initial encounter for closed fracture: Secondary | ICD-10-CM | POA: Diagnosis not present

## 2018-07-03 DIAGNOSIS — I10 Essential (primary) hypertension: Secondary | ICD-10-CM | POA: Diagnosis not present

## 2018-07-03 DIAGNOSIS — Z9181 History of falling: Secondary | ICD-10-CM | POA: Diagnosis not present

## 2018-07-03 DIAGNOSIS — M545 Low back pain: Secondary | ICD-10-CM | POA: Diagnosis not present

## 2018-07-03 DIAGNOSIS — K219 Gastro-esophageal reflux disease without esophagitis: Secondary | ICD-10-CM | POA: Diagnosis not present

## 2018-07-03 LAB — GLUCOSE, CAPILLARY
GLUCOSE-CAPILLARY: 151 mg/dL — AB (ref 70–99)
Glucose-Capillary: 115 mg/dL — ABNORMAL HIGH (ref 70–99)

## 2018-07-03 MED ORDER — POLYETHYLENE GLYCOL 3350 17 G PO PACK: 17.0000 g | PACK | Freq: Every day | ORAL | 0 refills | Status: DC

## 2018-07-03 MED ORDER — POLYETHYLENE GLYCOL 3350 17 G PO PACK
17.0000 g | PACK | Freq: Every day | ORAL | Status: DC
  Administered 2018-07-03: 17 g via ORAL
  Filled 2018-07-03: qty 1

## 2018-07-03 MED ORDER — HYDROCODONE-ACETAMINOPHEN 5-325 MG PO TABS: 1.0000 | ORAL_TABLET | ORAL | 0 refills | Status: DC | PRN

## 2018-07-03 MED ORDER — APIXABAN 2.5 MG PO TABS: 2.5000 mg | ORAL_TABLET | Freq: Two times a day (BID) | ORAL | 0 refills | Status: DC

## 2018-07-03 MED ORDER — AMITRIPTYLINE HCL 75 MG PO TABS: 75.0000 mg | ORAL_TABLET | Freq: Every day | ORAL | 0 refills | Status: DC

## 2018-07-03 MED ORDER — ALPRAZOLAM 0.5 MG PO TABS: 0.5000 mg | ORAL_TABLET | Freq: Three times a day (TID) | ORAL | 0 refills | Status: DC

## 2018-07-03 NOTE — Progress Notes (Signed)
4 attempts made to call report with no phone being answered at receiving facility

## 2018-07-03 NOTE — Progress Notes (Signed)
Pt will DC to: [place] date: 07/03/2018 Family notified: Jeneen Rinks- spouse Transport JE:ADGN  RN, patient, and facility notified of DC. Discharge Summary sent to facility. RN given number for [phone] room 507. DC packet on chart. Ambulance transport requested for patient.   CSW signing off. Thurmond Butts, [place] Social Worker [phone]

## 2018-07-03 NOTE — Progress Notes (Signed)
Attempted to call report to snf at this time.  Phone was not answered when transferred to receiving unit.

## 2018-07-03 NOTE — Consult Note (Signed)
            [facility] CM Primary Care Navigator  07/03/2018  [name] 1961-01-09 [id number]   Went to see patient in the room to identify possible discharge needs but she was being discharged. Was able to talk to patient's husband Sandra Ferrell) who provided information on patient.  Husband reports that patient slipped and fell at home that resulted to this admission/ surgery. Per MD note, patient was found to have displaced comminuted left distal femur fracture, status post open reduction and internal fixation for the distal femur fracture.  Patient's husbandendorsesDr.Amy Bedsole with [employer] at [facility] primary care provider.   Patientis using [place] on [employer] medications without difficulty.  Patient has beenmanaging hermedications at home straight out of the containers.  Her husband hasbeen providingtransportationtoherdoctors' appointments.  Patient's husband will be the primary caregiver for her at home.   Discharge plan isskilled nursing facility (S[facility]- [facility])per therapy recommendation.  Patient's husbandvoiced understandingto callprimarycare provider's office when patientreturns backhome,for a post discharge follow-upvisitwithin1- 2 weeksor sooner if needs arise.Patient letter (with PCP's contact number) was provided asareminder.   Explained topatient's husbandregarding [facility] CM services available for health management and had indicated interest for it. Encouraged to discuss with primary care provider onpatient's next visit about further needs in managingherhealthconditions-when shegets back home.   Patient's husbandverbalizedunderstandingto seekreferral from primary care provider to [facility] care management ifdeemed necessary and appropriatefor furtherservicesin the future-onceshe is discharge home.   [facility] care management information was provided for futureneeds  thatpatientmay have.  Primary care provider's office is listed as providing transition of care (TOC).   For additional questions please contact:  Edwena Felty A. Bridgitte Felicetti, BSN, RN-BC [facility] PRIMARY CARE Navigator Cell: [phone]

## 2018-07-03 NOTE — Progress Notes (Signed)
Tried again to call report to  [facility].  Was given a nurses cell phone number to call.  No answer on the cell phone.  Called back to facility and was transferred to receiving unit where there again was no answer

## 2018-07-03 NOTE — Clinical Social Work Placement (Signed)
   CLINICAL SOCIAL WORK PLACEMENT  NOTE  Date:  07/03/2018  Patient Details  Name: Sandra Ferrell MRN: [id number] Date of Birth: 02-27-1961  Clinical Social Work is seeking post-discharge placement for this patient at the [place] level of care (*CSW will initial, date and re-position this form in  chart as items are completed):  Yes   Patient/family provided with [place] Work Department's list of facilities offering this level of care within the geographic area requested by the patient (or if unable, by the patient's family).  Yes   Patient/family informed of their freedom to choose among providers that offer the needed level of care, that participate in Medicare, Medicaid or managed care program needed by the patient, have an available bed and are willing to accept the patient.  Yes   Patient/family informed of [facility]'s ownership interest in [facility] and [facility], as well as of the fact that they are under no obligation to receive care at these facilities.  PASRR submitted to EDS on 07/01/18     PASRR number received on 07/01/18     Existing PASRR number confirmed on       FL2 transmitted to all facilities in geographic area requested by pt/family on 07/01/18     FL2 transmitted to all facilities within larger geographic area on       Patient informed that his/her managed care company has contracts with or will negotiate with certain facilities, including the following:            Patient/family informed of bed offers received.  Patient chooses bed at [facility]     Physician recommends and patient chooses bed at      Patient to be transferred to [facility] on 07/03/18.  Patient to be transferred to facility by [facility]     Patient family notified on 07/03/18 of transfer.  Name of family member notified:  James/ Spouse     PHYSICIAN       Additional Comment:     _______________________________________________ Vinie Sill, [place] 07/03/2018, 1:01 PM

## 2018-07-03 NOTE — Plan of Care (Signed)
  Problem: Education: Goal: Knowledge of General Education information will improve Description Including pain rating scale, medication(s)/side effects and non-pharmacologic comfort measures Outcome: Adequate for Discharge   Problem: Health Behavior/Discharge Planning: Goal: Ability to manage health-related needs will improve Outcome: Adequate for Discharge   Problem: Clinical Measurements: Goal: Ability to maintain clinical measurements within normal limits will improve Outcome: Adequate for Discharge Goal: Will remain free from infection Outcome: Adequate for Discharge Goal: Diagnostic test results will improve Outcome: Adequate for Discharge Goal: Respiratory complications will improve Outcome: Adequate for Discharge Goal: Cardiovascular complication will be avoided Outcome: Adequate for Discharge   Problem: Activity: Goal: Risk for activity intolerance will decrease Outcome: Adequate for Discharge   Problem: Nutrition: Goal: Adequate nutrition will be maintained Outcome: Adequate for Discharge   Problem: Coping: Goal: Level of anxiety will decrease Outcome: Adequate for Discharge   Problem: Elimination: Goal: Will not experience complications related to bowel motility Outcome: Adequate for Discharge Goal: Will not experience complications related to urinary retention Outcome: Adequate for Discharge   Problem: Pain Managment: Goal: General experience of comfort will improve Outcome: Adequate for Discharge   Problem: Safety: Goal: Ability to remain free from injury will improve Outcome: Adequate for Discharge   Problem: Pain Management: Goal: Pain level will decrease Outcome: Adequate for Discharge   Problem: Self-Concept: Goal: Ability to maintain and perform role responsibilities to the fullest extent possible will improve Outcome: Adequate for Discharge   Problem: Clinical Measurements: Goal: Postoperative complications will be avoided or  minimized Outcome: Adequate for Discharge   Problem: Activity: Goal: Ability to ambulate and perform ADLs will improve Outcome: Adequate for Discharge   Problem: Education: Goal: Verbalization of understanding the information provided (i.e., activity precautions, restrictions, etc) will improve Outcome: Adequate for Discharge Goal: Individualized Educational Video(s) Outcome: Adequate for Discharge

## 2018-07-03 NOTE — Plan of Care (Signed)
  Problem: Pain Managment: Goal: General experience of comfort will improve Outcome: Progressing   

## 2018-07-03 NOTE — Discharge Summary (Signed)
Physician Discharge Summary  Sandra Ferrell GQQ:[id number] DOB: Feb 16, 1961 DOA: 06/28/2018  PCP: Jinny Sanders, MD  Admit date: 06/28/2018 Discharge date: 07/03/2018  Admitted From: Home Disposition:  SNF   Discharge Condition:Stable CODE STATUS:FULL Diet recommendation: Heart Healthy  Brief/Interim Summary:  Patient is a 57 year old female with past medical history of chronic pain syndrome, type 2 diabetes mellitus on insulin, asthma, COPD, chronic diastolic CHF was admitted with a mechanical fall and was found to have displaced comminuted left distal femur fracture.  She is status post open reduction and internal fixation for the distal femur fracture.  Patient is stable for discharge to skilled nursing facility today.   Following problems were addressed during hospitalization:  Left femur fracture: Status post ORIF on 06/29/2018.  Continue pain management.  Continue Eliquis for DVT prophylaxis for ttoal of 14 days as orthopedics  .  PT recommending skilled nursing facility placement.  Social worker on board.  Asthma/COPD: Has chronic wheezes.  Continue PRN bronchodilators.  Not on home oxygen.  Diabetes mellitus: On insulin at home along with other anti-hyperglycemic agents at home which we will continue.    Hyperlipidemia: Continue gemfibrozil  Chronic diastolic heart failure: Continue Lasix 20 mg daily.  Constipation: Continue MiraLAX.  Abdominal discomfort: May be secondary to constipation.  Continue MiraLAX and dicyclomine.   Discharge Diagnoses:  Principal Problem:   Closed femur fracture ([place]) Active Problems:   Diabetes mellitus with no complication (HCC)   Essential hypertension, benign   Anemia   Femur fracture, left [facility])    Discharge Instructions  Discharge Instructions    Diet - low sodium heart healthy   Complete by:  As directed    Discharge instructions   Complete by:  As directed    1) Follow up with orthopedics as an outpatient in 2 weeks.  Name and number of the provider has been atatched. 2)Take prescribed medications as instructed.   Increase activity slowly   Complete by:  As directed    Touch down weight bearing   Complete by:  As directed    Laterality:  left   Extremity:  Lower     Allergies as of 07/03/2018      Reactions   Benazepril Anaphylaxis, Swelling, Other (See Comments)   Angioedema, throat swelling   Diclofenac Sodium Other (See Comments)   GI bleed   Fluticasone-salmeterol Hives, Itching, Swelling   Tongue was swollen   Nsaids Other (See Comments)   Rectal bleeding   Penicillins Hives, Rash   Has patient had a PCN reaction causing immediate rash, facial/tongue/throat swelling, SOB or lightheadedness with hypotension: no Has patient had a PCN reaction causing severe rash involving mucus membranes or skin necrosis: no Has patient had a PCN reaction that required hospitalization no Has patient had a PCN reaction occurring within the last 10 years: no If all of the above answers are "NO", then may proceed with Cephalosporin use.   Aspirin Other (See Comments)   Burns stomach   Propoxyphene Other (See Comments)   Darvocet - Headache      Medication List    TAKE these medications   ACCU-CHEK AVIVA PLUS test strip Generic drug:  glucose blood TEST BLOOD SUGAR TWICE DAILY AND DIRECTED   ACCU-CHEK AVIVA PLUS w/Device Kit Check blood sugar twice daily and as directed.   ACCU-CHEK SOFTCLIX LANCETS lancets CHECK BLOOD SUGAR TWICE DAILY AND AS DIRECTED   Adjustable Lancing Device Misc Check blood sugar twice a day and as directed. Dx E11.9  AIMSCO INSULIN SYR ULTRA THIN 31G X 5/16" 0.3 ML Misc Generic drug:  Insulin Syringe-Needle U-100   Alcohol Swabs Pads Check blood sugar twice a day and as directed. Dx E11.9   ALPRAZolam 0.5 MG tablet Commonly known as:  XANAX Take 1 tablet (0.5 mg total) by mouth 3 (three) times daily.   amitriptyline 75 MG tablet Commonly known as:  ELAVIL Take 1  tablet (75 mg total) by mouth at bedtime.   apixaban 2.5 MG Tabs tablet Commonly known as:  ELIQUIS Take 1 tablet (2.5 mg total) by mouth 2 (two) times daily for 12 days.   B-D ULTRAFINE III SHORT PEN 31G X 8 MM Misc Generic drug:  Insulin Pen Needle USE AS DIRECTED WITH INSULIN PEN What changed:    when to take this  additional instructions   budesonide-formoterol 160-4.5 MCG/ACT inhaler Commonly known as:  SYMBICORT Inhale 2 puffs into the lungs 2 (two) times daily.   dicyclomine 20 MG tablet Commonly known as:  BENTYL Take 1 tablet (20 mg total) 2 (two) times daily by mouth.   furosemide 20 MG tablet Commonly known as:  LASIX Take 1 tablet (20 mg total) by mouth daily.   gabapentin 300 MG capsule Commonly known as:  NEURONTIN TAKE 1 CAPSULE BY MOUTH IN THE MORNING, 1 CAPSULE IN THE AFTERNOON AND 2 CAPSULES AT BEDTIME What changed:  See the new instructions.   gemfibrozil 600 MG tablet Commonly known as:  LOPID TAKE 1 TABLET BY MOUTH TWICE DAILY What changed:  when to take this   HYDROcodone-acetaminophen 5-325 MG tablet Commonly known as:  NORCO/VICODIN Take 1-2 tablets by mouth every 4 (four) hours as needed for moderate pain (pain score 4-6).   Insulin Glargine 100 UNIT/ML Solostar Pen Commonly known as:  LANTUS ADMINISTER 35 UNITS UNDER THE SKIN AT BEDTIME What changed:    how much to take  how to take this  when to take this  additional instructions   metFORMIN 1000 MG tablet Commonly known as:  GLUCOPHAGE TAKE 1 TABLET BY MOUTH TWICE DAILY WITH MEALS   methocarbamol 500 MG tablet Commonly known as:  ROBAXIN TAKE 2 TABLETS BY MOUTH AT BEDTIME   nicotine 14 mg/24hr patch Commonly known as:  NICODERM CQ - dosed in mg/24 hours Place 1 patch (14 mg total) onto the skin daily.   pioglitazone 45 MG tablet Commonly known as:  ACTOS TAKE 1 TABLET BY MOUTH EVERY DAY   polyethylene glycol packet Commonly known as:  MIRALAX / GLYCOLAX Take 17 g by  mouth daily.   PROAIR HFA 108 (90 Base) MCG/ACT inhaler Generic drug:  albuterol TAKE 2 PUFFS BY MOUTH EVERY 4 HOURS AS NEEDED FOR WHEEZE OR FOR SHORTNESS OF BREATH What changed:  See the new instructions.   ranitidine 150 MG tablet Commonly known as:  ZANTAC Take 150 mg by mouth 2 (two) times daily.            Durable Medical Equipment  (From admission, onward)         Start     Ordered   06/29/18 1427  DME Walker rolling  Once    Question:  Patient needs a walker to treat with the following condition  Answer:  Femur fracture, left ([place])   06/29/18 1426   06/29/18 1427  DME 3 n 1  Once     06/29/18 1426   06/29/18 1427  DME Bedside commode  Once    Question:  Patient needs a bedside  commode to treat with the following condition  Answer:  Femur fracture, left [facility])   06/29/18 1426           Discharge Care Instructions  (From admission, onward)         Start     Ordered   06/29/18 0000  Touch down weight bearing    Question Answer Comment  Laterality left   Extremity Lower      06/29/18 1256         Follow-up Information    Milly Jakob, MD In 2 weeks.   Specialty:  Orthopedic Surgery Why:  following surgery Contact information: [address] [place] [address] [phone]          Allergies  Allergen Reactions  . Benazepril Anaphylaxis, Swelling and Other (See Comments)    Angioedema, throat swelling  . Diclofenac Sodium Other (See Comments)    GI bleed  . Fluticasone-Salmeterol Hives, Itching and Swelling    Tongue was swollen  . Nsaids Other (See Comments)    Rectal bleeding  . Penicillins Hives and Rash    Has patient had a PCN reaction causing immediate rash, facial/tongue/throat swelling, SOB or lightheadedness with hypotension: no Has patient had a PCN reaction causing severe rash involving mucus membranes or skin necrosis: no Has patient had a PCN reaction that required hospitalization no Has patient had a PCN reaction  occurring within the last 10 years: no If all of the above answers are "NO", then may proceed with Cephalosporin use.   . Aspirin Other (See Comments)    Burns stomach  . Propoxyphene Other (See Comments)    Darvocet - Headache    Consultations:  Ortho   Procedures/Studies: Dg Chest 1 View  Result Date: 06/28/2018 CLINICAL DATA:  Preop chest x-ray.  Femur fracture. EXAM: CHEST  1 VIEW COMPARISON:  Chest x-ray dated 05/20/2017. FINDINGS: The heart size is upper normal. Both lungs are clear. The visualized skeletal structures are unremarkable. IMPRESSION: No active disease. Electronically Signed   By: Franki Cabot M.D.   On: 06/28/2018 22:09   Dg Knee Complete 4 Views Left  Result Date: 06/28/2018 CLINICAL DATA:  Slip on water at home leading to fall. Left hip and knee pain. EXAM: LEFT KNEE - COMPLETE 4+ VIEW COMPARISON:  None. FINDINGS: Comminuted and significantly displaced distal femur fracture with 17 mm posterior displacement of distal fracture fragment and 4.5 cm osseous overriding. No evidence of intra-articular extension or joint effusion. Degenerative change of the knee joint. There is patellar [place]. IMPRESSION: Significantly displaced and comminuted distal femoral shaft fracture, 4.5 cm osseous overriding. No evidence of intra-articular extension. Electronically Signed   By: Jeb Levering M.D.   On: 06/28/2018 22:15   Dg C-arm 1-60 Min  Result Date: 06/29/2018 CLINICAL DATA:  ORIF distal femur fracture EXAM: DG C-ARM 61-120 MIN; LEFT FEMUR 2 VIEWS COMPARISON:  06/28/2018 left femur radiographs FINDINGS: Fluoroscopy time 3 minutes 16 seconds. Nondiagnostic spot fluoroscopic intraoperative radiographs of the left femur demonstrate transfixation of the comminuted distal left femoral fracture with intramedullary rod with proximal and distal interlocking screws. IMPRESSION: Intraoperative fluoroscopic guidance for ORIF distal left femur fracture. Electronically Signed   By: Ilona Sorrel M.D.   On: 06/29/2018 13:38   Dg Hip Unilat W Or Wo Pelvis 2-3 Views Left  Result Date: 06/28/2018 CLINICAL DATA:  Slip on water at home leading to fall. Left hip and knee pain. EXAM: DG HIP (WITH OR WITHOUT PELVIS) 2-3V LEFT COMPARISON:  Radiograph 04/06/2012 FINDINGS: The cortical margins of the bony pelvis and left hip are intact. No fracture. Pubic symphysis and sacroiliac joints are congruent. Mild left acetabular spurring. Both femoral heads are well-seated in the respective acetabula. Left distal femur fracture partially included in the field of view. IMPRESSION: No acute fracture of the pelvis or left hip. Electronically Signed   By: Jeb Levering M.D.   On: 06/28/2018 22:07   Dg Femur Min 2 Views Left  Result Date: 06/30/2018 CLINICAL DATA:  Status post ORIF of left femur fracture. EXAM: LEFT FEMUR 2 VIEWS COMPARISON:  Left hip and knee radiographs 06/28/2018. Intraoperative fluoroscopic images [phone]. FINDINGS: There has been ORIF of the previously described comminuted distal femoral shaft fracture. A retrograde intramedullary nail is present with multiple proximal and distal interlocking screws. Alignment of the main proximal and distal fragments is nearly anatomic. Comminuted fragments demonstrate mild displacement. The left hip and left knee are grossly located. A large left knee joint effusion is present. Soft tissue gas and skin staples are noted. IMPRESSION: ORIF of distal left femoral shaft fracture as above. Electronically Signed   By: Logan Bores M.D.   On: 06/30/2018 10:41   Dg Femur Min 2 Views Left  Result Date: 06/29/2018 CLINICAL DATA:  ORIF distal femur fracture EXAM: DG C-ARM 61-120 MIN; LEFT FEMUR 2 VIEWS COMPARISON:  06/28/2018 left femur radiographs FINDINGS: Fluoroscopy time 3 minutes 16 seconds. Nondiagnostic spot fluoroscopic intraoperative radiographs of the left femur demonstrate transfixation of the comminuted distal left femoral fracture with intramedullary rod  with proximal and distal interlocking screws. IMPRESSION: Intraoperative fluoroscopic guidance for ORIF distal left femur fracture. Electronically Signed   By: Ilona Sorrel M.D.   On: 06/29/2018 13:38       Subjective: Patient seen and examined the bedside this morning.  Complains of some mild abdominal discomfort.  Did not have a bowel movement since last Saturday.  She is hemodynamically stable otherwise.  Stable for discharge to skilled nursing facility today.  Discharge Exam: Vitals:   07/03/18 0454 07/03/18 0514  BP: 129/86 (!) 131/57  Pulse: 88 96  Resp: 16 16  Temp: 97.9 F (36.6 C) 98.4 F (36.9 C)  SpO2: 100% 98%   Vitals:   07/02/18 2035 07/03/18 0454 07/03/18 0500 07/03/18 0514  BP: (!) 139/59 129/86  (!) 131/57  Pulse: (!) 104 88  96  Resp: _0 Temp: 98.6 F (37 C) 97.9 F (36.6 C)  98.4 F (36.9 C)  TempSrc: Oral Oral  Oral  SpO2: 100% 100%  98%  Weight:   107.7 kg   Height:        General: Pt is alert, awake, not in acute distress Cardiovascular: RRR, S1/S2 +, no rubs, no gallops Respiratory: CTA bilaterally, no wheezing, no rhonchi Abdominal: Soft, NT, ND, bowel sounds + Extremities: no edema, no cyanosis    The results of significant diagnostics from this hospitalization (including imaging, microbiology, ancillary and laboratory) are listed below for reference.     Microbiology: Recent Results (from the past 240 hour(s))  Surgical pcr screen     Status: None   Collection Time: 06/29/18  2:25 AM  Result Value Ref Range Status   MRSA, PCR NEGATIVE NEGATIVE Final   Staphylococcus aureus NEGATIVE NEGATIVE Final    Comment: (NOTE) The Xpert SA Assay (FDA approved for NASAL specimens in patients 65 years of age and older), is one component of a comprehensive surveillance program. It is not intended to  diagnose infection nor to guide or monitor treatment. Performed at [facility], [place] [address], [place], [place] [address]       Labs: BNP (last 3 results) No results for input(s): BNP in the last 8760 hours. Basic Metabolic Panel: Recent Labs  Lab 06/28/18 2202 06/29/18 0512 06/30/18 0342  NA 140 140 138  K 4.2 3.7 3.5  CL 104 107 106  CO2 _0 GLUCOSE 154* 134* 117*  BUN _1 CREATININE 0.92 0.71 0.65  CALCIUM 8.4* 8.2* 8.1*   Liver Function Tests: Recent Labs  Lab 06/29/18 0512 06/30/18 0342  AST 22 20  ALT 15 16  ALKPHOS 70 57  BILITOT 0.4 0.5  PROT 6.2* 5.5*  ALBUMIN 3.0* 2.6*   No results for input(s): LIPASE, AMYLASE in the last 168 hours. No results for input(s): AMMONIA in the last 168 hours. CBC: Recent Labs  Lab 06/28/18 2202 06/29/18 0512 06/30/18 0342 07/01/18 0620 07/02/18 0408  WBC 16.1* 13.1* 9.8 10.4 8.5  NEUTROABS 13.1*  --   --   --   --   HGB 11.5* 11.4* 9.5* 9.4* 9.1*  HCT 36.6 36.3 30.4* 30.2* 28.3*  MCV 96.6 95.3 96.2 95.3 93.4  PLT 348 357 286 277 275   Cardiac Enzymes: No results for input(s): CKTOTAL, CKMB, CKMBINDEX, TROPONINI in the last 168 hours. BNP: Invalid input(s): POCBNP CBG: Recent Labs  Lab 07/02/18 0806 07/02/18 1147 07/02/18 1633 07/02/18 2231 07/03/18 0733  GLUCAP 85 143* 149* 156* 115*   D-Dimer No results for input(s): DDIMER in the last 72 hours. Hgb A1c No results for input(s): HGBA1C in the last 72 hours. Lipid Profile No results for input(s): CHOL, HDL, LDLCALC, TRIG, CHOLHDL, LDLDIRECT in the last 72 hours. Thyroid function studies No results for input(s): TSH, T4TOTAL, T3FREE, THYROIDAB in the last 72 hours.  Invalid input(s): FREET3 Anemia work up No results for input(s): VITAMINB12, FOLATE, FERRITIN, TIBC, IRON, RETICCTPCT in the last 72 hours. Urinalysis    Component Value Date/Time   COLORURINE YELLOW 09/24/2017 [place] 09/24/2017 0452   LABSPEC 1.012 09/24/2017 0452   PHURINE 6.0 09/24/2017 0452   GLUCOSEU NEGATIVE 09/24/2017 0452   GLUCOSEU NEGATIVE 12/26/2010 1044   HGBUR SMALL  (A) 09/24/2017 0452   HGBUR large 04/10/2010 1507   BILIRUBINUR NEGATIVE 09/24/2017 0452   BILIRUBINUR neg 12/12/2016 0804   KETONESUR 20 (A) 09/24/2017 0452   PROTEINUR NEGATIVE 09/24/2017 0452   UROBILINOGEN negative 12/12/2016 0804   UROBILINOGEN 0.2 01/12/2015 1037   NITRITE NEGATIVE 09/24/2017 0452   LEUKOCYTESUR NEGATIVE 09/24/2017 0452   Sepsis Labs Invalid input(s): PROCALCITONIN,  WBC,  LACTICIDVEN Microbiology Recent Results (from the past 240 hour(s))  Surgical pcr screen     Status: None   Collection Time: 06/29/18  2:25 AM  Result Value Ref Range Status   MRSA, PCR NEGATIVE NEGATIVE Final   Staphylococcus aureus NEGATIVE NEGATIVE Final    Comment: (NOTE) The Xpert SA Assay (FDA approved for NASAL specimens in patients 72 years of age and older), is one component of a comprehensive surveillance program. It is not intended to diagnose infection nor to guide or monitor treatment. Performed at [facility], [place] [address], [place], [place] [address]     Please note: You were cared for by a hospitalist during your hospital stay. Once you are discharged, your primary care physician will handle any further medical issues. Please note that NO REFILLS for any  discharge medications will be authorized once you are discharged, as it is imperative that you return to your primary care physician (or establish a relationship with a primary care physician if you do not have one) for your post hospital discharge needs so that they can reassess your need for medications and monitor your lab values.    Time coordinating discharge: 40 minutes  SIGNED:   Shelly Coss, MD  [facility] 07/03/2018, 9:04 AM Pager [phone]  If 7PM-7AM, please contact night-coverage [email or web address] Password [email or web address]

## 2018-07-04 DIAGNOSIS — S72352D Displaced comminuted fracture of shaft of left femur, subsequent encounter for closed fracture with routine healing: Secondary | ICD-10-CM | POA: Diagnosis not present

## 2018-07-04 DIAGNOSIS — E119 Type 2 diabetes mellitus without complications: Secondary | ICD-10-CM | POA: Diagnosis not present

## 2018-07-04 DIAGNOSIS — D649 Anemia, unspecified: Secondary | ICD-10-CM | POA: Diagnosis not present

## 2018-07-07 DIAGNOSIS — R350 Frequency of micturition: Secondary | ICD-10-CM | POA: Diagnosis not present

## 2018-07-07 DIAGNOSIS — S72352D Displaced comminuted fracture of shaft of left femur, subsequent encounter for closed fracture with routine healing: Secondary | ICD-10-CM | POA: Diagnosis not present

## 2018-07-07 DIAGNOSIS — E119 Type 2 diabetes mellitus without complications: Secondary | ICD-10-CM | POA: Diagnosis not present

## 2018-07-08 DIAGNOSIS — R2689 Other abnormalities of gait and mobility: Secondary | ICD-10-CM | POA: Diagnosis not present

## 2018-07-08 DIAGNOSIS — M79605 Pain in left leg: Secondary | ICD-10-CM | POA: Diagnosis not present

## 2018-07-08 DIAGNOSIS — G894 Chronic pain syndrome: Secondary | ICD-10-CM | POA: Diagnosis not present

## 2018-07-08 DIAGNOSIS — Z9181 History of falling: Secondary | ICD-10-CM | POA: Diagnosis not present

## 2018-07-09 DIAGNOSIS — N390 Urinary tract infection, site not specified: Secondary | ICD-10-CM | POA: Diagnosis not present

## 2018-07-09 DIAGNOSIS — S72352D Displaced comminuted fracture of shaft of left femur, subsequent encounter for closed fracture with routine healing: Secondary | ICD-10-CM | POA: Diagnosis not present

## 2018-07-09 DIAGNOSIS — D649 Anemia, unspecified: Secondary | ICD-10-CM | POA: Diagnosis not present

## 2018-07-09 DIAGNOSIS — R300 Dysuria: Secondary | ICD-10-CM | POA: Diagnosis not present

## 2018-07-10 DIAGNOSIS — R2689 Other abnormalities of gait and mobility: Secondary | ICD-10-CM | POA: Diagnosis not present

## 2018-07-10 DIAGNOSIS — M79605 Pain in left leg: Secondary | ICD-10-CM | POA: Diagnosis not present

## 2018-07-10 DIAGNOSIS — G894 Chronic pain syndrome: Secondary | ICD-10-CM | POA: Diagnosis not present

## 2018-07-10 DIAGNOSIS — Z9181 History of falling: Secondary | ICD-10-CM | POA: Diagnosis not present

## 2018-07-11 DIAGNOSIS — D649 Anemia, unspecified: Secondary | ICD-10-CM | POA: Diagnosis not present

## 2018-07-11 DIAGNOSIS — E876 Hypokalemia: Secondary | ICD-10-CM | POA: Diagnosis not present

## 2018-07-11 DIAGNOSIS — E119 Type 2 diabetes mellitus without complications: Secondary | ICD-10-CM | POA: Diagnosis not present

## 2018-07-15 DIAGNOSIS — S72492A Other fracture of lower end of left femur, initial encounter for closed fracture: Secondary | ICD-10-CM | POA: Diagnosis not present

## 2018-07-17 DIAGNOSIS — L0390 Cellulitis, unspecified: Secondary | ICD-10-CM | POA: Diagnosis not present

## 2018-07-17 DIAGNOSIS — E876 Hypokalemia: Secondary | ICD-10-CM | POA: Diagnosis not present

## 2018-07-17 DIAGNOSIS — S72352S Displaced comminuted fracture of shaft of left femur, sequela: Secondary | ICD-10-CM | POA: Diagnosis not present

## 2018-07-17 DIAGNOSIS — R000 Tachycardia, unspecified: Secondary | ICD-10-CM | POA: Diagnosis not present

## 2018-07-18 DIAGNOSIS — J449 Chronic obstructive pulmonary disease, unspecified: Secondary | ICD-10-CM | POA: Diagnosis not present

## 2018-07-18 DIAGNOSIS — E119 Type 2 diabetes mellitus without complications: Secondary | ICD-10-CM | POA: Diagnosis not present

## 2018-07-18 DIAGNOSIS — S72352S Displaced comminuted fracture of shaft of left femur, sequela: Secondary | ICD-10-CM | POA: Diagnosis not present

## 2018-07-21 DIAGNOSIS — M79605 Pain in left leg: Secondary | ICD-10-CM | POA: Diagnosis not present

## 2018-07-21 DIAGNOSIS — Z9181 History of falling: Secondary | ICD-10-CM | POA: Diagnosis not present

## 2018-07-21 DIAGNOSIS — R2689 Other abnormalities of gait and mobility: Secondary | ICD-10-CM | POA: Diagnosis not present

## 2018-07-21 DIAGNOSIS — G894 Chronic pain syndrome: Secondary | ICD-10-CM | POA: Diagnosis not present

## 2018-07-23 DIAGNOSIS — S72352D Displaced comminuted fracture of shaft of left femur, subsequent encounter for closed fracture with routine healing: Secondary | ICD-10-CM | POA: Diagnosis not present

## 2018-07-23 DIAGNOSIS — M79605 Pain in left leg: Secondary | ICD-10-CM | POA: Diagnosis not present

## 2018-07-23 DIAGNOSIS — R2689 Other abnormalities of gait and mobility: Secondary | ICD-10-CM | POA: Diagnosis not present

## 2018-07-23 DIAGNOSIS — M545 Low back pain: Secondary | ICD-10-CM | POA: Diagnosis not present

## 2018-07-23 DIAGNOSIS — G894 Chronic pain syndrome: Secondary | ICD-10-CM | POA: Diagnosis not present

## 2018-07-23 DIAGNOSIS — Z9181 History of falling: Secondary | ICD-10-CM | POA: Diagnosis not present

## 2018-07-23 DIAGNOSIS — L0390 Cellulitis, unspecified: Secondary | ICD-10-CM | POA: Diagnosis not present

## 2018-07-23 DIAGNOSIS — E119 Type 2 diabetes mellitus without complications: Secondary | ICD-10-CM | POA: Diagnosis not present

## 2018-07-23 DIAGNOSIS — R000 Tachycardia, unspecified: Secondary | ICD-10-CM | POA: Diagnosis not present

## 2018-07-24 ENCOUNTER — Other Ambulatory Visit: Payer: Self-pay | Admitting: Family Medicine

## 2018-07-24 NOTE — Telephone Encounter (Signed)
Last office visit 06/20/2018 for Sandra Ferrell.  Last refilled 05/23/2018 for #120 with 1 refill.  Ok to refill?

## 2018-07-25 ENCOUNTER — Ambulatory Visit: Payer: Medicare Other

## 2018-07-25 ENCOUNTER — Encounter

## 2018-07-28 DIAGNOSIS — R000 Tachycardia, unspecified: Secondary | ICD-10-CM | POA: Diagnosis not present

## 2018-07-29 DIAGNOSIS — G894 Chronic pain syndrome: Secondary | ICD-10-CM | POA: Diagnosis not present

## 2018-07-29 DIAGNOSIS — M79605 Pain in left leg: Secondary | ICD-10-CM | POA: Diagnosis not present

## 2018-07-29 DIAGNOSIS — Z9181 History of falling: Secondary | ICD-10-CM | POA: Diagnosis not present

## 2018-07-29 DIAGNOSIS — M545 Low back pain: Secondary | ICD-10-CM | POA: Diagnosis not present

## 2018-07-29 DIAGNOSIS — R2689 Other abnormalities of gait and mobility: Secondary | ICD-10-CM | POA: Diagnosis not present

## 2018-08-06 DIAGNOSIS — R2689 Other abnormalities of gait and mobility: Secondary | ICD-10-CM | POA: Diagnosis not present

## 2018-08-06 DIAGNOSIS — M79605 Pain in left leg: Secondary | ICD-10-CM | POA: Diagnosis not present

## 2018-08-06 DIAGNOSIS — L0390 Cellulitis, unspecified: Secondary | ICD-10-CM | POA: Diagnosis not present

## 2018-08-06 DIAGNOSIS — Z9181 History of falling: Secondary | ICD-10-CM | POA: Diagnosis not present

## 2018-08-06 DIAGNOSIS — G894 Chronic pain syndrome: Secondary | ICD-10-CM | POA: Diagnosis not present

## 2018-08-06 DIAGNOSIS — M545 Low back pain: Secondary | ICD-10-CM | POA: Diagnosis not present

## 2018-08-06 DIAGNOSIS — S72352D Displaced comminuted fracture of shaft of left femur, subsequent encounter for closed fracture with routine healing: Secondary | ICD-10-CM | POA: Diagnosis not present

## 2018-08-07 ENCOUNTER — Other Ambulatory Visit: Payer: Self-pay | Admitting: Family Medicine

## 2018-08-07 DIAGNOSIS — S72352D Displaced comminuted fracture of shaft of left femur, subsequent encounter for closed fracture with routine healing: Secondary | ICD-10-CM | POA: Diagnosis not present

## 2018-08-07 NOTE — Telephone Encounter (Signed)
Last office visit 06/20/2018 for [place]. Next Appt:  12/23/2018 for 6 month follow up.  Last refilled 06/17/2018 for #90 with no refills.  Ok to refill?

## 2018-08-08 DIAGNOSIS — Z9181 History of falling: Secondary | ICD-10-CM | POA: Diagnosis not present

## 2018-08-08 DIAGNOSIS — M79605 Pain in left leg: Secondary | ICD-10-CM | POA: Diagnosis not present

## 2018-08-08 DIAGNOSIS — R2689 Other abnormalities of gait and mobility: Secondary | ICD-10-CM | POA: Diagnosis not present

## 2018-08-08 DIAGNOSIS — M545 Low back pain: Secondary | ICD-10-CM | POA: Diagnosis not present

## 2018-08-08 DIAGNOSIS — G894 Chronic pain syndrome: Secondary | ICD-10-CM | POA: Diagnosis not present

## 2018-08-12 DIAGNOSIS — R2689 Other abnormalities of gait and mobility: Secondary | ICD-10-CM | POA: Diagnosis not present

## 2018-08-12 DIAGNOSIS — G894 Chronic pain syndrome: Secondary | ICD-10-CM | POA: Diagnosis not present

## 2018-08-12 DIAGNOSIS — M79605 Pain in left leg: Secondary | ICD-10-CM | POA: Diagnosis not present

## 2018-08-12 DIAGNOSIS — Z9181 History of falling: Secondary | ICD-10-CM | POA: Diagnosis not present

## 2018-08-12 DIAGNOSIS — M545 Low back pain: Secondary | ICD-10-CM | POA: Diagnosis not present

## 2018-08-14 ENCOUNTER — Other Ambulatory Visit: Payer: Self-pay | Admitting: Orthopedic Surgery

## 2018-08-14 DIAGNOSIS — T8484XA Pain due to internal orthopedic prosthetic devices, implants and grafts, initial encounter: Secondary | ICD-10-CM | POA: Diagnosis not present

## 2018-08-14 DIAGNOSIS — S72492A Other fracture of lower end of left femur, initial encounter for closed fracture: Secondary | ICD-10-CM | POA: Diagnosis not present

## 2018-08-15 NOTE — Progress Notes (Signed)
On 08/14/2018 attempted x 4 to reach a nurse to request current Mar, etc on patient for upcoming surgery.  LVMM on [phone].   On 08/15/2018 called again and attempted to reach Nurse Supervisor at [phone] with no success.  Called [phone] and Nurse Supervisor on other end of phone stated they only work Mon-Wed.  Gave me [phone].  To call for the other Nurse Supervisor.  Voice mail not yet set up.

## 2018-08-18 ENCOUNTER — Encounter (HOSPITAL_COMMUNITY): Payer: Self-pay | Admitting: *Deleted

## 2018-08-18 DIAGNOSIS — M79605 Pain in left leg: Secondary | ICD-10-CM | POA: Diagnosis not present

## 2018-08-18 DIAGNOSIS — R2689 Other abnormalities of gait and mobility: Secondary | ICD-10-CM | POA: Diagnosis not present

## 2018-08-18 DIAGNOSIS — M545 Low back pain: Secondary | ICD-10-CM | POA: Diagnosis not present

## 2018-08-18 DIAGNOSIS — G894 Chronic pain syndrome: Secondary | ICD-10-CM | POA: Diagnosis not present

## 2018-08-18 DIAGNOSIS — Z9181 History of falling: Secondary | ICD-10-CM | POA: Diagnosis not present

## 2018-08-18 NOTE — Progress Notes (Signed)
Preop instructions for:   Sandra Ferrell                       Date of Birth   Sep 16, 1961                          Date of Procedure: 08/20/2018        Doctor: Milly Jakob  Time to arrive at [facility]: 0630 Report to: Admitting  Procedure: Hardware removal left knee  Any procedure time changes, MD office will notify you!   Do not eat or drink past midnight the night before your procedure.(To include any tube feedings-must be discontinued)     Take these morning medications only with sips of water.(or give through gastrostomy or feeding tube). Inhalers as usual and bring, Alprazolam, Dicyclomine, Gabapentin, Take 1/2 of Evening Dose of Lantus Insulin nite before surgery, Hydrocodone, Ranitidine , Clindamycin,   Note: No Insulin or Diabetic meds should be given or taken the morning of the procedure!   Facility contact: Isaias Cowman                  Phone: [place]:  Transportation contact phone#: Isaias Cowman - [phone]  Please send day of procedure:current med list and meds last taken that day, confirm nothing by mouth status from what time, Patient Demographic info( to include DNR status, problem list, allergies)   RN contact name/phone#:  Isaias Cowman                            and Fax [phone]  [place] card and picture ID Leave all jewelry and other valuables at place where living( no metal or rings to be worn) No contact lens Women-no make-up, no lotions,perfumes,powders   Any questions day of procedure,call [place]- [phone]    Sent from :[facility]                   Phone:[phone]                   Fax:[phone]  Sent by :RN

## 2018-08-19 NOTE — H&P (Signed)
Sandra Ferrell is an 57 y.o. female.   Chief Complaint: left  Knee pain HPI: sp ORIF L distal femur fx, with distal  Screw backing out, becoming more prominent on the lateral side  Past Medical History:  Diagnosis Date  . Allergic rhinitis, cause unspecified   . Anemia   . Anxiety state, unspecified   . Backache, unspecified   . Calculus of kidney   . Carpal tunnel syndrome, right   . CHF (congestive heart failure) ([place])    unspecified   . Chronic pain syndrome   . Constipation   . COPD (chronic obstructive pulmonary disease) ([place])   . Cough   . Depressive disorder, not elsewhere classified    managed with medications  . Difficulty in walking   . Displaced intertrochanteric fracture of left femur ([place])   . Esophageal reflux   . Extrinsic asthma with exacerbation   . Fibromyalgia   . Heart murmur   . Hyperlipidemia   . Hypertension   . Muscle weakness (generalized)   . Nicotine dependence, cigarettes, uncomplicated   . Osteoarthrosis, unspecified whether generalized or localized, unspecified site   . Other abnormalities of gait and mobility   . Other and unspecified hyperlipidemia   . Other irritable bowel syndrome   . Other screening mammogram   . Personal history of unspecified urinary disorder   . Pneumonia   . Pressure ulcer of sacral region   . Routine general medical examination at a health care facility   . Routine gynecological examination   . Tobacco use disorder   . Type II or unspecified type diabetes mellitus without mention of complication, not stated as uncontrolled   . Unspecified fall, subsequent encounter   . Unspecified sleep apnea   . Urinary tract infection   . Vaginitis   . Wears glasses   . Wheezing     Past Surgical History:  Procedure Laterality Date  . ANTERIOR CERVICAL DECOMP/DISCECTOMY FUSION N/A 01/19/2015   Procedure: ANTERIOR CERVICAL DECOMPRESSION/DISCECTOMY FUSION 2 LEVELS;  Surgeon: Phylliss Bob, MD;  Location: [facility];  Service:  Orthopedics;  Laterality: N/A;  Anterior cervical decompression fusion, cervical 5-6, cervical 6-7 with instrumentation and allograft  . ANTERIOR CERVICAL DECOMP/DISCECTOMY FUSION N/A 05/23/2017   Procedure: ANTERIOR CERVICAL DECOMPRESSION FUSION, CERVICAL 4-5 WITH INSTRUMENTATION AND ALLOGRAFT;  Surgeon: Phylliss Bob, MD;  Location: [facility];  Service: Orthopedics;  Laterality: N/A;  ANTERIOR CERVICAL DECOMPRESSION FUSION, CERVICAL 4-5 WITH INSTRUMENTATION AND ALLOGRAFT; REQUEST 2 HOURS AND FLIP ROOM  . APPENDECTOMY  1973  . BACK SURGERY    . CHOLECYSTECTOMY  08/2006  . COLONOSCOPY    . ORIF FEMUR FRACTURE Left 06/29/2018   Procedure: OPEN REDUCTION INTERNAL FIXATION (ORIF) DISTAL FEMUR FRACTURE;  Surgeon: Milly Jakob, MD;  Location: [facility];  Service: Orthopedics;  Laterality: Left;  . TRANSTHORACIC ECHOCARDIOGRAM  02/2011   mild LVH, nl EF, mild diastolic dysfunction, no wall motion abnl    Family History  Problem Relation Age of Onset  . Stroke Father   . Colon cancer Cousin    Social History:  reports that she has been smoking cigarettes. She has a 35.00 pack-year smoking history. She has never used smokeless tobacco. She reports that she does not drink alcohol or use drugs.  Allergies:  Allergies  Allergen Reactions  . Benazepril Anaphylaxis, Swelling and Other (See Comments)    Angioedema, throat swelling  . Diclofenac Sodium Other (See Comments)    GI bleed  . Fluticasone-Salmeterol Hives, Itching and Swelling  Tongue was swollen  . Nsaids Other (See Comments)    Rectal bleeding  . Penicillins Hives and Rash    Has patient had a PCN reaction causing immediate rash, facial/tongue/throat swelling, SOB or lightheadedness with hypotension: no Has patient had a PCN reaction causing severe rash involving mucus membranes or skin necrosis: no Has patient had a PCN reaction that required hospitalization no Has patient had a PCN reaction occurring within the last 10 years: no If all  of the above answers are "NO", then may proceed with Cephalosporin use.   . Aspirin Other (See Comments)    Burns stomach  . Propoxyphene Other (See Comments)    Darvocet - Headache    No medications prior to admission.    No results found for this or any previous visit (from the past 48 hour(s)). No results found.  Review of Systems  All other systems reviewed and are negative.   There were no vitals taken for this visit. Physical Exam  Constitutional: She is oriented to person, place, and time. She appears well-developed.  HENT:  Head: Normocephalic and atraumatic.  Eyes: EOM are normal.  Neck: Neck supple.  Cardiovascular: Intact distal pulses.  Respiratory: Effort normal.  GI: Soft.  Musculoskeletal:       Arms: Neurological: She is alert and oriented to person, place, and time. She has normal reflexes.  Skin: Skin is warm and dry.  Psychiatric: She has a normal mood and affect. Her behavior is normal. Judgment and thought content normal.     Assessment/Plan I discussed these findings with her and her husband.  We will plan to remove the screw in the operating room next Wednesday.  Additionally we will advance her to 50% partial weightbearing, with follow-up in a month with new x-rays of the left femur.  This patient meets the AMA guidelines for morbid (severe) obesity with BMI >40, and I have recommended weight loss.  Jolyn Nap, MD 08/19/2018, 1:45 PM

## 2018-08-19 NOTE — Progress Notes (Signed)
On 08/14/2018 - LVMM and requested info of current MAR.  Attempted to call several times before leaving a message.  On 08/15/2018- at 0900am attempted to contact [facility] for info.  Contacted a [employer] with no voice mail to leave a message.  08/15/2018 at 1240pm- called again to [facility] and no one ever came to phone.  08/15/2018 at 1250pm- VM not yet set up .  08/18/2018- LVMM and asked someone to return call to obtain info on patient.  08/18/2018- Again requested info and receiveced.

## 2018-08-20 ENCOUNTER — Ambulatory Visit (HOSPITAL_COMMUNITY): Payer: Medicare Other | Admitting: Anesthesiology

## 2018-08-20 ENCOUNTER — Encounter (HOSPITAL_COMMUNITY): Payer: Self-pay | Admitting: *Deleted

## 2018-08-20 ENCOUNTER — Ambulatory Visit (HOSPITAL_COMMUNITY)
Admission: RE | Admit: 2018-08-20 | Discharge: 2018-08-20 | Disposition: A | Discharge status: Rehabilitation Facility | Payer: Medicare Other | Source: Ambulatory Visit | Attending: Orthopedic Surgery | Admitting: Orthopedic Surgery

## 2018-08-20 ENCOUNTER — Ambulatory Visit (HOSPITAL_COMMUNITY): Payer: Medicare Other

## 2018-08-20 ENCOUNTER — Encounter (HOSPITAL_COMMUNITY)
Admission: RE | Disposition: A | Discharge status: Rehabilitation Facility | Payer: Self-pay | Source: Ambulatory Visit | Attending: Orthopedic Surgery

## 2018-08-20 DIAGNOSIS — J449 Chronic obstructive pulmonary disease, unspecified: Secondary | ICD-10-CM | POA: Diagnosis not present

## 2018-08-20 DIAGNOSIS — Y831 Surgical operation with implant of artificial internal device as the cause of abnormal reaction of the patient, or of later complication, without mention of misadventure at the time of the procedure: Secondary | ICD-10-CM | POA: Insufficient documentation

## 2018-08-20 DIAGNOSIS — I10 Essential (primary) hypertension: Secondary | ICD-10-CM | POA: Insufficient documentation

## 2018-08-20 DIAGNOSIS — Z6841 Body Mass Index (BMI) 40.0 and over, adult: Secondary | ICD-10-CM | POA: Insufficient documentation

## 2018-08-20 DIAGNOSIS — F17210 Nicotine dependence, cigarettes, uncomplicated: Secondary | ICD-10-CM | POA: Diagnosis not present

## 2018-08-20 DIAGNOSIS — T8484XA Pain due to internal orthopedic prosthetic devices, implants and grafts, initial encounter: Secondary | ICD-10-CM | POA: Diagnosis not present

## 2018-08-20 DIAGNOSIS — E119 Type 2 diabetes mellitus without complications: Secondary | ICD-10-CM | POA: Diagnosis not present

## 2018-08-20 DIAGNOSIS — E785 Hyperlipidemia, unspecified: Secondary | ICD-10-CM | POA: Diagnosis not present

## 2018-08-20 HISTORY — DX: Chronic pain syndrome: G89.4

## 2018-08-20 HISTORY — DX: Nicotine dependence, cigarettes, uncomplicated: F17.210

## 2018-08-20 HISTORY — DX: Difficulty in walking, not elsewhere classified: R26.2

## 2018-08-20 HISTORY — DX: Chronic obstructive pulmonary disease, unspecified: J44.9

## 2018-08-20 HISTORY — DX: Wheezing: R06.2

## 2018-08-20 HISTORY — DX: Acute vaginitis: N76.0

## 2018-08-20 HISTORY — DX: Unspecified fall, subsequent encounter: W19.XXXD

## 2018-08-20 HISTORY — PX: HARDWARE REMOVAL: SHX979

## 2018-08-20 HISTORY — DX: Displaced intertrochanteric fracture of left femur, initial encounter for closed fracture: S72.142A

## 2018-08-20 HISTORY — DX: Hyperlipidemia, unspecified: E78.5

## 2018-08-20 HISTORY — DX: Anemia, unspecified: D64.9

## 2018-08-20 HISTORY — DX: Other irritable bowel syndrome: K58.8

## 2018-08-20 HISTORY — DX: Muscle weakness (generalized): M62.81

## 2018-08-20 HISTORY — DX: Other abnormalities of gait and mobility: R26.89

## 2018-08-20 HISTORY — DX: Pressure ulcer of sacral region, unspecified stage: L89.159

## 2018-08-20 HISTORY — DX: Urinary tract infection, site not specified: N39.0

## 2018-08-20 HISTORY — DX: Constipation, unspecified: K59.00

## 2018-08-20 HISTORY — DX: Heart failure, unspecified: I50.9

## 2018-08-20 LAB — CBC
HEMATOCRIT: 37.2 % (ref 36.0–46.0)
Hemoglobin: 11 g/dL — ABNORMAL LOW (ref 12.0–15.0)
MCH: 28.5 pg (ref 26.0–34.0)
MCHC: 29.6 g/dL — ABNORMAL LOW (ref 30.0–36.0)
MCV: 96.4 fL (ref 80.0–100.0)
PLATELETS: 378 10*3/uL (ref 150–400)
RBC: 3.86 MIL/uL — ABNORMAL LOW (ref 3.87–5.11)
RDW: 14.7 % (ref 11.5–15.5)
WBC: 7.4 10*3/uL (ref 4.0–10.5)
nRBC: 0 % (ref 0.0–0.2)

## 2018-08-20 LAB — BASIC METABOLIC PANEL
Anion gap: 9 (ref 5–15)
BUN: 13 mg/dL (ref 6–20)
CHLORIDE: 106 mmol/L (ref 98–111)
CO2: 24 mmol/L (ref 22–32)
CREATININE: 0.53 mg/dL (ref 0.44–1.00)
Calcium: 8.6 mg/dL — ABNORMAL LOW (ref 8.9–10.3)
GFR calc Af Amer: >60 mL/min (ref >60)
GFR calc non Af Amer: >60 mL/min (ref >60)
GLUCOSE: 72 mg/dL (ref 70–99)
Potassium: 3.7 mmol/L (ref 3.5–5.1)
Sodium: 139 mmol/L (ref 135–145)

## 2018-08-20 LAB — HEMOGLOBIN A1C
Hgb A1c MFr Bld: 4.9 % (ref 4.8–5.6)
Mean Plasma Glucose: 93.93 mg/dL

## 2018-08-20 LAB — GLUCOSE, CAPILLARY: Glucose-Capillary: 71 mg/dL (ref 70–99)

## 2018-08-20 SURGERY — REMOVAL, HARDWARE
Anesthesia: Monitor Anesthesia Care | Laterality: Left

## 2018-08-20 MED ORDER — FENTANYL CITRATE (PF) 100 MCG/2ML IJ SOLN
25.0000 ug | INTRAMUSCULAR | Status: DC | PRN
  Administered 2018-08-20 (×2): 50 ug via INTRAVENOUS

## 2018-08-20 MED ORDER — CLINDAMYCIN PHOSPHATE 900 MG/50ML IV SOLN
900.0000 mg | INTRAVENOUS | Status: AC
  Administered 2018-08-20: 900 mg via INTRAVENOUS
  Filled 2018-08-20: qty 50

## 2018-08-20 MED ORDER — FENTANYL CITRATE (PF) 100 MCG/2ML IJ SOLN
INTRAMUSCULAR | Status: DC | PRN
  Administered 2018-08-20: 50 ug via INTRAVENOUS

## 2018-08-20 MED ORDER — BUPIVACAINE-EPINEPHRINE 0.5% -1:200000 IJ SOLN
INTRAMUSCULAR | Status: DC | PRN
  Administered 2018-08-20: 5 mL

## 2018-08-20 MED ORDER — FENTANYL CITRATE (PF) 100 MCG/2ML IJ SOLN
INTRAMUSCULAR | Status: AC
  Filled 2018-08-20: qty 2

## 2018-08-20 MED ORDER — 0.9 % SODIUM CHLORIDE (POUR BTL) OPTIME
TOPICAL | Status: DC | PRN
  Administered 2018-08-20: 1000 mL

## 2018-08-20 MED ORDER — LIDOCAINE HCL (PF) 1 % IJ SOLN
INTRAMUSCULAR | Status: AC
  Filled 2018-08-20: qty 30

## 2018-08-20 MED ORDER — GLYCOPYRROLATE PF 0.2 MG/ML IJ SOSY
PREFILLED_SYRINGE | INTRAMUSCULAR | Status: AC
  Filled 2018-08-20: qty 1

## 2018-08-20 MED ORDER — BUPIVACAINE-EPINEPHRINE 0.5% -1:200000 IJ SOLN
INTRAMUSCULAR | Status: AC
  Filled 2018-08-20: qty 1

## 2018-08-20 MED ORDER — PROMETHAZINE HCL 25 MG/ML IJ SOLN: 6.2500 mg | INTRAMUSCULAR | Status: DC | PRN

## 2018-08-20 MED ORDER — LACTATED RINGERS IV SOLN
INTRAVENOUS | Status: DC
  Administered 2018-08-20: 08:00:00 via INTRAVENOUS

## 2018-08-20 MED ORDER — PROPOFOL 10 MG/ML IV BOLUS
INTRAVENOUS | Status: AC
  Filled 2018-08-20: qty 40

## 2018-08-20 MED ORDER — MIDAZOLAM HCL 5 MG/5ML IJ SOLN
INTRAMUSCULAR | Status: DC | PRN
  Administered 2018-08-20: 2 mg via INTRAVENOUS

## 2018-08-20 MED ORDER — MIDAZOLAM HCL 2 MG/2ML IJ SOLN
INTRAMUSCULAR | Status: AC
  Filled 2018-08-20: qty 2

## 2018-08-20 MED ORDER — PROPOFOL 10 MG/ML IV BOLUS
INTRAVENOUS | Status: AC
  Filled 2018-08-20: qty 20

## 2018-08-20 MED ORDER — LIDOCAINE HCL 1 % IJ SOLN
INTRAMUSCULAR | Status: DC | PRN
  Administered 2018-08-20: 5 mL

## 2018-08-20 MED ORDER — PROPOFOL 10 MG/ML IV BOLUS
INTRAVENOUS | Status: DC | PRN
  Administered 2018-08-20 (×2): 20 mg via INTRAVENOUS

## 2018-08-20 MED ORDER — PROPOFOL 500 MG/50ML IV EMUL
INTRAVENOUS | Status: DC | PRN
  Administered 2018-08-20: 100 ug/kg/min via INTRAVENOUS

## 2018-08-20 SURGICAL SUPPLY — 48 items
BAG SPEC THK2 15X12 ZIP CLS (MISCELLANEOUS) ×1
BAG ZIPLOCK 12X15 (MISCELLANEOUS) ×3 IMPLANT
BANDAGE ACE 6X5 VEL STRL LF (GAUZE/BANDAGES/DRESSINGS) ×3 IMPLANT
BANDAGE ESMARK 6X9 LF (GAUZE/BANDAGES/DRESSINGS) ×1 IMPLANT
BLADE SAW SGTL 11.0X1.19X90.0M (BLADE) IMPLANT
BNDG CMPR 9X6 STRL LF SNTH (GAUZE/BANDAGES/DRESSINGS) ×1
BNDG ESMARK 6X9 LF (GAUZE/BANDAGES/DRESSINGS) ×3
CLOSURE WOUND 1/2 X4 (GAUZE/BANDAGES/DRESSINGS) ×2
COVER MAYO STAND STRL (DRAPES) IMPLANT
COVER SURGICAL LIGHT HANDLE (MISCELLANEOUS) ×3 IMPLANT
COVER WAND RF STERILE (DRAPES) IMPLANT
CUFF TOURN SGL QUICK 34 (TOURNIQUET CUFF) ×3
CUFF TRNQT CYL 34X4X40X1 (TOURNIQUET CUFF) ×1 IMPLANT
DRAPE C-ARM 42X120 X-RAY (DRAPES) IMPLANT
DRAPE EXTREMITY T 121X128X90 (DRAPE) IMPLANT
DRAPE OEC MINIVIEW 54X84 (DRAPES) IMPLANT
DRAPE POUCH INSTRU U-SHP 10X18 (DRAPES) ×3 IMPLANT
DRAPE STERI IOBAN 125X83 (DRAPES) ×3 IMPLANT
DRAPE U-SHAPE 47X51 STRL (DRAPES) ×3 IMPLANT
DRSG EMULSION OIL 3X16 NADH (GAUZE/BANDAGES/DRESSINGS) ×3 IMPLANT
DRSG PAD ABDOMINAL 8X10 ST (GAUZE/BANDAGES/DRESSINGS) ×3 IMPLANT
DURAPREP 26ML APPLICATOR (WOUND CARE) ×3 IMPLANT
ELECT REM PT RETURN 15FT ADLT (MISCELLANEOUS) ×3 IMPLANT
GAUZE SPONGE 4X4 12PLY STRL (GAUZE/BANDAGES/DRESSINGS) ×3 IMPLANT
GLOVE BIOGEL M 7.0 STRL (GLOVE) IMPLANT
GLOVE BIOGEL PI IND STRL 7.5 (GLOVE) ×1 IMPLANT
GLOVE BIOGEL PI IND STRL 8.5 (GLOVE) ×1 IMPLANT
GLOVE BIOGEL PI INDICATOR 7.5 (GLOVE) ×2
GLOVE BIOGEL PI INDICATOR 8.5 (GLOVE) ×2
GLOVE ECLIPSE 8.0 STRL XLNG CF (GLOVE) IMPLANT
GLOVE ORTHO TXT STRL SZ7.5 (GLOVE) ×6 IMPLANT
GLOVE SURG ORTHO 8.0 STRL STRW (GLOVE) ×3 IMPLANT
GOWN STRL REUS W/TWL LRG LVL3 (GOWN DISPOSABLE) ×3 IMPLANT
GOWN STRL REUS W/TWL XL LVL3 (GOWN DISPOSABLE) ×6 IMPLANT
KIT BASIN OR (CUSTOM PROCEDURE TRAY) ×3 IMPLANT
MANIFOLD NEPTUNE II (INSTRUMENTS) ×3 IMPLANT
NS IRRIG 1000ML POUR BTL (IV SOLUTION) ×3 IMPLANT
PACK TOTAL JOINT (CUSTOM PROCEDURE TRAY) ×3 IMPLANT
PADDING CAST COTTON 6X4 STRL (CAST SUPPLIES) ×3 IMPLANT
POSITIONER SURGICAL ARM (MISCELLANEOUS) ×3 IMPLANT
STAPLER VISISTAT 35W (STAPLE) IMPLANT
STRIP CLOSURE SKIN 1/2X4 (GAUZE/BANDAGES/DRESSINGS) ×4 IMPLANT
SUT MNCRL AB 4-0 PS2 18 (SUTURE) IMPLANT
SUT VIC AB 1 CT1 36 (SUTURE) ×9 IMPLANT
SUT VIC AB 2-0 CT1 27 (SUTURE) ×3
SUT VIC AB 2-0 CT1 TAPERPNT 27 (SUTURE) ×1 IMPLANT
TOWEL OR 17X26 10 PK STRL BLUE (TOWEL DISPOSABLE) ×6 IMPLANT
WATER STERILE IRR 1000ML POUR (IV SOLUTION) ×3 IMPLANT

## 2018-08-20 NOTE — Transfer of Care (Signed)
Immediate Anesthesia Transfer of Care Note  Patient: Sandra Ferrell  Procedure(s) Performed: HARDWARE REMOVAL FROM LEFT KNEE (Left )  Patient Location: PACU  Anesthesia Type:MAC  Level of Consciousness: awake, alert  and oriented  Airway & Oxygen Therapy: Patient Spontanous Breathing and Patient connected to face mask oxygen  Post-op Assessment: Report given to RN and Post -op Vital signs reviewed and stable  Post vital signs: Reviewed and stable  Last Vitals:  Vitals Value Taken Time  BP    Temp    Pulse    Resp    SpO2      Last Pain:  Vitals:   08/20/18 0748  TempSrc:   PainSc: 0-No pain         Complications: No apparent anesthesia complications

## 2018-08-20 NOTE — Interval H&P Note (Signed)
History and Physical Interval Note:  08/20/2018 8:32 AM  Sandra Ferrell  has presented today for surgery, with the diagnosis of retained hardware  The various methods of treatment have been discussed with the patient and family. After consideration of risks, benefits and other options for treatment, the patient has consented to  Procedure(s): HARDWARE REMOVAL FROM LEFT KNEE (Left) as a surgical intervention .  The patient's history has been reviewed, patient examined, no change in status, stable for surgery.  I have reviewed the patient's chart and labs.  Questions were answered to the patient's satisfaction.     Jolyn Nap

## 2018-08-20 NOTE — Progress Notes (Addendum)
[facility] sent patients meds with her this morning. I gave the meds to her husband. There are 5 medications in the bag. 3 Xanax, 33 hydrocodone, 29 dicyclomine, 16 gabapentin, 29 ranitidine. Patients husband agrees to hold onto medication and take back to [facility] when she is discharged.

## 2018-08-20 NOTE — Progress Notes (Signed)
Transportation arranged to take pt back to [facility].

## 2018-08-20 NOTE — Discharge Instructions (Signed)
Discharge Instructions -- Lower Extremity Fracture   Begin dry dressing changes tomorrow.  Re-dress with light gauze dressing each day until drainage has stopped, then dressings may stop Elevate your foot to reduce pain & swelling of the toes.  Ice over the operative site may be helpful to reduce pain & swelling.  DO NOT USE HEAT. You may have already made your follow-up appointment when we completed your preop visit.  If not, please call our office today or the next business day to make your return appointment for 10-15 days after surgery.     Please call [phone] during normal business hours or [phone] after hours for any problems. Including the following:  - excessive redness of the incisions - drainage for more than 4 days - fever of more than 101.5 F  *Please note that pain medications will not be refilled after hours or on weekends.

## 2018-08-20 NOTE — Progress Notes (Signed)
Report given to [name] on pt at [facility] @ [address] transporter called at 10:59, stated he was pulling into [facility] now.   Pt stated that she had a blanket prior to coming in the hospital that was not with her belongings. Went to short stay to locate blanket. Short stay stated that it was one of [facility]'s blankets that she had with her and was not forwarded to PACU.   Sandra Ferrell

## 2018-08-20 NOTE — Anesthesia Preprocedure Evaluation (Signed)
Anesthesia Evaluation  Patient identified by MRN, date of birth, ID band Patient awake    Reviewed: Allergy & Precautions, NPO status , Patient's Chart, lab work & pertinent test results  Airway Mallampati: II  TM Distance: <3 FB Neck ROM: Full    Dental no notable dental hx.    Pulmonary sleep apnea , COPD, Current Smoker,    breath sounds clear to auscultation + decreased breath sounds      Cardiovascular hypertension, Normal cardiovascular exam Rhythm:Regular Rate:Normal     Neuro/Psych negative neurological ROS  negative psych ROS   GI/Hepatic Neg liver ROS, GERD  ,  Endo/Other  diabetesMorbid obesity  Renal/GU Renal disease  negative genitourinary   Musculoskeletal negative musculoskeletal ROS (+)   Abdominal (+) + obese,   Peds negative pediatric ROS (+)  Hematology negative hematology ROS (+)   Anesthesia Other Findings   Reproductive/Obstetrics negative OB ROS                             Anesthesia Physical Anesthesia Plan  ASA: IV  Anesthesia Plan: MAC   Post-op Pain Management:    Induction: Intravenous  PONV Risk Score and Plan: 0  Airway Management Planned: Simple Face Mask  Additional Equipment:   Intra-op Plan:   Post-operative Plan:   Informed Consent: I have reviewed the patients History and Physical, chart, labs and discussed the procedure including the risks, benefits and alternatives for the proposed anesthesia with the patient or authorized representative who has indicated his/her understanding and acceptance.   Dental advisory given  Plan Discussed with: CRNA and Surgeon  Anesthesia Plan Comments:         Anesthesia Quick Evaluation

## 2018-08-20 NOTE — Op Note (Signed)
08/20/2018  8:32 AM  PATIENT:  Sandra Ferrell  57 y.o. female  PRE-OPERATIVE DIAGNOSIS:  Painful displaced hardware left distal thigh  POST-OPERATIVE DIAGNOSIS:  Same  PROCEDURE:  Removal of hardware from left knee  SURGEON: Rayvon Char. Grandville Silos, MD  PHYSICIAN ASSISTANT: Morley Kos, OPA-C  ANESTHESIA:  local and MAC  SPECIMENS:  None  DRAINS:   None  EBL:  less than 50 mL  PREOPERATIVE INDICATIONS:  Sandra Ferrell is a  57 y.o. female with h/o ORIF left distal femur fx, now with backing out of most distal screw  The risks benefits and alternatives were discussed with the patient preoperatively including but not limited to the risks of infection, bleeding, nerve injury, cardiopulmonary complications, the need for revision surgery, among others, and the patient verbalized understanding and consented to proceed.  OPERATIVE IMPLANTS: screw removed  OPERATIVE PROCEDURE:  After receiving prophylactic antibiotics, the patient was escorted to the operative theatre and placed in a supine position.  A surgical "time-out" was performed during which the planned procedure, proposed operative site, and the correct patient identity were compared to the operative consent and agreement confirmed by the circulating nurse according to current facility policy. The site was anesthetized by me with local anesthetic, a mixture of lidocaine and Marcaine Baring epinephrine. The exposed skin was prepped with Chloraprep and draped in the usual sterile fashion.    A small stab wound was made with the previous screws been inserted.  The screw was grasped with a hemostat and removed.  With firm palpation about the wound, there was no significant expressible fluid.  There was a little bit of flocculent material about the screw, where it rested in the subcutaneous tissues.  The wound was then copiously irrigated and was not gaping, so decision was made to leave the wound open and close secondarily.  The  dressing was applied and she was taken to the recovery room in stable condition.  DISPOSITION: She will transfer back to her skilled nursing facility, with wound care instructions, 50% partial weightbearing, with follow-up in 10 to 15 days, at which time there should be new x-rays of the left femur.

## 2018-08-20 NOTE — Anesthesia Postprocedure Evaluation (Signed)
Anesthesia Post Note  Patient: Sandra Ferrell  Procedure(s) Performed: HARDWARE REMOVAL FROM LEFT KNEE (Left )     Patient location during evaluation: PACU Anesthesia Type: MAC Level of consciousness: awake and alert Pain management: pain level controlled Vital Signs Assessment: post-procedure vital signs reviewed and stable Respiratory status: spontaneous breathing, nonlabored ventilation, respiratory function stable and patient connected to nasal cannula oxygen Cardiovascular status: stable and blood pressure returned to baseline Postop Assessment: no apparent nausea or vomiting Anesthetic complications: no    Last Vitals:  Vitals:   08/20/18 0945 08/20/18 0955  BP: 131/71 125/78  Pulse: 92 82  Resp: (!) 9 12  Temp:  (!) 36.4 C  SpO2: (!) 85% 98%    Last Pain:  Vitals:   08/20/18 0955  TempSrc:   PainSc: 0-No pain                 Mattis Featherly S

## 2018-08-21 ENCOUNTER — Encounter (HOSPITAL_COMMUNITY): Payer: Self-pay | Admitting: Orthopedic Surgery

## 2018-08-27 DIAGNOSIS — E119 Type 2 diabetes mellitus without complications: Secondary | ICD-10-CM | POA: Diagnosis not present

## 2018-08-27 DIAGNOSIS — J449 Chronic obstructive pulmonary disease, unspecified: Secondary | ICD-10-CM | POA: Diagnosis not present

## 2018-08-27 DIAGNOSIS — S72352D Displaced comminuted fracture of shaft of left femur, subsequent encounter for closed fracture with routine healing: Secondary | ICD-10-CM | POA: Diagnosis not present

## 2018-08-30 DIAGNOSIS — Z794 Long term (current) use of insulin: Secondary | ICD-10-CM | POA: Diagnosis not present

## 2018-08-30 DIAGNOSIS — S72352D Displaced comminuted fracture of shaft of left femur, subsequent encounter for closed fracture with routine healing: Secondary | ICD-10-CM | POA: Diagnosis not present

## 2018-08-30 DIAGNOSIS — I110 Hypertensive heart disease with heart failure: Secondary | ICD-10-CM | POA: Diagnosis not present

## 2018-08-30 DIAGNOSIS — K219 Gastro-esophageal reflux disease without esophagitis: Secondary | ICD-10-CM | POA: Diagnosis not present

## 2018-08-30 DIAGNOSIS — G894 Chronic pain syndrome: Secondary | ICD-10-CM | POA: Diagnosis not present

## 2018-08-30 DIAGNOSIS — E785 Hyperlipidemia, unspecified: Secondary | ICD-10-CM | POA: Diagnosis not present

## 2018-08-30 DIAGNOSIS — D649 Anemia, unspecified: Secondary | ICD-10-CM | POA: Diagnosis not present

## 2018-08-30 DIAGNOSIS — Z87440 Personal history of urinary (tract) infections: Secondary | ICD-10-CM | POA: Diagnosis not present

## 2018-08-30 DIAGNOSIS — Z7951 Long term (current) use of inhaled steroids: Secondary | ICD-10-CM | POA: Diagnosis not present

## 2018-08-30 DIAGNOSIS — I5032 Chronic diastolic (congestive) heart failure: Secondary | ICD-10-CM | POA: Diagnosis not present

## 2018-08-30 DIAGNOSIS — Z9181 History of falling: Secondary | ICD-10-CM | POA: Diagnosis not present

## 2018-08-30 DIAGNOSIS — S72142D Displaced intertrochanteric fracture of left femur, subsequent encounter for closed fracture with routine healing: Secondary | ICD-10-CM | POA: Diagnosis not present

## 2018-08-30 DIAGNOSIS — E119 Type 2 diabetes mellitus without complications: Secondary | ICD-10-CM | POA: Diagnosis not present

## 2018-08-30 DIAGNOSIS — J449 Chronic obstructive pulmonary disease, unspecified: Secondary | ICD-10-CM | POA: Diagnosis not present

## 2018-09-02 ENCOUNTER — Telehealth: Payer: Self-pay | Admitting: *Deleted

## 2018-09-02 ENCOUNTER — Telehealth: Payer: Self-pay | Admitting: Family Medicine

## 2018-09-02 NOTE — Telephone Encounter (Signed)
Okay to cancel rehab follow up. Will see pt as already scheduled.

## 2018-09-02 NOTE — Telephone Encounter (Signed)
Copied from [place] [phone]. Topic: Quick Communication - Home Health Verbal Orders >> Sep 02, 2018 11:21 AM Rutherford Nail, NT wrote: Caller/Agency: Nicholette with [facility] Number: [phone] Requesting OT/PT/Skilled Nursing/Social Work:  Frequency:  1x a week for 3 weeks 1x ever other week for 6 week 3 prn visits  On plan of care there is a request for PT, OT, and medical social work eval

## 2018-09-02 NOTE — Telephone Encounter (Signed)
Okay to given verbal order as requested. 

## 2018-09-02 NOTE — Telephone Encounter (Addendum)
When would you like to [name] for Rehab follow up?  She is scheduled for a 6 month follow up in February.   Please advise.

## 2018-09-02 NOTE — Telephone Encounter (Signed)
Copied from [place] [phone]. Topic: General - Other >> Sep 02, 2018 12:47 PM Wynetta Emery, [place] C wrote: Reason for CRM: pt has a rehab follow up apt scheduled for 09/05/18, she said that she was unaware of an apt. (Rehab didn't tell her upon discharge) pt would like to cancel/reschedule and would like to make sure that this is okay?   Not showing a sooner apt w/ PCP to reschedule pt in, please assist.   CB:

## 2018-09-02 NOTE — Telephone Encounter (Signed)
Verbal orders given to Nicholette for  OT/PT/Skilled Nursing/Social Work:  Frequency: 1x a week for 3 weeks, 1x ever other week for 6 week, then 3 prn visits per Dr. [name].

## 2018-09-03 DIAGNOSIS — T8484XA Pain due to internal orthopedic prosthetic devices, implants and grafts, initial encounter: Secondary | ICD-10-CM | POA: Diagnosis not present

## 2018-09-03 DIAGNOSIS — S72492A Other fracture of lower end of left femur, initial encounter for closed fracture: Secondary | ICD-10-CM | POA: Diagnosis not present

## 2018-09-03 NOTE — Telephone Encounter (Signed)
Smita notified as instructed by telephone.  She is agreeable to following up in Feb as already scheduled.  Appointment cancelled on 09/05/18.

## 2018-09-04 ENCOUNTER — Telehealth: Payer: Self-pay

## 2018-09-04 DIAGNOSIS — J449 Chronic obstructive pulmonary disease, unspecified: Secondary | ICD-10-CM | POA: Diagnosis not present

## 2018-09-04 DIAGNOSIS — I110 Hypertensive heart disease with heart failure: Secondary | ICD-10-CM | POA: Diagnosis not present

## 2018-09-04 DIAGNOSIS — Z7951 Long term (current) use of inhaled steroids: Secondary | ICD-10-CM | POA: Diagnosis not present

## 2018-09-04 DIAGNOSIS — S72142D Displaced intertrochanteric fracture of left femur, subsequent encounter for closed fracture with routine healing: Secondary | ICD-10-CM | POA: Diagnosis not present

## 2018-09-04 DIAGNOSIS — D649 Anemia, unspecified: Secondary | ICD-10-CM | POA: Diagnosis not present

## 2018-09-04 DIAGNOSIS — Z87440 Personal history of urinary (tract) infections: Secondary | ICD-10-CM | POA: Diagnosis not present

## 2018-09-04 DIAGNOSIS — E119 Type 2 diabetes mellitus without complications: Secondary | ICD-10-CM | POA: Diagnosis not present

## 2018-09-04 DIAGNOSIS — S72352D Displaced comminuted fracture of shaft of left femur, subsequent encounter for closed fracture with routine healing: Secondary | ICD-10-CM | POA: Diagnosis not present

## 2018-09-04 DIAGNOSIS — I5032 Chronic diastolic (congestive) heart failure: Secondary | ICD-10-CM | POA: Diagnosis not present

## 2018-09-04 DIAGNOSIS — Z9181 History of falling: Secondary | ICD-10-CM | POA: Diagnosis not present

## 2018-09-04 DIAGNOSIS — G894 Chronic pain syndrome: Secondary | ICD-10-CM | POA: Diagnosis not present

## 2018-09-04 DIAGNOSIS — Z794 Long term (current) use of insulin: Secondary | ICD-10-CM | POA: Diagnosis not present

## 2018-09-04 DIAGNOSIS — E785 Hyperlipidemia, unspecified: Secondary | ICD-10-CM | POA: Diagnosis not present

## 2018-09-04 DIAGNOSIS — Z789 Other specified health status: Secondary | ICD-10-CM

## 2018-09-04 DIAGNOSIS — K219 Gastro-esophageal reflux disease without esophagitis: Secondary | ICD-10-CM | POA: Diagnosis not present

## 2018-09-04 NOTE — Telephone Encounter (Signed)
Metric gap present to consider statin therapy in a diabetic. Please review. Thank you

## 2018-09-05 ENCOUNTER — Ambulatory Visit: Payer: Medicare Other | Admitting: Family Medicine

## 2018-09-05 DIAGNOSIS — G894 Chronic pain syndrome: Secondary | ICD-10-CM | POA: Diagnosis not present

## 2018-09-05 DIAGNOSIS — Z7951 Long term (current) use of inhaled steroids: Secondary | ICD-10-CM | POA: Diagnosis not present

## 2018-09-05 DIAGNOSIS — S72352D Displaced comminuted fracture of shaft of left femur, subsequent encounter for closed fracture with routine healing: Secondary | ICD-10-CM | POA: Diagnosis not present

## 2018-09-05 DIAGNOSIS — J449 Chronic obstructive pulmonary disease, unspecified: Secondary | ICD-10-CM | POA: Diagnosis not present

## 2018-09-05 DIAGNOSIS — I5032 Chronic diastolic (congestive) heart failure: Secondary | ICD-10-CM | POA: Diagnosis not present

## 2018-09-05 DIAGNOSIS — E119 Type 2 diabetes mellitus without complications: Secondary | ICD-10-CM | POA: Diagnosis not present

## 2018-09-05 DIAGNOSIS — I110 Hypertensive heart disease with heart failure: Secondary | ICD-10-CM | POA: Diagnosis not present

## 2018-09-05 DIAGNOSIS — K219 Gastro-esophageal reflux disease without esophagitis: Secondary | ICD-10-CM | POA: Diagnosis not present

## 2018-09-05 DIAGNOSIS — S72142D Displaced intertrochanteric fracture of left femur, subsequent encounter for closed fracture with routine healing: Secondary | ICD-10-CM | POA: Diagnosis not present

## 2018-09-05 DIAGNOSIS — D649 Anemia, unspecified: Secondary | ICD-10-CM | POA: Diagnosis not present

## 2018-09-05 DIAGNOSIS — E785 Hyperlipidemia, unspecified: Secondary | ICD-10-CM | POA: Diagnosis not present

## 2018-09-05 DIAGNOSIS — Z789 Other specified health status: Secondary | ICD-10-CM | POA: Insufficient documentation

## 2018-09-05 DIAGNOSIS — Z794 Long term (current) use of insulin: Secondary | ICD-10-CM | POA: Diagnosis not present

## 2018-09-05 DIAGNOSIS — Z87440 Personal history of urinary (tract) infections: Secondary | ICD-10-CM | POA: Diagnosis not present

## 2018-09-05 DIAGNOSIS — Z9181 History of falling: Secondary | ICD-10-CM | POA: Diagnosis not present

## 2018-09-05 NOTE — Telephone Encounter (Signed)
pt high risk but intolerant of statins in past.  On gemfibrozil.

## 2018-09-05 NOTE — Telephone Encounter (Signed)
Noted. Per Ginger Vanness,with [facility], diagnoses of statin intolerance needs to be added to the chart and then patient should come off the gap list for this. Thank you

## 2018-09-08 DIAGNOSIS — E785 Hyperlipidemia, unspecified: Secondary | ICD-10-CM | POA: Diagnosis not present

## 2018-09-08 DIAGNOSIS — E119 Type 2 diabetes mellitus without complications: Secondary | ICD-10-CM | POA: Diagnosis not present

## 2018-09-08 DIAGNOSIS — K219 Gastro-esophageal reflux disease without esophagitis: Secondary | ICD-10-CM | POA: Diagnosis not present

## 2018-09-08 DIAGNOSIS — Z9181 History of falling: Secondary | ICD-10-CM | POA: Diagnosis not present

## 2018-09-08 DIAGNOSIS — G894 Chronic pain syndrome: Secondary | ICD-10-CM | POA: Diagnosis not present

## 2018-09-08 DIAGNOSIS — I5032 Chronic diastolic (congestive) heart failure: Secondary | ICD-10-CM | POA: Diagnosis not present

## 2018-09-08 DIAGNOSIS — J449 Chronic obstructive pulmonary disease, unspecified: Secondary | ICD-10-CM | POA: Diagnosis not present

## 2018-09-08 DIAGNOSIS — S72352D Displaced comminuted fracture of shaft of left femur, subsequent encounter for closed fracture with routine healing: Secondary | ICD-10-CM | POA: Diagnosis not present

## 2018-09-08 DIAGNOSIS — Z7951 Long term (current) use of inhaled steroids: Secondary | ICD-10-CM | POA: Diagnosis not present

## 2018-09-08 DIAGNOSIS — Z87440 Personal history of urinary (tract) infections: Secondary | ICD-10-CM | POA: Diagnosis not present

## 2018-09-08 DIAGNOSIS — D649 Anemia, unspecified: Secondary | ICD-10-CM | POA: Diagnosis not present

## 2018-09-08 DIAGNOSIS — Z794 Long term (current) use of insulin: Secondary | ICD-10-CM | POA: Diagnosis not present

## 2018-09-08 DIAGNOSIS — I110 Hypertensive heart disease with heart failure: Secondary | ICD-10-CM | POA: Diagnosis not present

## 2018-09-08 DIAGNOSIS — S72142D Displaced intertrochanteric fracture of left femur, subsequent encounter for closed fracture with routine healing: Secondary | ICD-10-CM | POA: Diagnosis not present

## 2018-09-09 ENCOUNTER — Telehealth: Payer: Self-pay

## 2018-09-09 DIAGNOSIS — Z87440 Personal history of urinary (tract) infections: Secondary | ICD-10-CM | POA: Diagnosis not present

## 2018-09-09 DIAGNOSIS — S72142D Displaced intertrochanteric fracture of left femur, subsequent encounter for closed fracture with routine healing: Secondary | ICD-10-CM | POA: Diagnosis not present

## 2018-09-09 DIAGNOSIS — E785 Hyperlipidemia, unspecified: Secondary | ICD-10-CM | POA: Diagnosis not present

## 2018-09-09 DIAGNOSIS — Z9181 History of falling: Secondary | ICD-10-CM | POA: Diagnosis not present

## 2018-09-09 DIAGNOSIS — J449 Chronic obstructive pulmonary disease, unspecified: Secondary | ICD-10-CM | POA: Diagnosis not present

## 2018-09-09 DIAGNOSIS — I5032 Chronic diastolic (congestive) heart failure: Secondary | ICD-10-CM | POA: Diagnosis not present

## 2018-09-09 DIAGNOSIS — D649 Anemia, unspecified: Secondary | ICD-10-CM | POA: Diagnosis not present

## 2018-09-09 DIAGNOSIS — S72352D Displaced comminuted fracture of shaft of left femur, subsequent encounter for closed fracture with routine healing: Secondary | ICD-10-CM | POA: Diagnosis not present

## 2018-09-09 DIAGNOSIS — Z794 Long term (current) use of insulin: Secondary | ICD-10-CM | POA: Diagnosis not present

## 2018-09-09 DIAGNOSIS — Z7951 Long term (current) use of inhaled steroids: Secondary | ICD-10-CM | POA: Diagnosis not present

## 2018-09-09 DIAGNOSIS — K219 Gastro-esophageal reflux disease without esophagitis: Secondary | ICD-10-CM | POA: Diagnosis not present

## 2018-09-09 DIAGNOSIS — G894 Chronic pain syndrome: Secondary | ICD-10-CM | POA: Diagnosis not present

## 2018-09-09 DIAGNOSIS — I110 Hypertensive heart disease with heart failure: Secondary | ICD-10-CM | POA: Diagnosis not present

## 2018-09-09 DIAGNOSIS — E119 Type 2 diabetes mellitus without complications: Secondary | ICD-10-CM | POA: Diagnosis not present

## 2018-09-09 NOTE — Telephone Encounter (Signed)
Carmryn with [employer] [employer] notified as instructed by telephone.  Medication list updated.

## 2018-09-09 NOTE — Telephone Encounter (Signed)
Sandra Ferrell with [facility] left v/m; pt has been taking zantac and since med recalled Sandra Ferrell wants to know what Dr [name] recommendation is for acid reduction. Pt has reported upset stomach and diarrhea for the last 3 days. Sandra Ferrell request cb.

## 2018-09-09 NOTE — Telephone Encounter (Signed)
Start prilosec 2 x 20 mg daily.

## 2018-09-11 ENCOUNTER — Telehealth: Payer: Self-pay | Admitting: *Deleted

## 2018-09-11 DIAGNOSIS — I110 Hypertensive heart disease with heart failure: Secondary | ICD-10-CM | POA: Diagnosis not present

## 2018-09-11 DIAGNOSIS — Z794 Long term (current) use of insulin: Secondary | ICD-10-CM | POA: Diagnosis not present

## 2018-09-11 DIAGNOSIS — Z9181 History of falling: Secondary | ICD-10-CM | POA: Diagnosis not present

## 2018-09-11 DIAGNOSIS — Z87440 Personal history of urinary (tract) infections: Secondary | ICD-10-CM | POA: Diagnosis not present

## 2018-09-11 DIAGNOSIS — S72142D Displaced intertrochanteric fracture of left femur, subsequent encounter for closed fracture with routine healing: Secondary | ICD-10-CM | POA: Diagnosis not present

## 2018-09-11 DIAGNOSIS — K219 Gastro-esophageal reflux disease without esophagitis: Secondary | ICD-10-CM | POA: Diagnosis not present

## 2018-09-11 DIAGNOSIS — E119 Type 2 diabetes mellitus without complications: Secondary | ICD-10-CM | POA: Diagnosis not present

## 2018-09-11 DIAGNOSIS — G894 Chronic pain syndrome: Secondary | ICD-10-CM | POA: Diagnosis not present

## 2018-09-11 DIAGNOSIS — D649 Anemia, unspecified: Secondary | ICD-10-CM | POA: Diagnosis not present

## 2018-09-11 DIAGNOSIS — I5032 Chronic diastolic (congestive) heart failure: Secondary | ICD-10-CM | POA: Diagnosis not present

## 2018-09-11 DIAGNOSIS — S72352D Displaced comminuted fracture of shaft of left femur, subsequent encounter for closed fracture with routine healing: Secondary | ICD-10-CM | POA: Diagnosis not present

## 2018-09-11 DIAGNOSIS — J449 Chronic obstructive pulmonary disease, unspecified: Secondary | ICD-10-CM | POA: Diagnosis not present

## 2018-09-11 DIAGNOSIS — Z7951 Long term (current) use of inhaled steroids: Secondary | ICD-10-CM | POA: Diagnosis not present

## 2018-09-11 DIAGNOSIS — E785 Hyperlipidemia, unspecified: Secondary | ICD-10-CM | POA: Diagnosis not present

## 2018-09-11 NOTE — Telephone Encounter (Signed)
Spoke to Sandra Ferrell with [facility] who states pt has not felt well x3-4 days, but reports she has not been taking her meds as directed. She has also missed checking her BS the last 3days. He did have her check it today and reports reading is 152. Wanted to advise as FYI

## 2018-09-12 DIAGNOSIS — J449 Chronic obstructive pulmonary disease, unspecified: Secondary | ICD-10-CM | POA: Diagnosis not present

## 2018-09-12 DIAGNOSIS — Z87440 Personal history of urinary (tract) infections: Secondary | ICD-10-CM | POA: Diagnosis not present

## 2018-09-12 DIAGNOSIS — S72142D Displaced intertrochanteric fracture of left femur, subsequent encounter for closed fracture with routine healing: Secondary | ICD-10-CM | POA: Diagnosis not present

## 2018-09-12 DIAGNOSIS — D649 Anemia, unspecified: Secondary | ICD-10-CM | POA: Diagnosis not present

## 2018-09-12 DIAGNOSIS — Z794 Long term (current) use of insulin: Secondary | ICD-10-CM | POA: Diagnosis not present

## 2018-09-12 DIAGNOSIS — I110 Hypertensive heart disease with heart failure: Secondary | ICD-10-CM | POA: Diagnosis not present

## 2018-09-12 DIAGNOSIS — Z7951 Long term (current) use of inhaled steroids: Secondary | ICD-10-CM | POA: Diagnosis not present

## 2018-09-12 DIAGNOSIS — S72352D Displaced comminuted fracture of shaft of left femur, subsequent encounter for closed fracture with routine healing: Secondary | ICD-10-CM | POA: Diagnosis not present

## 2018-09-12 DIAGNOSIS — E785 Hyperlipidemia, unspecified: Secondary | ICD-10-CM | POA: Diagnosis not present

## 2018-09-12 DIAGNOSIS — I5032 Chronic diastolic (congestive) heart failure: Secondary | ICD-10-CM | POA: Diagnosis not present

## 2018-09-12 DIAGNOSIS — K219 Gastro-esophageal reflux disease without esophagitis: Secondary | ICD-10-CM | POA: Diagnosis not present

## 2018-09-12 DIAGNOSIS — E119 Type 2 diabetes mellitus without complications: Secondary | ICD-10-CM | POA: Diagnosis not present

## 2018-09-12 DIAGNOSIS — Z9181 History of falling: Secondary | ICD-10-CM | POA: Diagnosis not present

## 2018-09-12 DIAGNOSIS — G894 Chronic pain syndrome: Secondary | ICD-10-CM | POA: Diagnosis not present

## 2018-09-12 NOTE — Telephone Encounter (Signed)
Noted  

## 2018-09-15 ENCOUNTER — Other Ambulatory Visit: Payer: Self-pay | Admitting: Family Medicine

## 2018-09-15 DIAGNOSIS — K219 Gastro-esophageal reflux disease without esophagitis: Secondary | ICD-10-CM | POA: Diagnosis not present

## 2018-09-15 DIAGNOSIS — Z87440 Personal history of urinary (tract) infections: Secondary | ICD-10-CM | POA: Diagnosis not present

## 2018-09-15 DIAGNOSIS — S72142D Displaced intertrochanteric fracture of left femur, subsequent encounter for closed fracture with routine healing: Secondary | ICD-10-CM | POA: Diagnosis not present

## 2018-09-15 DIAGNOSIS — E119 Type 2 diabetes mellitus without complications: Secondary | ICD-10-CM | POA: Diagnosis not present

## 2018-09-15 DIAGNOSIS — Z794 Long term (current) use of insulin: Secondary | ICD-10-CM | POA: Diagnosis not present

## 2018-09-15 DIAGNOSIS — Z7951 Long term (current) use of inhaled steroids: Secondary | ICD-10-CM | POA: Diagnosis not present

## 2018-09-15 DIAGNOSIS — G894 Chronic pain syndrome: Secondary | ICD-10-CM | POA: Diagnosis not present

## 2018-09-15 DIAGNOSIS — I5032 Chronic diastolic (congestive) heart failure: Secondary | ICD-10-CM | POA: Diagnosis not present

## 2018-09-15 DIAGNOSIS — J449 Chronic obstructive pulmonary disease, unspecified: Secondary | ICD-10-CM | POA: Diagnosis not present

## 2018-09-15 DIAGNOSIS — Z9181 History of falling: Secondary | ICD-10-CM | POA: Diagnosis not present

## 2018-09-15 DIAGNOSIS — D649 Anemia, unspecified: Secondary | ICD-10-CM | POA: Diagnosis not present

## 2018-09-15 DIAGNOSIS — E785 Hyperlipidemia, unspecified: Secondary | ICD-10-CM | POA: Diagnosis not present

## 2018-09-15 DIAGNOSIS — I110 Hypertensive heart disease with heart failure: Secondary | ICD-10-CM | POA: Diagnosis not present

## 2018-09-15 DIAGNOSIS — S72352D Displaced comminuted fracture of shaft of left femur, subsequent encounter for closed fracture with routine healing: Secondary | ICD-10-CM | POA: Diagnosis not present

## 2018-09-16 DIAGNOSIS — S72142D Displaced intertrochanteric fracture of left femur, subsequent encounter for closed fracture with routine healing: Secondary | ICD-10-CM | POA: Diagnosis not present

## 2018-09-16 DIAGNOSIS — Z7951 Long term (current) use of inhaled steroids: Secondary | ICD-10-CM | POA: Diagnosis not present

## 2018-09-16 DIAGNOSIS — I110 Hypertensive heart disease with heart failure: Secondary | ICD-10-CM | POA: Diagnosis not present

## 2018-09-16 DIAGNOSIS — Z794 Long term (current) use of insulin: Secondary | ICD-10-CM | POA: Diagnosis not present

## 2018-09-16 DIAGNOSIS — Z9181 History of falling: Secondary | ICD-10-CM | POA: Diagnosis not present

## 2018-09-16 DIAGNOSIS — E119 Type 2 diabetes mellitus without complications: Secondary | ICD-10-CM | POA: Diagnosis not present

## 2018-09-16 DIAGNOSIS — Z87440 Personal history of urinary (tract) infections: Secondary | ICD-10-CM | POA: Diagnosis not present

## 2018-09-16 DIAGNOSIS — I5032 Chronic diastolic (congestive) heart failure: Secondary | ICD-10-CM | POA: Diagnosis not present

## 2018-09-16 DIAGNOSIS — K219 Gastro-esophageal reflux disease without esophagitis: Secondary | ICD-10-CM | POA: Diagnosis not present

## 2018-09-16 DIAGNOSIS — J449 Chronic obstructive pulmonary disease, unspecified: Secondary | ICD-10-CM | POA: Diagnosis not present

## 2018-09-16 DIAGNOSIS — D649 Anemia, unspecified: Secondary | ICD-10-CM | POA: Diagnosis not present

## 2018-09-16 DIAGNOSIS — E785 Hyperlipidemia, unspecified: Secondary | ICD-10-CM | POA: Diagnosis not present

## 2018-09-16 DIAGNOSIS — S72352D Displaced comminuted fracture of shaft of left femur, subsequent encounter for closed fracture with routine healing: Secondary | ICD-10-CM | POA: Diagnosis not present

## 2018-09-16 DIAGNOSIS — G894 Chronic pain syndrome: Secondary | ICD-10-CM | POA: Diagnosis not present

## 2018-09-16 NOTE — Telephone Encounter (Signed)
Last office visit 06/20/2018 for [facility].  Last refilled 07/03/2018 for #15 with no refills by Dr. Tawanna Solo.  Next appt: 12/23/2018.

## 2018-09-18 DIAGNOSIS — S72142D Displaced intertrochanteric fracture of left femur, subsequent encounter for closed fracture with routine healing: Secondary | ICD-10-CM | POA: Diagnosis not present

## 2018-09-18 DIAGNOSIS — Z9181 History of falling: Secondary | ICD-10-CM | POA: Diagnosis not present

## 2018-09-18 DIAGNOSIS — Z794 Long term (current) use of insulin: Secondary | ICD-10-CM | POA: Diagnosis not present

## 2018-09-18 DIAGNOSIS — I5032 Chronic diastolic (congestive) heart failure: Secondary | ICD-10-CM | POA: Diagnosis not present

## 2018-09-18 DIAGNOSIS — Z87440 Personal history of urinary (tract) infections: Secondary | ICD-10-CM | POA: Diagnosis not present

## 2018-09-18 DIAGNOSIS — J449 Chronic obstructive pulmonary disease, unspecified: Secondary | ICD-10-CM | POA: Diagnosis not present

## 2018-09-18 DIAGNOSIS — I110 Hypertensive heart disease with heart failure: Secondary | ICD-10-CM | POA: Diagnosis not present

## 2018-09-18 DIAGNOSIS — E119 Type 2 diabetes mellitus without complications: Secondary | ICD-10-CM | POA: Diagnosis not present

## 2018-09-18 DIAGNOSIS — Z7951 Long term (current) use of inhaled steroids: Secondary | ICD-10-CM | POA: Diagnosis not present

## 2018-09-18 DIAGNOSIS — S72352D Displaced comminuted fracture of shaft of left femur, subsequent encounter for closed fracture with routine healing: Secondary | ICD-10-CM | POA: Diagnosis not present

## 2018-09-18 DIAGNOSIS — E785 Hyperlipidemia, unspecified: Secondary | ICD-10-CM | POA: Diagnosis not present

## 2018-09-18 DIAGNOSIS — D649 Anemia, unspecified: Secondary | ICD-10-CM | POA: Diagnosis not present

## 2018-09-18 DIAGNOSIS — G894 Chronic pain syndrome: Secondary | ICD-10-CM | POA: Diagnosis not present

## 2018-09-18 DIAGNOSIS — K219 Gastro-esophageal reflux disease without esophagitis: Secondary | ICD-10-CM | POA: Diagnosis not present

## 2018-09-22 DIAGNOSIS — Z794 Long term (current) use of insulin: Secondary | ICD-10-CM | POA: Diagnosis not present

## 2018-09-22 DIAGNOSIS — Z87440 Personal history of urinary (tract) infections: Secondary | ICD-10-CM | POA: Diagnosis not present

## 2018-09-22 DIAGNOSIS — G894 Chronic pain syndrome: Secondary | ICD-10-CM | POA: Diagnosis not present

## 2018-09-22 DIAGNOSIS — Z7951 Long term (current) use of inhaled steroids: Secondary | ICD-10-CM | POA: Diagnosis not present

## 2018-09-22 DIAGNOSIS — D649 Anemia, unspecified: Secondary | ICD-10-CM | POA: Diagnosis not present

## 2018-09-22 DIAGNOSIS — I110 Hypertensive heart disease with heart failure: Secondary | ICD-10-CM | POA: Diagnosis not present

## 2018-09-22 DIAGNOSIS — I5032 Chronic diastolic (congestive) heart failure: Secondary | ICD-10-CM | POA: Diagnosis not present

## 2018-09-22 DIAGNOSIS — S72142D Displaced intertrochanteric fracture of left femur, subsequent encounter for closed fracture with routine healing: Secondary | ICD-10-CM | POA: Diagnosis not present

## 2018-09-22 DIAGNOSIS — S72352D Displaced comminuted fracture of shaft of left femur, subsequent encounter for closed fracture with routine healing: Secondary | ICD-10-CM | POA: Diagnosis not present

## 2018-09-22 DIAGNOSIS — E785 Hyperlipidemia, unspecified: Secondary | ICD-10-CM | POA: Diagnosis not present

## 2018-09-22 DIAGNOSIS — J449 Chronic obstructive pulmonary disease, unspecified: Secondary | ICD-10-CM | POA: Diagnosis not present

## 2018-09-22 DIAGNOSIS — E119 Type 2 diabetes mellitus without complications: Secondary | ICD-10-CM | POA: Diagnosis not present

## 2018-09-22 DIAGNOSIS — Z9181 History of falling: Secondary | ICD-10-CM | POA: Diagnosis not present

## 2018-09-22 DIAGNOSIS — K219 Gastro-esophageal reflux disease without esophagitis: Secondary | ICD-10-CM | POA: Diagnosis not present

## 2018-09-23 DIAGNOSIS — I5032 Chronic diastolic (congestive) heart failure: Secondary | ICD-10-CM | POA: Diagnosis not present

## 2018-09-23 DIAGNOSIS — E119 Type 2 diabetes mellitus without complications: Secondary | ICD-10-CM | POA: Diagnosis not present

## 2018-09-23 DIAGNOSIS — K219 Gastro-esophageal reflux disease without esophagitis: Secondary | ICD-10-CM | POA: Diagnosis not present

## 2018-09-23 DIAGNOSIS — I110 Hypertensive heart disease with heart failure: Secondary | ICD-10-CM | POA: Diagnosis not present

## 2018-09-23 DIAGNOSIS — E785 Hyperlipidemia, unspecified: Secondary | ICD-10-CM | POA: Diagnosis not present

## 2018-09-23 DIAGNOSIS — G894 Chronic pain syndrome: Secondary | ICD-10-CM | POA: Diagnosis not present

## 2018-09-23 DIAGNOSIS — S72142D Displaced intertrochanteric fracture of left femur, subsequent encounter for closed fracture with routine healing: Secondary | ICD-10-CM | POA: Diagnosis not present

## 2018-09-23 DIAGNOSIS — Z87440 Personal history of urinary (tract) infections: Secondary | ICD-10-CM | POA: Diagnosis not present

## 2018-09-23 DIAGNOSIS — Z7951 Long term (current) use of inhaled steroids: Secondary | ICD-10-CM | POA: Diagnosis not present

## 2018-09-23 DIAGNOSIS — Z9181 History of falling: Secondary | ICD-10-CM | POA: Diagnosis not present

## 2018-09-23 DIAGNOSIS — Z794 Long term (current) use of insulin: Secondary | ICD-10-CM | POA: Diagnosis not present

## 2018-09-23 DIAGNOSIS — S72352D Displaced comminuted fracture of shaft of left femur, subsequent encounter for closed fracture with routine healing: Secondary | ICD-10-CM | POA: Diagnosis not present

## 2018-09-23 DIAGNOSIS — J449 Chronic obstructive pulmonary disease, unspecified: Secondary | ICD-10-CM | POA: Diagnosis not present

## 2018-09-23 DIAGNOSIS — D649 Anemia, unspecified: Secondary | ICD-10-CM | POA: Diagnosis not present

## 2018-09-24 ENCOUNTER — Telehealth: Payer: Self-pay | Admitting: Family Medicine

## 2018-09-24 DIAGNOSIS — S72142D Displaced intertrochanteric fracture of left femur, subsequent encounter for closed fracture with routine healing: Secondary | ICD-10-CM | POA: Diagnosis not present

## 2018-09-24 DIAGNOSIS — E785 Hyperlipidemia, unspecified: Secondary | ICD-10-CM | POA: Diagnosis not present

## 2018-09-24 DIAGNOSIS — K219 Gastro-esophageal reflux disease without esophagitis: Secondary | ICD-10-CM | POA: Diagnosis not present

## 2018-09-24 DIAGNOSIS — I5032 Chronic diastolic (congestive) heart failure: Secondary | ICD-10-CM | POA: Diagnosis not present

## 2018-09-24 DIAGNOSIS — Z7951 Long term (current) use of inhaled steroids: Secondary | ICD-10-CM | POA: Diagnosis not present

## 2018-09-24 DIAGNOSIS — I110 Hypertensive heart disease with heart failure: Secondary | ICD-10-CM | POA: Diagnosis not present

## 2018-09-24 DIAGNOSIS — S72352D Displaced comminuted fracture of shaft of left femur, subsequent encounter for closed fracture with routine healing: Secondary | ICD-10-CM | POA: Diagnosis not present

## 2018-09-24 DIAGNOSIS — Z9181 History of falling: Secondary | ICD-10-CM | POA: Diagnosis not present

## 2018-09-24 DIAGNOSIS — D649 Anemia, unspecified: Secondary | ICD-10-CM | POA: Diagnosis not present

## 2018-09-24 DIAGNOSIS — E119 Type 2 diabetes mellitus without complications: Secondary | ICD-10-CM | POA: Diagnosis not present

## 2018-09-24 DIAGNOSIS — J449 Chronic obstructive pulmonary disease, unspecified: Secondary | ICD-10-CM | POA: Diagnosis not present

## 2018-09-24 DIAGNOSIS — G894 Chronic pain syndrome: Secondary | ICD-10-CM | POA: Diagnosis not present

## 2018-09-24 DIAGNOSIS — Z87440 Personal history of urinary (tract) infections: Secondary | ICD-10-CM | POA: Diagnosis not present

## 2018-09-24 DIAGNOSIS — Z794 Long term (current) use of insulin: Secondary | ICD-10-CM | POA: Diagnosis not present

## 2018-09-24 NOTE — Telephone Encounter (Signed)
[place] called the office requesting verbal order for twice a week for 4 more weeks, she stated the pt is still not walking. Best cb number is [phone].

## 2018-09-25 DIAGNOSIS — E119 Type 2 diabetes mellitus without complications: Secondary | ICD-10-CM | POA: Diagnosis not present

## 2018-09-25 DIAGNOSIS — S72352D Displaced comminuted fracture of shaft of left femur, subsequent encounter for closed fracture with routine healing: Secondary | ICD-10-CM | POA: Diagnosis not present

## 2018-09-25 DIAGNOSIS — K219 Gastro-esophageal reflux disease without esophagitis: Secondary | ICD-10-CM | POA: Diagnosis not present

## 2018-09-25 DIAGNOSIS — I5032 Chronic diastolic (congestive) heart failure: Secondary | ICD-10-CM | POA: Diagnosis not present

## 2018-09-25 DIAGNOSIS — I110 Hypertensive heart disease with heart failure: Secondary | ICD-10-CM | POA: Diagnosis not present

## 2018-09-25 DIAGNOSIS — S72142D Displaced intertrochanteric fracture of left femur, subsequent encounter for closed fracture with routine healing: Secondary | ICD-10-CM | POA: Diagnosis not present

## 2018-09-25 DIAGNOSIS — S72492S Other fracture of lower end of left femur, sequela: Secondary | ICD-10-CM | POA: Diagnosis not present

## 2018-09-25 DIAGNOSIS — Z794 Long term (current) use of insulin: Secondary | ICD-10-CM | POA: Diagnosis not present

## 2018-09-25 DIAGNOSIS — D649 Anemia, unspecified: Secondary | ICD-10-CM | POA: Diagnosis not present

## 2018-09-25 DIAGNOSIS — Z7951 Long term (current) use of inhaled steroids: Secondary | ICD-10-CM | POA: Diagnosis not present

## 2018-09-25 DIAGNOSIS — E785 Hyperlipidemia, unspecified: Secondary | ICD-10-CM | POA: Diagnosis not present

## 2018-09-25 DIAGNOSIS — G894 Chronic pain syndrome: Secondary | ICD-10-CM | POA: Diagnosis not present

## 2018-09-25 DIAGNOSIS — Z87440 Personal history of urinary (tract) infections: Secondary | ICD-10-CM | POA: Diagnosis not present

## 2018-09-25 DIAGNOSIS — J449 Chronic obstructive pulmonary disease, unspecified: Secondary | ICD-10-CM | POA: Diagnosis not present

## 2018-09-25 DIAGNOSIS — Z9181 History of falling: Secondary | ICD-10-CM | POA: Diagnosis not present

## 2018-09-25 NOTE — Telephone Encounter (Signed)
Left message for Sandra Ferrell to call our office back to clarify what she is needing orders for.  PT, OT, Nursing, etc.  Not mentioned in earlier message.

## 2018-09-26 ENCOUNTER — Telehealth: Payer: Self-pay | Admitting: Family Medicine

## 2018-09-26 DIAGNOSIS — D649 Anemia, unspecified: Secondary | ICD-10-CM | POA: Diagnosis not present

## 2018-09-26 DIAGNOSIS — S72142D Displaced intertrochanteric fracture of left femur, subsequent encounter for closed fracture with routine healing: Secondary | ICD-10-CM | POA: Diagnosis not present

## 2018-09-26 DIAGNOSIS — Z9181 History of falling: Secondary | ICD-10-CM | POA: Diagnosis not present

## 2018-09-26 DIAGNOSIS — Z7951 Long term (current) use of inhaled steroids: Secondary | ICD-10-CM | POA: Diagnosis not present

## 2018-09-26 DIAGNOSIS — I5032 Chronic diastolic (congestive) heart failure: Secondary | ICD-10-CM | POA: Diagnosis not present

## 2018-09-26 DIAGNOSIS — Z794 Long term (current) use of insulin: Secondary | ICD-10-CM | POA: Diagnosis not present

## 2018-09-26 DIAGNOSIS — S72352D Displaced comminuted fracture of shaft of left femur, subsequent encounter for closed fracture with routine healing: Secondary | ICD-10-CM | POA: Diagnosis not present

## 2018-09-26 DIAGNOSIS — G894 Chronic pain syndrome: Secondary | ICD-10-CM | POA: Diagnosis not present

## 2018-09-26 DIAGNOSIS — J449 Chronic obstructive pulmonary disease, unspecified: Secondary | ICD-10-CM | POA: Diagnosis not present

## 2018-09-26 DIAGNOSIS — Z87440 Personal history of urinary (tract) infections: Secondary | ICD-10-CM | POA: Diagnosis not present

## 2018-09-26 DIAGNOSIS — I110 Hypertensive heart disease with heart failure: Secondary | ICD-10-CM | POA: Diagnosis not present

## 2018-09-26 DIAGNOSIS — E785 Hyperlipidemia, unspecified: Secondary | ICD-10-CM | POA: Diagnosis not present

## 2018-09-26 DIAGNOSIS — E119 Type 2 diabetes mellitus without complications: Secondary | ICD-10-CM | POA: Diagnosis not present

## 2018-09-26 DIAGNOSIS — K219 Gastro-esophageal reflux disease without esophagitis: Secondary | ICD-10-CM | POA: Diagnosis not present

## 2018-09-26 NOTE — Telephone Encounter (Signed)
error 

## 2018-09-26 NOTE — Telephone Encounter (Signed)
Kendra/Physical Therapist from [facility] returned Sandra Ferrell's call. The verbal order request is for physical therapy for twice a week for 4 weeks.

## 2018-09-26 NOTE — Telephone Encounter (Signed)
Left message for Sandra Ferrell giving verbal order for PT 2 x a week for 4 weeks.

## 2018-09-29 DIAGNOSIS — Z7951 Long term (current) use of inhaled steroids: Secondary | ICD-10-CM | POA: Diagnosis not present

## 2018-09-29 DIAGNOSIS — E119 Type 2 diabetes mellitus without complications: Secondary | ICD-10-CM | POA: Diagnosis not present

## 2018-09-29 DIAGNOSIS — Z794 Long term (current) use of insulin: Secondary | ICD-10-CM | POA: Diagnosis not present

## 2018-09-29 DIAGNOSIS — G894 Chronic pain syndrome: Secondary | ICD-10-CM | POA: Diagnosis not present

## 2018-09-29 DIAGNOSIS — K219 Gastro-esophageal reflux disease without esophagitis: Secondary | ICD-10-CM | POA: Diagnosis not present

## 2018-09-29 DIAGNOSIS — I5032 Chronic diastolic (congestive) heart failure: Secondary | ICD-10-CM | POA: Diagnosis not present

## 2018-09-29 DIAGNOSIS — D649 Anemia, unspecified: Secondary | ICD-10-CM | POA: Diagnosis not present

## 2018-09-29 DIAGNOSIS — Z9181 History of falling: Secondary | ICD-10-CM | POA: Diagnosis not present

## 2018-09-29 DIAGNOSIS — E785 Hyperlipidemia, unspecified: Secondary | ICD-10-CM | POA: Diagnosis not present

## 2018-09-29 DIAGNOSIS — J449 Chronic obstructive pulmonary disease, unspecified: Secondary | ICD-10-CM | POA: Diagnosis not present

## 2018-09-29 DIAGNOSIS — S72352D Displaced comminuted fracture of shaft of left femur, subsequent encounter for closed fracture with routine healing: Secondary | ICD-10-CM | POA: Diagnosis not present

## 2018-09-29 DIAGNOSIS — I110 Hypertensive heart disease with heart failure: Secondary | ICD-10-CM | POA: Diagnosis not present

## 2018-09-29 DIAGNOSIS — S72142D Displaced intertrochanteric fracture of left femur, subsequent encounter for closed fracture with routine healing: Secondary | ICD-10-CM | POA: Diagnosis not present

## 2018-09-29 DIAGNOSIS — Z87440 Personal history of urinary (tract) infections: Secondary | ICD-10-CM | POA: Diagnosis not present

## 2018-09-30 DIAGNOSIS — I110 Hypertensive heart disease with heart failure: Secondary | ICD-10-CM | POA: Diagnosis not present

## 2018-09-30 DIAGNOSIS — E785 Hyperlipidemia, unspecified: Secondary | ICD-10-CM | POA: Diagnosis not present

## 2018-09-30 DIAGNOSIS — E119 Type 2 diabetes mellitus without complications: Secondary | ICD-10-CM | POA: Diagnosis not present

## 2018-09-30 DIAGNOSIS — S72142D Displaced intertrochanteric fracture of left femur, subsequent encounter for closed fracture with routine healing: Secondary | ICD-10-CM | POA: Diagnosis not present

## 2018-09-30 DIAGNOSIS — J449 Chronic obstructive pulmonary disease, unspecified: Secondary | ICD-10-CM | POA: Diagnosis not present

## 2018-09-30 DIAGNOSIS — Z794 Long term (current) use of insulin: Secondary | ICD-10-CM | POA: Diagnosis not present

## 2018-09-30 DIAGNOSIS — I5032 Chronic diastolic (congestive) heart failure: Secondary | ICD-10-CM | POA: Diagnosis not present

## 2018-09-30 DIAGNOSIS — Z9181 History of falling: Secondary | ICD-10-CM | POA: Diagnosis not present

## 2018-09-30 DIAGNOSIS — S72352D Displaced comminuted fracture of shaft of left femur, subsequent encounter for closed fracture with routine healing: Secondary | ICD-10-CM | POA: Diagnosis not present

## 2018-09-30 DIAGNOSIS — Z87440 Personal history of urinary (tract) infections: Secondary | ICD-10-CM | POA: Diagnosis not present

## 2018-09-30 DIAGNOSIS — G894 Chronic pain syndrome: Secondary | ICD-10-CM | POA: Diagnosis not present

## 2018-09-30 DIAGNOSIS — K219 Gastro-esophageal reflux disease without esophagitis: Secondary | ICD-10-CM | POA: Diagnosis not present

## 2018-09-30 DIAGNOSIS — Z7951 Long term (current) use of inhaled steroids: Secondary | ICD-10-CM | POA: Diagnosis not present

## 2018-09-30 DIAGNOSIS — D649 Anemia, unspecified: Secondary | ICD-10-CM | POA: Diagnosis not present

## 2018-10-03 DIAGNOSIS — K219 Gastro-esophageal reflux disease without esophagitis: Secondary | ICD-10-CM | POA: Diagnosis not present

## 2018-10-03 DIAGNOSIS — Z794 Long term (current) use of insulin: Secondary | ICD-10-CM | POA: Diagnosis not present

## 2018-10-03 DIAGNOSIS — Z9181 History of falling: Secondary | ICD-10-CM | POA: Diagnosis not present

## 2018-10-03 DIAGNOSIS — Z87440 Personal history of urinary (tract) infections: Secondary | ICD-10-CM | POA: Diagnosis not present

## 2018-10-03 DIAGNOSIS — I5032 Chronic diastolic (congestive) heart failure: Secondary | ICD-10-CM | POA: Diagnosis not present

## 2018-10-03 DIAGNOSIS — E785 Hyperlipidemia, unspecified: Secondary | ICD-10-CM | POA: Diagnosis not present

## 2018-10-03 DIAGNOSIS — G894 Chronic pain syndrome: Secondary | ICD-10-CM | POA: Diagnosis not present

## 2018-10-03 DIAGNOSIS — I110 Hypertensive heart disease with heart failure: Secondary | ICD-10-CM | POA: Diagnosis not present

## 2018-10-03 DIAGNOSIS — J449 Chronic obstructive pulmonary disease, unspecified: Secondary | ICD-10-CM | POA: Diagnosis not present

## 2018-10-03 DIAGNOSIS — S72352D Displaced comminuted fracture of shaft of left femur, subsequent encounter for closed fracture with routine healing: Secondary | ICD-10-CM | POA: Diagnosis not present

## 2018-10-03 DIAGNOSIS — D649 Anemia, unspecified: Secondary | ICD-10-CM | POA: Diagnosis not present

## 2018-10-03 DIAGNOSIS — S72142D Displaced intertrochanteric fracture of left femur, subsequent encounter for closed fracture with routine healing: Secondary | ICD-10-CM | POA: Diagnosis not present

## 2018-10-03 DIAGNOSIS — Z7951 Long term (current) use of inhaled steroids: Secondary | ICD-10-CM | POA: Diagnosis not present

## 2018-10-03 DIAGNOSIS — E119 Type 2 diabetes mellitus without complications: Secondary | ICD-10-CM | POA: Diagnosis not present

## 2018-10-07 ENCOUNTER — Telehealth: Payer: Self-pay | Admitting: *Deleted

## 2018-10-07 DIAGNOSIS — S72142D Displaced intertrochanteric fracture of left femur, subsequent encounter for closed fracture with routine healing: Secondary | ICD-10-CM | POA: Diagnosis not present

## 2018-10-07 DIAGNOSIS — E119 Type 2 diabetes mellitus without complications: Secondary | ICD-10-CM | POA: Diagnosis not present

## 2018-10-07 DIAGNOSIS — K219 Gastro-esophageal reflux disease without esophagitis: Secondary | ICD-10-CM | POA: Diagnosis not present

## 2018-10-07 DIAGNOSIS — D649 Anemia, unspecified: Secondary | ICD-10-CM | POA: Diagnosis not present

## 2018-10-07 DIAGNOSIS — J449 Chronic obstructive pulmonary disease, unspecified: Secondary | ICD-10-CM | POA: Diagnosis not present

## 2018-10-07 DIAGNOSIS — Z794 Long term (current) use of insulin: Secondary | ICD-10-CM | POA: Diagnosis not present

## 2018-10-07 DIAGNOSIS — I5032 Chronic diastolic (congestive) heart failure: Secondary | ICD-10-CM | POA: Diagnosis not present

## 2018-10-07 DIAGNOSIS — I110 Hypertensive heart disease with heart failure: Secondary | ICD-10-CM | POA: Diagnosis not present

## 2018-10-07 DIAGNOSIS — G894 Chronic pain syndrome: Secondary | ICD-10-CM | POA: Diagnosis not present

## 2018-10-07 DIAGNOSIS — Z9181 History of falling: Secondary | ICD-10-CM | POA: Diagnosis not present

## 2018-10-07 DIAGNOSIS — S72352D Displaced comminuted fracture of shaft of left femur, subsequent encounter for closed fracture with routine healing: Secondary | ICD-10-CM | POA: Diagnosis not present

## 2018-10-07 DIAGNOSIS — Z7951 Long term (current) use of inhaled steroids: Secondary | ICD-10-CM | POA: Diagnosis not present

## 2018-10-07 DIAGNOSIS — Z87440 Personal history of urinary (tract) infections: Secondary | ICD-10-CM | POA: Diagnosis not present

## 2018-10-07 DIAGNOSIS — E785 Hyperlipidemia, unspecified: Secondary | ICD-10-CM | POA: Diagnosis not present

## 2018-10-07 NOTE — Telephone Encounter (Signed)
Returned call to [employer].  Her voicemail is full so I was unable to leave a message.  Will try again later.

## 2018-10-07 NOTE — Telephone Encounter (Signed)
Verbal order given to Lysbeth Galas, by telephone, for home health aid 2x/wk for 2 additional weeks.

## 2018-10-07 NOTE — Telephone Encounter (Signed)
Spoke to [employer] with [facility] who is requesting a home health aid 2x/wk for 2 additional weeks.

## 2018-10-09 DIAGNOSIS — Z9181 History of falling: Secondary | ICD-10-CM | POA: Diagnosis not present

## 2018-10-09 DIAGNOSIS — S72142D Displaced intertrochanteric fracture of left femur, subsequent encounter for closed fracture with routine healing: Secondary | ICD-10-CM | POA: Diagnosis not present

## 2018-10-09 DIAGNOSIS — E119 Type 2 diabetes mellitus without complications: Secondary | ICD-10-CM | POA: Diagnosis not present

## 2018-10-09 DIAGNOSIS — I110 Hypertensive heart disease with heart failure: Secondary | ICD-10-CM | POA: Diagnosis not present

## 2018-10-09 DIAGNOSIS — J449 Chronic obstructive pulmonary disease, unspecified: Secondary | ICD-10-CM | POA: Diagnosis not present

## 2018-10-09 DIAGNOSIS — I5032 Chronic diastolic (congestive) heart failure: Secondary | ICD-10-CM | POA: Diagnosis not present

## 2018-10-09 DIAGNOSIS — D649 Anemia, unspecified: Secondary | ICD-10-CM | POA: Diagnosis not present

## 2018-10-09 DIAGNOSIS — S72352D Displaced comminuted fracture of shaft of left femur, subsequent encounter for closed fracture with routine healing: Secondary | ICD-10-CM | POA: Diagnosis not present

## 2018-10-09 DIAGNOSIS — K219 Gastro-esophageal reflux disease without esophagitis: Secondary | ICD-10-CM | POA: Diagnosis not present

## 2018-10-09 DIAGNOSIS — E785 Hyperlipidemia, unspecified: Secondary | ICD-10-CM | POA: Diagnosis not present

## 2018-10-09 DIAGNOSIS — Z7951 Long term (current) use of inhaled steroids: Secondary | ICD-10-CM | POA: Diagnosis not present

## 2018-10-09 DIAGNOSIS — Z87440 Personal history of urinary (tract) infections: Secondary | ICD-10-CM | POA: Diagnosis not present

## 2018-10-09 DIAGNOSIS — Z794 Long term (current) use of insulin: Secondary | ICD-10-CM | POA: Diagnosis not present

## 2018-10-09 DIAGNOSIS — G894 Chronic pain syndrome: Secondary | ICD-10-CM | POA: Diagnosis not present

## 2018-10-13 ENCOUNTER — Other Ambulatory Visit: Payer: Self-pay | Admitting: Family Medicine

## 2018-10-13 NOTE — Telephone Encounter (Signed)
Last office visit 06/30/2018 for [place].  Last refilled 09/16/2018 for #90 with no refills.  Next Appt: 12/23/2018 for 6 month follow up.  UDS/Contract 12/03/2017.

## 2018-10-14 DIAGNOSIS — D649 Anemia, unspecified: Secondary | ICD-10-CM | POA: Diagnosis not present

## 2018-10-14 DIAGNOSIS — J449 Chronic obstructive pulmonary disease, unspecified: Secondary | ICD-10-CM | POA: Diagnosis not present

## 2018-10-14 DIAGNOSIS — S72352D Displaced comminuted fracture of shaft of left femur, subsequent encounter for closed fracture with routine healing: Secondary | ICD-10-CM | POA: Diagnosis not present

## 2018-10-14 DIAGNOSIS — Z9181 History of falling: Secondary | ICD-10-CM | POA: Diagnosis not present

## 2018-10-14 DIAGNOSIS — E785 Hyperlipidemia, unspecified: Secondary | ICD-10-CM | POA: Diagnosis not present

## 2018-10-14 DIAGNOSIS — Z794 Long term (current) use of insulin: Secondary | ICD-10-CM | POA: Diagnosis not present

## 2018-10-14 DIAGNOSIS — I5032 Chronic diastolic (congestive) heart failure: Secondary | ICD-10-CM | POA: Diagnosis not present

## 2018-10-14 DIAGNOSIS — K219 Gastro-esophageal reflux disease without esophagitis: Secondary | ICD-10-CM | POA: Diagnosis not present

## 2018-10-14 DIAGNOSIS — Z7951 Long term (current) use of inhaled steroids: Secondary | ICD-10-CM | POA: Diagnosis not present

## 2018-10-14 DIAGNOSIS — S72142D Displaced intertrochanteric fracture of left femur, subsequent encounter for closed fracture with routine healing: Secondary | ICD-10-CM | POA: Diagnosis not present

## 2018-10-14 DIAGNOSIS — I110 Hypertensive heart disease with heart failure: Secondary | ICD-10-CM | POA: Diagnosis not present

## 2018-10-14 DIAGNOSIS — G894 Chronic pain syndrome: Secondary | ICD-10-CM | POA: Diagnosis not present

## 2018-10-14 DIAGNOSIS — E119 Type 2 diabetes mellitus without complications: Secondary | ICD-10-CM | POA: Diagnosis not present

## 2018-10-14 DIAGNOSIS — Z87440 Personal history of urinary (tract) infections: Secondary | ICD-10-CM | POA: Diagnosis not present

## 2018-10-16 ENCOUNTER — Telehealth: Payer: Self-pay | Admitting: Family Medicine

## 2018-10-16 ENCOUNTER — Encounter: Payer: Self-pay | Admitting: Family Medicine

## 2018-10-16 ENCOUNTER — Telehealth: Payer: Self-pay | Admitting: *Deleted

## 2018-10-16 DIAGNOSIS — D649 Anemia, unspecified: Secondary | ICD-10-CM | POA: Diagnosis not present

## 2018-10-16 DIAGNOSIS — Z9181 History of falling: Secondary | ICD-10-CM | POA: Diagnosis not present

## 2018-10-16 DIAGNOSIS — Z794 Long term (current) use of insulin: Secondary | ICD-10-CM | POA: Diagnosis not present

## 2018-10-16 DIAGNOSIS — K219 Gastro-esophageal reflux disease without esophagitis: Secondary | ICD-10-CM | POA: Diagnosis not present

## 2018-10-16 DIAGNOSIS — G894 Chronic pain syndrome: Secondary | ICD-10-CM | POA: Diagnosis not present

## 2018-10-16 DIAGNOSIS — E785 Hyperlipidemia, unspecified: Secondary | ICD-10-CM | POA: Diagnosis not present

## 2018-10-16 DIAGNOSIS — I5032 Chronic diastolic (congestive) heart failure: Secondary | ICD-10-CM | POA: Diagnosis not present

## 2018-10-16 DIAGNOSIS — S72142D Displaced intertrochanteric fracture of left femur, subsequent encounter for closed fracture with routine healing: Secondary | ICD-10-CM | POA: Diagnosis not present

## 2018-10-16 DIAGNOSIS — Z7951 Long term (current) use of inhaled steroids: Secondary | ICD-10-CM | POA: Diagnosis not present

## 2018-10-16 DIAGNOSIS — N390 Urinary tract infection, site not specified: Secondary | ICD-10-CM | POA: Diagnosis not present

## 2018-10-16 DIAGNOSIS — I110 Hypertensive heart disease with heart failure: Secondary | ICD-10-CM | POA: Diagnosis not present

## 2018-10-16 DIAGNOSIS — S72352D Displaced comminuted fracture of shaft of left femur, subsequent encounter for closed fracture with routine healing: Secondary | ICD-10-CM | POA: Diagnosis not present

## 2018-10-16 DIAGNOSIS — E119 Type 2 diabetes mellitus without complications: Secondary | ICD-10-CM | POA: Diagnosis not present

## 2018-10-16 DIAGNOSIS — J449 Chronic obstructive pulmonary disease, unspecified: Secondary | ICD-10-CM | POA: Diagnosis not present

## 2018-10-16 DIAGNOSIS — Z87440 Personal history of urinary (tract) infections: Secondary | ICD-10-CM | POA: Diagnosis not present

## 2018-10-16 NOTE — Telephone Encounter (Signed)
Left message for Cecille Rubin giving verbal orders for UA, micro and culture.  Orders also given to use I and O cath if needed.

## 2018-10-16 NOTE — Telephone Encounter (Signed)
This call is from the [facility] Nurse letting Dr. [name] know she had collected the urine sample and that it was bright red.  Will await UA/Micro & Culture results.

## 2018-10-16 NOTE — Telephone Encounter (Signed)
Stated pt's urine is bright red and she has the sample. Sample hasn't been sent to lab. Just an [place]

## 2018-10-16 NOTE — Telephone Encounter (Signed)
Okay to give verbal order as requested for UA, micro and culture, to use I and O cath if needed.

## 2018-10-16 NOTE — Telephone Encounter (Signed)
[facility] was given verbal orders to process. Pt needs to refrigerate or get new sample when the get by to pick it up.

## 2018-10-16 NOTE — Telephone Encounter (Signed)
Spoke to [place] with [facility] who states pt contacted her and advised she had dysuria and blood in her depends. Cecille Rubin is requesting a verbal order for urinalysis with culture and she states she may have to use a catheter, if that could be included in the order as well. pls advise

## 2018-10-17 DIAGNOSIS — D649 Anemia, unspecified: Secondary | ICD-10-CM | POA: Diagnosis not present

## 2018-10-17 DIAGNOSIS — Z87440 Personal history of urinary (tract) infections: Secondary | ICD-10-CM | POA: Diagnosis not present

## 2018-10-17 DIAGNOSIS — Z7951 Long term (current) use of inhaled steroids: Secondary | ICD-10-CM | POA: Diagnosis not present

## 2018-10-17 DIAGNOSIS — E119 Type 2 diabetes mellitus without complications: Secondary | ICD-10-CM | POA: Diagnosis not present

## 2018-10-17 DIAGNOSIS — E785 Hyperlipidemia, unspecified: Secondary | ICD-10-CM | POA: Diagnosis not present

## 2018-10-17 DIAGNOSIS — K219 Gastro-esophageal reflux disease without esophagitis: Secondary | ICD-10-CM | POA: Diagnosis not present

## 2018-10-17 DIAGNOSIS — I110 Hypertensive heart disease with heart failure: Secondary | ICD-10-CM | POA: Diagnosis not present

## 2018-10-17 DIAGNOSIS — I5032 Chronic diastolic (congestive) heart failure: Secondary | ICD-10-CM | POA: Diagnosis not present

## 2018-10-17 DIAGNOSIS — S72142D Displaced intertrochanteric fracture of left femur, subsequent encounter for closed fracture with routine healing: Secondary | ICD-10-CM | POA: Diagnosis not present

## 2018-10-17 DIAGNOSIS — G894 Chronic pain syndrome: Secondary | ICD-10-CM | POA: Diagnosis not present

## 2018-10-17 DIAGNOSIS — S72352D Displaced comminuted fracture of shaft of left femur, subsequent encounter for closed fracture with routine healing: Secondary | ICD-10-CM | POA: Diagnosis not present

## 2018-10-17 DIAGNOSIS — J449 Chronic obstructive pulmonary disease, unspecified: Secondary | ICD-10-CM | POA: Diagnosis not present

## 2018-10-17 DIAGNOSIS — Z9181 History of falling: Secondary | ICD-10-CM | POA: Diagnosis not present

## 2018-10-17 DIAGNOSIS — Z794 Long term (current) use of insulin: Secondary | ICD-10-CM | POA: Diagnosis not present

## 2018-10-17 MED ORDER — SULFAMETHOXAZOLE-TRIMETHOPRIM 800-160 MG PO TABS: 1.0000 | ORAL_TABLET | Freq: Two times a day (BID) | ORAL | 0 refills | Status: DC

## 2018-10-17 NOTE — Telephone Encounter (Signed)
Spoke with Chrys Racer.  She states she does have dysuria as well as urgency & frequency.  She denies any fever or low back tenderness.  She states she mostly hurts in her lower abdomen.  I have left a message for Cecille Rubin with [facility] asking for UA and microscopic results be faxed to [phone].

## 2018-10-17 NOTE — Telephone Encounter (Signed)
Pt has called and is not wanting to wait over the weekend to get an antibiotic for the hematuria she is having. Asking that something be called in to [facility] Please advise at [phone].

## 2018-10-17 NOTE — Telephone Encounter (Signed)
Please call [place] to get UA and micro results at least... should be back already. Culture may take longer.  Given blood and dysuria... we can start her on an antibiotic.   Please find out symptoms... dysuria?, freq? Urgency? Fever? CVA back tenderness? Low abd pain? I know nausea and blood in urine.  Let me know and I will make antibiotic choice or other recommendations.

## 2018-10-17 NOTE — Telephone Encounter (Signed)
Pt called back saying she is nausous and sick and wants to make sure something will be taken care of today.

## 2018-10-17 NOTE — Addendum Note (Signed)
Addended by: Eliezer Lofts E on: 10/17/2018 04:53 PM   Modules accepted: Orders

## 2018-10-20 DIAGNOSIS — S72352D Displaced comminuted fracture of shaft of left femur, subsequent encounter for closed fracture with routine healing: Secondary | ICD-10-CM | POA: Diagnosis not present

## 2018-10-20 DIAGNOSIS — S72142D Displaced intertrochanteric fracture of left femur, subsequent encounter for closed fracture with routine healing: Secondary | ICD-10-CM | POA: Diagnosis not present

## 2018-10-20 DIAGNOSIS — Z794 Long term (current) use of insulin: Secondary | ICD-10-CM | POA: Diagnosis not present

## 2018-10-20 DIAGNOSIS — K219 Gastro-esophageal reflux disease without esophagitis: Secondary | ICD-10-CM | POA: Diagnosis not present

## 2018-10-20 DIAGNOSIS — Z7951 Long term (current) use of inhaled steroids: Secondary | ICD-10-CM | POA: Diagnosis not present

## 2018-10-20 DIAGNOSIS — G894 Chronic pain syndrome: Secondary | ICD-10-CM | POA: Diagnosis not present

## 2018-10-20 DIAGNOSIS — E119 Type 2 diabetes mellitus without complications: Secondary | ICD-10-CM | POA: Diagnosis not present

## 2018-10-20 DIAGNOSIS — D649 Anemia, unspecified: Secondary | ICD-10-CM | POA: Diagnosis not present

## 2018-10-20 DIAGNOSIS — E785 Hyperlipidemia, unspecified: Secondary | ICD-10-CM | POA: Diagnosis not present

## 2018-10-20 DIAGNOSIS — I110 Hypertensive heart disease with heart failure: Secondary | ICD-10-CM | POA: Diagnosis not present

## 2018-10-20 DIAGNOSIS — I5032 Chronic diastolic (congestive) heart failure: Secondary | ICD-10-CM | POA: Diagnosis not present

## 2018-10-20 DIAGNOSIS — J449 Chronic obstructive pulmonary disease, unspecified: Secondary | ICD-10-CM | POA: Diagnosis not present

## 2018-10-20 DIAGNOSIS — Z9181 History of falling: Secondary | ICD-10-CM | POA: Diagnosis not present

## 2018-10-20 DIAGNOSIS — Z87440 Personal history of urinary (tract) infections: Secondary | ICD-10-CM | POA: Diagnosis not present

## 2018-10-22 DIAGNOSIS — S72352D Displaced comminuted fracture of shaft of left femur, subsequent encounter for closed fracture with routine healing: Secondary | ICD-10-CM | POA: Diagnosis not present

## 2018-10-22 DIAGNOSIS — S72142D Displaced intertrochanteric fracture of left femur, subsequent encounter for closed fracture with routine healing: Secondary | ICD-10-CM | POA: Diagnosis not present

## 2018-10-22 DIAGNOSIS — E785 Hyperlipidemia, unspecified: Secondary | ICD-10-CM | POA: Diagnosis not present

## 2018-10-22 DIAGNOSIS — E119 Type 2 diabetes mellitus without complications: Secondary | ICD-10-CM | POA: Diagnosis not present

## 2018-10-22 DIAGNOSIS — G894 Chronic pain syndrome: Secondary | ICD-10-CM | POA: Diagnosis not present

## 2018-10-22 DIAGNOSIS — Z9181 History of falling: Secondary | ICD-10-CM | POA: Diagnosis not present

## 2018-10-22 DIAGNOSIS — D649 Anemia, unspecified: Secondary | ICD-10-CM | POA: Diagnosis not present

## 2018-10-22 DIAGNOSIS — K219 Gastro-esophageal reflux disease without esophagitis: Secondary | ICD-10-CM | POA: Diagnosis not present

## 2018-10-22 DIAGNOSIS — Z794 Long term (current) use of insulin: Secondary | ICD-10-CM | POA: Diagnosis not present

## 2018-10-22 DIAGNOSIS — Z87440 Personal history of urinary (tract) infections: Secondary | ICD-10-CM | POA: Diagnosis not present

## 2018-10-22 DIAGNOSIS — I5032 Chronic diastolic (congestive) heart failure: Secondary | ICD-10-CM | POA: Diagnosis not present

## 2018-10-22 DIAGNOSIS — J449 Chronic obstructive pulmonary disease, unspecified: Secondary | ICD-10-CM | POA: Diagnosis not present

## 2018-10-22 DIAGNOSIS — Z7951 Long term (current) use of inhaled steroids: Secondary | ICD-10-CM | POA: Diagnosis not present

## 2018-10-22 DIAGNOSIS — I110 Hypertensive heart disease with heart failure: Secondary | ICD-10-CM | POA: Diagnosis not present

## 2018-10-23 DIAGNOSIS — Z87440 Personal history of urinary (tract) infections: Secondary | ICD-10-CM | POA: Diagnosis not present

## 2018-10-23 DIAGNOSIS — G894 Chronic pain syndrome: Secondary | ICD-10-CM | POA: Diagnosis not present

## 2018-10-23 DIAGNOSIS — I5032 Chronic diastolic (congestive) heart failure: Secondary | ICD-10-CM | POA: Diagnosis not present

## 2018-10-23 DIAGNOSIS — Z9181 History of falling: Secondary | ICD-10-CM | POA: Diagnosis not present

## 2018-10-23 DIAGNOSIS — J449 Chronic obstructive pulmonary disease, unspecified: Secondary | ICD-10-CM | POA: Diagnosis not present

## 2018-10-23 DIAGNOSIS — I110 Hypertensive heart disease with heart failure: Secondary | ICD-10-CM | POA: Diagnosis not present

## 2018-10-23 DIAGNOSIS — Z7951 Long term (current) use of inhaled steroids: Secondary | ICD-10-CM | POA: Diagnosis not present

## 2018-10-23 DIAGNOSIS — S72352D Displaced comminuted fracture of shaft of left femur, subsequent encounter for closed fracture with routine healing: Secondary | ICD-10-CM | POA: Diagnosis not present

## 2018-10-23 DIAGNOSIS — S72142D Displaced intertrochanteric fracture of left femur, subsequent encounter for closed fracture with routine healing: Secondary | ICD-10-CM | POA: Diagnosis not present

## 2018-10-23 DIAGNOSIS — Z794 Long term (current) use of insulin: Secondary | ICD-10-CM | POA: Diagnosis not present

## 2018-10-23 DIAGNOSIS — D649 Anemia, unspecified: Secondary | ICD-10-CM | POA: Diagnosis not present

## 2018-10-23 DIAGNOSIS — S72492D Other fracture of lower end of left femur, subsequent encounter for closed fracture with routine healing: Secondary | ICD-10-CM | POA: Diagnosis not present

## 2018-10-23 DIAGNOSIS — E785 Hyperlipidemia, unspecified: Secondary | ICD-10-CM | POA: Diagnosis not present

## 2018-10-23 DIAGNOSIS — E119 Type 2 diabetes mellitus without complications: Secondary | ICD-10-CM | POA: Diagnosis not present

## 2018-10-23 DIAGNOSIS — K219 Gastro-esophageal reflux disease without esophagitis: Secondary | ICD-10-CM | POA: Diagnosis not present

## 2018-10-24 DIAGNOSIS — Z87440 Personal history of urinary (tract) infections: Secondary | ICD-10-CM | POA: Diagnosis not present

## 2018-10-24 DIAGNOSIS — I110 Hypertensive heart disease with heart failure: Secondary | ICD-10-CM | POA: Diagnosis not present

## 2018-10-24 DIAGNOSIS — K219 Gastro-esophageal reflux disease without esophagitis: Secondary | ICD-10-CM | POA: Diagnosis not present

## 2018-10-24 DIAGNOSIS — Z7951 Long term (current) use of inhaled steroids: Secondary | ICD-10-CM | POA: Diagnosis not present

## 2018-10-24 DIAGNOSIS — S72142D Displaced intertrochanteric fracture of left femur, subsequent encounter for closed fracture with routine healing: Secondary | ICD-10-CM | POA: Diagnosis not present

## 2018-10-24 DIAGNOSIS — E119 Type 2 diabetes mellitus without complications: Secondary | ICD-10-CM | POA: Diagnosis not present

## 2018-10-24 DIAGNOSIS — J449 Chronic obstructive pulmonary disease, unspecified: Secondary | ICD-10-CM | POA: Diagnosis not present

## 2018-10-24 DIAGNOSIS — I5032 Chronic diastolic (congestive) heart failure: Secondary | ICD-10-CM | POA: Diagnosis not present

## 2018-10-24 DIAGNOSIS — D649 Anemia, unspecified: Secondary | ICD-10-CM | POA: Diagnosis not present

## 2018-10-24 DIAGNOSIS — S72352D Displaced comminuted fracture of shaft of left femur, subsequent encounter for closed fracture with routine healing: Secondary | ICD-10-CM | POA: Diagnosis not present

## 2018-10-24 DIAGNOSIS — Z794 Long term (current) use of insulin: Secondary | ICD-10-CM | POA: Diagnosis not present

## 2018-10-24 DIAGNOSIS — E785 Hyperlipidemia, unspecified: Secondary | ICD-10-CM | POA: Diagnosis not present

## 2018-10-24 DIAGNOSIS — G894 Chronic pain syndrome: Secondary | ICD-10-CM | POA: Diagnosis not present

## 2018-10-24 DIAGNOSIS — Z9181 History of falling: Secondary | ICD-10-CM | POA: Diagnosis not present

## 2018-10-25 ENCOUNTER — Other Ambulatory Visit: Payer: Self-pay | Admitting: Family Medicine

## 2018-10-25 DIAGNOSIS — S72492S Other fracture of lower end of left femur, sequela: Secondary | ICD-10-CM | POA: Diagnosis not present

## 2018-10-25 DIAGNOSIS — S72142D Displaced intertrochanteric fracture of left femur, subsequent encounter for closed fracture with routine healing: Secondary | ICD-10-CM | POA: Diagnosis not present

## 2018-10-27 DIAGNOSIS — E785 Hyperlipidemia, unspecified: Secondary | ICD-10-CM | POA: Diagnosis not present

## 2018-10-27 DIAGNOSIS — Z794 Long term (current) use of insulin: Secondary | ICD-10-CM | POA: Diagnosis not present

## 2018-10-27 DIAGNOSIS — S72352D Displaced comminuted fracture of shaft of left femur, subsequent encounter for closed fracture with routine healing: Secondary | ICD-10-CM | POA: Diagnosis not present

## 2018-10-27 DIAGNOSIS — G894 Chronic pain syndrome: Secondary | ICD-10-CM | POA: Diagnosis not present

## 2018-10-27 DIAGNOSIS — I110 Hypertensive heart disease with heart failure: Secondary | ICD-10-CM | POA: Diagnosis not present

## 2018-10-27 DIAGNOSIS — Z7951 Long term (current) use of inhaled steroids: Secondary | ICD-10-CM | POA: Diagnosis not present

## 2018-10-27 DIAGNOSIS — S72142D Displaced intertrochanteric fracture of left femur, subsequent encounter for closed fracture with routine healing: Secondary | ICD-10-CM | POA: Diagnosis not present

## 2018-10-27 DIAGNOSIS — D649 Anemia, unspecified: Secondary | ICD-10-CM | POA: Diagnosis not present

## 2018-10-27 DIAGNOSIS — J449 Chronic obstructive pulmonary disease, unspecified: Secondary | ICD-10-CM | POA: Diagnosis not present

## 2018-10-27 DIAGNOSIS — I5032 Chronic diastolic (congestive) heart failure: Secondary | ICD-10-CM | POA: Diagnosis not present

## 2018-10-27 DIAGNOSIS — Z9181 History of falling: Secondary | ICD-10-CM | POA: Diagnosis not present

## 2018-10-27 DIAGNOSIS — K219 Gastro-esophageal reflux disease without esophagitis: Secondary | ICD-10-CM | POA: Diagnosis not present

## 2018-10-27 DIAGNOSIS — Z87440 Personal history of urinary (tract) infections: Secondary | ICD-10-CM | POA: Diagnosis not present

## 2018-10-27 DIAGNOSIS — E119 Type 2 diabetes mellitus without complications: Secondary | ICD-10-CM | POA: Diagnosis not present

## 2018-11-04 DIAGNOSIS — M25662 Stiffness of left knee, not elsewhere classified: Secondary | ICD-10-CM | POA: Diagnosis not present

## 2018-11-04 DIAGNOSIS — M6281 Muscle weakness (generalized): Secondary | ICD-10-CM | POA: Diagnosis not present

## 2018-11-04 DIAGNOSIS — R262 Difficulty in walking, not elsewhere classified: Secondary | ICD-10-CM | POA: Diagnosis not present

## 2018-11-04 DIAGNOSIS — S72355D Nondisplaced comminuted fracture of shaft of left femur, subsequent encounter for closed fracture with routine healing: Secondary | ICD-10-CM | POA: Diagnosis not present

## 2018-11-07 ENCOUNTER — Other Ambulatory Visit: Payer: Self-pay | Admitting: Family Medicine

## 2018-11-07 NOTE — Telephone Encounter (Signed)
Last office visit 06/30/2018 for [place].  Last refilled 10/14/2018 for #90 with no refills.  Next Appt: 12/23/2018 for 6 month follow up.  UDS/Contract 12/03/2017.

## 2018-11-13 DIAGNOSIS — R262 Difficulty in walking, not elsewhere classified: Secondary | ICD-10-CM | POA: Diagnosis not present

## 2018-11-13 DIAGNOSIS — M25662 Stiffness of left knee, not elsewhere classified: Secondary | ICD-10-CM | POA: Diagnosis not present

## 2018-11-13 DIAGNOSIS — S72355D Nondisplaced comminuted fracture of shaft of left femur, subsequent encounter for closed fracture with routine healing: Secondary | ICD-10-CM | POA: Diagnosis not present

## 2018-11-13 DIAGNOSIS — M6281 Muscle weakness (generalized): Secondary | ICD-10-CM | POA: Diagnosis not present

## 2018-11-20 DIAGNOSIS — R262 Difficulty in walking, not elsewhere classified: Secondary | ICD-10-CM | POA: Diagnosis not present

## 2018-11-20 DIAGNOSIS — M25662 Stiffness of left knee, not elsewhere classified: Secondary | ICD-10-CM | POA: Diagnosis not present

## 2018-11-20 DIAGNOSIS — S72355D Nondisplaced comminuted fracture of shaft of left femur, subsequent encounter for closed fracture with routine healing: Secondary | ICD-10-CM | POA: Diagnosis not present

## 2018-11-20 DIAGNOSIS — M6281 Muscle weakness (generalized): Secondary | ICD-10-CM | POA: Diagnosis not present

## 2018-11-24 ENCOUNTER — Other Ambulatory Visit: Payer: Self-pay | Admitting: Family Medicine

## 2018-11-25 DIAGNOSIS — S72142D Displaced intertrochanteric fracture of left femur, subsequent encounter for closed fracture with routine healing: Secondary | ICD-10-CM | POA: Diagnosis not present

## 2018-11-25 DIAGNOSIS — S72492S Other fracture of lower end of left femur, sequela: Secondary | ICD-10-CM | POA: Diagnosis not present

## 2018-12-01 ENCOUNTER — Other Ambulatory Visit: Payer: Self-pay | Admitting: Family Medicine

## 2018-12-09 ENCOUNTER — Other Ambulatory Visit: Payer: Self-pay | Admitting: Family Medicine

## 2018-12-09 NOTE — Telephone Encounter (Signed)
Last office visit 06/20/2018 for [place].  Last refilled 11/07/2018 for #90 with no refills.  Next Appt: 12/23/2018.

## 2018-12-10 NOTE — Telephone Encounter (Signed)
Patient calls in to verify that prescription has been requested and to check status of alprazolam refill.    Patient states she is low on medication but not out.  I did inform her that we require 48-72 hours notice to process refills and r/x has been sent to Dr. [name] for approval.  Should expect response in the next 24-48 hours.  Patient verbalizes understanding.

## 2018-12-13 ENCOUNTER — Other Ambulatory Visit: Payer: Self-pay | Admitting: Family Medicine

## 2018-12-16 ENCOUNTER — Telehealth: Payer: Self-pay | Admitting: Family Medicine

## 2018-12-16 ENCOUNTER — Other Ambulatory Visit: Payer: Medicare Other

## 2018-12-16 DIAGNOSIS — E119 Type 2 diabetes mellitus without complications: Secondary | ICD-10-CM

## 2018-12-16 NOTE — Telephone Encounter (Signed)
-----   Message from Ellamae Sia sent at 12/08/2018  2:41 PM EST ----- Regarding: Lab orders for Tuesday, 2.11.20 Lab orders for a f/u

## 2018-12-23 ENCOUNTER — Ambulatory Visit: Payer: Medicare Other | Admitting: Family Medicine

## 2018-12-23 DIAGNOSIS — M25662 Stiffness of left knee, not elsewhere classified: Secondary | ICD-10-CM | POA: Diagnosis not present

## 2018-12-24 ENCOUNTER — Other Ambulatory Visit: Payer: Self-pay

## 2018-12-24 ENCOUNTER — Emergency Department (HOSPITAL_COMMUNITY): Payer: Medicare Other

## 2018-12-24 ENCOUNTER — Inpatient Hospital Stay (HOSPITAL_COMMUNITY)
Admission: EM | Admit: 2018-12-24 | Discharge: 2018-12-27 | DRG: 872 | Disposition: A | Discharge status: Home Health | Payer: Medicare Other | Attending: Internal Medicine | Admitting: Internal Medicine

## 2018-12-24 ENCOUNTER — Encounter (HOSPITAL_COMMUNITY): Payer: Self-pay | Admitting: *Deleted

## 2018-12-24 DIAGNOSIS — Z6841 Body Mass Index (BMI) 40.0 and over, adult: Secondary | ICD-10-CM

## 2018-12-24 DIAGNOSIS — Z9049 Acquired absence of other specified parts of digestive tract: Secondary | ICD-10-CM | POA: Diagnosis not present

## 2018-12-24 DIAGNOSIS — S7292XA Unspecified fracture of left femur, initial encounter for closed fracture: Secondary | ICD-10-CM

## 2018-12-24 DIAGNOSIS — Z7951 Long term (current) use of inhaled steroids: Secondary | ICD-10-CM

## 2018-12-24 DIAGNOSIS — E1169 Type 2 diabetes mellitus with other specified complication: Secondary | ICD-10-CM | POA: Diagnosis present

## 2018-12-24 DIAGNOSIS — N12 Tubulo-interstitial nephritis, not specified as acute or chronic: Secondary | ICD-10-CM | POA: Diagnosis not present

## 2018-12-24 DIAGNOSIS — I110 Hypertensive heart disease with heart failure: Secondary | ICD-10-CM | POA: Diagnosis present

## 2018-12-24 DIAGNOSIS — F411 Generalized anxiety disorder: Secondary | ICD-10-CM | POA: Diagnosis present

## 2018-12-24 DIAGNOSIS — I959 Hypotension, unspecified: Secondary | ICD-10-CM | POA: Diagnosis not present

## 2018-12-24 DIAGNOSIS — I10 Essential (primary) hypertension: Secondary | ICD-10-CM

## 2018-12-24 DIAGNOSIS — A419 Sepsis, unspecified organism: Principal | ICD-10-CM | POA: Diagnosis present

## 2018-12-24 DIAGNOSIS — Z794 Long term (current) use of insulin: Secondary | ICD-10-CM

## 2018-12-24 DIAGNOSIS — K219 Gastro-esophageal reflux disease without esophagitis: Secondary | ICD-10-CM | POA: Diagnosis present

## 2018-12-24 DIAGNOSIS — M797 Fibromyalgia: Secondary | ICD-10-CM | POA: Diagnosis not present

## 2018-12-24 DIAGNOSIS — S79929A Unspecified injury of unspecified thigh, initial encounter: Secondary | ICD-10-CM | POA: Diagnosis not present

## 2018-12-24 DIAGNOSIS — I5032 Chronic diastolic (congestive) heart failure: Secondary | ICD-10-CM | POA: Diagnosis not present

## 2018-12-24 DIAGNOSIS — N10 Acute tubulo-interstitial nephritis: Secondary | ICD-10-CM | POA: Diagnosis present

## 2018-12-24 DIAGNOSIS — E785 Hyperlipidemia, unspecified: Secondary | ICD-10-CM | POA: Diagnosis not present

## 2018-12-24 DIAGNOSIS — F17210 Nicotine dependence, cigarettes, uncomplicated: Secondary | ICD-10-CM | POA: Diagnosis present

## 2018-12-24 DIAGNOSIS — M2550 Pain in unspecified joint: Secondary | ICD-10-CM | POA: Diagnosis not present

## 2018-12-24 DIAGNOSIS — E861 Hypovolemia: Secondary | ICD-10-CM | POA: Diagnosis present

## 2018-12-24 DIAGNOSIS — R918 Other nonspecific abnormal finding of lung field: Secondary | ICD-10-CM | POA: Diagnosis not present

## 2018-12-24 DIAGNOSIS — R5381 Other malaise: Secondary | ICD-10-CM | POA: Diagnosis not present

## 2018-12-24 DIAGNOSIS — R509 Fever, unspecified: Secondary | ICD-10-CM | POA: Diagnosis not present

## 2018-12-24 DIAGNOSIS — R627 Adult failure to thrive: Secondary | ICD-10-CM | POA: Diagnosis not present

## 2018-12-24 DIAGNOSIS — E1159 Type 2 diabetes mellitus with other circulatory complications: Secondary | ICD-10-CM | POA: Diagnosis present

## 2018-12-24 DIAGNOSIS — S72142D Displaced intertrochanteric fracture of left femur, subsequent encounter for closed fracture with routine healing: Secondary | ICD-10-CM | POA: Diagnosis not present

## 2018-12-24 DIAGNOSIS — R109 Unspecified abdominal pain: Secondary | ICD-10-CM | POA: Diagnosis not present

## 2018-12-24 DIAGNOSIS — J449 Chronic obstructive pulmonary disease, unspecified: Secondary | ICD-10-CM | POA: Diagnosis not present

## 2018-12-24 DIAGNOSIS — Z880 Allergy status to penicillin: Secondary | ICD-10-CM

## 2018-12-24 DIAGNOSIS — G894 Chronic pain syndrome: Secondary | ICD-10-CM | POA: Diagnosis not present

## 2018-12-24 DIAGNOSIS — Z981 Arthrodesis status: Secondary | ICD-10-CM | POA: Diagnosis not present

## 2018-12-24 DIAGNOSIS — Z7401 Bed confinement status: Secondary | ICD-10-CM | POA: Diagnosis not present

## 2018-12-24 DIAGNOSIS — B961 Klebsiella pneumoniae [K. pneumoniae] as the cause of diseases classified elsewhere: Secondary | ICD-10-CM | POA: Diagnosis present

## 2018-12-24 DIAGNOSIS — G4730 Sleep apnea, unspecified: Secondary | ICD-10-CM | POA: Diagnosis not present

## 2018-12-24 DIAGNOSIS — Z79899 Other long term (current) drug therapy: Secondary | ICD-10-CM

## 2018-12-24 DIAGNOSIS — E119 Type 2 diabetes mellitus without complications: Secondary | ICD-10-CM | POA: Diagnosis present

## 2018-12-24 DIAGNOSIS — E876 Hypokalemia: Secondary | ICD-10-CM | POA: Diagnosis present

## 2018-12-24 DIAGNOSIS — S72492D Other fracture of lower end of left femur, subsequent encounter for closed fracture with routine healing: Secondary | ICD-10-CM | POA: Diagnosis not present

## 2018-12-24 DIAGNOSIS — E871 Hypo-osmolality and hyponatremia: Secondary | ICD-10-CM | POA: Diagnosis not present

## 2018-12-24 DIAGNOSIS — D649 Anemia, unspecified: Secondary | ICD-10-CM | POA: Diagnosis present

## 2018-12-24 DIAGNOSIS — R000 Tachycardia, unspecified: Secondary | ICD-10-CM | POA: Diagnosis not present

## 2018-12-24 DIAGNOSIS — E1165 Type 2 diabetes mellitus with hyperglycemia: Secondary | ICD-10-CM | POA: Diagnosis not present

## 2018-12-24 DIAGNOSIS — S72492K Other fracture of lower end of left femur, subsequent encounter for closed fracture with nonunion: Secondary | ICD-10-CM | POA: Diagnosis not present

## 2018-12-24 DIAGNOSIS — R0902 Hypoxemia: Secondary | ICD-10-CM | POA: Diagnosis not present

## 2018-12-24 DIAGNOSIS — S72492S Other fracture of lower end of left femur, sequela: Secondary | ICD-10-CM | POA: Diagnosis not present

## 2018-12-24 LAB — URINALYSIS, ROUTINE W REFLEX MICROSCOPIC
Bilirubin Urine: NEGATIVE
GLUCOSE, UA: NEGATIVE mg/dL
KETONES UR: NEGATIVE mg/dL
Nitrite: NEGATIVE
PH: 5 (ref 5.0–8.0)
PROTEIN: 100 mg/dL — AB
SPECIFIC GRAVITY, URINE: 1.019 (ref 1.005–1.030)
WBC, UA: >50 WBC/hpf — ABNORMAL HIGH (ref 0–5)

## 2018-12-24 LAB — CBC WITH DIFFERENTIAL/PLATELET
Abs Immature Granulocytes: 0.15 10*3/uL — ABNORMAL HIGH (ref 0.00–0.07)
BASOS ABS: 0 10*3/uL (ref 0.0–0.1)
Basophils Relative: 0 %
Eosinophils Absolute: 0 10*3/uL (ref 0.0–0.5)
Eosinophils Relative: 0 %
HEMATOCRIT: 31.3 % — AB (ref 36.0–46.0)
HEMOGLOBIN: 9.4 g/dL — AB (ref 12.0–15.0)
IMMATURE GRANULOCYTES: 1 %
Lymphocytes Relative: 10 %
Lymphs Abs: 2 10*3/uL (ref 0.7–4.0)
MCH: 27.2 pg (ref 26.0–34.0)
MCHC: 30 g/dL (ref 30.0–36.0)
MCV: 90.7 fL (ref 80.0–100.0)
Monocytes Absolute: 2.1 10*3/uL — ABNORMAL HIGH (ref 0.1–1.0)
Monocytes Relative: 11 %
NEUTROS ABS: 15.8 10*3/uL — AB (ref 1.7–7.7)
NEUTROS PCT: 78 %
NRBC: 0 % (ref 0.0–0.2)
Platelets: 392 10*3/uL (ref 150–400)
RBC: 3.45 MIL/uL — AB (ref 3.87–5.11)
RDW: 14.5 % (ref 11.5–15.5)
WBC: 20.1 10*3/uL — AB (ref 4.0–10.5)

## 2018-12-24 LAB — COMPREHENSIVE METABOLIC PANEL
ALBUMIN: 2.4 g/dL — AB (ref 3.5–5.0)
ALT: 7 U/L (ref 0–44)
ANION GAP: 9 (ref 5–15)
AST: 8 U/L — ABNORMAL LOW (ref 15–41)
Alkaline Phosphatase: 81 U/L (ref 38–126)
BUN: 13 mg/dL (ref 6–20)
CHLORIDE: 97 mmol/L — AB (ref 98–111)
CO2: 23 mmol/L (ref 22–32)
Calcium: 7.6 mg/dL — ABNORMAL LOW (ref 8.9–10.3)
Creatinine, Ser: 0.77 mg/dL (ref 0.44–1.00)
GFR calc non Af Amer: >60 mL/min (ref >60)
Glucose, Bld: 243 mg/dL — ABNORMAL HIGH (ref 70–99)
POTASSIUM: 3.3 mmol/L — AB (ref 3.5–5.1)
SODIUM: 129 mmol/L — AB (ref 135–145)
Total Bilirubin: 0.5 mg/dL (ref 0.3–1.2)
Total Protein: 7.4 g/dL (ref 6.5–8.1)

## 2018-12-24 LAB — PROTIME-INR
INR: 1.26
PROTHROMBIN TIME: 15.7 s — AB (ref 11.4–15.2)

## 2018-12-24 LAB — I-STAT BETA HCG BLOOD, ED (MC, WL, AP ONLY)

## 2018-12-24 LAB — CREATININE, URINE, RANDOM: Creatinine, Urine: 171.74 mg/dL

## 2018-12-24 LAB — SODIUM, URINE, RANDOM: Sodium, Ur: 35 mmol/L

## 2018-12-24 LAB — LACTIC ACID, PLASMA: Lactic Acid, Venous: 0.7 mmol/L (ref 0.5–1.9)

## 2018-12-24 MED ORDER — IOPAMIDOL (ISOVUE-300) INJECTION 61%
100.0000 mL | Freq: Once | INTRAVENOUS | Status: AC | PRN
  Administered 2018-12-24: 100 mL via INTRAVENOUS

## 2018-12-24 MED ORDER — LEVOFLOXACIN IN D5W 750 MG/150ML IV SOLN
750.0000 mg | Freq: Once | INTRAVENOUS | Status: AC
  Administered 2018-12-24: 750 mg via INTRAVENOUS
  Filled 2018-12-24: qty 150

## 2018-12-24 MED ORDER — MORPHINE SULFATE (PF) 4 MG/ML IV SOLN
4.0000 mg | Freq: Once | INTRAVENOUS | Status: AC
  Administered 2018-12-24: 4 mg via INTRAVENOUS
  Filled 2018-12-24: qty 1

## 2018-12-24 MED ORDER — SODIUM CHLORIDE 0.9 % IV BOLUS
1000.0000 mL | Freq: Once | INTRAVENOUS | Status: AC
  Administered 2018-12-24: 1000 mL via INTRAVENOUS

## 2018-12-24 MED ORDER — DIPHENHYDRAMINE HCL 50 MG/ML IJ SOLN
25.0000 mg | Freq: Once | INTRAMUSCULAR | Status: AC
  Administered 2018-12-24: 25 mg via INTRAVENOUS
  Filled 2018-12-24: qty 1

## 2018-12-24 MED ORDER — SODIUM CHLORIDE 0.9% FLUSH
3.0000 mL | Freq: Once | INTRAVENOUS | Status: AC
  Administered 2018-12-24: 3 mL via INTRAVENOUS

## 2018-12-24 MED ORDER — ACETAMINOPHEN 325 MG PO TABS
650.0000 mg | ORAL_TABLET | Freq: Once | ORAL | Status: AC
  Administered 2018-12-24: 650 mg via ORAL
  Filled 2018-12-24: qty 2

## 2018-12-24 NOTE — Progress Notes (Signed)
A consult was received from an ED physician for [place] per pharmacy dosing.  The patient's profile has been reviewed for ht/wt/allergies/indication/available labs.    A one time order has been placed for Levaquin 750mg .  Further antibiotics/pharmacy consults should be ordered by admitting physician if indicated.                       Thank you, Minda Ditto 12/24/2018  9:08 PM

## 2018-12-24 NOTE — ED Notes (Signed)
Bed: WA21 Expected date:  Expected time:  Means of arrival:  Comments: FTT

## 2018-12-24 NOTE — ED Triage Notes (Signed)
Per EMS - Family advised she had a femur fx in August. She has decreased mobility and increased weakness. She has been noncompliant with eating, drinking and medications. Family looking into facility that would be appropriate for her.

## 2018-12-24 NOTE — H&P (Signed)
Sandra Ferrell LDJ:[id number] DOB: October 09, 1961 DOA: 12/24/2018     PCP: Jinny Sanders, MD   Outpatient Specialists:     [facility] Patient arrived to ER on 12/24/18 at 1922  Patient coming from: home Lives With family    Chief Complaint:  Chief Complaint  Patient presents with  . Failure To Thrive    HPI: Sandra Ferrell is a 58 y.o. female with medical history significant of anemia chronic back pain, kidney stones, CHF, COPD, asthma fibromyalgia hyperlipidemia hypertension, DM2 , recent distal femur fracture    Presented with   persistent left leg pain decreased p.o. intake.  She had a femur fracture in August and been treated with ORIF by Dr. Grandville Silos but continued to have significant pain.  Has decreased p.o. intake and generalized fatigue.  2 weeks of dysuria.  No associated chest pain or shortness of breath.  No nausea vomiting or diarrhea. Reports main problem she has not had a good appetite, she have had a BM yesterday.   Since her surgery she have not been able to ambulate she have had a lot of nausea Yesterday she had to travel to Orthopedics by EMS   Regarding pertinent Chronic problems:  DM2 supposed  on Lantus but have not been taking only on  Actos and metformin HLD on Gemfibrozil  CHF last 2D echo was in 2012 EF between 60 to 03% grade 1 diastolic dysfunction  While in ER: Febrile up to 102.8  The following Work up has been ordered so far:  Orders Placed This Encounter  Procedures  . Culture, blood (Routine x 2)  . Urine culture  . DG Chest 2 View  . CT ABDOMEN PELVIS W CONTRAST  . Comprehensive metabolic panel  . Lactic acid, plasma  . CBC with Differential  . Protime-INR  . Urinalysis, Routine w reflex microscopic  . Diet NPO time specified  . Notify Physician if pt is possible Sepsis patient  . Document Actual / Estimated Weight  . Insert / maintain saline lock  . Cardiac monitoring  . Refer to Sidebar Report for: Sepsis Bundle  ED/IP  . Document vital signs within 1-hour of fluid bolus completion and notify provider of bolus completion  . Document Actual / Estimated Weight  . Insert peripheral IV x 2  . Initiate Carrier Fluid Protocol  . Call Code Sepsis ([facility] [phone]) Reason for Consult? tracking  . pharmacy consult  . Consult to hospitalist  . Pulse oximetry, continuous  . I-Stat beta hCG blood, ED  . ED EKG 12-Lead  . EKG 12-Lead    Following Medications were ordered in ER: Medications  levofloxacin (LEVAQUIN) IVPB 750 mg (has no administration in time range)  sodium chloride flush (NS) 0.9 % injection 3 mL (3 mLs Intravenous Given 12/24/18 2145)  acetaminophen (TYLENOL) tablet 650 mg (650 mg Oral Given 12/24/18 2126)  morphine 4 MG/ML injection 4 mg (4 mg Intravenous Given 12/24/18 2126)  diphenhydrAMINE (BENADRYL) injection 25 mg (25 mg Intravenous Given 12/24/18 2143)  sodium chloride 0.9 % bolus 1,000 mL (1,000 mLs Intravenous New Bag/Given 12/24/18 2143)  iopamidol (ISOVUE-300) 61 % injection 100 mL (100 mLs Intravenous Contrast Given 12/24/18 2212)    Significant initial  Findings: Abnormal Labs Reviewed  COMPREHENSIVE METABOLIC PANEL - Abnormal; Notable for the following components:      Result Value   Sodium 129 (*)    Potassium 3.3 (*)    Chloride 97 (*)    Glucose, Bld  243 (*)    Calcium 7.6 (*)    Albumin 2.4 (*)    AST 8 (*)    All other components within normal limits  CBC WITH DIFFERENTIAL/PLATELET - Abnormal; Notable for the following components:   WBC 20.1 (*)    RBC 3.45 (*)    Hemoglobin 9.4 (*)    HCT 31.3 (*)    Neutro Abs 15.8 (*)    Monocytes Absolute 2.1 (*)    Abs Immature Granulocytes 0.15 (*)    All other components within normal limits  PROTIME-INR - Abnormal; Notable for the following components:   Prothrombin Time 15.7 (*)    All other components within normal limits  URINALYSIS, ROUTINE W REFLEX MICROSCOPIC - Abnormal; Notable for the following components:     APPearance TURBID (*)    Hgb urine dipstick SMALL (*)    Protein, ur 100 (*)    Leukocytes,Ua LARGE (*)    WBC, UA >50 (*)    Bacteria, UA MANY (*)    All other components within normal limits   Lactic Acid, Venous    Component Value Date/Time   LATICACIDVEN 0.7 12/24/2018 1947    Na 129 K 3.3  Cr   stable,  Up from baseline see below Lab Results  Component Value Date   CREATININE 0.77 12/24/2018   CREATININE 0.53 08/20/2018   CREATININE 0.65 06/30/2018      WBC 20.1 HG/HCT down  from baseline see below    Component Value Date/Time   HGB 9.4 (L) 12/24/2018 1947   HCT 31.3 (L) 12/24/2018 1947     INR 1.26  Troponin (Point of Care Test) No results for input(s): TROPIPOC in the last 72 hours.     BNP (last 3 results) No results for input(s): BNP in the last 8760 hours.  ProBNP (last 3 results) No results for input(s): PROBNP in the last 8760 hours.     UA   evidence of UTI    CXR - NON acute  CTabd/pelvis - pyelonephritis  ECG:  Personally reviewed by me showing: HR : 106 Rhythm:   Sinus tachycardia     no evidence of ischemic changes QTC 448      ED Triage Vitals  Enc Vitals Group     BP 12/24/18 1937 132/61     Pulse Rate 12/24/18 1937 (!) 116     Resp 12/24/18 1937 20     Temp 12/24/18 1937 (!) 102.8 F (39.3 C)     Temp Source 12/24/18 1937 Oral     SpO2 12/24/18 1937 94 %     Weight --      Height 12/24/18 1950 4' 10"  (1.473 m)     Head Circumference --      Peak Flow --      Pain Score 12/24/18 2126 7     Pain Loc --      Pain Edu? --      Excl. in [place]? --   TMAX(24)@       Latest  Blood pressure 132/61, pulse (!) 109, temperature (!) 102.8 F (39.3 C), temperature source Oral, resp. rate (!) 26, height 4' 10"  (1.473 m), SpO2 99 %.    Hospitalist was called for admission for  chills, fatigue,   Review of Systems:    Pertinent positives include:  abdominal pain, nausea,  Constitutional:  No weight loss, night sweats,  Fevers, weight loss  HEENT:  No headaches, Difficulty swallowing,Tooth/dental problems,Sore throat,  No sneezing, itching,  ear ache, nasal congestion, post nasal drip,  Cardio-vascular:  No chest pain, Orthopnea, PND, anasarca, dizziness, palpitations.no Bilateral lower extremity swelling  GI:  No heartburn, indigestion  vomiting, diarrhea, change in bowel habits, loss of appetite, melena, blood in stool, hematemesis Resp:  no shortness of breath at rest. No dyspnea on exertion, No excess mucus, no productive cough, No non-productive cough, No coughing up of blood.No change in color of mucus.No wheezing. Skin:  no rash or lesions. No jaundice GU:  no dysuria, change in color of urine, no urgency or frequency. No straining to urinate.  No flank pain.  Musculoskeletal:  No joint pain or no joint swelling. No decreased range of motion. No back pain.  Psych:  No change in mood or affect. No depression or anxiety. No memory loss.  Neuro: no localizing neurological complaints, no tingling, no weakness, no double vision, no gait abnormality, no slurred speech, no confusion  All systems reviewed and apart from [place] all are negative  Past Medical History:   Past Medical History:  Diagnosis Date  . Allergic rhinitis, cause unspecified   . Anemia   . Anxiety state, unspecified   . Backache, unspecified   . Calculus of kidney   . Carpal tunnel syndrome, right   . CHF (congestive heart failure) ([place])    unspecified   . Chronic pain syndrome   . Constipation   . COPD (chronic obstructive pulmonary disease) ([place])   . Cough   . Depressive disorder, not elsewhere classified    managed with medications  . Difficulty in walking   . Displaced intertrochanteric fracture of left femur ([place])   . Esophageal reflux   . Extrinsic asthma with exacerbation   . Fibromyalgia   . Heart murmur   . Hyperlipidemia   . Hypertension   . Muscle weakness (generalized)   . Nicotine dependence, cigarettes,  uncomplicated   . Osteoarthrosis, unspecified whether generalized or localized, unspecified site   . Other abnormalities of gait and mobility   . Other and unspecified hyperlipidemia   . Other irritable bowel syndrome   . Other screening mammogram   . Personal history of unspecified urinary disorder   . Pneumonia   . Pressure ulcer of sacral region   . Routine general medical examination at a health care facility   . Routine gynecological examination   . Tobacco use disorder   . Type II or unspecified type diabetes mellitus without mention of complication, not stated as uncontrolled   . Unspecified fall, subsequent encounter   . Unspecified sleep apnea   . Urinary tract infection   . Vaginitis   . Wears glasses   . Wheezing       Past Surgical History:  Procedure Laterality Date  . ANTERIOR CERVICAL DECOMP/DISCECTOMY FUSION N/A 01/19/2015   Procedure: ANTERIOR CERVICAL DECOMPRESSION/DISCECTOMY FUSION 2 LEVELS;  Surgeon: Phylliss Bob, MD;  Location: [facility];  Service: Orthopedics;  Laterality: N/A;  Anterior cervical decompression fusion, cervical 5-6, cervical 6-7 with instrumentation and allograft  . ANTERIOR CERVICAL DECOMP/DISCECTOMY FUSION N/A 05/23/2017   Procedure: ANTERIOR CERVICAL DECOMPRESSION FUSION, CERVICAL 4-5 WITH INSTRUMENTATION AND ALLOGRAFT;  Surgeon: Phylliss Bob, MD;  Location: [facility];  Service: Orthopedics;  Laterality: N/A;  ANTERIOR CERVICAL DECOMPRESSION FUSION, CERVICAL 4-5 WITH INSTRUMENTATION AND ALLOGRAFT; REQUEST 2 HOURS AND FLIP ROOM  . APPENDECTOMY  1973  . BACK SURGERY    . CHOLECYSTECTOMY  08/2006  . COLONOSCOPY    . HARDWARE REMOVAL Left 08/20/2018   Procedure: HARDWARE  REMOVAL FROM LEFT KNEE;  Surgeon: Milly Jakob, MD;  Location: [facility];  Service: Orthopedics;  Laterality: Left;  . ORIF FEMUR FRACTURE Left 06/29/2018   Procedure: OPEN REDUCTION INTERNAL FIXATION (ORIF) DISTAL FEMUR FRACTURE;  Surgeon: Milly Jakob, MD;  Location: [facility];   Service: Orthopedics;  Laterality: Left;  . TRANSTHORACIC ECHOCARDIOGRAM  02/2011   mild LVH, nl EF, mild diastolic dysfunction, no wall motion abnl    Social History:  Ambulatory , bed bound     reports that she has been smoking cigarettes. She has a 35.00 pack-year smoking history. She has never used smokeless tobacco. She reports that she does not drink alcohol or use drugs.     Family History:   Family History  Problem Relation Age of Onset  . Stroke Father   . Colon cancer Cousin     Allergies: Allergies  Allergen Reactions  . Benazepril Anaphylaxis, Swelling and Other (See Comments)    Angioedema, throat swelling  . Diclofenac Sodium Other (See Comments)    GI bleed  . Fluticasone-Salmeterol Hives, Itching and Swelling    Tongue was swollen  . Nsaids Other (See Comments)    Rectal bleeding  . Penicillins Hives and Rash    Has patient had a PCN reaction causing immediate rash, facial/tongue/throat swelling, SOB or lightheadedness with hypotension: no Has patient had a PCN reaction causing severe rash involving mucus membranes or skin necrosis: no Has patient had a PCN reaction that required hospitalization no Has patient had a PCN reaction occurring within the last 10 years: no If all of the above answers are "NO", then may proceed with Cephalosporin use.   Marland Kitchen Morphine And Related     rash  . Aspirin Other (See Comments)    Burns stomach  . Propoxyphene Other (See Comments)    Darvocet - Headache     Prior to Admission medications   Medication Sig Start Date End Date Taking? Authorizing Provider  ALPRAZolam Duanne Moron) 0.5 MG tablet Take 1 tablet (0.5 mg total) by mouth 3 (three) times daily. 07/03/18  Yes Shelly Coss, MD  amitriptyline (ELAVIL) 75 MG tablet Take 1 tablet (75 mg total) by mouth at bedtime. 07/03/18  Yes Shelly Coss, MD  budesonide-formoterol (SYMBICORT) 160-4.5 MCG/ACT inhaler INHALE 2 PUFFS INTO THE LUNGS TWICE DAILY 12/01/18  Yes Bedsole,  Amy E, MD  dimenhyDRINATE (DRAMAMINE PO) Take 1 tablet by mouth daily as needed (nausea).   Yes [provider]  diphenhydramine-acetaminophen (TYLENOL PM) 25-500 MG TABS tablet Take 1 tablet by mouth at bedtime as needed.   Yes [provider]  Liniments (SALONPAS PAIN RELIEF PATCH EX) Apply 1 patch topically daily as needed (pain).   Yes [provider]  loperamide (IMODIUM A-D) 2 MG tablet Take 2 mg by mouth 4 (four) times daily as needed for diarrhea or loose stools.   Yes [provider]  metFORMIN (GLUCOPHAGE) 1000 MG tablet TAKE 1 TABLET BY MOUTH TWICE DAILY WITH MEALS Patient taking differently: Take 1,000 mg by mouth 2 (two) times daily with a meal.  06/13/18  Yes Bedsole, Amy E, MD  omeprazole (PRILOSEC OTC) 20 MG tablet Take 40 mg by mouth daily.   Yes [provider]  OVER THE COUNTER MEDICATION Apply 1 application topically daily as needed. Hempvana   Yes [provider]  PROAIR HFA 108 (90 Base) MCG/ACT inhaler TAKE 2 PUFFS BY MOUTH EVERY 4 HOURS AS NEEDED FOR WHEEZE OR FOR SHORTNESS OF BREATH Patient taking differently: Inhale  2 puffs into the lungs every 4 (four) hours as needed for wheezing or shortness of breath.  03/29/18  Yes Bedsole, Amy E, MD  ACCU-CHEK AVIVA PLUS test strip TEST BLOOD SUGAR TWICE DAILY AND DIRECTED 11/26/17   Jinny Sanders, MD  ACCU-CHEK SOFTCLIX LANCETS lancets CHECK BLOOD SUGAR TWICE DAILY AND AS DIRECTED 11/08/16   Jinny Sanders, MD  AIMSCO INSULIN SYR ULTRA THIN 31G X 5/16" 0.3 ML MISC  04/02/15   [provider]  Alcohol Swabs PADS Check blood sugar twice a day and as directed. Dx E11.9 11/03/15   Jinny Sanders, MD  ALPRAZolam (XANAX) 0.5 MG tablet TAKE 1 TABLET BY MOUTH THREE TIMES DAILY. Patient not taking: Reported on 12/24/2018 12/10/18   Jinny Sanders, MD  B-D ULTRAFINE III SHORT PEN 31G X 8 MM MISC USE AS DIRECTED WITH INSULIN PEN 09/18/16   Bedsole, Amy E, MD  Blood Glucose Monitoring Suppl  (ACCU-CHEK AVIVA PLUS) w/Device KIT Check blood sugar twice daily and as directed. 02/07/17   Bedsole, Amy E, MD  dicyclomine (BENTYL) 20 MG tablet Take 1 tablet (20 mg total) 2 (two) times daily by mouth. 09/24/17   Kirichenko, Tatyana, PA-C  ferrous sulfate 325 (65 FE) MG tablet Take 325 mg by mouth daily with breakfast.    [provider]  furosemide (LASIX) 20 MG tablet TAKE 1 TABLET(20 MG) BY MOUTH DAILY Patient taking differently: Take 20 mg by mouth daily.  11/24/18   Bedsole, Amy E, MD  gabapentin (NEURONTIN) 300 MG capsule TAKE 1 CAPSULE BY MOUTH IN THE MORNING, 1 CAPSULE IN THE AFTERNOON AND 2 CAPSULES AT BEDTIME Patient taking differently: Take 300-600 mg by mouth See admin instructions. 300 mg in the am, 300 mg in the afternoon and 600 mg at bedtime 10/27/18   Bedsole, Amy E, MD  gemfibrozil (LOPID) 600 MG tablet TAKE 1 TABLET BY MOUTH TWICE DAILY Patient taking differently: Take 600 mg by mouth 2 (two) times daily before a meal.  06/05/18   Bedsole, Amy E, MD  HYDROcodone-acetaminophen (NORCO/VICODIN) 5-325 MG tablet Take 1-2 tablets by mouth every 4 (four) hours as needed for moderate pain (pain score 4-6). Patient taking differently: Take 1-2 tablets by mouth See admin instructions. Take 2 tablets by mouth daily in the morning, take 1 tablet at 1400, & take 1 tablet at 2200. 07/03/18   Shelly Coss, MD  Insulin Glargine (LANTUS SOLOSTAR) 100 UNIT/ML Solostar Pen ADMINISTER 35 UNITS UNDER THE SKIN AT BEDTIME Patient taking differently: Inject 30 Units into the skin at bedtime.  06/20/18   Jinny Sanders, MD  Lancet Devices (ADJUSTABLE LANCING DEVICE) MISC Check blood sugar twice a day and as directed. Dx E11.9 11/03/15   Jinny Sanders, MD  methocarbamol (ROBAXIN) 500 MG tablet Take 2 tablets (1,000 mg total) by mouth at bedtime. Patient taking differently: Take 1,000 mg by mouth at bedtime as needed for muscle spasms.  08/07/18   Bedsole, Amy E, MD  pioglitazone (ACTOS) 45 MG  tablet TAKE 1 TABLET BY MOUTH EVERY DAY 12/15/18   Bedsole, Amy E, MD  polyethylene glycol (MIRALAX / GLYCOLAX) packet Take 17 g by mouth daily. Patient not taking: Reported on 12/24/2018 07/03/18   Shelly Coss, MD  sulfamethoxazole-trimethoprim (BACTRIM DS,SEPTRA DS) 800-160 MG tablet Take 1 tablet by mouth 2 (two) times daily. Patient not taking: Reported on 12/24/2018 10/17/18   Jinny Sanders, MD   Physical Exam: Blood pressure 132/61, pulse (!) 109, temperature Marland Kitchen)  102.8 F (39.3 C), temperature source Oral, resp. rate (!) 26, height '4\' 10"'$  (1.473 m), SpO2 99 %. 1. General:  in No  Acute distress   Chronically ill  acutely ill -appearing 2. Psychological: Alert and   Oriented 3. Head/ENT:    Dry Mucous Membranes                          Head Non traumatic, neck supple                           Poor Dentition 4. SKIN:  decreased Skin turgor,  Skin clean Dry and intact no rash 5. Heart: Regular rate and rhythm no  Murmur, no Rub or gallop 6. Lungs:  no wheezes or crackles   7. Abdomen: Soft,  non-tender, Non distended  obese  bowel sounds present 8. Lower extremities: no clubbing, cyanosis, no  edema 9. Neurologically Grossly intact, moving all 4 extremities equally   10. MSK: Normal range of motion   LABS:     Recent Labs  Lab 12/24/18 1947  WBC 20.1*  NEUTROABS 15.8*  HGB 9.4*  HCT 31.3*  MCV 90.7  PLT 119   Basic Metabolic Panel: Recent Labs  Lab 12/24/18 1947  NA 129*  K 3.3*  CL 97*  CO2 23  GLUCOSE 243*  BUN 13  CREATININE 0.77  CALCIUM 7.6*      Recent Labs  Lab 12/24/18 1947  AST 8*  ALT 7  ALKPHOS 81  BILITOT 0.5  PROT 7.4  ALBUMIN 2.4*   No results for input(s): LIPASE, AMYLASE in the last 168 hours. No results for input(s): AMMONIA in the last 168 hours.    HbA1C: No results for input(s): HGBA1C in the last 72 hours. CBG: No results for input(s): GLUCAP in the last 168 hours.    Urine analysis:    Component Value Date/Time    COLORURINE YELLOW 12/24/2018 2008   APPEARANCEUR TURBID (A) 12/24/2018 2008   LABSPEC 1.019 12/24/2018 2008   PHURINE 5.0 12/24/2018 2008   GLUCOSEU NEGATIVE 12/24/2018 2008   GLUCOSEU NEGATIVE 12/26/2010 1044   HGBUR SMALL (A) 12/24/2018 2008   HGBUR large 04/10/2010 1507   BILIRUBINUR NEGATIVE 12/24/2018 2008   BILIRUBINUR neg 12/12/2016 0804   KETONESUR NEGATIVE 12/24/2018 2008   PROTEINUR 100 (A) 12/24/2018 2008   UROBILINOGEN negative 12/12/2016 0804   UROBILINOGEN 0.2 01/12/2015 1037   NITRITE NEGATIVE 12/24/2018 2008   LEUKOCYTESUR LARGE (A) 12/24/2018 2008      Cultures:    Component Value Date/Time   SDES URINE, RANDOM 12/29/2012 2349   SPECREQUEST NONE 12/29/2012 2349   CULT KLEBSIELLA PNEUMONIAE 12/29/2012 2349   REPTSTATUS 12/31/2012 FINAL 12/29/2012 2349     Radiological Exams on Admission: Dg Chest 2 View  Result Date: 12/24/2018 CLINICAL DATA:  58 year old female with suspected sepsis. EXAM: CHEST - 2 VIEW COMPARISON:  Chest radiographs 06/28/2018 and earlier. FINDINGS: Semi upright AP and lateral views. Stable lung volumes and mediastinal contours. Cardiac size at the upper limits of normal. Visualized tracheal air column is within normal limits. Both lungs appear clear. No pneumothorax or pleural effusion. Multilevel prior cervical spine fusion. No acute osseous abnormality identified. Negative visible bowel gas pattern. IMPRESSION: No acute cardiopulmonary abnormality. Electronically Signed   By: Genevie Ann M.D.   On: 12/24/2018 20:29   Ct Abdomen Pelvis W Contrast  Result Date: 12/24/2018 CLINICAL DATA:  Abdominal  pain with fever EXAM: CT ABDOMEN AND PELVIS WITH CONTRAST TECHNIQUE: Multidetector CT imaging of the abdomen and pelvis was performed using the standard protocol following bolus administration of intravenous contrast. CONTRAST:  123m ISOVUE-300 IOPAMIDOL (ISOVUE-300) INJECTION 61% COMPARISON:  09/24/2017 FINDINGS: Lower chest: The included heart is  borderline enlarged. No pericardial effusion. Subsegmental atelectasis and/or scarring is noted at each lung base. Hepatobiliary: No focal liver abnormality is seen. Status post cholecystectomy. No biliary dilatation. Pancreas: Unremarkable. No pancreatic ductal dilatation or surrounding inflammatory changes. Spleen: Normal in size without focal abnormality. Adrenals/Urinary Tract: Normal bilateral adrenal glands and right kidney. In homogeneous enhancement of the left kidney with perinephric fat stranding in a pattern suspicious for pyelonephritis and lobar nephronia. The urinary bladder is thick-walled in appearance with mucosal enhancement consistent with cystitis. Stomach/Bowel: Stomach is within normal limits. Appendix is surgically absent. No evidence of bowel wall thickening, distention, or inflammatory changes. Vascular/Lymphatic: Aortoiliac atherosclerosis. No lymphadenopathy. Reproductive: Uterus and bilateral adnexa are unremarkable. Other: No abdominal wall hernia or abnormality. No abdominopelvic ascites. Musculoskeletal: Thoracolumbar spondylosis with L4-5 PLIF procedure. IMPRESSION: 1. Heterogeneous enhancement of the left kidney with perinephric fat stranding suspicious for pyelonephritis and lobar nephronia. 2. Thick-walled appearance of the urinary bladder with mucosal enhancement consistent with cystitis. 3. Thoracolumbar spondylosis with L4-5 PLIF procedure. Electronically Signed   By: DAshley RoyaltyM.D.   On: 12/24/2018 22:43    Chart has been reviewed    Assessment/Plan  58y.o. female with medical history significant of anemia chronic back pain, kidney stones, CHF, COPD, asthma fibromyalgia hyperlipidemia hypertension, DM2 , recent distal femur fracture    Admitted for sepsis secondary to pyelonephritis  Present on Admission: . Pyelonephritis -  -  Pen allergic Will initiate Levaquin       await results of urine culture and adjust antibiotic coverage as needed Rehydrate monitor  for improvement . Hyperlipidemia LDL goal <100 stable continue home medications   . GERD stable continue home medications . Fibromyalgia stable continue home medications . Sleep apnea not on CPAP . Essential hypertension, benign allow some permissive hypertension for tonight some soft blood pressures in the ER will resume when able . COPD, moderate ([place] currently stable continue home medications Poor p.o. intake will have nutrition evaluate. Poor ambulation mobility issues patient is bedbound at this point will have PT OT evaluate for discharge needs.  Family is adamant that they do not wish her to be placed  Other plan as per orders.  DVT prophylaxis:  lovenox  Code Status:  FULL CODE  as per patient  I had personally discussed CODE STATUS with patient and family    Family Communication:   Family  at  Bedside  plan of care was discussed with  , Husband,    Disposition Plan:     likely will need placement for rehabilitation                       But family feels strongly they do not                    Would benefit from PT/OT eval prior to DC  Ordered                    Nutrition    consulted  Consults called: none  Admission status: inpatient     Expect 2 midnight stay secondary to severity of patient's current illness including      Severe lab/radiological/exam abnormalities including:  Abnormal CT showing pyelonephritis   and extensive comorbidities including:  Chronic pain  DM2    CHF  COPD     That are currently affecting medical management.   I expect  patient to be hospitalized for 2 midnights requiring inpatient medical care.  Patient is at high risk for adverse outcome (such as loss of life or disability) if not treated.  Indication for inpatient stay as follows:    Hemodynamic instability despite maximal medical therapy,    severe pain requiring acute inpatient management,  inability to maintain oral hydration    Need  for IV antibiotics, IV fluids,      Level of care     tele  For 12H         Jonnelle Lawniczak 12/24/2018, 11:48 PM    [facility]     after 2 AM please page floor coverage PA If 7AM-7PM, please contact the day team taking care of the patient using [email or web address]

## 2018-12-24 NOTE — ED Provider Notes (Signed)
[facility] DEPT Provider Note   CSN: [id number] Arrival date & time: 12/24/18  1922    History   Chief Complaint Chief Complaint  Patient presents with  . Failure To Thrive    HPI Sandra Ferrell is a 57 y.o. female with history of CHF, COPD, chronic pain syndrome, heart murmur, HLD, HTN, diabetes mellitus presents for evaluation of persistent left leg pain.  She underwent ORIF of a left femur fracture in August with Dr. Grandville Silos.  Her significant other who is at the bedside states that she has had significant difficulty ambulating since then.  He notes more recently she has had decreased oral intake and generalized weakness.  The patient reports that she has had some dysuria for the past 2 weeks.  Denies any chest pain, shortness of breath.  She does note some lower abdominal pain but has difficulty describing it and is unsure when it began.  No nausea, vomiting, diarrhea, constipation, melena, or hematochezia.  Both she and her significant other are poor historians and it is difficult to assess the chronicity of her symptoms.  She was found to be febrile with a temperature of 102.8 F here in the ED but her significant other does not know how long she has been febrile.  He is her primary caretaker.     The history is provided by the patient and the spouse.    Past Medical History:  Diagnosis Date  . Allergic rhinitis, cause unspecified   . Anemia   . Anxiety state, unspecified   . Backache, unspecified   . Calculus of kidney   . Carpal tunnel syndrome, right   . CHF (congestive heart failure) ([place])    unspecified   . Chronic pain syndrome   . Constipation   . COPD (chronic obstructive pulmonary disease) ([place])   . Cough   . Depressive disorder, not elsewhere classified    managed with medications  . Difficulty in walking   . Displaced intertrochanteric fracture of left femur ([place])   . Esophageal reflux   . Extrinsic asthma with exacerbation     . Fibromyalgia   . Heart murmur   . Hyperlipidemia   . Hypertension   . Muscle weakness (generalized)   . Nicotine dependence, cigarettes, uncomplicated   . Osteoarthrosis, unspecified whether generalized or localized, unspecified site   . Other abnormalities of gait and mobility   . Other and unspecified hyperlipidemia   . Other irritable bowel syndrome   . Other screening mammogram   . Personal history of unspecified urinary disorder   . Pneumonia   . Pressure ulcer of sacral region   . Routine general medical examination at a health care facility   . Routine gynecological examination   . Tobacco use disorder   . Type II or unspecified type diabetes mellitus without mention of complication, not stated as uncontrolled   . Unspecified fall, subsequent encounter   . Unspecified sleep apnea   . Urinary tract infection   . Vaginitis   . Wears glasses   . Wheezing     Patient Active Problem List   Diagnosis Date Noted  . Pyelonephritis 12/24/2018  . Statin intolerance 09/05/2018  . Closed femur fracture ([place]) 06/29/2018  . Anemia 06/29/2018  . Femur fracture, left ([place]) 06/29/2018  . Leg cramping 09/04/2017  . Bilateral carpal tunnel syndrome 04/16/2017  . Bilateral leg weakness 03/07/2017  . Muscular deconditioning 03/07/2017  . Frequent falls 03/05/2017  . History of fusion of  cervical spine 03/05/2017  . History of lumbar fusion 03/05/2017  . COPD exacerbation ([place]) 10/01/2016  . COPD, moderate ([place]) 03/22/2016  . Counseling regarding end of life decision making 09/13/2015  . Microalbuminuria 05/19/2015  . Unspecified constipation 07/10/2013  . Essential hypertension, benign 02/21/2011  . UTI'S, HX OF 12/26/2010  . Chronic low back pain 02/22/2009  . Diabetes mellitus with no complication ([place]) 31/54/0086  . ALLERGIC RHINITIS 06/24/2007  . RENAL CALCULUS 06/24/2007  . OSTEOARTHRITIS 06/24/2007  . GAD (generalized anxiety disorder) 04/03/2007  . Hyperlipidemia LDL  goal <100 03/12/2007  . TOBACCO ABUSE 03/12/2007  . Major depression, recurrent ([place]) 03/12/2007  . GERD 03/12/2007  . Fibromyalgia 03/12/2007  . Sleep apnea 03/12/2007    Past Surgical History:  Procedure Laterality Date  . ANTERIOR CERVICAL DECOMP/DISCECTOMY FUSION N/A 01/19/2015   Procedure: ANTERIOR CERVICAL DECOMPRESSION/DISCECTOMY FUSION 2 LEVELS;  Surgeon: Phylliss Bob, MD;  Location: [facility];  Service: Orthopedics;  Laterality: N/A;  Anterior cervical decompression fusion, cervical 5-6, cervical 6-7 with instrumentation and allograft  . ANTERIOR CERVICAL DECOMP/DISCECTOMY FUSION N/A 05/23/2017   Procedure: ANTERIOR CERVICAL DECOMPRESSION FUSION, CERVICAL 4-5 WITH INSTRUMENTATION AND ALLOGRAFT;  Surgeon: Phylliss Bob, MD;  Location: [facility];  Service: Orthopedics;  Laterality: N/A;  ANTERIOR CERVICAL DECOMPRESSION FUSION, CERVICAL 4-5 WITH INSTRUMENTATION AND ALLOGRAFT; REQUEST 2 HOURS AND FLIP ROOM  . APPENDECTOMY  1973  . BACK SURGERY    . CHOLECYSTECTOMY  08/2006  . COLONOSCOPY    . HARDWARE REMOVAL Left 08/20/2018   Procedure: HARDWARE REMOVAL FROM LEFT KNEE;  Surgeon: Milly Jakob, MD;  Location: [facility];  Service: Orthopedics;  Laterality: Left;  . ORIF FEMUR FRACTURE Left 06/29/2018   Procedure: OPEN REDUCTION INTERNAL FIXATION (ORIF) DISTAL FEMUR FRACTURE;  Surgeon: Milly Jakob, MD;  Location: [facility];  Service: Orthopedics;  Laterality: Left;  . TRANSTHORACIC ECHOCARDIOGRAM  02/2011   mild LVH, nl EF, mild diastolic dysfunction, no wall motion abnl     OB History   No obstetric history on file.      Home Medications    Prior to Admission medications   Medication Sig Start Date End Date Taking? Authorizing Provider  ALPRAZolam Duanne Moron) 0.5 MG tablet Take 1 tablet (0.5 mg total) by mouth 3 (three) times daily. 07/03/18  Yes Shelly Coss, MD  amitriptyline (ELAVIL) 75 MG tablet Take 1 tablet (75 mg total) by mouth at bedtime. 07/03/18  Yes Shelly Coss, MD    budesonide-formoterol (SYMBICORT) 160-4.5 MCG/ACT inhaler INHALE 2 PUFFS INTO THE LUNGS TWICE DAILY 12/01/18  Yes Bedsole, Amy E, MD  dimenhyDRINATE (DRAMAMINE PO) Take 1 tablet by mouth daily as needed (nausea).   Yes [provider]  diphenhydramine-acetaminophen (TYLENOL PM) 25-500 MG TABS tablet Take 1 tablet by mouth at bedtime as needed.   Yes [provider]  Liniments (SALONPAS PAIN RELIEF PATCH EX) Apply 1 patch topically daily as needed (pain).   Yes [provider]  loperamide (IMODIUM A-D) 2 MG tablet Take 2 mg by mouth 4 (four) times daily as needed for diarrhea or loose stools.   Yes [provider]  metFORMIN (GLUCOPHAGE) 1000 MG tablet TAKE 1 TABLET BY MOUTH TWICE DAILY WITH MEALS Patient taking differently: Take 1,000 mg by mouth 2 (two) times daily with a meal.  06/13/18  Yes Bedsole, Amy E, MD  omeprazole (PRILOSEC OTC) 20 MG tablet Take 40 mg by mouth daily.   Yes [provider]  OVER THE COUNTER MEDICATION Apply 1 application topically daily as  needed. Hempvana   Yes [provider]  PROAIR HFA 108 (90 Base) MCG/ACT inhaler TAKE 2 PUFFS BY MOUTH EVERY 4 HOURS AS NEEDED FOR WHEEZE OR FOR SHORTNESS OF BREATH Patient taking differently: Inhale 2 puffs into the lungs every 4 (four) hours as needed for wheezing or shortness of breath.  03/29/18  Yes Bedsole, Amy E, MD  ACCU-CHEK AVIVA PLUS test strip TEST BLOOD SUGAR TWICE DAILY AND DIRECTED 11/26/17   Jinny Sanders, MD  ACCU-CHEK SOFTCLIX LANCETS lancets CHECK BLOOD SUGAR TWICE DAILY AND AS DIRECTED 11/08/16   Jinny Sanders, MD  AIMSCO INSULIN SYR ULTRA THIN 31G X 5/16" 0.3 ML MISC  04/02/15   [provider]  Alcohol Swabs PADS Check blood sugar twice a day and as directed. Dx E11.9 11/03/15   Jinny Sanders, MD  ALPRAZolam (XANAX) 0.5 MG tablet TAKE 1 TABLET BY MOUTH THREE TIMES DAILY. Patient not taking: Reported on 12/24/2018 12/10/18   Jinny Sanders, MD  B-D ULTRAFINE  III SHORT PEN 31G X 8 MM MISC USE AS DIRECTED WITH INSULIN PEN 09/18/16   Bedsole, Amy E, MD  Blood Glucose Monitoring Suppl (ACCU-CHEK AVIVA PLUS) w/Device KIT Check blood sugar twice daily and as directed. 02/07/17   Bedsole, Amy E, MD  dicyclomine (BENTYL) 20 MG tablet Take 1 tablet (20 mg total) 2 (two) times daily by mouth. 09/24/17   Kirichenko, Tatyana, PA-C  ferrous sulfate 325 (65 FE) MG tablet Take 325 mg by mouth daily with breakfast.    [provider]  furosemide (LASIX) 20 MG tablet TAKE 1 TABLET(20 MG) BY MOUTH DAILY Patient taking differently: Take 20 mg by mouth daily.  11/24/18   Bedsole, Amy E, MD  gabapentin (NEURONTIN) 300 MG capsule TAKE 1 CAPSULE BY MOUTH IN THE MORNING, 1 CAPSULE IN THE AFTERNOON AND 2 CAPSULES AT BEDTIME Patient taking differently: Take 300-600 mg by mouth See admin instructions. 300 mg in the am, 300 mg in the afternoon and 600 mg at bedtime 10/27/18   Bedsole, Amy E, MD  gemfibrozil (LOPID) 600 MG tablet TAKE 1 TABLET BY MOUTH TWICE DAILY Patient taking differently: Take 600 mg by mouth 2 (two) times daily before a meal.  06/05/18   Bedsole, Amy E, MD  HYDROcodone-acetaminophen (NORCO/VICODIN) 5-325 MG tablet Take 1-2 tablets by mouth every 4 (four) hours as needed for moderate pain (pain score 4-6). Patient taking differently: Take 1-2 tablets by mouth See admin instructions. Take 2 tablets by mouth daily in the morning, take 1 tablet at 1400, & take 1 tablet at 2200. 07/03/18   Shelly Coss, MD  Insulin Glargine (LANTUS SOLOSTAR) 100 UNIT/ML Solostar Pen ADMINISTER 35 UNITS UNDER THE SKIN AT BEDTIME Patient taking differently: Inject 30 Units into the skin at bedtime.  06/20/18   Jinny Sanders, MD  Lancet Devices (ADJUSTABLE LANCING DEVICE) MISC Check blood sugar twice a day and as directed. Dx E11.9 11/03/15   Jinny Sanders, MD  methocarbamol (ROBAXIN) 500 MG tablet Take 2 tablets (1,000 mg total) by mouth at bedtime. Patient taking differently:  Take 1,000 mg by mouth at bedtime as needed for muscle spasms.  08/07/18   Bedsole, Amy E, MD  pioglitazone (ACTOS) 45 MG tablet TAKE 1 TABLET BY MOUTH EVERY DAY 12/15/18   Bedsole, Amy E, MD  polyethylene glycol (MIRALAX / GLYCOLAX) packet Take 17 g by mouth daily. Patient not taking: Reported on 12/24/2018 07/03/18   Shelly Coss, MD  sulfamethoxazole-trimethoprim (BACTRIM DS,SEPTRA DS)  800-160 MG tablet Take 1 tablet by mouth 2 (two) times daily. Patient not taking: Reported on 12/24/2018 10/17/18   Jinny Sanders, MD    Family History Family History  Problem Relation Age of Onset  . Stroke Father   . Colon cancer Cousin     Social History Social History   Tobacco Use  . Smoking status: Current Every Day Smoker    Packs/day: 1.00    Years: 35.00    Pack years: 35.00    Types: Cigarettes  . Smokeless tobacco: Never Used  Substance Use Topics  . Alcohol use: No  . Drug use: No     Allergies   Benazepril; Diclofenac sodium; Fluticasone-salmeterol; Nsaids; Penicillins; Morphine and related; Aspirin; and Propoxyphene   Review of Systems Review of Systems  Constitutional: Positive for chills, fatigue and fever.  Respiratory: Negative for shortness of breath.   Cardiovascular: Negative for chest pain.  Gastrointestinal: Positive for abdominal pain. Negative for diarrhea, nausea and vomiting.  Genitourinary: Positive for dysuria.  Musculoskeletal: Positive for arthralgias.  All other systems reviewed and are negative.    Physical Exam Updated Vital Signs BP (!) 110/55   Pulse 98   Temp (!) 100.4 F (38 C) (Oral)   Resp 19   Ht 4' 10" (1.473 m)   SpO2 (!) 89%   BMI 48.07 kg/m   Physical Exam Vitals signs and nursing note reviewed.  Constitutional:      General: She is not in acute distress.    Appearance: She is well-developed. She is obese. She is ill-appearing.  HENT:     Head: Normocephalic and atraumatic.  Eyes:     General:        Right eye: No  discharge.        Left eye: No discharge.     Extraocular Movements: Extraocular movements intact.     Conjunctiva/sclera: Conjunctivae normal.     Pupils: Pupils are equal, round, and reactive to light.  Neck:     Musculoskeletal: Normal range of motion and neck supple.     Vascular: No JVD.     Trachea: No tracheal deviation.  Cardiovascular:     Rate and Rhythm: Tachycardia present.     Pulses: Normal pulses.     Heart sounds: Normal heart sounds.     Comments: 2+ radial and DP/PT pulses bilaterally.  No lower extremity edema. Pulmonary:     Effort: Pulmonary effort is normal.  Abdominal:     General: Bowel sounds are normal. There is no distension.     Palpations: Abdomen is soft.     Tenderness: There is abdominal tenderness in the right lower quadrant, suprapubic area and left lower quadrant. There is no guarding or rebound. Negative signs include Murphy's sign and Rovsing's sign.     Comments: obese  Genitourinary:    Comments: Stage I decubitus ulcer noted to the sacral region. Musculoskeletal:     Comments: Tenderness to palpation of the left thigh and knee.  Patient reports this is chronic and unchanged.  No erythema, swelling, or crepitus noted. Well-healed surgical scar noted.  Skin:    General: Skin is warm and dry.     Findings: No erythema.  Neurological:     Mental Status: She is alert.  Psychiatric:        Behavior: Behavior normal.      ED Treatments / Results  Labs (all labs ordered are listed, but only abnormal results are displayed) Labs Reviewed  COMPREHENSIVE  METABOLIC PANEL - Abnormal; Notable for the following components:      Result Value   Sodium 129 (*)    Potassium 3.3 (*)    Chloride 97 (*)    Glucose, Bld 243 (*)    Calcium 7.6 (*)    Albumin 2.4 (*)    AST 8 (*)    All other components within normal limits  CBC WITH DIFFERENTIAL/PLATELET - Abnormal; Notable for the following components:   WBC 20.1 (*)    RBC 3.45 (*)    Hemoglobin  9.4 (*)    HCT 31.3 (*)    Neutro Abs 15.8 (*)    Monocytes Absolute 2.1 (*)    Abs Immature Granulocytes 0.15 (*)    All other components within normal limits  PROTIME-INR - Abnormal; Notable for the following components:   Prothrombin Time 15.7 (*)    All other components within normal limits  URINALYSIS, ROUTINE W REFLEX MICROSCOPIC - Abnormal; Notable for the following components:   APPearance TURBID (*)    Hgb urine dipstick SMALL (*)    Protein, ur 100 (*)    Leukocytes,Ua LARGE (*)    WBC, UA >50 (*)    Bacteria, UA MANY (*)    All other components within normal limits  CULTURE, BLOOD (ROUTINE X 2)  CULTURE, BLOOD (ROUTINE X 2)  URINE CULTURE  LACTIC ACID, PLASMA  CREATININE, URINE, RANDOM  SODIUM, URINE, RANDOM  LACTIC ACID, PLASMA  OSMOLALITY, URINE  OSMOLALITY  MAGNESIUM  PHOSPHORUS  PREALBUMIN  I-STAT BETA HCG BLOOD, ED ([facility], [facility], [facility] ONLY)    EKG EKG Interpretation  Date/Time:  Wednesday December 24 2018 21:39:07 EST Ventricular Rate:  106 PR Interval:    QRS Duration: 80 QT Interval:  337 QTC Calculation: 448 R Axis:   -6 Text Interpretation:  Sinus tachycardia Anterior infarct, age indeterminate When compared with ECG of 06/29/2018 No significant change was found Confirmed by Francine Graven [phone]) on 12/24/2018 10:17:36 PM   Radiology Dg Chest 2 View  Result Date: 12/24/2018 CLINICAL DATA:  58 year old female with suspected sepsis. EXAM: CHEST - 2 VIEW COMPARISON:  Chest radiographs 06/28/2018 and earlier. FINDINGS: Semi upright AP and lateral views. Stable lung volumes and mediastinal contours. Cardiac size at the upper limits of normal. Visualized tracheal air column is within normal limits. Both lungs appear clear. No pneumothorax or pleural effusion. Multilevel prior cervical spine fusion. No acute osseous abnormality identified. Negative visible bowel gas pattern. IMPRESSION: No acute cardiopulmonary abnormality. Electronically Signed   By: Genevie Ann  M.D.   On: 12/24/2018 20:29   Ct Abdomen Pelvis W Contrast  Result Date: 12/24/2018 CLINICAL DATA:  Abdominal pain with fever EXAM: CT ABDOMEN AND PELVIS WITH CONTRAST TECHNIQUE: Multidetector CT imaging of the abdomen and pelvis was performed using the standard protocol following bolus administration of intravenous contrast. CONTRAST:  193m ISOVUE-300 IOPAMIDOL (ISOVUE-300) INJECTION 61% COMPARISON:  09/24/2017 FINDINGS: Lower chest: The included heart is borderline enlarged. No pericardial effusion. Subsegmental atelectasis and/or scarring is noted at each lung base. Hepatobiliary: No focal liver abnormality is seen. Status post cholecystectomy. No biliary dilatation. Pancreas: Unremarkable. No pancreatic ductal dilatation or surrounding inflammatory changes. Spleen: Normal in size without focal abnormality. Adrenals/Urinary Tract: Normal bilateral adrenal glands and right kidney. In homogeneous enhancement of the left kidney with perinephric fat stranding in a pattern suspicious for pyelonephritis and lobar nephronia. The urinary bladder is thick-walled in appearance with mucosal enhancement consistent with cystitis. Stomach/Bowel: Stomach is within normal limits.  Appendix is surgically absent. No evidence of bowel wall thickening, distention, or inflammatory changes. Vascular/Lymphatic: Aortoiliac atherosclerosis. No lymphadenopathy. Reproductive: Uterus and bilateral adnexa are unremarkable. Other: No abdominal wall hernia or abnormality. No abdominopelvic ascites. Musculoskeletal: Thoracolumbar spondylosis with L4-5 PLIF procedure. IMPRESSION: 1. Heterogeneous enhancement of the left kidney with perinephric fat stranding suspicious for pyelonephritis and lobar nephronia. 2. Thick-walled appearance of the urinary bladder with mucosal enhancement consistent with cystitis. 3. Thoracolumbar spondylosis with L4-5 PLIF procedure. Electronically Signed   By: Ashley Royalty M.D.   On: 12/24/2018 22:43     Procedures .Critical Care Performed by: Renita Papa, PA-C Authorized by: Renita Papa, PA-C   Critical care provider statement:    Critical care time (minutes):  40   Critical care was necessary to treat or prevent imminent or life-threatening deterioration of the following conditions:  Sepsis   Critical care was time spent personally by me on the following activities:  Discussions with consultants, evaluation of patient's response to treatment, examination of patient, ordering and performing treatments and interventions, ordering and review of laboratory studies, ordering and review of radiographic studies, pulse oximetry, re-evaluation of patient's condition, obtaining history from patient or surrogate and review of old charts   (including critical care time)  Medications Ordered in ED Medications  levofloxacin (LEVAQUIN) IVPB 750 mg (750 mg Intravenous New Bag/Given 12/24/18 2330)  sodium chloride flush (NS) 0.9 % injection 3 mL (3 mLs Intravenous Given 12/24/18 2145)  acetaminophen (TYLENOL) tablet 650 mg (650 mg Oral Given 12/24/18 2126)  morphine 4 MG/ML injection 4 mg (4 mg Intravenous Given 12/24/18 2126)  diphenhydrAMINE (BENADRYL) injection 25 mg (25 mg Intravenous Given 12/24/18 2143)  sodium chloride 0.9 % bolus 1,000 mL (0 mLs Intravenous Stopped 12/24/18 2304)  iopamidol (ISOVUE-300) 61 % injection 100 mL (100 mLs Intravenous Contrast Given 12/24/18 2212)     Initial Impression / Assessment and Plan / ED Course  I have reviewed the triage vital signs and the nursing notes.  Pertinent labs & imaging results that were available during my care of the patient were reviewed by me and considered in my medical decision making (see chart for details).        Patient presents for progressively worsening generalized weakness, decreased appetite, dysuria.  Chronic left leg pain after ORIF of femur fracture with Dr. Grandville Silos in August, has not been able to ambulate independently  since.  Found to be febrile and tachycardic in the ED.  Initial labs obtained reviewed by me show leukocytosis, anemia, hyperglycemia, hyponatremia, hypokalemia.  Her UA does suggest UTI, will culture.  Given fever, tachycardia, leukocytosis, and infectious source, code sepsis was called.  She was given IV fluids and levofloxacin.  Initial lactate negative.  Blood pressure stable in the ED.  Chest x-ray shows no acute cardiopulmonary abnormalities.  EKG shows sinus tachycardia, no significant changes from last tracing.  CT of the abdomen and pelvis does show findings suspicious for pyelonephritis.  Spoke with Dr. Roel Cluck with [facility] hospitalist service who agrees to assume care of patient and bring her into the hospital for further evaluation and management.  Final Clinical Impressions(s) / ED Diagnoses   Final diagnoses:  Sepsis without acute organ dysfunction, due to unspecified organism [facility])    ED Discharge Orders    None       Renita Papa, PA-C 12/25/18 [place], [place], DO 12/26/18 [phone]

## 2018-12-25 ENCOUNTER — Inpatient Hospital Stay (HOSPITAL_COMMUNITY): Payer: Medicare Other

## 2018-12-25 ENCOUNTER — Encounter (HOSPITAL_COMMUNITY): Payer: Self-pay

## 2018-12-25 ENCOUNTER — Other Ambulatory Visit: Payer: Self-pay

## 2018-12-25 LAB — GLUCOSE, CAPILLARY
Glucose-Capillary: 116 mg/dL — ABNORMAL HIGH (ref 70–99)
Glucose-Capillary: 119 mg/dL — ABNORMAL HIGH (ref 70–99)
Glucose-Capillary: 206 mg/dL — ABNORMAL HIGH (ref 70–99)
Glucose-Capillary: 231 mg/dL — ABNORMAL HIGH (ref 70–99)
Glucose-Capillary: 280 mg/dL — ABNORMAL HIGH (ref 70–99)
Glucose-Capillary: 285 mg/dL — ABNORMAL HIGH (ref 70–99)

## 2018-12-25 LAB — CBC
HCT: 25.8 % — ABNORMAL LOW (ref 36.0–46.0)
Hemoglobin: 7.7 g/dL — ABNORMAL LOW (ref 12.0–15.0)
MCH: 27.3 pg (ref 26.0–34.0)
MCHC: 29.8 g/dL — AB (ref 30.0–36.0)
MCV: 91.5 fL (ref 80.0–100.0)
Platelets: 295 10*3/uL (ref 150–400)
RBC: 2.82 MIL/uL — ABNORMAL LOW (ref 3.87–5.11)
RDW: 14.5 % (ref 11.5–15.5)
WBC: 18.2 10*3/uL — ABNORMAL HIGH (ref 4.0–10.5)
nRBC: 0 % (ref 0.0–0.2)

## 2018-12-25 LAB — COMPREHENSIVE METABOLIC PANEL
ALT: 7 U/L (ref 0–44)
AST: 8 U/L — AB (ref 15–41)
Albumin: 2 g/dL — ABNORMAL LOW (ref 3.5–5.0)
Alkaline Phosphatase: 82 U/L (ref 38–126)
Anion gap: 8 (ref 5–15)
BUN: 12 mg/dL (ref 6–20)
CO2: 24 mmol/L (ref 22–32)
Calcium: 7.5 mg/dL — ABNORMAL LOW (ref 8.9–10.3)
Chloride: 102 mmol/L (ref 98–111)
Creatinine, Ser: 0.75 mg/dL (ref 0.44–1.00)
GFR calc Af Amer: >60 mL/min (ref >60)
GFR calc non Af Amer: >60 mL/min (ref >60)
GLUCOSE: 139 mg/dL — AB (ref 70–99)
Potassium: 3.1 mmol/L — ABNORMAL LOW (ref 3.5–5.1)
Sodium: 134 mmol/L — ABNORMAL LOW (ref 135–145)
Total Bilirubin: 0.4 mg/dL (ref 0.3–1.2)
Total Protein: 6.5 g/dL (ref 6.5–8.1)

## 2018-12-25 LAB — TSH: TSH: 1.476 u[IU]/mL (ref 0.350–4.500)

## 2018-12-25 LAB — MAGNESIUM
Magnesium: 1.2 mg/dL — ABNORMAL LOW (ref 1.7–2.4)
Magnesium: 1.3 mg/dL — ABNORMAL LOW (ref 1.7–2.4)

## 2018-12-25 LAB — PHOSPHORUS
Phosphorus: 3.1 mg/dL (ref 2.5–4.6)
Phosphorus: 3.1 mg/dL (ref 2.5–4.6)

## 2018-12-25 LAB — PREALBUMIN: PREALBUMIN: 6.4 mg/dL — AB (ref 18–38)

## 2018-12-25 LAB — OSMOLALITY: Osmolality: 278 mOsm/kg (ref 275–295)

## 2018-12-25 LAB — OSMOLALITY, URINE: Osmolality, Ur: 459 mOsm/kg (ref 300–900)

## 2018-12-25 LAB — LACTIC ACID, PLASMA: Lactic Acid, Venous: 0.9 mmol/L (ref 0.5–1.9)

## 2018-12-25 MED ORDER — INSULIN GLARGINE 100 UNIT/ML ~~LOC~~ SOLN
15.0000 [IU] | Freq: Every day | SUBCUTANEOUS | Status: DC
  Administered 2018-12-25 – 2018-12-26 (×2): 15 [IU] via SUBCUTANEOUS
  Filled 2018-12-25 (×3): qty 0.15

## 2018-12-25 MED ORDER — ONDANSETRON HCL 4 MG PO TABS: 4.0000 mg | ORAL_TABLET | Freq: Four times a day (QID) | ORAL | Status: DC | PRN

## 2018-12-25 MED ORDER — HYDROCODONE-ACETAMINOPHEN 5-325 MG PO TABS
1.0000 | ORAL_TABLET | ORAL | Status: DC | PRN
  Administered 2018-12-26: 1 via ORAL
  Administered 2018-12-27 (×2): 2 via ORAL
  Filled 2018-12-25 (×2): qty 2
  Filled 2018-12-25: qty 1

## 2018-12-25 MED ORDER — AMITRIPTYLINE HCL 25 MG PO TABS
75.0000 mg | ORAL_TABLET | Freq: Every day | ORAL | Status: DC
  Administered 2018-12-25 – 2018-12-26 (×2): 75 mg via ORAL
  Filled 2018-12-25 (×2): qty 3

## 2018-12-25 MED ORDER — LEVALBUTEROL HCL 0.63 MG/3ML IN NEBU: 0.6300 mg | INHALATION_SOLUTION | Freq: Four times a day (QID) | RESPIRATORY_TRACT | Status: DC | PRN

## 2018-12-25 MED ORDER — ALPRAZOLAM 0.5 MG PO TABS
0.5000 mg | ORAL_TABLET | Freq: Three times a day (TID) | ORAL | Status: DC
  Administered 2018-12-25 – 2018-12-27 (×7): 0.5 mg via ORAL
  Filled 2018-12-25 (×7): qty 1

## 2018-12-25 MED ORDER — POLYETHYLENE GLYCOL 3350 17 G PO PACK: 17.0000 g | PACK | Freq: Every day | ORAL | Status: DC | PRN

## 2018-12-25 MED ORDER — POTASSIUM CHLORIDE CRYS ER 20 MEQ PO TBCR
40.0000 meq | EXTENDED_RELEASE_TABLET | Freq: Two times a day (BID) | ORAL | Status: AC
  Administered 2018-12-25 (×2): 40 meq via ORAL
  Filled 2018-12-25 (×2): qty 2

## 2018-12-25 MED ORDER — MOMETASONE FURO-FORMOTEROL FUM 200-5 MCG/ACT IN AERO
2.0000 | INHALATION_SPRAY | Freq: Two times a day (BID) | RESPIRATORY_TRACT | Status: DC
  Administered 2018-12-25 – 2018-12-27 (×4): 2 via RESPIRATORY_TRACT
  Filled 2018-12-25: qty 8.8

## 2018-12-25 MED ORDER — MAGNESIUM SULFATE 4 GM/100ML IV SOLN
4.0000 g | Freq: Once | INTRAVENOUS | Status: AC
  Administered 2018-12-25: 4 g via INTRAVENOUS
  Filled 2018-12-25: qty 100

## 2018-12-25 MED ORDER — GABAPENTIN 300 MG PO CAPS
300.0000 mg | ORAL_CAPSULE | ORAL | Status: DC
  Administered 2018-12-25 – 2018-12-27 (×5): 300 mg via ORAL
  Filled 2018-12-25 (×5): qty 1

## 2018-12-25 MED ORDER — GABAPENTIN 300 MG PO CAPS
600.0000 mg | ORAL_CAPSULE | Freq: Every day | ORAL | Status: DC
  Administered 2018-12-25 – 2018-12-26 (×2): 600 mg via ORAL
  Filled 2018-12-25 (×2): qty 2

## 2018-12-25 MED ORDER — ENOXAPARIN SODIUM 60 MG/0.6ML ~~LOC~~ SOLN
50.0000 mg | Freq: Every day | SUBCUTANEOUS | Status: DC
  Administered 2018-12-25 – 2018-12-27 (×3): 50 mg via SUBCUTANEOUS
  Filled 2018-12-25 (×3): qty 0.6

## 2018-12-25 MED ORDER — LEVOFLOXACIN IN D5W 750 MG/150ML IV SOLN
750.0000 mg | INTRAVENOUS | Status: DC
  Administered 2018-12-25 – 2018-12-26 (×2): 750 mg via INTRAVENOUS
  Filled 2018-12-25 (×2): qty 150

## 2018-12-25 MED ORDER — SODIUM CHLORIDE 0.9 % IV SOLN
INTRAVENOUS | Status: AC
  Administered 2018-12-25 – 2018-12-26 (×3): via INTRAVENOUS

## 2018-12-25 MED ORDER — ENSURE ENLIVE PO LIQD
237.0000 mL | Freq: Two times a day (BID) | ORAL | Status: DC
  Administered 2018-12-25: 237 mL via ORAL

## 2018-12-25 MED ORDER — ONDANSETRON HCL 4 MG/2ML IJ SOLN: 4.0000 mg | Freq: Four times a day (QID) | INTRAMUSCULAR | Status: DC | PRN

## 2018-12-25 MED ORDER — PANTOPRAZOLE SODIUM 40 MG PO TBEC
40.0000 mg | DELAYED_RELEASE_TABLET | Freq: Every day | ORAL | Status: DC
  Administered 2018-12-25 – 2018-12-27 (×3): 40 mg via ORAL
  Filled 2018-12-25 (×3): qty 1

## 2018-12-25 MED ORDER — GEMFIBROZIL 600 MG PO TABS
600.0000 mg | ORAL_TABLET | Freq: Two times a day (BID) | ORAL | Status: DC
  Administered 2018-12-25 – 2018-12-27 (×5): 600 mg via ORAL
  Filled 2018-12-25 (×5): qty 1

## 2018-12-25 MED ORDER — GLUCERNA SHAKE PO LIQD
237.0000 mL | Freq: Two times a day (BID) | ORAL | Status: DC
  Administered 2018-12-25 – 2018-12-27 (×4): 237 mL via ORAL
  Filled 2018-12-25 (×5): qty 237

## 2018-12-25 MED ORDER — INSULIN ASPART 100 UNIT/ML ~~LOC~~ SOLN
0.0000 [IU] | SUBCUTANEOUS | Status: DC
  Administered 2018-12-25: 5 [IU] via SUBCUTANEOUS
  Administered 2018-12-25 – 2018-12-26 (×2): 3 [IU] via SUBCUTANEOUS
  Administered 2018-12-26: 5 [IU] via SUBCUTANEOUS
  Administered 2018-12-26 (×2): 7 [IU] via SUBCUTANEOUS
  Administered 2018-12-27: 5 [IU] via SUBCUTANEOUS
  Administered 2018-12-27: 3 [IU] via SUBCUTANEOUS
  Administered 2018-12-27: 5 [IU] via SUBCUTANEOUS
  Administered 2018-12-27: 3 [IU] via SUBCUTANEOUS
  Administered 2018-12-27: 5 [IU] via SUBCUTANEOUS

## 2018-12-25 MED ORDER — METHOCARBAMOL 500 MG PO TABS: 1000.0000 mg | ORAL_TABLET | Freq: Every evening | ORAL | Status: DC | PRN

## 2018-12-25 MED ORDER — ACETAMINOPHEN 650 MG RE SUPP: 650.0000 mg | Freq: Four times a day (QID) | RECTAL | Status: DC | PRN

## 2018-12-25 MED ORDER — ACETAMINOPHEN 325 MG PO TABS
650.0000 mg | ORAL_TABLET | Freq: Four times a day (QID) | ORAL | Status: DC | PRN
  Administered 2018-12-25 – 2018-12-26 (×2): 650 mg via ORAL
  Filled 2018-12-25 (×2): qty 2

## 2018-12-25 NOTE — Progress Notes (Signed)
Patient well-known to me, s/p ORIF L distal femur fx.  Was recently seen in outpatient office, and we had ordered CT of distal femur to evaluate extent of healing.  Patient noted to be generally de-conditioned, developing LE contractures, and with h/o not keeping outpatient therapy appts.  At this recent visit,  I agreed to re-start home  PT and requested further eval of L femur fx with CT, which had not yet been performed.  I have ordered CT now as inpatient.  Can be WBAT, but I will f/u with study and determine if changes to plan are required.  I agree that SNF may be better placement for patient as she had been improving originally while at [facility] prior to d/c from SNF to home after original femur surgery.  Micheline Rough, MD [facility] [phone]

## 2018-12-25 NOTE — Progress Notes (Signed)
ED TO INPATIENT HANDOFF REPORT  ED Nurse Name and Phone #: Paul Trettin [phone]  S Name/Age/Gender Sandra Ferrell 58 y.o. female Room/Bed: WA21/WA21  Code Status   Code Status: Prior  Home/SNF/Other Home Patient oriented to: self, place, time and situation Is this baseline? Yes   Triage Complete: Triage complete  Chief Complaint Failure to Thrive  Triage Note Per EMS - Family advised she had a femur fx in August. She has decreased mobility and increased weakness. She has been noncompliant with eating, drinking and medications. Family looking into facility that would be appropriate for her.    Allergies Allergies  Allergen Reactions  . Benazepril Anaphylaxis, Swelling and Other (See Comments)    Angioedema, throat swelling  . Diclofenac Sodium Other (See Comments)    GI bleed  . Fluticasone-Salmeterol Hives, Itching and Swelling    Tongue was swollen  . Nsaids Other (See Comments)    Rectal bleeding  . Penicillins Hives and Rash    Has patient had a PCN reaction causing immediate rash, facial/tongue/throat swelling, SOB or lightheadedness with hypotension: no Has patient had a PCN reaction causing severe rash involving mucus membranes or skin necrosis: no Has patient had a PCN reaction that required hospitalization no Has patient had a PCN reaction occurring within the last 10 years: no If all of the above answers are "NO", then may proceed with Cephalosporin use.   Marland Kitchen Morphine And Related     rash  . Aspirin Other (See Comments)    Burns stomach  . Propoxyphene Other (See Comments)    Darvocet - Headache    Level of Care/Admitting Diagnosis ED Disposition    ED Disposition Condition Comment   Admit  Hospital Area: [facility] [[id number]]  Level of Care: Telemetry [5]  Admit to tele based on following criteria: Other see comments  Comments: sepsis, tachycardia  Diagnosis: Pyelonephritis [623762]  Admitting Physician: Toy Baker  [[id number]]  Attending Physician: Toy Baker [[id number]]  Estimated length of stay: past midnight tomorrow  Certification:: I certify this patient will need inpatient services for at least 2 midnights  PT Class (Do Not Modify): Inpatient [101]  PT Acc Code (Do Not Modify): Private [1]       B Medical/Surgery History Past Medical History:  Diagnosis Date  . Allergic rhinitis, cause unspecified   . Anemia   . Anxiety state, unspecified   . Backache, unspecified   . Calculus of kidney   . Carpal tunnel syndrome, right   . CHF (congestive heart failure) ([place])    unspecified   . Chronic pain syndrome   . Constipation   . COPD (chronic obstructive pulmonary disease) ([place])   . Cough   . Depressive disorder, not elsewhere classified    managed with medications  . Difficulty in walking   . Displaced intertrochanteric fracture of left femur ([place])   . Esophageal reflux   . Extrinsic asthma with exacerbation   . Fibromyalgia   . Heart murmur   . Hyperlipidemia   . Hypertension   . Muscle weakness (generalized)   . Nicotine dependence, cigarettes, uncomplicated   . Osteoarthrosis, unspecified whether generalized or localized, unspecified site   . Other abnormalities of gait and mobility   . Other and unspecified hyperlipidemia   . Other irritable bowel syndrome   . Other screening mammogram   . Personal history of unspecified urinary disorder   . Pneumonia   . Pressure ulcer of sacral region   . Routine general medical  examination at a health care facility   . Routine gynecological examination   . Tobacco use disorder   . Type II or unspecified type diabetes mellitus without mention of complication, not stated as uncontrolled   . Unspecified fall, subsequent encounter   . Unspecified sleep apnea   . Urinary tract infection   . Vaginitis   . Wears glasses   . Wheezing    Past Surgical History:  Procedure Laterality Date  . ANTERIOR CERVICAL DECOMP/DISCECTOMY FUSION N/A  01/19/2015   Procedure: ANTERIOR CERVICAL DECOMPRESSION/DISCECTOMY FUSION 2 LEVELS;  Surgeon: Phylliss Bob, MD;  Location: [facility];  Service: Orthopedics;  Laterality: N/A;  Anterior cervical decompression fusion, cervical 5-6, cervical 6-7 with instrumentation and allograft  . ANTERIOR CERVICAL DECOMP/DISCECTOMY FUSION N/A 05/23/2017   Procedure: ANTERIOR CERVICAL DECOMPRESSION FUSION, CERVICAL 4-5 WITH INSTRUMENTATION AND ALLOGRAFT;  Surgeon: Phylliss Bob, MD;  Location: [facility];  Service: Orthopedics;  Laterality: N/A;  ANTERIOR CERVICAL DECOMPRESSION FUSION, CERVICAL 4-5 WITH INSTRUMENTATION AND ALLOGRAFT; REQUEST 2 HOURS AND FLIP ROOM  . APPENDECTOMY  1973  . BACK SURGERY    . CHOLECYSTECTOMY  08/2006  . COLONOSCOPY    . HARDWARE REMOVAL Left 08/20/2018   Procedure: HARDWARE REMOVAL FROM LEFT KNEE;  Surgeon: Milly Jakob, MD;  Location: [facility];  Service: Orthopedics;  Laterality: Left;  . ORIF FEMUR FRACTURE Left 06/29/2018   Procedure: OPEN REDUCTION INTERNAL FIXATION (ORIF) DISTAL FEMUR FRACTURE;  Surgeon: Milly Jakob, MD;  Location: [facility];  Service: Orthopedics;  Laterality: Left;  . TRANSTHORACIC ECHOCARDIOGRAM  02/2011   mild LVH, nl EF, mild diastolic dysfunction, no wall motion abnl     A IV Location/Drains/Wounds Patient Lines/Drains/Airways Status   Active Line/Drains/Airways    Name:   Placement date:   Placement time:   Site:   Days:   Peripheral IV 12/24/18 Left;Posterior Forearm   12/24/18    1947    Forearm   1   Peripheral IV 12/24/18 Right Wrist   12/24/18    2030    Wrist   1   External Urinary Catheter   06/29/18    0130    -   179   Incision (Closed) 01/19/15 Neck Other (Comment)   01/19/15    1855     1436   Incision (Closed) 04/18/16 Back Other (Comment)   04/18/16    1617     981   Incision (Closed) 05/23/17 Neck Other (Comment)   05/23/17    1203     581   Incision (Closed) 06/29/18 Leg Left   06/29/18    1237     179   Incision (Closed) 08/20/18 Knee Left    08/20/18    0902     127          Intake/Output Last 24 hours  Intake/Output Summary (Last 24 hours) at 12/25/2018 0043 Last data filed at 12/24/2018 2304 Gross per 24 hour  Intake 1000 ml  Output -  Net 1000 ml    Labs/Imaging Results for orders placed or performed during the hospital encounter of 12/24/18 (from the past 48 hour(s))  Comprehensive metabolic panel     Status: Abnormal   Collection Time: 12/24/18  7:47 PM  Result Value Ref Range   Sodium 129 (L) 135 - 145 mmol/L   Potassium 3.3 (L) 3.5 - 5.1 mmol/L   Chloride 97 (L) 98 - 111 mmol/L   CO2 23 22 - 32 mmol/L   Glucose, Bld 243 (H) 70 -  99 mg/dL   BUN 13 6 - 20 mg/dL   Creatinine, Ser 0.77 0.44 - 1.00 mg/dL   Calcium 7.6 (L) 8.9 - 10.3 mg/dL   Total Protein 7.4 6.5 - 8.1 g/dL   Albumin 2.4 (L) 3.5 - 5.0 g/dL   AST 8 (L) 15 - 41 U/L   ALT 7 0 - 44 U/L   Alkaline Phosphatase 81 38 - 126 U/L   Total Bilirubin 0.5 0.3 - 1.2 mg/dL   GFR calc non Af Amer >60 >60 mL/min   GFR calc Af Amer >60 >60 mL/min   Anion gap 9 5 - 15    Comment: Performed at [facility], [place] [address], [place], [place] [address]  Lactic acid, plasma     Status: None   Collection Time: 12/24/18  7:47 PM  Result Value Ref Range   Lactic Acid, Venous 0.7 0.5 - 1.9 mmol/L    Comment: Performed at [facility], [place] [address], [place], [place] [address]  CBC with Differential     Status: Abnormal   Collection Time: 12/24/18  7:47 PM  Result Value Ref Range   WBC 20.1 (H) 4.0 - 10.5 K/uL   RBC 3.45 (L) 3.87 - 5.11 MIL/uL   Hemoglobin 9.4 (L) 12.0 - 15.0 g/dL   HCT 31.3 (L) 36.0 - 46.0 %   MCV 90.7 80.0 - 100.0 fL   MCH 27.2 26.0 - 34.0 pg   MCHC 30.0 30.0 - 36.0 g/dL   RDW 14.5 11.5 - 15.5 %   Platelets 392 150 - 400 K/uL   nRBC 0.0 0.0 - 0.2 %   Neutrophils Relative % 78 %   Neutro Abs 15.8 (H) 1.7 - 7.7 K/uL   Lymphocytes Relative 10 %   Lymphs Abs 2.0 0.7 - 4.0 K/uL   Monocytes Relative 11 %    Monocytes Absolute 2.1 (H) 0.1 - 1.0 K/uL   Eosinophils Relative 0 %   Eosinophils Absolute 0.0 0.0 - 0.5 K/uL   Basophils Relative 0 %   Basophils Absolute 0.0 0.0 - 0.1 K/uL   Immature Granulocytes 1 %   Abs Immature Granulocytes 0.15 (H) 0.00 - 0.07 K/uL    Comment: Performed at [facility], [place] [address], [place], [place] [address]  Protime-INR     Status: Abnormal   Collection Time: 12/24/18  7:47 PM  Result Value Ref Range   Prothrombin Time 15.7 (H) 11.4 - 15.2 seconds   INR 1.26     Comment: Performed at [facility], [place] [address], [place], [place] [address]  I-Stat beta hCG blood, ED     Status: None   Collection Time: 12/24/18  7:55 PM  Result Value Ref Range   I-stat hCG, quantitative <5.0 <5 mIU/mL   Comment 3            Comment:   GEST. AGE      CONC.  (mIU/mL)   <=1 WEEK        5 - 50     2 WEEKS       50 - 500     3 WEEKS       100 - 10,000     4 WEEKS     1,000 - 30,000        FEMALE AND NON-PREGNANT FEMALE:     LESS THAN 5 mIU/mL   Urinalysis, Routine w reflex microscopic     Status: Abnormal  Collection Time: 12/24/18  8:08 PM  Result Value Ref Range   Color, Urine YELLOW YELLOW   APPearance TURBID (A) CLEAR   Specific Gravity, Urine 1.019 1.005 - 1.030   pH 5.0 5.0 - 8.0   Glucose, UA NEGATIVE NEGATIVE mg/dL   Hgb urine dipstick SMALL (A) NEGATIVE   Bilirubin Urine NEGATIVE NEGATIVE   Ketones, ur NEGATIVE NEGATIVE mg/dL   Protein, ur 100 (A) NEGATIVE mg/dL   Nitrite NEGATIVE NEGATIVE   Leukocytes,Ua LARGE (A) NEGATIVE   RBC / HPF 6-10 0 - 5 RBC/hpf   WBC, UA >50 (H) 0 - 5 WBC/hpf   Bacteria, UA MANY (A) NONE SEEN   Squamous Epithelial / LPF 6-10 0 - 5   WBC Clumps PRESENT     Comment: Performed at [facility], [place] [address] [place], [place] [address]  Creatinine, urine, random     Status: None   Collection Time: 12/24/18 10:19 PM  Result Value Ref Range   Creatinine, Urine 171.74  mg/dL    Comment: Performed at  Hospital, [place] [address] [place], [place] [address]  Sodium, urine, random     Status: None   Collection Time: 12/24/18 10:19 PM  Result Value Ref Range   Sodium, Ur 35 mmol/L    Comment: Performed at [facility], [place] [address] [place], [place] [address]   Dg Chest 2 View  Result Date: 12/24/2018 CLINICAL DATA:  58 year old female with suspected sepsis. EXAM: CHEST - 2 VIEW COMPARISON:  Chest radiographs 06/28/2018 and earlier. FINDINGS: Semi upright AP and lateral views. Stable lung volumes and mediastinal contours. Cardiac size at the upper limits of normal. Visualized tracheal air column is within normal limits. Both lungs appear clear. No pneumothorax or pleural effusion. Multilevel prior cervical spine fusion. No acute osseous abnormality identified. Negative visible bowel gas pattern. IMPRESSION: No acute cardiopulmonary abnormality. Electronically Signed   By: Genevie Ann M.D.   On: 12/24/2018 20:29   Ct Abdomen Pelvis W Contrast  Result Date: 12/24/2018 CLINICAL DATA:  Abdominal pain with fever EXAM: CT ABDOMEN AND PELVIS WITH CONTRAST TECHNIQUE: Multidetector CT imaging of the abdomen and pelvis was performed using the standard protocol following bolus administration of intravenous contrast. CONTRAST:  140mL ISOVUE-300 IOPAMIDOL (ISOVUE-300) INJECTION 61% COMPARISON:  09/24/2017 FINDINGS: Lower chest: The included heart is borderline enlarged. No pericardial effusion. Subsegmental atelectasis and/or scarring is noted at each lung base. Hepatobiliary: No focal liver abnormality is seen. Status post cholecystectomy. No biliary dilatation. Pancreas: Unremarkable. No pancreatic ductal dilatation or surrounding inflammatory changes. Spleen: Normal in size without focal abnormality. Adrenals/Urinary Tract: Normal bilateral adrenal glands and right kidney. In homogeneous enhancement of the left kidney with perinephric fat  stranding in a pattern suspicious for pyelonephritis and lobar nephronia. The urinary bladder is thick-walled in appearance with mucosal enhancement consistent with cystitis. Stomach/Bowel: Stomach is within normal limits. Appendix is surgically absent. No evidence of bowel wall thickening, distention, or inflammatory changes. Vascular/Lymphatic: Aortoiliac atherosclerosis. No lymphadenopathy. Reproductive: Uterus and bilateral adnexa are unremarkable. Other: No abdominal wall hernia or abnormality. No abdominopelvic ascites. Musculoskeletal: Thoracolumbar spondylosis with L4-5 PLIF procedure. IMPRESSION: 1. Heterogeneous enhancement of the left kidney with perinephric fat stranding suspicious for pyelonephritis and lobar nephronia. 2. Thick-walled appearance of the urinary bladder with mucosal enhancement consistent with cystitis. 3. Thoracolumbar spondylosis with L4-5 PLIF procedure. Electronically Signed   By: Ashley Royalty M.D.   On: 12/24/2018 22:43    Pending Labs [employer] (  From admission, onward)    Start     Ordered   12/25/18 0500  Prealbumin  Tomorrow morning,   R     12/24/18 2314   12/24/18 2315  Osmolality, urine  Once,   R     12/24/18 2314   12/24/18 2315  Osmolality  Add-on,   R     12/24/18 2314   12/24/18 2315  Magnesium  Add-on,   R     12/24/18 2314   12/24/18 2315  Phosphorus  Add-on,   R     12/24/18 2314   12/24/18 2059  Urine culture  ONCE - STAT,   STAT     12/24/18 2058   12/24/18 1947  Lactic acid, plasma  Now then every 2 hours,   STAT     12/24/18 1946   12/24/18 1947  Culture, blood (Routine x 2)  BLOOD CULTURE X 2,   STAT     12/24/18 1946   Signed and Held  Hemoglobin A1c  Once,   R    Comments:  To assess prior glycemic control    Signed and Held   Signed and Held  Magnesium  Tomorrow morning,   R    Comments:  Call MD if <1.5    Signed and Held   Signed and Held  Phosphorus  Tomorrow morning,   R     Signed and Held   Signed and Held  TSH  Once,    R    Comments:  Cancel if already done within 1 month and notify MD    Signed and Held   Signed and Held  Comprehensive metabolic panel  Once,   R    Comments:  Cal MD for K<3.5 or >5.0    Signed and Held   Signed and Held  CBC  Once,   R    Comments:  Call for hg <8.0    Signed and Held          Vitals/Pain Today's Vitals   12/24/18 2315 12/24/18 2330 12/25/18 0000 12/25/18 0030  BP:  (!) 107/54 (!) 99/58 (!) 110/55  Pulse:  100 99 98  Resp:  (!) 21 18 19   Temp:      TempSrc:      SpO2:  92% 94% (!) 89%  Height:      PainSc: 0-No pain       Isolation Precautions No active isolations  Medications Medications  levofloxacin (LEVAQUIN) IVPB 750 mg (750 mg Intravenous New Bag/Given 12/24/18 2330)  sodium chloride flush (NS) 0.9 % injection 3 mL (3 mLs Intravenous Given 12/24/18 2145)  acetaminophen (TYLENOL) tablet 650 mg (650 mg Oral Given 12/24/18 2126)  morphine 4 MG/ML injection 4 mg (4 mg Intravenous Given 12/24/18 2126)  diphenhydrAMINE (BENADRYL) injection 25 mg (25 mg Intravenous Given 12/24/18 2143)  sodium chloride 0.9 % bolus 1,000 mL (0 mLs Intravenous Stopped 12/24/18 2304)  iopamidol (ISOVUE-300) 61 % injection 100 mL (100 mLs Intravenous Contrast Given 12/24/18 2212)    Mobility non-ambulatory Moderate fall risk   Focused Assessments     R Recommendations: See Admitting Provider Note  Report given to:   Additional Notes:

## 2018-12-25 NOTE — Progress Notes (Signed)
Initial Nutrition Assessment  DOCUMENTATION CODES:   Morbid obesity  INTERVENTION:    Glucerna Shake po BID, each supplement provides 220 kcal and 10 grams of protein  MVI daily  NUTRITION DIAGNOSIS:   Inadequate oral intake related to nausea as evidenced by per patient/family report.  GOAL:   Patient will meet greater than or equal to 90% of their needs  MONITOR:   PO intake, Supplement acceptance, Weight trends, Labs  REASON FOR ASSESSMENT:   Consult Assessment of nutrition requirement/status  ASSESSMENT:   Patient with PMH significant for anemia, chronic back pain, kidney stones, CHF, COPD, HLD, HTN, DM, and recent distal femur fracture. Presents this admission with sepsis secondary to pyelonephritis.    Pt endorses a loss in appetite two weeks PTA due to nausea. During this time period she consumed two biscuits from [employer] and a roast beef slider with potato cakes from [employer]. She attempted to eat different food options from other fast foods restaurants but could only tolerate bites. She is currently on a carb modified diet and consumed 100% of her lunch (pasta with meat). Do not suspect pt is malnourished at this time. Recommend pt have further low sodium education as she seems to be none compliant. RD unable to provide today due to pt needing nursing care.   Pt reports a UBW of 220 lb and a recent wt loss of 30 lbs. Pt shows to have weighed 237 lb on 07/03/18 and 197 lb this admission. Unable to determine how much is fluid fluctuation versus dry wt loss given history of CHF.  Nutrition-Focused physical exam completed. Pt has not mobilized since her femur fracture s/p ORIF in August.   Medications reviewed and include: SS novolog, Lantus Labs reviewed: Na 134 (L) K 3.1 (L) Mg 1.3 (L) CBG 767-209  NUTRITION - FOCUSED PHYSICAL EXAM:    Most Recent Value  Orbital Region  No depletion  Upper Arm Region  No depletion  Thoracic and Lumbar Region  Unable to assess   Buccal Region  No depletion  Temple Region  No depletion  Clavicle Bone Region  No depletion  Clavicle and Acromion Bone Region  No depletion  Scapular Bone Region  Unable to assess  Dorsal Hand  No depletion  Patellar Region  No depletion  Anterior Thigh Region  No depletion  Posterior Calf Region  No depletion  Edema (RD Assessment)  Mild  Hair  Reviewed  Eyes  Reviewed  Mouth  Reviewed  Skin  Reviewed  Nails  Reviewed     Diet Order:   Diet Order            Diet Carb Modified Fluid consistency: Thin; Room service appropriate? Yes  Diet effective now              EDUCATION NEEDS:   Education needs have been addressed  Skin:  Skin Assessment: Reviewed RN Assessment  Last BM:  2/19  Height:   Ht Readings from Last 1 Encounters:  12/24/18 4\' 10"  (1.473 m)    Weight:   Wt Readings from Last 1 Encounters:  12/25/18 104.3 kg    Ideal Body Weight:  43.2 kg  BMI:  Body mass index is 48.07 kg/m.  Estimated Nutritional Needs:   Kcal:  1500-1700 kcal  Protein:  75-90 grams  Fluid:  >/= 1.5 L/day   Mariana Single RD, LDN Clinical Nutrition Pager # - [phone]

## 2018-12-25 NOTE — Evaluation (Signed)
Occupational Therapy Evaluation Patient Details Name: Sandra Ferrell MRN: [id number] DOB: Feb 23, 1961 Today's Date: 12/25/2018    History of Present Illness 58 y.o. female with medical history significant of anemia chronic back pain, kidney stones, CHF, COPD, asthma fibromyalgia hyperlipidemia hypertension, DM2 , recent distal femur fracture s/p ORIF 06/29/2018 with hardware removal 08/20/2018.    Clinical Impression   Pt admitted with the above diagnoses and presents with below problem list. Pt will benefit from continued acute OT to address the below listed deficits and maximize independence with basic ADLs prior to d/c to venue below. PTA pt was making very slow progress with her rehabilitation from her ORIF in August 2019. Pt was currently at most pivoting to a [facility] for toilet transfers and LB ADLs with assistance from spouse. Pt presents very deconditioned and self-limiting, needing max encouragement to participate in session minimally. She reports she does not feel well. Spoke at length with pt and spouse on role of therapy, benefits of mobilizing even when feeling poorly and risks of immobility.Pt seems open to recommendation for ST rehab at SNF. Spouse became angry during this discussion,  "She's not going anywhere but home! I already have all the stuff and a ramp! I can do just as good for her at home as they can there (SNF)!" Pt reports having a bad experience at last SNF she was at. Mentioned possibility of finding a different SNF this time. Pt currently mod A +2 for standing EOB and for bed mobility.      Follow Up Recommendations  SNF    Equipment Recommendations  Other (comment)(defer to next venue)    Recommendations for Other Services       Precautions / Restrictions Precautions Precautions: Fall      Mobility Bed Mobility Overal bed mobility: Needs Assistance Bed Mobility: Supine to Sit;Sit to Supine     Supine to sit: Mod assist;HOB elevated;+2 for physical  assistance Sit to supine: Mod assist;+2 for physical assistance   General bed mobility comments: assist to advance BLE and powerup trunk  Transfers Overall transfer level: Needs assistance Equipment used: Rolling walker (2 wheeled) Transfers: Sit to/from Stand Sit to Stand: Mod assist;+2 physical assistance         General transfer comment: successful on third attempt. Max encouragement to achieve standing postion. assist to powerup and transition out of trunk flexion.     Balance Overall balance assessment: Needs assistance Sitting-balance support: Bilateral upper extremity supported;Feet supported Sitting balance-Leahy Scale: Fair     Standing balance support: Bilateral upper extremity supported Standing balance-Leahy Scale: Poor Standing balance comment: rw and some steadying assist                           ADL either performed or assessed with clinical judgement   ADL Overall ADL's : Needs assistance/impaired Eating/Feeding: Set up;Sitting   Grooming: Min guard;Sitting   Upper Body Bathing: Sitting;Set up;Min guard   Lower Body Bathing: Moderate assistance;+2 for physical assistance;Sit to/from stand   Upper Body Dressing : Set up;Sitting;Min guard   Lower Body Dressing: Moderate assistance;+2 for physical assistance;Sit to/from stand                 General ADL Comments: With encouragement pt completed bed mobility, sat EOB a few minutes. Attempted standing from EOB with success on third attempt. Pt adamantly declined further mobility.     Vision         Perception  Praxis      Pertinent Vitals/Pain Pain Assessment: Faces Faces Pain Scale: Hurts even more Pain Location: L knee Pain Descriptors / Indicators: Sore Pain Intervention(s): Monitored during session;Repositioned     Hand Dominance     Extremity/Trunk Assessment Upper Extremity Assessment Upper Extremity Assessment: Generalized weakness   Lower Extremity  Assessment Lower Extremity Assessment: Defer to PT evaluation       Communication Communication Communication: No difficulties   Cognition Arousal/Alertness: Awake/alert Behavior During Therapy: Flat affect;Anxious Overall Cognitive Status: Within Functional Limits for tasks assessed                                 General Comments: some slow processing, perseveration noted. tangental    General Comments  Spouse presenting and frequently answering for pt. Spouse became upset during discussion of SNF, "She's not going anywhere but home! I already have all the stuff and a ramp! I can do just as good for her at home as they can there (SNF)!" Later, discussed therapy recommendation of SNF directly with pt and she seems open to the idea. We discussed the benefits of mobility and risks of immobility even when she is feeling poorly. Of note, pt reporting pain in L knee but noted to keep her RLE out on first attempt to stand (increasing WB on LLE).    Exercises     Shoulder Instructions      Home Living Family/patient expects to be discharged to:: Unsure Living Arrangements: Spouse/significant other Available Help at Discharge: Family Type of Home: House                                  Prior Functioning/Environment Level of Independence: Needs assistance  Gait / Transfers Assistance Needed: transfers to [facility], able to stand for grooming task at sink Reports she spends most of her time in bed.  ADL's / Homemaking Assistance Needed: assist for LB ADLs and transfers            OT Problem List: Decreased strength;Decreased activity tolerance;Impaired balance (sitting and/or standing);Decreased knowledge of use of DME or AE;Decreased knowledge of precautions;Pain;Obesity      OT Treatment/Interventions: Self-care/ADL training;Therapeutic exercise;DME and/or AE instruction;Therapeutic activities;Patient/family education;Balance training    OT Goals(Current  goals can be found in the care plan section) Acute Rehab OT Goals Patient Stated Goal: feel better OT Goal Formulation: With patient/family Time For Goal Achievement: 01/08/19 Potential to Achieve Goals: Good ADL Goals Pt Will Perform Grooming: with set-up;sitting Pt Will Perform Upper Body Bathing: with set-up;sitting Pt Will Perform Lower Body Bathing: with min assist;sit to/from stand Pt Will Perform Upper Body Dressing: with set-up;sitting Pt Will Perform Lower Body Dressing: with min assist;sit to/from stand Pt Will Transfer to Toilet: with min assist;ambulating Pt Will Perform Toileting - Clothing Manipulation and hygiene: with min guard assist;sit to/from stand Additional ADL Goal #1: Pt will complete bed mobility at min A level to prepare for OOB ADLs.  OT Frequency: Min 3X/week   Barriers to D/C:            Co-evaluation PT/OT/SLP Co-Evaluation/Treatment: Yes Reason for Co-Treatment: For patient/therapist safety;To address functional/ADL transfers   OT goals addressed during session: ADL's and self-care;Strengthening/ROM;Proper use of Adaptive equipment and DME      AM-PAC OT "6 Clicks" Daily Activity     Outcome Measure Help from another person  eating meals?: None Help from another person taking care of personal grooming?: A Little Help from another person toileting, which includes using toliet, bedpan, or urinal?: A Lot Help from another person bathing (including washing, rinsing, drying)?: A Lot Help from another person to put on and taking off regular upper body clothing?: A Little Help from another person to put on and taking off regular lower body clothing?: A Lot 6 Click Score: 16   End of Session Equipment Utilized During Treatment: Rolling walker;Gait belt  Activity Tolerance: Patient limited by fatigue Patient left: in bed;with call bell/phone within reach;with bed alarm set;with family/visitor present  OT Visit Diagnosis: Unsteadiness on feet  (R26.81);Other abnormalities of gait and mobility (R26.89);Muscle weakness (generalized) (M62.81);Pain Pain - Right/Left: Left Pain - part of body: Knee;Leg                Time: 1110-1143 OT Time Calculation (min): 33 min Charges:  OT General Charges $OT Visit: 1 Visit OT Evaluation $OT Eval Moderate Complexity: [place], OT Acute Rehabilitation Services Pager: [phone] Office: [phone]   Hortencia Pilar 12/25/2018, 12:24 PM

## 2018-12-25 NOTE — Progress Notes (Addendum)
Pharmacy Antibiotic Note  Sandra Ferrell is a 58 y.o. female admitted on 12/24/2018 with UTI.  Pharmacy has been consulted for levaquin dosing.  Plan: Levaquin 750 mg IV q24h Rx adjusted Lovenox to 50 mg daily for DVT prophylaxis (BMI = 48) F/u scr/cultures  Height: 4\' 10"  (147.3 cm) Weight: 230 lb (104.3 kg) IBW/kg (Calculated) : 40.9  Temp (24hrs), Avg:100.7 F (38.2 C), Min:98.8 F (37.1 C), Max:102.8 F (39.3 C)  Recent Labs  Lab 12/24/18 1947  WBC 20.1*  CREATININE 0.77  LATICACIDVEN 0.7    Estimated Creatinine Clearance: 80.2 mL/min (by C-G formula based on SCr of 0.77 mg/dL).    Allergies  Allergen Reactions  . Benazepril Anaphylaxis, Swelling and Other (See Comments)    Angioedema, throat swelling  . Diclofenac Sodium Other (See Comments)    GI bleed  . Fluticasone-Salmeterol Hives, Itching and Swelling    Tongue was swollen  . Nsaids Other (See Comments)    Rectal bleeding  . Penicillins Hives and Rash    Has patient had a PCN reaction causing immediate rash, facial/tongue/throat swelling, SOB or lightheadedness with hypotension: no Has patient had a PCN reaction causing severe rash involving mucus membranes or skin necrosis: no Has patient had a PCN reaction that required hospitalization no Has patient had a PCN reaction occurring within the last 10 years: no If all of the above answers are "NO", then may proceed with Cephalosporin use.   Marland Kitchen Morphine And Related     rash  . Aspirin Other (See Comments)    Burns stomach  . Propoxyphene Other (See Comments)    Darvocet - Headache    Antimicrobials this admission: 2/19 levaquin >>    >>   Dose adjustments this admission:   Microbiology results:  BCx:   UCx:    Sputum:    MRSA PCR:   Thank you for allowing pharmacy to be a part of this patient's care.  Dorrene German 12/25/2018 2:50 AM

## 2018-12-25 NOTE — Progress Notes (Signed)
PROGRESS NOTE  Sandra Ferrell FTD:[id number] DOB: 08/01/1961 DOA: 12/24/2018 PCP: Jinny Sanders, MD  HPI/Recap of past 24 hours: Sandra Ferrell is a 58 y.o. female with medical history significant of anemia chronic back pain, kidney stones, CHF, COPD, asthma fibromyalgia hyperlipidemia hypertension, DM2 , recent distal femur fracture    Presented with   persistent left leg pain decreased p.o. intake.  She had a femur fracture in August and been treated with ORIF by Dr. Grandville Silos but continued to have significant pain.  Has decreased p.o. intake and generalized fatigue.  2 weeks of dysuria.  No associated chest pain or shortness of breath.  No nausea vomiting or diarrhea. Reports main problem she has not had a good appetite, she have had a BM yesterday.   Since her surgery she have not been able to ambulate she have had a lot of nausea Yesterday she had to travel to [facility] by EMS.  12/25/2018: Patient seen and examined with her husband at bedside.  Reports persistent pain in her left flank and generalized weakness.  Assessment/Plan: Active Problems:   Diabetes mellitus with no complication (HCC)   Hyperlipidemia LDL goal <100   GERD   Fibromyalgia   Sleep apnea   Essential hypertension, benign   COPD, moderate (HCC)   Pyelonephritis  Sepsis secondary to acute left pyelonephritis Presented with leukocytosis and fever with T-max of 102.8, tachycardia and tachypnea Left-sided stranding noted on CT abdomen pelvis Currently on Levaquin due to penicillin allergy Continue IV Levaquin Continue gentle IV hydration normal saline at 75 cc/h Blood cultures x 2 and urine culture in process, continue to follow  Chronic normocytic anemia Hemoglobin at baseline appears to be 9 Hemoglobin dropped this morning to 7.7, suspect dilutional from IV fluid No sign of overt bleeding Continue to closely monitor Repeat CBC in the morning  Hyponatremia Sodium 134 from 129 on  presentation Continue gentle IV fluid hydration Repeat BMP in the morning  Hypokalemia Potassium 3.1 Repleted with p.o. potassium 40 mEq x 2 Repeat BMP in the morning  Hypomagnesemia Magnesium 1.2 Repleted with IV magnesium 4 g once  Ambulatory dysfunction/physical debility PT to assess For precautions  Hyperlipidemia LDL goal <100 stable continue home medications   . GERD stable continue home medications . Fibromyalgia stable continue home medications . Sleep apnea not on CPAP . Essential hypertension, benign allow some permissive hypertension for tonight some soft blood pressures in the ER will resume when able . COPD, moderate ([place]) currently stable continue home medications  Risks: Patient requires at least 2 midnight due to sepsis secondary to left acute pyelonephritis with ongoing treatment.  High risk for decompensation due to multiple comorbidities.   DVT prophylaxis:  lovenox  Code Status:  FULL CODE  as per patient  I had personally discussed CODE STATUS with patient and family    Family Communication:    Her husband at bedside.  All questions were answered to his satisfaction.    Disposition Plan:  Home versus SNF when hemodynamically stable                                    Consults called: none    Objective: Vitals:   12/25/18 0228 12/25/18 0236 12/25/18 0618 12/25/18 1035  BP: (!) 105/48  (!) 126/55 (!) 124/59  Pulse: 94  (!) 101 94  Resp:   18 18  Temp: 98.8 F (37.1  C)  100.1 F (37.8 C) 99 F (37.2 C)  TempSrc: Oral  Oral Oral  SpO2: 95%  95% 95%  Weight: 89.2 kg 104.3 kg    Height:        Intake/Output Summary (Last 24 hours) at 12/25/2018 1714 Last data filed at 12/25/2018 1600 Gross per 24 hour  Intake 2430 ml  Output 0 ml  Net 2430 ml   Filed Weights   12/25/18 0228 12/25/18 0236  Weight: 89.2 kg 104.3 kg    Exam:  . General: 58 y.o. year-old female well developed well nourished in no acute distress.  Alert and oriented  x3. . Cardiovascular: Regular rate and rhythm with no rubs or gallops.  No thyromegaly or JVD noted.   Marland Kitchen Respiratory: Clear to auscultation with no wheezes or rales. Good inspiratory effort. . Abdomen: Soft nontender nondistended with normal bowel sounds x4 quadrants. . Musculoskeletal: Trace lower extremity edema. 2/4 pulses in all 4 extremities. Marland Kitchen Psychiatry: Mood is appropriate for condition and setting   Data Reviewed: CBC: Recent Labs  Lab 12/24/18 1947 12/25/18 0407  WBC 20.1* 18.2*  NEUTROABS 15.8*  --   HGB 9.4* 7.7*  HCT 31.3* 25.8*  MCV 90.7 91.5  PLT 392 655   Basic Metabolic Panel: Recent Labs  Lab 12/24/18 1947 12/25/18 0407  NA 129* 134*  K 3.3* 3.1*  CL 97* 102  CO2 23 24  GLUCOSE 243* 139*  BUN 13 12  CREATININE 0.77 0.75  CALCIUM 7.6* 7.5*  MG  --  1.2*  1.3*  PHOS  --  3.1  3.1   GFR: Estimated Creatinine Clearance: 80.2 mL/min (by C-G formula based on SCr of 0.75 mg/dL). Liver Function Tests: Recent Labs  Lab 12/24/18 1947 12/25/18 0407  AST 8* 8*  ALT 7 7  ALKPHOS 81 82  BILITOT 0.5 0.4  PROT 7.4 6.5  ALBUMIN 2.4* 2.0*   No results for input(s): LIPASE, AMYLASE in the last 168 hours. No results for input(s): AMMONIA in the last 168 hours. Coagulation Profile: Recent Labs  Lab 12/24/18 1947  INR 1.26   Cardiac Enzymes: No results for input(s): CKTOTAL, CKMB, CKMBINDEX, TROPONINI in the last 168 hours. BNP (last 3 results) No results for input(s): PROBNP in the last 8760 hours. HbA1C: No results for input(s): HGBA1C in the last 72 hours. CBG: Recent Labs  Lab 12/25/18 0430 12/25/18 0724 12/25/18 1316 12/25/18 1653  GLUCAP 116* 119* 285* 280*   Lipid Profile: No results for input(s): CHOL, HDL, LDLCALC, TRIG, CHOLHDL, LDLDIRECT in the last 72 hours. Thyroid Function Tests: Recent Labs    12/25/18 0407  TSH 1.476   Anemia Panel: No results for input(s): VITAMINB12, FOLATE, FERRITIN, TIBC, IRON, RETICCTPCT in the last  72 hours. Urine analysis:    Component Value Date/Time   COLORURINE YELLOW 12/24/2018 2008   APPEARANCEUR TURBID (A) 12/24/2018 2008   LABSPEC 1.019 12/24/2018 2008   PHURINE 5.0 12/24/2018 2008   GLUCOSEU NEGATIVE 12/24/2018 2008   GLUCOSEU NEGATIVE 12/26/2010 1044   HGBUR SMALL (A) 12/24/2018 2008   HGBUR large 04/10/2010 [place] NEGATIVE 12/24/2018 2008   BILIRUBINUR neg 12/12/2016 0804   KETONESUR NEGATIVE 12/24/2018 2008   PROTEINUR 100 (A) 12/24/2018 2008   UROBILINOGEN negative 12/12/2016 0804   UROBILINOGEN 0.2 01/12/2015 1037   NITRITE NEGATIVE 12/24/2018 2008   LEUKOCYTESUR LARGE (A) 12/24/2018 2008   Sepsis Labs: @LABRCNTIP (procalcitonin:4,lacticidven:4)  ) Recent Results (from the past 240 hour(s))  Culture, blood (Routine x  2)     Status: None (Preliminary result)   Collection Time: 12/24/18  7:47 PM  Result Value Ref Range Status   Specimen Description   Final    BLOOD BLOOD LEFT FOREARM Performed at [facility] [address], [place], [place] [address]    Special Requests   Final    BOTTLES DRAWN AEROBIC AND ANAEROBIC Blood Culture adequate volume Performed at [facility] [address], [place], [place] [address]    Culture   Final    NO GROWTH < 24 HOURS Performed at [facility] [address], [place], [place] [address]    Report Status PENDING  Incomplete  Culture, blood (Routine x 2)     Status: None (Preliminary result)   Collection Time: 12/24/18  7:52 PM  Result Value Ref Range Status   Specimen Description   Final    BLOOD RIGHT WRIST Performed at [facility] [address], [place], [place] [address]    Special Requests   Final    BOTTLES DRAWN AEROBIC AND ANAEROBIC Blood Culture results may not be optimal due to an excessive volume of blood received in culture bottles Performed at [facility] [address], [place], [place] [address]     Culture   Final    NO GROWTH < 24 HOURS Performed at [facility] [address], [place], [place] [address]    Report Status PENDING  Incomplete      Studies: Dg Chest 2 View  Result Date: 12/24/2018 CLINICAL DATA:  58 year old female with suspected sepsis. EXAM: CHEST - 2 VIEW COMPARISON:  Chest radiographs 06/28/2018 and earlier. FINDINGS: Semi upright AP and lateral views. Stable lung volumes and mediastinal contours. Cardiac size at the upper limits of normal. Visualized tracheal air column is within normal limits. Both lungs appear clear. No pneumothorax or pleural effusion. Multilevel prior cervical spine fusion. No acute osseous abnormality identified. Negative visible bowel gas pattern. IMPRESSION: No acute cardiopulmonary abnormality. Electronically Signed   By: Genevie Ann M.D.   On: 12/24/2018 20:29   Ct Abdomen Pelvis W Contrast  Result Date: 12/24/2018 CLINICAL DATA:  Abdominal pain with fever EXAM: CT ABDOMEN AND PELVIS WITH CONTRAST TECHNIQUE: Multidetector CT imaging of the abdomen and pelvis was performed using the standard protocol following bolus administration of intravenous contrast. CONTRAST:  170mL ISOVUE-300 IOPAMIDOL (ISOVUE-300) INJECTION 61% COMPARISON:  09/24/2017 FINDINGS: Lower chest: The included heart is borderline enlarged. No pericardial effusion. Subsegmental atelectasis and/or scarring is noted at each lung base. Hepatobiliary: No focal liver abnormality is seen. Status post cholecystectomy. No biliary dilatation. Pancreas: Unremarkable. No pancreatic ductal dilatation or surrounding inflammatory changes. Spleen: Normal in size without focal abnormality. Adrenals/Urinary Tract: Normal bilateral adrenal glands and right kidney. In homogeneous enhancement of the left kidney with perinephric fat stranding in a pattern suspicious for pyelonephritis and lobar nephronia. The urinary bladder is thick-walled in appearance with mucosal enhancement consistent with  cystitis. Stomach/Bowel: Stomach is within normal limits. Appendix is surgically absent. No evidence of bowel wall thickening, distention, or inflammatory changes. Vascular/Lymphatic: Aortoiliac atherosclerosis. No lymphadenopathy. Reproductive: Uterus and bilateral adnexa are unremarkable. Other: No abdominal wall hernia or abnormality. No abdominopelvic ascites. Musculoskeletal: Thoracolumbar spondylosis with L4-5 PLIF procedure. IMPRESSION: 1. Heterogeneous enhancement of the left kidney with perinephric fat stranding suspicious for pyelonephritis and lobar nephronia. 2. Thick-walled appearance of the urinary bladder with mucosal enhancement consistent with cystitis. 3. Thoracolumbar spondylosis with L4-5  PLIF procedure. Electronically Signed   By: Ashley Royalty M.D.   On: 12/24/2018 22:43    Scheduled Meds: . ALPRAZolam  0.5 mg Oral TID  . amitriptyline  75 mg Oral QHS  . enoxaparin (LOVENOX) injection  50 mg Subcutaneous Daily  . feeding supplement (GLUCERNA SHAKE)  237 mL Oral BID BM  . gabapentin  300 mg Oral 2 times per day  . gabapentin  600 mg Oral QHS  . gemfibrozil  600 mg Oral BID AC  . insulin aspart  0-9 Units Subcutaneous Q4H  . insulin glargine  15 Units Subcutaneous QHS  . mometasone-formoterol  2 puff Inhalation BID  . pantoprazole  40 mg Oral Daily  . potassium chloride  40 mEq Oral BID    Continuous Infusions: . sodium chloride 75 mL/hr at 12/25/18 0600  . levofloxacin (LEVAQUIN) IV       LOS: 1 day     Kayleen Memos, MD [facility] Pager [phone]  If 7PM-7AM, please contact night-coverage [email or web address] Password [facility] 12/25/2018, 5:14 PM

## 2018-12-25 NOTE — Evaluation (Signed)
Physical Therapy Evaluation Patient Details Name: Sandra Ferrell MRN: [id number] DOB: Dec 04, 1960 Today's Date: 12/25/2018   History of Present Illness  58 y.o. female with medical history significant of anemia chronic back pain, kidney stones, CHF, COPD, asthma, fibromyalgia,  hyperlipidemia, hypertension, DM2 , recent distal femur fracture s/p ORIF 06/29/2018 with hardware removal 08/20/2018.   Clinical Impression  Pt admitted with above diagnosis. Pt currently with functional limitations due to the deficits listed below (see PT Problem List).  Pt  significantly deconditioned and requiring max encouragement to perform any movement. Recommend SNF however husband is adamantly against  This during PT/OT session.  Pt will benefit from skilled PT to increase their independence and safety with mobility to allow discharge to the venue listed below.       Follow Up Recommendations SNF    Equipment Recommendations  None recommended by PT    Recommendations for Other Services       Precautions / Restrictions Precautions Precautions: Fall Restrictions Weight Bearing Restrictions: No Other Position/Activity Restrictions: no orders as too any WB restrictions.  pt is fixated on her "recent" LLE injury which was femur ORIF in August followed by hardware removal in ?Oct; pt and husband state  they f/u with Dr. Grandville Silos  ~ 2wks ago and they state the "xrays we're good"      Mobility  Bed Mobility Overal bed mobility: Needs Assistance Bed Mobility: Supine to Sit;Sit to Supine     Supine to sit: Mod assist;HOB elevated;+2 for physical assistance Sit to supine: Mod assist;+2 for physical assistance   General bed mobility comments: assist to advance BLE and powerup trunk  Transfers Overall transfer level: Needs assistance Equipment used: Rolling walker (2 wheeled) Transfers: Sit to/from Stand Sit to Stand: Mod assist;+2 physical assistance         General transfer comment: successful on  third attempt. Max encouragement to achieve standing postion. assist to powerup and transition out of trunk flexion.   Ambulation/Gait             General Gait Details: unable  Stairs            Wheelchair Mobility    Modified Rankin (Stroke Patients Only)       Balance Overall balance assessment: Needs assistance Sitting-balance support: Bilateral upper extremity supported;Feet supported Sitting balance-Leahy Scale: Fair     Standing balance support: Bilateral upper extremity supported Standing balance-Leahy Scale: Poor Standing balance comment: RW heavy external support to maintain standing                              Pertinent Vitals/Pain Pain Assessment: Faces Faces Pain Scale: Hurts even more Pain Location: L knee Pain Descriptors / Indicators: Sore Pain Intervention(s): Monitored during session;Repositioned    Home Living Family/patient expects to be discharged to:: Unsure Living Arrangements: Spouse/significant other Available Help at Discharge: Family Type of Home: House                Prior Function Level of Independence: Needs assistance   Gait / Transfers Assistance Needed: transfers to [facility], able to stand for grooming task at sink Reports she spends most of her time in bed.   ADL's / Homemaking Assistance Needed: assist for LB ADLs and transfers        Hand Dominance        Extremity/Trunk Assessment   Upper Extremity Assessment Upper Extremity Assessment: Generalized weakness    Lower Extremity  Assessment Lower Extremity Assessment: Generalized weakness;LLE deficits/detail;RLE deficits/detail RLE Deficits / Details: grossly 3/5 LLE Deficits / Details: knee flexion AROM 25* to 50*; strength 2+/5       Communication   Communication: No difficulties  Cognition Arousal/Alertness: Awake/alert Behavior During Therapy: Flat affect;Anxious Overall Cognitive Status: Within Functional Limits for tasks assessed                                  General Comments: some slow processing, perseveration noted. tangential       General Comments General comments (skin integrity, edema, etc.): Spouse presenting and frequently answering for pt. Spouse became upset during discussion of SNF, "She's not going anywhere but home! I already have all the stuff and a ramp! I can do just as good for her at home as they can there (SNF)!" Later, discussed therapy recommendation of SNF directly with pt and she seems open to the idea. We discussed the benefits of mobility and risks of immobility even when she is feeling poorly. Of note, pt reporting pain in L knee but noted to keep her RLE out on first attempt to stand (increasing WB on LLE).    Exercises     Assessment/Plan    PT Assessment Patient needs continued PT services  PT Problem List Decreased strength;Decreased activity tolerance;Decreased mobility;Pain;Decreased range of motion;Decreased safety awareness;Decreased balance;Decreased knowledge of use of DME       PT Treatment Interventions DME instruction;Functional mobility training;Gait training;Therapeutic activities;Therapeutic exercise;Patient/family education;Balance training    PT Goals (Current goals can be found in the Care Plan section)  Acute Rehab PT Goals Patient Stated Goal: feel better PT Goal Formulation: With patient Time For Goal Achievement: 01/08/19 Potential to Achieve Goals: Good    Frequency Min 2X/week   Barriers to discharge        Co-evaluation   Reason for Co-Treatment: For patient/therapist safety;To address functional/ADL transfers   OT goals addressed during session: ADL's and self-care;Strengthening/ROM;Proper use of Adaptive equipment and DME       AM-PAC PT "6 Clicks" Mobility  Outcome Measure Help needed turning from your back to your side while in a flat bed without using bedrails?: Total Help needed moving from lying on your back to sitting on the side  of a flat bed without using bedrails?: Total Help needed moving to and from a bed to a chair (including a wheelchair)?: Total Help needed standing up from a chair using your arms (e.g., wheelchair or bedside chair)?: Total Help needed to walk in hospital room?: Total Help needed climbing 3-5 steps with a railing? : Total 6 Click Score: 6    End of Session Equipment Utilized During Treatment: Gait belt Activity Tolerance: Patient tolerated treatment well Patient left: in bed;with call bell/phone within reach;with bed alarm set   PT Visit Diagnosis: Muscle weakness (generalized) (M62.81);Unsteadiness on feet (R26.81)    Time: 1110-1140 PT Time Calculation (min) (ACUTE ONLY): 30 min   Charges:   PT Evaluation $PT Eval Moderate Complexity: 1 Mod          Kenyon Ana, PT  Pager: [phone] Acute Rehab Dept [facility]): [phone]   12/25/2018   [facility] 12/25/2018, 1:32 PM

## 2018-12-26 LAB — GLUCOSE, CAPILLARY
GLUCOSE-CAPILLARY: 269 mg/dL — AB (ref 70–99)
Glucose-Capillary: 114 mg/dL — ABNORMAL HIGH (ref 70–99)
Glucose-Capillary: 116 mg/dL — ABNORMAL HIGH (ref 70–99)
Glucose-Capillary: 333 mg/dL — ABNORMAL HIGH (ref 70–99)
Glucose-Capillary: 313 mg/dL — ABNORMAL HIGH (ref 70–99)

## 2018-12-26 LAB — CBC WITH DIFFERENTIAL/PLATELET
Abs Immature Granulocytes: 0.14 10*3/uL — ABNORMAL HIGH (ref 0.00–0.07)
Basophils Absolute: 0 10*3/uL (ref 0.0–0.1)
Basophils Relative: 0 %
Eosinophils Absolute: 0 10*3/uL (ref 0.0–0.5)
Eosinophils Relative: 0 %
HCT: 26.1 % — ABNORMAL LOW (ref 36.0–46.0)
Hemoglobin: 7.7 g/dL — ABNORMAL LOW (ref 12.0–15.0)
Immature Granulocytes: 1 %
Lymphocytes Relative: 19 %
Lymphs Abs: 1.9 10*3/uL (ref 0.7–4.0)
MCH: 27.5 pg (ref 26.0–34.0)
MCHC: 29.5 g/dL — ABNORMAL LOW (ref 30.0–36.0)
MCV: 93.2 fL (ref 80.0–100.0)
MONO ABS: 1 10*3/uL (ref 0.1–1.0)
Monocytes Relative: 10 %
NEUTROS PCT: 70 %
Neutro Abs: 6.8 10*3/uL (ref 1.7–7.7)
Platelets: 297 10*3/uL (ref 150–400)
RBC: 2.8 MIL/uL — ABNORMAL LOW (ref 3.87–5.11)
RDW: 14.5 % (ref 11.5–15.5)
WBC: 9.9 10*3/uL (ref 4.0–10.5)
nRBC: 0 % (ref 0.0–0.2)

## 2018-12-26 LAB — BASIC METABOLIC PANEL
ANION GAP: 7 (ref 5–15)
BUN: 7 mg/dL (ref 6–20)
CO2: 23 mmol/L (ref 22–32)
Calcium: 8 mg/dL — ABNORMAL LOW (ref 8.9–10.3)
Chloride: 105 mmol/L (ref 98–111)
Creatinine, Ser: 0.59 mg/dL (ref 0.44–1.00)
GFR calc Af Amer: >60 mL/min (ref >60)
GFR calc non Af Amer: >60 mL/min (ref >60)
GLUCOSE: 116 mg/dL — AB (ref 70–99)
Potassium: 4.2 mmol/L (ref 3.5–5.1)
Sodium: 135 mmol/L (ref 135–145)

## 2018-12-26 LAB — HEMOGLOBIN A1C
Hgb A1c MFr Bld: 8 % — ABNORMAL HIGH (ref 4.8–5.6)
Mean Plasma Glucose: 183 mg/dL

## 2018-12-26 LAB — MAGNESIUM: Magnesium: 1.9 mg/dL (ref 1.7–2.4)

## 2018-12-26 NOTE — Progress Notes (Signed)
Met with pt and husband at bedside to assess DC needs. Pt and husband aware SNF recommended for rehab however neither desire SNF. Report pt was at [facility]) fall 2019 and are not open to this again at this time.  Had [facility] for home health PTA and have hospital bed, BC, ramp, shower chair, and feel well-set-up to have pt return home.  Covering RNCM made aware of pt's plan also.  Sharren Bridge, MSW, LCSW Clinical Social Work 12/26/2018 [phone]

## 2018-12-26 NOTE — Progress Notes (Signed)
PROGRESS NOTE  Sandra Ferrell OVZ:[id number] DOB: 02/21/1961 DOA: 12/24/2018 PCP: Jinny Sanders, MD  HPI/Recap of past 24 hours: Sandra Ferrell is a 58 y.o. female with medical history significant of anemia chronic back pain, kidney stones, CHF, COPD, asthma fibromyalgia hyperlipidemia hypertension, DM2 , recent distal femur fracture    Presented with   persistent left leg pain decreased p.o. intake.  She had a femur fracture in August and been treated with ORIF by Dr. Grandville Silos but continued to have significant pain.  Has decreased p.o. intake and generalized fatigue.  2 weeks of dysuria.  No associated chest pain or shortness of breath.  No nausea vomiting or diarrhea. Reports main problem she has not had a good appetite, she have had a BM yesterday.   Since her surgery she have not been able to ambulate she have had a lot of nausea Yesterday she had to travel to [facility] by EMS.  12/25/2018: Patient seen and examined with her husband at bedside.  Reports persistent pain in her left flank and generalized weakness.  12/26/18: Seen and examined with husband at bedside.  Left flank pain is improving with her pain management.    Assessment/Plan: Active Problems:   Diabetes mellitus with no complication (HCC)   Hyperlipidemia LDL goal <100   GERD   Fibromyalgia   Sleep apnea   Essential hypertension, benign   COPD, moderate (HCC)   Pyelonephritis  Sepsis secondary to acute left pyelonephritis Presented with leukocytosis and fever with T-max of 102.8, tachycardia and tachypnea Left-sided stranding noted on CT abdomen pelvis Currently on Levaquin due to penicillin allergy Continue gentle IV hydration normal saline at 75 cc/h Urine culture grew greater than 100,000 colonies of Klebsiella pneumoniae Awaiting sensitivities Blood cultures x2 no growth in 2 days Continue IV Levaquin empirically  Chronic normocytic anemia Hemoglobin at baseline appears to be 9 Hemoglobin appears  stable at 7.7 from 7.7 yesterday No sign of overt bleeding Continue to closely monitor Repeat CBC in the morning  Resolving hypovolemic hyponatremia Sodium 135 from 134 from 129 on presentation Continue gentle IV fluid hydration Repeat BMP in the morning  Resolved hypokalemia post repletion  Resolved hypomagnesemia post repletion  Ambulatory dysfunction/physical debility PT recommended SNF Orthopedic surgery recommended weight bearing as tolerated CT scan/x-rays left femur revealed delayed union Vitamin D level pending  Hyperlipidemia LDL goal <100 stable continue home medications   . GERD stable continue home medications . Fibromyalgia stable continue home medications . Sleep apnea not on CPAP . Hypotension: Not on any anti-hypertensive medications at home.  Maintain map greater than 65 . COPD, moderate ([place]) currently stable continue home medications   DVT prophylaxis:  lovenox  Code Status:  FULL CODE  as per patient  I had personally discussed CODE STATUS with patient and family    Family Communication:    Her husband at bedside.  All questions were answered to his satisfaction.    Disposition Plan:  Home versus SNF when hemodynamically stable                                       Objective: Vitals:   12/26/18 0220 12/26/18 0422 12/26/18 0724 12/26/18 0919  BP: (!) 113/54 (!) 97/47 (!) 100/55   Pulse: (!) 102 90 91   Resp: 20 20 (!) 24   Temp: 99.6 F (37.6 C) 98.8 F (37.1 C) 98.8 F (  37.1 C)   TempSrc: Oral Oral Oral   SpO2: 95% 92% 98% 93%  Weight:      Height:        Intake/Output Summary (Last 24 hours) at 12/26/2018 1120 Last data filed at 12/26/2018 5361 Gross per 24 hour  Intake 2010.15 ml  Output 1800 ml  Net 210.15 ml   Filed Weights   12/25/18 0228 12/25/18 0236  Weight: 89.2 kg 104.3 kg    Exam:  . General: 58 y.o. year-old female well-developed well-nourished no acute distress.  Alert oriented x3 . Cardiovascular: Regular rate  and rhythm with no rubs or gallops.  No JVD or thyromegaly . Respiratory: Clear to Auscultation with No Wheezes or Rales.  Good inspiratory effort. . Abdomen: Soft nontender nondistended with normal bowel sounds x4 quadrants. . Musculoskeletal: Trace lower extremity edema. 2/4 pulses in all 4 extremities. Marland Kitchen Psychiatry: Mood is appropriate for condition and setting   Data Reviewed: CBC: Recent Labs  Lab 12/24/18 1947 12/25/18 0407 12/26/18 0706  WBC 20.1* 18.2* 9.9  NEUTROABS 15.8*  --  6.8  HGB 9.4* 7.7* 7.7*  HCT 31.3* 25.8* 26.1*  MCV 90.7 91.5 93.2  PLT 392 295 443   Basic Metabolic Panel: Recent Labs  Lab 12/24/18 1947 12/25/18 0407 12/26/18 0706  NA 129* 134* 135  K 3.3* 3.1* 4.2  CL 97* 102 105  CO2 23 24 23   GLUCOSE 243* 139* 116*  BUN 13 12 7   CREATININE 0.77 0.75 0.59  CALCIUM 7.6* 7.5* 8.0*  MG  --  1.2*  1.3* 1.9  PHOS  --  3.1  3.1  --    GFR: Estimated Creatinine Clearance: 80.2 mL/min (by C-G formula based on SCr of 0.59 mg/dL). Liver Function Tests: Recent Labs  Lab 12/24/18 1947 12/25/18 0407  AST 8* 8*  ALT 7 7  ALKPHOS 81 82  BILITOT 0.5 0.4  PROT 7.4 6.5  ALBUMIN 2.4* 2.0*   No results for input(s): LIPASE, AMYLASE in the last 168 hours. No results for input(s): AMMONIA in the last 168 hours. Coagulation Profile: Recent Labs  Lab 12/24/18 1947  INR 1.26   Cardiac Enzymes: No results for input(s): CKTOTAL, CKMB, CKMBINDEX, TROPONINI in the last 168 hours. BNP (last 3 results) No results for input(s): PROBNP in the last 8760 hours. HbA1C: Recent Labs    12/25/18 0407  HGBA1C 8.0*   CBG: Recent Labs  Lab 12/25/18 1653 12/25/18 2018 12/25/18 2358 12/26/18 0425 12/26/18 0722  GLUCAP 280* 231* 206* 114* 116*   Lipid Profile: No results for input(s): CHOL, HDL, LDLCALC, TRIG, CHOLHDL, LDLDIRECT in the last 72 hours. Thyroid Function Tests: Recent Labs    12/25/18 0407  TSH 1.476   Anemia Panel: No results for  input(s): VITAMINB12, FOLATE, FERRITIN, TIBC, IRON, RETICCTPCT in the last 72 hours. Urine analysis:    Component Value Date/Time   COLORURINE YELLOW 12/24/2018 2008   APPEARANCEUR TURBID (A) 12/24/2018 2008   LABSPEC 1.019 12/24/2018 2008   PHURINE 5.0 12/24/2018 2008   GLUCOSEU NEGATIVE 12/24/2018 2008   GLUCOSEU NEGATIVE 12/26/2010 1044   HGBUR SMALL (A) 12/24/2018 2008   HGBUR large 04/10/2010 [place] NEGATIVE 12/24/2018 2008   BILIRUBINUR neg 12/12/2016 0804   KETONESUR NEGATIVE 12/24/2018 2008   PROTEINUR 100 (A) 12/24/2018 2008   UROBILINOGEN negative 12/12/2016 0804   UROBILINOGEN 0.2 01/12/2015 1037   NITRITE NEGATIVE 12/24/2018 2008   LEUKOCYTESUR LARGE (A) 12/24/2018 2008   Sepsis Labs: @LABRCNTIP (procalcitonin:4,lacticidven:4)  )  Recent Results (from the past 240 hour(s))  Culture, blood (Routine x 2)     Status: None (Preliminary result)   Collection Time: 12/24/18  7:47 PM  Result Value Ref Range Status   Specimen Description BLOOD BLOOD LEFT FOREARM  Final   Special Requests   Final    BOTTLES DRAWN AEROBIC AND ANAEROBIC Blood Culture adequate volume Performed at [facility] [address] [place], [place] [address]    Culture NO GROWTH 2 DAYS  Final   Report Status PENDING  Incomplete  Urine culture     Status: Abnormal (Preliminary result)   Collection Time: 12/24/18  7:48 PM  Result Value Ref Range Status   Specimen Description   Final    URINE, RANDOM Performed at [place] [address] [place], [place] [address]    Special Requests   Final    NONE Performed at [facility], [place] [address] [place], [place] [address]    Culture >=100,000 COLONIES/mL KLEBSIELLA PNEUMONIAE (A)  Final   Report Status PENDING  Incomplete  Culture, blood (Routine x 2)     Status: None (Preliminary result)   Collection Time: 12/24/18  7:52 PM  Result Value Ref Range Status   Specimen  Description BLOOD RIGHT WRIST  Final   Special Requests   Final    BOTTLES DRAWN AEROBIC AND ANAEROBIC Blood Culture results may not be optimal due to an excessive volume of blood received in culture bottles Performed at [facility], [place] [address] [place], [place] [address]    Culture NO GROWTH 2 DAYS  Final   Report Status PENDING  Incomplete      Studies: Ct Femur Left Wo Contrast  Result Date: 12/25/2018 CLINICAL DATA:  Intramedullary nail fixation of distal comminuted femoral fracture. Assess for healing. EXAM: CT OF THE LOWER LEFT EXTREMITY WITHOUT CONTRAST TECHNIQUE: Multidetector CT imaging of the lower left extremity was performed according to the standard protocol. COMPARISON:  Preop radiographs of the knee 06/28/2018 FINDINGS: Bones/Joint/Cartilage A reverse intramedullary nail fixation across a comminuted distal diaphyseal fracture of the left femur is identified. Faint callus formation is identified about the comminuted fracture without significant bony bridging. No hardware failure. No new fracture. Disuse osteopenia is noted. Osteoarthritis of the knee with bone-on-bone apposition of the medial femoral condyle with tibial plateau is identified. Moderate joint space narrowing of the native left hip. No new fracture or suspicious osseous lesions. Ligaments Suboptimally assessed by CT. Muscles and Tendons Mild generalized atrophy without intramuscular mass or hemorrhage. Soft tissues Focal soft tissue swelling or fluid collection. IMPRESSION: 1. A reverse intramedullary nail fixation across a comminuted distal diaphyseal fracture of the left femur is identified. Faint callus formation is identified about the comminuted fracture without significant bony bridging. No hardware failure. 2. Osteoarthritis of the knee with bone-on-bone apposition of the medial femoral condyle and tibial plateau. 3. Disuse osteopenia and mild generalized muscular atrophy of the thigh.  Electronically Signed   By: Ashley Royalty M.D.   On: 12/25/2018 19:49   Dg Femur Min 2 Views Left  Result Date: 12/25/2018 CLINICAL DATA:  Follow-up femur fracture EXAM: LEFT FEMUR 2 VIEWS COMPARISON:  06/30/2018 FINDINGS: Left femoral head projects in joint. Status post intramedullary rod with proximal and distal screw fixation of the femur across comminuted distal femoral fracture. Slight increased displacement at the fracture site. There is possible lucency around the distal fixating screw with the screw head protruding beyond  the anterior cortical margin of the femur. There is some callus formation evident with less distinct appearance of the fracture lines. IMPRESSION: 1. Surgically fixated distal femoral fracture with some callus formation and less distinct fracture lines as compared with prior radiograph. 2. There is slight increased displacement at the site of fracture 3. Possible lucency around the distal fixating screw. Electronically Signed   By: Donavan Foil M.D.   On: 12/25/2018 20:16    Scheduled Meds: . ALPRAZolam  0.5 mg Oral TID  . amitriptyline  75 mg Oral QHS  . enoxaparin (LOVENOX) injection  50 mg Subcutaneous Daily  . feeding supplement (GLUCERNA SHAKE)  237 mL Oral BID BM  . gabapentin  300 mg Oral 2 times per day  . gabapentin  600 mg Oral QHS  . gemfibrozil  600 mg Oral BID AC  . insulin aspart  0-9 Units Subcutaneous Q4H  . insulin glargine  15 Units Subcutaneous QHS  . mometasone-formoterol  2 puff Inhalation BID  . pantoprazole  40 mg Oral Daily    Continuous Infusions: . sodium chloride 75 mL/hr at 12/26/18 0938  . levofloxacin (LEVAQUIN) IV 750 mg (12/25/18 2125)     LOS: 2 days     Kayleen Memos, MD [facility] Pager [phone]  If 7PM-7AM, please contact night-coverage [email or web address] Password [facility] 12/26/2018, 11:20 AM

## 2018-12-26 NOTE — Progress Notes (Signed)
Patient well-known to me, s/p ORIF L distal femur fx.   CT scan/xrays left femur reveals delayed union  I have ordered Vitamin D level, and if low, will start augmentation. Will begin bone stimulator for 3 months as outpatient. Continue WBAT  Micheline Rough, MD [facility] [phone]

## 2018-12-26 NOTE — Care Management Important Message (Signed)
Important Message  Patient Details  Name: Sandra Ferrell MRN: [id number] Date of Birth: Jan 27, 1961   Medicare Important Message Given:  Yes    Kerin Salen 12/26/2018, 3:42 [place] Message  Patient Details  Name: Sandra Ferrell MRN: [id number] Date of Birth: 31-Dec-1960   Medicare Important Message Given:  Yes    Kerin Salen 12/26/2018, 3:41 PM

## 2018-12-26 NOTE — Progress Notes (Signed)
Report received from [name], Therapist, sports. Ferrell agree with previous RN's assessment and will continue to monitor patient closely. Sandra Ferrell

## 2018-12-27 LAB — GLUCOSE, CAPILLARY
Glucose-Capillary: 232 mg/dL — ABNORMAL HIGH (ref 70–99)
Glucose-Capillary: 233 mg/dL — ABNORMAL HIGH (ref 70–99)
Glucose-Capillary: 267 mg/dL — ABNORMAL HIGH (ref 70–99)
Glucose-Capillary: 271 mg/dL — ABNORMAL HIGH (ref 70–99)
Glucose-Capillary: 290 mg/dL — ABNORMAL HIGH (ref 70–99)

## 2018-12-27 LAB — VITAMIN D 25 HYDROXY (VIT D DEFICIENCY, FRACTURES): Vit D, 25-Hydroxy: 8 ng/mL — ABNORMAL LOW (ref 30.0–100.0)

## 2018-12-27 LAB — URINE CULTURE: Culture: >=100000 — AB

## 2018-12-27 MED ORDER — LEVOFLOXACIN 750 MG PO TABS: 750.0000 mg | ORAL_TABLET | Freq: Every day | ORAL | Status: DC

## 2018-12-27 MED ORDER — VITAMIN D 25 MCG (1000 UNIT) PO TABS
1000.0000 [IU] | ORAL_TABLET | Freq: Every day | ORAL | Status: DC
  Administered 2018-12-27: 1000 [IU] via ORAL
  Filled 2018-12-27: qty 1

## 2018-12-27 MED ORDER — GLUCERNA SHAKE PO LIQD: 237.0000 mL | Freq: Two times a day (BID) | ORAL | 0 refills | Status: DC

## 2018-12-27 MED ORDER — LEVOFLOXACIN 750 MG PO TABS: 750.0000 mg | ORAL_TABLET | Freq: Every day | ORAL | 0 refills | Status: AC

## 2018-12-27 MED ORDER — VITAMIN D (ERGOCALCIFEROL) 1.25 MG (50000 UNIT) PO CAPS: 50000.0000 [IU] | ORAL_CAPSULE | ORAL | 0 refills | Status: DC

## 2018-12-27 MED ORDER — VITAMIN D (ERGOCALCIFEROL) 1.25 MG (50000 UNIT) PO CAPS
50000.0000 [IU] | ORAL_CAPSULE | ORAL | Status: DC
  Administered 2018-12-27: 50000 [IU] via ORAL
  Filled 2018-12-27: qty 1

## 2018-12-27 NOTE — Discharge Instructions (Signed)
Urosepsis, Adult  Urosepsis is an infection that has spread to the blood (sepsis) from the kidneys, ureters, bladder, and urethra (urinary tract). These organs make, store, and pass urine from the body. Urosepsis is a severe illness that can be life-threatening if it is not treated immediately. What are the causes? Causes of this condition include:  A urinary tract infection (UTI) that spreads to your blood.  A urinary tract blockage (obstruction) due to kidney stones.  Having a small, thin tube in your urethra that drains urine from your bladder for a period of time (indwelling urinary catheter).  Swelling of the prostate (prostatitis) or prostate infection (abscess), in men. What increases the risk? This condition is more likely to develop in people who:  Are female.  Are 65 or older.  Have a long-term (chronic) disease, such as diabetes.  Have a weak disease-fighting system (immune system).  Have a condition that lessens or changes urine flow, such as a kidney or bladder stone, prostate disease, or a tumor of the urinary tract.  Have had surgery of the urinary tract.  Use a urinary catheter.  Have lost feeling below the waist or are in a wheelchair. What are the signs or symptoms? Early symptoms of this condition are similar to symptoms of a severe UTI. These may include:  Pain in your side, back, or lower abdomen.  Fever and chills.  Nausea and vomiting.  Frequent need to pass urine.  Burning pain when passing urine.  Bloody or cloudy urine.  Bad-smelling urine.  Fatigue. Once the infection has spread to the blood and a sepsis reaction starts, other symptoms may include:  High fever.  Chills with shaking.  Fast breathing.  Fast heartbeat.  Cold and clammy skin.  Anxiety.  Confusion.  Severe pain in the abdomen.  Trouble breathing.  Trouble passing urine or not being able to pass urine.  Fainting. How is this diagnosed? This condition is  diagnosed based on your symptoms, your medical history, and a physical exam. You may have urine tests or blood tests to check kidney function and look for infection. You may also have imaging tests to check for blockages in the urinary tract. How is this treated? This condition is a medical emergency that needs to be treated in the hospital. Treatment may include:  Receiving one or more of the following through an IV: ? Antibiotic medicines. ? Fluids. ? Medicines to support blood pressure.  Oxygen and breathing support.  Removing a urinary catheter that may be a source of infection, if you have one.  Filtering your blood with a machine (dialysis).  Surgery to drain infected areas or restore urine flow. This is rare. Follow these instructions at home:  Take over-the-counter and prescription medicines only as told by your health care provider.  If you were prescribed an antibiotic medicine to take at home, take it as told by your health care provider. Do not stop taking the antibiotic even if you start to feel better.  Drink enough fluid to keep your urine pale yellow.  Return to your normal activities as told by your health care provider. Ask your health care provider what activities are safe for you.  Keep all follow-up visits as told by your health care provider. This is important. Contact a health care provider if you have:  Symptoms that get worse or do not get better with treatment.  New UTI symptoms. Get help right away if:  You have new or continued symptoms of sepsis  after hospitalization, such as: ? High fever. ? Chills. ? Difficulty breathing. ? Confusion. ? Nausea and vomiting. These symptoms may represent a serious problem that is an emergency. Do not wait to see if the symptoms will go away. Get medical help right away. Call your local emergency services (911 in the [place] Do not drive yourself to the hospital. Summary  Urosepsis is an infection that has spread  to the blood (sepsis) from the urinary tract. It is a severe illness that can be life-threatening if it is not treated immediately.  Possible causes of urosepsis include a UTI that spreads to your blood, blockage from kidney stones, having a urinary catheter, or prostate swelling or infection.  This condition is a medical emergency that is treated in the hospital with antibiotics. This information is not intended to replace advice given to you by your health care provider. Make sure you discuss any questions you have with your health care provider. Document Released: 10/22/2005 Document Revised: 09/12/2017 Document Reviewed: 09/12/2017 Elsevier Interactive Patient Education  2019 [employer]   Sepsis, Diagnosis, Adult Sepsis is a serious bodily reaction to an infection. The infection that triggers sepsis may be from a bacteria, virus, or fungus. Sepsis can result from an infection in any part of your body. Infections that commonly lead to sepsis include skin, lung, and urinary tract infections. Sepsis is a medical emergency that must be treated right away in a hospital. In severe cases, it can lead to septic shock. Septic shock can weaken your heart and cause your blood pressure to drop. This can cause your central nervous system and your body's organs to stop working. What are the causes? This condition is caused by a severe reaction to infections from bacteria, viruses, or fungus. The germs that most often lead to sepsis include:  Escherichia coli (E. coli) bacteria.  Staphylococcus aureus (staph) bacteria.  Some types of Streptococcus bacteria. The most common infections affect these organs:  The lung (pneumonia).  The kidneys or bladder (urinary tract infection).  The skin (cellulitis).  The bowel, gallbladder, or pancreas. What increases the risk? You are more likely to develop this condition if:  Your body's disease-fighting system (immune system) is weakened.  You are age  70 or older.  You are female.  You had surgery or you have been hospitalized.  You have these devices inserted into your body: ? A small, thin tube (catheter). ? IV line. ? Breathing tube. ? Drainage tube.  You are not getting enough nutrients from food (malnourished).  You have a long-term (chronic) disease, such as cancer, lung disease, kidney disease, or diabetes.  You are African American. What are the signs or symptoms? Symptoms of this condition may include:  Fever.  Chills or feeling very cold.  Confusion or anxiety.  Fatigue.  Muscle aches.  Shortness of breath.  Nausea and vomiting.  Urinating much less than usual.  Fast heart rate (tachycardia).  Rapid breathing (hyperventilation).  Changes in skin color. Your skin may look blotchy, pale, or blue.  Cool, clammy, or sweaty skin.  Skin rash. Other symptoms depend on the source of your infection. How is this diagnosed? This condition is diagnosed based on:  Your symptoms.  Your medical history.  A physical exam. Other tests may also be done to find out the cause of the infection and how severe the sepsis is. These tests may include:  Blood tests.  Urine tests.  Swabs from other areas of your body that may have  an infection. These samples may be tested (cultured) to find out what type of bacteria is causing the infection.  Chest X-ray to check for pneumonia. Other imaging tests, such as a CT scan, may also be done.  Lumbar puncture. This removes a small amount of the fluid that surrounds your brain and spinal cord. The fluid is then examined for infection. How is this treated? This condition must be treated in a hospital. Based on the cause of your infection, you may be given an antibiotic, antiviral, or antifungal medicine. You may also receive:  Fluids through an IV.  Oxygen and breathing assistance.  Medicines to increase your blood pressure.  Kidney dialysis. This process cleans your  blood if your kidneys have failed.  Surgery to remove infected tissue.  Blood transfusion if needed.  Medicine to prevent blood clots.  Nutrients to correct imbalances in basic body function (metabolism). You may: ? Receive important salts and minerals (electrolytes) through an IV. ? Have your blood sugar level adjusted. Follow these instructions at home: Medicines   Take over-the-counter and prescription medicines only as told by your health care provider.  If you were prescribed an antibiotic, antiviral, or antifungal medicine, take it as told by your health care provider. Do not stop taking the medicine even if you start to feel better. General instructions  If you have a catheter or other indwelling device, ask to have it removed as soon as possible.  Keep all follow-up visits as told by your health care provider. This is important. Contact a health care provider if:  You do not feel like you are getting better or regaining strength.  You are having trouble coping with your recovery.  You frequently feel tired.  You feel worse or do not seem to get better after surgery.  You think you may have an infection after surgery. Get help right away if:  You have any symptoms of sepsis.  You have difficulty breathing.  You have a rapid or skipping heartbeat.  You become confused or disoriented.  You have a high fever.  Your skin becomes blotchy, pale, or blue.  You have an infection that is getting worse or not getting better. These symptoms may represent a serious problem that is an emergency. Do not wait to see if the symptoms will go away. Get medical help right away. Call your local emergency services (911 in the [place] Do not drive yourself to the hospital. Summary  Sepsis is a medical emergency that requires immediate treatment in a hospital.  This condition is caused by a severe reaction to infections from bacteria, viruses, or fungus.  Based on the cause of  your infection, you may be given an antibiotic, antiviral, or antifungal medicine.  Treatment may also include IV fluids, breathing assistance, and kidney dialysis. This information is not intended to replace advice given to you by your health care provider. Make sure you discuss any questions you have with your health care provider. Document Released: 07/21/2003 Document Revised: 05/30/2018 Document Reviewed: 05/30/2018 Elsevier Interactive Patient Education  2019 [employer].

## 2018-12-27 NOTE — Progress Notes (Signed)
Pt discharged to home. DC instructions given with husband at bedside. No concerns voiced.  Pt reminded to pick up meds from [facility] [address]. Husband to stop and pick med up. Pt left unit on stretcher via ambulance transport. Left in stable condition.  VWilliams,RN.

## 2018-12-27 NOTE — Care Management Note (Signed)
Case Management Note  Patient Details  Name: SAHVANNAH RIESER MRN: [id number] Date of Birth: 08/27/1961  Subjective/Objective:   Admitted with Sepsis s/t acute left pyelonephritis, left distal femur fracture nonunion, ambulatory dysfunction, physical debility                Action/Plan: NCM spoke to pt and husband at bedside. Offered choice for HH/medicare list placed on chart and given to pt. Pt requested [facility]. Contacted [facility] and they declined referral. Husband states the PCP sent referral to [facility] last week. States he will follow up on Monday with [facility]. Pt agreeable to [facility] for HH. States she had [facility] in the past also. Has RW, wheelchair, ramp, hospital bed and bedside commode at home. PTAR arranged. Contacted [facility] with new referral.   Expected Discharge Date:  12/27/18               Expected Discharge Plan:  [place]  In-House Referral:  NA  Discharge planning Services  CM Consult  Post Acute Care Choice:  Home Health Choice offered to:  Patient  DME Arranged:  N/A DME Agency:  NA  HH Arranged:  PT, OT, Nurse's Aide [place] Agency:  [facility]  Status of Service:  Completed, signed off  If discussed at [place] of Stay Meetings, dates discussed:    Additional Comments:  Erenest Rasher, RN 12/27/2018, 4:23 PM

## 2018-12-27 NOTE — Progress Notes (Signed)
ORTHOPAEDIC EVALUATION  Chief Complaint: left thigh pain  HPI: Sandra Ferrell is a 58 y.o. female with multiple medical comorbidities who sustained a left distal femur fracture in August, 2019, which underwent operative treatment with retrograde nailing.  She was initially discharged to a skilled nursing facility, but then ultimately proceeded to home care.  She was last evaluated in the office on October 23, 2018, at that time still without radiographic healing.  She failed to keep several appointments in the office, and her attendance at outpatient physical therapy was similarly spotty.  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  A plan was to obtain CT scan for confirmation, before making adjustments in her care plan.  It was the following day that she was admitted to the hospital for urinary tract infection/pyelonephritis.  CT scan and repeat radiographs of the left femur were obtained, confirming nonunion, with some callus evident.  Fracture and hardware alignment remain acceptable.  Vitamin D level obtained, and is low at 8.  Past Medical History:  Diagnosis Date  . Allergic rhinitis, cause unspecified   . Anemia   . Anxiety state, unspecified   . Backache, unspecified   . Calculus of kidney   . Carpal tunnel syndrome, right   . CHF (congestive heart failure) ([place])    unspecified   . Chronic pain syndrome   . Constipation   . COPD (chronic obstructive pulmonary disease) ([place])   . Cough   . Depressive disorder, not elsewhere classified    managed with medications  . Difficulty in walking   . Displaced intertrochanteric fracture of left femur ([place])   . Esophageal reflux   . Extrinsic asthma with exacerbation   . Fibromyalgia   . Heart murmur   . Hyperlipidemia   . Hypertension   . Muscle weakness (generalized)   . Nicotine dependence, cigarettes, uncomplicated   . Osteoarthrosis,  unspecified whether generalized or localized, unspecified site   . Other abnormalities of gait and mobility   . Other and unspecified hyperlipidemia   . Other irritable bowel syndrome   . Other screening mammogram   . Personal history of unspecified urinary disorder   . Pneumonia   . Pressure ulcer of sacral region   . Routine general medical examination at a health care facility   . Routine gynecological examination   . Tobacco use disorder   . Type II or unspecified type diabetes mellitus without mention of complication, not stated as uncontrolled   . Unspecified fall, subsequent encounter   . Unspecified sleep apnea   . Urinary tract infection   . Vaginitis   . Wears glasses   . Wheezing    Past Surgical History:  Procedure Laterality Date  . ANTERIOR CERVICAL DECOMP/DISCECTOMY FUSION N/A 01/19/2015   Procedure: ANTERIOR CERVICAL DECOMPRESSION/DISCECTOMY FUSION 2 LEVELS;  Surgeon: Phylliss Bob, MD;  Location: [facility];  Service: Orthopedics;  Laterality: N/A;  Anterior cervical decompression fusion, cervical 5-6, cervical 6-7 with instrumentation and allograft  . ANTERIOR CERVICAL DECOMP/DISCECTOMY FUSION N/A 05/23/2017   Procedure: ANTERIOR CERVICAL DECOMPRESSION FUSION, CERVICAL 4-5 WITH INSTRUMENTATION AND ALLOGRAFT;  Surgeon: Phylliss Bob, MD;  Location: [facility];  Service: Orthopedics;  Laterality: N/A;  ANTERIOR CERVICAL DECOMPRESSION FUSION, CERVICAL 4-5 WITH INSTRUMENTATION AND ALLOGRAFT; REQUEST 2 HOURS AND FLIP ROOM  . APPENDECTOMY  1973  . BACK SURGERY    . CHOLECYSTECTOMY  08/2006  . COLONOSCOPY    . HARDWARE REMOVAL  Left 08/20/2018   Procedure: HARDWARE REMOVAL FROM LEFT KNEE;  Surgeon: Milly Jakob, MD;  Location: [facility];  Service: Orthopedics;  Laterality: Left;  . ORIF FEMUR FRACTURE Left 06/29/2018   Procedure: OPEN REDUCTION INTERNAL FIXATION (ORIF) DISTAL FEMUR FRACTURE;  Surgeon: Milly Jakob, MD;  Location: [facility];  Service: Orthopedics;  Laterality: Left;  .  TRANSTHORACIC ECHOCARDIOGRAM  02/2011   mild LVH, nl EF, mild diastolic dysfunction, no wall motion abnl   Social History   Socioeconomic History  . Marital status: Married    Spouse name: Not on file  . Number of children: Not on file  . Years of education: Not on file  . Highest education level: Not on file  Occupational History  . Occupation: Housewife  Social Needs  . Financial resource strain: Not on file  . Food insecurity:    Worry: Not on file    Inability: Not on file  . Transportation needs:    Medical: Not on file    Non-medical: Not on file  Tobacco Use  . Smoking status: Current Every Day Smoker    Packs/day: 1.00    Years: 35.00    Pack years: 35.00    Types: Cigarettes  . Smokeless tobacco: Never Used  Substance and Sexual Activity  . Alcohol use: No  . Drug use: No  . Sexual activity: Never  Lifestyle  . Physical activity:    Days per week: Not on file    Minutes per session: Not on file  . Stress: Not on file  Relationships  . Social connections:    Talks on phone: Not on file    Gets together: Not on file    Attends religious service: Not on file    Active member of club or organization: Not on file    Attends meetings of clubs or organizations: Not on file    Relationship status: Not on file  Other Topics Concern  . Not on file  Social History Narrative   Moderate diet.    No living will, no HCPOA.   Family History  Problem Relation Age of Onset  . Stroke Father   . Colon cancer Cousin    Allergies  Allergen Reactions  . Benazepril Anaphylaxis, Swelling and Other (See Comments)    Angioedema, throat swelling  . Diclofenac Sodium Other (See Comments)    GI bleed  . Fluticasone-Salmeterol Hives, Itching and Swelling    Tongue was swollen  . Nsaids Other (See Comments)    Rectal bleeding  . Penicillins Hives and Rash    Has patient had a PCN reaction causing immediate rash, facial/tongue/throat swelling, SOB or lightheadedness with  hypotension: no Has patient had a PCN reaction causing severe rash involving mucus membranes or skin necrosis: no Has patient had a PCN reaction that required hospitalization no Has patient had a PCN reaction occurring within the last 10 years: no If all of the above answers are "NO", then may proceed with Cephalosporin use.   Marland Kitchen Morphine And Related     rash  . Aspirin Other (See Comments)    Burns stomach  . Propoxyphene Other (See Comments)    Darvocet - Headache   Prior to Admission medications   Medication Sig Start Date End Date Taking? Authorizing Provider  ALPRAZolam Duanne Moron) 0.5 MG tablet Take 1 tablet (0.5 mg total) by mouth 3 (three) times daily. 07/03/18  Yes Shelly Coss, MD  amitriptyline (ELAVIL) 75 MG tablet Take 1 tablet (75 mg total) by  mouth at bedtime. 07/03/18  Yes Shelly Coss, MD  budesonide-formoterol (SYMBICORT) 160-4.5 MCG/ACT inhaler INHALE 2 PUFFS INTO THE LUNGS TWICE DAILY 12/01/18  Yes Bedsole, Amy E, MD  dimenhyDRINATE (DRAMAMINE PO) Take 1 tablet by mouth daily as needed (nausea).   Yes [provider]  diphenhydramine-acetaminophen (TYLENOL PM) 25-500 MG TABS tablet Take 1 tablet by mouth at bedtime as needed.   Yes [provider]  Liniments (SALONPAS PAIN RELIEF PATCH EX) Apply 1 patch topically daily as needed (pain).   Yes [provider]  loperamide (IMODIUM A-D) 2 MG tablet Take 2 mg by mouth 4 (four) times daily as needed for diarrhea or loose stools.   Yes [provider]  metFORMIN (GLUCOPHAGE) 1000 MG tablet TAKE 1 TABLET BY MOUTH TWICE DAILY WITH MEALS Patient taking differently: Take 1,000 mg by mouth 2 (two) times daily with a meal.  06/13/18  Yes Bedsole, Amy E, MD  omeprazole (PRILOSEC OTC) 20 MG tablet Take 40 mg by mouth daily.   Yes [provider]  OVER THE COUNTER MEDICATION Apply 1 application topically daily as needed. Hempvana   Yes [provider]  PROAIR HFA 108 (90 Base) MCG/ACT  inhaler TAKE 2 PUFFS BY MOUTH EVERY 4 HOURS AS NEEDED FOR WHEEZE OR FOR SHORTNESS OF BREATH Patient taking differently: Inhale 2 puffs into the lungs every 4 (four) hours as needed for wheezing or shortness of breath.  03/29/18  Yes Bedsole, Amy E, MD  ACCU-CHEK AVIVA PLUS test strip TEST BLOOD SUGAR TWICE DAILY AND DIRECTED 11/26/17   Jinny Sanders, MD  ACCU-CHEK SOFTCLIX LANCETS lancets CHECK BLOOD SUGAR TWICE DAILY AND AS DIRECTED 11/08/16   Jinny Sanders, MD  AIMSCO INSULIN SYR ULTRA THIN 31G X 5/16" 0.3 ML MISC  04/02/15   [provider]  Alcohol Swabs PADS Check blood sugar twice a day and as directed. Dx E11.9 11/03/15   Jinny Sanders, MD  ALPRAZolam (XANAX) 0.5 MG tablet TAKE 1 TABLET BY MOUTH THREE TIMES DAILY. Patient not taking: Reported on 12/24/2018 12/10/18   Jinny Sanders, MD  B-D ULTRAFINE III SHORT PEN 31G X 8 MM MISC USE AS DIRECTED WITH INSULIN PEN 09/18/16   Bedsole, Amy E, MD  Blood Glucose Monitoring Suppl (ACCU-CHEK AVIVA PLUS) w/Device KIT Check blood sugar twice daily and as directed. 02/07/17   Bedsole, Amy E, MD  dicyclomine (BENTYL) 20 MG tablet Take 1 tablet (20 mg total) 2 (two) times daily by mouth. 09/24/17   Kirichenko, Tatyana, PA-C  ferrous sulfate 325 (65 FE) MG tablet Take 325 mg by mouth daily with breakfast.    [provider]  furosemide (LASIX) 20 MG tablet TAKE 1 TABLET(20 MG) BY MOUTH DAILY Patient taking differently: Take 20 mg by mouth daily.  11/24/18   Bedsole, Amy E, MD  gabapentin (NEURONTIN) 300 MG capsule TAKE 1 CAPSULE BY MOUTH IN THE MORNING, 1 CAPSULE IN THE AFTERNOON AND 2 CAPSULES AT BEDTIME Patient taking differently: Take 300-600 mg by mouth See admin instructions. 300 mg in the am, 300 mg in the afternoon and 600 mg at bedtime 10/27/18   Bedsole, Amy E, MD  gemfibrozil (LOPID) 600 MG tablet TAKE 1 TABLET BY MOUTH TWICE DAILY Patient taking differently: Take 600 mg by mouth 2 (two) times daily before a meal.  06/05/18   Bedsole, Amy  E, MD  HYDROcodone-acetaminophen (NORCO/VICODIN) 5-325 MG tablet Take 1-2 tablets by mouth every 4 (four) hours as needed for moderate pain (  pain score 4-6). Patient taking differently: Take 1-2 tablets by mouth See admin instructions. Take 2 tablets by mouth daily in the morning, take 1 tablet at 1400, & take 1 tablet at 2200. 07/03/18   Shelly Coss, MD  Insulin Glargine (LANTUS SOLOSTAR) 100 UNIT/ML Solostar Pen ADMINISTER 35 UNITS UNDER THE SKIN AT BEDTIME Patient taking differently: Inject 30 Units into the skin at bedtime.  06/20/18   Jinny Sanders, MD  Lancet Devices (ADJUSTABLE LANCING DEVICE) MISC Check blood sugar twice a day and as directed. Dx E11.9 11/03/15   Jinny Sanders, MD  methocarbamol (ROBAXIN) 500 MG tablet Take 2 tablets (1,000 mg total) by mouth at bedtime. Patient taking differently: Take 1,000 mg by mouth at bedtime as needed for muscle spasms.  08/07/18   Bedsole, Amy E, MD  pioglitazone (ACTOS) 45 MG tablet TAKE 1 TABLET BY MOUTH EVERY DAY 12/15/18   Bedsole, Amy E, MD  polyethylene glycol (MIRALAX / GLYCOLAX) packet Take 17 g by mouth daily. Patient not taking: Reported on 12/24/2018 07/03/18   Shelly Coss, MD  sulfamethoxazole-trimethoprim (BACTRIM DS,SEPTRA DS) 800-160 MG tablet Take 1 tablet by mouth 2 (two) times daily. Patient not taking: Reported on 12/24/2018 10/17/18   Jinny Sanders, MD   Ct Femur Left Wo Contrast  Result Date: 12/25/2018 CLINICAL DATA:  Intramedullary nail fixation of distal comminuted femoral fracture. Assess for healing. EXAM: CT OF THE LOWER LEFT EXTREMITY WITHOUT CONTRAST TECHNIQUE: Multidetector CT imaging of the lower left extremity was performed according to the standard protocol. COMPARISON:  Preop radiographs of the knee 06/28/2018 FINDINGS: Bones/Joint/Cartilage A reverse intramedullary nail fixation across a comminuted distal diaphyseal fracture of the left femur is identified. Faint callus formation is identified about the  comminuted fracture without significant bony bridging. No hardware failure. No new fracture. Disuse osteopenia is noted. Osteoarthritis of the knee with bone-on-bone apposition of the medial femoral condyle with tibial plateau is identified. Moderate joint space narrowing of the native left hip. No new fracture or suspicious osseous lesions. Ligaments Suboptimally assessed by CT. Muscles and Tendons Mild generalized atrophy without intramuscular mass or hemorrhage. Soft tissues Focal soft tissue swelling or fluid collection. IMPRESSION: 1. A reverse intramedullary nail fixation across a comminuted distal diaphyseal fracture of the left femur is identified. Faint callus formation is identified about the comminuted fracture without significant bony bridging. No hardware failure. 2. Osteoarthritis of the knee with bone-on-bone apposition of the medial femoral condyle and tibial plateau. 3. Disuse osteopenia and mild generalized muscular atrophy of the thigh. Electronically Signed   By: Ashley Royalty M.D.   On: 12/25/2018 19:49   Dg Femur Min 2 Views Left  Result Date: 12/25/2018 CLINICAL DATA:  Follow-up femur fracture EXAM: LEFT FEMUR 2 VIEWS COMPARISON:  06/30/2018 FINDINGS: Left femoral head projects in joint. Status post intramedullary rod with proximal and distal screw fixation of the femur across comminuted distal femoral fracture. Slight increased displacement at the fracture site. There is possible lucency around the distal fixating screw with the screw head protruding beyond the anterior cortical margin of the femur. There is some callus formation evident with less distinct appearance of the fracture lines. IMPRESSION: 1. Surgically fixated distal femoral fracture with some callus formation and less distinct fracture lines as compared with prior radiograph. 2. There is slight increased displacement at the site of fracture 3. Possible lucency around the distal fixating screw. Electronically Signed   By: Donavan Foil M.D.   On: 12/25/2018 20:16  Positive ROS: All other systems have been reviewed and were otherwise negative with the exception of those mentioned in the HPI and as above.  Physical Exam: Vitals: Refer to EMR. Constitutional: Obese female, not presently in distress. HEENT:  NCAT, EOMI Neuro/Psych:  Alert & oriented to person, place, and time; appropriate mood & affect Lymphatic: No generalized extremity edema or lymphadenopathy Extremities / MSK:  The extremities are normal with respect to appearance, ranges of motion, joint stability, muscle strength/tone, sensation, & perfusion except as otherwise noted:  The left knee has a slight flexion contracture, causing her to lie with the leg slightly externally rotated.  There is mild diffuse tenderness globally about the distal thigh and knee.  Intact light touch sensibility on the plantar and dorsal aspects of the foot, including the first webspace, with retained ability to flex and extend the ankle and toes.  Assessment: Left distal femur fracture nonunion, now greater than 6 months postop  Recommendations: I discussed these findings with the patient and her husband this morning.  I have initiated vitamin D replacement at 50,000 units weekly, and strongly recommended to them that they lend appropriate consideration to the previous recommendations that have been made during this admission by physical therapy and her other physicians regarding her postoperative discharge planning--specifically skilled nursing versus home care.  I will plan to have her return to the office in a month for reevaluation.    Rayvon Char [address], [address] [place], [place]  [address] Office: [phone] Mobile: [phone]  12/27/2018, 8:56 AM

## 2018-12-27 NOTE — Progress Notes (Signed)
PHARMACY NOTE -  [facility] has been assisting with dosing of Levaquin for Klebsiella UTI in patient with PCN allergy. Dosage remains stable at Levaquin 750mg  daily and need for further dosage adjustment appears unlikely at present.  Changed to oral formulation today and stop date of 7 days entered as discussed with MD.   Will sign off at this time.  Please reconsult if a change in clinical status warrants re-evaluation of dosage.  Netta Cedars, PharmD, BCPS 12/27/2018@10 :11 AM

## 2018-12-27 NOTE — Discharge Summary (Addendum)
Discharge Summary  Sandra Ferrell MRN:[id number] DOB: 09/18/1961  PCP: Bedsole, Amy E, MD  Admit date: 12/24/2018 Discharge date: 12/27/2018  Time spent: 35 minutes  Recommendations for Outpatient Follow-up:  1. Follow-up with your PCP 2. Follow-up with your orthopedic surgeon 3. Take your medications as prescribed 4. Continue physical therapy as recommended by orthopedic surgery 5. Weightbearing as tolerated per orthopedic surgery  Discharge Diagnoses:  Active Hospital Problems   Diagnosis Date Noted  . Pyelonephritis 12/24/2018  . COPD, moderate (HCC) 03/22/2016  . Essential hypertension, benign 02/21/2011  . Diabetes mellitus with no complication (HCC) 06/24/2007  . Hyperlipidemia LDL goal <100 03/12/2007  . GERD 03/12/2007  . Fibromyalgia 03/12/2007  . Sleep apnea 03/12/2007    Resolved Hospital Problems  No resolved problems to display.    Discharge Condition: Stable  Diet recommendation: Resume previous diet  Vitals:   12/27/18 0444 12/27/18 0918  BP: (!) 103/52   Pulse: 94   Resp: 18   Temp: 98.6 F (37 C)   SpO2: 93% 95%    History of present illness:  Sandra Ferrellis a 58 y.o.femalewith medical history significant of chronic back pain, kidney stones, dCHF, COPD, asthma, fibromyalgia, hyperlipidemia, hypertension, DM2, severe morbid obesity, recent distal femur fracture post surgical repair who presented to [facility] ED withpersistent left leg pain and decreased p.o. intake. Also 2 weeks of dysuria. Since her surgery she has not been able to ambulate.  Had to travel to Orthopedics by EMS.   U/A positive for pyuria and imaging consistent with acute L pyelonephritis.  Was admitted and started on IV Levaquin due to her penicillin allergy.  Responded well to treatment with resolution of leukocytosis.  Urine culture grew Klebsiella pneumoniae with resistance to ampicillin and sensitive to ciprofloxacin.  Hospital course complicated by ambulatory dysfunction  with recommendation for SNF by PT.  Patient and her husband decline SNF and request home health services.  12/27/2018: Patient seen and examined with her husband at bedside.  No acute events overnight.  Afebrile with no leukocytosis.  She has no new complaints.  On the day of discharge, the patient was hemodynamically stable.  She will need to follow-up with her primary care provider and orthopedic surgery posthospitalization.  She will also need to take her medications as prescribed.      Hospital Course:  Active Problems:   Diabetes mellitus with no complication (HCC)   Hyperlipidemia LDL goal <100   GERD   Fibromyalgia   Sleep apnea   Essential hypertension, benign   COPD, moderate (HCC)   Pyelonephritis  Resolving sepsis secondary to acute left pyelonephritis Presented with leukocytosis with WBC 20,000, fever with T-max of 102.8, tachycardia, and tachypnea Left-kidney with perinephric fat stranding noted on CT abdomen pelvis suspicious for pyelonephritis Completed 4 days of IV Levaquin due to penicillin allergy Switch to oral Levaquin on 12/27/2018 for total of 10 days of antibiotics course Completed gentle IV hydration with normal saline at 75 cc/h Urine culture grew greater than 100,000 colonies of Klebsiella pneumoniae Resistance to ampicillin and sensitive to Cipro Blood cultures x2 no growth to date Follow-up with your PCP  Left distal femur fracture nonunion Now greater than 6 months postop Recommendations per orthopedic surgery Patient has declined SNF Continue physical therapy with weightbearing as tolerated Will begin bone stimulator for 3 months as outpatient Follow-up with orthopedic surgery outpatient Continue vitamin D replacement at 50,000 units weekly Fall precautions  Chronic normocytic anemia Hemoglobin at baseline appears to be 9   Hemoglobin appears stable at 7.7 from 7.7  Could be dilutional from IV fluid hydration No sign of overt  bleeding Follow-up with your PCP for possible anemia work-up  Resolving hypovolemic hyponatremia Presented with sodium of 129 Latest sodium level 135   Resolved hypokalemia post repletion  Resolved hypomagnesemia post repletion  Ambulatory dysfunction/physical debility PT recommended SNF Patient and husband declined SNF and requested home health services with PT Low vitamin D Continue vitamin D placement 50,000 U weekly Follow-up with your PCP and orthopedic surgeon Weightbearing as tolerated per orthopedic surgery  Hyperlipidemia LDL goal <100stable continue home medications  Severe morbid obesity BMI 48 Recommend weight loss outpatient with regular physical activity and healthy dieting  . GERDstable continue home medications . Fibromyalgiastable continue home medications . Sleep apneanot on CPAP . Hypotension: Not on any anti-hypertensive medications at home.  Maintain map greater than 65 . COPD, moderate (HCC)currently stable continue home medications    Code Status:FULL CODE      Discharge Exam: BP (!) 103/52 (BP Location: Right Arm)   Pulse 94   Temp 98.6 F (37 C) (Oral)   Resp 18   Ht 4' 10" (1.473 m)   Wt 104.3 kg   SpO2 95%   BMI 48.07 kg/m  . General: 58 y.o. year-old female well developed well nourished in no acute distress.  Alert and oriented x3. . Cardiovascular: Regular rate and rhythm with no rubs or gallops.  No thyromegaly or JVD noted.   . Respiratory: Clear to auscultation with no wheezes or rales. Good inspiratory effort. . Abdomen: Soft nontender nondistended with normal bowel sounds x4 quadrants. . Musculoskeletal: No lower extremity edema. 2/4 pulses in all 4 extremities. . Skin: No ulcerative lesions noted or rashes, . Psychiatry: Mood is appropriate for condition and setting  Discharge Instructions You were cared for by a hospitalist during your hospital stay. If you have any questions about your discharge  medications or the care you received while you were in the hospital after you are discharged, you can call the unit and asked to speak with the hospitalist on call if the hospitalist that took care of you is not available. Once you are discharged, your primary care physician will handle any further medical issues. Please note that NO REFILLS for any discharge medications will be authorized once you are discharged, as it is imperative that you return to your primary care physician (or establish a relationship with a primary care physician if you do not have one) for your aftercare needs so that they can reassess your need for medications and monitor your lab values.   Allergies as of 12/27/2018      Reactions   Benazepril Anaphylaxis, Swelling, Other (See Comments)   Angioedema, throat swelling   Diclofenac Sodium Other (See Comments)   GI bleed   Fluticasone-salmeterol Hives, Itching, Swelling   Tongue was swollen   Nsaids Other (See Comments)   Rectal bleeding   Penicillins Hives, Rash   Has patient had a PCN reaction causing immediate rash, facial/tongue/throat swelling, SOB or lightheadedness with hypotension: no Has patient had a PCN reaction causing severe rash involving mucus membranes or skin necrosis: no Has patient had a PCN reaction that required hospitalization no Has patient had a PCN reaction occurring within the last 10 years: no If all of the above answers are "NO", then may proceed with Cephalosporin use.   Morphine And Related    rash   Aspirin Other (See Comments)   Burns stomach     Propoxyphene Other (See Comments)   Darvocet - Headache      Medication List    STOP taking these medications   diphenhydramine-acetaminophen 25-500 MG Tabs tablet Commonly known as:  TYLENOL PM   SALONPAS PAIN RELIEF PATCH EX   sulfamethoxazole-trimethoprim 800-160 MG tablet Commonly known as:  BACTRIM DS,SEPTRA DS     TAKE these medications   ACCU-CHEK AVIVA PLUS test  strip Generic drug:  glucose blood TEST BLOOD SUGAR TWICE DAILY AND DIRECTED   ACCU-CHEK AVIVA PLUS w/Device Kit Check blood sugar twice daily and as directed.   ACCU-CHEK SOFTCLIX LANCETS lancets CHECK BLOOD SUGAR TWICE DAILY AND AS DIRECTED   Adjustable Lancing Device Misc Check blood sugar twice a day and as directed. Dx E11.9   AIMSCO INSULIN SYR ULTRA THIN 31G X 5/16" 0.3 ML Misc Generic drug:  Insulin Syringe-Needle U-100   Alcohol Swabs Pads Check blood sugar twice a day and as directed. Dx E11.9   ALPRAZolam 0.5 MG tablet Commonly known as:  XANAX Take 1 tablet (0.5 mg total) by mouth 3 (three) times daily. What changed:  Another medication with the same name was removed. Continue taking this medication, and follow the directions you see here.   amitriptyline 75 MG tablet Commonly known as:  ELAVIL Take 1 tablet (75 mg total) by mouth at bedtime.   B-D ULTRAFINE III SHORT PEN 31G X 8 MM Misc Generic drug:  Insulin Pen Needle USE AS DIRECTED WITH INSULIN PEN   budesonide-formoterol 160-4.5 MCG/ACT inhaler Commonly known as:  SYMBICORT INHALE 2 PUFFS INTO THE LUNGS TWICE DAILY   dicyclomine 20 MG tablet Commonly known as:  BENTYL Take 1 tablet (20 mg total) 2 (two) times daily by mouth.   DRAMAMINE PO Take 1 tablet by mouth daily as needed (nausea).   feeding supplement (GLUCERNA SHAKE) Liqd Take 237 mLs by mouth 2 (two) times daily between meals.   ferrous sulfate 325 (65 FE) MG tablet Take 325 mg by mouth daily with breakfast.   furosemide 20 MG tablet Commonly known as:  LASIX TAKE 1 TABLET(20 MG) BY MOUTH DAILY What changed:  See the new instructions.   gabapentin 300 MG capsule Commonly known as:  NEURONTIN TAKE 1 CAPSULE BY MOUTH IN THE MORNING, 1 CAPSULE IN THE AFTERNOON AND 2 CAPSULES AT BEDTIME What changed:  See the new instructions.   gemfibrozil 600 MG tablet Commonly known as:  LOPID TAKE 1 TABLET BY MOUTH TWICE DAILY What changed:   when to take this   HYDROcodone-acetaminophen 5-325 MG tablet Commonly known as:  NORCO/VICODIN Take 1-2 tablets by mouth every 4 (four) hours as needed for moderate pain (pain score 4-6). What changed:    when to take this  additional instructions   Insulin Glargine 100 UNIT/ML Solostar Pen Commonly known as:  LANTUS SOLOSTAR ADMINISTER 35 UNITS UNDER THE SKIN AT BEDTIME What changed:    how much to take  how to take this  when to take this  additional instructions   levofloxacin 750 MG tablet Commonly known as:  LEVAQUIN Take 1 tablet (750 mg total) by mouth at bedtime for 6 days.   loperamide 2 MG tablet Commonly known as:  IMODIUM A-D Take 2 mg by mouth 4 (four) times daily as needed for diarrhea or loose stools.   metFORMIN 1000 MG tablet Commonly known as:  GLUCOPHAGE TAKE 1 TABLET BY MOUTH TWICE DAILY WITH MEALS   methocarbamol 500 MG tablet Commonly known as:  ROBAXIN Take 2  tablets (1,000 mg total) by mouth at bedtime. What changed:    when to take this  reasons to take this   omeprazole 20 MG tablet Commonly known as:  PRILOSEC OTC Take 40 mg by mouth daily.   OVER THE COUNTER MEDICATION Apply 1 application topically daily as needed. Hempvana   pioglitazone 45 MG tablet Commonly known as:  ACTOS TAKE 1 TABLET BY MOUTH EVERY DAY   polyethylene glycol packet Commonly known as:  MIRALAX / GLYCOLAX Take 17 g by mouth daily.   PROAIR HFA 108 (90 Base) MCG/ACT inhaler Generic drug:  albuterol TAKE 2 PUFFS BY MOUTH EVERY 4 HOURS AS NEEDED FOR WHEEZE OR FOR SHORTNESS OF BREATH What changed:  See the new instructions.   Vitamin D (Ergocalciferol) 1.25 MG (50000 UT) Caps capsule Commonly known as:  DRISDOL Take 1 capsule (50,000 Units total) by mouth every 7 (seven) days. Start taking on:  January 03, 2019      Allergies  Allergen Reactions  . Benazepril Anaphylaxis, Swelling and Other (See Comments)    Angioedema, throat swelling  .  Diclofenac Sodium Other (See Comments)    GI bleed  . Fluticasone-Salmeterol Hives, Itching and Swelling    Tongue was swollen  . Nsaids Other (See Comments)    Rectal bleeding  . Penicillins Hives and Rash    Has patient had a PCN reaction causing immediate rash, facial/tongue/throat swelling, SOB or lightheadedness with hypotension: no Has patient had a PCN reaction causing severe rash involving mucus membranes or skin necrosis: no Has patient had a PCN reaction that required hospitalization no Has patient had a PCN reaction occurring within the last 10 years: no If all of the above answers are "NO", then may proceed with Cephalosporin use.   Marland Kitchen Morphine And Related     rash  . Aspirin Other (See Comments)    Burns stomach  . Propoxyphene Other (See Comments)    Darvocet - Headache   Follow-up Information    Milly Jakob, MD. Call.   Specialty:  Orthopedic Surgery Why:  my office will call you to make the next appointment, for approximately one month after beginning use of the bone stimulator Contact information: [place] [address] [phone]        Jinny Sanders, MD. Call in 1 day(s).   Specialty:  Family Medicine Why:  Please call for a post hospital follow-up appointment. Contact information: [place] [address] [phone]            The results of significant diagnostics from this hospitalization (including imaging, microbiology, ancillary and laboratory) are listed below for reference.    Significant Diagnostic Studies: Dg Chest 2 View  Result Date: 12/24/2018 CLINICAL DATA:  58 year old female with suspected sepsis. EXAM: CHEST - 2 VIEW COMPARISON:  Chest radiographs 06/28/2018 and earlier. FINDINGS: Semi upright AP and lateral views. Stable lung volumes and mediastinal contours. Cardiac size at the upper limits of normal. Visualized tracheal air column is within normal limits. Both lungs appear clear. No  pneumothorax or pleural effusion. Multilevel prior cervical spine fusion. No acute osseous abnormality identified. Negative visible bowel gas pattern. IMPRESSION: No acute cardiopulmonary abnormality. Electronically Signed   By: Genevie Ann M.D.   On: 12/24/2018 20:29   Ct Abdomen Pelvis W Contrast  Result Date: 12/24/2018 CLINICAL DATA:  Abdominal pain with fever EXAM: CT ABDOMEN AND PELVIS WITH CONTRAST TECHNIQUE: Multidetector CT imaging of the abdomen and pelvis was performed  using the standard protocol following bolus administration of intravenous contrast. CONTRAST:  100mL ISOVUE-300 IOPAMIDOL (ISOVUE-300) INJECTION 61% COMPARISON:  09/24/2017 FINDINGS: Lower chest: The included heart is borderline enlarged. No pericardial effusion. Subsegmental atelectasis and/or scarring is noted at each lung base. Hepatobiliary: No focal liver abnormality is seen. Status post cholecystectomy. No biliary dilatation. Pancreas: Unremarkable. No pancreatic ductal dilatation or surrounding inflammatory changes. Spleen: Normal in size without focal abnormality. Adrenals/Urinary Tract: Normal bilateral adrenal glands and right kidney. In homogeneous enhancement of the left kidney with perinephric fat stranding in a pattern suspicious for pyelonephritis and lobar nephronia. The urinary bladder is thick-walled in appearance with mucosal enhancement consistent with cystitis. Stomach/Bowel: Stomach is within normal limits. Appendix is surgically absent. No evidence of bowel wall thickening, distention, or inflammatory changes. Vascular/Lymphatic: Aortoiliac atherosclerosis. No lymphadenopathy. Reproductive: Uterus and bilateral adnexa are unremarkable. Other: No abdominal wall hernia or abnormality. No abdominopelvic ascites. Musculoskeletal: Thoracolumbar spondylosis with L4-5 PLIF procedure. IMPRESSION: 1. Heterogeneous enhancement of the left kidney with perinephric fat stranding suspicious for pyelonephritis and lobar nephronia.  2. Thick-walled appearance of the urinary bladder with mucosal enhancement consistent with cystitis. 3. Thoracolumbar spondylosis with L4-5 PLIF procedure. Electronically Signed   By: David  Kwon M.D.   On: 12/24/2018 22:43   Ct Femur Left Wo Contrast  Result Date: 12/25/2018 CLINICAL DATA:  Intramedullary nail fixation of distal comminuted femoral fracture. Assess for healing. EXAM: CT OF THE LOWER LEFT EXTREMITY WITHOUT CONTRAST TECHNIQUE: Multidetector CT imaging of the lower left extremity was performed according to the standard protocol. COMPARISON:  Preop radiographs of the knee 06/28/2018 FINDINGS: Bones/Joint/Cartilage A reverse intramedullary nail fixation across a comminuted distal diaphyseal fracture of the left femur is identified. Faint callus formation is identified about the comminuted fracture without significant bony bridging. No hardware failure. No new fracture. Disuse osteopenia is noted. Osteoarthritis of the knee with bone-on-bone apposition of the medial femoral condyle with tibial plateau is identified. Moderate joint space narrowing of the native left hip. No new fracture or suspicious osseous lesions. Ligaments Suboptimally assessed by CT. Muscles and Tendons Mild generalized atrophy without intramuscular mass or hemorrhage. Soft tissues Focal soft tissue swelling or fluid collection. IMPRESSION: 1. A reverse intramedullary nail fixation across a comminuted distal diaphyseal fracture of the left femur is identified. Faint callus formation is identified about the comminuted fracture without significant bony bridging. No hardware failure. 2. Osteoarthritis of the knee with bone-on-bone apposition of the medial femoral condyle and tibial plateau. 3. Disuse osteopenia and mild generalized muscular atrophy of the thigh. Electronically Signed   By: David  Kwon M.D.   On: 12/25/2018 19:49   Dg Femur Min 2 Views Left  Result Date: 12/25/2018 CLINICAL DATA:  Follow-up femur fracture EXAM:  LEFT FEMUR 2 VIEWS COMPARISON:  06/30/2018 FINDINGS: Left femoral head projects in joint. Status post intramedullary rod with proximal and distal screw fixation of the femur across comminuted distal femoral fracture. Slight increased displacement at the fracture site. There is possible lucency around the distal fixating screw with the screw head protruding beyond the anterior cortical margin of the femur. There is some callus formation evident with less distinct appearance of the fracture lines. IMPRESSION: 1. Surgically fixated distal femoral fracture with some callus formation and less distinct fracture lines as compared with prior radiograph. 2. There is slight increased displacement at the site of fracture 3. Possible lucency around the distal fixating screw. Electronically Signed   By: Kim  Fujinaga M.D.   On: 12/25/2018 20:16      Microbiology: Recent Results (from the past 240 hour(s))  Culture, blood (Routine x 2)     Status: None (Preliminary result)   Collection Time: 12/24/18  7:47 PM  Result Value Ref Range Status   Specimen Description BLOOD BLOOD LEFT FOREARM  Final   Special Requests   Final    BOTTLES DRAWN AEROBIC AND ANAEROBIC Blood Culture adequate volume Performed at [facility], [address] [place], [place] [address]    Culture NO GROWTH 2 DAYS  Final   Report Status PENDING  Incomplete  Urine culture     Status: Abnormal   Collection Time: 12/24/18  7:48 PM  Result Value Ref Range Status   Specimen Description   Final    URINE, RANDOM Performed at [facility], [address] [place], [place] [address]    Special Requests   Final    NONE Performed at [facility], [address] [place], [place] [address]    Culture >=100,000 COLONIES/mL KLEBSIELLA PNEUMONIAE (A)  Final   Report Status 12/27/2018 FINAL  Final   Organism ID, Bacteria KLEBSIELLA PNEUMONIAE (A)  Final      Susceptibility   Klebsiella  pneumoniae - MIC*    AMPICILLIN >=32 RESISTANT Resistant     CEFAZOLIN <=4 SENSITIVE Sensitive     CEFTRIAXONE <=1 SENSITIVE Sensitive     CIPROFLOXACIN <=0.25 SENSITIVE Sensitive     GENTAMICIN <=1 SENSITIVE Sensitive     IMIPENEM <=0.25 SENSITIVE Sensitive     NITROFURANTOIN 32 SENSITIVE Sensitive     TRIMETH/SULFA <=20 SENSITIVE Sensitive     AMPICILLIN/SULBACTAM 8 SENSITIVE Sensitive     PIP/TAZO <=4 SENSITIVE Sensitive     Extended ESBL NEGATIVE Sensitive     * >=100,000 COLONIES/mL KLEBSIELLA PNEUMONIAE  Culture, blood (Routine x 2)     Status: None (Preliminary result)   Collection Time: 12/24/18  7:52 PM  Result Value Ref Range Status   Specimen Description BLOOD RIGHT WRIST  Final   Special Requests   Final    BOTTLES DRAWN AEROBIC AND ANAEROBIC Blood Culture results may not be optimal due to an excessive volume of blood received in culture bottles Performed at [facility], [address] , [place] [address]    Culture NO GROWTH 2 DAYS  Final   Report Status PENDING  Incomplete     Labs: Basic Metabolic Panel: Recent Labs  Lab 12/24/18 1947 12/25/18 0407 12/26/18 0706  NA 129* 134* 135  K 3.3* 3.1* 4.2  CL 97* 102 105  CO2 23 24 23  GLUCOSE 243* 139* 116*  BUN 13 12 7  CREATININE 0.77 0.75 0.59  CALCIUM 7.6* 7.5* 8.0*  MG  --  1.2*  1.3* 1.9  PHOS  --  3.1  3.1  --    Liver Function Tests: Recent Labs  Lab 12/24/18 1947 12/25/18 0407  AST 8* 8*  ALT 7 7  ALKPHOS 81 82  BILITOT 0.5 0.4  PROT 7.4 6.5  ALBUMIN 2.4* 2.0*   No results for input(s): LIPASE, AMYLASE in the last 168 hours. No results for input(s): AMMONIA in the last 168 hours. CBC: Recent Labs  Lab 12/24/18 1947 12/25/18 0407 12/26/18 0706  WBC 20.1* 18.2* 9.9  NEUTROABS 15.8*  --  6.8  HGB 9.4* 7.7* 7.7*  HCT 31.3* 25.8* 26.1*  MCV 90.7 91.5 93.2  PLT 392 295 297   Cardiac Enzymes: No results for input(s): CKTOTAL, CKMB, CKMBINDEX, TROPONINI in    the last 168 hours. BNP: BNP (last 3 results) No results for input(s): BNP in the last 8760 hours.  ProBNP (last 3 results) No results for input(s): PROBNP in the last 8760 hours.  CBG: Recent Labs  Lab 12/26/18 2028 12/27/18 0010 12/27/18 0443 12/27/18 0716 12/27/18 1112  GLUCAP 313* 290* 271* 232* 233*       Signed:   N , MD [facility] 12/27/2018, 2:32 PM 

## 2018-12-29 ENCOUNTER — Telehealth: Payer: Self-pay

## 2018-12-29 DIAGNOSIS — I5030 Unspecified diastolic (congestive) heart failure: Secondary | ICD-10-CM | POA: Diagnosis not present

## 2018-12-29 DIAGNOSIS — J449 Chronic obstructive pulmonary disease, unspecified: Secondary | ICD-10-CM | POA: Diagnosis not present

## 2018-12-29 DIAGNOSIS — M47815 Spondylosis without myelopathy or radiculopathy, thoracolumbar region: Secondary | ICD-10-CM | POA: Diagnosis not present

## 2018-12-29 DIAGNOSIS — M797 Fibromyalgia: Secondary | ICD-10-CM | POA: Diagnosis not present

## 2018-12-29 DIAGNOSIS — E119 Type 2 diabetes mellitus without complications: Secondary | ICD-10-CM | POA: Diagnosis not present

## 2018-12-29 DIAGNOSIS — M1990 Unspecified osteoarthritis, unspecified site: Secondary | ICD-10-CM | POA: Diagnosis not present

## 2018-12-29 DIAGNOSIS — K219 Gastro-esophageal reflux disease without esophagitis: Secondary | ICD-10-CM | POA: Diagnosis not present

## 2018-12-29 DIAGNOSIS — E785 Hyperlipidemia, unspecified: Secondary | ICD-10-CM | POA: Diagnosis not present

## 2018-12-29 DIAGNOSIS — B961 Klebsiella pneumoniae [K. pneumoniae] as the cause of diseases classified elsewhere: Secondary | ICD-10-CM | POA: Diagnosis not present

## 2018-12-29 DIAGNOSIS — D649 Anemia, unspecified: Secondary | ICD-10-CM | POA: Diagnosis not present

## 2018-12-29 DIAGNOSIS — S72402K Unspecified fracture of lower end of left femur, subsequent encounter for closed fracture with nonunion: Secondary | ICD-10-CM | POA: Diagnosis not present

## 2018-12-29 DIAGNOSIS — G894 Chronic pain syndrome: Secondary | ICD-10-CM | POA: Diagnosis not present

## 2018-12-29 DIAGNOSIS — Z7951 Long term (current) use of inhaled steroids: Secondary | ICD-10-CM | POA: Diagnosis not present

## 2018-12-29 DIAGNOSIS — N10 Acute tubulo-interstitial nephritis: Secondary | ICD-10-CM | POA: Diagnosis not present

## 2018-12-29 DIAGNOSIS — I110 Hypertensive heart disease with heart failure: Secondary | ICD-10-CM | POA: Diagnosis not present

## 2018-12-29 DIAGNOSIS — Z7984 Long term (current) use of oral hypoglycemic drugs: Secondary | ICD-10-CM | POA: Diagnosis not present

## 2018-12-29 DIAGNOSIS — Z981 Arthrodesis status: Secondary | ICD-10-CM | POA: Diagnosis not present

## 2018-12-29 LAB — CULTURE, BLOOD (ROUTINE X 2)
CULTURE: NO GROWTH
Culture: NO GROWTH
SPECIAL REQUESTS: ADEQUATE

## 2018-12-29 NOTE — Telephone Encounter (Signed)
TCM attempt #1 - pt discharged from [facility] with admitting dx of sepsis without acute organ dysfunction. Contacted pt to complete TCM and to schedule HFU appt.  Transitional Care Management Follow-up Telephone Call    Date discharged? 12/27/18  How have you been since you were released from the hospital? Maunie.   Any patient concerns? Able to ambulate with wheelchair only. Unable to transfer into automobile for doctor appts at this time.    Items Reviewed:  Medications reviewed: Yes  Allergies reviewed: Yes  Dietary changes reviewed: Yes  Referrals reviewed: Yes   Functional Questionnaire:  Independent - I Dependent - D    Activities of Daily Living (ADLs):    Personal hygiene - I Dressing - I Eating - I Maintaining continence - I Transferring - D  Independent Activities of Daily Living (iADLs): Basic communication skills - I Transportation - D Meal preparation  - D Shopping - D Housework - D Managing medications - D Managing personal finances - D   Confirmed importance and date/time of follow-up visits scheduled YES  Provider Appointment booked with PCP (having transportation concerns)  Confirmed with patient if condition begins to worsen call PCP or go to the ER.  Patient was given the office number and encouraged to call back with question or concerns: YES

## 2018-12-29 NOTE — Telephone Encounter (Signed)
Museum/gallery conservator, Therapist, sports, from [employer] requested verbal orders for skilled nursing for patient education related to DM and UTI as follows:  Twice weekly x 2 weeks Once weekly x 2 weeks  States PT will contact office for more specific orders once evaluation is completed on 12/30/08.

## 2018-12-29 NOTE — Telephone Encounter (Signed)
Amber did not provide a contact phone number. She was in patient's home providing care when TCM call was being completed. [facility]'s main number is [phone].

## 2018-12-29 NOTE — Telephone Encounter (Signed)
Verbal orders given to Margarita Grizzle at [facility] for skilled nursing for patient education related to UTI and DM 2 x a week for 2 weeks and then 1 x week for 2 weeks.

## 2018-12-29 NOTE — Telephone Encounter (Signed)
Do you have phone number for Amber, RN with [facility]???

## 2018-12-30 ENCOUNTER — Other Ambulatory Visit: Payer: Self-pay

## 2018-12-30 ENCOUNTER — Telehealth: Payer: Self-pay

## 2018-12-30 DIAGNOSIS — J449 Chronic obstructive pulmonary disease, unspecified: Secondary | ICD-10-CM | POA: Diagnosis not present

## 2018-12-30 DIAGNOSIS — E785 Hyperlipidemia, unspecified: Secondary | ICD-10-CM | POA: Diagnosis not present

## 2018-12-30 DIAGNOSIS — I110 Hypertensive heart disease with heart failure: Secondary | ICD-10-CM | POA: Diagnosis not present

## 2018-12-30 DIAGNOSIS — M47815 Spondylosis without myelopathy or radiculopathy, thoracolumbar region: Secondary | ICD-10-CM | POA: Diagnosis not present

## 2018-12-30 DIAGNOSIS — G894 Chronic pain syndrome: Secondary | ICD-10-CM | POA: Diagnosis not present

## 2018-12-30 DIAGNOSIS — N10 Acute tubulo-interstitial nephritis: Secondary | ICD-10-CM | POA: Diagnosis not present

## 2018-12-30 DIAGNOSIS — S72402K Unspecified fracture of lower end of left femur, subsequent encounter for closed fracture with nonunion: Secondary | ICD-10-CM | POA: Diagnosis not present

## 2018-12-30 DIAGNOSIS — E119 Type 2 diabetes mellitus without complications: Secondary | ICD-10-CM | POA: Diagnosis not present

## 2018-12-30 DIAGNOSIS — Z7984 Long term (current) use of oral hypoglycemic drugs: Secondary | ICD-10-CM | POA: Diagnosis not present

## 2018-12-30 DIAGNOSIS — Z7951 Long term (current) use of inhaled steroids: Secondary | ICD-10-CM | POA: Diagnosis not present

## 2018-12-30 DIAGNOSIS — K219 Gastro-esophageal reflux disease without esophagitis: Secondary | ICD-10-CM | POA: Diagnosis not present

## 2018-12-30 DIAGNOSIS — Z981 Arthrodesis status: Secondary | ICD-10-CM | POA: Diagnosis not present

## 2018-12-30 DIAGNOSIS — M1990 Unspecified osteoarthritis, unspecified site: Secondary | ICD-10-CM | POA: Diagnosis not present

## 2018-12-30 DIAGNOSIS — M797 Fibromyalgia: Secondary | ICD-10-CM | POA: Diagnosis not present

## 2018-12-30 DIAGNOSIS — B961 Klebsiella pneumoniae [K. pneumoniae] as the cause of diseases classified elsewhere: Secondary | ICD-10-CM | POA: Diagnosis not present

## 2018-12-30 DIAGNOSIS — I5030 Unspecified diastolic (congestive) heart failure: Secondary | ICD-10-CM | POA: Diagnosis not present

## 2018-12-30 DIAGNOSIS — D649 Anemia, unspecified: Secondary | ICD-10-CM | POA: Diagnosis not present

## 2018-12-30 NOTE — Telephone Encounter (Signed)
Brandon with [facility] left v/m requesting [facility] OT verbal orders for 2 x a wk for 3 wks and 1 x a wk for 1 wk.

## 2018-12-30 NOTE — Patient Outreach (Signed)
[facility]) Care Management  12/30/2018  Sandra Ferrell 09-26-1961 [id number]   EMMI- General Discharge RED ON EMMI ALERT Day # 1 Date: 12/29/2018 Red Alert Reason:  Read discharge papers? No  Scheduled follow-up? No  Unfilled prescriptions? Yes    Outreach attempt: spoke with patient and spouse.  Spouse is able to verify HIPAA.  He states that patient has glanced her discharge summary but will be reading it entirely.  Read discharge instructions with patient and spouse.  They voice concern about iron pill not being prescribed.  Spouse states they talked with someone at the physician office on yesterday and forgot to mention but states that someone will be calling today for follow up and they will mention it then.  Asked about a follow up appointment. Spouse states that patient unable to get in the car as she is weak but Dr. Rometta Emery office is aware and will be getting back with them about her appointment.  Discussed with spouse about transportation utilizing [facility].  He states they have used that but they don't like that they have to wait and not knowing who will pick them up.  He is appreciative of the information but looking forward to patient getting strong enough to get in the car so he can take her.  Inquired about patient medication.  He states that he picked up all patient medications and she is taking with no problems.  He states that [place] came out on yesterday and that someone is coming this afternoon.  Spouse declines any other questions or needs at this time.     Plan: RN CM will close case.    Sandra Baseman, RN, MSN [facility] Care Management Care Management Coordinator Direct Line [phone] Toll Free: [phone]  Fax: [phone]

## 2018-12-31 DIAGNOSIS — Z7951 Long term (current) use of inhaled steroids: Secondary | ICD-10-CM | POA: Diagnosis not present

## 2018-12-31 DIAGNOSIS — D649 Anemia, unspecified: Secondary | ICD-10-CM | POA: Diagnosis not present

## 2018-12-31 DIAGNOSIS — N10 Acute tubulo-interstitial nephritis: Secondary | ICD-10-CM | POA: Diagnosis not present

## 2018-12-31 DIAGNOSIS — I110 Hypertensive heart disease with heart failure: Secondary | ICD-10-CM | POA: Diagnosis not present

## 2018-12-31 DIAGNOSIS — S72402K Unspecified fracture of lower end of left femur, subsequent encounter for closed fracture with nonunion: Secondary | ICD-10-CM | POA: Diagnosis not present

## 2018-12-31 DIAGNOSIS — G894 Chronic pain syndrome: Secondary | ICD-10-CM | POA: Diagnosis not present

## 2018-12-31 DIAGNOSIS — Z981 Arthrodesis status: Secondary | ICD-10-CM | POA: Diagnosis not present

## 2018-12-31 DIAGNOSIS — M1990 Unspecified osteoarthritis, unspecified site: Secondary | ICD-10-CM | POA: Diagnosis not present

## 2018-12-31 DIAGNOSIS — E785 Hyperlipidemia, unspecified: Secondary | ICD-10-CM | POA: Diagnosis not present

## 2018-12-31 DIAGNOSIS — J449 Chronic obstructive pulmonary disease, unspecified: Secondary | ICD-10-CM | POA: Diagnosis not present

## 2018-12-31 DIAGNOSIS — Z7984 Long term (current) use of oral hypoglycemic drugs: Secondary | ICD-10-CM | POA: Diagnosis not present

## 2018-12-31 DIAGNOSIS — M797 Fibromyalgia: Secondary | ICD-10-CM | POA: Diagnosis not present

## 2018-12-31 DIAGNOSIS — E119 Type 2 diabetes mellitus without complications: Secondary | ICD-10-CM | POA: Diagnosis not present

## 2018-12-31 DIAGNOSIS — I5030 Unspecified diastolic (congestive) heart failure: Secondary | ICD-10-CM | POA: Diagnosis not present

## 2018-12-31 DIAGNOSIS — K219 Gastro-esophageal reflux disease without esophagitis: Secondary | ICD-10-CM | POA: Diagnosis not present

## 2018-12-31 DIAGNOSIS — B961 Klebsiella pneumoniae [K. pneumoniae] as the cause of diseases classified elsewhere: Secondary | ICD-10-CM | POA: Diagnosis not present

## 2018-12-31 DIAGNOSIS — M47815 Spondylosis without myelopathy or radiculopathy, thoracolumbar region: Secondary | ICD-10-CM | POA: Diagnosis not present

## 2019-01-01 DIAGNOSIS — Z7984 Long term (current) use of oral hypoglycemic drugs: Secondary | ICD-10-CM | POA: Diagnosis not present

## 2019-01-01 DIAGNOSIS — I110 Hypertensive heart disease with heart failure: Secondary | ICD-10-CM | POA: Diagnosis not present

## 2019-01-01 DIAGNOSIS — K219 Gastro-esophageal reflux disease without esophagitis: Secondary | ICD-10-CM | POA: Diagnosis not present

## 2019-01-01 DIAGNOSIS — G894 Chronic pain syndrome: Secondary | ICD-10-CM | POA: Diagnosis not present

## 2019-01-01 DIAGNOSIS — E785 Hyperlipidemia, unspecified: Secondary | ICD-10-CM | POA: Diagnosis not present

## 2019-01-01 DIAGNOSIS — B961 Klebsiella pneumoniae [K. pneumoniae] as the cause of diseases classified elsewhere: Secondary | ICD-10-CM | POA: Diagnosis not present

## 2019-01-01 DIAGNOSIS — D649 Anemia, unspecified: Secondary | ICD-10-CM | POA: Diagnosis not present

## 2019-01-01 DIAGNOSIS — M47815 Spondylosis without myelopathy or radiculopathy, thoracolumbar region: Secondary | ICD-10-CM | POA: Diagnosis not present

## 2019-01-01 DIAGNOSIS — N10 Acute tubulo-interstitial nephritis: Secondary | ICD-10-CM | POA: Diagnosis not present

## 2019-01-01 DIAGNOSIS — M1990 Unspecified osteoarthritis, unspecified site: Secondary | ICD-10-CM | POA: Diagnosis not present

## 2019-01-01 DIAGNOSIS — I5030 Unspecified diastolic (congestive) heart failure: Secondary | ICD-10-CM | POA: Diagnosis not present

## 2019-01-01 DIAGNOSIS — J449 Chronic obstructive pulmonary disease, unspecified: Secondary | ICD-10-CM | POA: Diagnosis not present

## 2019-01-01 DIAGNOSIS — Z7951 Long term (current) use of inhaled steroids: Secondary | ICD-10-CM | POA: Diagnosis not present

## 2019-01-01 DIAGNOSIS — E119 Type 2 diabetes mellitus without complications: Secondary | ICD-10-CM | POA: Diagnosis not present

## 2019-01-01 DIAGNOSIS — S72402K Unspecified fracture of lower end of left femur, subsequent encounter for closed fracture with nonunion: Secondary | ICD-10-CM | POA: Diagnosis not present

## 2019-01-01 DIAGNOSIS — Z981 Arthrodesis status: Secondary | ICD-10-CM | POA: Diagnosis not present

## 2019-01-01 DIAGNOSIS — M797 Fibromyalgia: Secondary | ICD-10-CM | POA: Diagnosis not present

## 2019-01-02 ENCOUNTER — Other Ambulatory Visit: Payer: Self-pay

## 2019-01-02 NOTE — Patient Outreach (Signed)
[facility]) Care Management  01/02/2019  Sandra Ferrell 1961/07/18 [id number]   EMMI- general discharge RED ON EMMI ALERT Day # 4 Date:  01/01/2019 Red Alert Reason:  Scheduled follow up appointment? no  Outreach attempt: Addressed red flag on 12/30/2018 no need for outreach today.   Plan: RN CM will close case.    Jone Baseman, RN, MSN [facility] Care Management Care Management Coordinator Direct Line [phone] Toll Free: [phone]  Fax: [phone]

## 2019-01-05 ENCOUNTER — Telehealth: Payer: Self-pay | Admitting: Family Medicine

## 2019-01-05 NOTE — Telephone Encounter (Signed)
Need verbal for PT   1w  2 2w  2

## 2019-01-05 NOTE — Telephone Encounter (Signed)
Spoke with Janine with [facility].  She is requesting PT 1 x week for 2 weeks,  then 2 x week for 3 weeks.  Verbal okay given via telephone.

## 2019-01-06 ENCOUNTER — Other Ambulatory Visit: Payer: Self-pay | Admitting: Family Medicine

## 2019-01-06 DIAGNOSIS — B961 Klebsiella pneumoniae [K. pneumoniae] as the cause of diseases classified elsewhere: Secondary | ICD-10-CM | POA: Diagnosis not present

## 2019-01-06 DIAGNOSIS — N10 Acute tubulo-interstitial nephritis: Secondary | ICD-10-CM | POA: Diagnosis not present

## 2019-01-06 DIAGNOSIS — Z981 Arthrodesis status: Secondary | ICD-10-CM | POA: Diagnosis not present

## 2019-01-06 DIAGNOSIS — M1990 Unspecified osteoarthritis, unspecified site: Secondary | ICD-10-CM | POA: Diagnosis not present

## 2019-01-06 DIAGNOSIS — S72402K Unspecified fracture of lower end of left femur, subsequent encounter for closed fracture with nonunion: Secondary | ICD-10-CM | POA: Diagnosis not present

## 2019-01-06 DIAGNOSIS — Z7984 Long term (current) use of oral hypoglycemic drugs: Secondary | ICD-10-CM | POA: Diagnosis not present

## 2019-01-06 DIAGNOSIS — Z7951 Long term (current) use of inhaled steroids: Secondary | ICD-10-CM | POA: Diagnosis not present

## 2019-01-06 DIAGNOSIS — J449 Chronic obstructive pulmonary disease, unspecified: Secondary | ICD-10-CM | POA: Diagnosis not present

## 2019-01-06 DIAGNOSIS — M47815 Spondylosis without myelopathy or radiculopathy, thoracolumbar region: Secondary | ICD-10-CM | POA: Diagnosis not present

## 2019-01-06 DIAGNOSIS — I110 Hypertensive heart disease with heart failure: Secondary | ICD-10-CM | POA: Diagnosis not present

## 2019-01-06 DIAGNOSIS — E119 Type 2 diabetes mellitus without complications: Secondary | ICD-10-CM | POA: Diagnosis not present

## 2019-01-06 DIAGNOSIS — E785 Hyperlipidemia, unspecified: Secondary | ICD-10-CM | POA: Diagnosis not present

## 2019-01-06 DIAGNOSIS — I5030 Unspecified diastolic (congestive) heart failure: Secondary | ICD-10-CM | POA: Diagnosis not present

## 2019-01-06 DIAGNOSIS — M797 Fibromyalgia: Secondary | ICD-10-CM | POA: Diagnosis not present

## 2019-01-06 DIAGNOSIS — D649 Anemia, unspecified: Secondary | ICD-10-CM | POA: Diagnosis not present

## 2019-01-06 DIAGNOSIS — G894 Chronic pain syndrome: Secondary | ICD-10-CM | POA: Diagnosis not present

## 2019-01-06 DIAGNOSIS — K219 Gastro-esophageal reflux disease without esophagitis: Secondary | ICD-10-CM | POA: Diagnosis not present

## 2019-01-06 NOTE — Telephone Encounter (Signed)
Last office visit 06/20/2018 for [place].  Last refilled 07/03/2018 for #15 with no refills. No future appointments.

## 2019-01-08 DIAGNOSIS — M1990 Unspecified osteoarthritis, unspecified site: Secondary | ICD-10-CM | POA: Diagnosis not present

## 2019-01-08 DIAGNOSIS — E119 Type 2 diabetes mellitus without complications: Secondary | ICD-10-CM | POA: Diagnosis not present

## 2019-01-08 DIAGNOSIS — J449 Chronic obstructive pulmonary disease, unspecified: Secondary | ICD-10-CM | POA: Diagnosis not present

## 2019-01-08 DIAGNOSIS — I5030 Unspecified diastolic (congestive) heart failure: Secondary | ICD-10-CM | POA: Diagnosis not present

## 2019-01-08 DIAGNOSIS — M797 Fibromyalgia: Secondary | ICD-10-CM | POA: Diagnosis not present

## 2019-01-08 DIAGNOSIS — B961 Klebsiella pneumoniae [K. pneumoniae] as the cause of diseases classified elsewhere: Secondary | ICD-10-CM | POA: Diagnosis not present

## 2019-01-08 DIAGNOSIS — D649 Anemia, unspecified: Secondary | ICD-10-CM | POA: Diagnosis not present

## 2019-01-08 DIAGNOSIS — K219 Gastro-esophageal reflux disease without esophagitis: Secondary | ICD-10-CM | POA: Diagnosis not present

## 2019-01-08 DIAGNOSIS — Z7984 Long term (current) use of oral hypoglycemic drugs: Secondary | ICD-10-CM | POA: Diagnosis not present

## 2019-01-08 DIAGNOSIS — Z981 Arthrodesis status: Secondary | ICD-10-CM | POA: Diagnosis not present

## 2019-01-08 DIAGNOSIS — I110 Hypertensive heart disease with heart failure: Secondary | ICD-10-CM | POA: Diagnosis not present

## 2019-01-08 DIAGNOSIS — G894 Chronic pain syndrome: Secondary | ICD-10-CM | POA: Diagnosis not present

## 2019-01-08 DIAGNOSIS — Z7951 Long term (current) use of inhaled steroids: Secondary | ICD-10-CM | POA: Diagnosis not present

## 2019-01-08 DIAGNOSIS — M47815 Spondylosis without myelopathy or radiculopathy, thoracolumbar region: Secondary | ICD-10-CM | POA: Diagnosis not present

## 2019-01-08 DIAGNOSIS — E785 Hyperlipidemia, unspecified: Secondary | ICD-10-CM | POA: Diagnosis not present

## 2019-01-08 DIAGNOSIS — N10 Acute tubulo-interstitial nephritis: Secondary | ICD-10-CM | POA: Diagnosis not present

## 2019-01-08 DIAGNOSIS — S72402K Unspecified fracture of lower end of left femur, subsequent encounter for closed fracture with nonunion: Secondary | ICD-10-CM | POA: Diagnosis not present

## 2019-01-09 DIAGNOSIS — I5030 Unspecified diastolic (congestive) heart failure: Secondary | ICD-10-CM | POA: Diagnosis not present

## 2019-01-09 DIAGNOSIS — Z981 Arthrodesis status: Secondary | ICD-10-CM | POA: Diagnosis not present

## 2019-01-09 DIAGNOSIS — N10 Acute tubulo-interstitial nephritis: Secondary | ICD-10-CM | POA: Diagnosis not present

## 2019-01-09 DIAGNOSIS — I110 Hypertensive heart disease with heart failure: Secondary | ICD-10-CM | POA: Diagnosis not present

## 2019-01-09 DIAGNOSIS — D649 Anemia, unspecified: Secondary | ICD-10-CM | POA: Diagnosis not present

## 2019-01-09 DIAGNOSIS — M797 Fibromyalgia: Secondary | ICD-10-CM | POA: Diagnosis not present

## 2019-01-09 DIAGNOSIS — J449 Chronic obstructive pulmonary disease, unspecified: Secondary | ICD-10-CM | POA: Diagnosis not present

## 2019-01-09 DIAGNOSIS — M47815 Spondylosis without myelopathy or radiculopathy, thoracolumbar region: Secondary | ICD-10-CM | POA: Diagnosis not present

## 2019-01-09 DIAGNOSIS — K219 Gastro-esophageal reflux disease without esophagitis: Secondary | ICD-10-CM | POA: Diagnosis not present

## 2019-01-09 DIAGNOSIS — Z7984 Long term (current) use of oral hypoglycemic drugs: Secondary | ICD-10-CM | POA: Diagnosis not present

## 2019-01-09 DIAGNOSIS — Z7951 Long term (current) use of inhaled steroids: Secondary | ICD-10-CM | POA: Diagnosis not present

## 2019-01-09 DIAGNOSIS — G894 Chronic pain syndrome: Secondary | ICD-10-CM | POA: Diagnosis not present

## 2019-01-09 DIAGNOSIS — M1990 Unspecified osteoarthritis, unspecified site: Secondary | ICD-10-CM | POA: Diagnosis not present

## 2019-01-09 DIAGNOSIS — E785 Hyperlipidemia, unspecified: Secondary | ICD-10-CM | POA: Diagnosis not present

## 2019-01-09 DIAGNOSIS — S72402K Unspecified fracture of lower end of left femur, subsequent encounter for closed fracture with nonunion: Secondary | ICD-10-CM | POA: Diagnosis not present

## 2019-01-09 DIAGNOSIS — B961 Klebsiella pneumoniae [K. pneumoniae] as the cause of diseases classified elsewhere: Secondary | ICD-10-CM | POA: Diagnosis not present

## 2019-01-09 DIAGNOSIS — E119 Type 2 diabetes mellitus without complications: Secondary | ICD-10-CM | POA: Diagnosis not present

## 2019-01-12 DIAGNOSIS — G894 Chronic pain syndrome: Secondary | ICD-10-CM | POA: Diagnosis not present

## 2019-01-12 DIAGNOSIS — N10 Acute tubulo-interstitial nephritis: Secondary | ICD-10-CM | POA: Diagnosis not present

## 2019-01-12 DIAGNOSIS — M47815 Spondylosis without myelopathy or radiculopathy, thoracolumbar region: Secondary | ICD-10-CM | POA: Diagnosis not present

## 2019-01-12 DIAGNOSIS — E119 Type 2 diabetes mellitus without complications: Secondary | ICD-10-CM | POA: Diagnosis not present

## 2019-01-12 DIAGNOSIS — Z7951 Long term (current) use of inhaled steroids: Secondary | ICD-10-CM | POA: Diagnosis not present

## 2019-01-12 DIAGNOSIS — M797 Fibromyalgia: Secondary | ICD-10-CM | POA: Diagnosis not present

## 2019-01-12 DIAGNOSIS — J449 Chronic obstructive pulmonary disease, unspecified: Secondary | ICD-10-CM | POA: Diagnosis not present

## 2019-01-12 DIAGNOSIS — D649 Anemia, unspecified: Secondary | ICD-10-CM | POA: Diagnosis not present

## 2019-01-12 DIAGNOSIS — Z7984 Long term (current) use of oral hypoglycemic drugs: Secondary | ICD-10-CM | POA: Diagnosis not present

## 2019-01-12 DIAGNOSIS — K219 Gastro-esophageal reflux disease without esophagitis: Secondary | ICD-10-CM | POA: Diagnosis not present

## 2019-01-12 DIAGNOSIS — Z981 Arthrodesis status: Secondary | ICD-10-CM | POA: Diagnosis not present

## 2019-01-12 DIAGNOSIS — M1990 Unspecified osteoarthritis, unspecified site: Secondary | ICD-10-CM | POA: Diagnosis not present

## 2019-01-12 DIAGNOSIS — I5030 Unspecified diastolic (congestive) heart failure: Secondary | ICD-10-CM | POA: Diagnosis not present

## 2019-01-12 DIAGNOSIS — S72402K Unspecified fracture of lower end of left femur, subsequent encounter for closed fracture with nonunion: Secondary | ICD-10-CM | POA: Diagnosis not present

## 2019-01-12 DIAGNOSIS — E785 Hyperlipidemia, unspecified: Secondary | ICD-10-CM | POA: Diagnosis not present

## 2019-01-12 DIAGNOSIS — B961 Klebsiella pneumoniae [K. pneumoniae] as the cause of diseases classified elsewhere: Secondary | ICD-10-CM | POA: Diagnosis not present

## 2019-01-12 DIAGNOSIS — I110 Hypertensive heart disease with heart failure: Secondary | ICD-10-CM | POA: Diagnosis not present

## 2019-01-13 DIAGNOSIS — J449 Chronic obstructive pulmonary disease, unspecified: Secondary | ICD-10-CM | POA: Diagnosis not present

## 2019-01-13 DIAGNOSIS — B961 Klebsiella pneumoniae [K. pneumoniae] as the cause of diseases classified elsewhere: Secondary | ICD-10-CM | POA: Diagnosis not present

## 2019-01-13 DIAGNOSIS — Z981 Arthrodesis status: Secondary | ICD-10-CM | POA: Diagnosis not present

## 2019-01-13 DIAGNOSIS — E785 Hyperlipidemia, unspecified: Secondary | ICD-10-CM | POA: Diagnosis not present

## 2019-01-13 DIAGNOSIS — D649 Anemia, unspecified: Secondary | ICD-10-CM | POA: Diagnosis not present

## 2019-01-13 DIAGNOSIS — G894 Chronic pain syndrome: Secondary | ICD-10-CM | POA: Diagnosis not present

## 2019-01-13 DIAGNOSIS — M1990 Unspecified osteoarthritis, unspecified site: Secondary | ICD-10-CM | POA: Diagnosis not present

## 2019-01-13 DIAGNOSIS — I5030 Unspecified diastolic (congestive) heart failure: Secondary | ICD-10-CM | POA: Diagnosis not present

## 2019-01-13 DIAGNOSIS — K219 Gastro-esophageal reflux disease without esophagitis: Secondary | ICD-10-CM | POA: Diagnosis not present

## 2019-01-13 DIAGNOSIS — I110 Hypertensive heart disease with heart failure: Secondary | ICD-10-CM | POA: Diagnosis not present

## 2019-01-13 DIAGNOSIS — M797 Fibromyalgia: Secondary | ICD-10-CM | POA: Diagnosis not present

## 2019-01-13 DIAGNOSIS — Z7951 Long term (current) use of inhaled steroids: Secondary | ICD-10-CM | POA: Diagnosis not present

## 2019-01-13 DIAGNOSIS — N10 Acute tubulo-interstitial nephritis: Secondary | ICD-10-CM | POA: Diagnosis not present

## 2019-01-13 DIAGNOSIS — Z7984 Long term (current) use of oral hypoglycemic drugs: Secondary | ICD-10-CM | POA: Diagnosis not present

## 2019-01-13 DIAGNOSIS — S72402K Unspecified fracture of lower end of left femur, subsequent encounter for closed fracture with nonunion: Secondary | ICD-10-CM | POA: Diagnosis not present

## 2019-01-13 DIAGNOSIS — M47815 Spondylosis without myelopathy or radiculopathy, thoracolumbar region: Secondary | ICD-10-CM | POA: Diagnosis not present

## 2019-01-13 DIAGNOSIS — E119 Type 2 diabetes mellitus without complications: Secondary | ICD-10-CM | POA: Diagnosis not present

## 2019-01-14 DIAGNOSIS — Z7951 Long term (current) use of inhaled steroids: Secondary | ICD-10-CM | POA: Diagnosis not present

## 2019-01-14 DIAGNOSIS — M47815 Spondylosis without myelopathy or radiculopathy, thoracolumbar region: Secondary | ICD-10-CM | POA: Diagnosis not present

## 2019-01-14 DIAGNOSIS — D649 Anemia, unspecified: Secondary | ICD-10-CM | POA: Diagnosis not present

## 2019-01-14 DIAGNOSIS — J449 Chronic obstructive pulmonary disease, unspecified: Secondary | ICD-10-CM | POA: Diagnosis not present

## 2019-01-14 DIAGNOSIS — Z7984 Long term (current) use of oral hypoglycemic drugs: Secondary | ICD-10-CM | POA: Diagnosis not present

## 2019-01-14 DIAGNOSIS — E119 Type 2 diabetes mellitus without complications: Secondary | ICD-10-CM | POA: Diagnosis not present

## 2019-01-14 DIAGNOSIS — N10 Acute tubulo-interstitial nephritis: Secondary | ICD-10-CM | POA: Diagnosis not present

## 2019-01-14 DIAGNOSIS — B961 Klebsiella pneumoniae [K. pneumoniae] as the cause of diseases classified elsewhere: Secondary | ICD-10-CM | POA: Diagnosis not present

## 2019-01-14 DIAGNOSIS — M797 Fibromyalgia: Secondary | ICD-10-CM | POA: Diagnosis not present

## 2019-01-14 DIAGNOSIS — I110 Hypertensive heart disease with heart failure: Secondary | ICD-10-CM | POA: Diagnosis not present

## 2019-01-14 DIAGNOSIS — G894 Chronic pain syndrome: Secondary | ICD-10-CM | POA: Diagnosis not present

## 2019-01-14 DIAGNOSIS — I5030 Unspecified diastolic (congestive) heart failure: Secondary | ICD-10-CM | POA: Diagnosis not present

## 2019-01-14 DIAGNOSIS — Z981 Arthrodesis status: Secondary | ICD-10-CM | POA: Diagnosis not present

## 2019-01-14 DIAGNOSIS — M1990 Unspecified osteoarthritis, unspecified site: Secondary | ICD-10-CM | POA: Diagnosis not present

## 2019-01-14 DIAGNOSIS — K219 Gastro-esophageal reflux disease without esophagitis: Secondary | ICD-10-CM | POA: Diagnosis not present

## 2019-01-14 DIAGNOSIS — S72402K Unspecified fracture of lower end of left femur, subsequent encounter for closed fracture with nonunion: Secondary | ICD-10-CM | POA: Diagnosis not present

## 2019-01-14 DIAGNOSIS — E785 Hyperlipidemia, unspecified: Secondary | ICD-10-CM | POA: Diagnosis not present

## 2019-01-15 DIAGNOSIS — M47815 Spondylosis without myelopathy or radiculopathy, thoracolumbar region: Secondary | ICD-10-CM | POA: Diagnosis not present

## 2019-01-15 DIAGNOSIS — N10 Acute tubulo-interstitial nephritis: Secondary | ICD-10-CM | POA: Diagnosis not present

## 2019-01-15 DIAGNOSIS — M797 Fibromyalgia: Secondary | ICD-10-CM | POA: Diagnosis not present

## 2019-01-15 DIAGNOSIS — D649 Anemia, unspecified: Secondary | ICD-10-CM | POA: Diagnosis not present

## 2019-01-15 DIAGNOSIS — G894 Chronic pain syndrome: Secondary | ICD-10-CM | POA: Diagnosis not present

## 2019-01-15 DIAGNOSIS — K219 Gastro-esophageal reflux disease without esophagitis: Secondary | ICD-10-CM | POA: Diagnosis not present

## 2019-01-15 DIAGNOSIS — I5030 Unspecified diastolic (congestive) heart failure: Secondary | ICD-10-CM | POA: Diagnosis not present

## 2019-01-15 DIAGNOSIS — S72402K Unspecified fracture of lower end of left femur, subsequent encounter for closed fracture with nonunion: Secondary | ICD-10-CM | POA: Diagnosis not present

## 2019-01-15 DIAGNOSIS — E119 Type 2 diabetes mellitus without complications: Secondary | ICD-10-CM | POA: Diagnosis not present

## 2019-01-15 DIAGNOSIS — Z7984 Long term (current) use of oral hypoglycemic drugs: Secondary | ICD-10-CM | POA: Diagnosis not present

## 2019-01-15 DIAGNOSIS — J449 Chronic obstructive pulmonary disease, unspecified: Secondary | ICD-10-CM | POA: Diagnosis not present

## 2019-01-15 DIAGNOSIS — B961 Klebsiella pneumoniae [K. pneumoniae] as the cause of diseases classified elsewhere: Secondary | ICD-10-CM | POA: Diagnosis not present

## 2019-01-15 DIAGNOSIS — E785 Hyperlipidemia, unspecified: Secondary | ICD-10-CM | POA: Diagnosis not present

## 2019-01-15 DIAGNOSIS — Z7951 Long term (current) use of inhaled steroids: Secondary | ICD-10-CM | POA: Diagnosis not present

## 2019-01-15 DIAGNOSIS — M1990 Unspecified osteoarthritis, unspecified site: Secondary | ICD-10-CM | POA: Diagnosis not present

## 2019-01-15 DIAGNOSIS — I110 Hypertensive heart disease with heart failure: Secondary | ICD-10-CM | POA: Diagnosis not present

## 2019-01-15 DIAGNOSIS — Z981 Arthrodesis status: Secondary | ICD-10-CM | POA: Diagnosis not present

## 2019-01-20 DIAGNOSIS — E785 Hyperlipidemia, unspecified: Secondary | ICD-10-CM | POA: Diagnosis not present

## 2019-01-20 DIAGNOSIS — Z7951 Long term (current) use of inhaled steroids: Secondary | ICD-10-CM | POA: Diagnosis not present

## 2019-01-20 DIAGNOSIS — Z7984 Long term (current) use of oral hypoglycemic drugs: Secondary | ICD-10-CM | POA: Diagnosis not present

## 2019-01-20 DIAGNOSIS — J449 Chronic obstructive pulmonary disease, unspecified: Secondary | ICD-10-CM | POA: Diagnosis not present

## 2019-01-20 DIAGNOSIS — E119 Type 2 diabetes mellitus without complications: Secondary | ICD-10-CM | POA: Diagnosis not present

## 2019-01-20 DIAGNOSIS — M1990 Unspecified osteoarthritis, unspecified site: Secondary | ICD-10-CM | POA: Diagnosis not present

## 2019-01-20 DIAGNOSIS — S72402K Unspecified fracture of lower end of left femur, subsequent encounter for closed fracture with nonunion: Secondary | ICD-10-CM | POA: Diagnosis not present

## 2019-01-20 DIAGNOSIS — I110 Hypertensive heart disease with heart failure: Secondary | ICD-10-CM | POA: Diagnosis not present

## 2019-01-20 DIAGNOSIS — I5030 Unspecified diastolic (congestive) heart failure: Secondary | ICD-10-CM | POA: Diagnosis not present

## 2019-01-20 DIAGNOSIS — G894 Chronic pain syndrome: Secondary | ICD-10-CM | POA: Diagnosis not present

## 2019-01-20 DIAGNOSIS — M797 Fibromyalgia: Secondary | ICD-10-CM | POA: Diagnosis not present

## 2019-01-20 DIAGNOSIS — Z981 Arthrodesis status: Secondary | ICD-10-CM | POA: Diagnosis not present

## 2019-01-20 DIAGNOSIS — M47815 Spondylosis without myelopathy or radiculopathy, thoracolumbar region: Secondary | ICD-10-CM | POA: Diagnosis not present

## 2019-01-20 DIAGNOSIS — D649 Anemia, unspecified: Secondary | ICD-10-CM | POA: Diagnosis not present

## 2019-01-20 DIAGNOSIS — B961 Klebsiella pneumoniae [K. pneumoniae] as the cause of diseases classified elsewhere: Secondary | ICD-10-CM | POA: Diagnosis not present

## 2019-01-20 DIAGNOSIS — K219 Gastro-esophageal reflux disease without esophagitis: Secondary | ICD-10-CM | POA: Diagnosis not present

## 2019-01-20 DIAGNOSIS — N10 Acute tubulo-interstitial nephritis: Secondary | ICD-10-CM | POA: Diagnosis not present

## 2019-01-23 ENCOUNTER — Telehealth: Payer: Self-pay | Admitting: *Deleted

## 2019-01-23 NOTE — Telephone Encounter (Signed)
Given need for social distancing. OKay to cancel RN visit if pt does not want to be seen.
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
## 2019-01-24 DIAGNOSIS — S72492S Other fracture of lower end of left femur, sequela: Secondary | ICD-10-CM | POA: Diagnosis not present

## 2019-01-24 DIAGNOSIS — S72142D Displaced intertrochanteric fracture of left femur, subsequent encounter for closed fracture with routine healing: Secondary | ICD-10-CM | POA: Diagnosis not present

## 2019-02-02 ENCOUNTER — Telehealth: Payer: Self-pay | Admitting: *Deleted
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
## 2019-02-02 NOTE — Telephone Encounter (Signed)
Noted.  They want a call back from me? Or can you call Butch Penny to get more info.

## 2019-02-02 NOTE — Telephone Encounter (Signed)
Janine PT with [facility] left a voicemail stating that they are discharging patient from their services because she is refusing home visit for physical therapy and home health. Janine requested a call back regarding this.

## 2019-02-03 NOTE — Telephone Encounter (Signed)
Noted  

## 2019-02-03 NOTE — Telephone Encounter (Signed)
Call pt. She is overdue for 6 month follow up DM. See if we can do webex( probably not) or phone/facetime call. Ideally, she should come by office for curbside labs. Not sure if she can.

## 2019-02-03 NOTE — Telephone Encounter (Signed)
Appointment made for April 2nd for phone visit. Patient does not have capabilities to do face time or webex. She is not walking and is not able to get in the car to come in for lab work at this time.

## 2019-02-05 ENCOUNTER — Other Ambulatory Visit: Payer: Self-pay

## 2019-02-05 ENCOUNTER — Ambulatory Visit (INDEPENDENT_AMBULATORY_CARE_PROVIDER_SITE_OTHER): Payer: Medicare Other | Admitting: Family Medicine

## 2019-02-05 ENCOUNTER — Encounter: Payer: Self-pay | Admitting: Family Medicine

## 2019-02-05 VITALS — Ht <= 58 in

## 2019-02-05 DIAGNOSIS — I10 Essential (primary) hypertension: Secondary | ICD-10-CM | POA: Diagnosis not present

## 2019-02-05 DIAGNOSIS — E785 Hyperlipidemia, unspecified: Secondary | ICD-10-CM

## 2019-02-05 DIAGNOSIS — F411 Generalized anxiety disorder: Secondary | ICD-10-CM

## 2019-02-05 DIAGNOSIS — R809 Proteinuria, unspecified: Secondary | ICD-10-CM | POA: Diagnosis not present

## 2019-02-05 DIAGNOSIS — F3341 Major depressive disorder, recurrent, in partial remission: Secondary | ICD-10-CM | POA: Diagnosis not present

## 2019-02-05 DIAGNOSIS — E119 Type 2 diabetes mellitus without complications: Secondary | ICD-10-CM

## 2019-02-05 DIAGNOSIS — Z8781 Personal history of (healed) traumatic fracture: Secondary | ICD-10-CM

## 2019-02-05 DIAGNOSIS — R29898 Other symptoms and signs involving the musculoskeletal system: Secondary | ICD-10-CM

## 2019-02-05 MED ORDER — ALPRAZOLAM 0.5 MG PO TABS: 0.5000 mg | ORAL_TABLET | Freq: Three times a day (TID) | ORAL | 0 refills | Status: DC

## 2019-02-05 NOTE — Assessment & Plan Note (Signed)
Well controlled. Continue current medication.  

## 2019-02-05 NOTE — Assessment & Plan Note (Signed)
Appears to be well controlled... better now with weight loss  At least since infection resolved.  Re-eval with curbside labs in 1 month.

## 2019-02-05 NOTE — Patient Instructions (Signed)
When able have yearly eye exam.

## 2019-02-05 NOTE — Progress Notes (Signed)
TELEPHONE VISIT  Due to national recommendations of social distancing due to [place] 19, Audio telehealth visit is felt to be most appropriate for this patient at this time.   Interactive audio and video telecommunications were attempted between this provider and patient, however failed, due to patient having technical difficulties OR patient did not have access to video capability.  We continued and completed visit with audio only.   I connected with Sandra Ferrell on 02/05/19 at  2:00 PM EDT by telephone and verified that I am speaking with the correct person using two identifiers.   I discussed the limitations, risks, security and privacy concerns of performing an evaluation and management service by telephone and the availability of in person appointments. I also discussed with the patient that there may be a patient responsible charge related to this service. The patient expressed understanding and agreed to proceed.  Patient location: Home Provider Location: [facility] Participants: Eliezer Lofts and Sandra Ferrell   History of Present Illness:   06/28/2018 ORIF L distal femur fx  08/20/2018 Returned for surgery to remove screw.  Ortho Dr. Grandville Silos.   Admitted 12/2018 for pyelonephritis and sepsis.Marland Kitchen resolved.  No UTI, good  Po intake.  She is awaiting orders for PT to proceed. She  Is able to weight bear , but left knee still painful. Using bone stimulator BID.   Able to get out of bed, she is not walking much even with walker.  She is doing home PT.  Had follow ORTHO 4/15.. but may be rescheduled.  Diabetes: metformin 1000 mg BID   Lab Results  Component Value Date   HGBA1C 8.0 (H) 12/25/2018    Eating low carb diet She has not been eating as much.. having Glucerna shakes She has lost about 50 lbs per pt  In last 3 months.  Using medications without difficulties: Hypoglycemic episodes:none Hyperglycemic episodes: none Feet problems: no uclers Blood Sugars  averaging: FBS 76, 119-126 2 hours after meals... in last month. Was high when had infection. eye exam within last year: due   She has quit smoking 06/2018.   MDD:  She is stable with her mood on amitriptyline. Using alprazolam TID. She is worried about decreasing alprazolam at this point... Anxious  Off and on.  HTN not able to check her BP but has been controlled of MD visits. BP Readings from Last 3 Encounters:  12/27/18 (!) 110/52  08/20/18 (!) 144/92  07/03/18 (!) 131/57    COVID 19 screen No recent travel or known exposure to [place] The patient denies respiratory symptoms of COVID 19 at this time.  The importance of social distancing was discussed today.   ROS    Past Medical History:  Diagnosis Date  . Allergic rhinitis, cause unspecified   . Anemia   . Anxiety state, unspecified   . Backache, unspecified   . Calculus of kidney   . Carpal tunnel syndrome, right   . CHF (congestive heart failure) ([place])    unspecified   . Chronic pain syndrome   . Constipation   . COPD (chronic obstructive pulmonary disease) ([place])   . Cough   . Depressive disorder, not elsewhere classified    managed with medications  . Difficulty in walking   . Displaced intertrochanteric fracture of left femur ([place])   . Esophageal reflux   . Extrinsic asthma with exacerbation   . Fibromyalgia   . Heart murmur   . Hyperlipidemia   . Hypertension   .  Muscle weakness (generalized)   . Nicotine dependence, cigarettes, uncomplicated   . Osteoarthrosis, unspecified whether generalized or localized, unspecified site   . Other abnormalities of gait and mobility   . Other and unspecified hyperlipidemia   . Other irritable bowel syndrome   . Other screening mammogram   . Personal history of unspecified urinary disorder   . Pneumonia   . Pressure ulcer of sacral region   . Routine general medical examination at a health care facility   . Routine gynecological examination   . Tobacco use disorder    . Type II or unspecified type diabetes mellitus without mention of complication, not stated as uncontrolled   . Unspecified fall, subsequent encounter   . Unspecified sleep apnea   . Urinary tract infection   . Vaginitis   . Wears glasses   . Wheezing     reports that she has been smoking cigarettes. She has a 35.00 pack-year smoking history. She has never used smokeless tobacco. She reports that she does not drink alcohol or use drugs.   Current Outpatient Medications:  .  ALPRAZolam (XANAX) 0.5 MG tablet, TAKE 1 TABLET BY MOUTH THREE TIMES DAILY, Disp: 90 tablet, Rfl: 0 .  amitriptyline (ELAVIL) 75 MG tablet, Take 1 tablet (75 mg total) by mouth at bedtime., Disp: 10 tablet, Rfl: 0 .  budesonide-formoterol (SYMBICORT) 160-4.5 MCG/ACT inhaler, INHALE 2 PUFFS INTO THE LUNGS TWICE DAILY, Disp: 10.2 g, Rfl: 2 .  dicyclomine (BENTYL) 20 MG tablet, Take 1 tablet (20 mg total) 2 (two) times daily by mouth., Disp: 20 tablet, Rfl: 0 .  dimenhyDRINATE (DRAMAMINE PO), Take 1 tablet by mouth daily as needed (nausea)., Disp: , Rfl:  .  feeding supplement, GLUCERNA SHAKE, (GLUCERNA SHAKE) LIQD, Take 237 mLs by mouth 2 (two) times daily between meals., Disp: 10 Can, Rfl: 0 .  ferrous sulfate 325 (65 FE) MG tablet, Take 325 mg by mouth daily with breakfast., Disp: , Rfl:  .  furosemide (LASIX) 20 MG tablet, TAKE 1 TABLET(20 MG) BY MOUTH DAILY (Patient taking differently: Take 20 mg by mouth daily. ), Disp: 90 tablet, Rfl: 0 .  gabapentin (NEURONTIN) 300 MG capsule, TAKE 1 CAPSULE BY MOUTH IN THE MORNING, 1 CAPSULE IN THE AFTERNOON AND 2 CAPSULES AT BEDTIME (Patient taking differently: Take 300-600 mg by mouth See admin instructions. 300 mg in the am, 300 mg in the afternoon and 600 mg at bedtime), Disp: 120 capsule, Rfl: 0 .  gemfibrozil (LOPID) 600 MG tablet, TAKE 1 TABLET BY MOUTH TWICE DAILY (Patient taking differently: Take 600 mg by mouth 2 (two) times daily before a meal. ), Disp: 60 tablet, Rfl: 5 .   Insulin Glargine (LANTUS SOLOSTAR) 100 UNIT/ML Solostar Pen, ADMINISTER 35 UNITS UNDER THE SKIN AT BEDTIME (Patient taking differently: Inject 30 Units into the skin at bedtime. ), Disp: 15 mL, Rfl: 5 .  Lancet Devices (ADJUSTABLE LANCING DEVICE) MISC, Check blood sugar twice a day and as directed. Dx E11.9, Disp: 100 each, Rfl: 5 .  loperamide (IMODIUM A-D) 2 MG tablet, Take 2 mg by mouth 4 (four) times daily as needed for diarrhea or loose stools., Disp: , Rfl:  .  metFORMIN (GLUCOPHAGE) 1000 MG tablet, TAKE 1 TABLET BY MOUTH TWICE DAILY WITH MEALS (Patient taking differently: Take 1,000 mg by mouth 2 (two) times daily with a meal. ), Disp: 180 tablet, Rfl: 1 .  methocarbamol (ROBAXIN) 500 MG tablet, Take 2 tablets (1,000 mg total) by mouth at bedtime. (Patient  taking differently: Take 1,000 mg by mouth at bedtime as needed for muscle spasms. ), Disp: 60 tablet, Rfl: 2 .  omeprazole (PRILOSEC OTC) 20 MG tablet, Take 40 mg by mouth daily., Disp: , Rfl:  .  OVER THE COUNTER MEDICATION, Apply 1 application topically daily as needed. Hempvana, Disp: , Rfl:  .  pioglitazone (ACTOS) 45 MG tablet, TAKE 1 TABLET BY MOUTH EVERY DAY, Disp: 90 tablet, Rfl: 0 .  PROAIR HFA 108 (90 Base) MCG/ACT inhaler, TAKE 2 PUFFS BY MOUTH EVERY 4 HOURS AS NEEDED FOR WHEEZE OR FOR SHORTNESS OF BREATH (Patient taking differently: Inhale 2 puffs into the lungs every 4 (four) hours as needed for wheezing or shortness of breath. ), Disp: 8.5 Inhaler, Rfl: 5 .  Vitamin D, Ergocalciferol, (DRISDOL) 1.25 MG (50000 UT) CAPS capsule, Take 1 capsule (50,000 Units total) by mouth every 7 (seven) days., Disp: 5 capsule, Rfl: 0 .  ACCU-CHEK AVIVA PLUS test strip, TEST BLOOD SUGAR TWICE DAILY AND DIRECTED, Disp: 100 each, Rfl: 11 .  ACCU-CHEK SOFTCLIX LANCETS lancets, CHECK BLOOD SUGAR TWICE DAILY AND AS DIRECTED, Disp: 100 each, Rfl: 11 .  AIMSCO INSULIN SYR ULTRA THIN 31G X 5/16" 0.3 ML MISC, , Disp: , Rfl:  .  Alcohol Swabs PADS, Check  blood sugar twice a day and as directed. Dx E11.9, Disp: 100 each, Rfl: 5 .  B-D ULTRAFINE III SHORT PEN 31G X 8 MM MISC, USE AS DIRECTED WITH INSULIN PEN, Disp: 100 each, Rfl: 11 .  Blood Glucose Monitoring Suppl (ACCU-CHEK AVIVA PLUS) w/Device KIT, Check blood sugar twice daily and as directed., Disp: 1 kit, Rfl: 0   Observations/Objective: Height 4' 9.5" (1.461 m).  Physical Exam   Physical Exam Constitutional:      General: She is not in acute distress. Pulmonary:     Effort: Pulmonary effort is normal. No respiratory distress.  Neurological:     Mental Status: She is alert and oriented to person, place, and time.  Psychiatric:        Mood and Affect: Mood normal.        Behavior: Behavior normal.  Assessment and Plan See problem based charting   I discussed the assessment and treatment plan with the patient. The patient was provided an opportunity to ask questions and all were answered. The patient agreed with the plan and demonstrated an understanding of the instructions.   The patient was advised to call back or seek an in-person evaluation if the symptoms worsen or if the condition fails to improve as anticipated.  I provided 30 minutes of non-face-to-face time during this encounter.   Eliezer Lofts, MD

## 2019-02-05 NOTE — Assessment & Plan Note (Signed)
Limited mobility. Encourage pt to do  increase PT and work on leg strengthening.

## 2019-02-05 NOTE — Assessment & Plan Note (Signed)
Stable control but unabel to decrease alprazolam at this time.  Refuses SSRI at this time.  refilled alprazolam.

## 2019-02-05 NOTE — Assessment & Plan Note (Signed)
Re-eval in 1 month. Low chool low carb diet. Increase activity as tolerated.

## 2019-02-06 ENCOUNTER — Telehealth: Payer: Self-pay | Admitting: Family Medicine

## 2019-02-06 NOTE — Telephone Encounter (Signed)
Dr [name] When do you want pt to do  She had phone visit 4/2     follow up DM telephone OV( no online capabilities) with curbside labs prior

## 2019-02-06 NOTE — Telephone Encounter (Signed)
In 4 weeks

## 2019-02-09 ENCOUNTER — Other Ambulatory Visit: Payer: Self-pay | Admitting: Family Medicine

## 2019-02-09 NOTE — Telephone Encounter (Signed)
Spoke with pt she will call back to schedule

## 2019-02-09 NOTE — Telephone Encounter (Signed)
Last office visit 02/05/2019 with phone visit.  Last refilled 10/27/2018 for #120 with no refills.  No future appointments.

## 2019-02-24 DIAGNOSIS — S72492S Other fracture of lower end of left femur, sequela: Secondary | ICD-10-CM | POA: Diagnosis not present

## 2019-02-24 DIAGNOSIS — S72142D Displaced intertrochanteric fracture of left femur, subsequent encounter for closed fracture with routine healing: Secondary | ICD-10-CM | POA: Diagnosis not present

## 2019-03-05 ENCOUNTER — Other Ambulatory Visit: Payer: Self-pay | Admitting: Family Medicine

## 2019-03-05 NOTE — Telephone Encounter (Signed)
Last visit 02/05/2019 for DM.  Last refilled 08/17/2018 for #60 with 2 refills.  No future appointments.

## 2019-03-09 ENCOUNTER — Ambulatory Visit: Payer: Medicare Other | Admitting: Family Medicine

## 2019-03-09 ENCOUNTER — Other Ambulatory Visit: Payer: Medicare Other

## 2019-03-10 ENCOUNTER — Telehealth: Payer: Self-pay | Admitting: Family Medicine

## 2019-03-10 ENCOUNTER — Encounter: Payer: Self-pay | Admitting: Family Medicine

## 2019-03-10 ENCOUNTER — Other Ambulatory Visit: Payer: Medicare Other

## 2019-03-10 ENCOUNTER — Ambulatory Visit (INDEPENDENT_AMBULATORY_CARE_PROVIDER_SITE_OTHER): Payer: Medicare Other | Admitting: Family Medicine

## 2019-03-10 DIAGNOSIS — Z8781 Personal history of (healed) traumatic fracture: Secondary | ICD-10-CM | POA: Diagnosis not present

## 2019-03-10 DIAGNOSIS — R32 Unspecified urinary incontinence: Secondary | ICD-10-CM | POA: Diagnosis not present

## 2019-03-10 DIAGNOSIS — R29898 Other symptoms and signs involving the musculoskeletal system: Secondary | ICD-10-CM

## 2019-03-10 DIAGNOSIS — R300 Dysuria: Secondary | ICD-10-CM | POA: Diagnosis not present

## 2019-03-10 MED ORDER — SULFAMETHOXAZOLE-TRIMETHOPRIM 800-160 MG PO TABS: 1.0000 | ORAL_TABLET | Freq: Two times a day (BID) | ORAL | 0 refills | Status: DC

## 2019-03-10 NOTE — Assessment & Plan Note (Signed)
Seems to be very protracted course following femur fracture and repair. Encouraged pt to follow up with ORTHO.  She has very low motivation to get out of bed, Husband is not as helpful and encouraging as would be ideal.

## 2019-03-10 NOTE — Progress Notes (Signed)
VIRTUAL VISIT Due to national recommendations of social distancing due to [place] 19, a virtual visit is felt to be most appropriate for this patient at this time.   Interactive audio and video telecommunications were attempted between this provider and patient, however failed, due to patient having technical difficulties OR patient did not have access to video capability.  We continued and completed visit with audio only.    I connected with the patient on 03/10/19 at  2:30 PM EDT by virtual telehealth platform and verified that I am speaking with the correct person using two identifiers.   I discussed the limitations, risks, security and privacy concerns of performing an evaluation and management service by  virtual telehealth platform and the availability of in person appointments. I also discussed with the patient that there may be a patient responsible charge related to this service. The patient expressed understanding and agreed to proceed.  Patient location: Home Provider Location: [place] Participants: Eliezer Lofts and [name]   Chief Complaint  Patient presents with  . Nausea  . Burning with Urination    History of Present Illness: 58 year old female with diabetes presents for new onset burning with urination and nausea x 1 week. She reports nausea, dysuria and mid back pain bilaterally. She has also noted increase in frequency. She is incontinent. No blood in urine.  She has been bedridden wearing depends ( she is being changed by husband for BMs and urination) since femur fracture.  She is not able to get to bathroom given leg pain. Admitted 12/2018 for pyelonephritis and sepsis.Marland Kitchen resolved. Culture : Klebsiella, resistant to ampicillin  PT not coming any longer. Per past report  From home health... they were refused entry, issues with patients husband...pt states that was not true.. they came at unscheduled times.  Has ORTHO appt on 03/19/2019  COVID 19  screen No recent travel or known exposure to [place] The patient denies respiratory symptoms of COVID 19 at this time.  The importance of social distancing was discussed today.   Review of Systems  Constitutional: Negative for chills and fever.  HENT: Negative for congestion and ear pain.   Eyes: Negative for pain and redness.  Respiratory: Negative for cough and shortness of breath.   Cardiovascular: Negative for chest pain, palpitations and leg swelling.  Gastrointestinal: Positive for nausea. Negative for abdominal pain, blood in stool, constipation, diarrhea and vomiting.  Genitourinary: Positive for dysuria, frequency and urgency. Negative for hematuria.  Musculoskeletal: Negative for falls and myalgias.  Skin: Negative for rash.  Neurological: Negative for dizziness.  Psychiatric/Behavioral: Negative for depression. The patient is not nervous/anxious.       Past Medical History:  Diagnosis Date  . Allergic rhinitis, cause unspecified   . Anemia   . Anxiety state, unspecified   . Backache, unspecified   . Calculus of kidney   . Carpal tunnel syndrome, right   . CHF (congestive heart failure) ([place])    unspecified   . Chronic pain syndrome   . Constipation   . COPD (chronic obstructive pulmonary disease) ([place])   . Cough   . Depressive disorder, not elsewhere classified    managed with medications  . Difficulty in walking   . Displaced intertrochanteric fracture of left femur ([place])   . Esophageal reflux   . Extrinsic asthma with exacerbation   . Fibromyalgia   . Heart murmur   . Hyperlipidemia   . Hypertension   . Muscle weakness (generalized)   .  Nicotine dependence, cigarettes, uncomplicated   . Osteoarthrosis, unspecified whether generalized or localized, unspecified site   . Other abnormalities of gait and mobility   . Other and unspecified hyperlipidemia   . Other irritable bowel syndrome   . Other screening mammogram   . Personal history of unspecified urinary  disorder   . Pneumonia   . Pressure ulcer of sacral region   . Routine general medical examination at a health care facility   . Routine gynecological examination   . Tobacco use disorder   . Type II or unspecified type diabetes mellitus without mention of complication, not stated as uncontrolled   . Unspecified fall, subsequent encounter   . Unspecified sleep apnea   . Urinary tract infection   . Vaginitis   . Wears glasses   . Wheezing     reports that she has quit smoking. Her smoking use included cigarettes. She has a 35.00 pack-year smoking history. She has never used smokeless tobacco. She reports that she does not drink alcohol or use drugs.   Current Outpatient Medications:  .  ACCU-CHEK AVIVA PLUS test strip, TEST BLOOD SUGAR TWICE DAILY AND DIRECTED, Disp: 100 each, Rfl: 11 .  ACCU-CHEK SOFTCLIX LANCETS lancets, CHECK BLOOD SUGAR TWICE DAILY AND AS DIRECTED, Disp: 100 each, Rfl: 11 .  AIMSCO INSULIN SYR ULTRA THIN 31G X 5/16" 0.3 ML MISC, , Disp: , Rfl:  .  Alcohol Swabs PADS, Check blood sugar twice a day and as directed. Dx E11.9, Disp: 100 each, Rfl: 5 .  ALPRAZolam (XANAX) 0.5 MG tablet, Take 1 tablet (0.5 mg total) by mouth 3 (three) times daily., Disp: 90 tablet, Rfl: 0 .  amitriptyline (ELAVIL) 75 MG tablet, Take 1 tablet (75 mg total) by mouth at bedtime., Disp: 10 tablet, Rfl: 0 .  B-D ULTRAFINE III SHORT PEN 31G X 8 MM MISC, USE AS DIRECTED WITH INSULIN PEN, Disp: 100 each, Rfl: 11 .  Blood Glucose Monitoring Suppl (ACCU-CHEK AVIVA PLUS) w/Device KIT, Check blood sugar twice daily and as directed., Disp: 1 kit, Rfl: 0 .  budesonide-formoterol (SYMBICORT) 160-4.5 MCG/ACT inhaler, INHALE 2 PUFFS INTO THE LUNGS TWICE DAILY, Disp: 10.2 g, Rfl: 2 .  dicyclomine (BENTYL) 20 MG tablet, Take 1 tablet (20 mg total) 2 (two) times daily by mouth., Disp: 20 tablet, Rfl: 0 .  dimenhyDRINATE (DRAMAMINE PO), Take 1 tablet by mouth daily as needed (nausea)., Disp: , Rfl:  .  feeding  supplement, GLUCERNA SHAKE, (GLUCERNA SHAKE) LIQD, Take 237 mLs by mouth 2 (two) times daily between meals., Disp: 10 Can, Rfl: 0 .  ferrous sulfate 325 (65 FE) MG tablet, Take 325 mg by mouth daily with breakfast., Disp: , Rfl:  .  furosemide (LASIX) 20 MG tablet, TAKE 1 TABLET(20 MG) BY MOUTH DAILY (Patient taking differently: Take 20 mg by mouth daily. ), Disp: 90 tablet, Rfl: 0 .  gabapentin (NEURONTIN) 300 MG capsule, TAKE ONE CAPSULE BY MOUTH IN THE MORNING, 1 CAPSULE IN THE AFTERNOON, AND 2 CAPSULES AT BEDTIME, Disp: 120 capsule, Rfl: 0 .  gemfibrozil (LOPID) 600 MG tablet, TAKE 1 TABLET BY MOUTH TWICE DAILY (Patient taking differently: Take 600 mg by mouth 2 (two) times daily before a meal. ), Disp: 60 tablet, Rfl: 5 .  Insulin Glargine (LANTUS SOLOSTAR) 100 UNIT/ML Solostar Pen, ADMINISTER 35 UNITS UNDER THE SKIN AT BEDTIME (Patient taking differently: Inject 30 Units into the skin at bedtime. ), Disp: 15 mL, Rfl: 5 .  Lancet Devices (ADJUSTABLE LANCING DEVICE)  MISC, Check blood sugar twice a day and as directed. Dx E11.9, Disp: 100 each, Rfl: 5 .  loperamide (IMODIUM A-D) 2 MG tablet, Take 2 mg by mouth 4 (four) times daily as needed for diarrhea or loose stools., Disp: , Rfl:  .  metFORMIN (GLUCOPHAGE) 1000 MG tablet, TAKE 1 TABLET BY MOUTH TWICE DAILY WITH MEALS (Patient taking differently: Take 1,000 mg by mouth 2 (two) times daily with a meal. ), Disp: 180 tablet, Rfl: 1 .  methocarbamol (ROBAXIN) 500 MG tablet, Take 2 tablets (1,000 mg total) by mouth at bedtime as needed for muscle spasms., Disp: 60 tablet, Rfl: 2 .  omeprazole (PRILOSEC OTC) 20 MG tablet, Take 40 mg by mouth daily., Disp: , Rfl:  .  OVER THE COUNTER MEDICATION, Apply 1 application topically daily as needed. Hempvana, Disp: , Rfl:  .  pioglitazone (ACTOS) 45 MG tablet, TAKE 1 TABLET BY MOUTH EVERY DAY, Disp: 90 tablet, Rfl: 0 .  PROAIR HFA 108 (90 Base) MCG/ACT inhaler, TAKE 2 PUFFS BY MOUTH EVERY 4 HOURS AS NEEDED FOR  WHEEZE OR FOR SHORTNESS OF BREATH (Patient taking differently: Inhale 2 puffs into the lungs every 4 (four) hours as needed for wheezing or shortness of breath. ), Disp: 8.5 Inhaler, Rfl: 5 .  Vitamin D, Ergocalciferol, (DRISDOL) 1.25 MG (50000 UT) CAPS capsule, Take 1 capsule (50,000 Units total) by mouth every 7 (seven) days., Disp: 5 capsule, Rfl: 0   Observations/Objective: There were no vitals taken for this visit.  Physical Exam  Physical Exam Constitutional:      General: The patient is not in acute distress. Pulmonary:     Effort: Pulmonary effort is normal. No respiratory distress.  Neurological:     Mental Status: The patient is alert and oriented to person, place, and time.  Psychiatric:        Mood and Affect: Mood normal.        Behavior: Behavior normal.   Assessment and Plan Dysuria Likely GUR:KYHCW empirically on antibiotics.  Increase water.  Work on getting up out of bed for BMs and urination as this is likely the reason she has had 2 UTIs in last 3 months.  Pt will get urine sample with hat prior to starting antibiotics tonight. Husband will return sample tomorrow AM.   History of femur fracture Seems to be very protracted course following femur fracture and repair. Encouraged pt to follow up with ORTHO.  She has very low motivation to get out of bed, Husband is not as helpful and encouraging as would be ideal.  Bilateral leg weakness She has very low motivation to get out of bed, Husband is not as helpful and encouraging as would be ideal.  She does not seem to want to get up out of bed to use restroom, does not mind using adult diaper and having husband change her multiple times a day ( does not clearly seem to be a functional issue but instead a motivational issue) Encouraged pt to try PT again...will refer again to Home health. Pt would likely do better overall at a rehab facility.     I discussed the assessment and treatment plan with the patient. The  patient was provided an opportunity to ask questions and all were answered. The patient agreed with the plan and demonstrated an understanding of the instructions.   The patient was advised to call back or seek an in-person evaluation if the symptoms worsen or if the condition fails to improve as anticipated.  Laken Rog, MD   

## 2019-03-10 NOTE — Assessment & Plan Note (Signed)
Likely ITG:PQDIY empirically on antibiotics.  Increase water.  Work on getting up out of bed for BMs and urination as this is likely the reason she has had 2 UTIs in last 3 months.  Pt will get urine sample with hat prior to starting antibiotics tonight. Husband will return sample tomorrow AM.

## 2019-03-10 NOTE — Patient Instructions (Signed)
Start empirically on antibiotics.  Increase water.  Work on getting up out of bed for BMs and urination.. will resend PT referral.  Pt will get urine sample with hat prior to starting antibiotics tonight. Husband will return sample tomorrow AM.

## 2019-03-10 NOTE — Telephone Encounter (Signed)
Will discuss with pt at virtual visit today.

## 2019-03-10 NOTE — Telephone Encounter (Signed)
Patient stated that her husband will not help her collect a Urine specimen.. Patient is bed ridden and wearing diapers so she is unable to collect this by herself.   She has a virtual visit with Dr [name] today at 2:30 and would like to know what she should do.  Patient asked that a message be left on her Voicemail and that we do not speak with the husband.

## 2019-03-10 NOTE — Assessment & Plan Note (Signed)
She has very low motivation to get out of bed, Husband is not as helpful and encouraging as would be ideal.  She does not seem to want to get up out of bed to use restroom, does not mind using adult diaper and having husband change her multiple times a day ( does not clearly seem to be a functional issue but instead a motivational issue) Encouraged pt to try PT again...will refer again to Home health. Pt would likely do better overall at a rehab facility.

## 2019-03-11 ENCOUNTER — Telehealth: Payer: Self-pay | Admitting: Family Medicine

## 2019-03-11 ENCOUNTER — Other Ambulatory Visit: Payer: Medicare Other

## 2019-03-11 ENCOUNTER — Other Ambulatory Visit: Payer: Self-pay | Admitting: Family Medicine

## 2019-03-11 ENCOUNTER — Other Ambulatory Visit: Payer: Self-pay

## 2019-03-11 NOTE — Telephone Encounter (Signed)
Spoke w/pt's husband.  Notified him that [facility] will be contacting them to set up home health care.  He states he will bring pt's urine specimen today.

## 2019-03-11 NOTE — Telephone Encounter (Signed)
Last visit 03/10/2019 for UTI.  Last refilled 02/09/2019 for #120 with no refills.  No future appointments.

## 2019-03-11 NOTE — Telephone Encounter (Signed)
Patient called and said she can't do the urine sample.  She's too weak to stand up.  Patient said home health nurse is coming to her home tomorrow and she said maybe they could do it.

## 2019-03-12 ENCOUNTER — Telehealth: Payer: Self-pay

## 2019-03-12 DIAGNOSIS — Z8781 Personal history of (healed) traumatic fracture: Secondary | ICD-10-CM

## 2019-03-12 DIAGNOSIS — R29898 Other symptoms and signs involving the musculoskeletal system: Secondary | ICD-10-CM

## 2019-03-12 DIAGNOSIS — R300 Dysuria: Secondary | ICD-10-CM

## 2019-03-12 DIAGNOSIS — R32 Unspecified urinary incontinence: Secondary | ICD-10-CM

## 2019-03-12 NOTE — Telephone Encounter (Signed)
Per [place]'s discussion with [facility] this am:  Patient's husband spoke with Rudean Haskell (home health nurse with [facility] at 1035am today).  Husband refused services with [facility] and Ronny Bacon has made Candace (Engineer, technical sales aware).    They have officially discharged patient from care and will not be picking her back up again.   I spoke earlier with patient and husband expressing our desire to get patient seen as soon as possible for her care and needs.  I also let them know that [facility] has accepted her insurance and will be coming out today to see her.  Per H H nurse report they spoke with husband yesterday and arranged the visit for today.    Patient states [facility] never contacted them and this was the problem they had with them before.  She and husband do not want to have services again with [facility] due to their lack of communication and difficulty coordinating visits.  Husband changed his mind twice on the phone regarding what he wanted for her care.  Patient deferred to husbands desire.  He states that he wants [facility] to come back in.  Wife states they both had a very pleasant experience with [facility] and would like to see if they will accept their insurance again.    Per Dr. Rometta Emery initial orders, I will re-submit the home health referral to [facility] as Charmaine Hall,CMA has already discussed this case with them and they are able and willing to pick patient up and accept her insurance at this time.  Patient has been made aware that it may take up to 48hours before the care team can get out there to patient.  She and husband both verbalize understanding and are aware of the risks and chose to move forward with setting up with [facility].

## 2019-03-12 NOTE — Telephone Encounter (Signed)
Patient's husband called demanding to know where the [facility] nurse was.  Stating that they have been waiting on her to come all day.  He is worried about his wife and states we are not giving her appropriate care.   I explained to husband that he (against my recommendation) cancelled [facility] nurse Ronny Bacon, RN) this am when she called to inform them she was on her way then to the home.  He, also, (with wife's agreement) stated that they wanted [facility] instead despite the fact that I told them it would be at least 24-48hours before they could get out there to the home.    (see previous message string regarding their insistence to change agencies to [facility]).    Hiram Gash at [facility] states that after this incident, they will not take the [place] back under care.    I reminded Mr. Lacher of these conversations.  He continually cut me off on the phone, would not listen, called me a liar and accused [place] of not acting in his wife's best interest.  I did everything he and his wife directed me to do today with changing care services.  I confirmed all of this with his wife on the phone today at 1134am.  It was at her approval that the [facility] referral was submitted.    I asked Mr. Mooers to please have his wife come to the phone.  He refused stating she was probably asleep.  I had to interrupt him as he was not listening to me and state that at this time there was nothing further he and I could discuss and that he should discuss with his wife and have her call me back if she has any concerns.   He continued to berate and accuse me and our office of lying and not taking care of his wife.  I was unsuccessful in redirecting our conversation.  In mid-sentence I had to interject, I  apologized that that was his perception, asked for his wife to please call if concerns and had to hang up the phone on him.    At this time we are still moving forward with  [facility] as his wife requested.  However, he threatened to cancel their services "just to show [place] something".  I have put in the referral to please only speak with the wife, but if [facility] speaks directly with him it is very likely he may cancel services again.  He is nonsensical and belligerent on the phone with me today, and is preventing his wife from receiving necessary care and treatment.    FYI to Dr. [name].

## 2019-03-12 NOTE — Telephone Encounter (Signed)
Contacted patient and spoke directly with her making her aware that [facility] states that husband cancelled services when nurse called out earlier this am to set up visit time.   Also, informed patient that [facility] will accept patient and referral has been resubmitted.  It will take 24-48hours to process the care request but to expect someone to call from [facility] to set up care services.  Patient verbalized agreement and is very happy with plan and thanks [place] for our help.   FYI to Dr. [name].

## 2019-03-12 NOTE — Telephone Encounter (Signed)
Noted  

## 2019-03-13 DIAGNOSIS — G894 Chronic pain syndrome: Secondary | ICD-10-CM | POA: Diagnosis not present

## 2019-03-13 DIAGNOSIS — J449 Chronic obstructive pulmonary disease, unspecified: Secondary | ICD-10-CM | POA: Diagnosis not present

## 2019-03-13 DIAGNOSIS — M1990 Unspecified osteoarthritis, unspecified site: Secondary | ICD-10-CM | POA: Diagnosis not present

## 2019-03-13 DIAGNOSIS — N12 Tubulo-interstitial nephritis, not specified as acute or chronic: Secondary | ICD-10-CM | POA: Diagnosis not present

## 2019-03-13 DIAGNOSIS — I5032 Chronic diastolic (congestive) heart failure: Secondary | ICD-10-CM | POA: Diagnosis not present

## 2019-03-13 DIAGNOSIS — K219 Gastro-esophageal reflux disease without esophagitis: Secondary | ICD-10-CM | POA: Diagnosis not present

## 2019-03-13 DIAGNOSIS — I110 Hypertensive heart disease with heart failure: Secondary | ICD-10-CM | POA: Diagnosis not present

## 2019-03-13 DIAGNOSIS — D649 Anemia, unspecified: Secondary | ICD-10-CM | POA: Diagnosis not present

## 2019-03-13 DIAGNOSIS — E119 Type 2 diabetes mellitus without complications: Secondary | ICD-10-CM | POA: Diagnosis not present

## 2019-03-13 DIAGNOSIS — B961 Klebsiella pneumoniae [K. pneumoniae] as the cause of diseases classified elsewhere: Secondary | ICD-10-CM | POA: Diagnosis not present

## 2019-03-13 NOTE — Telephone Encounter (Signed)
Noted. Will consider adult protective services consult. Will let patient know that no future interaction with husband and our office will be done given his behavior.

## 2019-03-14 DIAGNOSIS — I110 Hypertensive heart disease with heart failure: Secondary | ICD-10-CM | POA: Diagnosis not present

## 2019-03-14 DIAGNOSIS — J449 Chronic obstructive pulmonary disease, unspecified: Secondary | ICD-10-CM | POA: Diagnosis not present

## 2019-03-14 DIAGNOSIS — E119 Type 2 diabetes mellitus without complications: Secondary | ICD-10-CM | POA: Diagnosis not present

## 2019-03-14 DIAGNOSIS — M1990 Unspecified osteoarthritis, unspecified site: Secondary | ICD-10-CM | POA: Diagnosis not present

## 2019-03-14 DIAGNOSIS — K219 Gastro-esophageal reflux disease without esophagitis: Secondary | ICD-10-CM | POA: Diagnosis not present

## 2019-03-14 DIAGNOSIS — D649 Anemia, unspecified: Secondary | ICD-10-CM | POA: Diagnosis not present

## 2019-03-14 DIAGNOSIS — N12 Tubulo-interstitial nephritis, not specified as acute or chronic: Secondary | ICD-10-CM | POA: Diagnosis not present

## 2019-03-14 DIAGNOSIS — B961 Klebsiella pneumoniae [K. pneumoniae] as the cause of diseases classified elsewhere: Secondary | ICD-10-CM | POA: Diagnosis not present

## 2019-03-14 DIAGNOSIS — G894 Chronic pain syndrome: Secondary | ICD-10-CM | POA: Diagnosis not present

## 2019-03-14 DIAGNOSIS — I5032 Chronic diastolic (congestive) heart failure: Secondary | ICD-10-CM | POA: Diagnosis not present

## 2019-03-16 ENCOUNTER — Telehealth: Payer: Self-pay | Admitting: Family Medicine

## 2019-03-16 NOTE — Telephone Encounter (Signed)
Verbal orders give to Katie from [facility] for skilled nursing, PT, OT and Man as requested.  Joellen Jersey is wanting to know if Dr. [name] would like for her to get a in and out cath urine sample at some point.  Linnae started her antibiotics on 03/10/19 and Joellen Jersey saw her on 05//06/2019.  Edwyna was still having symptoms but was afebrile.  Please advise.

## 2019-03-16 NOTE — Telephone Encounter (Signed)
Yes if pt still with symptoms.Marland Kitchen obtain in and out cath... send for UA , micro and culture

## 2019-03-16 NOTE — Telephone Encounter (Signed)
Sandra Ferrell @ [facility] number [phone]  Called to get pt /ot / skilled nursing order  Skilled nursing 1 week 1  2 week 2 1 week 3 3 prns  Pt  I week 1 2 week 3 1 week 1  ot to come out to eval  Home health aid 2 week 3

## 2019-03-16 NOTE — Telephone Encounter (Signed)
Left message for Sandra Ferrell with [facility] that if pt still with symptoms.Marland Kitchen obtain in and out cath... send for UA , micro and culture per Dr. [name].

## 2019-03-17 ENCOUNTER — Other Ambulatory Visit: Payer: Self-pay | Admitting: Family Medicine

## 2019-03-17 DIAGNOSIS — B961 Klebsiella pneumoniae [K. pneumoniae] as the cause of diseases classified elsewhere: Secondary | ICD-10-CM | POA: Diagnosis not present

## 2019-03-17 DIAGNOSIS — N12 Tubulo-interstitial nephritis, not specified as acute or chronic: Secondary | ICD-10-CM | POA: Diagnosis not present

## 2019-03-17 DIAGNOSIS — G894 Chronic pain syndrome: Secondary | ICD-10-CM | POA: Diagnosis not present

## 2019-03-17 DIAGNOSIS — D649 Anemia, unspecified: Secondary | ICD-10-CM | POA: Diagnosis not present

## 2019-03-17 DIAGNOSIS — K219 Gastro-esophageal reflux disease without esophagitis: Secondary | ICD-10-CM | POA: Diagnosis not present

## 2019-03-17 DIAGNOSIS — I5032 Chronic diastolic (congestive) heart failure: Secondary | ICD-10-CM | POA: Diagnosis not present

## 2019-03-17 DIAGNOSIS — M1990 Unspecified osteoarthritis, unspecified site: Secondary | ICD-10-CM | POA: Diagnosis not present

## 2019-03-17 DIAGNOSIS — I110 Hypertensive heart disease with heart failure: Secondary | ICD-10-CM | POA: Diagnosis not present

## 2019-03-17 DIAGNOSIS — E119 Type 2 diabetes mellitus without complications: Secondary | ICD-10-CM | POA: Diagnosis not present

## 2019-03-17 DIAGNOSIS — J449 Chronic obstructive pulmonary disease, unspecified: Secondary | ICD-10-CM | POA: Diagnosis not present

## 2019-03-18 DIAGNOSIS — B961 Klebsiella pneumoniae [K. pneumoniae] as the cause of diseases classified elsewhere: Secondary | ICD-10-CM | POA: Diagnosis not present

## 2019-03-18 DIAGNOSIS — I110 Hypertensive heart disease with heart failure: Secondary | ICD-10-CM | POA: Diagnosis not present

## 2019-03-18 DIAGNOSIS — I5032 Chronic diastolic (congestive) heart failure: Secondary | ICD-10-CM | POA: Diagnosis not present

## 2019-03-18 DIAGNOSIS — J449 Chronic obstructive pulmonary disease, unspecified: Secondary | ICD-10-CM | POA: Diagnosis not present

## 2019-03-18 DIAGNOSIS — N12 Tubulo-interstitial nephritis, not specified as acute or chronic: Secondary | ICD-10-CM | POA: Diagnosis not present

## 2019-03-18 DIAGNOSIS — D649 Anemia, unspecified: Secondary | ICD-10-CM | POA: Diagnosis not present

## 2019-03-18 DIAGNOSIS — E119 Type 2 diabetes mellitus without complications: Secondary | ICD-10-CM | POA: Diagnosis not present

## 2019-03-18 DIAGNOSIS — G894 Chronic pain syndrome: Secondary | ICD-10-CM | POA: Diagnosis not present

## 2019-03-18 DIAGNOSIS — M1990 Unspecified osteoarthritis, unspecified site: Secondary | ICD-10-CM | POA: Diagnosis not present

## 2019-03-18 DIAGNOSIS — K219 Gastro-esophageal reflux disease without esophagitis: Secondary | ICD-10-CM | POA: Diagnosis not present

## 2019-03-18 NOTE — Telephone Encounter (Signed)
Last visit 03/10/2019 for dysuria.  Last refilled 02/05/2019 for #90 with no refills.  No future appointments.

## 2019-03-19 DIAGNOSIS — S72492D Other fracture of lower end of left femur, subsequent encounter for closed fracture with routine healing: Secondary | ICD-10-CM | POA: Diagnosis not present

## 2019-03-20 DIAGNOSIS — Z87442 Personal history of urinary calculi: Secondary | ICD-10-CM | POA: Diagnosis not present

## 2019-03-20 DIAGNOSIS — N12 Tubulo-interstitial nephritis, not specified as acute or chronic: Secondary | ICD-10-CM | POA: Diagnosis not present

## 2019-03-20 DIAGNOSIS — M1990 Unspecified osteoarthritis, unspecified site: Secondary | ICD-10-CM | POA: Diagnosis not present

## 2019-03-20 DIAGNOSIS — I110 Hypertensive heart disease with heart failure: Secondary | ICD-10-CM | POA: Diagnosis not present

## 2019-03-20 DIAGNOSIS — B961 Klebsiella pneumoniae [K. pneumoniae] as the cause of diseases classified elsewhere: Secondary | ICD-10-CM | POA: Diagnosis not present

## 2019-03-20 DIAGNOSIS — J449 Chronic obstructive pulmonary disease, unspecified: Secondary | ICD-10-CM | POA: Diagnosis not present

## 2019-03-20 DIAGNOSIS — I5032 Chronic diastolic (congestive) heart failure: Secondary | ICD-10-CM | POA: Diagnosis not present

## 2019-03-20 DIAGNOSIS — E119 Type 2 diabetes mellitus without complications: Secondary | ICD-10-CM | POA: Diagnosis not present

## 2019-03-20 DIAGNOSIS — G894 Chronic pain syndrome: Secondary | ICD-10-CM | POA: Diagnosis not present

## 2019-03-20 DIAGNOSIS — D649 Anemia, unspecified: Secondary | ICD-10-CM | POA: Diagnosis not present

## 2019-03-20 DIAGNOSIS — E1165 Type 2 diabetes mellitus with hyperglycemia: Secondary | ICD-10-CM | POA: Diagnosis not present

## 2019-03-20 DIAGNOSIS — N390 Urinary tract infection, site not specified: Secondary | ICD-10-CM | POA: Diagnosis not present

## 2019-03-20 DIAGNOSIS — Z7689 Persons encountering health services in other specified circumstances: Secondary | ICD-10-CM | POA: Diagnosis not present

## 2019-03-20 DIAGNOSIS — K219 Gastro-esophageal reflux disease without esophagitis: Secondary | ICD-10-CM | POA: Diagnosis not present

## 2019-03-23 ENCOUNTER — Other Ambulatory Visit: Payer: Self-pay | Admitting: Family Medicine

## 2019-03-24 ENCOUNTER — Other Ambulatory Visit: Payer: Self-pay | Admitting: Orthopedic Surgery

## 2019-03-24 ENCOUNTER — Telehealth: Payer: Self-pay

## 2019-03-24 DIAGNOSIS — N390 Urinary tract infection, site not specified: Secondary | ICD-10-CM | POA: Diagnosis not present

## 2019-03-24 DIAGNOSIS — S72492D Other fracture of lower end of left femur, subsequent encounter for closed fracture with routine healing: Secondary | ICD-10-CM

## 2019-03-24 DIAGNOSIS — J449 Chronic obstructive pulmonary disease, unspecified: Secondary | ICD-10-CM | POA: Diagnosis not present

## 2019-03-24 DIAGNOSIS — I5032 Chronic diastolic (congestive) heart failure: Secondary | ICD-10-CM | POA: Diagnosis not present

## 2019-03-24 DIAGNOSIS — I110 Hypertensive heart disease with heart failure: Secondary | ICD-10-CM | POA: Diagnosis not present

## 2019-03-24 DIAGNOSIS — K219 Gastro-esophageal reflux disease without esophagitis: Secondary | ICD-10-CM | POA: Diagnosis not present

## 2019-03-24 DIAGNOSIS — M79605 Pain in left leg: Secondary | ICD-10-CM

## 2019-03-24 DIAGNOSIS — D649 Anemia, unspecified: Secondary | ICD-10-CM | POA: Diagnosis not present

## 2019-03-24 DIAGNOSIS — N12 Tubulo-interstitial nephritis, not specified as acute or chronic: Secondary | ICD-10-CM | POA: Diagnosis not present

## 2019-03-24 DIAGNOSIS — B961 Klebsiella pneumoniae [K. pneumoniae] as the cause of diseases classified elsewhere: Secondary | ICD-10-CM | POA: Diagnosis not present

## 2019-03-24 DIAGNOSIS — S72325S Nondisplaced transverse fracture of shaft of left femur, sequela: Secondary | ICD-10-CM | POA: Diagnosis not present

## 2019-03-24 DIAGNOSIS — G894 Chronic pain syndrome: Secondary | ICD-10-CM | POA: Diagnosis not present

## 2019-03-24 DIAGNOSIS — M1990 Unspecified osteoarthritis, unspecified site: Secondary | ICD-10-CM | POA: Diagnosis not present

## 2019-03-24 DIAGNOSIS — E119 Type 2 diabetes mellitus without complications: Secondary | ICD-10-CM | POA: Diagnosis not present

## 2019-03-24 DIAGNOSIS — R739 Hyperglycemia, unspecified: Secondary | ICD-10-CM | POA: Diagnosis not present

## 2019-03-24 NOTE — Telephone Encounter (Signed)
[place] with [facility] saw pt earlier today; at 63 noon BS was 490. Pt was asymtomatic; pt has not been taking metformin,lantus or lopid and not cking BS daily. [place] advised pt to take metformin while she was there. Pt  Was advised to ck FBS and BS prior to taking lantus. And for pt to take her meds correctly. Pt voiced understanding. Pt also has redness in groin due to incontinence and pt was advised to use barrier cream to red area on groin. [place] needs order for barrier cream. [place] request cb. I spoke with pt and she has been drinking water; pt feels sleepy; pt checked BS while I was on phone, BS now is 459; pt said that she will take her meds as instructed. I spoke with Dr [name] and she said pt should take lantus 30 units into skin now and not wait until hs to take lantus.I made sure pt knew to take insulin now and not to take again at hs. Before hs if BS is > 400 call EMS and go to ED. If any N&V or abd pain call EMS and go to ED. Pt is to call [facility] in AM with update on FBS. Pt voiced understanding and will follow instructions. FYI to Dr [name]. Pt said leg is not healing and already has talked with surgeon and has appt for CT of leg at [facility] imaging.pt may have to go to a specialist that takes care of bones that do not heal. Pt wanted Dr [name] to be aware. pts husband also wants report on lab that [place] did last wk. And would like report when calls back tomorrow about FBS. FYI to Dr [name].

## 2019-03-25 ENCOUNTER — Encounter (HOSPITAL_COMMUNITY): Payer: Self-pay

## 2019-03-25 ENCOUNTER — Inpatient Hospital Stay (HOSPITAL_COMMUNITY): Payer: Medicare Other

## 2019-03-25 ENCOUNTER — Other Ambulatory Visit: Payer: Self-pay

## 2019-03-25 ENCOUNTER — Inpatient Hospital Stay (HOSPITAL_COMMUNITY)
Admission: EM | Admit: 2019-03-25 | Discharge: 2019-03-27 | DRG: 689 | Disposition: A | Discharge status: Home / Self Care | Payer: Medicare Other | Attending: Internal Medicine | Admitting: Internal Medicine

## 2019-03-25 DIAGNOSIS — I5032 Chronic diastolic (congestive) heart failure: Secondary | ICD-10-CM | POA: Diagnosis present

## 2019-03-25 DIAGNOSIS — Z6839 Body mass index (BMI) 39.0-39.9, adult: Secondary | ICD-10-CM | POA: Diagnosis not present

## 2019-03-25 DIAGNOSIS — Z87440 Personal history of urinary (tract) infections: Secondary | ICD-10-CM

## 2019-03-25 DIAGNOSIS — E785 Hyperlipidemia, unspecified: Secondary | ICD-10-CM | POA: Diagnosis not present

## 2019-03-25 DIAGNOSIS — S72325S Nondisplaced transverse fracture of shaft of left femur, sequela: Secondary | ICD-10-CM | POA: Diagnosis not present

## 2019-03-25 DIAGNOSIS — Z87891 Personal history of nicotine dependence: Secondary | ICD-10-CM | POA: Diagnosis not present

## 2019-03-25 DIAGNOSIS — Z794 Long term (current) use of insulin: Secondary | ICD-10-CM

## 2019-03-25 DIAGNOSIS — E876 Hypokalemia: Secondary | ICD-10-CM | POA: Diagnosis not present

## 2019-03-25 DIAGNOSIS — E860 Dehydration: Secondary | ICD-10-CM | POA: Diagnosis not present

## 2019-03-25 DIAGNOSIS — G894 Chronic pain syndrome: Secondary | ICD-10-CM | POA: Diagnosis present

## 2019-03-25 DIAGNOSIS — E1169 Type 2 diabetes mellitus with other specified complication: Secondary | ICD-10-CM | POA: Diagnosis present

## 2019-03-25 DIAGNOSIS — E871 Hypo-osmolality and hyponatremia: Secondary | ICD-10-CM | POA: Diagnosis present

## 2019-03-25 DIAGNOSIS — Z9049 Acquired absence of other specified parts of digestive tract: Secondary | ICD-10-CM

## 2019-03-25 DIAGNOSIS — N390 Urinary tract infection, site not specified: Principal | ICD-10-CM | POA: Diagnosis present

## 2019-03-25 DIAGNOSIS — Z87442 Personal history of urinary calculi: Secondary | ICD-10-CM | POA: Diagnosis not present

## 2019-03-25 DIAGNOSIS — F411 Generalized anxiety disorder: Secondary | ICD-10-CM | POA: Diagnosis present

## 2019-03-25 DIAGNOSIS — I110 Hypertensive heart disease with heart failure: Secondary | ICD-10-CM | POA: Diagnosis present

## 2019-03-25 DIAGNOSIS — Z7951 Long term (current) use of inhaled steroids: Secondary | ICD-10-CM

## 2019-03-25 DIAGNOSIS — R590 Localized enlarged lymph nodes: Secondary | ICD-10-CM | POA: Diagnosis not present

## 2019-03-25 DIAGNOSIS — J449 Chronic obstructive pulmonary disease, unspecified: Secondary | ICD-10-CM | POA: Diagnosis not present

## 2019-03-25 DIAGNOSIS — S72142D Displaced intertrochanteric fracture of left femur, subsequent encounter for closed fracture with routine healing: Secondary | ICD-10-CM | POA: Diagnosis not present

## 2019-03-25 DIAGNOSIS — Z1159 Encounter for screening for other viral diseases: Secondary | ICD-10-CM

## 2019-03-25 DIAGNOSIS — Z8781 Personal history of (healed) traumatic fracture: Secondary | ICD-10-CM

## 2019-03-25 DIAGNOSIS — S79929A Unspecified injury of unspecified thigh, initial encounter: Secondary | ICD-10-CM | POA: Diagnosis not present

## 2019-03-25 DIAGNOSIS — G9341 Metabolic encephalopathy: Secondary | ICD-10-CM | POA: Diagnosis not present

## 2019-03-25 DIAGNOSIS — Z823 Family history of stroke: Secondary | ICD-10-CM | POA: Diagnosis not present

## 2019-03-25 DIAGNOSIS — I10 Essential (primary) hypertension: Secondary | ICD-10-CM | POA: Diagnosis not present

## 2019-03-25 DIAGNOSIS — I959 Hypotension, unspecified: Secondary | ICD-10-CM | POA: Diagnosis not present

## 2019-03-25 DIAGNOSIS — S72492S Other fracture of lower end of left femur, sequela: Secondary | ICD-10-CM | POA: Diagnosis not present

## 2019-03-25 DIAGNOSIS — Z7401 Bed confinement status: Secondary | ICD-10-CM | POA: Diagnosis not present

## 2019-03-25 DIAGNOSIS — E1165 Type 2 diabetes mellitus with hyperglycemia: Secondary | ICD-10-CM | POA: Diagnosis present

## 2019-03-25 DIAGNOSIS — Z791 Long term (current) use of non-steroidal anti-inflammatories (NSAID): Secondary | ICD-10-CM

## 2019-03-25 DIAGNOSIS — S72452K Displaced supracondylar fracture without intracondylar extension of lower end of left femur, subsequent encounter for closed fracture with nonunion: Secondary | ICD-10-CM | POA: Diagnosis not present

## 2019-03-25 DIAGNOSIS — M797 Fibromyalgia: Secondary | ICD-10-CM | POA: Diagnosis present

## 2019-03-25 DIAGNOSIS — Z800 Family history of malignant neoplasm of digestive organs: Secondary | ICD-10-CM

## 2019-03-25 DIAGNOSIS — Z7984 Long term (current) use of oral hypoglycemic drugs: Secondary | ICD-10-CM

## 2019-03-25 DIAGNOSIS — Z9119 Patient's noncompliance with other medical treatment and regimen: Secondary | ICD-10-CM

## 2019-03-25 DIAGNOSIS — T7691XA Unspecified adult maltreatment, suspected, initial encounter: Secondary | ICD-10-CM

## 2019-03-25 DIAGNOSIS — R739 Hyperglycemia, unspecified: Secondary | ICD-10-CM | POA: Diagnosis present

## 2019-03-25 DIAGNOSIS — S72402K Unspecified fracture of lower end of left femur, subsequent encounter for closed fracture with nonunion: Secondary | ICD-10-CM | POA: Diagnosis not present

## 2019-03-25 DIAGNOSIS — M24569 Contracture, unspecified knee: Secondary | ICD-10-CM | POA: Diagnosis present

## 2019-03-25 DIAGNOSIS — W19XXXA Unspecified fall, initial encounter: Secondary | ICD-10-CM | POA: Diagnosis not present

## 2019-03-25 DIAGNOSIS — E1142 Type 2 diabetes mellitus with diabetic polyneuropathy: Secondary | ICD-10-CM | POA: Diagnosis not present

## 2019-03-25 DIAGNOSIS — Z658 Other specified problems related to psychosocial circumstances: Secondary | ICD-10-CM

## 2019-03-25 DIAGNOSIS — E559 Vitamin D deficiency, unspecified: Secondary | ICD-10-CM | POA: Diagnosis not present

## 2019-03-25 DIAGNOSIS — R29898 Other symptoms and signs involving the musculoskeletal system: Secondary | ICD-10-CM | POA: Diagnosis not present

## 2019-03-25 DIAGNOSIS — E119 Type 2 diabetes mellitus without complications: Secondary | ICD-10-CM | POA: Diagnosis not present

## 2019-03-25 DIAGNOSIS — Z79899 Other long term (current) drug therapy: Secondary | ICD-10-CM

## 2019-03-25 DIAGNOSIS — K219 Gastro-esophageal reflux disease without esophagitis: Secondary | ICD-10-CM | POA: Diagnosis present

## 2019-03-25 DIAGNOSIS — M2550 Pain in unspecified joint: Secondary | ICD-10-CM | POA: Diagnosis not present

## 2019-03-25 DIAGNOSIS — Z993 Dependence on wheelchair: Secondary | ICD-10-CM

## 2019-03-25 LAB — CBC WITH DIFFERENTIAL/PLATELET
Abs Immature Granulocytes: 0.04 10*3/uL (ref 0.00–0.07)
Basophils Absolute: 0 10*3/uL (ref 0.0–0.1)
Basophils Relative: 0 %
Eosinophils Absolute: 0.1 10*3/uL (ref 0.0–0.5)
Eosinophils Relative: 1 %
HCT: 36.5 % (ref 36.0–46.0)
Hemoglobin: 11.7 g/dL — ABNORMAL LOW (ref 12.0–15.0)
Immature Granulocytes: 1 %
Lymphocytes Relative: 21 %
Lymphs Abs: 1.6 10*3/uL (ref 0.7–4.0)
MCH: 27.7 pg (ref 26.0–34.0)
MCHC: 32.1 g/dL (ref 30.0–36.0)
MCV: 86.5 fL (ref 80.0–100.0)
Monocytes Absolute: 0.6 10*3/uL (ref 0.1–1.0)
Monocytes Relative: 8 %
Neutro Abs: 5.4 10*3/uL (ref 1.7–7.7)
Neutrophils Relative %: 69 %
Platelets: 316 10*3/uL (ref 150–400)
RBC: 4.22 MIL/uL (ref 3.87–5.11)
RDW: 13.6 % (ref 11.5–15.5)
WBC: 7.8 10*3/uL (ref 4.0–10.5)
nRBC: 0 % (ref 0.0–0.2)

## 2019-03-25 LAB — GLUCOSE, CAPILLARY
Glucose-Capillary: 215 mg/dL — ABNORMAL HIGH (ref 70–99)
Glucose-Capillary: 276 mg/dL — ABNORMAL HIGH (ref 70–99)
Glucose-Capillary: 323 mg/dL — ABNORMAL HIGH (ref 70–99)

## 2019-03-25 LAB — BASIC METABOLIC PANEL
Anion gap: 10 (ref 5–15)
BUN: 11 mg/dL (ref 6–20)
CO2: 23 mmol/L (ref 22–32)
Calcium: 8.2 mg/dL — ABNORMAL LOW (ref 8.9–10.3)
Chloride: 99 mmol/L (ref 98–111)
Creatinine, Ser: 0.65 mg/dL (ref 0.44–1.00)
GFR calc Af Amer: >60 mL/min (ref >60)
GFR calc non Af Amer: >60 mL/min (ref >60)
Glucose, Bld: 455 mg/dL — ABNORMAL HIGH (ref 70–99)
Potassium: 3.3 mmol/L — ABNORMAL LOW (ref 3.5–5.1)
Sodium: 132 mmol/L — ABNORMAL LOW (ref 135–145)

## 2019-03-25 LAB — URINALYSIS, ROUTINE W REFLEX MICROSCOPIC
Bacteria, UA: NONE SEEN
Bilirubin Urine: NEGATIVE
Glucose, UA: >=500 mg/dL — AB
Ketones, ur: NEGATIVE mg/dL
Nitrite: NEGATIVE
Protein, ur: NEGATIVE mg/dL
Specific Gravity, Urine: 1.029 (ref 1.005–1.030)
WBC, UA: >50 WBC/hpf — ABNORMAL HIGH (ref 0–5)
pH: 6 (ref 5.0–8.0)

## 2019-03-25 LAB — HEMOGLOBIN A1C
Hgb A1c MFr Bld: 13.7 % — ABNORMAL HIGH (ref 4.8–5.6)
Mean Plasma Glucose: 346.49 mg/dL

## 2019-03-25 LAB — TSH: TSH: 1.627 u[IU]/mL (ref 0.350–4.500)

## 2019-03-25 LAB — CBG MONITORING, ED
Glucose-Capillary: 428 mg/dL — ABNORMAL HIGH (ref 70–99)
Glucose-Capillary: 317 mg/dL — ABNORMAL HIGH (ref 70–99)

## 2019-03-25 LAB — SARS CORONAVIRUS 2 BY RT PCR (HOSPITAL ORDER, PERFORMED IN ~~LOC~~ HOSPITAL LAB): SARS Coronavirus 2: NEGATIVE

## 2019-03-25 MED ORDER — AMITRIPTYLINE HCL 25 MG PO TABS
75.0000 mg | ORAL_TABLET | Freq: Every day | ORAL | Status: DC
  Administered 2019-03-25 – 2019-03-26 (×2): 75 mg via ORAL
  Filled 2019-03-25 (×2): qty 3

## 2019-03-25 MED ORDER — INSULIN GLARGINE 100 UNIT/ML ~~LOC~~ SOLN
30.0000 [IU] | Freq: Every day | SUBCUTANEOUS | Status: DC
  Administered 2019-03-25: 30 [IU] via SUBCUTANEOUS
  Filled 2019-03-25: qty 0.3

## 2019-03-25 MED ORDER — SODIUM CHLORIDE 0.9 % IV BOLUS
1000.0000 mL | Freq: Once | INTRAVENOUS | Status: AC
  Administered 2019-03-25: 1000 mL via INTRAVENOUS

## 2019-03-25 MED ORDER — MOMETASONE FURO-FORMOTEROL FUM 200-5 MCG/ACT IN AERO
2.0000 | INHALATION_SPRAY | Freq: Two times a day (BID) | RESPIRATORY_TRACT | Status: DC
  Administered 2019-03-25 – 2019-03-27 (×5): 2 via RESPIRATORY_TRACT
  Filled 2019-03-25: qty 8.8

## 2019-03-25 MED ORDER — METHOCARBAMOL 500 MG PO TABS: 500.0000 mg | ORAL_TABLET | Freq: Every evening | ORAL | Status: DC | PRN

## 2019-03-25 MED ORDER — ACETAMINOPHEN-CODEINE #3 300-30 MG PO TABS
1.0000 | ORAL_TABLET | Freq: Three times a day (TID) | ORAL | Status: DC | PRN
  Administered 2019-03-25 (×2): 1 via ORAL
  Filled 2019-03-25 (×2): qty 1

## 2019-03-25 MED ORDER — ALBUTEROL SULFATE (2.5 MG/3ML) 0.083% IN NEBU: 2.5000 mg | INHALATION_SOLUTION | RESPIRATORY_TRACT | Status: DC | PRN

## 2019-03-25 MED ORDER — POTASSIUM CHLORIDE CRYS ER 20 MEQ PO TBCR
40.0000 meq | EXTENDED_RELEASE_TABLET | Freq: Once | ORAL | Status: AC
  Administered 2019-03-25: 40 meq via ORAL
  Filled 2019-03-25: qty 2

## 2019-03-25 MED ORDER — ALPRAZOLAM 0.5 MG PO TABS
0.5000 mg | ORAL_TABLET | Freq: Three times a day (TID) | ORAL | Status: DC | PRN
  Administered 2019-03-25 – 2019-03-27 (×6): 0.5 mg via ORAL
  Filled 2019-03-25 (×6): qty 1

## 2019-03-25 MED ORDER — INSULIN ASPART 100 UNIT/ML ~~LOC~~ SOLN
10.0000 [IU] | Freq: Once | SUBCUTANEOUS | Status: AC
  Administered 2019-03-25: 10 [IU] via SUBCUTANEOUS
  Filled 2019-03-25: qty 1

## 2019-03-25 MED ORDER — DICYCLOMINE HCL 20 MG PO TABS
20.0000 mg | ORAL_TABLET | Freq: Two times a day (BID) | ORAL | Status: DC
  Administered 2019-03-25 – 2019-03-27 (×5): 20 mg via ORAL
  Filled 2019-03-25 (×5): qty 1

## 2019-03-25 MED ORDER — GABAPENTIN 300 MG PO CAPS
300.0000 mg | ORAL_CAPSULE | Freq: Two times a day (BID) | ORAL | Status: DC
  Administered 2019-03-25 – 2019-03-27 (×5): 300 mg via ORAL
  Filled 2019-03-25 (×5): qty 1

## 2019-03-25 MED ORDER — GABAPENTIN 300 MG PO CAPS
600.0000 mg | ORAL_CAPSULE | Freq: Every day | ORAL | Status: DC
  Administered 2019-03-25 – 2019-03-26 (×2): 600 mg via ORAL
  Filled 2019-03-25 (×2): qty 2

## 2019-03-25 MED ORDER — SODIUM CHLORIDE 0.9 % IV SOLN
1.0000 g | INTRAVENOUS | Status: DC
  Administered 2019-03-26 – 2019-03-27 (×2): 1 g via INTRAVENOUS
  Filled 2019-03-25 (×2): qty 1

## 2019-03-25 MED ORDER — INSULIN ASPART 100 UNIT/ML ~~LOC~~ SOLN
0.0000 [IU] | Freq: Three times a day (TID) | SUBCUTANEOUS | Status: DC
  Administered 2019-03-25: 8 [IU] via SUBCUTANEOUS
  Administered 2019-03-25 – 2019-03-26 (×2): 5 [IU] via SUBCUTANEOUS
  Administered 2019-03-26: 11 [IU] via SUBCUTANEOUS
  Administered 2019-03-26 – 2019-03-27 (×3): 5 [IU] via SUBCUTANEOUS
  Administered 2019-03-27: 12:00:00 3 [IU] via SUBCUTANEOUS

## 2019-03-25 MED ORDER — ENOXAPARIN SODIUM 40 MG/0.4ML ~~LOC~~ SOLN
40.0000 mg | SUBCUTANEOUS | Status: DC
  Administered 2019-03-25 – 2019-03-27 (×3): 40 mg via SUBCUTANEOUS
  Filled 2019-03-25 (×3): qty 0.4

## 2019-03-25 MED ORDER — INSULIN ASPART 100 UNIT/ML ~~LOC~~ SOLN
0.0000 [IU] | Freq: Every day | SUBCUTANEOUS | Status: DC
  Administered 2019-03-25: 4 [IU] via SUBCUTANEOUS
  Administered 2019-03-26: 22:00:00 2 [IU] via SUBCUTANEOUS

## 2019-03-25 MED ORDER — FERROUS SULFATE 325 (65 FE) MG PO TABS
325.0000 mg | ORAL_TABLET | Freq: Every day | ORAL | Status: DC
  Administered 2019-03-26 – 2019-03-27 (×2): 325 mg via ORAL
  Filled 2019-03-25 (×2): qty 1

## 2019-03-25 MED ORDER — GLUCERNA SHAKE PO LIQD
237.0000 mL | Freq: Two times a day (BID) | ORAL | Status: DC | PRN
  Filled 2019-03-25: qty 237

## 2019-03-25 MED ORDER — ONDANSETRON HCL 4 MG PO TABS: 4.0000 mg | ORAL_TABLET | Freq: Four times a day (QID) | ORAL | Status: DC | PRN

## 2019-03-25 MED ORDER — ONDANSETRON HCL 4 MG/2ML IJ SOLN: 4.0000 mg | Freq: Four times a day (QID) | INTRAMUSCULAR | Status: DC | PRN

## 2019-03-25 MED ORDER — ACETAMINOPHEN 325 MG PO TABS: 650.0000 mg | ORAL_TABLET | Freq: Four times a day (QID) | ORAL | Status: DC | PRN

## 2019-03-25 MED ORDER — OMEPRAZOLE MAGNESIUM 20 MG PO TBEC: 40.0000 mg | DELAYED_RELEASE_TABLET | Freq: Every day | ORAL | Status: DC

## 2019-03-25 MED ORDER — OMEPRAZOLE 20 MG PO CPDR
40.0000 mg | DELAYED_RELEASE_CAPSULE | Freq: Every day | ORAL | Status: DC
  Administered 2019-03-25 – 2019-03-27 (×3): 40 mg via ORAL
  Filled 2019-03-25 (×3): qty 2

## 2019-03-25 MED ORDER — SENNOSIDES-DOCUSATE SODIUM 8.6-50 MG PO TABS: 1.0000 | ORAL_TABLET | Freq: Every evening | ORAL | Status: DC | PRN

## 2019-03-25 MED ORDER — FUROSEMIDE 20 MG PO TABS
20.0000 mg | ORAL_TABLET | Freq: Every day | ORAL | Status: DC
  Administered 2019-03-25: 20 mg via ORAL
  Filled 2019-03-25: qty 1

## 2019-03-25 MED ORDER — POTASSIUM CHLORIDE IN NACL 20-0.9 MEQ/L-% IV SOLN
INTRAVENOUS | Status: AC
  Administered 2019-03-25: 13:00:00 via INTRAVENOUS
  Filled 2019-03-25 (×4): qty 1000

## 2019-03-25 MED ORDER — ACETAMINOPHEN 650 MG RE SUPP: 650.0000 mg | Freq: Four times a day (QID) | RECTAL | Status: DC | PRN

## 2019-03-25 MED ORDER — GEMFIBROZIL 600 MG PO TABS
600.0000 mg | ORAL_TABLET | Freq: Two times a day (BID) | ORAL | Status: DC
  Administered 2019-03-25 – 2019-03-27 (×4): 600 mg via ORAL
  Filled 2019-03-25 (×5): qty 1

## 2019-03-25 MED ORDER — ACETAMINOPHEN-CODEINE #3 300-30 MG PO TABS
1.0000 | ORAL_TABLET | Freq: Once | ORAL | Status: AC
  Administered 2019-03-25: 1 via ORAL
  Filled 2019-03-25: qty 1

## 2019-03-25 MED ORDER — SODIUM CHLORIDE 0.9 % IV SOLN
1.0000 g | Freq: Once | INTRAVENOUS | Status: AC
  Administered 2019-03-25: 1 g via INTRAVENOUS
  Filled 2019-03-25: qty 10

## 2019-03-25 NOTE — Telephone Encounter (Signed)
Agree pt need to go to ER  For persistantly high blood sugars.

## 2019-03-25 NOTE — Progress Notes (Signed)
Attempted to get report at 0910. Will wait for a call back for report

## 2019-03-25 NOTE — ED Triage Notes (Signed)
Per EMS - Called for high blood sugar. CBG Has not been regularly checked for 1 month. Not taken meds because pt stated husband is withholding. CBG 599, now reading high.   150/72 HR 96 R 20 95% RA  97.7
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
## 2019-03-25 NOTE — ED Notes (Addendum)
Pt requesting pain medication. [place], MD aware

## 2019-03-25 NOTE — ED Notes (Signed)
Report given to 5E 

## 2019-03-25 NOTE — Progress Notes (Signed)
OT Cancellation Note  Patient Details Name: Sandra Ferrell MRN: [id number] DOB: [birth year]   Cancelled Treatment:    Reason Eval/Treat Not Completed: Patient at procedure or test/ unavailable  Deisha Stull 03/25/2019, 2:47 PM  Lesle Chris, OTR/L Acute Rehabilitation Services [phone] [facility] pager [phone] office 03/25/2019
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
## 2019-03-25 NOTE — ED Notes (Signed)
[facility] at bedside
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
## 2019-03-25 NOTE — ED Notes (Addendum)
Per Francia Greaves, Md patient may eat or drink. Patient given diet cola.
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
## 2019-03-25 NOTE — Telephone Encounter (Signed)
[facility] Patient Name: Sandra Ferrell Gender: Unknown DOB: May 09, 1961 Age: 58 Y 10 M 12 D Return Phone Number: [phone] (Primary), [phone] (Secondary) Address: City/State/Zip: [place] [address] Client  [facility] - Client Client Site [place] Physician Eliezer Lofts - MD Contact Type Call Who Is Calling Patient / Member / Family / Caregiver Call Type Triage / Clinical Relationship To Patient Self Return Phone Number [phone] (Primary) Chief Complaint BLOOD SUGAR HIGH - 500 or greater Reason for Call Symptomatic / Request for Health Information Initial Comment Caller's blood sugar is 599. She was advised by dr. earlier to go to ER if does not come down and the EMT is there now. Translation No Nurse Assessment Nurse: Wynetta Emery, RN, Merleen Nicely Date/Time (Eastern Time): 03/25/2019 3:22:39 AM Confirm and document reason for call. If symptomatic, describe symptoms. ---Caller states blood sugar is 599. earlier today it was 48 something. took meds but hasn't come down. Has the patient had close contact with a person known or suspected to have the novel coronavirus illness OR traveled / lives in area with major community spread (including international travel) in the last 14 days from the onset of symptoms? * If Asymptomatic, screen for exposure and travel within the last 14 days. ---No Does the patient have any new or worsening symptoms? ---Yes Will a triage be completed? ---Yes Related visit to physician within the last 2 weeks? ---Yes Does the PT have any chronic conditions? (i.e. diabetes, asthma, this includes High risk factors for pregnancy, etc.) ---Yes List chronic conditions. ---diabetes Is this a behavioral health or substance abuse call? ---No Guidelines Guideline Title Affirmed Question Affirmed Notes  Nurse Date/Time (Eastern Time) Diabetes - High Blood Sugar Blood glucose > 500 mg/ dL (27.8 mmol/L) Wynetta Emery, RN, Merleen Nicely 03/25/2019 3:25:18 AM Disp. Time Eilene Ghazi Time) Disposition Final User 03/25/2019 3:21:22 AM Send to Urgent Ashok Pall 03/25/2019 3:28:27 AM Go to ED Now (or PCP triage) Yes Wynetta Emery, RN, Merleen Nicely PLEASE NOTE: All timestamps contained within this report are represented as [place] Standard Time. CONFIDENTIALTY NOTICE: This fax transmission is intended only for the addressee. It contains information that is legally privileged, confidential or otherwise protected from use or disclosure. If you are not the intended recipient, you are strictly prohibited from reviewing, disclosing, copying using or disseminating any of this information or taking any action in reliance on or regarding this information. If you have received this fax in error, please notify us immediately by telephone so that we can arrange for its return to [place]. Phone: [phone], Toll-Free: [phone], Fax: [phone] Page: 2 of 2 Call Id: [id number] [place] Disagree/Comply Comply Caller Understands Yes PreDisposition Did not know what to do Care Advice Given Per Guideline GO TO ED NOW (OR PCP TRIAGE): CARE ADVICE given per Diabetes - High Blood Sugar (Adult) guideline. BRING MEDICINES: * Please bring a list of your current medicines when you go to see the doctor. * It is also a good idea to bring the pill bottles too. This will help the doctor to make certain you are taking the right medicines and the right dose. Referrals GO TO FACILITY UNDECIDED
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
## 2019-03-26 LAB — GLUCOSE, CAPILLARY
Glucose-Capillary: 213 mg/dL — ABNORMAL HIGH (ref 70–99)
Glucose-Capillary: 340 mg/dL — ABNORMAL HIGH (ref 70–99)
Glucose-Capillary: 204 mg/dL — ABNORMAL HIGH (ref 70–99)
Glucose-Capillary: 236 mg/dL — ABNORMAL HIGH (ref 70–99)

## 2019-03-26 LAB — CBC
HCT: 33.7 % — ABNORMAL LOW (ref 36.0–46.0)
Hemoglobin: 10.5 g/dL — ABNORMAL LOW (ref 12.0–15.0)
MCH: 27.4 pg (ref 26.0–34.0)
MCHC: 31.2 g/dL (ref 30.0–36.0)
MCV: 88 fL (ref 80.0–100.0)
Platelets: 304 10*3/uL (ref 150–400)
RBC: 3.83 MIL/uL — ABNORMAL LOW (ref 3.87–5.11)
RDW: 13.7 % (ref 11.5–15.5)
WBC: 7.1 10*3/uL (ref 4.0–10.5)
nRBC: 0 % (ref 0.0–0.2)

## 2019-03-26 LAB — BASIC METABOLIC PANEL
Anion gap: 5 (ref 5–15)
BUN: 8 mg/dL (ref 6–20)
CO2: 23 mmol/L (ref 22–32)
Calcium: 7.9 mg/dL — ABNORMAL LOW (ref 8.9–10.3)
Chloride: 107 mmol/L (ref 98–111)
Creatinine, Ser: 0.58 mg/dL (ref 0.44–1.00)
GFR calc Af Amer: >60 mL/min (ref >60)
GFR calc non Af Amer: >60 mL/min (ref >60)
Glucose, Bld: 260 mg/dL — ABNORMAL HIGH (ref 70–99)
Potassium: 3.9 mmol/L (ref 3.5–5.1)
Sodium: 135 mmol/L (ref 135–145)

## 2019-03-26 LAB — MAGNESIUM: Magnesium: 1.4 mg/dL — ABNORMAL LOW (ref 1.7–2.4)

## 2019-03-26 MED ORDER — INSULIN GLARGINE 100 UNIT/ML ~~LOC~~ SOLN
35.0000 [IU] | Freq: Every day | SUBCUTANEOUS | Status: DC
  Administered 2019-03-26: 35 [IU] via SUBCUTANEOUS
  Filled 2019-03-26 (×2): qty 0.35

## 2019-03-26 MED ORDER — INSULIN STARTER KIT- PEN NEEDLES (ENGLISH)
1.0000 | Freq: Once | Status: DC
  Filled 2019-03-26: qty 1

## 2019-03-26 MED ORDER — LIVING WELL WITH DIABETES BOOK
Freq: Once | Status: DC
  Filled 2019-03-26: qty 1

## 2019-03-26 MED ORDER — MAGNESIUM SULFATE 2 GM/50ML IV SOLN
2.0000 g | Freq: Once | INTRAVENOUS | Status: AC
  Administered 2019-03-26: 2 g via INTRAVENOUS
  Filled 2019-03-26: qty 50

## 2019-03-26 MED ORDER — ACETAMINOPHEN-CODEINE #3 300-30 MG PO TABS
1.0000 | ORAL_TABLET | Freq: Four times a day (QID) | ORAL | Status: DC | PRN
  Administered 2019-03-26 – 2019-03-27 (×5): 1 via ORAL
  Filled 2019-03-26 (×5): qty 1
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
## 2019-03-26 NOTE — Progress Notes (Signed)
Inpatient Diabetes Program Recommendations  AACE/ADA: New Consensus Statement on Inpatient Glycemic Control (2015)  Target Ranges:  Prepandial:   less than 140 mg/dL      Peak postprandial:   less than 180 mg/dL (1-2 hours)      Critically ill patients:  140 - 180 mg/dL   Lab Results  Component Value Date   GLUCAP 213 (H) 03/26/2019   HGBA1C 13.7 (H) 03/25/2019    Review of Glycemic Control  Diabetes history: DM2 Outpatient Diabetes medications: Lantus 30 + Met 1 gm bid + Actos 45 qd Current orders for Inpatient glycemic control: Lantus 35 units + Novolog moderate correction tid + hs 0-5 units  Inpatient Diabetes Program Recommendations:   Spoke with pt about A1C results 13.7 (average blood glucose 346 over the past 2-3 months) and explained what an A1C is, basic pathophysiology of DM Type 2, basic home care, basic diabetes diet nutrition principles, importance of checking CBGs and maintaining good CBG control to prevent long-term and short-term complications. Reviewed signs and symptoms of hyperglycemia and hypoglycemia and how to treat hypoglycemia at home. Also reviewed blood sugar goals at home.  RNs to provide ongoing basic DM education at bedside with this patient. Have ordered educational booklet and DM videos.  Patient states she stopped taking her medications including her insulin for ? Weeks, but understands she needs to restart taking. Discussed need for glycemic control for increased healing and decreased risks.  Thank you,  E. , RN, MSN, CDE  Diabetes Coordinator Inpatient Glycemic Control Team Team Pager #[phone] (8am-5pm) 03/26/2019 11:05 AM    
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
## 2019-03-26 NOTE — Progress Notes (Signed)
Events of this hospitalization noted.  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  I have conferred with Dr. Lennette Bihari Haddix about this complicated reconstructive problem, and he has agreed to consult on this patient.  He will either see her while still here at [facility] or arrange outpatient f/u consultation.
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
## 2019-03-27 LAB — GLUCOSE, CAPILLARY
Glucose-Capillary: 213 mg/dL — ABNORMAL HIGH (ref 70–99)
Glucose-Capillary: 190 mg/dL — ABNORMAL HIGH (ref 70–99)
Glucose-Capillary: 225 mg/dL — ABNORMAL HIGH (ref 70–99)

## 2019-03-27 LAB — PREALBUMIN: Prealbumin: 12.2 mg/dL — ABNORMAL LOW (ref 18–38)

## 2019-03-27 LAB — MAGNESIUM: Magnesium: 1.5 mg/dL — ABNORMAL LOW (ref 1.7–2.4)

## 2019-03-27 LAB — PHOSPHORUS: Phosphorus: 3.4 mg/dL (ref 2.5–4.6)

## 2019-03-27 LAB — URINE CULTURE: Culture: >=100000 — AB

## 2019-03-27 MED ORDER — INSULIN ASPART 100 UNIT/ML ~~LOC~~ SOLN: 0.0000 [IU] | Freq: Three times a day (TID) | SUBCUTANEOUS | 11 refills | Status: DC

## 2019-03-27 MED ORDER — NITROFURANTOIN MACROCRYSTAL 100 MG PO CAPS
100.0000 mg | ORAL_CAPSULE | Freq: Two times a day (BID) | ORAL | Status: DC
  Administered 2019-03-27: 100 mg via ORAL
  Filled 2019-03-27 (×2): qty 1

## 2019-03-27 MED ORDER — NITROFURANTOIN MACROCRYSTAL 50 MG PO CAPS: 100.0000 mg | ORAL_CAPSULE | Freq: Two times a day (BID) | ORAL | 0 refills | Status: DC

## 2019-03-27 MED ORDER — ACETAMINOPHEN-CODEINE #3 300-30 MG PO TABS: 1.0000 | ORAL_TABLET | Freq: Four times a day (QID) | ORAL | 0 refills | Status: DC | PRN

## 2019-03-27 MED ORDER — NITROFURANTOIN MACROCRYSTAL 100 MG PO CAPS: 100.0000 mg | ORAL_CAPSULE | Freq: Two times a day (BID) | ORAL | 0 refills | Status: AC

## 2019-03-27 MED ORDER — ALPRAZOLAM 0.5 MG PO TABS: 0.5000 mg | ORAL_TABLET | Freq: Three times a day (TID) | ORAL | 0 refills | Status: DC | PRN

## 2019-03-27 MED ORDER — INSULIN ASPART 100 UNIT/ML ~~LOC~~ SOLN: 0.0000 [IU] | Freq: Every day | SUBCUTANEOUS | 11 refills | Status: DC
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
## 2019-03-27 NOTE — Consult Note (Signed)
Orthopaedic Trauma Service (OTS) Consult   Sandra Ferrell ID: RYANE KONIECZNY MRN: [id number] DOB/AGE: 1961/06/11 58 y.o.   Reason for Consult: Left distal femur nonunion Referring Physician: Milly Jakob, MD (orthopedic surgery)   HPI: Sandra Ferrell is an 58 y.o. white female with an extensive medical history who sustained a ground-level fall resulting in a closed left distal femur fracture back in August 2019.  Sandra Ferrell was brought to the hospital.  She was taken to the operating room by Dr. Grandville Silos where intramedullary nailing was performed.  Excellent alignment and nail placement was noted on the intraoperative and postoperative x-rays.  Subsequently discharged to skilled nursing facility.  Sandra Ferrell had poor follow-up in the postoperative period.  She missed many appointments.  It also appears that she was noncompliant with therapy.  At some point she did follow-up and it was found to have loosening of her distal screws.  She was taken to the operating room for removal of distal locking bolt from her retrograde femoral nail, October 2019.  Subsequently discharged from the hospital again with instructions for therapy.  She was last seen and evaluated by Dr. Grandville Silos in February 2020.  CT scan was obtained which demonstrated nonunion of her distal femur fracture.  Vitamin D level was also obtained and February which was notable for severe vitamin D deficiency at 8.0 ng/mL, Sandra Ferrell was started on vitamin D2 50,000 units weekly at that time.  Sandra Ferrell states that she is continuing to take this.  It sounds as a low intensity pulsed ultrasound bone stimulator was obtained for the Sandra Ferrell she claims that she has been using it for the last 2 months.  Sandra Ferrell now admitted on 03/25/2019 for altered mental status and was found to have a urinary tract infection, continue the left distal femur pain and knee pain.  A repeat CT scan continues to demonstrate distal femur nonunion without hardware fracture.   Due to the complexity of the situation the orthopedic trauma service was consulted for additional evaluation and recommendations.  During this admission it is noted that her hemoglobin A1c is 13.7%, her last hemoglobin A1c in February 2028 was 8.0%.  CBG on admission this hospitalization was 455.  Interestingly her hemoglobin A1c of October 2019 was 4.9% and her hemoglobin A1c at the time of her original fracture was 6.5%  Sandra Ferrell states that she has a history of nicotine use but quit back in August after her original fracture.   Lengthy discussion had with the Sandra Ferrell.  She states that the last time she had walked was prior to her original fracture.  Since that time she either spent all of her time in bed or in a wheelchair.  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  She continues to complain of severe knee pain which does radiate up her thigh.  States that she does have some diabetic peripheral neuropathy is on gabapentin.  Although she states that her sensation is unchanged to her left leg.   Sandra Ferrell lives in a single-story house with everything on level.  Reports that her living situation is somewhat difficult.  Difficult to ascertain the exact dynamics of her social situation.   Sandra Ferrell also has a history of multiple back surgeries.  She states that she is not on any chronic narcotics.   Sandra Ferrell information reviewed in the [place] controlled substance database.  She does get regular prescriptions for alprazolam.  She has not had  any narcotic prescriptions it several months with the exception of a new prescription written on 03/19/2019 due to her acute increase in pain.  Past Medical History:  Diagnosis Date  . Allergic rhinitis, cause unspecified   . Anemia   . Anxiety state, unspecified   . Backache, unspecified   . Calculus of kidney   . Carpal tunnel syndrome, right   . CHF (congestive heart failure) ([place])    unspecified   . Chronic pain  syndrome   . Constipation   . COPD (chronic obstructive pulmonary disease) ([place])   . Cough   . Depressive disorder, not elsewhere classified    managed with medications  . Difficulty in walking   . Displaced intertrochanteric fracture of left femur ([place])   . Esophageal reflux   . Extrinsic asthma with exacerbation   . Fibromyalgia   . Heart murmur   . Hyperlipidemia   . Hypertension   . Muscle weakness (generalized)   . Nicotine dependence, cigarettes, uncomplicated   . Osteoarthrosis, unspecified whether generalized or localized, unspecified site   . Other abnormalities of gait and mobility   . Other and unspecified hyperlipidemia   . Other irritable bowel syndrome   . Other screening mammogram   . Personal history of unspecified urinary disorder   . Pneumonia   . Pressure ulcer of sacral region   . Routine general medical examination at a health care facility   . Routine gynecological examination   . Tobacco use disorder   . Type II or unspecified type diabetes mellitus without mention of complication, not stated as uncontrolled   . Unspecified fall, subsequent encounter   . Unspecified sleep apnea   . Urinary tract infection   . Vaginitis   . Wears glasses   . Wheezing     Past Surgical History:  Procedure Laterality Date  . ANTERIOR CERVICAL DECOMP/DISCECTOMY FUSION N/A 01/19/2015   Procedure: ANTERIOR CERVICAL DECOMPRESSION/DISCECTOMY FUSION 2 LEVELS;  Surgeon: Phylliss Bob, MD;  Location: [facility];  Service: Orthopedics;  Laterality: N/A;  Anterior cervical decompression fusion, cervical 5-6, cervical 6-7 with instrumentation and allograft  . ANTERIOR CERVICAL DECOMP/DISCECTOMY FUSION N/A 05/23/2017   Procedure: ANTERIOR CERVICAL DECOMPRESSION FUSION, CERVICAL 4-5 WITH INSTRUMENTATION AND ALLOGRAFT;  Surgeon: Phylliss Bob, MD;  Location: [facility];  Service: Orthopedics;  Laterality: N/A;  ANTERIOR CERVICAL DECOMPRESSION FUSION, CERVICAL 4-5 WITH INSTRUMENTATION AND  ALLOGRAFT; REQUEST 2 HOURS AND FLIP ROOM  . APPENDECTOMY  1973  . BACK SURGERY    . CHOLECYSTECTOMY  08/2006  . COLONOSCOPY    . HARDWARE REMOVAL Left 08/20/2018   Procedure: HARDWARE REMOVAL FROM LEFT KNEE;  Surgeon: Milly Jakob, MD;  Location: [facility];  Service: Orthopedics;  Laterality: Left;  . ORIF FEMUR FRACTURE Left 06/29/2018   Procedure: OPEN REDUCTION INTERNAL FIXATION (ORIF) DISTAL FEMUR FRACTURE;  Surgeon: Milly Jakob, MD;  Location: [facility];  Service: Orthopedics;  Laterality: Left;  . TRANSTHORACIC ECHOCARDIOGRAM  02/2011   mild LVH, nl EF, mild diastolic dysfunction, no wall motion abnl    Family History  Problem Relation Age of Onset  . Stroke Father   . Colon cancer Cousin     Social History:  reports that she has quit smoking. Her smoking use included cigarettes. She has a 35.00 pack-year smoking history. She has never used smokeless tobacco. She reports that she does not drink alcohol or use drugs.  Allergies:  Allergies  Allergen Reactions  . Benazepril Anaphylaxis, Swelling and Other (See Comments)  Angioedema, throat swelling  . Diclofenac Sodium Other (See Comments)    GI bleed  . Fluticasone-Salmeterol Hives, Itching and Swelling    Tongue was swollen  . Nsaids Other (See Comments)    Rectal bleeding  . Penicillins Hives and Rash    Has Sandra Ferrell had a PCN reaction causing immediate rash, facial/tongue/throat swelling, SOB or lightheadedness with hypotension: no Has Sandra Ferrell had a PCN reaction causing severe rash involving mucus membranes or skin necrosis: no Has Sandra Ferrell had a PCN reaction that required hospitalization no Has Sandra Ferrell had a PCN reaction occurring within the last 10 years: no If all of the above answers are "NO", then may proceed with Cephalosporin use.   Marland Kitchen Morphine And Related     rash  . Aspirin Other (See Comments)    Burns stomach  . Propoxyphene Other (See Comments)    Darvocet - Headache    Medications:  I have reviewed  the Sandra Ferrell's current medications. Prior to Admission:  Medications Prior to Admission  Medication Sig Dispense Refill Last Dose  . acetaminophen (TYLENOL) 325 MG tablet Take 650 mg by mouth every 6 (six) hours as needed for mild pain or headache.   03/24/2019 at Unknown time  . acetaminophen-codeine (TYLENOL #3) 300-30 MG tablet Take 1 tablet by mouth 2 (two) times daily as needed for moderate pain.    03/24/2019 at Unknown time  . ALPRAZolam (XANAX) 0.5 MG tablet TAKE 1 TABLET(0.5 MG) BY MOUTH THREE TIMES DAILY (Sandra Ferrell taking differently: Take 0.5 mg by mouth 3 (three) times daily. ) 90 tablet 0 03/24/2019 at Unknown time  . amitriptyline (ELAVIL) 75 MG tablet Take 1 tablet (75 mg total) by mouth at bedtime. 10 tablet 0 03/21/2019 at Unknown time  . budesonide-formoterol (SYMBICORT) 160-4.5 MCG/ACT inhaler INHALE 2 PUFFS INTO THE LUNGS TWICE DAILY (Sandra Ferrell taking differently: Inhale 2 puffs into the lungs 2 (two) times daily. ) 10.2 g 2 03/24/2019 at Unknown time  . dimenhyDRINATE (DRAMAMINE PO) Take 1 tablet by mouth daily as needed (nausea).   03/24/2019 at Unknown time  . feeding supplement, GLUCERNA SHAKE, (GLUCERNA SHAKE) LIQD Take 237 mLs by mouth 2 (two) times daily between meals. (Sandra Ferrell taking differently: Take 237 mLs by mouth 2 (two) times daily as needed (low sugar). ) 10 Can 0 Past Week at Unknown time  . furosemide (LASIX) 20 MG tablet TAKE 1 TABLET(20 MG) BY MOUTH DAILY (Sandra Ferrell taking differently: Take 20 mg by mouth daily. ) 90 tablet 0 Past Week at Unknown time  . gabapentin (NEURONTIN) 300 MG capsule TAKE 1 CAPSULE BY MOUTH IN THE MORNING, 1 CAPSULE IN THE AFTERNOON, AND 2 CAPSULES AT BEDTIME (Sandra Ferrell taking differently: Take 300-600 mg by mouth 3 (three) times daily. 365m am 3047mnoon 60052mm) 120 capsule 0 03/24/2019 at Unknown time  . gemfibrozil (LOPID) 600 MG tablet TAKE 1 TABLET BY MOUTH TWICE DAILY (Sandra Ferrell taking differently: Take 600 mg by mouth 2 (two) times daily before a  meal. ) 60 tablet 5 03/24/2019 at Unknown time  . ibuprofen (ADVIL) 200 MG tablet Take 600 mg by mouth every 6 (six) hours as needed for headache or mild pain.   03/24/2019 at Unknown time  . Insulin Glargine (LANTUS SOLOSTAR) 100 UNIT/ML Solostar Pen ADMINISTER 35 UNITS UNDER THE SKIN AT BEDTIME (Sandra Ferrell taking differently: Inject 30 Units into the skin at bedtime. ) 15 mL 5 03/24/2019 at Unknown time  . Liniments (SALONPAS PAIN RELIEF PATCH EX) Apply 1 patch topically as needed (pain).  03/19/2019  . metFORMIN (GLUCOPHAGE) 1000 MG tablet TAKE 1 TABLET BY MOUTH TWICE DAILY WITH MEALS (Sandra Ferrell taking differently: Take 1,000 mg by mouth 2 (two) times daily with a meal. ) 180 tablet 1 03/24/2019 at Unknown time  . methocarbamol (ROBAXIN) 500 MG tablet Take 2 tablets (1,000 mg total) by mouth at bedtime as needed for muscle spasms. 60 tablet 2 03/24/2019 at Unknown time  . omeprazole (PRILOSEC OTC) 20 MG tablet Take 20 mg by mouth daily.    03/24/2019 at Unknown time  . OVER THE COUNTER MEDICATION Apply 1 application topically daily as needed (pain). Hempvana   Past Week at Unknown time  . OVER THE COUNTER MEDICATION Take 2 tablets by mouth daily. OTC 8 hour allergy pill   03/24/2019 at Unknown time  . pioglitazone (ACTOS) 45 MG tablet TAKE 1 TABLET BY MOUTH EVERY DAY (Sandra Ferrell taking differently: Take 45 mg by mouth daily. ) 90 tablet 1 03/24/2019 at Unknown time  . PROAIR HFA 108 (90 Base) MCG/ACT inhaler TAKE 2 PUFFS BY MOUTH EVERY 4 HOURS AS NEEDED FOR WHEEZE OR FOR SHORTNESS OF BREATH (Sandra Ferrell taking differently: Inhale 2 puffs into the lungs every 4 (four) hours as needed for wheezing or shortness of breath. ) 8.5 Inhaler 5 > 1 month  . Vitamin D, Ergocalciferol, (DRISDOL) 1.25 MG (50000 UT) CAPS capsule Take 1 capsule (50,000 Units total) by mouth every 7 (seven) days. 5 capsule 0 03/22/2019  . ACCU-CHEK AVIVA PLUS test strip TEST BLOOD SUGAR TWICE DAILY AND DIRECTED 100 each 11 Taking  . ACCU-CHEK SOFTCLIX  LANCETS lancets CHECK BLOOD SUGAR TWICE DAILY AND AS DIRECTED 100 each 11 Taking  . AIMSCO INSULIN SYR ULTRA THIN 31G X 5/16" 0.3 ML MISC    Taking  . Alcohol Swabs PADS Check blood sugar twice a day and as directed. Dx E11.9 100 each 5 Taking  . B-D ULTRAFINE III SHORT PEN 31G X 8 MM MISC USE AS DIRECTED WITH INSULIN PEN 100 each 11 Taking  . Blood Glucose Monitoring Suppl (ACCU-CHEK AVIVA PLUS) w/Device KIT Check blood sugar twice daily and as directed. 1 kit 0 Taking  . dicyclomine (BENTYL) 20 MG tablet Take 1 tablet (20 mg total) 2 (two) times daily by mouth. (Sandra Ferrell not taking: Reported on 03/25/2019) 20 tablet 0 Not Taking at Unknown time  . Lancet Devices (ADJUSTABLE LANCING DEVICE) MISC Check blood sugar twice a day and as directed. Dx E11.9 100 each 5 Taking  . sulfamethoxazole-trimethoprim (BACTRIM DS) 800-160 MG tablet Take 1 tablet by mouth 2 (two) times daily. (Sandra Ferrell not taking: Reported on 03/25/2019) 6 tablet 0 Not Taking at Unknown time    Results for orders placed or performed during the hospital encounter of 03/25/19 (from the past 48 hour(s))  Glucose, capillary     Status: Abnormal   Collection Time: 03/25/19  5:14 PM  Result Value Ref Range   Glucose-Capillary 276 (H) 70 - 99 mg/dL   Comment 1 Notify RN    Comment 2 Document in Chart   Glucose, capillary     Status: Abnormal   Collection Time: 03/25/19  9:21 PM  Result Value Ref Range   Glucose-Capillary 323 (H) 70 - 99 mg/dL  Basic metabolic panel     Status: Abnormal   Collection Time: 03/26/19  6:42 AM  Result Value Ref Range   Sodium 135 135 - 145 mmol/L   Potassium 3.9 3.5 - 5.1 mmol/L   Chloride 107 98 - 111 mmol/L  CO2 23 22 - 32 mmol/L   Glucose, Bld 260 (H) 70 - 99 mg/dL   BUN 8 6 - 20 mg/dL   Creatinine, Ser 0.58 0.44 - 1.00 mg/dL   Calcium 7.9 (L) 8.9 - 10.3 mg/dL   GFR calc non Af Amer >60 >60 mL/min   GFR calc Af Amer >60 >60 mL/min   Anion gap 5 5 - 15    Comment: Performed at [facility], [place] [address] [place], [place] [address]  CBC     Status: Abnormal   Collection Time: 03/26/19  6:42 AM  Result Value Ref Range   WBC 7.1 4.0 - 10.5 K/uL   RBC 3.83 (L) 3.87 - 5.11 MIL/uL   Hemoglobin 10.5 (L) 12.0 - 15.0 g/dL   HCT 33.7 (L) 36.0 - 46.0 %   MCV 88.0 80.0 - 100.0 fL   MCH 27.4 26.0 - 34.0 pg   MCHC 31.2 30.0 - 36.0 g/dL   RDW 13.7 11.5 - 15.5 %   Platelets 304 150 - 400 K/uL   nRBC 0.0 0.0 - 0.2 %    Comment: Performed at [facility], [place] [address], [place], [place] [address]  Magnesium     Status: Abnormal   Collection Time: 03/26/19  6:42 AM  Result Value Ref Range   Magnesium 1.4 (L) 1.7 - 2.4 mg/dL    Comment: Performed at [facility], [place] [address] [place], [place] [address]  Glucose, capillary     Status: Abnormal   Collection Time: 03/26/19  7:48 AM  Result Value Ref Range   Glucose-Capillary 213 (H) 70 - 99 mg/dL   Comment 1 Notify RN    Comment 2 Document in Chart   Glucose, capillary     Status: Abnormal   Collection Time: 03/26/19 11:54 AM  Result Value Ref Range   Glucose-Capillary 340 (H) 70 - 99 mg/dL   Comment 1 Notify RN    Comment 2 Document in Chart   Glucose, capillary     Status: Abnormal   Collection Time: 03/26/19  4:45 PM  Result Value Ref Range   Glucose-Capillary 204 (H) 70 - 99 mg/dL   Comment 1 Notify RN    Comment 2 Document in Chart   Glucose, capillary     Status: Abnormal   Collection Time: 03/26/19  8:39 PM  Result Value Ref Range   Glucose-Capillary 236 (H) 70 - 99 mg/dL  Glucose, capillary     Status: Abnormal   Collection Time: 03/27/19  7:43 AM  Result Value Ref Range   Glucose-Capillary 213 (H) 70 - 99 mg/dL   Comment 1 Notify RN    Comment 2 Document in Chart   Glucose, capillary     Status: Abnormal   Collection Time: 03/27/19 11:56 AM  Result Value Ref Range   Glucose-Capillary 190 (H) 70 - 99 mg/dL   Comment 1 Notify RN    Comment 2 Document  in Chart     Ct Abdomen Pelvis Wo Contrast  Result Date: 03/25/2019 CLINICAL DATA:  Pain. History of recurrent UTIs. Back pain and increasing flank pain bilaterally. EXAM: CT ABDOMEN AND PELVIS WITHOUT CONTRAST TECHNIQUE: Multidetector CT imaging of the abdomen and pelvis was performed following the standard protocol without IV contrast. COMPARISON:  CT dated 12/24/2018 FINDINGS: Lower chest: There is atelectasis at the left lung base. The remaining portions of the lung bases are unremarkable. The Hepatobiliary: No focal liver abnormality is seen.  Status post cholecystectomy. No biliary dilatation. Pancreas: Unremarkable. No pancreatic ductal dilatation or surrounding inflammatory changes. Spleen: Normal in size without focal abnormality. Adrenals/Urinary Tract: The bilateral adrenal glands are unremarkable. There is no hydronephrosis. There are no radiopaque obstructing kidney stones. There is mild wall thickening of the urinary bladder with adjacent fat stranding. Stomach/Bowel: There is a moderate amount of stool in the colon. Oral contrast is seen to the level of the sigmoid colon. There are air-fluid levels in the transverse colon consistent with liquid stool. There is no evidence of a small-bowel obstruction. The appendix is not well identified. The stomach is unremarkable. Vascular/Lymphatic: Atherosclerotic changes are noted of the abdominal aorta. There are prominent external iliac lymph nodes on the left measuring up to 1.1 cm. There prominent left inguinal lymph nodes measuring up to 1.1 cm. Reproductive: Uterus and bilateral adnexa are unremarkable. Other: No abdominal wall hernia or abnormality. No abdominopelvic ascites. Musculoskeletal: No acute or significant osseous findings. The Sandra Ferrell is status post placement of an intramedullary nail through the proximal left femur. This is only partially visualized. IMPRESSION: 1. Mild diffuse bladder wall thickening with adjacent fat stranding.  Correlation with urinalysis is recommended to exclude a cystitis. 2. Enlarged left inguinal and left external iliac lymph nodes, presumably reactive. Correlation for a left lower extremity infectious process is recommended. 3. Liquid stool, consistent with diarrhea. 4.  Aortic Atherosclerosis (ICD10-I70.0). Electronically Signed   By: Constance Holster M.D.   On: 03/25/2019 15:31   Ct Femur Left Wo Contrast  Result Date: 03/25/2019 CLINICAL DATA:  Sandra Ferrell with history of distal femur fracture in August 2019 status post ORIF, complicated by a screw backing out and underwent screw removal October 2019. Sandra Ferrell has been previously at her rehab and now at home with home health therapy. EXAM: CT OF THE LOWER LEFT EXTREMITY WITHOUT CONTRAST TECHNIQUE: Multidetector CT imaging of the lower left extremity was performed according to the standard protocol. COMPARISON:  None. FINDINGS: Bones/Joint/Cartilage Severe osteopenia likely secondary to disuse. Comminuted distal femoral diametaphyseal fracture without significant surrounding callus formation transfixed with a intramedullary nail and 2 proximal interlocking screws and 3 distal interlocking screws. Lucency around the tip of the distal 3 interlocking screws most concerning for loosening. Lucency around the intramedullary nail along the distal metaphysis also concerning for loosening. No other fracture or dislocation. Normal alignment. No joint effusion. Ligaments Ligaments are suboptimally evaluated by CT. Muscles and Tendons Muscles are normal. No intramuscular fluid collection or hematoma. Quadriceps tendon is intact. Soft tissue No fluid collection or hematoma.  No soft tissue mass. IMPRESSION: 1. Comminuted ununited distal femoral diametaphyseal fracture transfixed with a intramedullary nail and interlocking screws without significant bridging callus formation. Lucency around the tip of the distal 3 interlocking screws most concerning for loosening. Lucency  around the intramedullary nail along the distal metaphysis also concerning for loosening. Electronically Signed   By: Kathreen Devoid   On: 03/25/2019 15:32    Review of Systems  Constitutional: Negative for chills and fever.  Respiratory: Negative for shortness of breath and wheezing.   Cardiovascular: Negative for chest pain and palpitations.  Gastrointestinal: Negative for abdominal pain, nausea and vomiting.  Musculoskeletal:       Left knee flexion contracture   Blood pressure 115/61, pulse 84, temperature 97.7 F (36.5 C), resp. rate 16, height 4' 10"  (1.473 m), weight 86.5 kg, SpO2 95 %. Physical Exam Vitals signs and nursing note reviewed.  Constitutional:      Comments: Awake and alert,  no acute distress, appears older than stated age  Cardiovascular:     Rate and Rhythm: Normal rate and regular rhythm.     Heart sounds: S1 normal and S2 normal.  Pulmonary:     Effort: No accessory muscle usage or respiratory distress.     Comments: Clear anterior fields Abdominal:     Comments: Soft, nontender, nondistended, + bowel sounds  Genitourinary:    Comments: Pure wick in place Musculoskeletal:     Comments: Left lower extremity Inspection: Left knee is flexed beyond 90 degrees and is essentially under the Sandra Ferrell Sandra Ferrell sitting upright in bed Left leg is somewhat dysmorphic No gross abnormalities noted to the lower leg ankle or foot. Sandra Ferrell has a relatively short appearing thigh which is symmetric to the contralateral side No erythema or knee effusion appreciated  Bony eval: Hip is nontender with palpation.  No significant groin pain with evaluation Exquisite tenderness with palpation over her distal femur diffusely Lower leg, ankle and foot are nontender Attempted to manipulate the Sandra Ferrell's left knee but there does appear to be fairly stiff and fixed contracture.  I was able to possibly move her about 10 degrees of extension from her resting position before she has  significant pain and block was appreciated  Soft tissue: All surgical wounds are well-healed Marked sites are noted over her distal thigh wound and bone stimulator No appreciable knee effusion noted No open wounds or lesions, no erythema to her thigh or knee Due to her flexion contracture very difficult to assess knee stability  ROM: Restricted due to knee contracture  Sensation: DPN, SPN, TN sensory functions grossly intact per Sandra Ferrell's report.  States that symmetric to the contralateral side  Motor: EHL, FHL, lesser toe motor functions are intact.  Ankle flexion, extension, inversion and eversion are grossly intact as well  Vascular: Extremity is warm, palpable DP pulse Compartments are soft No significant swelling distally, no pitting edema   Skin:    General: Skin is warm.  Neurological:     Mental Status: She is alert and oriented to person, place, and time.     Comments: Unable to assess coordination and gait  Psychiatric:        Attention and Perception: Attention normal.        Behavior: Behavior is cooperative.      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  I think there are many factors at play contributing to her nonunion first and foremost her poorly controlled diabetes.  Although I do find it quite interesting as the progression of her A1c's over the last 9 to 10 months.  She was around 6% at the time of her original admission, in October 2019 her A1c showed very good control and that from that point forward there was a precipitous rise in her A1c.  This would suggest noncompliance on any fronts.   Optimizing her blood sugar control and her vitamin D levels will be paramount for what ever intervention is undertaken.  She does have a  bone growth stimulator.  I would like to interrogate the machine to evaluate her compliance.  We will also check nicotine metabolites to confirm that she has indeed quit nicotine products   Upon further review of her most recent CT scan it does appear that the distal aspect intramedullary  nail is cut out in the area of her trochlea.  She will ultimately need her hardware removed but the question is and what sequence should be performed.  This is further complicated by her profound knee flexion contracture.   Several treatment strategies can be evaluated.  We could consider several options    1.  Arthroscopic debridement and lysis of adhesions left knee with manipulation under anesthesia with exchange nailing    2.  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  Out current implant to see if we can achieve as there is no catastrophic failure of her hardware.  Once union is achieved consider for total joint replacements suspect that this would be a distal femoral placement.  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  We would need to discuss with 1 of our total joint colleagues who specializes replacements.   Whichever modality is considered we really do need to complete comprehensive metabolic bone work-up including bone density scan.  Definitely need to identify additional parameters that could be corrected.  Obviously her blood sugars need to be corrected first and foremost.  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   Sandra Ferrell is severely deconditioned from having been essentially bedridden since  August 2019.  - Pain management:  Would request that Sandra Ferrell received pain medicine from her PCP.  Obviously if we were to operate on Sandra Ferrell we can then see him that management will.  - Metabolic Bone Disease:  Comprehensive metabolic bone work-up.  Labs have been ordered  Sandra Ferrell has history of vitamin D deficiency.  I would like to see if her labs have improved at all since February.  I would be very surprised there has been improvement.    Recommend continuing vitamin D2 50,000 IUs weekly and would also strongly recommend vitamin D3 5000 IUs daily along with vitamin C 502,000 mg daily.  Would not add calcium until her labs have returned and they could be evaluated.  Can arrange for bone density scan as an outpatient.  Suspect that her T-scores will be quite low as she has been nonweightbearing for over 9 months  - Activity:  Bed to chair.  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  Aggressive blood sugar control  Follow-up with the orthopedic trauma service in 2 weeks at which time we will be able to review her labs and formulate a treatment plan moving forward.   Greatly appreciate the consult [place] Silos and we look forward to providing care for his Sandra Ferrell in this complex situation    Jari Pigg, PA-C [phone] (C) 03/27/2019, 12:25 PM  [facility] [place] [address] [phone] (F)

## 2019-03-27 NOTE — Discharge Summary (Addendum)
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 clinical  observations, patient history, and epidemiological information. If result is POSITIVE SARS-CoV-2 target nucleic acids are DETECTED. The SARS-CoV-2 RNA is generally  detectable in upper and lower  respiratory specimens dur ing the acute phase of infection.  Positive  results are indicative of active infection with SARS-CoV-2.  Clinical  correlation with patient history and other diagnostic information is  necessary to determine patient infection status.  Positive results do  not rule out bacterial infection or co-infection with other viruses. If result is PRESUMPTIVE POSTIVE SARS-CoV-2 nucleic acids MAY BE PRESENT.   A presumptive positive result was obtained on the submitted  specimen  and confirmed on repeat testing.  While 2019 novel coronavirus  (SARS-CoV-2) nucleic acids may be present in the submitted sample  additional confirmatory testing may be necessary for epidemiological  and / or clinical management purposes  to differentiate between  SARS-CoV-2 and other Sarbecovirus currently known to infect humans.  If clinically indicated additional testing with an alternate test  methodology [phone]) is advised. The SARS-CoV-2 RNA is generally  detectable in upper and lower respiratory sp ecimens during the acute  phase of infection. The expected result is Negative. Fact Sheet for Patients:  StrictlyIdeas.no Fact Sheet for Healthcare Providers: BankingDealers.co.za This test is not yet approved or cleared by the [place] FDA and has been authorized for detection and/or diagnosis of SARS-CoV-2 by FDA under an Emergency Use Authorization (EUA).  This EUA will remain in effect (meaning this test can be used) for the duration of the COVID-19 declaration under Section 564(b)(1) of the Act, 21 U.S.C. section 360bbb-3(b)(1), unless the authorization is terminated or revoked sooner. Performed at [facility], [place] [address] [place], [place] [address]   Urine culture     Status: Abnormal   Collection Time: 03/25/19  5:37 AM  Result Value Ref Range Status   Specimen Description   Final    URINE, CATHETERIZED Performed at [facility] [address], [place], [place] [address]    Special Requests   Final    NONE Performed at [facility], [place] [address], [place], [place] [address]    Culture (A)  Final    >=100,000 COLONIES/mL STAPHYLOCOCCUS SPECIES (COAGULASE NEGATIVE)   Report Status 03/27/2019 FINAL  Final   Organism ID, Bacteria STAPHYLOCOCCUS SPECIES (COAGULASE NEGATIVE) (A)  Final      Susceptibility   Staphylococcus species (coagulase  negative) - MIC*    CIPROFLOXACIN 2 INTERMEDIATE Intermediate     GENTAMICIN >=16 RESISTANT Resistant     NITROFURANTOIN <=16 SENSITIVE Sensitive     OXACILLIN 2 RESISTANT Resistant     TETRACYCLINE <=1 SENSITIVE Sensitive     VANCOMYCIN <=0.5 SENSITIVE Sensitive     TRIMETH/SULFA 160 RESISTANT Resistant     CLINDAMYCIN >=8 RESISTANT Resistant     RIFAMPIN <=0.5 SENSITIVE Sensitive     Inducible Clindamycin NEGATIVE Sensitive     * >=100,000 COLONIES/mL STAPHYLOCOCCUS SPECIES (COAGULASE NEGATIVE)     Time coordinating discharge: 23mnutes  SIGNED:   EGeorgette Shell MD  [facility] 03/27/2019, 11:05 AM Pager   If 7PM-7AM, please contact night-coverage [email or web address] Password [email or web address]

## 2019-03-27 NOTE — Progress Notes (Signed)
Orthopaedic Trauma Service   Pt seen and examined Full consult to follow    9 months s/p closed L distal femur fracture s/p IMN  + nonunion with severe knee flexion contracture Severe tenderness to left distal femur  Complex clinical picture with complex decision making  There is definitely an element of noncompliance present  Suspect that her uncontrolled DM is directly at play with respect to her vitamin d deficiency and her nonunion   Metabolic bone work up, labs ordered prior to DC Will need DEXA as out patient  Follow up with orthopaedic trauma specialists in 2 weeks. Will review labs and plan  Pt unable to WB on L leg due to flexion contracture. States has not ambulated since before her original fall Is in either the bed or WC  Continue with bone stim   Most important issue to address is her sugar control.  Any subsequent surgery would be extremely risky with blood sugars >200.  This dramatically increases her risk of persistent nonunion and deep infection exponentially. Would not advocate any additional surgical intervention until good control established as there is not catastrophic failure of her implants   Jari Pigg, PA-C [phone] (C) 03/27/2019, 12:31 PM  [facility] [place] [address] [phone] (F)

## 2019-03-27 NOTE — Progress Notes (Signed)
Pt discharged home from unit via PTAR. Discharge instructions given. Scripts sent to pharmacy of choice.  Pt verbalized understanding. No immediate questions or concerns given at this time.

## 2019-03-27 NOTE — Progress Notes (Signed)
Physical Therapy Treatment Patient Details Name: Sandra Ferrell MRN: [id number] DOB: 17-Jul-1961 Today's Date: 03/27/2019    History of Present Illness 58 y.o. female with medical history significant of anemia chronic back pain, kidney stones, CHF, COPD, asthma, fibromyalgia,  hyperlipidemia, hypertension, DM2 , recent distal femur fracture s/p ORIF 06/29/2018 with hardware removal 08/20/2018. Pt admitted for hyperglycemia, UTI, encephalopathy.      PT Comments    Pt friendly and willing to participate with therapy.  Pt limited by Lt LE pain, RN aware.  Pt improving strength and overall bed mobility with less assistance required for supine to sit.  Pt able to follow all cues to assist with handplacement and trunk rotation with min-mod A.  Therex complete for LE strenthening and knee mobility with some modification and AAROM required due to Lt LE weakness and knee mobility.  No reports of increased pain through session.  EOS pt left with ice rolled in towel over medial aspect of Lt knee for pain control, pt educated on proper time to remove, RN aware and wrote time for ice removal on dry erase board.  EOS pt left with call bell within reach and RN aware of status.  Social worked entered and short discussion about discharge.  Pt reports her husband really wants to come home, stated he has had difficulty paying bills without her home to assist.  Pt also stated she feels more beneficial with DC to SNF for rehab.   Follow Up Recommendations  SNF;Supervision/Assistance - 24 hour;CIR     Equipment Recommendations  None recommended by PT    Recommendations for Other Services       Precautions / Restrictions Precautions Precautions: Fall Restrictions Weight Bearing Restrictions: No Other Position/Activity Restrictions: Unsure of WB status, pt has not WB on LLE since August    Mobility  Bed Mobility Overal bed mobility: Needs Assistance Bed Mobility: Supine to Sit     Supine to sit: Mod  assist;Min assist Sit to supine: Mod assist;Min assist   General bed mobility comments: Able to following cueing with use of handrails for rolling as well as scooting towards HOB, increased time and labored movements  Transfers                    Ambulation/Gait                 Stairs             Wheelchair Mobility    Modified Rankin (Stroke Patients Only)       Balance                                            Cognition Arousal/Alertness: Awake/alert Behavior During Therapy: WFL for tasks assessed/performed Overall Cognitive Status: Within Functional Limits for tasks assessed                                        Exercises General Exercises - Lower Extremity Quad Sets: AROM;Right;AAROM;Left;Strengthening;Supine(modified due to knee mobility) Short Arc Quad: Left;AAROM;AROM;Right;Supine(modified due to knee mobility) Long Arc Quad: Strengthening;AROM;Right;AAROM;Left;Seated Hip ABduction/ADduction: Strengthening;10 reps;Seated Hip Flexion/Marching: AROM;Strengthening;Both;10 reps;Seated Toe Raises: 20 reps;Both;Strengthening;AROM Heel Raises: Both;Strengthening;20 reps;Seated Other Exercises Other Exercises: Partial bridge supine 10x Other Exercises: UE flexion and scapular retraction 10x each  General Comments        Pertinent Vitals/Pain Pain Assessment: 0-10 Pain Score: 7  Pain Location: L leg Pain Descriptors / Indicators: Sore;Aching Pain Intervention(s): Limited activity within patient's tolerance;Monitored during session;Repositioned    Home Living                      Prior Function            PT Goals (current goals can now be found in the care plan section)      Frequency    Min 2X/week      PT Plan Current plan remains appropriate    Co-evaluation              AM-PAC PT "6 Clicks" Mobility   Outcome Measure  Help needed turning from your back to your  side while in a flat bed without using bedrails?: A Lot Help needed moving from lying on your back to sitting on the side of a flat bed without using bedrails?: A Lot Help needed moving to and from a bed to a chair (including a wheelchair)?: Total Help needed standing up from a chair using your arms (e.g., wheelchair or bedside chair)?: Total Help needed to walk in hospital room?: Total Help needed climbing 3-5 steps with a railing? : Total 6 Click Score: 8    End of Session   Activity Tolerance: Patient limited by fatigue;Patient limited by pain Patient left: in bed;with bed alarm set;with call bell/phone within reach(Ice applied to Lt knee, RN aware and wrote removal time on board for pain control) Nurse Communication: Mobility status PT Visit Diagnosis: Other abnormalities of gait and mobility (R26.89);Muscle weakness (generalized) (M62.81);Pain Pain - Right/Left: Left Pain - part of body: Hip;Knee;Leg     Time: 0930-1008 PT Time Calculation (min) (ACUTE ONLY): 38 min  Charges:  $Therapeutic Exercise: 8-22 mins $Therapeutic Activity: 23-37 mins                     [address] LPTA; CBIS [phone]   Aldona Lento 03/27/2019, 11:23 AM

## 2019-03-28 LAB — CALCIUM, IONIZED: Calcium, Ionized, Serum: 4.5 mg/dL (ref 4.5–5.6)

## 2019-03-28 LAB — PTH, INTACT AND CALCIUM
Calcium, Total (PTH): 8.4 mg/dL — ABNORMAL LOW (ref 8.7–10.2)
PTH: 21 pg/mL (ref 15–65)

## 2019-03-28 LAB — VITAMIN D 25 HYDROXY (VIT D DEFICIENCY, FRACTURES): Vit D, 25-Hydroxy: 26.9 ng/mL — ABNORMAL LOW (ref 30.0–100.0)

## 2019-03-29 DIAGNOSIS — M1990 Unspecified osteoarthritis, unspecified site: Secondary | ICD-10-CM | POA: Diagnosis not present

## 2019-03-29 DIAGNOSIS — D649 Anemia, unspecified: Secondary | ICD-10-CM | POA: Diagnosis not present

## 2019-03-29 DIAGNOSIS — B961 Klebsiella pneumoniae [K. pneumoniae] as the cause of diseases classified elsewhere: Secondary | ICD-10-CM | POA: Diagnosis not present

## 2019-03-29 DIAGNOSIS — J449 Chronic obstructive pulmonary disease, unspecified: Secondary | ICD-10-CM | POA: Diagnosis not present

## 2019-03-29 DIAGNOSIS — I5032 Chronic diastolic (congestive) heart failure: Secondary | ICD-10-CM | POA: Diagnosis not present

## 2019-03-29 DIAGNOSIS — I110 Hypertensive heart disease with heart failure: Secondary | ICD-10-CM | POA: Diagnosis not present

## 2019-03-29 DIAGNOSIS — K219 Gastro-esophageal reflux disease without esophagitis: Secondary | ICD-10-CM | POA: Diagnosis not present

## 2019-03-29 DIAGNOSIS — G894 Chronic pain syndrome: Secondary | ICD-10-CM | POA: Diagnosis not present

## 2019-03-29 DIAGNOSIS — E119 Type 2 diabetes mellitus without complications: Secondary | ICD-10-CM | POA: Diagnosis not present

## 2019-03-29 DIAGNOSIS — N12 Tubulo-interstitial nephritis, not specified as acute or chronic: Secondary | ICD-10-CM | POA: Diagnosis not present

## 2019-03-31 ENCOUNTER — Telehealth: Payer: Self-pay

## 2019-03-31 ENCOUNTER — Telehealth: Payer: Self-pay | Admitting: Family Medicine

## 2019-03-31 DIAGNOSIS — E119 Type 2 diabetes mellitus without complications: Secondary | ICD-10-CM | POA: Diagnosis not present

## 2019-03-31 DIAGNOSIS — I5032 Chronic diastolic (congestive) heart failure: Secondary | ICD-10-CM | POA: Diagnosis not present

## 2019-03-31 DIAGNOSIS — K219 Gastro-esophageal reflux disease without esophagitis: Secondary | ICD-10-CM | POA: Diagnosis not present

## 2019-03-31 DIAGNOSIS — N12 Tubulo-interstitial nephritis, not specified as acute or chronic: Secondary | ICD-10-CM | POA: Diagnosis not present

## 2019-03-31 DIAGNOSIS — M1990 Unspecified osteoarthritis, unspecified site: Secondary | ICD-10-CM | POA: Diagnosis not present

## 2019-03-31 DIAGNOSIS — I110 Hypertensive heart disease with heart failure: Secondary | ICD-10-CM | POA: Diagnosis not present

## 2019-03-31 DIAGNOSIS — J449 Chronic obstructive pulmonary disease, unspecified: Secondary | ICD-10-CM | POA: Diagnosis not present

## 2019-03-31 DIAGNOSIS — D649 Anemia, unspecified: Secondary | ICD-10-CM | POA: Diagnosis not present

## 2019-03-31 DIAGNOSIS — B961 Klebsiella pneumoniae [K. pneumoniae] as the cause of diseases classified elsewhere: Secondary | ICD-10-CM | POA: Diagnosis not present

## 2019-03-31 DIAGNOSIS — G894 Chronic pain syndrome: Secondary | ICD-10-CM | POA: Diagnosis not present

## 2019-03-31 LAB — CALCITRIOL (1,25 DI-OH VIT D): Vit D, 1,25-Dihydroxy: 49.9 pg/mL (ref 19.9–79.3)

## 2019-03-31 MED ORDER — GLUCOSE BLOOD VI STRP: ORAL_STRIP | 11 refills | Status: DC

## 2019-03-31 NOTE — Telephone Encounter (Signed)
Pt readmitted 03/25/19 Note from [facility] in my inbox today stated hold on rehab until imaging done, I am not sure if this has been completed... Please have them call her ortho Dr. [name] for specific recommendations on PT now.

## 2019-03-31 NOTE — Telephone Encounter (Signed)
Pt called to get refill for test strip she is out of these. Accu-chek aviva plus. Please send to [facility] at [facility] and [address] in [place]. She said she called pharmacy but didn't get anywhere with them due to holiday.

## 2019-03-31 NOTE — Telephone Encounter (Signed)
Refill sent as requested. 

## 2019-03-31 NOTE — Telephone Encounter (Signed)
Attempted to reach patient on primary phone. Spouse Jeneen Rinks answered call and stated to reach patient on home phone.   Attempted to reach patient on home phone. Phone rang multiple times with no answer.

## 2019-03-31 NOTE — Telephone Encounter (Signed)
[place] left v/m that pt was recently d/c from [facility] is to start Novolog and stop metformin.pt does not have novolog and [place]  Wants to know if pt should schedule virtual visit for FU? [place] request cb.

## 2019-03-31 NOTE — Telephone Encounter (Signed)
Vicente Males from [facility] called to request verbal orders.  Continue PT at home twice a week for 3 weeks. [phone]- [phone] to leave a msg

## 2019-03-31 NOTE — Telephone Encounter (Signed)
Pt does need virtual OV for HFU. Looks like hospital already sent in novolg.  to [facility], [place]

## 2019-03-31 NOTE — Telephone Encounter (Signed)
Left message for Sandra Ferrell that note from [facility] sent to Dr. Rometta Emery  inbox today stated hold on rehab until imaging done.  We are not sure if this has been completed yet..I advised them to call [name]'s ortho Dr. [name] for specific recommendations on PT now.

## 2019-04-01 DIAGNOSIS — J449 Chronic obstructive pulmonary disease, unspecified: Secondary | ICD-10-CM | POA: Diagnosis not present

## 2019-04-01 DIAGNOSIS — I110 Hypertensive heart disease with heart failure: Secondary | ICD-10-CM | POA: Diagnosis not present

## 2019-04-01 DIAGNOSIS — K219 Gastro-esophageal reflux disease without esophagitis: Secondary | ICD-10-CM | POA: Diagnosis not present

## 2019-04-01 DIAGNOSIS — I5032 Chronic diastolic (congestive) heart failure: Secondary | ICD-10-CM | POA: Diagnosis not present

## 2019-04-01 DIAGNOSIS — M1990 Unspecified osteoarthritis, unspecified site: Secondary | ICD-10-CM | POA: Diagnosis not present

## 2019-04-01 DIAGNOSIS — G894 Chronic pain syndrome: Secondary | ICD-10-CM | POA: Diagnosis not present

## 2019-04-01 DIAGNOSIS — N12 Tubulo-interstitial nephritis, not specified as acute or chronic: Secondary | ICD-10-CM | POA: Diagnosis not present

## 2019-04-01 DIAGNOSIS — B961 Klebsiella pneumoniae [K. pneumoniae] as the cause of diseases classified elsewhere: Secondary | ICD-10-CM | POA: Diagnosis not present

## 2019-04-01 DIAGNOSIS — E119 Type 2 diabetes mellitus without complications: Secondary | ICD-10-CM | POA: Diagnosis not present

## 2019-04-01 DIAGNOSIS — D649 Anemia, unspecified: Secondary | ICD-10-CM | POA: Diagnosis not present

## 2019-04-01 NOTE — Telephone Encounter (Signed)
[place] notified as instructed by telephone.  She is concerned about the dosing instructions of the Novolog.  She feels Mrs. Kerth would do better if she had specific units instead of a range. Novolog is on medication list twice with 2 different instructions.  [place] states patient also needs a Dexa Scan and patient was told to contact PCP to get that ordered.  Mrs. Menzel also need a refill on her Tylenol #3.  Please advise.

## 2019-04-01 NOTE — Telephone Encounter (Signed)
Transitional Care Management Follow-up Telephone Call    Date discharged? 03/27/19  How have you been since you were released from the hospital? [place].   Any patient concerns?  A. Concerned that left leg is not healing.  B. Request for refill for Tylenol 3. Pain scale: 8/10. States she has been discharged from ortho surgeon Dr. Grandville Silos. Has new patient appt with Dr. Doreatha Martin on 04/07/19. Dr. Doreatha Martin advised her to request PCP refill pain medication at this time.    Items Reviewed:  Medications reviewed: Yes  Allergies reviewed: Yes  Dietary changes reviewed: Yes  Referrals reviewed: Yes   Functional Questionnaire:  Independent - I Dependent - D    Activities of Daily Living (ADLs):    Personal hygiene - D - has home health aide Dressing - D - spouse or HHA Eating - I Maintaining continence - I Transferring - D - able to pivot on right leg; ambulates with wheelchair  Independent Activities of Daily Living (iADLs): spouse is primary caregiver Basic communication skills - I Transportation - D Meal preparation  - D Shopping - D Housework - D Managing medications - D  Managing personal finances - D   Confirmed importance and date/time of follow-up visits scheduled YES  Provider Appointment booked with PCP 04/02/19 @ 0930  Confirmed with patient if condition begins to worsen call PCP or go to the ER.  Patient was given the office number and encouraged to call back with question or concerns: YES

## 2019-04-01 NOTE — Addendum Note (Signed)
Addended by: Carter Kitten on: 04/01/2019 09:27 AM   Modules accepted: Orders

## 2019-04-02 ENCOUNTER — Ambulatory Visit (INDEPENDENT_AMBULATORY_CARE_PROVIDER_SITE_OTHER): Payer: Medicare Other | Admitting: Family Medicine

## 2019-04-02 ENCOUNTER — Other Ambulatory Visit: Payer: Medicare Other

## 2019-04-02 ENCOUNTER — Encounter: Payer: Self-pay | Admitting: Family Medicine

## 2019-04-02 VITALS — Ht <= 58 in

## 2019-04-02 DIAGNOSIS — Z8781 Personal history of (healed) traumatic fracture: Secondary | ICD-10-CM

## 2019-04-02 DIAGNOSIS — I10 Essential (primary) hypertension: Secondary | ICD-10-CM

## 2019-04-02 DIAGNOSIS — D508 Other iron deficiency anemias: Secondary | ICD-10-CM

## 2019-04-02 DIAGNOSIS — N390 Urinary tract infection, site not specified: Secondary | ICD-10-CM | POA: Diagnosis not present

## 2019-04-02 DIAGNOSIS — E119 Type 2 diabetes mellitus without complications: Secondary | ICD-10-CM | POA: Diagnosis not present

## 2019-04-02 DIAGNOSIS — T7691XD Unspecified adult maltreatment, suspected, subsequent encounter: Secondary | ICD-10-CM

## 2019-04-02 MED ORDER — ACETAMINOPHEN-CODEINE #3 300-30 MG PO TABS: 1.0000 | ORAL_TABLET | Freq: Two times a day (BID) | ORAL | 0 refills | Status: DC | PRN
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
## 2019-04-02 NOTE — Assessment & Plan Note (Signed)
Needs re-eval of cbc.. will request from home health.

## 2019-04-02 NOTE — Telephone Encounter (Signed)
Please call Sandra Ferrell with [facility] with update.   We need her to check a BMET and cbc if able this week. DEXA ordered.  PT instructions need to come from Dr. Mathews Argyle... she has appt on 04/07/19.  Pt instructed to continue Lantus 35 units daily as well as metfomrin as was previously on... CBGs are improving now she is compliant with meds.  She is hesitant about taking Novolog so we will hold for now. I think it will impede compliance with other meds.  She will check daily CBGs and have follow up for likely increase in Lantus.

## 2019-04-02 NOTE — Assessment & Plan Note (Signed)
Now resolved with antibiotics.

## 2019-04-02 NOTE — Telephone Encounter (Signed)
[place] notified as instructed by telephone.  One of the other nurses is seeing Sandra Ferrell today so [place] will call her to let her know to draw BMET & CBC.

## 2019-04-02 NOTE — Telephone Encounter (Signed)
Will discuss at today's OV
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
## 2019-04-02 NOTE — Patient Instructions (Signed)
Make sure to take medications. Continue lantus at 35 units at bedtime daily.  Continue metformin.  Can hold novolog unless blood sugars are very high.  Check blood sugar daily in morning at least fasting.  Close follow up to titrate up insulin in 2 weeks.

## 2019-04-02 NOTE — Assessment & Plan Note (Signed)
Poor  wound healing and possible nonunion of bone. Set up for DEXA. Needs better dibates control.  Has follow up with trauma ortho.

## 2019-04-02 NOTE — Assessment & Plan Note (Signed)
Well controlled. Continue current medication.  

## 2019-04-02 NOTE — Assessment & Plan Note (Signed)
Poor control with noncompliance.  Numbers improving some now back on lantus and metfomrin with slight increase in lantus dose.  Pt does not want to use novolog despite need for tight control for wound healing.  She will follow CBGs for 2 weeks then we will have a follow up visit to consider increasing lantus dose.

## 2019-04-03 DIAGNOSIS — J449 Chronic obstructive pulmonary disease, unspecified: Secondary | ICD-10-CM | POA: Diagnosis not present

## 2019-04-03 DIAGNOSIS — I5032 Chronic diastolic (congestive) heart failure: Secondary | ICD-10-CM | POA: Diagnosis not present

## 2019-04-03 DIAGNOSIS — G894 Chronic pain syndrome: Secondary | ICD-10-CM | POA: Diagnosis not present

## 2019-04-03 DIAGNOSIS — M1990 Unspecified osteoarthritis, unspecified site: Secondary | ICD-10-CM | POA: Diagnosis not present

## 2019-04-03 DIAGNOSIS — N12 Tubulo-interstitial nephritis, not specified as acute or chronic: Secondary | ICD-10-CM | POA: Diagnosis not present

## 2019-04-03 DIAGNOSIS — I110 Hypertensive heart disease with heart failure: Secondary | ICD-10-CM | POA: Diagnosis not present

## 2019-04-03 DIAGNOSIS — K219 Gastro-esophageal reflux disease without esophagitis: Secondary | ICD-10-CM | POA: Diagnosis not present

## 2019-04-03 DIAGNOSIS — E119 Type 2 diabetes mellitus without complications: Secondary | ICD-10-CM | POA: Diagnosis not present

## 2019-04-03 DIAGNOSIS — B961 Klebsiella pneumoniae [K. pneumoniae] as the cause of diseases classified elsewhere: Secondary | ICD-10-CM | POA: Diagnosis not present

## 2019-04-03 DIAGNOSIS — D649 Anemia, unspecified: Secondary | ICD-10-CM | POA: Diagnosis not present

## 2019-04-03 LAB — NICOTINE/COTININE METABOLITES
Cotinine: <1 ng/mL
Nicotine: <1 ng/mL

## 2019-04-03 NOTE — Progress Notes (Signed)
6/12 Pt aware

## 2019-04-06 DIAGNOSIS — D649 Anemia, unspecified: Secondary | ICD-10-CM | POA: Diagnosis not present

## 2019-04-06 DIAGNOSIS — J449 Chronic obstructive pulmonary disease, unspecified: Secondary | ICD-10-CM | POA: Diagnosis not present

## 2019-04-06 DIAGNOSIS — Z7689 Persons encountering health services in other specified circumstances: Secondary | ICD-10-CM | POA: Diagnosis not present

## 2019-04-06 DIAGNOSIS — K219 Gastro-esophageal reflux disease without esophagitis: Secondary | ICD-10-CM | POA: Diagnosis not present

## 2019-04-06 DIAGNOSIS — I110 Hypertensive heart disease with heart failure: Secondary | ICD-10-CM | POA: Diagnosis not present

## 2019-04-06 DIAGNOSIS — M1990 Unspecified osteoarthritis, unspecified site: Secondary | ICD-10-CM | POA: Diagnosis not present

## 2019-04-06 DIAGNOSIS — E119 Type 2 diabetes mellitus without complications: Secondary | ICD-10-CM | POA: Diagnosis not present

## 2019-04-06 DIAGNOSIS — I5032 Chronic diastolic (congestive) heart failure: Secondary | ICD-10-CM | POA: Diagnosis not present

## 2019-04-06 DIAGNOSIS — B961 Klebsiella pneumoniae [K. pneumoniae] as the cause of diseases classified elsewhere: Secondary | ICD-10-CM | POA: Diagnosis not present

## 2019-04-06 DIAGNOSIS — G894 Chronic pain syndrome: Secondary | ICD-10-CM | POA: Diagnosis not present

## 2019-04-06 DIAGNOSIS — N12 Tubulo-interstitial nephritis, not specified as acute or chronic: Secondary | ICD-10-CM | POA: Diagnosis not present

## 2019-04-07 ENCOUNTER — Encounter: Payer: Self-pay | Admitting: Student

## 2019-04-07 DIAGNOSIS — S72452K Displaced supracondylar fracture without intracondylar extension of lower end of left femur, subsequent encounter for closed fracture with nonunion: Secondary | ICD-10-CM | POA: Insufficient documentation

## 2019-04-07 DIAGNOSIS — S72002A Fracture of unspecified part of neck of left femur, initial encounter for closed fracture: Secondary | ICD-10-CM | POA: Diagnosis not present

## 2019-04-08 ENCOUNTER — Telehealth: Payer: Self-pay | Admitting: *Deleted

## 2019-04-08 DIAGNOSIS — E119 Type 2 diabetes mellitus without complications: Secondary | ICD-10-CM | POA: Diagnosis not present

## 2019-04-08 DIAGNOSIS — J449 Chronic obstructive pulmonary disease, unspecified: Secondary | ICD-10-CM | POA: Diagnosis not present

## 2019-04-08 DIAGNOSIS — G894 Chronic pain syndrome: Secondary | ICD-10-CM | POA: Diagnosis not present

## 2019-04-08 DIAGNOSIS — D649 Anemia, unspecified: Secondary | ICD-10-CM | POA: Diagnosis not present

## 2019-04-08 DIAGNOSIS — B961 Klebsiella pneumoniae [K. pneumoniae] as the cause of diseases classified elsewhere: Secondary | ICD-10-CM | POA: Diagnosis not present

## 2019-04-08 DIAGNOSIS — I5032 Chronic diastolic (congestive) heart failure: Secondary | ICD-10-CM | POA: Diagnosis not present

## 2019-04-08 DIAGNOSIS — K219 Gastro-esophageal reflux disease without esophagitis: Secondary | ICD-10-CM | POA: Diagnosis not present

## 2019-04-08 DIAGNOSIS — N12 Tubulo-interstitial nephritis, not specified as acute or chronic: Secondary | ICD-10-CM | POA: Diagnosis not present

## 2019-04-08 DIAGNOSIS — I110 Hypertensive heart disease with heart failure: Secondary | ICD-10-CM | POA: Diagnosis not present

## 2019-04-08 DIAGNOSIS — M1990 Unspecified osteoarthritis, unspecified site: Secondary | ICD-10-CM | POA: Diagnosis not present

## 2019-04-08 NOTE — Telephone Encounter (Signed)
Left message for [place] at [facility] to please draw a ferritin, transferrin, iron panel and repeat hgb in 1 week per Dr. [name] for Dx: Anemia Iron Deficiency. I ask that she call our office back if she has any questions.

## 2019-04-08 NOTE — Telephone Encounter (Signed)
Tillie Rung Physical therapist called stating that patient's pain is not controlled very well. Tillie Rung stated that they are not able to stretch her knee to do much PT because of the pain.  Tillie Rung stated that patient is very uncomfortable and the Tylenol #3 and Ibuprofen is not controlling her pain. Tillie Rung wants to know if Dr. [name] will prescribe something else to help control her pain so that she can be able to do PT?

## 2019-04-09 DIAGNOSIS — B961 Klebsiella pneumoniae [K. pneumoniae] as the cause of diseases classified elsewhere: Secondary | ICD-10-CM | POA: Diagnosis not present

## 2019-04-09 DIAGNOSIS — J449 Chronic obstructive pulmonary disease, unspecified: Secondary | ICD-10-CM | POA: Diagnosis not present

## 2019-04-09 DIAGNOSIS — N12 Tubulo-interstitial nephritis, not specified as acute or chronic: Secondary | ICD-10-CM | POA: Diagnosis not present

## 2019-04-09 DIAGNOSIS — G894 Chronic pain syndrome: Secondary | ICD-10-CM | POA: Diagnosis not present

## 2019-04-09 DIAGNOSIS — M1990 Unspecified osteoarthritis, unspecified site: Secondary | ICD-10-CM | POA: Diagnosis not present

## 2019-04-09 DIAGNOSIS — E119 Type 2 diabetes mellitus without complications: Secondary | ICD-10-CM | POA: Diagnosis not present

## 2019-04-09 DIAGNOSIS — I110 Hypertensive heart disease with heart failure: Secondary | ICD-10-CM | POA: Diagnosis not present

## 2019-04-09 DIAGNOSIS — D649 Anemia, unspecified: Secondary | ICD-10-CM | POA: Diagnosis not present

## 2019-04-09 DIAGNOSIS — I5032 Chronic diastolic (congestive) heart failure: Secondary | ICD-10-CM | POA: Diagnosis not present

## 2019-04-09 DIAGNOSIS — K219 Gastro-esophageal reflux disease without esophagitis: Secondary | ICD-10-CM | POA: Diagnosis not present

## 2019-04-10 DIAGNOSIS — I110 Hypertensive heart disease with heart failure: Secondary | ICD-10-CM | POA: Diagnosis not present

## 2019-04-10 DIAGNOSIS — G894 Chronic pain syndrome: Secondary | ICD-10-CM | POA: Diagnosis not present

## 2019-04-10 DIAGNOSIS — M1990 Unspecified osteoarthritis, unspecified site: Secondary | ICD-10-CM | POA: Diagnosis not present

## 2019-04-10 DIAGNOSIS — D649 Anemia, unspecified: Secondary | ICD-10-CM | POA: Diagnosis not present

## 2019-04-10 DIAGNOSIS — N12 Tubulo-interstitial nephritis, not specified as acute or chronic: Secondary | ICD-10-CM | POA: Diagnosis not present

## 2019-04-10 DIAGNOSIS — E119 Type 2 diabetes mellitus without complications: Secondary | ICD-10-CM | POA: Diagnosis not present

## 2019-04-10 DIAGNOSIS — B961 Klebsiella pneumoniae [K. pneumoniae] as the cause of diseases classified elsewhere: Secondary | ICD-10-CM | POA: Diagnosis not present

## 2019-04-10 DIAGNOSIS — I5032 Chronic diastolic (congestive) heart failure: Secondary | ICD-10-CM | POA: Diagnosis not present

## 2019-04-10 DIAGNOSIS — J449 Chronic obstructive pulmonary disease, unspecified: Secondary | ICD-10-CM | POA: Diagnosis not present

## 2019-04-10 DIAGNOSIS — K219 Gastro-esophageal reflux disease without esophagitis: Secondary | ICD-10-CM | POA: Diagnosis not present

## 2019-04-10 NOTE — Telephone Encounter (Signed)
She can take 2 tabs of  tylenol # 3 prior to physical therapy. If this is not enough... she will need to make pain management OV, as well as to set up UDS and contract curbside or through home health. Please let pt and Tillie Rung with PT know.

## 2019-04-10 NOTE — Telephone Encounter (Signed)
Tillie Rung notified as instructed by telephone.

## 2019-04-13 ENCOUNTER — Other Ambulatory Visit: Payer: Self-pay | Admitting: *Deleted

## 2019-04-13 DIAGNOSIS — I5032 Chronic diastolic (congestive) heart failure: Secondary | ICD-10-CM | POA: Diagnosis not present

## 2019-04-13 DIAGNOSIS — M1990 Unspecified osteoarthritis, unspecified site: Secondary | ICD-10-CM | POA: Diagnosis not present

## 2019-04-13 DIAGNOSIS — G894 Chronic pain syndrome: Secondary | ICD-10-CM | POA: Diagnosis not present

## 2019-04-13 DIAGNOSIS — I110 Hypertensive heart disease with heart failure: Secondary | ICD-10-CM | POA: Diagnosis not present

## 2019-04-13 DIAGNOSIS — D649 Anemia, unspecified: Secondary | ICD-10-CM | POA: Diagnosis not present

## 2019-04-13 DIAGNOSIS — E119 Type 2 diabetes mellitus without complications: Secondary | ICD-10-CM | POA: Diagnosis not present

## 2019-04-13 DIAGNOSIS — N12 Tubulo-interstitial nephritis, not specified as acute or chronic: Secondary | ICD-10-CM | POA: Diagnosis not present

## 2019-04-13 DIAGNOSIS — B961 Klebsiella pneumoniae [K. pneumoniae] as the cause of diseases classified elsewhere: Secondary | ICD-10-CM | POA: Diagnosis not present

## 2019-04-13 DIAGNOSIS — K219 Gastro-esophageal reflux disease without esophagitis: Secondary | ICD-10-CM | POA: Diagnosis not present

## 2019-04-13 DIAGNOSIS — J449 Chronic obstructive pulmonary disease, unspecified: Secondary | ICD-10-CM | POA: Diagnosis not present

## 2019-04-13 NOTE — Telephone Encounter (Signed)
Patient left a voicemail stating that she needs a refill on her Tylenol #3 as soon as you can get it done.  Last office visit 04/02/19 Last refill 04/02/19 #14 [facility]: [facility]

## 2019-04-14 MED ORDER — ACETAMINOPHEN-CODEINE #3 300-30 MG PO TABS: 1.0000 | ORAL_TABLET | Freq: Four times a day (QID) | ORAL | 0 refills | Status: DC | PRN

## 2019-04-14 NOTE — Telephone Encounter (Signed)
Patient called requesting a refill on her Tylenol # 3 to be done ASAP. Advised patient that script was sent to the pharmacy this morning and she verbalized understanding.

## 2019-04-15 ENCOUNTER — Other Ambulatory Visit: Payer: Self-pay | Admitting: Family Medicine

## 2019-04-15 NOTE — Telephone Encounter (Signed)
Last office visit 04/02/2019 for hospital follow up.  Last refilled 03/11/2019 for #120 with no refills.  Next appointment: 04/17/2019 to follow up on DM.

## 2019-04-16 DIAGNOSIS — I5032 Chronic diastolic (congestive) heart failure: Secondary | ICD-10-CM | POA: Diagnosis not present

## 2019-04-16 DIAGNOSIS — N12 Tubulo-interstitial nephritis, not specified as acute or chronic: Secondary | ICD-10-CM | POA: Diagnosis not present

## 2019-04-16 DIAGNOSIS — I110 Hypertensive heart disease with heart failure: Secondary | ICD-10-CM | POA: Diagnosis not present

## 2019-04-16 DIAGNOSIS — K219 Gastro-esophageal reflux disease without esophagitis: Secondary | ICD-10-CM | POA: Diagnosis not present

## 2019-04-16 DIAGNOSIS — M1990 Unspecified osteoarthritis, unspecified site: Secondary | ICD-10-CM | POA: Diagnosis not present

## 2019-04-16 DIAGNOSIS — B961 Klebsiella pneumoniae [K. pneumoniae] as the cause of diseases classified elsewhere: Secondary | ICD-10-CM | POA: Diagnosis not present

## 2019-04-16 DIAGNOSIS — E119 Type 2 diabetes mellitus without complications: Secondary | ICD-10-CM | POA: Diagnosis not present

## 2019-04-16 DIAGNOSIS — G894 Chronic pain syndrome: Secondary | ICD-10-CM | POA: Diagnosis not present

## 2019-04-16 DIAGNOSIS — J449 Chronic obstructive pulmonary disease, unspecified: Secondary | ICD-10-CM | POA: Diagnosis not present

## 2019-04-16 DIAGNOSIS — D649 Anemia, unspecified: Secondary | ICD-10-CM | POA: Diagnosis not present

## 2019-04-17 ENCOUNTER — Other Ambulatory Visit: Payer: Self-pay | Admitting: Family Medicine

## 2019-04-17 ENCOUNTER — Encounter: Payer: Self-pay | Admitting: Family Medicine

## 2019-04-17 ENCOUNTER — Ambulatory Visit (INDEPENDENT_AMBULATORY_CARE_PROVIDER_SITE_OTHER): Payer: Medicare Other | Admitting: Family Medicine

## 2019-04-17 DIAGNOSIS — Z1231 Encounter for screening mammogram for malignant neoplasm of breast: Secondary | ICD-10-CM

## 2019-04-17 DIAGNOSIS — E119 Type 2 diabetes mellitus without complications: Secondary | ICD-10-CM | POA: Diagnosis not present

## 2019-04-17 DIAGNOSIS — N390 Urinary tract infection, site not specified: Secondary | ICD-10-CM | POA: Diagnosis not present

## 2019-04-17 DIAGNOSIS — E1165 Type 2 diabetes mellitus with hyperglycemia: Secondary | ICD-10-CM | POA: Diagnosis not present

## 2019-04-17 DIAGNOSIS — I110 Hypertensive heart disease with heart failure: Secondary | ICD-10-CM | POA: Diagnosis not present

## 2019-04-17 DIAGNOSIS — G894 Chronic pain syndrome: Secondary | ICD-10-CM | POA: Diagnosis not present

## 2019-04-17 DIAGNOSIS — I5032 Chronic diastolic (congestive) heart failure: Secondary | ICD-10-CM | POA: Diagnosis not present

## 2019-04-17 DIAGNOSIS — M1990 Unspecified osteoarthritis, unspecified site: Secondary | ICD-10-CM | POA: Diagnosis not present

## 2019-04-17 DIAGNOSIS — D649 Anemia, unspecified: Secondary | ICD-10-CM | POA: Diagnosis not present

## 2019-04-17 DIAGNOSIS — Z7951 Long term (current) use of inhaled steroids: Secondary | ICD-10-CM | POA: Diagnosis not present

## 2019-04-17 DIAGNOSIS — K219 Gastro-esophageal reflux disease without esophagitis: Secondary | ICD-10-CM | POA: Diagnosis not present

## 2019-04-17 DIAGNOSIS — N12 Tubulo-interstitial nephritis, not specified as acute or chronic: Secondary | ICD-10-CM | POA: Diagnosis not present

## 2019-04-17 DIAGNOSIS — B961 Klebsiella pneumoniae [K. pneumoniae] as the cause of diseases classified elsewhere: Secondary | ICD-10-CM | POA: Diagnosis not present

## 2019-04-17 DIAGNOSIS — Z7689 Persons encountering health services in other specified circumstances: Secondary | ICD-10-CM | POA: Diagnosis not present

## 2019-04-17 DIAGNOSIS — J449 Chronic obstructive pulmonary disease, unspecified: Secondary | ICD-10-CM | POA: Diagnosis not present

## 2019-04-17 NOTE — Patient Instructions (Signed)
Stop pioglitazone.  Continue metformin. Make sure to take lantus every evening.  Follow blood sugars at home, fasting.Marland Kitchen goal < 120, after meals goal < 180.  Follow up in 2 weeks virtually.

## 2019-04-17 NOTE — Assessment & Plan Note (Signed)
Improved control but has lows and highs from using Lantus prn.  Will have her stop pioglitazone and take lantus 35 units and metformin REGULARLY.  Follow up in 2 weeks.

## 2019-04-17 NOTE — Progress Notes (Signed)
VIRTUAL VISIT Due to national recommendations of social distancing due to [place] 19, a virtual visit is felt to be most appropriate for this patient at this time.   I connected with the patient on 04/17/19 at  2:40 PM EDT by virtual telehealth platform and verified that I am speaking with the correct person using two identifiers.   Interactive audio and video telecommunications were attempted between this provider and patient, however failed, due to patient having technical difficulties OR patient did not have access to video capability.  We continued and completed visit with audio only.   I discussed the limitations, risks, security and privacy concerns of performing an evaluation and management service by  virtual telehealth platform and the availability of in person appointments. I also discussed with the patient that there may be a patient responsible charge related to this service. The patient expressed understanding and agreed to proceed.  Patient location: Home Provider Location: [place] Participants: Eliezer Lofts and [name]   Chief Complaint  Patient presents with  . Diabetes    History of Present Illness: 58 year old female presents for 2 week follow up DM.  At last OV  Continued to be compliant with lantus 35 units, pioglitazone and metformin. Using medications without difficulties: Not taking lantus ever night. Hypoglycemic episodes:none one measurement of 65 Hyperglycemic episodes: occ Feet problems:no ulcers Blood Sugars averaging: FBS now improving 99-143    COVID 19 screen No recent travel or known exposure to COVID19 The patient denies respiratory symptoms of COVID 19 at this time.  The importance of social distancing was discussed today.   Review of Systems  Constitutional: Negative for chills and fever.  HENT: Negative for congestion and ear pain.   Eyes: Negative for pain and redness.  Respiratory: Negative for cough and shortness of  breath.   Cardiovascular: Negative for chest pain, palpitations and leg swelling.  Gastrointestinal: Negative for abdominal pain, blood in stool, constipation, diarrhea, nausea and vomiting.  Genitourinary: Negative for dysuria.  Musculoskeletal: Negative for falls and myalgias.  Skin: Negative for rash.  Neurological: Negative for dizziness.  Psychiatric/Behavioral: Negative for depression. The patient is not nervous/anxious.       Past Medical History:  Diagnosis Date  . Allergic rhinitis, cause unspecified   . Anemia   . Anxiety state, unspecified   . Backache, unspecified   . Calculus of kidney   . Carpal tunnel syndrome, right   . CHF (congestive heart failure) ([place])    unspecified   . Chronic pain syndrome   . Constipation   . COPD (chronic obstructive pulmonary disease) ([place])   . Cough   . Depressive disorder, not elsewhere classified    managed with medications  . Difficulty in walking   . Displaced intertrochanteric fracture of left femur ([place])   . Esophageal reflux   . Extrinsic asthma with exacerbation   . Fibromyalgia   . Heart murmur   . Hyperlipidemia   . Hypertension   . Muscle weakness (generalized)   . Nicotine dependence, cigarettes, uncomplicated   . Osteoarthrosis, unspecified whether generalized or localized, unspecified site   . Other abnormalities of gait and mobility   . Other and unspecified hyperlipidemia   . Other irritable bowel syndrome   . Other screening mammogram   . Personal history of unspecified urinary disorder   . Pneumonia   . Pressure ulcer of sacral region   . Routine general medical examination at a health care facility   .  Routine gynecological examination   . Tobacco use disorder   . Type II or unspecified type diabetes mellitus without mention of complication, not stated as uncontrolled   . Unspecified fall, subsequent encounter   . Unspecified sleep apnea   . Urinary tract infection   . Vaginitis   . Wears glasses   .  Wheezing     reports that she has quit smoking. Her smoking use included cigarettes. She has a 35.00 pack-year smoking history. She has never used smokeless tobacco. She reports that she does not drink alcohol or use drugs.   Current Outpatient Medications:  .  ACCU-CHEK SOFTCLIX LANCETS lancets, CHECK BLOOD SUGAR TWICE DAILY AND AS DIRECTED, Disp: 100 each, Rfl: 11 .  acetaminophen (TYLENOL) 325 MG tablet, Take 650 mg by mouth every 6 (six) hours as needed for mild pain or headache., Disp: , Rfl:  .  acetaminophen-codeine (TYLENOL #3) 300-30 MG tablet, Take 1-2 tablets by mouth every 6 (six) hours as needed for moderate pain., Disp: 60 tablet, Rfl: 0 .  AIMSCO INSULIN SYR ULTRA THIN 31G X 5/16" 0.3 ML MISC, , Disp: , Rfl:  .  Alcohol Swabs PADS, Check blood sugar twice a day and as directed. Dx E11.9, Disp: 100 each, Rfl: 5 .  ALPRAZolam (XANAX) 0.5 MG tablet, TAKE 1 TABLET(0.5 MG) BY MOUTH THREE TIMES DAILY, Disp: 90 tablet, Rfl: 0 .  amitriptyline (ELAVIL) 75 MG tablet, Take 1 tablet (75 mg total) by mouth at bedtime., Disp: 10 tablet, Rfl: 0 .  B-D ULTRAFINE III SHORT PEN 31G X 8 MM MISC, USE AS DIRECTED WITH INSULIN PEN, Disp: 100 each, Rfl: 11 .  Blood Glucose Monitoring Suppl (ACCU-CHEK AVIVA PLUS) w/Device KIT, Check blood sugar twice daily and as directed., Disp: 1 kit, Rfl: 0 .  budesonide-formoterol (SYMBICORT) 160-4.5 MCG/ACT inhaler, INHALE 2 PUFFS INTO THE LUNGS TWICE DAILY, Disp: 10.2 g, Rfl: 2 .  feeding supplement, GLUCERNA SHAKE, (GLUCERNA SHAKE) LIQD, Take 237 mLs by mouth 2 (two) times daily between meals., Disp: 10 Can, Rfl: 0 .  furosemide (LASIX) 20 MG tablet, TAKE 1 TABLET(20 MG) BY MOUTH DAILY, Disp: 90 tablet, Rfl: 0 .  gabapentin (NEURONTIN) 300 MG capsule, TAKE ONE CAPSULE BY MOUTH IN THE MORNING, 1 CAPSULE IN THE AFTERNOON, AND 2 CAPSULES AT BEDTIME, Disp: 120 capsule, Rfl: 0 .  gemfibrozil (LOPID) 600 MG tablet, TAKE 1 TABLET BY MOUTH TWICE DAILY, Disp: 60 tablet, Rfl:  5 .  glucose blood (ACCU-CHEK AVIVA PLUS) test strip, TEST BLOOD SUGAR TWICE DAILY AND DIRECTED, Disp: 100 each, Rfl: 11 .  Insulin Glargine (LANTUS SOLOSTAR) 100 UNIT/ML Solostar Pen, ADMINISTER 35 UNITS UNDER THE SKIN AT BEDTIME, Disp: 15 mL, Rfl: 5 .  Lancet Devices (ADJUSTABLE LANCING DEVICE) MISC, Check blood sugar twice a day and as directed. Dx E11.9, Disp: 100 each, Rfl: 5 .  metFORMIN (GLUCOPHAGE) 1000 MG tablet, Take 1,000 mg by mouth 2 (two) times daily with a meal., Disp: , Rfl:  .  methocarbamol (ROBAXIN) 500 MG tablet, Take 2 tablets (1,000 mg total) by mouth at bedtime as needed for muscle spasms., Disp: 60 tablet, Rfl: 2 .  omeprazole (PRILOSEC OTC) 20 MG tablet, Take 20 mg by mouth daily. , Disp: , Rfl:  .  OVER THE COUNTER MEDICATION, Take 2 tablets by mouth daily. OTC 8 hour allergy pill, Disp: , Rfl:  .  pioglitazone (ACTOS) 45 MG tablet, TAKE 1 TABLET BY MOUTH EVERY DAY, Disp: 90 tablet, Rfl: 1 .  PROAIR HFA 108 (90 Base) MCG/ACT inhaler, TAKE 2 PUFFS BY MOUTH EVERY 4 HOURS AS NEEDED FOR WHEEZE OR FOR SHORTNESS OF BREATH, Disp: 8.5 Inhaler, Rfl: 5 .  Vitamin D, Ergocalciferol, (DRISDOL) 1.25 MG (50000 UT) CAPS capsule, Take 1 capsule (50,000 Units total) by mouth every 7 (seven) days., Disp: 5 capsule, Rfl: 0   Observations/Objective: There were no vitals taken for this visit.  Physical Exam  Physical Exam Constitutional:      General: The patient is not in acute distress. Pulmonary:     Effort: Pulmonary effort is normal. No respiratory distress.  Neurological:     Mental Status: The patient is alert and oriented to person, place, and time.  Psychiatric:        Mood and Affect: Mood normal.        Behavior: Behavior normal.   Assessment and Plan Diabetes mellitus with no complication (HCC) Improved control but has lows and highs from using Lantus prn.  Will have her stop pioglitazone and take lantus 35 units and metformin REGULARLY.  Follow up in 2 weeks.      I discussed the assessment and treatment plan with the patient. The patient was provided an opportunity to ask questions and all were answered. The patient agreed with the plan and demonstrated an understanding of the instructions.   The patient was advised to call back or seek an in-person evaluation if the symptoms worsen or if the condition fails to improve as anticipated.     Eliezer Lofts, MD

## 2019-04-17 NOTE — Progress Notes (Signed)
AVS mailed as instructed by Dr. [name].

## 2019-04-20 DIAGNOSIS — I5032 Chronic diastolic (congestive) heart failure: Secondary | ICD-10-CM | POA: Diagnosis not present

## 2019-04-20 DIAGNOSIS — K219 Gastro-esophageal reflux disease without esophagitis: Secondary | ICD-10-CM | POA: Diagnosis not present

## 2019-04-20 DIAGNOSIS — E119 Type 2 diabetes mellitus without complications: Secondary | ICD-10-CM | POA: Diagnosis not present

## 2019-04-20 DIAGNOSIS — M1990 Unspecified osteoarthritis, unspecified site: Secondary | ICD-10-CM | POA: Diagnosis not present

## 2019-04-20 DIAGNOSIS — D649 Anemia, unspecified: Secondary | ICD-10-CM | POA: Diagnosis not present

## 2019-04-20 DIAGNOSIS — G894 Chronic pain syndrome: Secondary | ICD-10-CM | POA: Diagnosis not present

## 2019-04-20 DIAGNOSIS — I110 Hypertensive heart disease with heart failure: Secondary | ICD-10-CM | POA: Diagnosis not present

## 2019-04-20 DIAGNOSIS — J449 Chronic obstructive pulmonary disease, unspecified: Secondary | ICD-10-CM | POA: Diagnosis not present

## 2019-04-20 DIAGNOSIS — B961 Klebsiella pneumoniae [K. pneumoniae] as the cause of diseases classified elsewhere: Secondary | ICD-10-CM | POA: Diagnosis not present

## 2019-04-20 DIAGNOSIS — N12 Tubulo-interstitial nephritis, not specified as acute or chronic: Secondary | ICD-10-CM | POA: Diagnosis not present

## 2019-04-21 ENCOUNTER — Telehealth: Payer: Self-pay | Admitting: Family Medicine

## 2019-04-21 NOTE — Telephone Encounter (Signed)
Please call pt's ORTHO: Dr. Lennette Bihari Haddix  Does she need to have DEXA now, can this wait, do they have ability to perform this at their office?  [facility] has no way to get her from wheelchair to table per their report.

## 2019-04-21 NOTE — Telephone Encounter (Signed)
Pt is scheduled [facility] for [facility] and 06-03-2019. Facility doesn't have a lift or additional personnel to move pt from wheelchair onto table w/o assistance or lift.  Pt is questioning whether she needs to have done now.

## 2019-04-21 NOTE — Telephone Encounter (Signed)
Dr. Doreatha Martin office to check w/him to see if pt needs DEXA now.  They don't do them in office.  [facility] doesn't have a lift either.  Pt would need to be scheduled at a hospital.

## 2019-04-23 ENCOUNTER — Telehealth: Payer: Self-pay | Admitting: Family Medicine

## 2019-04-23 NOTE — Telephone Encounter (Signed)
Copied from [place] [id number]. Topic: General - Other >> Apr 22, 2019  4:17 PM Rutherford Nail, NT wrote: Reason for CRM: Ria Comment with Dr Doreatha Martin office calling and state that the patient does not currently need DEXA scan.  CB#: [phone]

## 2019-04-23 NOTE — Telephone Encounter (Signed)
Please let pt know. Can cancel order.

## 2019-04-23 NOTE — Telephone Encounter (Signed)
Okay to given verbal order for continued home aide and PT as requested.

## 2019-04-23 NOTE — Telephone Encounter (Signed)
Patient called and requested more orders be put in for her to continue having an aid come and give her a bath.  Patient also mentioned she needed more orders placed for her to continue PT  Please advise   PATIENT'S PHONE - [phone]

## 2019-04-24 DIAGNOSIS — G894 Chronic pain syndrome: Secondary | ICD-10-CM | POA: Diagnosis not present

## 2019-04-24 DIAGNOSIS — I110 Hypertensive heart disease with heart failure: Secondary | ICD-10-CM | POA: Diagnosis not present

## 2019-04-24 DIAGNOSIS — B961 Klebsiella pneumoniae [K. pneumoniae] as the cause of diseases classified elsewhere: Secondary | ICD-10-CM | POA: Diagnosis not present

## 2019-04-24 DIAGNOSIS — N12 Tubulo-interstitial nephritis, not specified as acute or chronic: Secondary | ICD-10-CM | POA: Diagnosis not present

## 2019-04-24 DIAGNOSIS — D649 Anemia, unspecified: Secondary | ICD-10-CM | POA: Diagnosis not present

## 2019-04-24 DIAGNOSIS — I5032 Chronic diastolic (congestive) heart failure: Secondary | ICD-10-CM | POA: Diagnosis not present

## 2019-04-24 DIAGNOSIS — J449 Chronic obstructive pulmonary disease, unspecified: Secondary | ICD-10-CM | POA: Diagnosis not present

## 2019-04-24 DIAGNOSIS — E119 Type 2 diabetes mellitus without complications: Secondary | ICD-10-CM | POA: Diagnosis not present

## 2019-04-24 DIAGNOSIS — M1990 Unspecified osteoarthritis, unspecified site: Secondary | ICD-10-CM | POA: Diagnosis not present

## 2019-04-24 DIAGNOSIS — K219 Gastro-esophageal reflux disease without esophagitis: Secondary | ICD-10-CM | POA: Diagnosis not present

## 2019-04-24 NOTE — Telephone Encounter (Signed)
Pt aware.  Spoke w/husband.  DEXA and mammo canceled.  Will r/s both at a later date. Pt will need to have both done@hospital  w/lift when pt is recovered from knee issue

## 2019-04-24 NOTE — Telephone Encounter (Signed)
Left message for Sandra Ferrell to return call.  This is the patient requesting and not home health so I have trying to find out if they have discharged patient at this point because [facility] PT usually calls [place] to request verbal orders to extend PT.   Patient must have PT going out for treatment in order to have the aide to come out.  Will await Kendra's return call to find out what we need to do on our end to continue both services.

## 2019-04-24 NOTE — Progress Notes (Signed)
Appointment 6/30

## 2019-04-26 DIAGNOSIS — S72492S Other fracture of lower end of left femur, sequela: Secondary | ICD-10-CM | POA: Diagnosis not present

## 2019-04-26 DIAGNOSIS — S72142D Displaced intertrochanteric fracture of left femur, subsequent encounter for closed fracture with routine healing: Secondary | ICD-10-CM | POA: Diagnosis not present

## 2019-04-27 DIAGNOSIS — N12 Tubulo-interstitial nephritis, not specified as acute or chronic: Secondary | ICD-10-CM | POA: Diagnosis not present

## 2019-04-27 DIAGNOSIS — I110 Hypertensive heart disease with heart failure: Secondary | ICD-10-CM | POA: Diagnosis not present

## 2019-04-27 DIAGNOSIS — M1990 Unspecified osteoarthritis, unspecified site: Secondary | ICD-10-CM | POA: Diagnosis not present

## 2019-04-27 DIAGNOSIS — K219 Gastro-esophageal reflux disease without esophagitis: Secondary | ICD-10-CM | POA: Diagnosis not present

## 2019-04-27 DIAGNOSIS — B961 Klebsiella pneumoniae [K. pneumoniae] as the cause of diseases classified elsewhere: Secondary | ICD-10-CM | POA: Diagnosis not present

## 2019-04-27 DIAGNOSIS — G894 Chronic pain syndrome: Secondary | ICD-10-CM | POA: Diagnosis not present

## 2019-04-27 DIAGNOSIS — E119 Type 2 diabetes mellitus without complications: Secondary | ICD-10-CM | POA: Diagnosis not present

## 2019-04-27 DIAGNOSIS — I5032 Chronic diastolic (congestive) heart failure: Secondary | ICD-10-CM | POA: Diagnosis not present

## 2019-04-27 DIAGNOSIS — D649 Anemia, unspecified: Secondary | ICD-10-CM | POA: Diagnosis not present

## 2019-04-27 DIAGNOSIS — J449 Chronic obstructive pulmonary disease, unspecified: Secondary | ICD-10-CM | POA: Diagnosis not present

## 2019-04-27 NOTE — Telephone Encounter (Signed)
Left message at [facility] [phone]) to return call in regards to Sandra Ferrell.

## 2019-04-28 DIAGNOSIS — N12 Tubulo-interstitial nephritis, not specified as acute or chronic: Secondary | ICD-10-CM | POA: Diagnosis not present

## 2019-04-28 DIAGNOSIS — G894 Chronic pain syndrome: Secondary | ICD-10-CM | POA: Diagnosis not present

## 2019-04-28 DIAGNOSIS — B961 Klebsiella pneumoniae [K. pneumoniae] as the cause of diseases classified elsewhere: Secondary | ICD-10-CM | POA: Diagnosis not present

## 2019-04-28 DIAGNOSIS — K219 Gastro-esophageal reflux disease without esophagitis: Secondary | ICD-10-CM | POA: Diagnosis not present

## 2019-04-28 DIAGNOSIS — I5032 Chronic diastolic (congestive) heart failure: Secondary | ICD-10-CM | POA: Diagnosis not present

## 2019-04-28 DIAGNOSIS — E119 Type 2 diabetes mellitus without complications: Secondary | ICD-10-CM | POA: Diagnosis not present

## 2019-04-28 DIAGNOSIS — J449 Chronic obstructive pulmonary disease, unspecified: Secondary | ICD-10-CM | POA: Diagnosis not present

## 2019-04-28 DIAGNOSIS — D649 Anemia, unspecified: Secondary | ICD-10-CM | POA: Diagnosis not present

## 2019-04-28 DIAGNOSIS — I110 Hypertensive heart disease with heart failure: Secondary | ICD-10-CM | POA: Diagnosis not present

## 2019-04-28 DIAGNOSIS — M1990 Unspecified osteoarthritis, unspecified site: Secondary | ICD-10-CM | POA: Diagnosis not present

## 2019-04-28 NOTE — Telephone Encounter (Signed)
Left message for Sandra Ferrell, Publishing copy with [facility] giving verbal orders to continue PT and home health aide per Dr. [name].

## 2019-04-30 DIAGNOSIS — K219 Gastro-esophageal reflux disease without esophagitis: Secondary | ICD-10-CM | POA: Diagnosis not present

## 2019-04-30 DIAGNOSIS — N12 Tubulo-interstitial nephritis, not specified as acute or chronic: Secondary | ICD-10-CM | POA: Diagnosis not present

## 2019-04-30 DIAGNOSIS — I110 Hypertensive heart disease with heart failure: Secondary | ICD-10-CM | POA: Diagnosis not present

## 2019-04-30 DIAGNOSIS — I5032 Chronic diastolic (congestive) heart failure: Secondary | ICD-10-CM | POA: Diagnosis not present

## 2019-04-30 DIAGNOSIS — E119 Type 2 diabetes mellitus without complications: Secondary | ICD-10-CM | POA: Diagnosis not present

## 2019-04-30 DIAGNOSIS — M1990 Unspecified osteoarthritis, unspecified site: Secondary | ICD-10-CM | POA: Diagnosis not present

## 2019-04-30 DIAGNOSIS — B961 Klebsiella pneumoniae [K. pneumoniae] as the cause of diseases classified elsewhere: Secondary | ICD-10-CM | POA: Diagnosis not present

## 2019-04-30 DIAGNOSIS — J449 Chronic obstructive pulmonary disease, unspecified: Secondary | ICD-10-CM | POA: Diagnosis not present

## 2019-04-30 DIAGNOSIS — G894 Chronic pain syndrome: Secondary | ICD-10-CM | POA: Diagnosis not present

## 2019-04-30 DIAGNOSIS — D649 Anemia, unspecified: Secondary | ICD-10-CM | POA: Diagnosis not present

## 2019-05-03 ENCOUNTER — Other Ambulatory Visit: Payer: Self-pay | Admitting: Family Medicine

## 2019-05-04 DIAGNOSIS — I5032 Chronic diastolic (congestive) heart failure: Secondary | ICD-10-CM | POA: Diagnosis not present

## 2019-05-04 DIAGNOSIS — M1990 Unspecified osteoarthritis, unspecified site: Secondary | ICD-10-CM | POA: Diagnosis not present

## 2019-05-04 DIAGNOSIS — D649 Anemia, unspecified: Secondary | ICD-10-CM | POA: Diagnosis not present

## 2019-05-04 DIAGNOSIS — B961 Klebsiella pneumoniae [K. pneumoniae] as the cause of diseases classified elsewhere: Secondary | ICD-10-CM | POA: Diagnosis not present

## 2019-05-04 DIAGNOSIS — E119 Type 2 diabetes mellitus without complications: Secondary | ICD-10-CM | POA: Diagnosis not present

## 2019-05-04 DIAGNOSIS — J449 Chronic obstructive pulmonary disease, unspecified: Secondary | ICD-10-CM | POA: Diagnosis not present

## 2019-05-04 DIAGNOSIS — N12 Tubulo-interstitial nephritis, not specified as acute or chronic: Secondary | ICD-10-CM | POA: Diagnosis not present

## 2019-05-04 DIAGNOSIS — G894 Chronic pain syndrome: Secondary | ICD-10-CM | POA: Diagnosis not present

## 2019-05-04 DIAGNOSIS — K219 Gastro-esophageal reflux disease without esophagitis: Secondary | ICD-10-CM | POA: Diagnosis not present

## 2019-05-04 DIAGNOSIS — I110 Hypertensive heart disease with heart failure: Secondary | ICD-10-CM | POA: Diagnosis not present

## 2019-05-04 NOTE — Telephone Encounter (Signed)
Last office visit 04/17/2019 for DM.  Last refilled 03/18/2019 for #90 with no refills.  No future appointments.

## 2019-05-05 ENCOUNTER — Ambulatory Visit: Payer: Medicare Other | Admitting: Family Medicine

## 2019-05-12 DIAGNOSIS — S72452K Displaced supracondylar fracture without intracondylar extension of lower end of left femur, subsequent encounter for closed fracture with nonunion: Secondary | ICD-10-CM | POA: Diagnosis not present

## 2019-05-13 ENCOUNTER — Other Ambulatory Visit: Payer: Self-pay | Admitting: Family Medicine

## 2019-05-13 ENCOUNTER — Other Ambulatory Visit: Payer: Medicare Other

## 2019-05-13 NOTE — Telephone Encounter (Signed)
Last office visit 04/17/2019 for DM.  Last refilled 04/16/2019 for #120 with no refills.  No future appointments.

## 2019-05-22 ENCOUNTER — Other Ambulatory Visit: Payer: Self-pay | Admitting: Family Medicine

## 2019-05-22 DIAGNOSIS — N390 Urinary tract infection, site not specified: Secondary | ICD-10-CM | POA: Diagnosis not present

## 2019-05-22 DIAGNOSIS — I110 Hypertensive heart disease with heart failure: Secondary | ICD-10-CM | POA: Diagnosis not present

## 2019-05-22 DIAGNOSIS — I5032 Chronic diastolic (congestive) heart failure: Secondary | ICD-10-CM | POA: Diagnosis not present

## 2019-05-22 DIAGNOSIS — J441 Chronic obstructive pulmonary disease with (acute) exacerbation: Secondary | ICD-10-CM

## 2019-05-22 DIAGNOSIS — E1165 Type 2 diabetes mellitus with hyperglycemia: Secondary | ICD-10-CM | POA: Diagnosis not present

## 2019-05-24 ENCOUNTER — Other Ambulatory Visit: Payer: Self-pay | Admitting: Family Medicine

## 2019-05-25 ENCOUNTER — Telehealth: Payer: Self-pay

## 2019-05-25 NOTE — Telephone Encounter (Signed)
Spoke with patient about this refill request. Last time it was filled showed August 2019. Patient states she was not taking this medication and was taking Tylenol PM but when she was at the hospital in May she was told to stop Tylenol PM so she restarted Amitriptyline instead and was taking what she had left over. She is out of this medication now and is requesting a refill.

## 2019-05-25 NOTE — Telephone Encounter (Signed)
Pt is going to have surgery again on lt femur due to rods and screws slipping and will put plate and do a bone graft. Pt to have surgery on 05/29/19 by Dr Katha Hamming who is an ortho trauma specialist; pt wanted Dr [name] to have Dr Haddix's contact # [phone]. Wanted Dr [name] to know. FYI to Dr [name].

## 2019-05-26 DIAGNOSIS — S72142D Displaced intertrochanteric fracture of left femur, subsequent encounter for closed fracture with routine healing: Secondary | ICD-10-CM | POA: Diagnosis not present

## 2019-05-26 DIAGNOSIS — S72492S Other fracture of lower end of left femur, sequela: Secondary | ICD-10-CM | POA: Diagnosis not present

## 2019-05-26 NOTE — Telephone Encounter (Signed)
Noted  

## 2019-05-28 ENCOUNTER — Encounter (HOSPITAL_COMMUNITY): Payer: Self-pay | Admitting: *Deleted

## 2019-05-28 ENCOUNTER — Other Ambulatory Visit: Payer: Self-pay

## 2019-05-28 NOTE — Progress Notes (Signed)
Anesthesia Chart Review: Sandra Ferrell   Case: [id number] Date/Time: 05/29/19 0715   Procedures:      REPAIR OF NONUNION FEMUR (Left )     HARDWARE REMOVAL LEFT FEMUR (Left )   Anesthesia type: General   Pre-op diagnosis: NON UNION LEFT FEMUR   Location: [facility] OR ROOM 03 / [place] [facility]   Surgeon: Shona Needles, MD      DISCUSSION: Patient is a 58 year old female scheduled for the above procedure. She fell and sustained a left distal femur fracture s/p ORIF with retrograde IM nail 06/29/18, s/p removal of hardware from left knee 08/20/18. 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 In May A1c 13.7 (up from 8.0% 12/25/18), so Ortho wanted her to work on DM control. She has seen her PCP since with DM regimen adjustments, although A1c has not been rechecked. Patient reports fasting CBGs currently running ~ 140-165.  History includes former smoker (quit 06/05/18), asthma, COPD, OSA (without CPAP use), DM2, GERD, IBS, murmur (trivial MR/TR '12), HTN, HLD, fibromyalgia, depression, anxiety, neck surgery (C5-7 ACDF 3//16/16; C4-5 ACDF 06/29/17), left L4-5 fusion 04/18/16. BMI is consistent with morbid obesity.   She is for labs and presurgical COVID-19 test on the day of surgery. She reportedly is having to arrive at the hospital by [facility] "ambulance."   PROVIDERS: Jinny Sanders, MD is PCP. Patient notified her of surgery plans. Had televisit 04/17/19. Fastomg blood sugars running ~ 99-143 at that time. Patient was not always compliant with night time Lantus. Rec: "Will have her stop pioglitazone and take lantus 35 units and metformin REGULARLY" - Patient is not routinely followed by cardiology, but did see Dr. Johnsie Cancel in 2012 for atypical chest pain and had a normal stress and echo.   LABS: She is for labs on arrival day of surgery.    IMAGES: CT left femur 03/25/19: IMPRESSION: 1. Comminuted ununited distal femoral diametaphyseal  fracture transfixed with a intramedullary nail and interlocking screws without significant bridging callus formation. Lucency around the tip of the distal 3 interlocking screws most concerning for loosening. Lucency around the intramedullary nail along the distal metaphysis also concerning for loosening.  CXR 12/24/18: IMPRESSION: No acute cardiopulmonary abnormality.   EKG: 12/26/18: Normal sinus rhythm Low voltage QRS Borderline ECG Since last tracing rate slower Confirmed by Cristopher Peru [phone]) on 12/26/2018 8:30:05 PM   CV: Echo 03/01/11:  Study Conclusions: - Left ventricle: The cavity size was normal. Wall thickness was increased in a pattern of mild LVH. Systolic function was normal. The estimated ejection fraction was in the range of 60% to 65%. Wall motion was normal; there were no regional wall motion abnormalities. Doppler parameters are consistent with abnormal left ventricular relaxation (grade 1 diastolic dysfunction). - Left atrium: The atrium was mildly dilated. - Atrial septum: No defect or patent foramen ovale was identified. - Trivial mitral and tricuspid regurgitation.  Nuclear stress test 03/01/11:  Overall Impression: Normal stress nuclear study. No evidence of ischemia. Normal LV function. EF 80%.    Past Medical History:  Diagnosis Date  . Allergic rhinitis, cause unspecified   . Anemia   . Anxiety state, unspecified   . Backache, unspecified   . Carpal tunnel syndrome, right    Bilateral  . CHF (congestive heart failure) ([place])    unspecified , patient denies  . Chronic pain syndrome   . Constipation   . COPD (chronic obstructive pulmonary disease) ([place])   .  Cough   . Depressive disorder, not elsewhere classified    managed with medications  . Difficulty in walking   . Displaced intertrochanteric fracture of left femur ([place])   . Esophageal reflux   . Extrinsic asthma with exacerbation   . Fibromyalgia   . Heart murmur     "a small one"  . History of kidney stones   . Hyperlipidemia   . Hypertension   . Muscle weakness (generalized)   . Nicotine dependence, cigarettes, uncomplicated   . Osteoarthrosis, unspecified whether generalized or localized, unspecified site   . Other abnormalities of gait and mobility   . Other and unspecified hyperlipidemia   . Other irritable bowel syndrome   . Other screening mammogram   . Personal history of unspecified urinary disorder   . Pneumonia   . Pressure ulcer of sacral region   . Routine general medical examination at a health care facility   . Routine gynecological examination   . Tobacco use disorder   . Type II or unspecified type diabetes mellitus without mention of complication, not stated as uncontrolled    Type II  . Unspecified fall, subsequent encounter   . Unspecified sleep apnea   . Urinary tract infection   . Vaginitis   . Wears glasses   . Wheezing     Past Surgical History:  Procedure Laterality Date  . ANTERIOR CERVICAL DECOMP/DISCECTOMY FUSION N/A 01/19/2015   Procedure: ANTERIOR CERVICAL DECOMPRESSION/DISCECTOMY FUSION 2 LEVELS;  Surgeon: Phylliss Bob, MD;  Location: [place];  Service: Orthopedics;  Laterality: N/A;  Anterior cervical decompression fusion, cervical 5-6, cervical 6-7 with instrumentation and allograft  . ANTERIOR CERVICAL DECOMP/DISCECTOMY FUSION N/A 05/23/2017   Procedure: ANTERIOR CERVICAL DECOMPRESSION FUSION, CERVICAL 4-5 WITH INSTRUMENTATION AND ALLOGRAFT;  Surgeon: Phylliss Bob, MD;  Location: [place];  Service: Orthopedics;  Laterality: N/A;  ANTERIOR CERVICAL DECOMPRESSION FUSION, CERVICAL 4-5 WITH INSTRUMENTATION AND ALLOGRAFT; REQUEST 2 HOURS AND FLIP ROOM  . APPENDECTOMY  1973  . BACK SURGERY     Lumbar- "bone graft"  . CHOLECYSTECTOMY  08/2006  . COLONOSCOPY    . HARDWARE REMOVAL Left 08/20/2018   Procedure: HARDWARE REMOVAL FROM LEFT KNEE;  Surgeon: Milly Jakob, MD;  Location: [facility];  Service: Orthopedics;   Laterality: Left;  . ORIF FEMUR FRACTURE Left 06/29/2018   Procedure: OPEN REDUCTION INTERNAL FIXATION (ORIF) DISTAL FEMUR FRACTURE;  Surgeon: Milly Jakob, MD;  Location: [place];  Service: Orthopedics;  Laterality: Left;  . TRANSTHORACIC ECHOCARDIOGRAM  02/2011   mild LVH, nl EF, mild diastolic dysfunction, no wall motion abnl    MEDICATIONS: No current facility-administered medications for this encounter.    Marland Kitchen acetaminophen (TYLENOL) 650 MG CR tablet  . acetaminophen-codeine (TYLENOL #3) 300-30 MG tablet  . ALPRAZolam (XANAX) 0.5 MG tablet  . amitriptyline (ELAVIL) 75 MG tablet  . budesonide-formoterol (SYMBICORT) 160-4.5 MCG/ACT inhaler  . chlorpheniramine (CHLOR-TRIMETON) 4 MG tablet  . feeding supplement, [place], ([place]) LIQD  . furosemide (LASIX) 20 MG tablet  . gabapentin (NEURONTIN) 300 MG capsule  . gemfibrozil (LOPID) 600 MG tablet  . Insulin Glargine (LANTUS SOLOSTAR) 100 UNIT/ML Solostar Pen  . Menthol-Methyl Salicylate (SALONPAS PAIN RELIEF PATCH) PTCH  . metFORMIN (GLUCOPHAGE) 1000 MG tablet  . methocarbamol (ROBAXIN) 500 MG tablet  . omeprazole (PRILOSEC OTC) 20 MG tablet  . OVER THE COUNTER MEDICATION  . pioglitazone (ACTOS) 45 MG tablet  . PROAIR HFA 108 (90 Base) MCG/ACT inhaler  . Vitamin D, Ergocalciferol, (DRISDOL) 1.25 MG (50000  UT) CAPS capsule  . ACCU-CHEK SOFTCLIX LANCETS lancets  . AIMSCO INSULIN SYR ULTRA THIN 31G X 5/16" 0.3 ML MISC  . Alcohol Swabs PADS  . B-D ULTRAFINE III SHORT PEN 31G X 8 MM MISC  . Blood Glucose Monitoring Suppl (ACCU-CHEK AVIVA PLUS) w/Device KIT  . glucose blood (ACCU-CHEK AVIVA PLUS) test strip  . Lancet Devices (ADJUSTABLE LANCING DEVICE) MISC    Myra Gianotti, PA-C Surgical Short Stay/Anesthesiology [facility] Phone [phone] [facility] Phone [phone] 05/28/2019 2:28 PM

## 2019-05-28 NOTE — Anesthesia Preprocedure Evaluation (Addendum)
Anesthesia Evaluation  Patient identified by MRN, date of birth, ID band Patient awake    Reviewed: Allergy & Precautions, NPO status , Patient's Chart, lab work & pertinent test results  History of Anesthesia Complications Negative for: history of anesthetic complications  Airway Mallampati: II  TM Distance: >3 FB Neck ROM: Full    Dental  (+) Edentulous Upper   Pulmonary asthma , sleep apnea , COPD,  COPD inhaler, former smoker,    breath sounds clear to auscultation       Cardiovascular hypertension, +CHF   Rhythm:Regular Rate:Normal     Neuro/Psych PSYCHIATRIC DISORDERS Anxiety Depression negative neurological ROS     GI/Hepatic Neg liver ROS, GERD  Medicated and Controlled,  Endo/Other  diabetes, Type 2, Insulin Dependent, Oral Hypoglycemic AgentsMorbid obesity  Renal/GU Renal disease     Musculoskeletal  (+) Arthritis , Fibromyalgia -  Abdominal   Peds  Hematology negative hematology ROS (+)   Anesthesia Other Findings   Reproductive/Obstetrics                          Anesthesia Physical Anesthesia Plan  ASA: III  Anesthesia Plan: General   Post-op Pain Management:    Induction: Intravenous  PONV Risk Score and Plan: 3 and Treatment may vary due to age or medical condition, Ondansetron, Dexamethasone and Midazolam  Airway Management Planned: Oral ETT  Additional Equipment: None  Intra-op Plan:   Post-operative Plan: Extubation in OR  Informed Consent: I have reviewed the patients History and Physical, chart, labs and discussed the procedure including the risks, benefits and alternatives for the proposed anesthesia with the patient or authorized representative who has indicated his/her understanding and acceptance.     Dental advisory given  Plan Discussed with: CRNA and Anesthesiologist  Anesthesia Plan Comments:     Anesthesia Quick Evaluation

## 2019-05-28 NOTE — Progress Notes (Addendum)
Mrs Hodapp denies chest pain or shortness of breath. Patient has type II diabetes.  Last A1C was 13.7- 03/25/2019.  Ms Barraclough reports that she she has been home from the hospital that [place] run 140- 165.  Patient takes Lantus at HS if CBG is > 250, "If I take it all the time my blood sugar drops in the 50's." I instructed to take 1/2 of Lantus dose if needed tonight.  I instructed patient to not take any diabetic medications in am. I instructed patient to check CBG after awaking and every 2 hours until arrival  to the hospital.  I Instructed patient if CBG is less than 70 to drink  1/2 cup of a clear juice. Recheck CBG in 15 minutes then call pre- op desk at [phone] for further instructions.  PCP is Dr Camillia Herter.  Patient will arrive via [facility] in am, she will have COVID test in am.  I asked ansesthesiology PA- C to review.

## 2019-05-28 NOTE — Telephone Encounter (Signed)
Pt called and left a message on triage line stating she is supposed to have the surgery tomorrow and has not heard from anyone at the hospital about what she should be doing and for Covid testing.   I called the pt back and advised her she needed to call the surgeons office to let them know no one has contacted her.  She, again, asked that we let Dr [name] know she is having surgery/ As stated in previous response, Dr [name] is aware.

## 2019-05-29 ENCOUNTER — Inpatient Hospital Stay (HOSPITAL_COMMUNITY): Payer: Medicare Other | Admitting: Vascular Surgery

## 2019-05-29 ENCOUNTER — Inpatient Hospital Stay (HOSPITAL_COMMUNITY): Payer: Medicare Other

## 2019-05-29 ENCOUNTER — Inpatient Hospital Stay (HOSPITAL_COMMUNITY)
Admission: RE | Admit: 2019-05-29 | Discharge: 2019-06-03 | DRG: 479 | Disposition: A | Discharge status: Home Health | Payer: Medicare Other | Attending: Student | Admitting: Student

## 2019-05-29 ENCOUNTER — Encounter (HOSPITAL_COMMUNITY)
Admission: RE | Disposition: A | Discharge status: Home Health | Payer: Self-pay | Source: Home / Self Care | Attending: Student

## 2019-05-29 DIAGNOSIS — Z800 Family history of malignant neoplasm of digestive organs: Secondary | ICD-10-CM

## 2019-05-29 DIAGNOSIS — K219 Gastro-esophageal reflux disease without esophagitis: Secondary | ICD-10-CM | POA: Diagnosis not present

## 2019-05-29 DIAGNOSIS — R5381 Other malaise: Secondary | ICD-10-CM | POA: Diagnosis not present

## 2019-05-29 DIAGNOSIS — J449 Chronic obstructive pulmonary disease, unspecified: Secondary | ICD-10-CM | POA: Diagnosis present

## 2019-05-29 DIAGNOSIS — E559 Vitamin D deficiency, unspecified: Secondary | ICD-10-CM | POA: Diagnosis not present

## 2019-05-29 DIAGNOSIS — M2550 Pain in unspecified joint: Secondary | ICD-10-CM | POA: Diagnosis not present

## 2019-05-29 DIAGNOSIS — W1830XD Fall on same level, unspecified, subsequent encounter: Secondary | ICD-10-CM

## 2019-05-29 DIAGNOSIS — Z23 Encounter for immunization: Secondary | ICD-10-CM | POA: Diagnosis not present

## 2019-05-29 DIAGNOSIS — Z885 Allergy status to narcotic agent status: Secondary | ICD-10-CM

## 2019-05-29 DIAGNOSIS — S7292XK Unspecified fracture of left femur, subsequent encounter for closed fracture with nonunion: Secondary | ICD-10-CM | POA: Diagnosis not present

## 2019-05-29 DIAGNOSIS — I509 Heart failure, unspecified: Secondary | ICD-10-CM | POA: Diagnosis present

## 2019-05-29 DIAGNOSIS — E785 Hyperlipidemia, unspecified: Secondary | ICD-10-CM | POA: Diagnosis present

## 2019-05-29 DIAGNOSIS — R062 Wheezing: Secondary | ICD-10-CM | POA: Diagnosis not present

## 2019-05-29 DIAGNOSIS — M797 Fibromyalgia: Secondary | ICD-10-CM | POA: Diagnosis not present

## 2019-05-29 DIAGNOSIS — E1165 Type 2 diabetes mellitus with hyperglycemia: Secondary | ICD-10-CM | POA: Diagnosis not present

## 2019-05-29 DIAGNOSIS — I110 Hypertensive heart disease with heart failure: Secondary | ICD-10-CM | POA: Diagnosis present

## 2019-05-29 DIAGNOSIS — S72302D Unspecified fracture of shaft of left femur, subsequent encounter for closed fracture with routine healing: Secondary | ICD-10-CM | POA: Diagnosis not present

## 2019-05-29 DIAGNOSIS — Z794 Long term (current) use of insulin: Secondary | ICD-10-CM

## 2019-05-29 DIAGNOSIS — Z880 Allergy status to penicillin: Secondary | ICD-10-CM

## 2019-05-29 DIAGNOSIS — Z87891 Personal history of nicotine dependence: Secondary | ICD-10-CM

## 2019-05-29 DIAGNOSIS — F329 Major depressive disorder, single episode, unspecified: Secondary | ICD-10-CM | POA: Diagnosis present

## 2019-05-29 DIAGNOSIS — Z20828 Contact with and (suspected) exposure to other viral communicable diseases: Secondary | ICD-10-CM | POA: Diagnosis present

## 2019-05-29 DIAGNOSIS — Z9119 Patient's noncompliance with other medical treatment and regimen: Secondary | ICD-10-CM

## 2019-05-29 DIAGNOSIS — Z419 Encounter for procedure for purposes other than remedying health state, unspecified: Secondary | ICD-10-CM

## 2019-05-29 DIAGNOSIS — Z981 Arthrodesis status: Secondary | ICD-10-CM

## 2019-05-29 DIAGNOSIS — I10 Essential (primary) hypertension: Secondary | ICD-10-CM | POA: Diagnosis not present

## 2019-05-29 DIAGNOSIS — G4730 Sleep apnea, unspecified: Secondary | ICD-10-CM | POA: Diagnosis present

## 2019-05-29 DIAGNOSIS — S72452K Displaced supracondylar fracture without intracondylar extension of lower end of left femur, subsequent encounter for closed fracture with nonunion: Secondary | ICD-10-CM | POA: Diagnosis not present

## 2019-05-29 DIAGNOSIS — F411 Generalized anxiety disorder: Secondary | ICD-10-CM | POA: Diagnosis present

## 2019-05-29 DIAGNOSIS — Z6839 Body mass index (BMI) 39.0-39.9, adult: Secondary | ICD-10-CM

## 2019-05-29 DIAGNOSIS — Z87442 Personal history of urinary calculi: Secondary | ICD-10-CM

## 2019-05-29 DIAGNOSIS — K588 Other irritable bowel syndrome: Secondary | ICD-10-CM | POA: Diagnosis present

## 2019-05-29 DIAGNOSIS — T1490XA Injury, unspecified, initial encounter: Secondary | ICD-10-CM

## 2019-05-29 DIAGNOSIS — G894 Chronic pain syndrome: Secondary | ICD-10-CM | POA: Diagnosis not present

## 2019-05-29 DIAGNOSIS — Z886 Allergy status to analgesic agent status: Secondary | ICD-10-CM

## 2019-05-29 DIAGNOSIS — Z7951 Long term (current) use of inhaled steroids: Secondary | ICD-10-CM

## 2019-05-29 DIAGNOSIS — Z823 Family history of stroke: Secondary | ICD-10-CM

## 2019-05-29 DIAGNOSIS — Z7401 Bed confinement status: Secondary | ICD-10-CM | POA: Diagnosis not present

## 2019-05-29 DIAGNOSIS — G9341 Metabolic encephalopathy: Secondary | ICD-10-CM | POA: Diagnosis not present

## 2019-05-29 DIAGNOSIS — Z87448 Personal history of other diseases of urinary system: Secondary | ICD-10-CM | POA: Diagnosis not present

## 2019-05-29 DIAGNOSIS — S72402K Unspecified fracture of lower end of left femur, subsequent encounter for closed fracture with nonunion: Principal | ICD-10-CM

## 2019-05-29 DIAGNOSIS — T84195A Other mechanical complication of internal fixation device of left femur, initial encounter: Secondary | ICD-10-CM | POA: Diagnosis not present

## 2019-05-29 DIAGNOSIS — Z791 Long term (current) use of non-steroidal anti-inflammatories (NSAID): Secondary | ICD-10-CM

## 2019-05-29 DIAGNOSIS — S72402A Unspecified fracture of lower end of left femur, initial encounter for closed fracture: Secondary | ICD-10-CM | POA: Diagnosis not present

## 2019-05-29 DIAGNOSIS — Z9049 Acquired absence of other specified parts of digestive tract: Secondary | ICD-10-CM

## 2019-05-29 DIAGNOSIS — M24562 Contracture, left knee: Secondary | ICD-10-CM | POA: Diagnosis present

## 2019-05-29 DIAGNOSIS — R5082 Postprocedural fever: Secondary | ICD-10-CM | POA: Diagnosis not present

## 2019-05-29 DIAGNOSIS — Z79899 Other long term (current) drug therapy: Secondary | ICD-10-CM

## 2019-05-29 DIAGNOSIS — Z888 Allergy status to other drugs, medicaments and biological substances status: Secondary | ICD-10-CM

## 2019-05-29 HISTORY — DX: Personal history of urinary calculi: Z87.442

## 2019-05-29 HISTORY — PX: HARDWARE REMOVAL: SHX979

## 2019-05-29 HISTORY — PX: ORIF FEMUR FRACTURE: SHX2119

## 2019-05-29 LAB — SARS CORONAVIRUS 2 BY RT PCR (HOSPITAL ORDER, PERFORMED IN ~~LOC~~ HOSPITAL LAB): SARS Coronavirus 2: NEGATIVE

## 2019-05-29 LAB — CBC
HCT: 28.8 % — ABNORMAL LOW (ref 36.0–46.0)
Hemoglobin: 8.7 g/dL — ABNORMAL LOW (ref 12.0–15.0)
MCH: 27.1 pg (ref 26.0–34.0)
MCHC: 30.2 g/dL (ref 30.0–36.0)
MCV: 89.7 fL (ref 80.0–100.0)
Platelets: 377 10*3/uL (ref 150–400)
RBC: 3.21 MIL/uL — ABNORMAL LOW (ref 3.87–5.11)
RDW: 15.4 % (ref 11.5–15.5)
WBC: 5.8 10*3/uL (ref 4.0–10.5)
nRBC: 0 % (ref 0.0–0.2)

## 2019-05-29 LAB — BASIC METABOLIC PANEL
Anion gap: 10 (ref 5–15)
BUN: 18 mg/dL (ref 6–20)
CO2: 25 mmol/L (ref 22–32)
Calcium: 8 mg/dL — ABNORMAL LOW (ref 8.9–10.3)
Chloride: 100 mmol/L (ref 98–111)
Creatinine, Ser: 0.71 mg/dL (ref 0.44–1.00)
GFR calc Af Amer: >60 mL/min (ref >60)
GFR calc non Af Amer: >60 mL/min (ref >60)
Glucose, Bld: 207 mg/dL — ABNORMAL HIGH (ref 70–99)
Potassium: 3.2 mmol/L — ABNORMAL LOW (ref 3.5–5.1)
Sodium: 135 mmol/L (ref 135–145)

## 2019-05-29 LAB — PREPARE RBC (CROSSMATCH)

## 2019-05-29 LAB — GLUCOSE, CAPILLARY
Glucose-Capillary: 192 mg/dL — ABNORMAL HIGH (ref 70–99)
Glucose-Capillary: 126 mg/dL — ABNORMAL HIGH (ref 70–99)
Glucose-Capillary: 141 mg/dL — ABNORMAL HIGH (ref 70–99)
Glucose-Capillary: 240 mg/dL — ABNORMAL HIGH (ref 70–99)
Glucose-Capillary: 152 mg/dL — ABNORMAL HIGH (ref 70–99)

## 2019-05-29 LAB — HEMOGLOBIN A1C
Hgb A1c MFr Bld: 7.7 % — ABNORMAL HIGH (ref 4.8–5.6)
Mean Plasma Glucose: 174.29 mg/dL

## 2019-05-29 SURGERY — OPEN REDUCTION INTERNAL FIXATION FEMORAL SHAFT FRACTURE
Anesthesia: General | Site: Leg Upper | Laterality: Left

## 2019-05-29 MED ORDER — SODIUM CHLORIDE 0.9% IV SOLUTION
Freq: Once | INTRAVENOUS | Status: AC
  Administered 2019-05-31: 08:00:00 via INTRAVENOUS

## 2019-05-29 MED ORDER — ALBUTEROL SULFATE (2.5 MG/3ML) 0.083% IN NEBU: 2.5000 mg | INHALATION_SOLUTION | RESPIRATORY_TRACT | Status: DC | PRN

## 2019-05-29 MED ORDER — FENTANYL CITRATE (PF) 250 MCG/5ML IJ SOLN
INTRAMUSCULAR | Status: AC
  Filled 2019-05-29: qty 5

## 2019-05-29 MED ORDER — INSULIN ASPART 100 UNIT/ML ~~LOC~~ SOLN
0.0000 [IU] | Freq: Three times a day (TID) | SUBCUTANEOUS | Status: DC
  Administered 2019-05-29: 5 [IU] via SUBCUTANEOUS
  Administered 2019-05-30 – 2019-05-31 (×6): 2 [IU] via SUBCUTANEOUS
  Administered 2019-06-01 (×2): 3 [IU] via SUBCUTANEOUS
  Administered 2019-06-01 – 2019-06-03 (×5): 2 [IU] via SUBCUTANEOUS

## 2019-05-29 MED ORDER — DEXAMETHASONE SODIUM PHOSPHATE 10 MG/ML IJ SOLN
INTRAMUSCULAR | Status: AC
  Filled 2019-05-29: qty 2

## 2019-05-29 MED ORDER — MOMETASONE FURO-FORMOTEROL FUM 200-5 MCG/ACT IN AERO
2.0000 | INHALATION_SPRAY | Freq: Two times a day (BID) | RESPIRATORY_TRACT | Status: DC
  Administered 2019-05-29 – 2019-06-03 (×10): 2 via RESPIRATORY_TRACT
  Filled 2019-05-29: qty 8.8

## 2019-05-29 MED ORDER — GABAPENTIN 300 MG PO CAPS
300.0000 mg | ORAL_CAPSULE | Freq: Three times a day (TID) | ORAL | Status: DC
  Administered 2019-05-29 – 2019-06-03 (×15): 300 mg via ORAL
  Filled 2019-05-29 (×15): qty 1

## 2019-05-29 MED ORDER — DEXMEDETOMIDINE HCL IN NACL 200 MCG/50ML IV SOLN
INTRAVENOUS | Status: AC
  Filled 2019-05-29: qty 100

## 2019-05-29 MED ORDER — CHLORHEXIDINE GLUCONATE 4 % EX LIQD: 60.0000 mL | Freq: Once | CUTANEOUS | Status: DC

## 2019-05-29 MED ORDER — PROMETHAZINE HCL 25 MG/ML IJ SOLN: 6.2500 mg | INTRAMUSCULAR | Status: DC | PRN

## 2019-05-29 MED ORDER — ONDANSETRON HCL 4 MG PO TABS
4.0000 mg | ORAL_TABLET | Freq: Four times a day (QID) | ORAL | Status: DC | PRN
  Administered 2019-05-30: 4 mg via ORAL
  Filled 2019-05-29: qty 1

## 2019-05-29 MED ORDER — FUROSEMIDE 20 MG PO TABS
20.0000 mg | ORAL_TABLET | Freq: Every day | ORAL | Status: DC
  Administered 2019-05-29 – 2019-06-03 (×6): 20 mg via ORAL
  Filled 2019-05-29 (×6): qty 1

## 2019-05-29 MED ORDER — HYDROMORPHONE HCL 1 MG/ML IJ SOLN
1.0000 mg | INTRAMUSCULAR | Status: DC | PRN
  Administered 2019-05-29: 1 mg via INTRAVENOUS
  Filled 2019-05-29: qty 1

## 2019-05-29 MED ORDER — DEXAMETHASONE SODIUM PHOSPHATE 10 MG/ML IJ SOLN
INTRAMUSCULAR | Status: DC | PRN
  Administered 2019-05-29: 5 mg via INTRAVENOUS

## 2019-05-29 MED ORDER — POTASSIUM CHLORIDE CRYS ER 20 MEQ PO TBCR
40.0000 meq | EXTENDED_RELEASE_TABLET | Freq: Once | ORAL | Status: AC
  Administered 2019-05-29: 40 meq via ORAL
  Filled 2019-05-29: qty 2

## 2019-05-29 MED ORDER — VANCOMYCIN HCL 1000 MG IV SOLR
INTRAVENOUS | Status: AC
  Filled 2019-05-29: qty 1000

## 2019-05-29 MED ORDER — ENOXAPARIN SODIUM 40 MG/0.4ML ~~LOC~~ SOLN
40.0000 mg | SUBCUTANEOUS | Status: DC
  Administered 2019-05-30 – 2019-06-03 (×5): 40 mg via SUBCUTANEOUS
  Filled 2019-05-29 (×5): qty 0.4

## 2019-05-29 MED ORDER — ALPRAZOLAM 0.5 MG PO TABS
0.5000 mg | ORAL_TABLET | Freq: Three times a day (TID) | ORAL | Status: DC
  Administered 2019-05-29 – 2019-06-03 (×15): 0.5 mg via ORAL
  Filled 2019-05-29 (×15): qty 1

## 2019-05-29 MED ORDER — ONDANSETRON HCL 4 MG/2ML IJ SOLN: 4.0000 mg | Freq: Four times a day (QID) | INTRAMUSCULAR | Status: DC | PRN

## 2019-05-29 MED ORDER — ACETAMINOPHEN 500 MG PO TABS
1000.0000 mg | ORAL_TABLET | Freq: Four times a day (QID) | ORAL | Status: DC
  Administered 2019-05-29 – 2019-06-03 (×19): 1000 mg via ORAL
  Filled 2019-05-29 (×21): qty 2

## 2019-05-29 MED ORDER — METOCLOPRAMIDE HCL 5 MG/ML IJ SOLN: 5.0000 mg | Freq: Three times a day (TID) | INTRAMUSCULAR | Status: DC | PRN

## 2019-05-29 MED ORDER — METOCLOPRAMIDE HCL 5 MG PO TABS: 5.0000 mg | ORAL_TABLET | Freq: Three times a day (TID) | ORAL | Status: DC | PRN

## 2019-05-29 MED ORDER — PROPOFOL 10 MG/ML IV BOLUS
INTRAVENOUS | Status: DC | PRN
  Administered 2019-05-29: 140 mg via INTRAVENOUS

## 2019-05-29 MED ORDER — METHOCARBAMOL 1000 MG/10ML IJ SOLN
500.0000 mg | Freq: Four times a day (QID) | INTRAVENOUS | Status: DC | PRN
  Filled 2019-05-29: qty 5

## 2019-05-29 MED ORDER — 0.9 % SODIUM CHLORIDE (POUR BTL) OPTIME
TOPICAL | Status: DC | PRN
  Administered 2019-05-29: 1000 mL

## 2019-05-29 MED ORDER — OXYCODONE HCL 5 MG PO TABS
5.0000 mg | ORAL_TABLET | ORAL | Status: DC | PRN
  Administered 2019-05-30 – 2019-05-31 (×2): 10 mg via ORAL
  Administered 2019-06-01: 5 mg via ORAL
  Administered 2019-06-02: 10 mg via ORAL
  Filled 2019-05-29 (×5): qty 2
  Filled 2019-05-29: qty 1
  Filled 2019-05-29 (×3): qty 2

## 2019-05-29 MED ORDER — VANCOMYCIN HCL 10 G IV SOLR
1750.0000 mg | Freq: Once | INTRAVENOUS | Status: AC
  Administered 2019-05-29: 1750 mg via INTRAVENOUS
  Filled 2019-05-29: qty 1750

## 2019-05-29 MED ORDER — ROCURONIUM BROMIDE 10 MG/ML (PF) SYRINGE
PREFILLED_SYRINGE | INTRAVENOUS | Status: DC | PRN
  Administered 2019-05-29: 20 mg via INTRAVENOUS
  Administered 2019-05-29: 30 mg via INTRAVENOUS
  Administered 2019-05-29: 50 mg via INTRAVENOUS

## 2019-05-29 MED ORDER — ONDANSETRON HCL 4 MG/2ML IJ SOLN
INTRAMUSCULAR | Status: AC
  Filled 2019-05-29: qty 4

## 2019-05-29 MED ORDER — PHENYLEPHRINE 40 MCG/ML (10ML) SYRINGE FOR IV PUSH (FOR BLOOD PRESSURE SUPPORT)
PREFILLED_SYRINGE | INTRAVENOUS | Status: DC | PRN
  Administered 2019-05-29: 60 ug via INTRAVENOUS
  Administered 2019-05-29 (×3): 80 ug via INTRAVENOUS

## 2019-05-29 MED ORDER — SODIUM CHLORIDE 0.9 % IV SOLN
2.0000 g | INTRAVENOUS | Status: AC
  Administered 2019-05-29 – 2019-05-31 (×3): 2 g via INTRAVENOUS
  Filled 2019-05-29 (×3): qty 20

## 2019-05-29 MED ORDER — POTASSIUM CHLORIDE IN NACL 20-0.9 MEQ/L-% IV SOLN
INTRAVENOUS | Status: DC
  Administered 2019-05-29 – 2019-06-02 (×3): via INTRAVENOUS
  Filled 2019-05-29: qty 1000

## 2019-05-29 MED ORDER — SODIUM CHLORIDE 0.9 % IV SOLN
INTRAVENOUS | Status: DC | PRN
  Administered 2019-05-29: 09:00:00 50 ug/min via INTRAVENOUS

## 2019-05-29 MED ORDER — PNEUMOCOCCAL VAC POLYVALENT 25 MCG/0.5ML IJ INJ
0.5000 mL | INJECTION | INTRAMUSCULAR | Status: AC
  Administered 2019-05-30: 0.5 mL via INTRAMUSCULAR
  Filled 2019-05-29: qty 0.5

## 2019-05-29 MED ORDER — MIDAZOLAM HCL 5 MG/5ML IJ SOLN
INTRAMUSCULAR | Status: DC | PRN
  Administered 2019-05-29: 2 mg via INTRAVENOUS

## 2019-05-29 MED ORDER — DOCUSATE SODIUM 100 MG PO CAPS
100.0000 mg | ORAL_CAPSULE | Freq: Two times a day (BID) | ORAL | Status: DC
  Administered 2019-05-29 – 2019-06-02 (×9): 100 mg via ORAL
  Filled 2019-05-29 (×10): qty 1

## 2019-05-29 MED ORDER — MIDAZOLAM HCL 2 MG/2ML IJ SOLN
INTRAMUSCULAR | Status: AC
  Filled 2019-05-29: qty 2

## 2019-05-29 MED ORDER — ALBUMIN HUMAN 5 % IV SOLN
INTRAVENOUS | Status: DC | PRN
  Administered 2019-05-29: 10:00:00 via INTRAVENOUS

## 2019-05-29 MED ORDER — VANCOMYCIN HCL 1000 MG IV SOLR
INTRAVENOUS | Status: DC | PRN
  Administered 2019-05-29: 1000 mg via TOPICAL

## 2019-05-29 MED ORDER — TOBRAMYCIN SULFATE 1.2 G IJ SOLR
INTRAMUSCULAR | Status: AC
  Filled 2019-05-29: qty 1.2

## 2019-05-29 MED ORDER — SUGAMMADEX SODIUM 200 MG/2ML IV SOLN
INTRAVENOUS | Status: DC | PRN
  Administered 2019-05-29: 250 mg via INTRAVENOUS

## 2019-05-29 MED ORDER — TOBRAMYCIN SULFATE 80 MG/2ML IJ SOLN
INTRAMUSCULAR | Status: AC
  Filled 2019-05-29: qty 2

## 2019-05-29 MED ORDER — VITAMIN D (ERGOCALCIFEROL) 1.25 MG (50000 UNIT) PO CAPS
50000.0000 [IU] | ORAL_CAPSULE | ORAL | Status: DC
  Administered 2019-05-31: 50000 [IU] via ORAL
  Filled 2019-05-29: qty 1

## 2019-05-29 MED ORDER — LACTATED RINGERS IV SOLN
INTRAVENOUS | Status: DC | PRN
  Administered 2019-05-29 (×2): via INTRAVENOUS

## 2019-05-29 MED ORDER — VANCOMYCIN HCL IN DEXTROSE 1-5 GM/200ML-% IV SOLN
1000.0000 mg | INTRAVENOUS | Status: DC
  Administered 2019-05-30 – 2019-06-01 (×3): 1000 mg via INTRAVENOUS
  Filled 2019-05-29 (×3): qty 200

## 2019-05-29 MED ORDER — OXYCODONE HCL 5 MG PO TABS
10.0000 mg | ORAL_TABLET | ORAL | Status: DC | PRN
  Administered 2019-06-01 – 2019-06-03 (×4): 10 mg via ORAL
  Filled 2019-05-29 (×2): qty 2

## 2019-05-29 MED ORDER — DEXMEDETOMIDINE HCL 200 MCG/2ML IV SOLN
INTRAVENOUS | Status: DC | PRN
  Administered 2019-05-29 (×2): 12 ug via INTRAVENOUS

## 2019-05-29 MED ORDER — TOBRAMYCIN SULFATE 1.2 G IJ SOLR
INTRAMUSCULAR | Status: DC | PRN
  Administered 2019-05-29: 1.2 g via TOPICAL

## 2019-05-29 MED ORDER — LIDOCAINE 2% (20 MG/ML) 5 ML SYRINGE
INTRAMUSCULAR | Status: DC | PRN
  Administered 2019-05-29 (×2): 50 mg via INTRAVENOUS

## 2019-05-29 MED ORDER — FENTANYL CITRATE (PF) 250 MCG/5ML IJ SOLN
INTRAMUSCULAR | Status: DC | PRN
  Administered 2019-05-29 (×2): 50 ug via INTRAVENOUS
  Administered 2019-05-29: 100 ug via INTRAVENOUS
  Administered 2019-05-29 (×6): 50 ug via INTRAVENOUS

## 2019-05-29 MED ORDER — ONDANSETRON HCL 4 MG/2ML IJ SOLN
INTRAMUSCULAR | Status: DC | PRN
  Administered 2019-05-29: 4 mg via INTRAVENOUS

## 2019-05-29 MED ORDER — METHOCARBAMOL 500 MG PO TABS
500.0000 mg | ORAL_TABLET | Freq: Four times a day (QID) | ORAL | Status: DC | PRN
  Administered 2019-05-30 – 2019-06-03 (×8): 500 mg via ORAL
  Filled 2019-05-29 (×8): qty 1

## 2019-05-29 MED ORDER — GEMFIBROZIL 600 MG PO TABS
600.0000 mg | ORAL_TABLET | Freq: Two times a day (BID) | ORAL | Status: DC
  Administered 2019-05-29 – 2019-06-03 (×10): 600 mg via ORAL
  Filled 2019-05-29 (×12): qty 1

## 2019-05-29 MED ORDER — CEFAZOLIN SODIUM-DEXTROSE 2-4 GM/100ML-% IV SOLN
2.0000 g | INTRAVENOUS | Status: AC
  Administered 2019-05-29: 08:00:00 2 g via INTRAVENOUS
  Filled 2019-05-29: qty 100

## 2019-05-29 MED ORDER — FENTANYL CITRATE (PF) 100 MCG/2ML IJ SOLN: 25.0000 ug | INTRAMUSCULAR | Status: DC | PRN

## 2019-05-29 SURGICAL SUPPLY — 91 items
APL PRP STRL LF DISP 70% ISPRP (MISCELLANEOUS) ×2
BANDAGE ESMARK 6X9 LF (GAUZE/BANDAGES/DRESSINGS) ×1 IMPLANT
BIT DRILL LONG 3.3 (BIT) ×1 IMPLANT
BIT DRILL LONG 3.3MM (BIT) ×1
BIT DRILL QC 3.3X195 (BIT) ×2 IMPLANT
BNDG CMPR 9X6 STRL LF SNTH (GAUZE/BANDAGES/DRESSINGS) ×1
BNDG CMPR MED 15X6 ELC VLCR LF (GAUZE/BANDAGES/DRESSINGS) ×1
BNDG COHESIVE 6X5 TAN STRL LF (GAUZE/BANDAGES/DRESSINGS) ×3 IMPLANT
BNDG ELASTIC 4X5.8 VLCR STR LF (GAUZE/BANDAGES/DRESSINGS) ×3 IMPLANT
BNDG ELASTIC 6X15 VLCR STRL LF (GAUZE/BANDAGES/DRESSINGS) ×2 IMPLANT
BNDG ELASTIC 6X5.8 VLCR STR LF (GAUZE/BANDAGES/DRESSINGS) ×3 IMPLANT
BNDG ESMARK 6X9 LF (GAUZE/BANDAGES/DRESSINGS) ×3
BNDG GAUZE ELAST 4 BULKY (GAUZE/BANDAGES/DRESSINGS) ×6 IMPLANT
BONE CANC CHIPS 40CC CAN1/2 (Bone Implant) ×3 IMPLANT
BRUSH SCRUB EZ PLAIN DRY (MISCELLANEOUS) ×6 IMPLANT
CAP LOCK NCB (Cap) ×16 IMPLANT
CHIPS CANC BONE 40CC CAN1/2 (Bone Implant) ×1 IMPLANT
CHLORAPREP W/TINT 26 (MISCELLANEOUS) ×6 IMPLANT
CLOSURE WOUND 1/2 X4 (GAUZE/BANDAGES/DRESSINGS)
COVER SURGICAL LIGHT HANDLE (MISCELLANEOUS) ×6 IMPLANT
COVER WAND RF STERILE (DRAPES) ×3 IMPLANT
CUFF TOURN SGL QUICK 24 (TOURNIQUET CUFF)
CUFF TOURN SGL QUICK 34 (TOURNIQUET CUFF)
CUFF TRNQT CYL 24X4X16.5-23 (TOURNIQUET CUFF) IMPLANT
CUFF TRNQT CYL 34X4.125X (TOURNIQUET CUFF) IMPLANT
DRAPE C-ARM 42X72 X-RAY (DRAPES) ×3 IMPLANT
DRAPE C-ARMOR (DRAPES) ×3 IMPLANT
DRAPE IMP U-DRAPE 54X76 (DRAPES) ×3 IMPLANT
DRAPE U-SHAPE 47X51 STRL (DRAPES) ×3 IMPLANT
DRSG ADAPTIC 3X8 NADH LF (GAUZE/BANDAGES/DRESSINGS) ×3 IMPLANT
DRSG MEPILEX BORDER 4X4 (GAUZE/BANDAGES/DRESSINGS) ×6 IMPLANT
DRSG MEPILEX BORDER 4X8 (GAUZE/BANDAGES/DRESSINGS) ×4 IMPLANT
DRSG PAD ABDOMINAL 8X10 ST (GAUZE/BANDAGES/DRESSINGS) ×12 IMPLANT
ELECT REM PT RETURN 9FT ADLT (ELECTROSURGICAL) ×3
ELECTRODE REM PT RTRN 9FT ADLT (ELECTROSURGICAL) ×1 IMPLANT
EVACUATOR 1/8 PVC DRAIN (DRAIN) IMPLANT
GAUZE SPONGE 4X4 12PLY STRL (GAUZE/BANDAGES/DRESSINGS) ×3 IMPLANT
GLOVE BIO SURGEON STRL SZ 6.5 (GLOVE) ×6 IMPLANT
GLOVE BIO SURGEON STRL SZ7.5 (GLOVE) ×12 IMPLANT
GLOVE BIO SURGEONS STRL SZ 6.5 (GLOVE) ×3
GLOVE BIOGEL PI IND STRL 6.5 (GLOVE) ×1 IMPLANT
GLOVE BIOGEL PI IND STRL 7.5 (GLOVE) ×1 IMPLANT
GLOVE BIOGEL PI INDICATOR 6.5 (GLOVE) ×2
GLOVE BIOGEL PI INDICATOR 7.5 (GLOVE) ×2
GOWN STRL REUS W/ TWL LRG LVL3 (GOWN DISPOSABLE) ×2 IMPLANT
GOWN STRL REUS W/TWL LRG LVL3 (GOWN DISPOSABLE) ×6
GRAFT BNE CHIP CANC 1-8 40 (Bone Implant) IMPLANT
K-WIRE 2.0 (WIRE) ×3
K-WIRE FXSTD 280X2XNS SS (WIRE) ×1
KIT BASIN OR (CUSTOM PROCEDURE TRAY) ×3 IMPLANT
KIT TURNOVER KIT B (KITS) ×3 IMPLANT
KWIRE FXSTD 280X2XNS SS (WIRE) IMPLANT
MANIFOLD NEPTUNE II (INSTRUMENTS) ×3 IMPLANT
NEEDLE 22X1 1/2 (OR ONLY) (NEEDLE) IMPLANT
NS IRRIG 1000ML POUR BTL (IV SOLUTION) ×3 IMPLANT
PACK TOTAL JOINT (CUSTOM PROCEDURE TRAY) ×3 IMPLANT
PAD ARMBOARD 7.5X6 YLW CONV (MISCELLANEOUS) ×6 IMPLANT
PADDING CAST COTTON 6X4 STRL (CAST SUPPLIES) ×5 IMPLANT
PLATE DIST FEM 12H (Plate) ×2 IMPLANT
PUTTY DBM STAGRAFT PLUS 10CC (Putty) ×2 IMPLANT
SCREW 5.0 70MM (Screw) ×2 IMPLANT
SCREW 5.0 80MM (Screw) ×2 IMPLANT
SCREW NCB 3.5X75X5X6.2XST (Screw) IMPLANT
SCREW NCB 4.0X36MM (Screw) ×2 IMPLANT
SCREW NCB 5.0X36MM (Screw) ×2 IMPLANT
SCREW NCB 5.0X38 (Screw) ×4 IMPLANT
SCREW NCB 5.0X75MM (Screw) ×9 IMPLANT
SCREW NCB 5.0X85MM (Screw) ×2 IMPLANT
SPONGE LAP 18X18 RF (DISPOSABLE) ×3 IMPLANT
STAPLER VISISTAT 35W (STAPLE) ×3 IMPLANT
STOCKINETTE IMPERVIOUS LG (DRAPES) ×3 IMPLANT
STRIP CLOSURE SKIN 1/2X4 (GAUZE/BANDAGES/DRESSINGS) IMPLANT
SUCTION FRAZIER HANDLE 10FR (MISCELLANEOUS)
SUCTION TUBE FRAZIER 10FR DISP (MISCELLANEOUS) IMPLANT
SUT ETHILON 3 0 PS 1 (SUTURE) ×8 IMPLANT
SUT MON AB 2-0 CT1 36 (SUTURE) ×5 IMPLANT
SUT VIC AB 0 CT1 27 (SUTURE) ×6
SUT VIC AB 0 CT1 27XBRD ANBCTR (SUTURE) ×1 IMPLANT
SUT VIC AB 1 CT1 27 (SUTURE) ×3
SUT VIC AB 1 CT1 27XBRD ANTBC (SUTURE) ×4 IMPLANT
SUT VIC AB 2-0 CT1 27 (SUTURE) ×6
SUT VIC AB 2-0 CT1 TAPERPNT 27 (SUTURE) ×1 IMPLANT
SYR CONTROL 10ML LL (SYRINGE) IMPLANT
TOWEL GREEN STERILE (TOWEL DISPOSABLE) ×6 IMPLANT
TOWEL GREEN STERILE FF (TOWEL DISPOSABLE) ×6 IMPLANT
TRAY FOLEY MTR SLVR 16FR STAT (SET/KITS/TRAYS/PACK) IMPLANT
TUBE CONNECTING 12'X1/4 (SUCTIONS) ×1
TUBE CONNECTING 12X1/4 (SUCTIONS) ×2 IMPLANT
UNDERPAD 30X30 (UNDERPADS AND DIAPERS) ×3 IMPLANT
WATER STERILE IRR 1000ML POUR (IV SOLUTION) ×9 IMPLANT
YANKAUER SUCT BULB TIP NO VENT (SUCTIONS) ×3 IMPLANT

## 2019-05-29 NOTE — Progress Notes (Signed)
Pt refusing CPM at this time. RN educated pt. Pt continued to refuse. Will continue to monitor.

## 2019-05-29 NOTE — H&P (Signed)
Orthopaedic Trauma Service (OTS) H&P  Patient ID:  Sandra Ferrell  MRN: [id number]  DOB/AGE: Apr 15, 1961 58 y.o.   Reason for Surgery: Left distal femur nonunion   HPI: Sandra Ferrell is an 58 y.o. white female with an extensive medical history who sustained a ground-level fall resulting in a closed left distal femur fracture back in August 2019. Patient was brought to the hospital. She was taken to the operating room by Dr. Grandville Silos where intramedullary nailing was performed. Excellent alignment and nail placement was noted on the intraoperative and postoperative x-rays. Subsequently discharged to skilled nursing facility. Patient had poor follow-up in the postoperative period. She missed many appointments. It also appears that she was noncompliant with therapy. At some point she did follow-up and it was found to have loosening of her distal screws. She was taken to the operating room for removal of distal locking bolt from her retrograde femoral nail, October 2019. Subsequently discharged from the hospital again with instructions for therapy. She was seen and evaluated by Dr. Grandville Silos in February 2020. CT scan was obtained which demonstrated nonunion of her distal femur fracture. Vitamin D level was also obtained and February which was notable for severe vitamin D deficiency at 8.0 ng/mL, patient was started on vitamin D2 50,000 units weekly at that time. Patient states that she is continuing to take this. It sounds as a low intensity pulsed ultrasound bone stimulator was obtained for the patient she claims that she has been using it for the last 2 months.   We were consulted in May 2020 at which point her Hgb A1c was 13+. She has since gotten this better controlled and vitamin D levels have improved significantly as well. CT Scan still shows nonunion with hardware failure and patient still cannot move the leg or bear weight. As a result I recommended proceeding with repair of left femur  nonunion.  Patient lives in a single-story house with everything on level.    Patient also has a history of multiple back surgeries. She states that she is not on any chronic narcotics.          Past Medical History:  Diagnosis Date  . Allergic rhinitis, cause unspecified   . Anemia   . Anxiety state, unspecified   . Backache, unspecified   . Calculus of kidney   . Carpal tunnel syndrome, right   . CHF (congestive heart failure) ([place])    unspecified   . Chronic pain syndrome   . Constipation   . COPD (chronic obstructive pulmonary disease) ([place])   . Cough   . Depressive disorder, not elsewhere classified    managed with medications  . Difficulty in walking   . Displaced intertrochanteric fracture of left femur ([place])   . Esophageal reflux   . Extrinsic asthma with exacerbation   . Fibromyalgia   . Heart murmur   . Hyperlipidemia   . Hypertension   . Muscle weakness (generalized)   . Nicotine dependence, cigarettes, uncomplicated   . Osteoarthrosis, unspecified whether generalized or localized, unspecified site   . Other abnormalities of gait and mobility   . Other and unspecified hyperlipidemia   . Other irritable bowel syndrome   . Other screening mammogram   . Personal history of unspecified urinary disorder   . Pneumonia   . Pressure ulcer of sacral region   . Routine general medical examination at a health care facility   . Routine gynecological examination   . Tobacco use disorder   .  Type II or unspecified type diabetes mellitus without mention of complication, not stated as uncontrolled   . Unspecified fall, subsequent encounter   . Unspecified sleep apnea   . Urinary tract infection   . Vaginitis   . Wears glasses   . Wheezing         Past Surgical History:  Procedure Laterality Date  . ANTERIOR CERVICAL DECOMP/DISCECTOMY FUSION N/A 01/19/2015   Procedure: ANTERIOR CERVICAL DECOMPRESSION/DISCECTOMY FUSION 2 LEVELS; Surgeon: Phylliss Bob, MD; Location:  [place]; Service: Orthopedics; Laterality: N/A; Anterior cervical decompression fusion, cervical 5-6, cervical 6-7 with instrumentation and allograft  . ANTERIOR CERVICAL DECOMP/DISCECTOMY FUSION N/A 05/23/2017   Procedure: ANTERIOR CERVICAL DECOMPRESSION FUSION, CERVICAL 4-5 WITH INSTRUMENTATION AND ALLOGRAFT; Surgeon: Phylliss Bob, MD; Location: [facility]; Service: Orthopedics; Laterality: N/A; ANTERIOR CERVICAL DECOMPRESSION FUSION, CERVICAL 4-5 WITH INSTRUMENTATION AND ALLOGRAFT; REQUEST 2 HOURS AND FLIP ROOM  . APPENDECTOMY  1973  . BACK SURGERY    . CHOLECYSTECTOMY  08/2006  . COLONOSCOPY    . HARDWARE REMOVAL Left 08/20/2018   Procedure: HARDWARE REMOVAL FROM LEFT KNEE; Surgeon: Milly Jakob, MD; Location: [facility]; Service: Orthopedics; Laterality: Left;  . ORIF FEMUR FRACTURE Left 06/29/2018   Procedure: OPEN REDUCTION INTERNAL FIXATION (ORIF) DISTAL FEMUR FRACTURE; Surgeon: Milly Jakob, MD; Location: [facility]; Service: Orthopedics; Laterality: Left;  . TRANSTHORACIC ECHOCARDIOGRAM  02/2011   mild LVH, nl EF, mild diastolic dysfunction, no wall motion abnl        Family History  Problem Relation Age of Onset  . Stroke Father   . Colon cancer Cousin   Social History: reports that she has quit smoking. Her smoking use included cigarettes. She has a 35.00 pack-year smoking history. She has never used smokeless tobacco. She reports that she does not drink alcohol or use drugs.  Allergies:       Allergies  Allergen Reactions  . Benazepril Anaphylaxis, Swelling and Other (See Comments)    Angioedema, throat swelling  . Diclofenac Sodium Other (See Comments)    GI bleed  . Fluticasone-Salmeterol Hives, Itching and Swelling    Tongue was swollen  . Nsaids Other (See Comments)    Rectal bleeding  . Penicillins Hives and Rash    Has patient had a PCN reaction causing immediate rash, facial/tongue/throat swelling, SOB or lightheadedness with hypotension: no  Has patient had a PCN reaction  causing severe rash involving mucus membranes or skin necrosis: no  Has patient had a PCN reaction that required hospitalization no  Has patient had a PCN reaction occurring within the last 10 years: no  If all of the above answers are "NO", then may proceed with Cephalosporin use.   Marland Kitchen Morphine And Related     rash  . Aspirin Other (See Comments)    Burns stomach  . Propoxyphene Other (See Comments)    Darvocet - Headache  Medications:  I have reviewed the patient's current medications.  Prior to Admission:         Medications Prior to Admission  Medication Sig Dispense Refill Last Dose  . acetaminophen (TYLENOL) 325 MG tablet Take 650 mg by mouth every 6 (six) hours as needed for mild pain or headache.   03/24/2019 at Unknown time  . acetaminophen-codeine (TYLENOL #3) 300-30 MG tablet Take 1 tablet by mouth 2 (two) times daily as needed for moderate pain.    03/24/2019 at Unknown time  . ALPRAZolam (XANAX) 0.5 MG tablet TAKE 1 TABLET(0.5 MG) BY MOUTH THREE TIMES DAILY (Patient taking differently:  Take 0.5 mg by mouth 3 (three) times daily. ) 90 tablet 0 03/24/2019 at Unknown time  . amitriptyline (ELAVIL) 75 MG tablet Take 1 tablet (75 mg total) by mouth at bedtime. 10 tablet 0 03/21/2019 at Unknown time  . budesonide-formoterol (SYMBICORT) 160-4.5 MCG/ACT inhaler INHALE 2 PUFFS INTO THE LUNGS TWICE DAILY (Patient taking differently: Inhale 2 puffs into the lungs 2 (two) times daily. ) 10.2 g 2 03/24/2019 at Unknown time  . dimenhyDRINATE (DRAMAMINE PO) Take 1 tablet by mouth daily as needed (nausea).   03/24/2019 at Unknown time  . feeding supplement, GLUCERNA SHAKE, (GLUCERNA SHAKE) LIQD Take 237 mLs by mouth 2 (two) times daily between meals. (Patient taking differently: Take 237 mLs by mouth 2 (two) times daily as needed (low sugar). ) 10 Can 0 Past Week at Unknown time  . furosemide (LASIX) 20 MG tablet TAKE 1 TABLET(20 MG) BY MOUTH DAILY (Patient taking differently: Take 20 mg by mouth daily.  ) 90 tablet 0 Past Week at Unknown time  . gabapentin (NEURONTIN) 300 MG capsule TAKE 1 CAPSULE BY MOUTH IN THE MORNING, 1 CAPSULE IN THE AFTERNOON, AND 2 CAPSULES AT BEDTIME (Patient taking differently: Take 300-600 mg by mouth 3 (three) times daily. 350m am  3021mnoon  60032mm) 120 capsule 0 03/24/2019 at Unknown time  . gemfibrozil (LOPID) 600 MG tablet TAKE 1 TABLET BY MOUTH TWICE DAILY (Patient taking differently: Take 600 mg by mouth 2 (two) times daily before a meal. ) 60 tablet 5 03/24/2019 at Unknown time  . ibuprofen (ADVIL) 200 MG tablet Take 600 mg by mouth every 6 (six) hours as needed for headache or mild pain.   03/24/2019 at Unknown time  . Insulin Glargine (LANTUS SOLOSTAR) 100 UNIT/ML Solostar Pen ADMINISTER 35 UNITS UNDER THE SKIN AT BEDTIME (Patient taking differently: Inject 30 Units into the skin at bedtime. ) 15 mL 5 03/24/2019 at Unknown time  . Liniments (SALONPAS PAIN RELIEF PATCH EX) Apply 1 patch topically as needed (pain).   03/19/2019  . metFORMIN (GLUCOPHAGE) 1000 MG tablet TAKE 1 TABLET BY MOUTH TWICE DAILY WITH MEALS (Patient taking differently: Take 1,000 mg by mouth 2 (two) times daily with a meal. ) 180 tablet 1 03/24/2019 at Unknown time  . methocarbamol (ROBAXIN) 500 MG tablet Take 2 tablets (1,000 mg total) by mouth at bedtime as needed for muscle spasms. 60 tablet 2 03/24/2019 at Unknown time  . omeprazole (PRILOSEC OTC) 20 MG tablet Take 20 mg by mouth daily.    03/24/2019 at Unknown time  . OVER THE COUNTER MEDICATION Apply 1 application topically daily as needed (pain). Hempvana   Past Week at Unknown time  . OVER THE COUNTER MEDICATION Take 2 tablets by mouth daily. OTC 8 hour allergy pill   03/24/2019 at Unknown time  . pioglitazone (ACTOS) 45 MG tablet TAKE 1 TABLET BY MOUTH EVERY DAY (Patient taking differently: Take 45 mg by mouth daily. ) 90 tablet 1 03/24/2019 at Unknown time  . PROAIR HFA 108 (90 Base) MCG/ACT inhaler TAKE 2 PUFFS BY MOUTH EVERY 4 HOURS AS  NEEDED FOR WHEEZE OR FOR SHORTNESS OF BREATH (Patient taking differently: Inhale 2 puffs into the lungs every 4 (four) hours as needed for wheezing or shortness of breath. ) 8.5 Inhaler 5 > 1 month  . Vitamin D, Ergocalciferol, (DRISDOL) 1.25 MG (50000 UT) CAPS capsule Take 1 capsule (50,000 Units total) by mouth every 7 (seven) days. 5 capsule 0 03/22/2019  . ACCU-CHEK AVIVA  PLUS test strip TEST BLOOD SUGAR TWICE DAILY AND DIRECTED 100 each 11 Taking  . ACCU-CHEK SOFTCLIX LANCETS lancets CHECK BLOOD SUGAR TWICE DAILY AND AS DIRECTED 100 each 11 Taking  . AIMSCO INSULIN SYR ULTRA THIN 31G X 5/16" 0.3 ML MISC    Taking  . Alcohol Swabs PADS Check blood sugar twice a day and as directed. Dx E11.9 100 each 5 Taking  . B-D ULTRAFINE III SHORT PEN 31G X 8 MM MISC USE AS DIRECTED WITH INSULIN PEN 100 each 11 Taking  . Blood Glucose Monitoring Suppl (ACCU-CHEK AVIVA PLUS) w/Device KIT Check blood sugar twice daily and as directed. 1 kit 0 Taking  . dicyclomine (BENTYL) 20 MG tablet Take 1 tablet (20 mg total) 2 (two) times daily by mouth. (Patient not taking: Reported on 03/25/2019) 20 tablet 0 Not Taking at Unknown time  . Lancet Devices (ADJUSTABLE LANCING DEVICE) MISC Check blood sugar twice a day and as directed. Dx E11.9 100 each 5 Taking  . sulfamethoxazole-trimethoprim (BACTRIM DS) 800-160 MG tablet Take 1 tablet by mouth 2 (two) times daily. (Patient not taking: Reported on 03/25/2019) 6 tablet 0 Not Taking at Unknown time   Review of Systems  Constitutional: Negative for chills and fever.  Respiratory: Negative for shortness of breath and wheezing.  Cardiovascular: Negative for chest pain and palpitations.  Gastrointestinal: Negative for abdominal pain, nausea and vomiting.  Musculoskeletal:  Left knee flexion contracture   Vitals:   05/29/19 0617  BP: (!) 132/57  Pulse: 100  Resp: 20  Temp: 98.4 F (36.9 C)  SpO2: 98%   .  Physical Exam  Vitals signs and nursing note reviewed.   Constitutional:  Comments: Awake and alert, no acute distress, appears older than stated age  Cardiovascular:  Rate and Rhythm: Normal rate and regular rhythm.  Pulmonary:  Effort: No accessory muscle usage or respiratory distress.  Abdominal:  Comments: Soft, nontender, nondistended, + bowel sounds  Musculoskeletal:  Comments: Left lower extremity Inspection: Left knee is flexed beyond 90 degrees and is essentially under the patient patient sitting upright in bed Left leg is somewhat dysmorphic No gross abnormalities noted to the lower leg ankle or foot. Patient has a relatively short appearing thigh which is symmetric to the contralateral side No erythema or knee effusion appreciated  Bony eval: Hip is nontender with palpation. No significant groin pain with evaluation Exquisite tenderness with palpation over her distal femur diffusely Lower leg, ankle and foot are nontender Attempted to manipulate the patient's left knee but there does appear to be fairly stiff and fixed contracture. I was able to possibly move her about 10 degrees of extension from her resting position before she has significant pain and block was appreciated  Soft tissue: All surgical wounds are well-healed Marked sites are noted over her distal thigh wound and bone stimulator No appreciable knee effusion noted No open wounds or lesions, no erythema to her thigh or knee Due to her flexion contracture very difficult to assess knee stability  ROM: Restricted due to knee contracture  Sensation: DPN, SPN, TN sensory functions grossly intact per patient's report. States that symmetric to the contralateral side  Motor: EHL, FHL, lesser toe motor functions are intact. Ankle flexion, extension, inversion and eversion are grossly intact as well  Vascular: Extremity is warm, palpable DP pulse Compartments are soft No significant swelling distally, no pitting edema Skin:  General: Skin is warm.  Neurological:   Mental Status: She is alert and oriented to person,  place, and time.  Comments: Unable to assess coordination and gait  Psychiatric:  Attention and Perception: Attention normal.  Behavior: Behavior is cooperative.   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 Risks and benefits discussed with the patient. Risks discussed included bleeding requiring blood transfusion, bleeding causing a hematoma, infection, malunion, nonunion, damage to surrounding nerves and blood vessels, pain, hardware prominence or irritation, hardware failure, stiffness, post-traumatic arthritis, DVT/PE, compartment syndrome, and even death. Patient and her husband agree to proceed with surgery. Consent was obtained.

## 2019-05-29 NOTE — Progress Notes (Signed)
Patient arrived with wet brief on. Brief removes and pure wick device applied.

## 2019-05-29 NOTE — Plan of Care (Signed)

## 2019-05-29 NOTE — Progress Notes (Signed)
RN called and updated pt's spouse, Jeneen Rinks, of pt status and plan of care. All questions answered to satisfaction. Will continue to monitor.

## 2019-05-29 NOTE — Op Note (Signed)
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 CPT 95188-CZYSAY of left femur nonunion 2. CPT 20680-Removal of hardware left femur 3. CPT 27430-Quadriplasty of left thigh 4. CPT 20245-Bone biopsy of left femur  Surgeons : Primary: Shona Needles, MD  Assistant: Patrecia Pace, PA-C  Location: OR 3   Anesthesia:General  Antibiotics: Ancef 2g preop   Tourniquet time:* No tourniquets in log *  Estimated Blood Loss: 301 mL  Complications: None   Specimens: ID Type Source Tests Collected by Time Destination  A : left femur non union site Tissue Soft Tissue, Other FUNGUS CULTURE WITH STAIN, AEROBIC/ANAEROBIC CULTURE (SURGICAL/DEEP WOUND), ACID FAST CULTURE WITH REFLEXED SENSITIVITIES, ACID FAST SMEAR (AFB) Haddix, Thomasene Lot, MD 05/29/2019 1009      Implants: Implant Name Type Inv. Item Serial No. Manufacturer Lot No. LRB No. Used Action  NAIL FEM RETRO 9X300MM - [id number] Nail NAIL FEM RETRO 9X300  [employer](ORTH,TRAU,BIO,SG) [id number] Left 1 Explanted  SCREW CORT TI DBLE LEAD 5X28 - [id number] Screw SCREW CORT TI DBLE LEAD 5X28  [employer](ORTH,TRAU,BIO,SG) [id number] Left 1 Explanted  SCREW 5X50MM - [id number] Screw SCREW CORT TI DBL LEAD 5X50  [employer](ORTH,TRAU,BIO,SG) [id number] Left 1 Explanted  SCREW CORT TI DBLE LEAD 5X28 - [id number] Screw SCREW CORT TI DBLE LEAD 5X28  [employer](ORTH,TRAU,BIO,SG) [id number] Left 1 Explanted  SCREW CORT TI DBLE LEAD 5X54 - [id number] Screw SCREW CORT TI DBLE LEAD 5X54  [employer](ORTH,TRAU,BIO,SG) [id number] Left 1 Explanted  SCREW CORT TI-DBLE LEAD 5X60MM - [id number] Screw SCREW CORT TI DBL LEAD 5X60  [employer](ORTH,TRAU,BIO,SG) [id number] Left 1 Explanted  PUTTY DBM STAGRAFT PLUS 10CC - [id number] Putty PUTTY DBM STAGRAFT PLUS 10CC  ZIMMER  RECON(ORTH,TRAU,BIO,SG) [id number] Left 1 Implanted  BONE [facility] CHIPS 40CC - [id number] Bone Implant BONE [facility] CHIPS 40CC [id number] [employer] [place] [employer]  Left 1 Implanted  PLATE DIST FEM 54M - [id number] Plate PLATE DIST FEM 93O  [employer](ORTH,TRAU,BIO,SG)  Left 1 Implanted  SCREW NCB 5.0X75MM - [id number] Screw SCREW NCB 5.0X75MM  [employer](ORTH,TRAU,BIO,SG)  Left 3 Implanted  SCREW 5.0 80MM - [id number] Screw SCREW 5.0 80MM  Z[employer](ORTH,TRAU,BIO,SG)  Left 1 Implanted  SCREW 5.0 70MM - [id number] Screw SCREW 5.0 70MM  Z[employer](ORTH,TRAU,BIO,SG)  Left 1 Implanted  SCREW NCB 5.0X38 - [id number] Screw SCREW NCB 5.0X38  [employer](ORTH,TRAU,BIO,SG)  Left 2 Implanted  SCREW NCB 5.0X36MM - [id number] Screw SCREW NCB 5.0X36MM  [employer](ORTH,TRAU,BIO,SG)  Left 1 Implanted  SCREW NCB 4.0X36MM - [id number] Screw SCREW NCB 4.0X36MM  [employer](ORTH,TRAU,BIO,SG)  Left 1 Implanted  CAP LOCK NCB - [id number] Cap CAP LOCK NCB  [employer](ORTH,TRAU,BIO,SG)  Left 8 Implanted  SCREW NCB 5.0X85MM - [id number] Screw SCREW NCB 5.0X85MM  [employer](ORTH,TRAU,BIO,SG)  Left 1 Implanted     Indications for Surgery: 58 year old female with extensive past medical history including diabetes, congestive heart failure, COPD, hypertension, hyperlipidemia who presents with a left femoral nonunion.  She had a intramedullary nailing of left distal femur fracture performed by Dr. Grandville Silos in November 2019.  She subsequently went on to nonunion as well as failed fixation.  She had been admitted multiple times for altered mental status.  She was effectively bedbound with inability to bend her knee.  She has a contracture of about 65 degrees with inability to straighten her knee.  She has not been able to ambulate has been bedbound.  CT scans have shown persistent nonunion that has failed nonoperative treatment with correction of vitamin D levels as well as institution of a bone stimulator.  Due to the  continued dysfunction I recommended proceeding with a repair of the left femoral nonunion with removal of hardware and placement of lateral based locking plate.  Risks and benefits were discussed with the patient and her husband.  Risks included but not limited to bleeding, infection, malunion, nonunion, persistent pain, stiffness, nerve and blood vessel injury, continued inability to bear weight, DVT, even the possibility loss of limb or life.  Patient agreed to proceed with surgery and consent was obtained.  Operative Findings: 1.  Left femoral nonunion treated with a removal of previous retrograde nail and placement of [employer]immer [employer] 12 hole NCB distal femoral locking plate. 2.  Severe knee flexion contracture that was treated with a quadriplasty, open arthrotomy with debridement of scar tissue and manipulation under anesthesia.  Postoperatively range of motion was from 5degrees to 100 degrees with no blocks to motion. 3.  Debridement of nonunion site with cultures sent for microbiology.  Placement of DBM bone matrix in addition to 40 cc of crushed cancellus allograft.  Procedure: The patient was identified in the preoperative holding area. Consent was confirmed with the patient and their family and all questions were answered. The operative extremity was marked after confirmation with the patient. she was then brought back to the operating room by our anesthesia colleagues.  She was carefully transferred over to a radiolucent flat top table.  She was placed under general anesthetic.  A bump was placed in her operative hip.The operative extremity was then prepped and draped in usual sterile fashion. A preoperative timeout was performed to verify the patient, the procedure, and the extremity. Preoperative antibiotics were dosed.  Fluoroscopic images showed persistent nonunion.  Her alignment was still appropriate.  However she did have some penetration of her distal nail through the trochlea.  Also noted  on the lateral radiograph was significant patella baja.  I first started out by removal of the distal interlocking screws.  These were able to be removed without great difficulty.  I then made a medial parapatellar approach to access the distal portion of the nail.  An extractor bolt was threaded into the distal nail.  Once this was tightened I then remove the proximal interlocks and successfully moved the nail without difficulty.  Through the medial parapatellar arthrotomy there was clear severe scar tissue surrounding the patellar tendon, patella as well as the quadriceps.  It is appeared that she has a scar down her quad to her leg with no motion at all.  As result I proceeded to excise a significant portion of scar tissue from the infrapatellar region as well as underneath the patella.  I was able to use a Cobb elevator to release the quadriceps tendon and quadriceps from the underlying femur and nonunion site.  I was able to mobilize the patella tendon well enough to manipulate the knee into about 100 degrees of flexion.  I then manipulated the knee in extension.  I was able to get the knee almost fully straight.  Once I was pleased with the release of the scar tissue through the medial parapatellar approach I turned my attention to the lateral approach for the distal femur plate.  I carried incision down through skin subcutaneous tissue.  I incised through the IT band in line with  my incision.  I reflected the vastus lateralis up to expose the distal femur and the nonunion site.  I carried my incision and dissection down until I was at the distal portion of the lateral condyle.  Using a curette and a ronguer I debrided the nonunion site.  This tissue was sent for culture as noted above.  Once I was pleased with the debridement I turned my attention to the fixation portion.  I chose a 12 hole NCB distal femoral locking plate.  I placed it to the jig.  I aligned it appropriately distally and slid it  submuscularly along the lateral cortex of the femur.  A K wire was used distally to hold the position and a percutaneous incision was placed and a 3.3 mm drill bit was used to drill bicortically at the proximal portion of the femur.  3.3 mm drill bit was used to drill and placed 5.0 millimeter screws.  This was performed distally.  A total of 3 screws were placed distally.  I then returned to the shaft and placed a total of 4 screws in the shaft.  The most proximal one being a 4.0 millimeter screw.  The remainder were 5.0 millimeter screws.  Locking caps were placed on the shaft screws except for the most proximal screw.  I returned distally to place a total of 6 screws and an awl.  Locking caps were placed after the jig was removed.  Alignment was appropriate.  A decision was made whether or not to perform a medial plate as well.  Due to the 2 large incisions that I already made as well as the significant medical problems I chose to forego a medially based plate and just used a lateral baseplate.  I felt my fixation was strong and much improved from the previous nail.  Through the nonunion site I placed a mixture of DBM and crushed cancellous allograft.  This was after I thoroughly irrigated both incisions.  Final fluoroscopic images were obtained.  A gram of vancomycin powder 1.2 g of tobramycin powder were placed between the 2 incisions.  The IT band was closed with a 0 Vicryl suture.  The medial parapatellar incision was closed with 0 Vicryl suture.  The skin was closed with 2-0 Vicryl and 3-0 nylon.  Sterile dressing consisting of Mepilex as well as sterile cast padding and an Ace wrap was placed.  The patient was then awoken from anesthesia and taken to the PACU in stable condition.  Post Op Plan/Instructions: The patient will be touchdown weightbearing to left lower extremity.  She will be started on CPM to increase her knee range of motion.  She will be on Lovenox for DVT prophylaxis.  She will  mobilize with physical and Occupational Therapy.  We will place her on broad-spectrum antibiotics until cultures return.   I was present and performed the entire surgery.  Patrecia Pace, PA-C did assist me throughout the case. An assistant was necessary given the difficulty in approach, maintenance of reduction and ability to instrument the fracture.   Katha Hamming, MD [facility]

## 2019-05-29 NOTE — Anesthesia Postprocedure Evaluation (Signed)
Anesthesia Post Note  Patient: Sandra Ferrell  Procedure(s) Performed: REPAIR OF LEFT FEMUR NONUNION (Left Leg Upper) REMOVAL OF  LEFT FEMUR HARDWARE (Left Leg Upper)     Patient location during evaluation: PACU Anesthesia Type: General Level of consciousness: awake and alert Pain management: pain level controlled Vital Signs Assessment: post-procedure vital signs reviewed and stable Respiratory status: spontaneous breathing, nonlabored ventilation and respiratory function stable Cardiovascular status: blood pressure returned to baseline and stable Postop Assessment: no apparent nausea or vomiting Anesthetic complications: no    Last Vitals:  Vitals:   05/29/19 1145 05/29/19 1200  BP: (!) 121/55 (!) 109/59  Pulse: 90 86  Resp: 16 15  Temp:    SpO2: 93% 93%    Last Pain:  Vitals:   05/29/19 1200  TempSrc:   PainSc: [place]

## 2019-05-29 NOTE — Progress Notes (Signed)
Orthopedic Tech Progress Note Patient Details:  Sandra Ferrell [birth year] [id number]  CPM Left Knee CPM Left Knee: Other (Comment) Additional Comments: delivered to room. pt did not want to get in cpm due to pain.     Karolee Stamps 05/29/2019, 3:35 PM

## 2019-05-29 NOTE — Progress Notes (Signed)
Pharmacy Antibiotic Note  Sandra Ferrell is a 58 y.o. female admitted on 05/29/2019 with wound infection.  Pharmacy has been consulted for vancomycin dosing.  Patient is s/p surgeries involving femur. Per surgery, starting broad spectrum abx until cultures from surgery return. Creatinine 0.71 (baseline ~0.7), temperature 36.3, and WBC 5.8.   Plan: Vancomycin 1750 IV x one dose today as loading dose Vancomycin 1000 mg IV Q 24 hrs. Goal AUC 400-550. Expected AUC: 519 SCr used: 0.8 Check levels at steady state.  Monitor creatinine, cultures, and clinical improvement.   Height: 4\' 10"  (147.3 cm) Weight: 189 lb (85.7 kg) IBW/kg (Calculated) : 40.9  Temp (24hrs), Avg:97.8 F (36.6 C), Min:97.4 F (36.3 C), Max:98.4 F (36.9 C)  Recent Labs  Lab 05/29/19 0642  WBC 5.8  CREATININE 0.71    Estimated Creatinine Clearance: 70.3 mL/min (by C-G formula based on SCr of 0.71 mg/dL).    Allergies  Allergen Reactions  . Benazepril Anaphylaxis, Swelling and Other (See Comments)    Angioedema, throat swelling  . Diclofenac Sodium Other (See Comments)    GI bleed  . Fluticasone-Salmeterol Hives, Itching and Swelling    Tongue was swollen  . Nsaids Other (See Comments)    Rectal bleeding  . Penicillins Hives and Rash    Did it involve swelling of the face/tongue/throat, SOB, or low BP? No Did it involve sudden or severe rash/hives, skin peeling, or any reaction on the inside of your mouth or nose? No Did you need to seek medical attention at a hospital or doctor's office? No When did it last happen?5 - 6 years If all above answers are "NO", may proceed with cephalosporin use.   Marland Kitchen Morphine And Related     rash  . Aspirin Other (See Comments)    Burns stomach  . Propoxyphene Other (See Comments)    Darvocet - Headache    Antimicrobials this admission: cefazolin x 1 for pre-op   Ceftriaxone 7/24 >> (stop date 7/26) vancomycin 7/24 >>   Dose adjustments this  admission: N/A  Microbiology results: 7/24 Covid negative  Acid fast 7/24: pending Wound 7/24: pending   Thank you for allowing pharmacy to be a part of this patient's care.  Eddie Candle, PharmD PGY-1 Pharmacy Resident  Phone: [phone]

## 2019-05-29 NOTE — Transfer of Care (Signed)
Immediate Anesthesia Transfer of Care Note  Patient: Sandra Ferrell  Procedure(s) Performed: REPAIR OF LEFT FEMUR NONUNION (Left Leg Upper) REMOVAL OF  LEFT FEMUR HARDWARE (Left Leg Upper)  Patient Location: PACU  Anesthesia Type:General  Level of Consciousness: awake, alert  and oriented  Airway & Oxygen Therapy: Patient Spontanous Breathing and Patient connected to face mask oxygen  Post-op Assessment: Report given to RN, Post -op Vital signs reviewed and stable and Patient moving all extremities X 4  Post vital signs: Reviewed and stable  Last Vitals:  Vitals Value Taken Time  BP 118/88 05/29/19 1137  Temp 36.4 C 05/29/19 1137  Pulse 90 05/29/19 1144  Resp 16 05/29/19 1144  SpO2 94 % 05/29/19 1144  Vitals shown include unvalidated device data.  Last Pain:  Vitals:   05/29/19 1137  TempSrc:   PainSc: Asleep      Patients Stated Pain Goal: 4 (14/99/69 2493)  Complications: No apparent anesthesia complications

## 2019-05-29 NOTE — Progress Notes (Signed)
Pt arrived to room 5N12 via bed after surgery. Received report from [place], RN in PACU. See assessment. Will continue to monitor.

## 2019-05-29 NOTE — Anesthesia Procedure Notes (Signed)
Procedure Name: Intubation Date/Time: 05/29/2019 7:51 AM Performed by: Mariea Clonts, CRNA Pre-anesthesia Checklist: Patient identified, Emergency Drugs available, Suction available and Patient being monitored Patient Re-evaluated:Patient Re-evaluated prior to induction Oxygen Delivery Method: Circle System Utilized Preoxygenation: Pre-oxygenation with 100% oxygen Induction Type: IV induction Ventilation: Mask ventilation without difficulty Laryngoscope Size: Miller and 2 Grade View: Grade I Tube type: Oral Tube size: 7.0 mm Number of attempts: 1 Airway Equipment and Method: Stylet and Oral airway Placement Confirmation: ETT inserted through vocal cords under direct vision,  positive ETCO2 and breath sounds checked- equal and bilateral Tube secured with: Tape Dental Injury: Teeth and Oropharynx as per pre-operative assessment

## 2019-05-30 LAB — CBC
HCT: 20.8 % — ABNORMAL LOW (ref 36.0–46.0)
Hemoglobin: 6.6 g/dL — CL (ref 12.0–15.0)
MCH: 27.7 pg (ref 26.0–34.0)
MCHC: 31.7 g/dL (ref 30.0–36.0)
MCV: 87.4 fL (ref 80.0–100.0)
Platelets: 305 10*3/uL (ref 150–400)
RBC: 2.38 MIL/uL — ABNORMAL LOW (ref 3.87–5.11)
RDW: 15.2 % (ref 11.5–15.5)
WBC: 6.2 10*3/uL (ref 4.0–10.5)
nRBC: 0 % (ref 0.0–0.2)

## 2019-05-30 LAB — ACID FAST SMEAR (AFB, MYCOBACTERIA): Acid Fast Smear: NEGATIVE

## 2019-05-30 LAB — GLUCOSE, CAPILLARY
Glucose-Capillary: 140 mg/dL — ABNORMAL HIGH (ref 70–99)
Glucose-Capillary: 135 mg/dL — ABNORMAL HIGH (ref 70–99)
Glucose-Capillary: 128 mg/dL — ABNORMAL HIGH (ref 70–99)
Glucose-Capillary: 151 mg/dL — ABNORMAL HIGH (ref 70–99)

## 2019-05-30 LAB — BASIC METABOLIC PANEL
Anion gap: 9 (ref 5–15)
BUN: 22 mg/dL — ABNORMAL HIGH (ref 6–20)
CO2: 24 mmol/L (ref 22–32)
Calcium: 7.7 mg/dL — ABNORMAL LOW (ref 8.9–10.3)
Chloride: 100 mmol/L (ref 98–111)
Creatinine, Ser: 0.82 mg/dL (ref 0.44–1.00)
GFR calc Af Amer: >60 mL/min (ref >60)
GFR calc non Af Amer: >60 mL/min (ref >60)
Glucose, Bld: 156 mg/dL — ABNORMAL HIGH (ref 70–99)
Potassium: 3.8 mmol/L (ref 3.5–5.1)
Sodium: 133 mmol/L — ABNORMAL LOW (ref 135–145)

## 2019-05-30 LAB — PREPARE RBC (CROSSMATCH)

## 2019-05-30 MED ORDER — SODIUM CHLORIDE 0.9% IV SOLUTION
Freq: Once | INTRAVENOUS | Status: AC
  Administered 2019-05-30: 16:00:00 via INTRAVENOUS

## 2019-05-30 NOTE — Progress Notes (Addendum)
Patient ID: Sandra Ferrell, female   DOB: 1961-03-22, 58 y.o.   MRN: [id number]   LOS: 1 day   Subjective: Having some pain but pain medicine effective. Running a low-grade fever. Has not been in CPM yet.   Objective: Vital signs in last 24 hours: Temp:  [97.4 F (36.3 C)-100.1 F (37.8 C)] 100.1 F (37.8 C) (07/25 0744) Pulse Rate:  [84-111] 101 (07/25 0744) Resp:  [14-19] 16 (07/25 0744) BP: (108-139)/(48-88) 113/52 (07/25 0744) SpO2:  [92 %-98 %] 92 % (07/25 0714) Last BM Date: 05/28/19   Laboratory  CBC Recent Labs    05/29/19 0642 05/30/19 0445  WBC 5.8 6.2  HGB 8.7* 6.6*  HCT 28.8* 20.8*  PLT 377 305   BMET Recent Labs    05/29/19 0642 05/30/19 0445  NA 135 133*  K 3.2* 3.8  CL 100 100  CO2 25 24  GLUCOSE 207* 156*  BUN 18 22*  CREATININE 0.71 0.82  CALCIUM 8.0* 7.7*     Physical Exam General appearance: alert and no distress  LLE: Sensation intact, EHL 5/5    Assessment/Plan: Left femur nonunion s/p ORIF -- TDWB, CPM to start today. Fever likely 2/2 ATX, encouraged IS use.    Lisette Abu, PA-C Orthopedic Surgery [phone] 05/30/2019

## 2019-05-30 NOTE — Evaluation (Signed)
Physical Therapy Evaluation Patient Details Name: Sandra Ferrell MRN: [id number] DOB: 04-28-1961 Today's Date: 05/30/2019   History of Present Illness  58 yo F admitted for Left femur non-union and had removal of hardware with ORIF on 05/29/2019.  Pt with extensive PMH including but not limited to COPD, depression, fibromyalgia, HTN, OA, chronic pain syndrome, and irritable bowel syndrome.   Clinical Impression  Pt admitted with above diagnosis. Pt currently with functional limitations due to the deficits listed below (see PT Problem List). Pt will benefit from skilled PT to increase their independence and progress will likely be slow.  Pt was bedbound prior to admit and hasn't really walked much since original LLE surgery in August 2019.  Pt wants to return home, not interested in SNF.     Follow Up Recommendations Home health PT;Supervision for mobility/OOB    Equipment Recommendations  Other (comment)(May need mechanical lift (hoyer))    Recommendations for Other Services       Precautions / Restrictions Precautions Precautions: Fall Restrictions Weight Bearing Restrictions: Yes      Mobility  Bed Mobility Overal bed mobility: Needs Assistance Bed Mobility: Rolling Rolling: Max assist;+2 for safety/equipment         General bed mobility comments: Pt needed to use bedpan and therefore it was placed at end of session.   Transfers Overall transfer level: (Unable)                  Ambulation/Gait                Stairs            Wheelchair Mobility    Modified Rankin (Stroke Patients Only)       Balance                                             Pertinent Vitals/Pain Pain Assessment: 0-10 Pain Score: 8  Pain Location: Left Leg Pain Intervention(s): Limited activity within patient's tolerance;Premedicated before session;Monitored during session    Sandra Ferrell expects to be discharged to:: Private  residence Living Arrangements: Spouse/significant other Available Help at Discharge: Family;Available 24 hours/day Type of Home: House Home Access: Stairs to enter Entrance Stairs-Rails: None Entrance Stairs-Number of Steps: 1 Home Layout: One level Home Equipment: Walker - 2 wheels;Cane - single point;Wheelchair - manual;Hospital bed      Prior Function Level of Independence: Needs assistance   Gait / Transfers Assistance Needed: (Pt reports she has been bedbound at home. )           Hand Dominance   Dominant Hand: Left    Extremity/Trunk Assessment   Upper Extremity Assessment Upper Extremity Assessment: Defer to OT evaluation    Lower Extremity Assessment Lower Extremity Assessment: RLE deficits/detail;LLE deficits/detail RLE Deficits / Details: AROM limited by weakness and edema throughout RLE LLE Deficits / Details: L knee flexion contracture. Pt very gaurded with any movement to LLE. Able to do ankle pumps LLE: Unable to fully assess due to pain    Cervical / Trunk Assessment Cervical / Trunk Assessment: Other exceptions(unable to assess in supine)  Communication   Communication: No difficulties  Cognition Arousal/Alertness: Awake/alert Behavior During Therapy: WFL for tasks assessed/performed Overall Cognitive Status: Within Functional Limits for tasks assessed  General Comments General comments (skin integrity, edema, etc.): No family present.    Exercises     Assessment/Plan    PT Assessment Patient needs continued PT services  PT Problem List Decreased strength;Decreased range of motion;Decreased activity tolerance;Decreased balance;Decreased mobility;Pain       PT Treatment Interventions DME instruction;Therapeutic activities;Therapeutic exercise;Balance training;Patient/family education    PT Goals (Current goals can be found in the Care Plan section)  Acute Rehab PT Goals Patient Stated  Goal: to eventually walk again. wants to work on bed mobility in the hospital.  PT Goal Formulation: With patient Time For Goal Achievement: 06/06/19 Potential to Achieve Goals: Good    Frequency Min 2X/week   Barriers to discharge        Co-evaluation               AM-PAC PT "6 Clicks" Mobility  Outcome Measure Help needed turning from your back to your side while in a flat bed without using bedrails?: A Lot Help needed moving from lying on your back to sitting on the side of a flat bed without using bedrails?: Total Help needed moving to and from a bed to a chair (including a wheelchair)?: Total Help needed standing up from a chair using your arms (e.g., wheelchair or bedside chair)?: Total Help needed to walk in hospital room?: Total Help needed climbing 3-5 steps with a railing? : Total 6 Click Score: 7    End of Session Equipment Utilized During Treatment: Oxygen Activity Tolerance: Patient limited by pain Patient left: in bed;with bed alarm set Nurse Communication: Other (comment)(NT aware bedpan placed under pt. ) PT Visit Diagnosis: Difficulty in walking, not elsewhere classified (R26.2)    Time: 0017-4944 PT Time Calculation (min) (ACUTE ONLY): 29 min   Charges:   PT Evaluation $PT Eval Moderate Complexity: 1 Mod PT Treatments $Therapeutic Activity: 8-22 mins        Sandra Ferrell, PT   Acute Rehabilitation Services  Pager [phone] Office [phone] 05/30/2019   Sandra Ferrell 05/30/2019, 1:05 PM

## 2019-05-31 LAB — BASIC METABOLIC PANEL
Anion gap: 8 (ref 5–15)
BUN: 18 mg/dL (ref 6–20)
CO2: 26 mmol/L (ref 22–32)
Calcium: 7.7 mg/dL — ABNORMAL LOW (ref 8.9–10.3)
Chloride: 101 mmol/L (ref 98–111)
Creatinine, Ser: 0.7 mg/dL (ref 0.44–1.00)
GFR calc Af Amer: >60 mL/min (ref >60)
GFR calc non Af Amer: >60 mL/min (ref >60)
Glucose, Bld: 156 mg/dL — ABNORMAL HIGH (ref 70–99)
Potassium: 3 mmol/L — ABNORMAL LOW (ref 3.5–5.1)
Sodium: 135 mmol/L (ref 135–145)

## 2019-05-31 LAB — PREPARE RBC (CROSSMATCH)

## 2019-05-31 LAB — HEMOGLOBIN AND HEMATOCRIT, BLOOD
HCT: 29.3 % — ABNORMAL LOW (ref 36.0–46.0)
Hemoglobin: 9.5 g/dL — ABNORMAL LOW (ref 12.0–15.0)

## 2019-05-31 LAB — GLUCOSE, CAPILLARY
Glucose-Capillary: 130 mg/dL — ABNORMAL HIGH (ref 70–99)
Glucose-Capillary: 133 mg/dL — ABNORMAL HIGH (ref 70–99)
Glucose-Capillary: 137 mg/dL — ABNORMAL HIGH (ref 70–99)
Glucose-Capillary: 160 mg/dL — ABNORMAL HIGH (ref 70–99)

## 2019-05-31 LAB — CBC
HCT: 22.1 % — ABNORMAL LOW (ref 36.0–46.0)
Hemoglobin: 6.9 g/dL — CL (ref 12.0–15.0)
MCH: 27.7 pg (ref 26.0–34.0)
MCHC: 31.2 g/dL (ref 30.0–36.0)
MCV: 88.8 fL (ref 80.0–100.0)
Platelets: 278 10*3/uL (ref 150–400)
RBC: 2.49 MIL/uL — ABNORMAL LOW (ref 3.87–5.11)
RDW: 14.6 % (ref 11.5–15.5)
WBC: 6.7 10*3/uL (ref 4.0–10.5)
nRBC: 0.6 % — ABNORMAL HIGH (ref 0.0–0.2)

## 2019-05-31 MED ORDER — SODIUM CHLORIDE 0.9% IV SOLUTION
Freq: Once | INTRAVENOUS | Status: AC
  Administered 2019-05-31: 08:00:00 via INTRAVENOUS

## 2019-05-31 MED ORDER — POTASSIUM CHLORIDE CRYS ER 20 MEQ PO TBCR
40.0000 meq | EXTENDED_RELEASE_TABLET | Freq: Every day | ORAL | Status: DC
  Administered 2019-05-31 – 2019-06-03 (×4): 40 meq via ORAL
  Filled 2019-05-31 (×4): qty 2

## 2019-05-31 NOTE — Progress Notes (Signed)
RN notified attending MD about patient's hgb of 6.9. Patient asymptomatic. Awaiting further instructions.

## 2019-05-31 NOTE — Plan of Care (Signed)

## 2019-05-31 NOTE — Progress Notes (Addendum)
Patient was about to start CPM today however patient is unable to straighten her leg. Patient in too much pain when tried to. Patient has left knee flexion contracture. Will notify MD.

## 2019-05-31 NOTE — Progress Notes (Signed)
Orthopaedic Trauma Progress Note  S: Patient has not been using CPM machine. Hgb this AM low again after transfusion yesterday. States therapy came by but only rolled her in bed. Having pain in her leg  O:  Vitals:   05/30/19 2033 05/31/19 0553  BP: (!) 91/42 (!) 103/43  Pulse: 98 97  Resp: 15 16  Temp: 97.7 F (36.5 C) 100.2 F (37.9 C)  SpO2: 98% 96%    Gen: NAD, AAOx3 LLE: Flexion contracture present. Does not tolerate motion of knee able to get about 30-40 short of full extension. Dressing clean, dry and intact. Neurovascularly intact  Imaging: Stable post op imaging  Labs:  Results for orders placed or performed during the hospital encounter of 05/29/19 (from the past 24 hour(s))  Glucose, capillary     Status: Abnormal   Collection Time: 05/30/19  7:45 AM  Result Value Ref Range   Glucose-Capillary 140 (H) 70 - 99 mg/dL  Glucose, capillary     Status: Abnormal   Collection Time: 05/30/19 12:20 PM  Result Value Ref Range   Glucose-Capillary 135 (H) 70 - 99 mg/dL  Prepare RBC     Status: None   Collection Time: 05/30/19  3:12 PM  Result Value Ref Range   Order Confirmation      ORDER PROCESSED BY BLOOD BANK Performed at [facility], [address], [place], [place] [address]   Glucose, capillary     Status: Abnormal   Collection Time: 05/30/19  3:56 PM  Result Value Ref Range   Glucose-Capillary 128 (H) 70 - 99 mg/dL  Glucose, capillary     Status: Abnormal   Collection Time: 05/30/19  9:35 PM  Result Value Ref Range   Glucose-Capillary 151 (H) 70 - 99 mg/dL  Basic metabolic panel     Status: Abnormal   Collection Time: 05/31/19  3:46 AM  Result Value Ref Range   Sodium 135 135 - 145 mmol/L   Potassium 3.0 (L) 3.5 - 5.1 mmol/L   Chloride 101 98 - 111 mmol/L   CO2 26 22 - 32 mmol/L   Glucose, Bld 156 (H) 70 - 99 mg/dL   BUN 18 6 - 20 mg/dL   Creatinine, Ser 0.70 0.44 - 1.00 mg/dL   Calcium 7.7 (L) 8.9 - 10.3 mg/dL   GFR calc non Af Amer >60 >60  mL/min   GFR calc Af Amer >60 >60 mL/min   Anion gap 8 5 - 15  CBC     Status: Abnormal   Collection Time: 05/31/19  3:46 AM  Result Value Ref Range   WBC 6.7 4.0 - 10.5 K/uL   RBC 2.49 (L) 3.87 - 5.11 MIL/uL   Hemoglobin 6.9 (LL) 12.0 - 15.0 g/dL   HCT 22.1 (L) 36.0 - 46.0 %   MCV 88.8 80.0 - 100.0 fL   MCH 27.7 26.0 - 34.0 pg   MCHC 31.2 30.0 - 36.0 g/dL   RDW 14.6 11.5 - 15.5 %   Platelets 278 150 - 400 K/uL   nRBC 0.6 (H) 0.0 - 0.2 %  Prepare RBC     Status: None   Collection Time: 05/31/19  7:07 AM  Result Value Ref Range   Order Confirmation      ORDER PROCESSED BY BLOOD BANK already have 2 units available. Performed at [facility], [place] [address], [place], [place] [address]     Assessment: Left femur nonunion s/p repair   Weightbearing: TDWB LLE  Insicional and dressing care: Dressing change tomorrow  Orthopedic device(s):Discontinue CPM, try to get knee straight today  CV/Blood loss:Hgb 6.9 this AM, plan for transfusion with 2 units PRBCs  Pain management: 1. Tylenol 1000mg  q 6 hours 2. Gabapentin 300 mg TID 3. Dilaudid 1 mg q 3 hours PRN 4. Robaxin 500mg  q 6 hours PRN 5. Oxycodone 5-15 mg q 4hours  VTE prophylaxis: Lovenox 40 mg daily  ID: Vanc and ceftriaxone until cultures return negative x72 hrs. No growth to date currently  FEN: Low K, plan for repletion today  Medical co-morbidities: Multiple medical problems-stable on home meds  Impediments to Fracture Healing: Severe vitamin D deficiency-5000 units vitamin D3 daily  Dispo: Home with [place] [facility] once medically stable  Follow - up plan: TBD   Shona Needles, MD [facility] [phone] (phone) [phone] (office) [email or web address]

## 2019-05-31 NOTE — Progress Notes (Signed)
Orthopedic Tech Progress Note Patient Details:  Sandra Ferrell [birth year] [id number]  Patient ID: [name], female   DOB: 1961-03-29, 58 y.o.   MRN: [id number] I tried to apply the cpm this morning. The pt was unable to straighten their leg even with my assistance. I alerted the rn to this fact.  Karolee Stamps 05/31/2019, 6:19 AM

## 2019-06-01 ENCOUNTER — Encounter (HOSPITAL_COMMUNITY): Payer: Self-pay | Admitting: Student

## 2019-06-01 LAB — BPAM RBC
Blood Product Expiration Date: 202008142359
Blood Product Expiration Date: 202008152359
Blood Product Expiration Date: 202008172359
ISSUE DATE / TIME: 202007251537
ISSUE DATE / TIME: 202007260728
ISSUE DATE / TIME: 202007261137
Unit Type and Rh: 6200
Unit Type and Rh: 6200
Unit Type and Rh: 6200

## 2019-06-01 LAB — BASIC METABOLIC PANEL
Anion gap: 9 (ref 5–15)
BUN: 16 mg/dL (ref 6–20)
CO2: 25 mmol/L (ref 22–32)
Calcium: 7.7 mg/dL — ABNORMAL LOW (ref 8.9–10.3)
Chloride: 100 mmol/L (ref 98–111)
Creatinine, Ser: 0.62 mg/dL (ref 0.44–1.00)
GFR calc Af Amer: >60 mL/min (ref >60)
GFR calc non Af Amer: >60 mL/min (ref >60)
Glucose, Bld: 177 mg/dL — ABNORMAL HIGH (ref 70–99)
Potassium: 3.2 mmol/L — ABNORMAL LOW (ref 3.5–5.1)
Sodium: 134 mmol/L — ABNORMAL LOW (ref 135–145)

## 2019-06-01 LAB — TYPE AND SCREEN
ABO/RH(D): A POS
Antibody Screen: NEGATIVE
Unit division: 0
Unit division: 0
Unit division: 0

## 2019-06-01 LAB — CBC
HCT: 27.8 % — ABNORMAL LOW (ref 36.0–46.0)
Hemoglobin: 8.9 g/dL — ABNORMAL LOW (ref 12.0–15.0)
MCH: 28.5 pg (ref 26.0–34.0)
MCHC: 32 g/dL (ref 30.0–36.0)
MCV: 89.1 fL (ref 80.0–100.0)
Platelets: 280 10*3/uL (ref 150–400)
RBC: 3.12 MIL/uL — ABNORMAL LOW (ref 3.87–5.11)
RDW: 14.9 % (ref 11.5–15.5)
WBC: 7.2 10*3/uL (ref 4.0–10.5)
nRBC: 0 % (ref 0.0–0.2)

## 2019-06-01 LAB — POCT I-STAT 4, (NA,K, GLUC, HGB,HCT)
Glucose, Bld: 143 mg/dL — ABNORMAL HIGH (ref 70–99)
HCT: 26 % — ABNORMAL LOW (ref 36.0–46.0)
Hemoglobin: 8.8 g/dL — ABNORMAL LOW (ref 12.0–15.0)
Potassium: 3.3 mmol/L — ABNORMAL LOW (ref 3.5–5.1)
Sodium: 138 mmol/L (ref 135–145)

## 2019-06-01 LAB — GLUCOSE, CAPILLARY
Glucose-Capillary: 140 mg/dL — ABNORMAL HIGH (ref 70–99)
Glucose-Capillary: 160 mg/dL — ABNORMAL HIGH (ref 70–99)
Glucose-Capillary: 172 mg/dL — ABNORMAL HIGH (ref 70–99)
Glucose-Capillary: 169 mg/dL — ABNORMAL HIGH (ref 70–99)

## 2019-06-01 MED ORDER — DM-GUAIFENESIN ER 30-600 MG PO TB12
1.0000 | ORAL_TABLET | Freq: Two times a day (BID) | ORAL | Status: DC
  Administered 2019-06-01 – 2019-06-03 (×5): 1 via ORAL
  Filled 2019-06-01 (×5): qty 1

## 2019-06-01 NOTE — Care Management Important Message (Signed)
Important Message  Patient Details  Name: Sandra Ferrell MRN: [id number] Date of Birth: Jun 30, 1961   Medicare Important Message Given:  Yes     Memory Argue 06/01/2019, 3:11 PM

## 2019-06-01 NOTE — Plan of Care (Signed)
  Problem: Education: Goal: Knowledge of General Education information will improve Description: Including pain rating scale, medication(s)/side effects and non-pharmacologic comfort measures Outcome: Progressing   Problem: Health Behavior/Discharge Planning: Goal: Ability to manage health-related needs will improve Outcome: Progressing   Problem: Clinical Measurements: Goal: Will remain free from infection Outcome: Progressing Goal: Diagnostic test results will improve Outcome: Progressing Goal: Cardiovascular complication will be avoided Outcome: Progressing

## 2019-06-01 NOTE — Progress Notes (Addendum)
Orthopaedic Trauma Progress Note  S: Patient has not been using CPM machine, have discontinued it. Hgb this AM doing better after 3 unit PRBCs over the weekend. States therapy came by but only rolled her in bed. Having pain in her leg. States she is having some wheezing this morning, asking for Mucinex DM  O:  Vitals:   05/31/19 2008 06/01/19 0502  BP: 132/61 (!) 124/54  Pulse: (!) 103 98  Resp: 16 16  Temp: 98.8 F (37.1 C) 98.4 F (36.9 C)  SpO2: 93% 91%    Gen: NAD, AAOx3 Cardiac: Heart RRR  Respiratory: No increased work of breathing. Wheezes bilaterally throughout lung fields R worse then left LLE: Flexion contracture present. Does not tolerate motion of knee. Able to get about 30-40 short of full extension. Dressing removed, incisions are clean, dry and intact. Swelling in foot and lower leg present. Neurovascularly intact  Imaging: Stable post op imaging  Labs:  Results for orders placed or performed during the hospital encounter of 05/29/19 (from the past 24 hour(s))  Glucose, capillary     Status: Abnormal   Collection Time: 05/31/19 11:46 AM  Result Value Ref Range   Glucose-Capillary 133 (H) 70 - 99 mg/dL  Glucose, capillary     Status: Abnormal   Collection Time: 05/31/19  4:18 PM  Result Value Ref Range   Glucose-Capillary 137 (H) 70 - 99 mg/dL  Glucose, capillary     Status: Abnormal   Collection Time: 05/31/19  9:39 PM  Result Value Ref Range   Glucose-Capillary 160 (H) 70 - 99 mg/dL  Hemoglobin and hematocrit, blood     Status: Abnormal   Collection Time: 05/31/19 10:24 PM  Result Value Ref Range   Hemoglobin 9.5 (L) 12.0 - 15.0 g/dL   HCT 29.3 (L) 36.0 - 40.9 %  Basic metabolic panel     Status: Abnormal   Collection Time: 06/01/19  4:04 AM  Result Value Ref Range   Sodium 134 (L) 135 - 145 mmol/L   Potassium 3.2 (L) 3.5 - 5.1 mmol/L   Chloride 100 98 - 111 mmol/L   CO2 25 22 - 32 mmol/L   Glucose, Bld 177 (H) 70 - 99 mg/dL   BUN 16 6 - 20 mg/dL   Creatinine, Ser 0.62 0.44 - 1.00 mg/dL   Calcium 7.7 (L) 8.9 - 10.3 mg/dL   GFR calc non Af Amer >60 >60 mL/min   GFR calc Af Amer >60 >60 mL/min   Anion gap 9 5 - 15  CBC     Status: Abnormal   Collection Time: 06/01/19  4:04 AM  Result Value Ref Range   WBC 7.2 4.0 - 10.5 K/uL   RBC 3.12 (L) 3.87 - 5.11 MIL/uL   Hemoglobin 8.9 (L) 12.0 - 15.0 g/dL   HCT 27.8 (L) 36.0 - 46.0 %   MCV 89.1 80.0 - 100.0 fL   MCH 28.5 26.0 - 34.0 pg   MCHC 32.0 30.0 - 36.0 g/dL   RDW 14.9 11.5 - 15.5 %   Platelets 280 150 - 400 K/uL   nRBC 0.0 0.0 - 0.2 %  Glucose, capillary     Status: Abnormal   Collection Time: 06/01/19  7:59 AM  Result Value Ref Range   Glucose-Capillary 140 (H) 70 - 99 mg/dL    Assessment: Left femur nonunion s/p repair   Weightbearing: TDWB LLE  Insicional and dressing care: Dressing changed today. Change PRN  Orthopedic device(s):Discontinue CPM, try to get knee  straight today  CV/Blood loss: Hgb 8.9 this AM, up from yesterday. Received 3 units PRBCs. Hemodynamically stable  Pain management: 1. Tylenol 1000mg  q 6 hours 2. Gabapentin 300 mg TID 3. Dilaudid 1 mg q 3 hours PRN 4. Robaxin 500mg  q 6 hours PRN 5. Oxycodone 5-15 mg q 4hours  VTE prophylaxis: Lovenox 40 mg daily  ID: Vanc and ceftriaxone until cultures return negative x72 hrs. No growth to date currently  FEN: Low K, plan for repletion today  Medical co-morbidities: Multiple medical problems-stable on home meds  Impediments to Fracture Healing: Severe vitamin D deficiency-5000 units vitamin D3 daily  Dispo: Home with [place] PT once medically stable  Follow - up plan: TBD   Arnav Cregg A. Carmie Kanner [facility] ?[phone]? (phone)

## 2019-06-01 NOTE — Progress Notes (Signed)
Physical Therapy Treatment Patient Details Name: Sandra Ferrell MRN: [id number] DOB: 05/08/1961 Today's Date: 06/01/2019    History of Present Illness 58 yo F admitted for Left femur non-union and had removal of hardware with ORIF on 05/29/2019.  Pt with extensive PMH including but not limited to COPD, depression, fibromyalgia, HTN, OA, chronic pain syndrome, and irritable bowel syndrome.     PT Comments    Pt performed bed mobility and seated balance edge of bed with max assistance and max cues for encouragement.  She continues to present with L knee flexion contracture and LE positioned to promote extension post session.  Pt sat edge of bed x 4 min and slightly resistive to movements due to fear.  Plan for return home as pt declining placement.  Will continue to perform tx to improve extension of L knee and increased activity tolerance in seated position.      Follow Up Recommendations  Home health PT;Supervision for mobility/OOB     Equipment Recommendations  Other (comment)(mechanical lift for home use)    Recommendations for Other Services       Precautions / Restrictions Precautions Precautions: Fall Restrictions Weight Bearing Restrictions: Yes LLE Weight Bearing: Non weight bearing    Mobility  Bed Mobility Overal bed mobility: Needs Assistance Bed Mobility: Supine to Sit;Sit to Supine     Supine to sit: Max assist Sit to supine: Max assist;+2 for physical assistance   General bed mobility comments: Pt able to move to edge of bed with max assistance from PTA to advance LEs and elevate trunk into sitting.  Pt sat edge of bed x 4 min with max VCs to maintain midline and avoid posterior lean.  To return to bed pt required increased assistance to lower trunk and L B LEs back to bed.  Once in supine pt boosted to [facility] with +2 total assistance with bed tilted to improve ease.  Pt is very fearful during mobilization.  Transfers                    Ambulation/Gait                  Stairs             Wheelchair Mobility    Modified Rankin (Stroke Patients Only)       Balance                                            Cognition Arousal/Alertness: Awake/alert Behavior During Therapy: WFL for tasks assessed/performed Overall Cognitive Status: Within Functional Limits for tasks assessed                                        Exercises Other Exercises Other Exercises: L HS stretch 3x 30 sec, followed by positioning to avoid ER and promote knee extension.  Reduced extension from lacking 40 degrees to lacking 20 degrees.    General Comments        Pertinent Vitals/Pain Pain Assessment: 0-10 Pain Score: 8  Pain Location: Left Leg Pain Intervention(s): Monitored during session;Repositioned    Home Living                      Prior Function  PT Goals (current goals can now be found in the care plan section) Acute Rehab PT Goals Patient Stated Goal: to eventually walk again. wants to work on bed mobility in the hospital.  Potential to Achieve Goals: Good    Frequency    Min 2X/week      PT Plan Current plan remains appropriate    Co-evaluation              AM-PAC PT "6 Clicks" Mobility   Outcome Measure  Help needed turning from your back to your side while in a flat bed without using bedrails?: A Lot Help needed moving from lying on your back to sitting on the side of a flat bed without using bedrails?: Total Help needed moving to and from a bed to a chair (including a wheelchair)?: Total Help needed standing up from a chair using your arms (e.g., wheelchair or bedside chair)?: Total Help needed to walk in hospital room?: Total Help needed climbing 3-5 steps with a railing? : Total 6 Click Score: 7    End of Session Equipment Utilized During Treatment: Oxygen Activity Tolerance: Patient limited by pain Patient left: in bed;with bed alarm  set Nurse Communication: Other (comment) PT Visit Diagnosis: Difficulty in walking, not elsewhere classified (R26.2)     Time: 0601-5615 PT Time Calculation (min) (ACUTE ONLY): 23 min  Charges:  $Therapeutic Exercise: 8-22 mins $Therapeutic Activity: 8-22 mins                     Governor Rooks, PTA Acute Rehabilitation Services Pager [phone] Office [phone]     Villa Burgin Eli Hose 06/01/2019, 2:14 PM

## 2019-06-02 LAB — GLUCOSE, CAPILLARY
Glucose-Capillary: 139 mg/dL — ABNORMAL HIGH (ref 70–99)
Glucose-Capillary: 149 mg/dL — ABNORMAL HIGH (ref 70–99)
Glucose-Capillary: 150 mg/dL — ABNORMAL HIGH (ref 70–99)
Glucose-Capillary: 150 mg/dL — ABNORMAL HIGH (ref 70–99)

## 2019-06-02 NOTE — Progress Notes (Addendum)
Orthopaedic Trauma Progress Note  S: No major issues overnight. Pain controlled.  O:  Vitals:   06/01/19 2241 06/02/19 0405  BP: (!) 137/56 124/70  Pulse:  99  Resp:  17  Temp:  98.6 F (37 C)  SpO2:  94%    Gen: NAD, AAOx3 Cardiac: Heart RRR  Respiratory: No increased work of breathing. Wheezes bilaterally throughout lung fields R worse then left LLE: Flexion contracture present but improved. 25 degrees short of full extension. Dressings in place Swelling in foot and lower leg present. Neurovascularly intact  Imaging: Stable post op imaging  Labs:  Results for orders placed or performed during the hospital encounter of 05/29/19 (from the past 24 hour(s))  Glucose, capillary     Status: Abnormal   Collection Time: 06/01/19  7:59 AM  Result Value Ref Range   Glucose-Capillary 140 (H) 70 - 99 mg/dL  Glucose, capillary     Status: Abnormal   Collection Time: 06/01/19 12:19 PM  Result Value Ref Range   Glucose-Capillary 160 (H) 70 - 99 mg/dL  Glucose, capillary     Status: Abnormal   Collection Time: 06/01/19  5:02 PM  Result Value Ref Range   Glucose-Capillary 172 (H) 70 - 99 mg/dL  Glucose, capillary     Status: Abnormal   Collection Time: 06/01/19  8:03 PM  Result Value Ref Range   Glucose-Capillary 169 (H) 70 - 99 mg/dL  Glucose, capillary     Status: Abnormal   Collection Time: 06/02/19  7:42 AM  Result Value Ref Range   Glucose-Capillary 139 (H) 70 - 99 mg/dL    Assessment: Left femur nonunion s/p repair   Weightbearing: TDWB LLE  Insicional and dressing care: Dressing changed today. Change PRN  Orthopedic device(s):Discontinue CPM, continue to work on getting knee straight today  CV/Blood loss: Hemodynamically stable  Pain management: 1. Tylenol 1000mg  q 6 hours 2. Gabapentin 300 mg TID 3. Dilaudid 1 mg q 3 hours PRN 4. Robaxin 500mg  q 6 hours PRN 5. Oxycodone 5-15 mg q 4hours  VTE prophylaxis: Lovenox 40 mg daily  ID: Cultures negative, Vanc and  ceftriaxone discontinued  FEN: Plan for BMP tomorrow AM  Medical co-morbidities: Multiple medical problems-stable on home meds  Impediments to Fracture Healing: Severe vitamin D deficiency-5000 units vitamin D3 daily  Dispo: Home with [place] [facility] once medically stable likely tomorrow  Follow - up plan: TBD   Sarah A. Carmie Kanner [facility] ?([phone]? (phone)

## 2019-06-02 NOTE — Discharge Summary (Signed)
[facility] (OTS) Discharge Summary   Patient ID: Sandra Ferrell MRN: [id number] DOB/AGE: 1961-08-14 58 y.o.  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 Allergic rhinitis, cause unspecified   . Anemia   . Anxiety state, unspecified   . Backache, unspecified   . Carpal tunnel syndrome, right    Bilateral  . CHF (congestive heart failure) ([place])    unspecified , patient denies  . Chronic pain syndrome   . Constipation   . COPD (chronic obstructive pulmonary disease) ([place])   . Cough   . Depressive disorder, not elsewhere classified    managed with medications  . Difficulty in walking   . Displaced intertrochanteric fracture of left femur ([place])   . Esophageal reflux   . Extrinsic asthma with exacerbation   . Fibromyalgia   . Heart murmur    "a small one"  . History of kidney stones   . Hyperlipidemia   . Hypertension   . Muscle weakness (generalized)   . Nicotine dependence, cigarettes, uncomplicated   . Osteoarthrosis, unspecified whether generalized or localized, unspecified site   . Other abnormalities of gait and mobility   . Other and unspecified hyperlipidemia   . Other irritable bowel syndrome   . Other screening mammogram   . Personal history of unspecified urinary disorder   . Pneumonia   . Pressure ulcer of sacral region   . Routine general medical examination at a health care facility   . Routine gynecological examination   . Tobacco use disorder   . Type II or unspecified type diabetes mellitus without mention of complication, not stated as uncontrolled    Type II  . Unspecified fall, subsequent encounter   . Unspecified sleep apnea   . Urinary tract infection   . Vaginitis   . Wears glasses   . Wheezing      Procedures Performed: 1. CPT  97588-TGPQDI of left femur nonunion 2. CPT 20680-Removal of hardware left femur 3. CPT 27430-Quadriplasty of left thigh 4. CPT 20245-Bone biopsy of left femur  Discharged Condition: good  Hospital Course: Patient presented to [facility] hospital on 05/29/19 for repair of left distal femur nonunion. Was taken to operating room on that day for the above procedure, tolerated procedure well without complications. Was made touchdown weightbearing on left leg following surgery. Was started on Lovenox for DVT prophylaxis starting on POD #1. Began working with therapy starting on POD #1. Therapists have recommended skilled nursing facility for acute rehab at discharge, patient has declined this. She was found to have low potassium on admission labs, this was corrected while hospitalized. On 06/03/2019, the patient was tolerating diet, working well with therapies, pain well controlled, vital signs stable, dressings clean, dry, intact and felt stable for discharge to home with home health PT. Patient will follow up as below and knows to call with questions or concerns.     Consults: None   Results for orders placed or performed during the hospital encounter of 05/29/19 (from the past 168 hour(s))  SARS Coronavirus 2 (CEPHEID - Performed in [facility] hospital lab), [facility] Order   Collection Time: 05/29/19  6:03 AM   Specimen: Nasopharyngeal Swab  Result Value Ref Range   SARS Coronavirus 2 NEGATIVE NEGATIVE  Glucose, capillary   Collection Time: 05/29/19  6:19 AM  Result Value Ref Range   Glucose-Capillary 192 (H) 70 - 99 mg/dL  CBC   Collection Time: 05/29/19  6:42 AM  Result Value Ref Range   WBC 5.8 4.0 - 10.5 K/uL   RBC 3.21 (L) 3.87 - 5.11 MIL/uL   Hemoglobin 8.7 (L) 12.0 - 15.0 g/dL   HCT 28.8 (L) 36.0 - 46.0 %   MCV 89.7 80.0 - 100.0 fL   MCH 27.1 26.0 - 34.0 pg   MCHC 30.2 30.0 - 36.0 g/dL   RDW 15.4 11.5 - 15.5 %   Platelets 377 150 - 400 K/uL   nRBC 0.0 0.0 - 0.2 %  Basic metabolic  panel   Collection Time: 05/29/19  6:42 AM  Result Value Ref Range   Sodium 135 135 - 145 mmol/L   Potassium 3.2 (L) 3.5 - 5.1 mmol/L   Chloride 100 98 - 111 mmol/L   CO2 25 22 - 32 mmol/L   Glucose, Bld 207 (H) 70 - 99 mg/dL   BUN 18 6 - 20 mg/dL   Creatinine, Ser 0.71 0.44 - 1.00 mg/dL   Calcium 8.0 (L) 8.9 - 10.3 mg/dL   GFR calc non Af Amer >60 >60 mL/min   GFR calc Af Amer >60 >60 mL/min   Anion gap 10 5 - 15  Hemoglobin A1c   Collection Time: 05/29/19  6:42 AM  Result Value Ref Range   Hgb A1c MFr Bld 7.7 (H) 4.8 - 5.6 %   Mean Plasma Glucose 174.29 mg/dL  Type and screen [place]   Collection Time: 05/29/19  7:21 AM  Result Value Ref Range   ABO/RH(D) A POS    Antibody Screen NEG    Sample Expiration 06/01/2019,2359    Unit Number [id number]    Blood Component Type RED CELLS,LR    Unit division 00    Status of Unit ISSUED,FINAL    Transfusion Status OK TO TRANSFUSE    Crossmatch Result      Compatible Performed at [facility], [address] [place], [place] [address]    Unit Number [id number]    Blood Component Type RED CELLS,LR    Unit division 00    Status of Unit ISSUED,FINAL    Transfusion Status OK TO TRANSFUSE    Crossmatch Result Compatible    Unit Number [id number]    Blood Component Type RED CELLS,LR    Unit division 00    Status of Unit ISSUED,FINAL    Transfusion Status OK TO TRANSFUSE    Crossmatch Result Compatible   Prepare RBC   Collection Time: 05/29/19  7:21 AM  Result Value Ref Range   Order Confirmation      ORDER PROCESSED BY BLOOD BANK Performed at [facility], [place] [address] [place], [place] [address]   [facility]   Collection Time: 05/29/19  7:21 AM  Result Value Ref Range   ISSUE DATE / TIME 740814481856    Blood Product Unit Number [id number]    PRODUCT CODE [id number]    Unit Type and Rh 2878    Blood Product Expiration Date 676720947096    ISSUE DATE / TIME 283662947654     Blood Product Unit Number [id number]    PRODUCT CODE [id number]    Unit Type and Rh 9449    Blood Product Expiration Date 675916384665    ISSUE DATE / TIME 993570177939    Blood Product Unit Number [id number]    PRODUCT CODE [id number]  Unit Type and Rh 6200    Blood Product Expiration Date 627035009381   Fungus Culture With Stain   Collection Time: 05/29/19 10:09 AM   Specimen: Soft Tissue, Other  Result Value Ref Range   Fungus Stain Final report    Fungus (Mycology) Culture PENDING    Fungal Source TISSUE   Aerobic/Anaerobic Culture (surgical/deep wound)   Collection Time: 05/29/19 10:09 AM   Specimen: Soft Tissue, Other  Result Value Ref Range   Specimen Description TISSUE LEFT FEMUR NON UNION SITE    Special Requests NONE    Gram Stain      FEW WBC PRESENT, PREDOMINANTLY MONONUCLEAR NO ORGANISMS SEEN    Culture      NO GROWTH 4 DAYS NO ANAEROBES ISOLATED; CULTURE IN PROGRESS FOR 5 DAYS Performed at [facility], [place] [address], [place], [place] [address]    Report Status PENDING   Acid Fast Smear (AFB)   Collection Time: 05/29/19 10:09 AM   Specimen: Soft Tissue, Other  Result Value Ref Range   AFB Specimen Processing Concentration    Acid Fast Smear Negative    Source (AFB) TISSUE   Fungus Culture Result   Collection Time: 05/29/19 10:09 AM  Result Value Ref Range   Result 1 Comment   I-STAT 4, (NA,K, GLUC, HGB,HCT)   Collection Time: 05/29/19 10:14 AM  Result Value Ref Range   Sodium 138 135 - 145 mmol/L   Potassium 3.3 (L) 3.5 - 5.1 mmol/L   Glucose, Bld 143 (H) 70 - 99 mg/dL   HCT 26.0 (L) 36.0 - 46.0 %   Hemoglobin 8.8 (L) 12.0 - 15.0 g/dL  Glucose, capillary   Collection Time: 05/29/19 11:38 AM  Result Value Ref Range   Glucose-Capillary 126 (H) 70 - 99 mg/dL  Glucose, capillary   Collection Time: 05/29/19 12:50 PM  Result Value Ref Range   Glucose-Capillary 141 (H) 70 - 99 mg/dL  Glucose, capillary   Collection Time: 05/29/19  4:32 PM   Result Value Ref Range   Glucose-Capillary 240 (H) 70 - 99 mg/dL  Glucose, capillary   Collection Time: 05/29/19  8:30 PM  Result Value Ref Range   Glucose-Capillary 152 (H) 70 - 99 mg/dL  CBC   Collection Time: 05/30/19  4:45 AM  Result Value Ref Range   WBC 6.2 4.0 - 10.5 K/uL   RBC 2.38 (L) 3.87 - 5.11 MIL/uL   Hemoglobin 6.6 (LL) 12.0 - 15.0 g/dL   HCT 20.8 (L) 36.0 - 46.0 %   MCV 87.4 80.0 - 100.0 fL   MCH 27.7 26.0 - 34.0 pg   MCHC 31.7 30.0 - 36.0 g/dL   RDW 15.2 11.5 - 15.5 %   Platelets 305 150 - 400 K/uL   nRBC 0.0 0.0 - 0.2 %  Basic metabolic panel   Collection Time: 05/30/19  4:45 AM  Result Value Ref Range   Sodium 133 (L) 135 - 145 mmol/L   Potassium 3.8 3.5 - 5.1 mmol/L   Chloride 100 98 - 111 mmol/L   CO2 24 22 - 32 mmol/L   Glucose, Bld 156 (H) 70 - 99 mg/dL   BUN 22 (H) 6 - 20 mg/dL   Creatinine, Ser 0.82 0.44 - 1.00 mg/dL   Calcium 7.7 (L) 8.9 - 10.3 mg/dL   GFR calc non Af Amer >60 >60 mL/min   GFR calc Af Amer >60 >60 mL/min   Anion gap 9 5 - 15  Glucose, capillary  Collection Time: 05/30/19  7:45 AM  Result Value Ref Range   Glucose-Capillary 140 (H) 70 - 99 mg/dL  Glucose, capillary   Collection Time: 05/30/19 12:20 PM  Result Value Ref Range   Glucose-Capillary 135 (H) 70 - 99 mg/dL  Prepare RBC   Collection Time: 05/30/19  3:12 PM  Result Value Ref Range   Order Confirmation      ORDER PROCESSED BY BLOOD BANK Performed at [facility], [place] [address] [place], [place] [address]   Glucose, capillary   Collection Time: 05/30/19  3:56 PM  Result Value Ref Range   Glucose-Capillary 128 (H) 70 - 99 mg/dL  Glucose, capillary   Collection Time: 05/30/19  9:35 PM  Result Value Ref Range   Glucose-Capillary 151 (H) 70 - 99 mg/dL  Basic metabolic panel   Collection Time: 05/31/19  3:46 AM  Result Value Ref Range   Sodium 135 135 - 145 mmol/L   Potassium 3.0 (L) 3.5 - 5.1 mmol/L   Chloride 101 98 - 111 mmol/L   CO2 26 22 - 32 mmol/L    Glucose, Bld 156 (H) 70 - 99 mg/dL   BUN 18 6 - 20 mg/dL   Creatinine, Ser 0.70 0.44 - 1.00 mg/dL   Calcium 7.7 (L) 8.9 - 10.3 mg/dL   GFR calc non Af Amer >60 >60 mL/min   GFR calc Af Amer >60 >60 mL/min   Anion gap 8 5 - 15  CBC   Collection Time: 05/31/19  3:46 AM  Result Value Ref Range   WBC 6.7 4.0 - 10.5 K/uL   RBC 2.49 (L) 3.87 - 5.11 MIL/uL   Hemoglobin 6.9 (LL) 12.0 - 15.0 g/dL   HCT 22.1 (L) 36.0 - 46.0 %   MCV 88.8 80.0 - 100.0 fL   MCH 27.7 26.0 - 34.0 pg   MCHC 31.2 30.0 - 36.0 g/dL   RDW 14.6 11.5 - 15.5 %   Platelets 278 150 - 400 K/uL   nRBC 0.6 (H) 0.0 - 0.2 %  Prepare RBC   Collection Time: 05/31/19  7:07 AM  Result Value Ref Range   Order Confirmation      ORDER PROCESSED BY BLOOD BANK already have 2 units available. Performed at [facility], [place] [address], [place], [place] [address]   Glucose, capillary   Collection Time: 05/31/19  7:47 AM  Result Value Ref Range   Glucose-Capillary 130 (H) 70 - 99 mg/dL  Glucose, capillary   Collection Time: 05/31/19 11:46 AM  Result Value Ref Range   Glucose-Capillary 133 (H) 70 - 99 mg/dL  Glucose, capillary   Collection Time: 05/31/19  4:18 PM  Result Value Ref Range   Glucose-Capillary 137 (H) 70 - 99 mg/dL  Glucose, capillary   Collection Time: 05/31/19  9:39 PM  Result Value Ref Range   Glucose-Capillary 160 (H) 70 - 99 mg/dL  Hemoglobin and hematocrit, blood   Collection Time: 05/31/19 10:24 PM  Result Value Ref Range   Hemoglobin 9.5 (L) 12.0 - 15.0 g/dL   HCT 29.3 (L) 36.0 - 09.3 %  Basic metabolic panel   Collection Time: 06/01/19  4:04 AM  Result Value Ref Range   Sodium 134 (L) 135 - 145 mmol/L   Potassium 3.2 (L) 3.5 - 5.1 mmol/L   Chloride 100 98 - 111 mmol/L   CO2 25 22 - 32 mmol/L   Glucose, Bld 177 (H) 70 - 99 mg/dL   BUN 16 6 -  20 mg/dL   Creatinine, Ser 0.62 0.44 - 1.00 mg/dL   Calcium 7.7 (L) 8.9 - 10.3 mg/dL   GFR calc non Af Amer >60 >60 mL/min   GFR calc Af Amer >60 >60  mL/min   Anion gap 9 5 - 15  CBC   Collection Time: 06/01/19  4:04 AM  Result Value Ref Range   WBC 7.2 4.0 - 10.5 K/uL   RBC 3.12 (L) 3.87 - 5.11 MIL/uL   Hemoglobin 8.9 (L) 12.0 - 15.0 g/dL   HCT 27.8 (L) 36.0 - 46.0 %   MCV 89.1 80.0 - 100.0 fL   MCH 28.5 26.0 - 34.0 pg   MCHC 32.0 30.0 - 36.0 g/dL   RDW 14.9 11.5 - 15.5 %   Platelets 280 150 - 400 K/uL   nRBC 0.0 0.0 - 0.2 %  Glucose, capillary   Collection Time: 06/01/19  7:59 AM  Result Value Ref Range   Glucose-Capillary 140 (H) 70 - 99 mg/dL  Glucose, capillary   Collection Time: 06/01/19 12:19 PM  Result Value Ref Range   Glucose-Capillary 160 (H) 70 - 99 mg/dL  Glucose, capillary   Collection Time: 06/01/19  5:02 PM  Result Value Ref Range   Glucose-Capillary 172 (H) 70 - 99 mg/dL  Glucose, capillary   Collection Time: 06/01/19  8:03 PM  Result Value Ref Range   Glucose-Capillary 169 (H) 70 - 99 mg/dL  Glucose, capillary   Collection Time: 06/02/19  7:42 AM  Result Value Ref Range   Glucose-Capillary 139 (H) 70 - 99 mg/dL  Glucose, capillary   Collection Time: 06/02/19 11:04 AM  Result Value Ref Range   Glucose-Capillary 149 (H) 70 - 99 mg/dL  Glucose, capillary   Collection Time: 06/02/19  4:25 PM  Result Value Ref Range   Glucose-Capillary 150 (H) 70 - 99 mg/dL  Glucose, capillary   Collection Time: 06/02/19  8:52 PM  Result Value Ref Range   Glucose-Capillary 150 (H) 70 - 99 mg/dL  CBC   Collection Time: 06/03/19  4:19 AM  Result Value Ref Range   WBC 6.0 4.0 - 10.5 K/uL   RBC 3.21 (L) 3.87 - 5.11 MIL/uL   Hemoglobin 9.2 (L) 12.0 - 15.0 g/dL   HCT 28.7 (L) 36.0 - 46.0 %   MCV 89.4 80.0 - 100.0 fL   MCH 28.7 26.0 - 34.0 pg   MCHC 32.1 30.0 - 36.0 g/dL   RDW 15.2 11.5 - 15.5 %   Platelets 331 150 - 400 K/uL   nRBC 0.3 (H) 0.0 - 0.2 %  Basic metabolic panel   Collection Time: 06/03/19  4:19 AM  Result Value Ref Range   Sodium 134 (L) 135 - 145 mmol/L   Potassium 3.9 3.5 - 5.1 mmol/L    Chloride 99 98 - 111 mmol/L   CO2 25 22 - 32 mmol/L   Glucose, Bld 193 (H) 70 - 99 mg/dL   BUN 13 6 - 20 mg/dL   Creatinine, Ser 0.67 0.44 - 1.00 mg/dL   Calcium 8.2 (L) 8.9 - 10.3 mg/dL   GFR calc non Af Amer >60 >60 mL/min   GFR calc Af Amer >60 >60 mL/min   Anion gap 10 5 - 15     Treatments: surgery: 1. CPT 27470-Repair of left femur nonunion 2. CPT 20680-Removal of hardware left femur 3. CPT 27430-Quadriplasty of left thigh 4. CPT 20245-Bone biopsy of left femur  Discharge Exam: Gen: NAD, AAOx3 Cardiac: Heart RRR  Respiratory: No increased work of breathing. Chronic wheezes bilaterally throughout lung fields right worse then left LLE: Flexion contracture present but improved. 25 degrees short of full extension. Dressings removed, incisions clean, dry, intact. Swelling in foot and lower leg present. Neurovascularly intact  Disposition: Discharge disposition: 06-Home-Health Care Svc        Allergies as of 06/03/2019      Reactions   Benazepril Anaphylaxis, Swelling, Other (See Comments)   Angioedema, throat swelling   Diclofenac Sodium Other (See Comments)   GI bleed   Fluticasone-salmeterol Hives, Itching, Swelling   Tongue was swollen   Nsaids Other (See Comments)   Rectal bleeding   Penicillins Hives, Rash   Did it involve swelling of the face/tongue/throat, SOB, or low BP? No Did it involve sudden or severe rash/hives, skin peeling, or any reaction on the inside of your mouth or nose? No Did you need to seek medical attention at a hospital or doctor's office? No When did it last happen?5 - 6 years If all above answers are "NO", may proceed with cephalosporin use.   Morphine And Related    rash   Aspirin Other (See Comments)   Burns stomach   Propoxyphene Other (See Comments)   Darvocet - Headache      Medication List    STOP taking these medications   acetaminophen-codeine 300-30 MG tablet Commonly known as: TYLENOL #3     TAKE these  medications   Accu-Chek Aviva Plus w/Device Kit Check blood sugar twice daily and as directed.   Accu-Chek Softclix Lancets lancets CHECK BLOOD SUGAR TWICE DAILY AND AS DIRECTED   acetaminophen 650 MG CR tablet Commonly known as: TYLENOL Take 1,300 mg by mouth every 8 (eight) hours as needed for pain.   Adjustable Lancing Device Misc Check blood sugar twice a day and as directed. Dx E11.9   AIMSCO INSULIN SYR ULTRA THIN 31G X 5/16" 0.3 ML Misc Generic drug: Insulin Syringe-Needle U-100   Alcohol Swabs Pads Check blood sugar twice a day and as directed. Dx E11.9   ALPRAZolam 0.5 MG tablet Commonly known as: XANAX TAKE 1 TABLET(0.5 MG) BY MOUTH THREE TIMES DAILY What changed: See the new instructions.   amitriptyline 75 MG tablet Commonly known as: ELAVIL TAKE 1 TABLET(75 MG) BY MOUTH AT BEDTIME What changed: See the new instructions.   B-D ULTRAFINE III SHORT PEN 31G X 8 MM Misc Generic drug: Insulin Pen Needle USE AS DIRECTED WITH INSULIN PEN   budesonide-formoterol 160-4.5 MCG/ACT inhaler Commonly known as: SYMBICORT INHALE 2 PUFFS INTO THE LUNGS TWICE DAILY What changed: when to take this   chlorpheniramine 4 MG tablet Commonly known as: CHLOR-TRIMETON Take 8 mg by mouth 2 (two) times daily as needed for allergies.   enoxaparin 40 MG/0.4ML injection Commonly known as: LOVENOX Inject 0.4 mLs (40 mg total) into the skin daily.   feeding supplement (GLUCERNA SHAKE) Liqd Take 237 mLs by mouth 2 (two) times daily between meals. What changed: when to take this   furosemide 20 MG tablet Commonly known as: LASIX TAKE 1 TABLET(20 MG) BY MOUTH DAILY What changed: See the new instructions.   gabapentin 300 MG capsule Commonly known as: NEURONTIN TAKE ONE CAPSULE BY MOUTH IN THE MORNING, 1 CAPSULE IN THE AFTERNOON, AND 2 CAPSULES AT BEDTIME What changed: See the new instructions.   gemfibrozil 600 MG tablet Commonly known as: LOPID TAKE 1 TABLET BY MOUTH TWICE  DAILY What changed: when to take this   glucose blood  test strip Commonly known as: Accu-Chek Aviva Plus TEST BLOOD SUGAR TWICE DAILY AND DIRECTED   Insulin Glargine 100 UNIT/ML Solostar Pen Commonly known as: Lantus SoloStar ADMINISTER 35 UNITS UNDER THE SKIN AT BEDTIME What changed:   how much to take  how to take this  when to take this  reasons to take this  additional instructions   metFORMIN 1000 MG tablet Commonly known as: GLUCOPHAGE Take 1,000 mg by mouth 2 (two) times daily with a meal.   methocarbamol 500 MG tablet Commonly known as: ROBAXIN Take 2 tablets (1,000 mg total) by mouth at bedtime as needed for muscle spasms. What changed: when to take this   omeprazole 20 MG tablet Commonly known as: PRILOSEC OTC Take 20 mg by mouth daily.   OVER THE COUNTER MEDICATION Apply 1 application topically daily as needed (pain). Hemp oil   Oxycodone HCl 10 MG Tabs Take 1 tablet (10 mg total) by mouth every 4 (four) hours as needed for severe pain.   pioglitazone 45 MG tablet Commonly known as: ACTOS TAKE 1 TABLET BY MOUTH EVERY DAY   ProAir HFA 108 (90 Base) MCG/ACT inhaler Generic drug: albuterol TAKE 2 PUFFS BY MOUTH EVERY 4 HOURS AS NEEDED FOR WHEEZE OR FOR SHORTNESS OF BREATH What changed: See the new instructions.   Salonpas Pain Relief Patch Ptch Apply 1 patch topically daily as needed (pain).   Vitamin D (Ergocalciferol) 1.25 MG (50000 UT) Caps capsule Commonly known as: DRISDOL Take 1 capsule (50,000 Units total) by mouth every 7 (seven) days. What changed: when to take this            Durable Medical Equipment  (From admission, onward)         Start     Ordered   06/01/19 1920  For home use only DME Other see comment  Once    Comments: Patient needs hoyer lift. She already has an account with a home health agency  Question:  Length of Need  Answer:  6 Months   06/01/19 1920         Follow-up Information    Haddix, Thomasene Lot, MD.  Schedule an appointment as soon as possible for a visit on 06/09/2019.   Specialty: Orthopedic Surgery Why: for suture removal and repeat x-rays Contact information: [place] [address] [phone]        Jinny Sanders, MD. Schedule an appointment as soon as possible for a visit in 1 week(s).   Specialty: Family Medicine Why: Call primary care to schedule an appointment in the next 1-2 weeks Contact information: [place] [address] [phone]           Discharge Instructions and Plan: Patient will be discharged to home with home health PT. She will remain touchdown weightbearing on left leg. Will be discharged on Lovenox for DVT prophylaxis. Patient has been provided with all the necessary DME for discharge. Patient will follow up with Dr. Doreatha Martin in 2 weeks for repeat x-rays and suture removal. Has been instructed to follow up with PCP in the next 1-2 weeks   Signed:  Toyoko Silos A. Carmie Kanner ?[phone]? (phone) 06/03/2019, 7:13 AM  [facility] [place]  [address] [phone] (F)

## 2019-06-02 NOTE — Plan of Care (Signed)
  Problem: Education: Goal: Knowledge of General Education information will improve Description: Including pain rating scale, medication(s)/side effects and non-pharmacologic comfort measures Outcome: Progressing   Problem: Clinical Measurements: Goal: Will remain free from infection Outcome: Progressing Goal: Diagnostic test results will improve Outcome: Progressing Goal: Respiratory complications will improve Outcome: Progressing   Problem: Activity: Goal: Risk for activity intolerance will decrease Outcome: Progressing   Problem: Nutrition: Goal: Adequate nutrition will be maintained Outcome: Progressing   Problem: Pain Managment: Goal: General experience of comfort will improve Outcome: Progressing   Problem: Safety: Goal: Ability to remain free from injury will improve Outcome: Progressing   Problem: Skin Integrity: Goal: Risk for impaired skin integrity will decrease Outcome: Progressing

## 2019-06-02 NOTE — Discharge Instructions (Signed)
Orthopaedic Trauma Service Discharge Instructions   General Discharge Instructions  WEIGHT BEARING STATUS: touchdown weightbearing left leg   RANGE OF MOTION/ACTIVITY: Unrestricted range of motion of knee. Work on getting knee as straight as you can  Wound Care: Incisions can be left open to air if there is no drainage. If incision continues to have drainage, follow wound care instructions below. Okay to shower if no drainage from incisions.  DVT/PE prophylaxis: Lovenox  Diet: as you were eating previously.  Can use over the counter stool softeners and bowel preparations, such as Miralax, to help with bowel movements.  Narcotics can be constipating.  Be sure to drink plenty of fluids  PAIN MEDICATION USE AND EXPECTATIONS  You have likely been given narcotic medications to help control your pain.  After a traumatic event that results in an fracture (broken bone) with or without surgery, it is ok to use narcotic pain medications to help control one's pain.  We understand that everyone responds to pain differently and each individual patient will be evaluated on a regular basis for the continued need for narcotic medications. Ideally, narcotic medication use should last no more than 6-8 weeks (coinciding with fracture healing).   As a patient it is your responsibility as well to monitor narcotic medication use and report the amount and frequency you use these medications when you come to your office visit.   We would also advise that if you are using narcotic medications, you should take a dose prior to therapy to maximize you participation.  IF YOU ARE ON NARCOTIC MEDICATIONS IT IS NOT PERMISSIBLE TO OPERATE A MOTOR VEHICLE (MOTORCYCLE/CAR/TRUCK/MOPED) OR HEAVY MACHINERY DO NOT MIX NARCOTICS WITH OTHER CNS (CENTRAL NERVOUS SYSTEM) DEPRESSANTS SUCH AS ALCOHOL   STOP SMOKING OR USING NICOTINE PRODUCTS!!!!  As discussed nicotine severely impairs your body's ability to heal surgical and  traumatic wounds but also impairs bone healing.  Wounds and bone heal by forming microscopic blood vessels (angiogenesis) and nicotine is a vasoconstrictor (essentially, shrinks blood vessels).  Therefore, if vasoconstriction occurs to these microscopic blood vessels they essentially disappear and are unable to deliver necessary nutrients to the healing tissue.  This is one modifiable factor that you can do to dramatically increase your chances of healing your injury.    (This means no smoking, no nicotine gum, patches, etc)  DO NOT USE NONSTEROIDAL ANTI-INFLAMMATORY DRUGS (NSAID'S)  Using products such as Advil (ibuprofen), Aleve (naproxen), Motrin (ibuprofen) for additional pain control during fracture healing can delay and/or prevent the healing response.  If you would like to take over the counter (OTC) medication, Tylenol (acetaminophen) is ok.  However, some narcotic medications that are given for pain control contain acetaminophen as well. Therefore, you should not exceed more than 4000 mg of tylenol in a day if you do not have liver disease.  Also note that there are may OTC medicines, such as cold medicines and allergy medicines that my contain tylenol as well.  If you have any questions about medications and/or interactions please ask your doctor/PA or your pharmacist.      ICE AND ELEVATE INJURED/OPERATIVE EXTREMITY  Using ice and elevating the injured extremity above your heart can help with swelling and pain control.  Icing in a pulsatile fashion, such as 20 minutes on and 20 minutes off, can be followed.    Do not place ice directly on skin. Make sure there is a barrier between to skin and the ice pack.    Using frozen  items such as frozen peas works well as the conform nicely to the are that needs to be iced.  USE AN ACE WRAP OR TED HOSE FOR SWELLING CONTROL  In addition to icing and elevation, Ace wraps or TED hose are used to help limit and resolve swelling.  It is recommended to use  Ace wraps or TED hose until you are informed to stop.    When using Ace Wraps start the wrapping distally (farthest away from the body) and wrap proximally (closer to the body)   Example: If you had surgery on your leg or thing and you do not have a splint on, start the ace wrap at the toes and work your way up to the thigh        If you had surgery on your upper extremity and do not have a splint on, start the ace wrap at your fingers and work your way up to the upper arm   [place]: [phone]   VISIT OUR WEBSITE FOR ADDITIONAL INFORMATION: [email or web address]    Discharge Wound Care Instructions  Do NOT apply any ointments, solutions or lotions to pin sites or surgical wounds.  These prevent needed drainage and even though solutions like hydrogen peroxide kill bacteria, they also damage cells lining the pin sites that help fight infection.  Applying lotions or ointments can keep the wounds moist and can cause them to breakdown and open up as well. This can increase the risk for infection. When in doubt call the office.  Surgical incisions should be dressed daily.  If any drainage is noted, use one layer of adaptic, then gauze, Kerlix, and an ace wrap.  Once the incision is completely dry and without drainage, it may be left open to air out.  Showering may begin 36-48 hours later.  Cleaning gently with soap and water.  Traumatic wounds should be dressed daily as well.    One layer of adaptic, gauze, Kerlix, then ace wrap.  The adaptic can be discontinued once the draining has ceased    If you have a wet to dry dressing: wet the gauze with saline the squeeze as much saline out so the gauze is moist (not soaking wet), place moistened gauze over wound, then place a dry gauze over the moist one, followed by Kerlix wrap, then ace wrap.

## 2019-06-03 ENCOUNTER — Ambulatory Visit: Payer: Medicare Other

## 2019-06-03 DIAGNOSIS — Z23 Encounter for immunization: Secondary | ICD-10-CM | POA: Diagnosis not present

## 2019-06-03 LAB — BASIC METABOLIC PANEL
Anion gap: 10 (ref 5–15)
BUN: 13 mg/dL (ref 6–20)
CO2: 25 mmol/L (ref 22–32)
Calcium: 8.2 mg/dL — ABNORMAL LOW (ref 8.9–10.3)
Chloride: 99 mmol/L (ref 98–111)
Creatinine, Ser: 0.67 mg/dL (ref 0.44–1.00)
GFR calc Af Amer: >60 mL/min (ref >60)
GFR calc non Af Amer: >60 mL/min (ref >60)
Glucose, Bld: 193 mg/dL — ABNORMAL HIGH (ref 70–99)
Potassium: 3.9 mmol/L (ref 3.5–5.1)
Sodium: 134 mmol/L — ABNORMAL LOW (ref 135–145)

## 2019-06-03 LAB — CBC
HCT: 28.7 % — ABNORMAL LOW (ref 36.0–46.0)
Hemoglobin: 9.2 g/dL — ABNORMAL LOW (ref 12.0–15.0)
MCH: 28.7 pg (ref 26.0–34.0)
MCHC: 32.1 g/dL (ref 30.0–36.0)
MCV: 89.4 fL (ref 80.0–100.0)
Platelets: 331 10*3/uL (ref 150–400)
RBC: 3.21 MIL/uL — ABNORMAL LOW (ref 3.87–5.11)
RDW: 15.2 % (ref 11.5–15.5)
WBC: 6 10*3/uL (ref 4.0–10.5)
nRBC: 0.3 % — ABNORMAL HIGH (ref 0.0–0.2)

## 2019-06-03 LAB — AEROBIC/ANAEROBIC CULTURE W GRAM STAIN (SURGICAL/DEEP WOUND): Culture: NO GROWTH

## 2019-06-03 LAB — GLUCOSE, CAPILLARY: Glucose-Capillary: 157 mg/dL — ABNORMAL HIGH (ref 70–99)

## 2019-06-03 MED ORDER — OXYCODONE HCL 10 MG PO TABS: 10.0000 mg | ORAL_TABLET | ORAL | 0 refills | Status: DC | PRN

## 2019-06-03 MED ORDER — ENOXAPARIN SODIUM 40 MG/0.4ML ~~LOC~~ SOLN: 40.0000 mg | SUBCUTANEOUS | 0 refills | Status: DC
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
## 2019-06-03 NOTE — TOC Transition Note (Signed)
Transition of Care [facility]) - CM/SW Discharge Note   Patient Details  Name: Sandra Ferrell MRN: [id number] Date of Birth: 11/21/1960  Transition of Care [facility]) CM/SW Contact:  Alberteen Sam, LCSW Phone Number: 06/03/2019, 10:42 AM   Clinical Narrative:     Patient will DC to: Home Anticipated DC date: 06/03/2019 Family notified:James Transport EF:[facility]  Per MD patient ready for DC to Home, set up with [place] for PT and [employer] will deliver [facility]ME [facility] lift to home.  Ambulance transport requested for patient.  CSW signing off.  [place], [place]   Final next level of care: Home w Home Health Services Barriers to Discharge: No Barriers Identified   Patient Goals and CMS Choice   CMS Medicare.gov Compare Post Acute Care list provided to:: Patient Represenative (must comment)(spouse) Choice offered to / list presented to : Spouse  Discharge Placement                Patient to be transferred to facility by: [place] Name of family member notified: Jeneen Rinks Patient and family notified of of transfer: 06/03/19  Discharge Plan and Services     Post Acute Care Choice: Home Health          DME Arranged: Other see comment(hoyer lift) DME Agency: [facility] Date DME Agency Contacted: 06/03/19 Time DME Agency Contacted: [id number] Representative spoke with at DME Agency: [place]: PT [place]: [facility] Date [place]: 06/03/19 Time [place]: 1038 Representative spoke with at [place]: [place] ([place]) Interventions     Readmission Risk Interventions Readmission Risk Prevention Plan 03/26/2019  Transportation Screening Complete  Home Care Screening Complete  Some recent data might be hidden
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
## 2019-06-03 NOTE — Progress Notes (Signed)
Pt given discharge instructions and gone over with her. Pt shown how to do lovenox injections, all questions answered. All belongings gathered to be sent home. Pt waiting on [facility] for transport.

## 2019-06-04 ENCOUNTER — Other Ambulatory Visit: Payer: Self-pay | Admitting: Family Medicine

## 2019-06-04 DIAGNOSIS — S72452K Displaced supracondylar fracture without intracondylar extension of lower end of left femur, subsequent encounter for closed fracture with nonunion: Secondary | ICD-10-CM | POA: Diagnosis not present

## 2019-06-04 DIAGNOSIS — S72492S Other fracture of lower end of left femur, sequela: Secondary | ICD-10-CM | POA: Diagnosis not present

## 2019-06-04 DIAGNOSIS — S72142D Displaced intertrochanteric fracture of left femur, subsequent encounter for closed fracture with routine healing: Secondary | ICD-10-CM | POA: Diagnosis not present

## 2019-06-04 NOTE — Telephone Encounter (Signed)
Last office visit 04/17/2019 for DM.  Last refilled 03/05/2019 for #60 with 2 refills.  No future appointments.

## 2019-06-05 DIAGNOSIS — S72402K Unspecified fracture of lower end of left femur, subsequent encounter for closed fracture with nonunion: Secondary | ICD-10-CM | POA: Diagnosis not present

## 2019-06-05 DIAGNOSIS — G4700 Insomnia, unspecified: Secondary | ICD-10-CM | POA: Diagnosis not present

## 2019-06-05 DIAGNOSIS — Z87442 Personal history of urinary calculi: Secondary | ICD-10-CM | POA: Diagnosis not present

## 2019-06-05 DIAGNOSIS — J449 Chronic obstructive pulmonary disease, unspecified: Secondary | ICD-10-CM | POA: Diagnosis not present

## 2019-06-05 DIAGNOSIS — I509 Heart failure, unspecified: Secondary | ICD-10-CM | POA: Diagnosis not present

## 2019-06-05 DIAGNOSIS — I110 Hypertensive heart disease with heart failure: Secondary | ICD-10-CM | POA: Diagnosis not present

## 2019-06-05 DIAGNOSIS — E119 Type 2 diabetes mellitus without complications: Secondary | ICD-10-CM | POA: Diagnosis not present

## 2019-06-05 DIAGNOSIS — E785 Hyperlipidemia, unspecified: Secondary | ICD-10-CM | POA: Diagnosis not present

## 2019-06-05 DIAGNOSIS — G894 Chronic pain syndrome: Secondary | ICD-10-CM | POA: Diagnosis not present

## 2019-06-05 DIAGNOSIS — M1990 Unspecified osteoarthritis, unspecified site: Secondary | ICD-10-CM | POA: Diagnosis not present

## 2019-06-05 DIAGNOSIS — Z981 Arthrodesis status: Secondary | ICD-10-CM | POA: Diagnosis not present

## 2019-06-05 DIAGNOSIS — Z87440 Personal history of urinary (tract) infections: Secondary | ICD-10-CM | POA: Diagnosis not present

## 2019-06-05 DIAGNOSIS — Z79891 Long term (current) use of opiate analgesic: Secondary | ICD-10-CM | POA: Diagnosis not present

## 2019-06-05 DIAGNOSIS — M797 Fibromyalgia: Secondary | ICD-10-CM | POA: Diagnosis not present

## 2019-06-05 DIAGNOSIS — Z9181 History of falling: Secondary | ICD-10-CM | POA: Diagnosis not present

## 2019-06-09 DIAGNOSIS — Z9181 History of falling: Secondary | ICD-10-CM | POA: Diagnosis not present

## 2019-06-09 DIAGNOSIS — Z87442 Personal history of urinary calculi: Secondary | ICD-10-CM | POA: Diagnosis not present

## 2019-06-09 DIAGNOSIS — Z87440 Personal history of urinary (tract) infections: Secondary | ICD-10-CM | POA: Diagnosis not present

## 2019-06-09 DIAGNOSIS — M1990 Unspecified osteoarthritis, unspecified site: Secondary | ICD-10-CM | POA: Diagnosis not present

## 2019-06-09 DIAGNOSIS — I110 Hypertensive heart disease with heart failure: Secondary | ICD-10-CM | POA: Diagnosis not present

## 2019-06-09 DIAGNOSIS — I509 Heart failure, unspecified: Secondary | ICD-10-CM | POA: Diagnosis not present

## 2019-06-09 DIAGNOSIS — G4700 Insomnia, unspecified: Secondary | ICD-10-CM | POA: Diagnosis not present

## 2019-06-09 DIAGNOSIS — M797 Fibromyalgia: Secondary | ICD-10-CM | POA: Diagnosis not present

## 2019-06-09 DIAGNOSIS — S72402K Unspecified fracture of lower end of left femur, subsequent encounter for closed fracture with nonunion: Secondary | ICD-10-CM | POA: Diagnosis not present

## 2019-06-09 DIAGNOSIS — G894 Chronic pain syndrome: Secondary | ICD-10-CM | POA: Diagnosis not present

## 2019-06-09 DIAGNOSIS — Z981 Arthrodesis status: Secondary | ICD-10-CM | POA: Diagnosis not present

## 2019-06-09 DIAGNOSIS — E119 Type 2 diabetes mellitus without complications: Secondary | ICD-10-CM | POA: Diagnosis not present

## 2019-06-09 DIAGNOSIS — J449 Chronic obstructive pulmonary disease, unspecified: Secondary | ICD-10-CM | POA: Diagnosis not present

## 2019-06-09 DIAGNOSIS — E785 Hyperlipidemia, unspecified: Secondary | ICD-10-CM | POA: Diagnosis not present

## 2019-06-09 DIAGNOSIS — Z79891 Long term (current) use of opiate analgesic: Secondary | ICD-10-CM | POA: Diagnosis not present

## 2019-06-10 DIAGNOSIS — Z87440 Personal history of urinary (tract) infections: Secondary | ICD-10-CM | POA: Diagnosis not present

## 2019-06-10 DIAGNOSIS — Z87442 Personal history of urinary calculi: Secondary | ICD-10-CM | POA: Diagnosis not present

## 2019-06-10 DIAGNOSIS — Z981 Arthrodesis status: Secondary | ICD-10-CM | POA: Diagnosis not present

## 2019-06-10 DIAGNOSIS — G4700 Insomnia, unspecified: Secondary | ICD-10-CM | POA: Diagnosis not present

## 2019-06-10 DIAGNOSIS — E785 Hyperlipidemia, unspecified: Secondary | ICD-10-CM | POA: Diagnosis not present

## 2019-06-10 DIAGNOSIS — M1990 Unspecified osteoarthritis, unspecified site: Secondary | ICD-10-CM | POA: Diagnosis not present

## 2019-06-10 DIAGNOSIS — E119 Type 2 diabetes mellitus without complications: Secondary | ICD-10-CM | POA: Diagnosis not present

## 2019-06-10 DIAGNOSIS — I110 Hypertensive heart disease with heart failure: Secondary | ICD-10-CM | POA: Diagnosis not present

## 2019-06-10 DIAGNOSIS — Z9181 History of falling: Secondary | ICD-10-CM | POA: Diagnosis not present

## 2019-06-10 DIAGNOSIS — I509 Heart failure, unspecified: Secondary | ICD-10-CM | POA: Diagnosis not present

## 2019-06-10 DIAGNOSIS — Z79891 Long term (current) use of opiate analgesic: Secondary | ICD-10-CM | POA: Diagnosis not present

## 2019-06-10 DIAGNOSIS — S72402K Unspecified fracture of lower end of left femur, subsequent encounter for closed fracture with nonunion: Secondary | ICD-10-CM | POA: Diagnosis not present

## 2019-06-10 DIAGNOSIS — M797 Fibromyalgia: Secondary | ICD-10-CM | POA: Diagnosis not present

## 2019-06-10 DIAGNOSIS — J449 Chronic obstructive pulmonary disease, unspecified: Secondary | ICD-10-CM | POA: Diagnosis not present

## 2019-06-10 DIAGNOSIS — G894 Chronic pain syndrome: Secondary | ICD-10-CM | POA: Diagnosis not present

## 2019-06-11 DIAGNOSIS — G4700 Insomnia, unspecified: Secondary | ICD-10-CM | POA: Diagnosis not present

## 2019-06-11 DIAGNOSIS — M1990 Unspecified osteoarthritis, unspecified site: Secondary | ICD-10-CM | POA: Diagnosis not present

## 2019-06-11 DIAGNOSIS — Z79891 Long term (current) use of opiate analgesic: Secondary | ICD-10-CM | POA: Diagnosis not present

## 2019-06-11 DIAGNOSIS — Z9181 History of falling: Secondary | ICD-10-CM | POA: Diagnosis not present

## 2019-06-11 DIAGNOSIS — Z981 Arthrodesis status: Secondary | ICD-10-CM | POA: Diagnosis not present

## 2019-06-11 DIAGNOSIS — Z87440 Personal history of urinary (tract) infections: Secondary | ICD-10-CM | POA: Diagnosis not present

## 2019-06-11 DIAGNOSIS — M797 Fibromyalgia: Secondary | ICD-10-CM | POA: Diagnosis not present

## 2019-06-11 DIAGNOSIS — G894 Chronic pain syndrome: Secondary | ICD-10-CM | POA: Diagnosis not present

## 2019-06-11 DIAGNOSIS — Z87442 Personal history of urinary calculi: Secondary | ICD-10-CM | POA: Diagnosis not present

## 2019-06-11 DIAGNOSIS — E785 Hyperlipidemia, unspecified: Secondary | ICD-10-CM | POA: Diagnosis not present

## 2019-06-11 DIAGNOSIS — E119 Type 2 diabetes mellitus without complications: Secondary | ICD-10-CM | POA: Diagnosis not present

## 2019-06-11 DIAGNOSIS — I110 Hypertensive heart disease with heart failure: Secondary | ICD-10-CM | POA: Diagnosis not present

## 2019-06-11 DIAGNOSIS — I509 Heart failure, unspecified: Secondary | ICD-10-CM | POA: Diagnosis not present

## 2019-06-11 DIAGNOSIS — J449 Chronic obstructive pulmonary disease, unspecified: Secondary | ICD-10-CM | POA: Diagnosis not present

## 2019-06-11 DIAGNOSIS — S72402K Unspecified fracture of lower end of left femur, subsequent encounter for closed fracture with nonunion: Secondary | ICD-10-CM | POA: Diagnosis not present

## 2019-06-12 ENCOUNTER — Other Ambulatory Visit: Payer: Self-pay | Admitting: Family Medicine

## 2019-06-12 DIAGNOSIS — S72402K Unspecified fracture of lower end of left femur, subsequent encounter for closed fracture with nonunion: Secondary | ICD-10-CM | POA: Diagnosis not present

## 2019-06-12 DIAGNOSIS — Z79891 Long term (current) use of opiate analgesic: Secondary | ICD-10-CM | POA: Diagnosis not present

## 2019-06-12 DIAGNOSIS — E119 Type 2 diabetes mellitus without complications: Secondary | ICD-10-CM | POA: Diagnosis not present

## 2019-06-12 DIAGNOSIS — G894 Chronic pain syndrome: Secondary | ICD-10-CM | POA: Diagnosis not present

## 2019-06-12 DIAGNOSIS — Z981 Arthrodesis status: Secondary | ICD-10-CM | POA: Diagnosis not present

## 2019-06-12 DIAGNOSIS — I509 Heart failure, unspecified: Secondary | ICD-10-CM | POA: Diagnosis not present

## 2019-06-12 DIAGNOSIS — G4700 Insomnia, unspecified: Secondary | ICD-10-CM | POA: Diagnosis not present

## 2019-06-12 DIAGNOSIS — J449 Chronic obstructive pulmonary disease, unspecified: Secondary | ICD-10-CM | POA: Diagnosis not present

## 2019-06-12 DIAGNOSIS — I110 Hypertensive heart disease with heart failure: Secondary | ICD-10-CM | POA: Diagnosis not present

## 2019-06-12 DIAGNOSIS — Z9181 History of falling: Secondary | ICD-10-CM | POA: Diagnosis not present

## 2019-06-12 DIAGNOSIS — Z87440 Personal history of urinary (tract) infections: Secondary | ICD-10-CM | POA: Diagnosis not present

## 2019-06-12 DIAGNOSIS — M1990 Unspecified osteoarthritis, unspecified site: Secondary | ICD-10-CM | POA: Diagnosis not present

## 2019-06-12 DIAGNOSIS — E785 Hyperlipidemia, unspecified: Secondary | ICD-10-CM | POA: Diagnosis not present

## 2019-06-12 DIAGNOSIS — M797 Fibromyalgia: Secondary | ICD-10-CM | POA: Diagnosis not present

## 2019-06-12 DIAGNOSIS — Z87442 Personal history of urinary calculi: Secondary | ICD-10-CM | POA: Diagnosis not present

## 2019-06-12 NOTE — Telephone Encounter (Signed)
Last office visit 04/17/2019 for DM.  Last refilled 05/14/2019 for #120 with no refills.  No future appointments.

## 2019-06-15 DIAGNOSIS — Z87442 Personal history of urinary calculi: Secondary | ICD-10-CM | POA: Diagnosis not present

## 2019-06-15 DIAGNOSIS — M1990 Unspecified osteoarthritis, unspecified site: Secondary | ICD-10-CM | POA: Diagnosis not present

## 2019-06-15 DIAGNOSIS — Z87440 Personal history of urinary (tract) infections: Secondary | ICD-10-CM | POA: Diagnosis not present

## 2019-06-15 DIAGNOSIS — E119 Type 2 diabetes mellitus without complications: Secondary | ICD-10-CM | POA: Diagnosis not present

## 2019-06-15 DIAGNOSIS — I509 Heart failure, unspecified: Secondary | ICD-10-CM | POA: Diagnosis not present

## 2019-06-15 DIAGNOSIS — Z9181 History of falling: Secondary | ICD-10-CM | POA: Diagnosis not present

## 2019-06-15 DIAGNOSIS — G4700 Insomnia, unspecified: Secondary | ICD-10-CM | POA: Diagnosis not present

## 2019-06-15 DIAGNOSIS — Z981 Arthrodesis status: Secondary | ICD-10-CM | POA: Diagnosis not present

## 2019-06-15 DIAGNOSIS — J449 Chronic obstructive pulmonary disease, unspecified: Secondary | ICD-10-CM | POA: Diagnosis not present

## 2019-06-15 DIAGNOSIS — M797 Fibromyalgia: Secondary | ICD-10-CM | POA: Diagnosis not present

## 2019-06-15 DIAGNOSIS — E785 Hyperlipidemia, unspecified: Secondary | ICD-10-CM | POA: Diagnosis not present

## 2019-06-15 DIAGNOSIS — G894 Chronic pain syndrome: Secondary | ICD-10-CM | POA: Diagnosis not present

## 2019-06-15 DIAGNOSIS — I110 Hypertensive heart disease with heart failure: Secondary | ICD-10-CM | POA: Diagnosis not present

## 2019-06-15 DIAGNOSIS — S72402K Unspecified fracture of lower end of left femur, subsequent encounter for closed fracture with nonunion: Secondary | ICD-10-CM | POA: Diagnosis not present

## 2019-06-15 DIAGNOSIS — Z79891 Long term (current) use of opiate analgesic: Secondary | ICD-10-CM | POA: Diagnosis not present

## 2019-06-17 DIAGNOSIS — E119 Type 2 diabetes mellitus without complications: Secondary | ICD-10-CM | POA: Diagnosis not present

## 2019-06-17 DIAGNOSIS — G894 Chronic pain syndrome: Secondary | ICD-10-CM | POA: Diagnosis not present

## 2019-06-17 DIAGNOSIS — I110 Hypertensive heart disease with heart failure: Secondary | ICD-10-CM | POA: Diagnosis not present

## 2019-06-17 DIAGNOSIS — G4700 Insomnia, unspecified: Secondary | ICD-10-CM | POA: Diagnosis not present

## 2019-06-17 DIAGNOSIS — Z79891 Long term (current) use of opiate analgesic: Secondary | ICD-10-CM | POA: Diagnosis not present

## 2019-06-17 DIAGNOSIS — J449 Chronic obstructive pulmonary disease, unspecified: Secondary | ICD-10-CM | POA: Diagnosis not present

## 2019-06-17 DIAGNOSIS — Z9181 History of falling: Secondary | ICD-10-CM | POA: Diagnosis not present

## 2019-06-17 DIAGNOSIS — M797 Fibromyalgia: Secondary | ICD-10-CM | POA: Diagnosis not present

## 2019-06-17 DIAGNOSIS — Z87440 Personal history of urinary (tract) infections: Secondary | ICD-10-CM | POA: Diagnosis not present

## 2019-06-17 DIAGNOSIS — S72402K Unspecified fracture of lower end of left femur, subsequent encounter for closed fracture with nonunion: Secondary | ICD-10-CM | POA: Diagnosis not present

## 2019-06-17 DIAGNOSIS — Z981 Arthrodesis status: Secondary | ICD-10-CM | POA: Diagnosis not present

## 2019-06-17 DIAGNOSIS — M1990 Unspecified osteoarthritis, unspecified site: Secondary | ICD-10-CM | POA: Diagnosis not present

## 2019-06-17 DIAGNOSIS — E785 Hyperlipidemia, unspecified: Secondary | ICD-10-CM | POA: Diagnosis not present

## 2019-06-17 DIAGNOSIS — Z87442 Personal history of urinary calculi: Secondary | ICD-10-CM | POA: Diagnosis not present

## 2019-06-17 DIAGNOSIS — I509 Heart failure, unspecified: Secondary | ICD-10-CM | POA: Diagnosis not present

## 2019-06-18 ENCOUNTER — Telehealth: Payer: Self-pay | Admitting: Family Medicine

## 2019-06-18 ENCOUNTER — Other Ambulatory Visit: Payer: Self-pay | Admitting: Family Medicine

## 2019-06-18 NOTE — Telephone Encounter (Signed)
[place] called.  Patient's last diabetic eye exam was June,2019.

## 2019-06-18 NOTE — Telephone Encounter (Signed)
Noted  

## 2019-06-19 ENCOUNTER — Telehealth: Payer: Self-pay | Admitting: Family Medicine

## 2019-06-19 DIAGNOSIS — G894 Chronic pain syndrome: Secondary | ICD-10-CM | POA: Diagnosis not present

## 2019-06-19 DIAGNOSIS — I110 Hypertensive heart disease with heart failure: Secondary | ICD-10-CM | POA: Diagnosis not present

## 2019-06-19 DIAGNOSIS — E785 Hyperlipidemia, unspecified: Secondary | ICD-10-CM | POA: Diagnosis not present

## 2019-06-19 DIAGNOSIS — Z79891 Long term (current) use of opiate analgesic: Secondary | ICD-10-CM | POA: Diagnosis not present

## 2019-06-19 DIAGNOSIS — Z981 Arthrodesis status: Secondary | ICD-10-CM | POA: Diagnosis not present

## 2019-06-19 DIAGNOSIS — Z87442 Personal history of urinary calculi: Secondary | ICD-10-CM | POA: Diagnosis not present

## 2019-06-19 DIAGNOSIS — M797 Fibromyalgia: Secondary | ICD-10-CM | POA: Diagnosis not present

## 2019-06-19 DIAGNOSIS — M1990 Unspecified osteoarthritis, unspecified site: Secondary | ICD-10-CM | POA: Diagnosis not present

## 2019-06-19 DIAGNOSIS — I509 Heart failure, unspecified: Secondary | ICD-10-CM | POA: Diagnosis not present

## 2019-06-19 DIAGNOSIS — E119 Type 2 diabetes mellitus without complications: Secondary | ICD-10-CM | POA: Diagnosis not present

## 2019-06-19 DIAGNOSIS — S72402K Unspecified fracture of lower end of left femur, subsequent encounter for closed fracture with nonunion: Secondary | ICD-10-CM | POA: Diagnosis not present

## 2019-06-19 DIAGNOSIS — G4700 Insomnia, unspecified: Secondary | ICD-10-CM | POA: Diagnosis not present

## 2019-06-19 DIAGNOSIS — Z9181 History of falling: Secondary | ICD-10-CM | POA: Diagnosis not present

## 2019-06-19 DIAGNOSIS — Z87440 Personal history of urinary (tract) infections: Secondary | ICD-10-CM | POA: Diagnosis not present

## 2019-06-19 DIAGNOSIS — J449 Chronic obstructive pulmonary disease, unspecified: Secondary | ICD-10-CM | POA: Diagnosis not present

## 2019-06-19 NOTE — Telephone Encounter (Signed)
Noted  

## 2019-06-19 NOTE — Telephone Encounter (Signed)
Nickole @ [facility]  Best number [phone]  Had to miss PT today because the patient was unable to schedule

## 2019-06-19 NOTE — Telephone Encounter (Signed)
Last office visit 04/17/2019 for DM.  Last refilled 05/04/2019 for #90 with no refills.  No future appointments.

## 2019-06-22 DIAGNOSIS — E785 Hyperlipidemia, unspecified: Secondary | ICD-10-CM | POA: Diagnosis not present

## 2019-06-22 DIAGNOSIS — Z981 Arthrodesis status: Secondary | ICD-10-CM | POA: Diagnosis not present

## 2019-06-22 DIAGNOSIS — Z87442 Personal history of urinary calculi: Secondary | ICD-10-CM | POA: Diagnosis not present

## 2019-06-22 DIAGNOSIS — Z87440 Personal history of urinary (tract) infections: Secondary | ICD-10-CM | POA: Diagnosis not present

## 2019-06-22 DIAGNOSIS — Z79891 Long term (current) use of opiate analgesic: Secondary | ICD-10-CM | POA: Diagnosis not present

## 2019-06-22 DIAGNOSIS — M1990 Unspecified osteoarthritis, unspecified site: Secondary | ICD-10-CM | POA: Diagnosis not present

## 2019-06-22 DIAGNOSIS — M797 Fibromyalgia: Secondary | ICD-10-CM | POA: Diagnosis not present

## 2019-06-22 DIAGNOSIS — G894 Chronic pain syndrome: Secondary | ICD-10-CM | POA: Diagnosis not present

## 2019-06-22 DIAGNOSIS — I509 Heart failure, unspecified: Secondary | ICD-10-CM | POA: Diagnosis not present

## 2019-06-22 DIAGNOSIS — Z9181 History of falling: Secondary | ICD-10-CM | POA: Diagnosis not present

## 2019-06-22 DIAGNOSIS — J449 Chronic obstructive pulmonary disease, unspecified: Secondary | ICD-10-CM | POA: Diagnosis not present

## 2019-06-22 DIAGNOSIS — S72402K Unspecified fracture of lower end of left femur, subsequent encounter for closed fracture with nonunion: Secondary | ICD-10-CM | POA: Diagnosis not present

## 2019-06-22 DIAGNOSIS — G4700 Insomnia, unspecified: Secondary | ICD-10-CM | POA: Diagnosis not present

## 2019-06-22 DIAGNOSIS — I110 Hypertensive heart disease with heart failure: Secondary | ICD-10-CM | POA: Diagnosis not present

## 2019-06-22 DIAGNOSIS — E119 Type 2 diabetes mellitus without complications: Secondary | ICD-10-CM | POA: Diagnosis not present

## 2019-06-24 ENCOUNTER — Other Ambulatory Visit: Payer: Self-pay | Admitting: Family Medicine

## 2019-06-24 DIAGNOSIS — Z79891 Long term (current) use of opiate analgesic: Secondary | ICD-10-CM | POA: Diagnosis not present

## 2019-06-24 DIAGNOSIS — G894 Chronic pain syndrome: Secondary | ICD-10-CM | POA: Diagnosis not present

## 2019-06-24 DIAGNOSIS — E119 Type 2 diabetes mellitus without complications: Secondary | ICD-10-CM | POA: Diagnosis not present

## 2019-06-24 DIAGNOSIS — Z981 Arthrodesis status: Secondary | ICD-10-CM | POA: Diagnosis not present

## 2019-06-24 DIAGNOSIS — Z9181 History of falling: Secondary | ICD-10-CM | POA: Diagnosis not present

## 2019-06-24 DIAGNOSIS — E785 Hyperlipidemia, unspecified: Secondary | ICD-10-CM | POA: Diagnosis not present

## 2019-06-24 DIAGNOSIS — I509 Heart failure, unspecified: Secondary | ICD-10-CM | POA: Diagnosis not present

## 2019-06-24 DIAGNOSIS — I110 Hypertensive heart disease with heart failure: Secondary | ICD-10-CM | POA: Diagnosis not present

## 2019-06-24 DIAGNOSIS — S72402K Unspecified fracture of lower end of left femur, subsequent encounter for closed fracture with nonunion: Secondary | ICD-10-CM | POA: Diagnosis not present

## 2019-06-24 DIAGNOSIS — Z87440 Personal history of urinary (tract) infections: Secondary | ICD-10-CM | POA: Diagnosis not present

## 2019-06-24 DIAGNOSIS — J449 Chronic obstructive pulmonary disease, unspecified: Secondary | ICD-10-CM | POA: Diagnosis not present

## 2019-06-24 DIAGNOSIS — M1990 Unspecified osteoarthritis, unspecified site: Secondary | ICD-10-CM | POA: Diagnosis not present

## 2019-06-24 DIAGNOSIS — Z87442 Personal history of urinary calculi: Secondary | ICD-10-CM | POA: Diagnosis not present

## 2019-06-24 DIAGNOSIS — M797 Fibromyalgia: Secondary | ICD-10-CM | POA: Diagnosis not present

## 2019-06-24 DIAGNOSIS — G4700 Insomnia, unspecified: Secondary | ICD-10-CM | POA: Diagnosis not present

## 2019-06-24 NOTE — Telephone Encounter (Signed)
Last office visit 04/17/2019 for DM.  Last Lipid 02/2018.  Last refilled 06/05/2018 for #60 with 5 refills.  No future appointments.

## 2019-06-26 DIAGNOSIS — Z9181 History of falling: Secondary | ICD-10-CM | POA: Diagnosis not present

## 2019-06-26 DIAGNOSIS — M1990 Unspecified osteoarthritis, unspecified site: Secondary | ICD-10-CM | POA: Diagnosis not present

## 2019-06-26 DIAGNOSIS — S72492S Other fracture of lower end of left femur, sequela: Secondary | ICD-10-CM | POA: Diagnosis not present

## 2019-06-26 DIAGNOSIS — J449 Chronic obstructive pulmonary disease, unspecified: Secondary | ICD-10-CM | POA: Diagnosis not present

## 2019-06-26 DIAGNOSIS — E785 Hyperlipidemia, unspecified: Secondary | ICD-10-CM | POA: Diagnosis not present

## 2019-06-26 DIAGNOSIS — Z79891 Long term (current) use of opiate analgesic: Secondary | ICD-10-CM | POA: Diagnosis not present

## 2019-06-26 DIAGNOSIS — I110 Hypertensive heart disease with heart failure: Secondary | ICD-10-CM | POA: Diagnosis not present

## 2019-06-26 DIAGNOSIS — S72402K Unspecified fracture of lower end of left femur, subsequent encounter for closed fracture with nonunion: Secondary | ICD-10-CM | POA: Diagnosis not present

## 2019-06-26 DIAGNOSIS — G894 Chronic pain syndrome: Secondary | ICD-10-CM | POA: Diagnosis not present

## 2019-06-26 DIAGNOSIS — Z87442 Personal history of urinary calculi: Secondary | ICD-10-CM | POA: Diagnosis not present

## 2019-06-26 DIAGNOSIS — I509 Heart failure, unspecified: Secondary | ICD-10-CM | POA: Diagnosis not present

## 2019-06-26 DIAGNOSIS — S72142D Displaced intertrochanteric fracture of left femur, subsequent encounter for closed fracture with routine healing: Secondary | ICD-10-CM | POA: Diagnosis not present

## 2019-06-26 DIAGNOSIS — E119 Type 2 diabetes mellitus without complications: Secondary | ICD-10-CM | POA: Diagnosis not present

## 2019-06-26 DIAGNOSIS — Z87440 Personal history of urinary (tract) infections: Secondary | ICD-10-CM | POA: Diagnosis not present

## 2019-06-26 DIAGNOSIS — Z981 Arthrodesis status: Secondary | ICD-10-CM | POA: Diagnosis not present

## 2019-06-26 DIAGNOSIS — G4700 Insomnia, unspecified: Secondary | ICD-10-CM | POA: Diagnosis not present

## 2019-06-26 DIAGNOSIS — M797 Fibromyalgia: Secondary | ICD-10-CM | POA: Diagnosis not present

## 2019-06-27 LAB — FUNGAL ORGANISM REFLEX

## 2019-06-27 LAB — FUNGUS CULTURE WITH STAIN

## 2019-06-27 LAB — FUNGUS CULTURE RESULT

## 2019-06-29 ENCOUNTER — Telehealth: Payer: Self-pay

## 2019-06-29 DIAGNOSIS — E119 Type 2 diabetes mellitus without complications: Secondary | ICD-10-CM | POA: Diagnosis not present

## 2019-06-29 DIAGNOSIS — S72402K Unspecified fracture of lower end of left femur, subsequent encounter for closed fracture with nonunion: Secondary | ICD-10-CM | POA: Diagnosis not present

## 2019-06-29 DIAGNOSIS — Z981 Arthrodesis status: Secondary | ICD-10-CM | POA: Diagnosis not present

## 2019-06-29 DIAGNOSIS — G894 Chronic pain syndrome: Secondary | ICD-10-CM | POA: Diagnosis not present

## 2019-06-29 DIAGNOSIS — E785 Hyperlipidemia, unspecified: Secondary | ICD-10-CM | POA: Diagnosis not present

## 2019-06-29 DIAGNOSIS — I509 Heart failure, unspecified: Secondary | ICD-10-CM | POA: Diagnosis not present

## 2019-06-29 DIAGNOSIS — Z87440 Personal history of urinary (tract) infections: Secondary | ICD-10-CM | POA: Diagnosis not present

## 2019-06-29 DIAGNOSIS — Z9181 History of falling: Secondary | ICD-10-CM | POA: Diagnosis not present

## 2019-06-29 DIAGNOSIS — M797 Fibromyalgia: Secondary | ICD-10-CM | POA: Diagnosis not present

## 2019-06-29 DIAGNOSIS — Z79891 Long term (current) use of opiate analgesic: Secondary | ICD-10-CM | POA: Diagnosis not present

## 2019-06-29 DIAGNOSIS — J449 Chronic obstructive pulmonary disease, unspecified: Secondary | ICD-10-CM | POA: Diagnosis not present

## 2019-06-29 DIAGNOSIS — M1990 Unspecified osteoarthritis, unspecified site: Secondary | ICD-10-CM | POA: Diagnosis not present

## 2019-06-29 DIAGNOSIS — G4700 Insomnia, unspecified: Secondary | ICD-10-CM | POA: Diagnosis not present

## 2019-06-29 DIAGNOSIS — Z87442 Personal history of urinary calculi: Secondary | ICD-10-CM | POA: Diagnosis not present

## 2019-06-29 DIAGNOSIS — I110 Hypertensive heart disease with heart failure: Secondary | ICD-10-CM | POA: Diagnosis not present

## 2019-06-29 NOTE — Telephone Encounter (Signed)
Client [place] Night - Client Client Site [place] Physician Eliezer Lofts - MD Contact Type Call Who Is Calling Patient / Member / Family / Caregiver Caller Name [place] Phone Number [phone] Patient Name Sandra Ferrell. [place] Patient DOB 12-Apr-1961 Call Type Message Only Information Provided Reason for Call Request for General Office Information Initial Comment Caller states is with Homehealth, wants to inform about patient's home situation. Will put arequest for Social Worker to come out. Patient is not being cleaned. Additional Comment Thank You Call Closed By: Margaretmary Bayley Transaction Date/Time: 06/26/2019 6:28:06 PM (ET)

## 2019-06-29 NOTE — Telephone Encounter (Signed)
Sandra Ferrell with [facility] left v/m; pt has open areas from laying in urine on pts bottom. CNA applied barrier cream but that will not help if pt continues to lay in urine because her husband will not change her. [place] request cb.

## 2019-06-29 NOTE — Telephone Encounter (Signed)
  Spoke with Jinny Blossom, CNA with Home Health. She has contacted patient's case manager and contacted nursing to set up social work services for the patient. Megan and P.T and O.T therapists have witnessed patient been left laying in her own urine for hours. Patient's husband is not turning patient over as needs to be, he yells at patient for been wet, he is not applying the right cream to her bottom and thinks vaginal cream is for the bottom area too. He did not want Megan to change patient's under pads because of the cost of this and how often it needs to be done. Patient's bottom is red and sore and the skin is starting to break down. Jinny Blossom gave husband barrier cream to use on patient's bottom but Jinny Blossom does not think he will actually do this. Jinny Blossom is trying to keep patient from getting sore on her bottom and ending up at the hospital. This is an FYI to Dr [name], home health will call if they need any verbal orders. If Dr [name] has any further recommendations then please give them a call at [phone].

## 2019-06-30 ENCOUNTER — Telehealth: Payer: Self-pay

## 2019-06-30 ENCOUNTER — Encounter: Payer: Self-pay | Admitting: Family Medicine

## 2019-06-30 ENCOUNTER — Ambulatory Visit (INDEPENDENT_AMBULATORY_CARE_PROVIDER_SITE_OTHER): Payer: Medicare Other | Admitting: Family Medicine

## 2019-06-30 VITALS — BP 153/86 | HR 93 | Temp 97.4°F | Resp 14

## 2019-06-30 DIAGNOSIS — E785 Hyperlipidemia, unspecified: Secondary | ICD-10-CM | POA: Diagnosis not present

## 2019-06-30 DIAGNOSIS — S72402K Unspecified fracture of lower end of left femur, subsequent encounter for closed fracture with nonunion: Secondary | ICD-10-CM | POA: Diagnosis not present

## 2019-06-30 DIAGNOSIS — G4700 Insomnia, unspecified: Secondary | ICD-10-CM | POA: Diagnosis not present

## 2019-06-30 DIAGNOSIS — M1990 Unspecified osteoarthritis, unspecified site: Secondary | ICD-10-CM | POA: Diagnosis not present

## 2019-06-30 DIAGNOSIS — G894 Chronic pain syndrome: Secondary | ICD-10-CM | POA: Diagnosis not present

## 2019-06-30 DIAGNOSIS — E119 Type 2 diabetes mellitus without complications: Secondary | ICD-10-CM

## 2019-06-30 DIAGNOSIS — I10 Essential (primary) hypertension: Secondary | ICD-10-CM | POA: Diagnosis not present

## 2019-06-30 DIAGNOSIS — Z79891 Long term (current) use of opiate analgesic: Secondary | ICD-10-CM | POA: Diagnosis not present

## 2019-06-30 DIAGNOSIS — I110 Hypertensive heart disease with heart failure: Secondary | ICD-10-CM | POA: Diagnosis not present

## 2019-06-30 DIAGNOSIS — Z981 Arthrodesis status: Secondary | ICD-10-CM | POA: Diagnosis not present

## 2019-06-30 DIAGNOSIS — Z87440 Personal history of urinary (tract) infections: Secondary | ICD-10-CM | POA: Diagnosis not present

## 2019-06-30 DIAGNOSIS — J449 Chronic obstructive pulmonary disease, unspecified: Secondary | ICD-10-CM | POA: Diagnosis not present

## 2019-06-30 DIAGNOSIS — Z87442 Personal history of urinary calculi: Secondary | ICD-10-CM | POA: Diagnosis not present

## 2019-06-30 DIAGNOSIS — M797 Fibromyalgia: Secondary | ICD-10-CM | POA: Diagnosis not present

## 2019-06-30 DIAGNOSIS — L89899 Pressure ulcer of other site, unspecified stage: Secondary | ICD-10-CM | POA: Diagnosis not present

## 2019-06-30 DIAGNOSIS — I509 Heart failure, unspecified: Secondary | ICD-10-CM | POA: Diagnosis not present

## 2019-06-30 DIAGNOSIS — Z9181 History of falling: Secondary | ICD-10-CM | POA: Diagnosis not present
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
## 2019-06-30 NOTE — Telephone Encounter (Signed)
Noted  

## 2019-06-30 NOTE — Progress Notes (Signed)
VIRTUAL VISIT Due to national recommendations of social distancing due to Zinc 19, a virtual visit is felt to be most appropriate for this patient at this time.   I connected with the patient on 06/30/19 at  2:40 PM EDT by virtual telehealth platform and verified that I am speaking with the correct person using two identifiers.   Interactive audio and video telecommunications were attempted between this provider and patient, however failed, due to patient having technical difficulties OR patient did not have access to video capability.  We continued and completed visit with audio only.   I discussed the limitations, risks, security and privacy concerns of performing an evaluation and management service by  virtual telehealth platform and the availability of in person appointments. I also discussed with the patient that there may be a patient responsible charge related to this service. The patient expressed understanding and agreed to proceed.  Patient location: Home Provider Location: [place] Participants: Eliezer Lofts and [name]   Chief Complaint  Patient presents with  . Follow-up    History of Present Illness:  58 year old diabetic female with closed fracture of left femur with nonunion and prolonged recovery time presents for follow up.  pt has not been ambulatory since initial injury. Repair of nonunion left femur surgery by Dr. Doreatha Martin on 05/29/2019 She has not be ambulatory yet. Has follow up 07/07/2019 for repeat X-ray.  She is doing PT, OT.   She reports pain level improved overall.  [facility] CMA Megan.. reports  Per phone note "She Museum/gallery curator)  has contacted patient's case manager and contacted nursing to set up social work services for the patient. Megan and P.T and O.T therapists have witnessed patient been left laying in her own urine for hours. Patient's husband is not turning patient over as needs to be, he yells at patient for been wet, he is not applying  the right cream to her bottom and thinks vaginal cream is for the bottom area too. He did not want Megan to change patient's under pads because of the cost of this and how often it needs to be done. Patient's bottom is red and sore and the skin is starting to break down. Jinny Blossom gave husband barrier cream to use on patient's bottom but Jinny Blossom does not think he will actually do this."   Also new request for [facility] nursing to see pt for pressure ulcer on left calf.  Area is blister, red. No fever.  She reports areas on vaginal area is helping area improving.  She has a [employer] fast streamer... she is trying to get up every day.  DM  Improving control on  Lantus 35 units, metformin. actos  Now more compliant with meds... A1C down from 13.7 on 03/25/19 Lab Results  Component Value Date   HGBA1C 7.7 (H) 05/29/2019   FBS 99-151.Marland Kitchen occ 59 the next day after  2 hours after meals  She has been watching more what she eats.   Wt Readings from Last 3 Encounters:  05/29/19 189 lb (85.7 kg)  03/27/19 190 lb 11.2 oz (86.5 kg)  12/25/18 230 lb (104.3 kg)     COVID 19 screen No recent travel or known exposure to [place] The patient denies respiratory symptoms of COVID 19 at this time.  The importance of social distancing was discussed today.   Review of Systems  Constitutional: Positive for malaise/fatigue and weight loss. Negative for chills and fever.  HENT: Negative for congestion and  ear pain.   Eyes: Negative for pain and redness.  Respiratory: Negative for cough and shortness of breath.   Cardiovascular: Negative for chest pain, palpitations and leg swelling.  Gastrointestinal: Negative for abdominal pain, blood in stool, constipation, diarrhea, nausea and vomiting.  Genitourinary: Negative for dysuria.  Musculoskeletal: Positive for myalgias. Negative for falls.  Skin: Negative for rash.  Neurological: Negative for dizziness.  Psychiatric/Behavioral: Negative for depression. The patient is not  nervous/anxious.       Past Medical History:  Diagnosis Date  . Allergic rhinitis, cause unspecified   . Anemia   . Anxiety state, unspecified   . Backache, unspecified   . Carpal tunnel syndrome, right    Bilateral  . CHF (congestive heart failure) ([place])    unspecified , patient denies  . Chronic pain syndrome   . Constipation   . COPD (chronic obstructive pulmonary disease) ([place])   . Cough   . Depressive disorder, not elsewhere classified    managed with medications  . Difficulty in walking   . Displaced intertrochanteric fracture of left femur ([place])   . Esophageal reflux   . Extrinsic asthma with exacerbation   . Fibromyalgia   . Heart murmur    "a small one"  . History of kidney stones   . Hyperlipidemia   . Hypertension   . Muscle weakness (generalized)   . Nicotine dependence, cigarettes, uncomplicated   . Osteoarthrosis, unspecified whether generalized or localized, unspecified site   . Other abnormalities of gait and mobility   . Other and unspecified hyperlipidemia   . Other irritable bowel syndrome   . Other screening mammogram   . Personal history of unspecified urinary disorder   . Pneumonia   . Pressure ulcer of sacral region   . Routine general medical examination at a health care facility   . Routine gynecological examination   . Tobacco use disorder   . Type II or unspecified type diabetes mellitus without mention of complication, not stated as uncontrolled    Type II  . Unspecified fall, subsequent encounter   . Unspecified sleep apnea   . Urinary tract infection   . Vaginitis   . Wears glasses   . Wheezing     reports that she quit smoking about 12 months ago. Her smoking use included cigarettes. She has a 35.00 pack-year smoking history. She has never used smokeless tobacco. She reports that she does not drink alcohol or use drugs.   Current Outpatient Medications:  .  ACCU-CHEK SOFTCLIX LANCETS lancets, CHECK BLOOD SUGAR TWICE DAILY AND AS  DIRECTED, Disp: 100 each, Rfl: 11 .  acetaminophen (TYLENOL) 650 MG CR tablet, Take 1,300 mg by mouth every 8 (eight) hours as needed for pain., Disp: , Rfl:  .  AIMSCO INSULIN SYR ULTRA THIN 31G X 5/16" 0.3 ML MISC, , Disp: , Rfl:  .  Alcohol Swabs PADS, Check blood sugar twice a day and as directed. Dx E11.9, Disp: 100 each, Rfl: 5 .  ALPRAZolam (XANAX) 0.5 MG tablet, TAKE 1 TABLET(0.5 MG) BY MOUTH THREE TIMES DAILY PRN ANXIETY, Disp: 90 tablet, Rfl: 0 .  amitriptyline (ELAVIL) 75 MG tablet, TAKE 1 TABLET(75 MG) BY MOUTH AT BEDTIME, Disp: 30 tablet, Rfl: 5 .  B-D ULTRAFINE III SHORT PEN 31G X 8 MM MISC, USE AS DIRECTED WITH INSULIN PEN, Disp: 100 each, Rfl: 11 .  Blood Glucose Monitoring Suppl (ACCU-CHEK AVIVA PLUS) w/Device KIT, Check blood sugar twice daily and as directed., Disp: 1 kit,  Rfl: 0 .  budesonide-formoterol (SYMBICORT) 160-4.5 MCG/ACT inhaler, INHALE 2 PUFFS INTO THE LUNGS TWICE DAILY, Disp: 10.2 g, Rfl: 2 .  chlorpheniramine (CHLOR-TRIMETON) 4 MG tablet, Take 8 mg by mouth 2 (two) times daily as needed for allergies., Disp: , Rfl:  .  enoxaparin (LOVENOX) 40 MG/0.4ML injection, Inject 0.4 mLs (40 mg total) into the skin daily., Disp: 10 mL, Rfl: 0 .  feeding supplement, GLUCERNA SHAKE, (GLUCERNA SHAKE) LIQD, Take 237 mLs by mouth 2 (two) times daily between meals. (Patient taking differently: Take 237 mLs by mouth daily. ), Disp: 10 Can, Rfl: 0 .  furosemide (LASIX) 20 MG tablet, TAKE 1 TABLET(20 MG) BY MOUTH DAILY, Disp: 90 tablet, Rfl: 0 .  gabapentin (NEURONTIN) 300 MG capsule, TAKE ONE CAPSULE BY MOUTH IN THE MORNING, 1 CAPSULE IN THE AFTERNOON, AND 2 CAPSULES AT BEDTIME, Disp: 120 capsule, Rfl: 0 .  gemfibrozil (LOPID) 600 MG tablet, Take 1 tablet (600 mg total) by mouth 2 (two) times daily before a meal., Disp: 60 tablet, Rfl: 11 .  glucose blood (ACCU-CHEK AVIVA PLUS) test strip, TEST BLOOD SUGAR TWICE DAILY AND DIRECTED, Disp: 100 each, Rfl: 11 .  Insulin Glargine (LANTUS  SOLOSTAR) 100 UNIT/ML Solostar Pen, ADMINISTER 35 UNITS UNDER THE SKIN AT BEDTIME (Patient taking differently: Inject 35 Units into the skin at bedtime as needed (if blood sugar is over 250). ), Disp: 15 mL, Rfl: 5 .  Lancet Devices (ADJUSTABLE LANCING DEVICE) MISC, Check blood sugar twice a day and as directed. Dx E11.9, Disp: 100 each, Rfl: 5 .  Menthol-Methyl Salicylate (SALONPAS PAIN RELIEF PATCH) PTCH, Apply 1 patch topically daily as needed (pain)., Disp: , Rfl:  .  metFORMIN (GLUCOPHAGE) 1000 MG tablet, Take 1,000 mg by mouth 2 (two) times daily with a meal., Disp: , Rfl:  .  methocarbamol (ROBAXIN) 500 MG tablet, Take 2 tablets (1,000 mg total) by mouth 3 times/day as needed-between meals & bedtime for muscle spasms., Disp: 60 tablet, Rfl: 2 .  omeprazole (PRILOSEC OTC) 20 MG tablet, Take 20 mg by mouth daily. , Disp: , Rfl:  .  OVER THE COUNTER MEDICATION, Apply 1 application topically daily as needed (pain). Hemp oil, Disp: , Rfl:  .  oxyCODONE 10 MG TABS, Take 1 tablet (10 mg total) by mouth every 4 (four) hours as needed for severe pain., Disp: 42 tablet, Rfl: 0 .  pioglitazone (ACTOS) 45 MG tablet, TAKE 1 TABLET BY MOUTH EVERY DAY (Patient taking differently: Take 45 mg by mouth daily. ), Disp: 90 tablet, Rfl: 1 .  PROAIR HFA 108 (90 Base) MCG/ACT inhaler, TAKE 2 PUFFS BY MOUTH EVERY 4 HOURS AS NEEDED FOR WHEEZE OR FOR SHORTNESS OF BREATH, Disp: 8.5 Inhaler, Rfl: 5 .  Vitamin D, Ergocalciferol, (DRISDOL) 1.25 MG (50000 UT) CAPS capsule, Take 1 capsule (50,000 Units total) by mouth every 7 (seven) days., Disp: 5 capsule, Rfl: 0   Observations/Objective: Blood pressure (!) 153/86, pulse 93, temperature (!) 97.4 F (36.3 C), temperature source Temporal, resp. rate 14, SpO2 97 %.  Physical Exam  Physical Exam Constitutional:      General: The patient is not in acute distress. Pulmonary:     Effort: Pulmonary effort is normal. No respiratory distress.  Neurological:     Mental Status:  The patient is alert and oriented to person, place, and time.  Psychiatric:        Mood and Affect: Mood normal.        Behavior: Behavior normal.  Assessment and Plan Diabetes mellitus with no complication ([place]) Taking to much pioglitazone. Encouraged same medication regimen daily and we can decrease if needed. Needs to follow CBGs closely. Take pioglitazone once daily.  Take metfomrin 2 times daily.  Hold lantus.  Call with fasting blood sugars  and once a day check 2 hours after a meal.   Essential hypertension, benign Well controlled. Continue current medication.   Pressure ulcer of left leg Referral to [facility] for wound care made prior to appt. Encouraged pt to move frequently, rotate body and continue moving forward with PT.      I discussed the assessment and treatment plan with the patient. The patient was provided an opportunity to ask questions and all were answered. The patient agreed with the plan and demonstrated an understanding of the instructions.   The patient was advised to call back or seek an in-person evaluation if the symptoms worsen or if the condition fails to improve as anticipated.  Total visit time 25 minutes, > 50% spent counseling and cordinating patients care.     Eliezer Lofts, MD

## 2019-06-30 NOTE — Patient Instructions (Addendum)
Take pioglitazone once daily.  Take metfomrin 2 times daily.  Hold lantus.  Call with fasting blood sugars  and  Once a day check 2 hours after a meal.

## 2019-06-30 NOTE — Telephone Encounter (Signed)
Verbal order given to [facility] for [facility] nursing to see patient for pressure ulcer on left calf.
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
## 2019-06-30 NOTE — Telephone Encounter (Signed)
Estill Bamberg OT with [facility] HH left v/m requesting verbal order for [facility] nursing to see pt for pressure ulcer on Lt calf.

## 2019-07-01 DIAGNOSIS — I509 Heart failure, unspecified: Secondary | ICD-10-CM | POA: Diagnosis not present

## 2019-07-01 DIAGNOSIS — Z981 Arthrodesis status: Secondary | ICD-10-CM | POA: Diagnosis not present

## 2019-07-01 DIAGNOSIS — G4700 Insomnia, unspecified: Secondary | ICD-10-CM | POA: Diagnosis not present

## 2019-07-01 DIAGNOSIS — E119 Type 2 diabetes mellitus without complications: Secondary | ICD-10-CM | POA: Diagnosis not present

## 2019-07-01 DIAGNOSIS — E785 Hyperlipidemia, unspecified: Secondary | ICD-10-CM | POA: Diagnosis not present

## 2019-07-01 DIAGNOSIS — Z9181 History of falling: Secondary | ICD-10-CM | POA: Diagnosis not present

## 2019-07-01 DIAGNOSIS — J449 Chronic obstructive pulmonary disease, unspecified: Secondary | ICD-10-CM | POA: Diagnosis not present

## 2019-07-01 DIAGNOSIS — Z87440 Personal history of urinary (tract) infections: Secondary | ICD-10-CM | POA: Diagnosis not present

## 2019-07-01 DIAGNOSIS — I110 Hypertensive heart disease with heart failure: Secondary | ICD-10-CM | POA: Diagnosis not present

## 2019-07-01 DIAGNOSIS — M797 Fibromyalgia: Secondary | ICD-10-CM | POA: Diagnosis not present

## 2019-07-01 DIAGNOSIS — Z87442 Personal history of urinary calculi: Secondary | ICD-10-CM | POA: Diagnosis not present

## 2019-07-01 DIAGNOSIS — G894 Chronic pain syndrome: Secondary | ICD-10-CM | POA: Diagnosis not present

## 2019-07-01 DIAGNOSIS — S72402K Unspecified fracture of lower end of left femur, subsequent encounter for closed fracture with nonunion: Secondary | ICD-10-CM | POA: Diagnosis not present

## 2019-07-01 DIAGNOSIS — Z79891 Long term (current) use of opiate analgesic: Secondary | ICD-10-CM | POA: Diagnosis not present

## 2019-07-01 DIAGNOSIS — M1990 Unspecified osteoarthritis, unspecified site: Secondary | ICD-10-CM | POA: Diagnosis not present

## 2019-07-02 DIAGNOSIS — Z87442 Personal history of urinary calculi: Secondary | ICD-10-CM | POA: Diagnosis not present

## 2019-07-02 DIAGNOSIS — E119 Type 2 diabetes mellitus without complications: Secondary | ICD-10-CM | POA: Diagnosis not present

## 2019-07-02 DIAGNOSIS — G4700 Insomnia, unspecified: Secondary | ICD-10-CM | POA: Diagnosis not present

## 2019-07-02 DIAGNOSIS — S72402K Unspecified fracture of lower end of left femur, subsequent encounter for closed fracture with nonunion: Secondary | ICD-10-CM | POA: Diagnosis not present

## 2019-07-02 DIAGNOSIS — Z9181 History of falling: Secondary | ICD-10-CM | POA: Diagnosis not present

## 2019-07-02 DIAGNOSIS — Z79891 Long term (current) use of opiate analgesic: Secondary | ICD-10-CM | POA: Diagnosis not present

## 2019-07-02 DIAGNOSIS — I509 Heart failure, unspecified: Secondary | ICD-10-CM | POA: Diagnosis not present

## 2019-07-02 DIAGNOSIS — E785 Hyperlipidemia, unspecified: Secondary | ICD-10-CM | POA: Diagnosis not present

## 2019-07-02 DIAGNOSIS — I110 Hypertensive heart disease with heart failure: Secondary | ICD-10-CM | POA: Diagnosis not present

## 2019-07-02 DIAGNOSIS — Z981 Arthrodesis status: Secondary | ICD-10-CM | POA: Diagnosis not present

## 2019-07-02 DIAGNOSIS — J449 Chronic obstructive pulmonary disease, unspecified: Secondary | ICD-10-CM | POA: Diagnosis not present

## 2019-07-02 DIAGNOSIS — M797 Fibromyalgia: Secondary | ICD-10-CM | POA: Diagnosis not present

## 2019-07-02 DIAGNOSIS — Z87440 Personal history of urinary (tract) infections: Secondary | ICD-10-CM | POA: Diagnosis not present

## 2019-07-02 DIAGNOSIS — M1990 Unspecified osteoarthritis, unspecified site: Secondary | ICD-10-CM | POA: Diagnosis not present

## 2019-07-02 DIAGNOSIS — G894 Chronic pain syndrome: Secondary | ICD-10-CM | POA: Diagnosis not present

## 2019-07-03 DIAGNOSIS — J449 Chronic obstructive pulmonary disease, unspecified: Secondary | ICD-10-CM | POA: Diagnosis not present

## 2019-07-03 DIAGNOSIS — M797 Fibromyalgia: Secondary | ICD-10-CM | POA: Diagnosis not present

## 2019-07-03 DIAGNOSIS — E785 Hyperlipidemia, unspecified: Secondary | ICD-10-CM | POA: Diagnosis not present

## 2019-07-03 DIAGNOSIS — Z87442 Personal history of urinary calculi: Secondary | ICD-10-CM | POA: Diagnosis not present

## 2019-07-03 DIAGNOSIS — E119 Type 2 diabetes mellitus without complications: Secondary | ICD-10-CM | POA: Diagnosis not present

## 2019-07-03 DIAGNOSIS — Z9181 History of falling: Secondary | ICD-10-CM | POA: Diagnosis not present

## 2019-07-03 DIAGNOSIS — M1990 Unspecified osteoarthritis, unspecified site: Secondary | ICD-10-CM | POA: Diagnosis not present

## 2019-07-03 DIAGNOSIS — G4700 Insomnia, unspecified: Secondary | ICD-10-CM | POA: Diagnosis not present

## 2019-07-03 DIAGNOSIS — Z981 Arthrodesis status: Secondary | ICD-10-CM | POA: Diagnosis not present

## 2019-07-03 DIAGNOSIS — S72402K Unspecified fracture of lower end of left femur, subsequent encounter for closed fracture with nonunion: Secondary | ICD-10-CM | POA: Diagnosis not present

## 2019-07-03 DIAGNOSIS — I110 Hypertensive heart disease with heart failure: Secondary | ICD-10-CM | POA: Diagnosis not present

## 2019-07-03 DIAGNOSIS — Z79891 Long term (current) use of opiate analgesic: Secondary | ICD-10-CM | POA: Diagnosis not present

## 2019-07-03 DIAGNOSIS — G894 Chronic pain syndrome: Secondary | ICD-10-CM | POA: Diagnosis not present

## 2019-07-03 DIAGNOSIS — I509 Heart failure, unspecified: Secondary | ICD-10-CM | POA: Diagnosis not present

## 2019-07-03 DIAGNOSIS — Z87440 Personal history of urinary (tract) infections: Secondary | ICD-10-CM | POA: Diagnosis not present

## 2019-07-05 DIAGNOSIS — S72492S Other fracture of lower end of left femur, sequela: Secondary | ICD-10-CM | POA: Diagnosis not present

## 2019-07-05 DIAGNOSIS — S72452K Displaced supracondylar fracture without intracondylar extension of lower end of left femur, subsequent encounter for closed fracture with nonunion: Secondary | ICD-10-CM | POA: Diagnosis not present

## 2019-07-05 DIAGNOSIS — S72142D Displaced intertrochanteric fracture of left femur, subsequent encounter for closed fracture with routine healing: Secondary | ICD-10-CM | POA: Diagnosis not present

## 2019-07-06 DIAGNOSIS — Z981 Arthrodesis status: Secondary | ICD-10-CM | POA: Diagnosis not present

## 2019-07-06 DIAGNOSIS — Z87442 Personal history of urinary calculi: Secondary | ICD-10-CM | POA: Diagnosis not present

## 2019-07-06 DIAGNOSIS — Z9181 History of falling: Secondary | ICD-10-CM | POA: Diagnosis not present

## 2019-07-06 DIAGNOSIS — S72402K Unspecified fracture of lower end of left femur, subsequent encounter for closed fracture with nonunion: Secondary | ICD-10-CM | POA: Diagnosis not present

## 2019-07-06 DIAGNOSIS — G4700 Insomnia, unspecified: Secondary | ICD-10-CM | POA: Diagnosis not present

## 2019-07-06 DIAGNOSIS — E785 Hyperlipidemia, unspecified: Secondary | ICD-10-CM | POA: Diagnosis not present

## 2019-07-06 DIAGNOSIS — E119 Type 2 diabetes mellitus without complications: Secondary | ICD-10-CM | POA: Diagnosis not present

## 2019-07-06 DIAGNOSIS — M1990 Unspecified osteoarthritis, unspecified site: Secondary | ICD-10-CM | POA: Diagnosis not present

## 2019-07-06 DIAGNOSIS — Z79891 Long term (current) use of opiate analgesic: Secondary | ICD-10-CM | POA: Diagnosis not present

## 2019-07-06 DIAGNOSIS — G894 Chronic pain syndrome: Secondary | ICD-10-CM | POA: Diagnosis not present

## 2019-07-06 DIAGNOSIS — I509 Heart failure, unspecified: Secondary | ICD-10-CM | POA: Diagnosis not present

## 2019-07-06 DIAGNOSIS — I110 Hypertensive heart disease with heart failure: Secondary | ICD-10-CM | POA: Diagnosis not present

## 2019-07-06 DIAGNOSIS — M797 Fibromyalgia: Secondary | ICD-10-CM | POA: Diagnosis not present

## 2019-07-06 DIAGNOSIS — J449 Chronic obstructive pulmonary disease, unspecified: Secondary | ICD-10-CM | POA: Diagnosis not present

## 2019-07-06 DIAGNOSIS — Z87440 Personal history of urinary (tract) infections: Secondary | ICD-10-CM | POA: Diagnosis not present

## 2019-07-07 DIAGNOSIS — S72452K Displaced supracondylar fracture without intracondylar extension of lower end of left femur, subsequent encounter for closed fracture with nonunion: Secondary | ICD-10-CM | POA: Diagnosis not present

## 2019-07-07 DIAGNOSIS — M24562 Contracture, left knee: Secondary | ICD-10-CM | POA: Diagnosis not present

## 2019-07-08 ENCOUNTER — Telehealth: Payer: Self-pay | Admitting: *Deleted

## 2019-07-08 DIAGNOSIS — I110 Hypertensive heart disease with heart failure: Secondary | ICD-10-CM | POA: Diagnosis not present

## 2019-07-08 DIAGNOSIS — Z87442 Personal history of urinary calculi: Secondary | ICD-10-CM | POA: Diagnosis not present

## 2019-07-08 DIAGNOSIS — Z87440 Personal history of urinary (tract) infections: Secondary | ICD-10-CM | POA: Diagnosis not present

## 2019-07-08 DIAGNOSIS — J449 Chronic obstructive pulmonary disease, unspecified: Secondary | ICD-10-CM | POA: Diagnosis not present

## 2019-07-08 DIAGNOSIS — E119 Type 2 diabetes mellitus without complications: Secondary | ICD-10-CM | POA: Diagnosis not present

## 2019-07-08 DIAGNOSIS — Z9181 History of falling: Secondary | ICD-10-CM | POA: Diagnosis not present

## 2019-07-08 DIAGNOSIS — M797 Fibromyalgia: Secondary | ICD-10-CM | POA: Diagnosis not present

## 2019-07-08 DIAGNOSIS — Z981 Arthrodesis status: Secondary | ICD-10-CM | POA: Diagnosis not present

## 2019-07-08 DIAGNOSIS — M1990 Unspecified osteoarthritis, unspecified site: Secondary | ICD-10-CM | POA: Diagnosis not present

## 2019-07-08 DIAGNOSIS — Z79891 Long term (current) use of opiate analgesic: Secondary | ICD-10-CM | POA: Diagnosis not present

## 2019-07-08 DIAGNOSIS — G4700 Insomnia, unspecified: Secondary | ICD-10-CM | POA: Diagnosis not present

## 2019-07-08 DIAGNOSIS — E785 Hyperlipidemia, unspecified: Secondary | ICD-10-CM | POA: Diagnosis not present

## 2019-07-08 DIAGNOSIS — S72402K Unspecified fracture of lower end of left femur, subsequent encounter for closed fracture with nonunion: Secondary | ICD-10-CM | POA: Diagnosis not present

## 2019-07-08 DIAGNOSIS — I509 Heart failure, unspecified: Secondary | ICD-10-CM | POA: Diagnosis not present

## 2019-07-08 DIAGNOSIS — G894 Chronic pain syndrome: Secondary | ICD-10-CM | POA: Diagnosis not present

## 2019-07-08 NOTE — Telephone Encounter (Signed)
Left message for Hinton Dyer with [facility] giving verbal orders for wound care to evaluate and treat per Dr. [name].

## 2019-07-08 NOTE — Telephone Encounter (Signed)
Okay to give verba orders as requested

## 2019-07-08 NOTE — Telephone Encounter (Signed)
Hinton Dyer nurse with [facility] left a voicemail requesting a verbal order for wound care.

## 2019-07-10 DIAGNOSIS — Z87442 Personal history of urinary calculi: Secondary | ICD-10-CM | POA: Diagnosis not present

## 2019-07-10 DIAGNOSIS — I110 Hypertensive heart disease with heart failure: Secondary | ICD-10-CM | POA: Diagnosis not present

## 2019-07-10 DIAGNOSIS — M797 Fibromyalgia: Secondary | ICD-10-CM | POA: Diagnosis not present

## 2019-07-10 DIAGNOSIS — S72402K Unspecified fracture of lower end of left femur, subsequent encounter for closed fracture with nonunion: Secondary | ICD-10-CM | POA: Diagnosis not present

## 2019-07-10 DIAGNOSIS — Z9181 History of falling: Secondary | ICD-10-CM | POA: Diagnosis not present

## 2019-07-10 DIAGNOSIS — M1990 Unspecified osteoarthritis, unspecified site: Secondary | ICD-10-CM | POA: Diagnosis not present

## 2019-07-10 DIAGNOSIS — I509 Heart failure, unspecified: Secondary | ICD-10-CM | POA: Diagnosis not present

## 2019-07-10 DIAGNOSIS — G894 Chronic pain syndrome: Secondary | ICD-10-CM | POA: Diagnosis not present

## 2019-07-10 DIAGNOSIS — J449 Chronic obstructive pulmonary disease, unspecified: Secondary | ICD-10-CM | POA: Diagnosis not present

## 2019-07-10 DIAGNOSIS — E785 Hyperlipidemia, unspecified: Secondary | ICD-10-CM | POA: Diagnosis not present

## 2019-07-10 DIAGNOSIS — G4700 Insomnia, unspecified: Secondary | ICD-10-CM | POA: Diagnosis not present

## 2019-07-10 DIAGNOSIS — Z981 Arthrodesis status: Secondary | ICD-10-CM | POA: Diagnosis not present

## 2019-07-10 DIAGNOSIS — E119 Type 2 diabetes mellitus without complications: Secondary | ICD-10-CM | POA: Diagnosis not present

## 2019-07-10 DIAGNOSIS — Z79891 Long term (current) use of opiate analgesic: Secondary | ICD-10-CM | POA: Diagnosis not present

## 2019-07-10 DIAGNOSIS — Z87440 Personal history of urinary (tract) infections: Secondary | ICD-10-CM | POA: Diagnosis not present

## 2019-07-11 LAB — ACID FAST CULTURE WITH REFLEXED SENSITIVITIES (MYCOBACTERIA): Acid Fast Culture: NEGATIVE

## 2019-07-12 ENCOUNTER — Other Ambulatory Visit: Payer: Self-pay | Admitting: Family Medicine

## 2019-07-14 DIAGNOSIS — E119 Type 2 diabetes mellitus without complications: Secondary | ICD-10-CM | POA: Diagnosis not present

## 2019-07-14 DIAGNOSIS — Z981 Arthrodesis status: Secondary | ICD-10-CM | POA: Diagnosis not present

## 2019-07-14 DIAGNOSIS — G4700 Insomnia, unspecified: Secondary | ICD-10-CM | POA: Diagnosis not present

## 2019-07-14 DIAGNOSIS — M1990 Unspecified osteoarthritis, unspecified site: Secondary | ICD-10-CM | POA: Diagnosis not present

## 2019-07-14 DIAGNOSIS — J449 Chronic obstructive pulmonary disease, unspecified: Secondary | ICD-10-CM | POA: Diagnosis not present

## 2019-07-14 DIAGNOSIS — S72402K Unspecified fracture of lower end of left femur, subsequent encounter for closed fracture with nonunion: Secondary | ICD-10-CM | POA: Diagnosis not present

## 2019-07-14 DIAGNOSIS — Z79891 Long term (current) use of opiate analgesic: Secondary | ICD-10-CM | POA: Diagnosis not present

## 2019-07-14 DIAGNOSIS — Z87440 Personal history of urinary (tract) infections: Secondary | ICD-10-CM | POA: Diagnosis not present

## 2019-07-14 DIAGNOSIS — Z87442 Personal history of urinary calculi: Secondary | ICD-10-CM | POA: Diagnosis not present

## 2019-07-14 DIAGNOSIS — E785 Hyperlipidemia, unspecified: Secondary | ICD-10-CM | POA: Diagnosis not present

## 2019-07-14 DIAGNOSIS — I110 Hypertensive heart disease with heart failure: Secondary | ICD-10-CM | POA: Diagnosis not present

## 2019-07-14 DIAGNOSIS — Z9181 History of falling: Secondary | ICD-10-CM | POA: Diagnosis not present

## 2019-07-14 DIAGNOSIS — G894 Chronic pain syndrome: Secondary | ICD-10-CM | POA: Diagnosis not present

## 2019-07-14 DIAGNOSIS — I509 Heart failure, unspecified: Secondary | ICD-10-CM | POA: Diagnosis not present

## 2019-07-14 DIAGNOSIS — M797 Fibromyalgia: Secondary | ICD-10-CM | POA: Diagnosis not present

## 2019-07-14 NOTE — Telephone Encounter (Signed)
Last office visit 06/30/2019 for DM.  Last refilled 06/12/2019 for #120 with no refills.  No future appointments.

## 2019-07-16 DIAGNOSIS — I110 Hypertensive heart disease with heart failure: Secondary | ICD-10-CM | POA: Diagnosis not present

## 2019-07-16 DIAGNOSIS — Z9181 History of falling: Secondary | ICD-10-CM | POA: Diagnosis not present

## 2019-07-16 DIAGNOSIS — M797 Fibromyalgia: Secondary | ICD-10-CM | POA: Diagnosis not present

## 2019-07-16 DIAGNOSIS — Z87440 Personal history of urinary (tract) infections: Secondary | ICD-10-CM | POA: Diagnosis not present

## 2019-07-16 DIAGNOSIS — M1990 Unspecified osteoarthritis, unspecified site: Secondary | ICD-10-CM | POA: Diagnosis not present

## 2019-07-16 DIAGNOSIS — E119 Type 2 diabetes mellitus without complications: Secondary | ICD-10-CM | POA: Diagnosis not present

## 2019-07-16 DIAGNOSIS — G894 Chronic pain syndrome: Secondary | ICD-10-CM | POA: Diagnosis not present

## 2019-07-16 DIAGNOSIS — I509 Heart failure, unspecified: Secondary | ICD-10-CM | POA: Diagnosis not present

## 2019-07-16 DIAGNOSIS — G4700 Insomnia, unspecified: Secondary | ICD-10-CM | POA: Diagnosis not present

## 2019-07-16 DIAGNOSIS — E785 Hyperlipidemia, unspecified: Secondary | ICD-10-CM | POA: Diagnosis not present

## 2019-07-16 DIAGNOSIS — S72402K Unspecified fracture of lower end of left femur, subsequent encounter for closed fracture with nonunion: Secondary | ICD-10-CM | POA: Diagnosis not present

## 2019-07-16 DIAGNOSIS — J449 Chronic obstructive pulmonary disease, unspecified: Secondary | ICD-10-CM | POA: Diagnosis not present

## 2019-07-16 DIAGNOSIS — Z79891 Long term (current) use of opiate analgesic: Secondary | ICD-10-CM | POA: Diagnosis not present

## 2019-07-16 DIAGNOSIS — Z87442 Personal history of urinary calculi: Secondary | ICD-10-CM | POA: Diagnosis not present

## 2019-07-16 DIAGNOSIS — Z981 Arthrodesis status: Secondary | ICD-10-CM | POA: Diagnosis not present

## 2019-07-20 ENCOUNTER — Other Ambulatory Visit: Payer: Self-pay | Admitting: *Deleted

## 2019-07-20 ENCOUNTER — Encounter: Payer: Self-pay | Admitting: Family Medicine

## 2019-07-20 DIAGNOSIS — L89899 Pressure ulcer of other site, unspecified stage: Secondary | ICD-10-CM | POA: Insufficient documentation

## 2019-07-20 MED ORDER — FUROSEMIDE 20 MG PO TABS: ORAL_TABLET | ORAL | 1 refills | Status: DC

## 2019-07-20 NOTE — Assessment & Plan Note (Signed)
Taking to much pioglitazone. Encouraged same medication regimen daily and we can decrease if needed. Needs to follow CBGs closely. Take pioglitazone once daily.  Take metfomrin 2 times daily.  Hold lantus.  Call with fasting blood sugars  and once a day check 2 hours after a meal.

## 2019-07-20 NOTE — Assessment & Plan Note (Signed)
Referral to [facility] for wound care made prior to appt. Encouraged pt to move frequently, rotate body and continue moving forward with PT.

## 2019-07-20 NOTE — Assessment & Plan Note (Signed)
Well controlled. Continue current medication.  

## 2019-07-21 ENCOUNTER — Other Ambulatory Visit: Payer: Self-pay | Admitting: *Deleted

## 2019-07-21 DIAGNOSIS — M797 Fibromyalgia: Secondary | ICD-10-CM | POA: Diagnosis not present

## 2019-07-21 DIAGNOSIS — I110 Hypertensive heart disease with heart failure: Secondary | ICD-10-CM | POA: Diagnosis not present

## 2019-07-21 DIAGNOSIS — J449 Chronic obstructive pulmonary disease, unspecified: Secondary | ICD-10-CM | POA: Diagnosis not present

## 2019-07-21 DIAGNOSIS — Z87442 Personal history of urinary calculi: Secondary | ICD-10-CM | POA: Diagnosis not present

## 2019-07-21 DIAGNOSIS — Z981 Arthrodesis status: Secondary | ICD-10-CM | POA: Diagnosis not present

## 2019-07-21 DIAGNOSIS — G894 Chronic pain syndrome: Secondary | ICD-10-CM | POA: Diagnosis not present

## 2019-07-21 DIAGNOSIS — Z79891 Long term (current) use of opiate analgesic: Secondary | ICD-10-CM | POA: Diagnosis not present

## 2019-07-21 DIAGNOSIS — E119 Type 2 diabetes mellitus without complications: Secondary | ICD-10-CM | POA: Diagnosis not present

## 2019-07-21 DIAGNOSIS — Z87440 Personal history of urinary (tract) infections: Secondary | ICD-10-CM | POA: Diagnosis not present

## 2019-07-21 DIAGNOSIS — E785 Hyperlipidemia, unspecified: Secondary | ICD-10-CM | POA: Diagnosis not present

## 2019-07-21 DIAGNOSIS — M1990 Unspecified osteoarthritis, unspecified site: Secondary | ICD-10-CM | POA: Diagnosis not present

## 2019-07-21 DIAGNOSIS — Z9181 History of falling: Secondary | ICD-10-CM | POA: Diagnosis not present

## 2019-07-21 DIAGNOSIS — G4700 Insomnia, unspecified: Secondary | ICD-10-CM | POA: Diagnosis not present

## 2019-07-21 DIAGNOSIS — S72402K Unspecified fracture of lower end of left femur, subsequent encounter for closed fracture with nonunion: Secondary | ICD-10-CM | POA: Diagnosis not present

## 2019-07-21 DIAGNOSIS — I509 Heart failure, unspecified: Secondary | ICD-10-CM | POA: Diagnosis not present

## 2019-07-21 MED ORDER — ALPRAZOLAM 0.5 MG PO TABS: ORAL_TABLET | ORAL | 0 refills | Status: DC

## 2019-07-21 NOTE — Telephone Encounter (Signed)
Last office visit 06/30/2019 for DM.  Last refilled 06/19/2019 for #90 with no refills.  No future appointments.

## 2019-07-23 DIAGNOSIS — E785 Hyperlipidemia, unspecified: Secondary | ICD-10-CM | POA: Diagnosis not present

## 2019-07-23 DIAGNOSIS — Z981 Arthrodesis status: Secondary | ICD-10-CM | POA: Diagnosis not present

## 2019-07-23 DIAGNOSIS — G894 Chronic pain syndrome: Secondary | ICD-10-CM | POA: Diagnosis not present

## 2019-07-23 DIAGNOSIS — I509 Heart failure, unspecified: Secondary | ICD-10-CM | POA: Diagnosis not present

## 2019-07-23 DIAGNOSIS — E119 Type 2 diabetes mellitus without complications: Secondary | ICD-10-CM | POA: Diagnosis not present

## 2019-07-23 DIAGNOSIS — M797 Fibromyalgia: Secondary | ICD-10-CM | POA: Diagnosis not present

## 2019-07-23 DIAGNOSIS — G4700 Insomnia, unspecified: Secondary | ICD-10-CM | POA: Diagnosis not present

## 2019-07-23 DIAGNOSIS — M1990 Unspecified osteoarthritis, unspecified site: Secondary | ICD-10-CM | POA: Diagnosis not present

## 2019-07-23 DIAGNOSIS — Z87442 Personal history of urinary calculi: Secondary | ICD-10-CM | POA: Diagnosis not present

## 2019-07-23 DIAGNOSIS — Z79891 Long term (current) use of opiate analgesic: Secondary | ICD-10-CM | POA: Diagnosis not present

## 2019-07-23 DIAGNOSIS — S72402K Unspecified fracture of lower end of left femur, subsequent encounter for closed fracture with nonunion: Secondary | ICD-10-CM | POA: Diagnosis not present

## 2019-07-23 DIAGNOSIS — J449 Chronic obstructive pulmonary disease, unspecified: Secondary | ICD-10-CM | POA: Diagnosis not present

## 2019-07-23 DIAGNOSIS — I110 Hypertensive heart disease with heart failure: Secondary | ICD-10-CM | POA: Diagnosis not present

## 2019-07-23 DIAGNOSIS — Z9181 History of falling: Secondary | ICD-10-CM | POA: Diagnosis not present

## 2019-07-23 DIAGNOSIS — Z87440 Personal history of urinary (tract) infections: Secondary | ICD-10-CM | POA: Diagnosis not present

## 2019-07-24 DIAGNOSIS — Z87440 Personal history of urinary (tract) infections: Secondary | ICD-10-CM

## 2019-07-24 DIAGNOSIS — F419 Anxiety disorder, unspecified: Secondary | ICD-10-CM

## 2019-07-24 DIAGNOSIS — Z79891 Long term (current) use of opiate analgesic: Secondary | ICD-10-CM

## 2019-07-24 DIAGNOSIS — Z8701 Personal history of pneumonia (recurrent): Secondary | ICD-10-CM

## 2019-07-24 DIAGNOSIS — G894 Chronic pain syndrome: Secondary | ICD-10-CM | POA: Diagnosis not present

## 2019-07-24 DIAGNOSIS — Z981 Arthrodesis status: Secondary | ICD-10-CM

## 2019-07-24 DIAGNOSIS — M797 Fibromyalgia: Secondary | ICD-10-CM | POA: Diagnosis not present

## 2019-07-24 DIAGNOSIS — E785 Hyperlipidemia, unspecified: Secondary | ICD-10-CM

## 2019-07-24 DIAGNOSIS — G4700 Insomnia, unspecified: Secondary | ICD-10-CM

## 2019-07-24 DIAGNOSIS — I110 Hypertensive heart disease with heart failure: Secondary | ICD-10-CM

## 2019-07-24 DIAGNOSIS — M1990 Unspecified osteoarthritis, unspecified site: Secondary | ICD-10-CM | POA: Diagnosis not present

## 2019-07-24 DIAGNOSIS — E119 Type 2 diabetes mellitus without complications: Secondary | ICD-10-CM

## 2019-07-24 DIAGNOSIS — Z9181 History of falling: Secondary | ICD-10-CM

## 2019-07-24 DIAGNOSIS — Z87442 Personal history of urinary calculi: Secondary | ICD-10-CM

## 2019-07-24 DIAGNOSIS — S72402K Unspecified fracture of lower end of left femur, subsequent encounter for closed fracture with nonunion: Secondary | ICD-10-CM | POA: Diagnosis not present

## 2019-07-24 DIAGNOSIS — I509 Heart failure, unspecified: Secondary | ICD-10-CM

## 2019-07-24 DIAGNOSIS — F329 Major depressive disorder, single episode, unspecified: Secondary | ICD-10-CM

## 2019-07-24 DIAGNOSIS — J449 Chronic obstructive pulmonary disease, unspecified: Secondary | ICD-10-CM

## 2019-07-27 DIAGNOSIS — Z79891 Long term (current) use of opiate analgesic: Secondary | ICD-10-CM | POA: Diagnosis not present

## 2019-07-27 DIAGNOSIS — Z981 Arthrodesis status: Secondary | ICD-10-CM | POA: Diagnosis not present

## 2019-07-27 DIAGNOSIS — M1990 Unspecified osteoarthritis, unspecified site: Secondary | ICD-10-CM | POA: Diagnosis not present

## 2019-07-27 DIAGNOSIS — E119 Type 2 diabetes mellitus without complications: Secondary | ICD-10-CM | POA: Diagnosis not present

## 2019-07-27 DIAGNOSIS — Z87440 Personal history of urinary (tract) infections: Secondary | ICD-10-CM | POA: Diagnosis not present

## 2019-07-27 DIAGNOSIS — I110 Hypertensive heart disease with heart failure: Secondary | ICD-10-CM | POA: Diagnosis not present

## 2019-07-27 DIAGNOSIS — G894 Chronic pain syndrome: Secondary | ICD-10-CM | POA: Diagnosis not present

## 2019-07-27 DIAGNOSIS — J449 Chronic obstructive pulmonary disease, unspecified: Secondary | ICD-10-CM | POA: Diagnosis not present

## 2019-07-27 DIAGNOSIS — G4700 Insomnia, unspecified: Secondary | ICD-10-CM | POA: Diagnosis not present

## 2019-07-27 DIAGNOSIS — S72492S Other fracture of lower end of left femur, sequela: Secondary | ICD-10-CM | POA: Diagnosis not present

## 2019-07-27 DIAGNOSIS — Z87442 Personal history of urinary calculi: Secondary | ICD-10-CM | POA: Diagnosis not present

## 2019-07-27 DIAGNOSIS — E785 Hyperlipidemia, unspecified: Secondary | ICD-10-CM | POA: Diagnosis not present

## 2019-07-27 DIAGNOSIS — Z9181 History of falling: Secondary | ICD-10-CM | POA: Diagnosis not present

## 2019-07-27 DIAGNOSIS — I509 Heart failure, unspecified: Secondary | ICD-10-CM | POA: Diagnosis not present

## 2019-07-27 DIAGNOSIS — S72142D Displaced intertrochanteric fracture of left femur, subsequent encounter for closed fracture with routine healing: Secondary | ICD-10-CM | POA: Diagnosis not present

## 2019-07-27 DIAGNOSIS — S72402K Unspecified fracture of lower end of left femur, subsequent encounter for closed fracture with nonunion: Secondary | ICD-10-CM | POA: Diagnosis not present

## 2019-07-27 DIAGNOSIS — M797 Fibromyalgia: Secondary | ICD-10-CM | POA: Diagnosis not present

## 2019-07-28 DIAGNOSIS — Z87442 Personal history of urinary calculi: Secondary | ICD-10-CM | POA: Diagnosis not present

## 2019-07-28 DIAGNOSIS — J449 Chronic obstructive pulmonary disease, unspecified: Secondary | ICD-10-CM | POA: Diagnosis not present

## 2019-07-28 DIAGNOSIS — M797 Fibromyalgia: Secondary | ICD-10-CM | POA: Diagnosis not present

## 2019-07-28 DIAGNOSIS — Z87440 Personal history of urinary (tract) infections: Secondary | ICD-10-CM | POA: Diagnosis not present

## 2019-07-28 DIAGNOSIS — M1990 Unspecified osteoarthritis, unspecified site: Secondary | ICD-10-CM | POA: Diagnosis not present

## 2019-07-28 DIAGNOSIS — G4700 Insomnia, unspecified: Secondary | ICD-10-CM | POA: Diagnosis not present

## 2019-07-28 DIAGNOSIS — G894 Chronic pain syndrome: Secondary | ICD-10-CM | POA: Diagnosis not present

## 2019-07-28 DIAGNOSIS — Z79891 Long term (current) use of opiate analgesic: Secondary | ICD-10-CM | POA: Diagnosis not present

## 2019-07-28 DIAGNOSIS — Z9181 History of falling: Secondary | ICD-10-CM | POA: Diagnosis not present

## 2019-07-28 DIAGNOSIS — Z981 Arthrodesis status: Secondary | ICD-10-CM | POA: Diagnosis not present

## 2019-07-28 DIAGNOSIS — I110 Hypertensive heart disease with heart failure: Secondary | ICD-10-CM | POA: Diagnosis not present

## 2019-07-28 DIAGNOSIS — E119 Type 2 diabetes mellitus without complications: Secondary | ICD-10-CM | POA: Diagnosis not present

## 2019-07-28 DIAGNOSIS — I509 Heart failure, unspecified: Secondary | ICD-10-CM | POA: Diagnosis not present

## 2019-07-28 DIAGNOSIS — E785 Hyperlipidemia, unspecified: Secondary | ICD-10-CM | POA: Diagnosis not present

## 2019-07-28 DIAGNOSIS — S72402K Unspecified fracture of lower end of left femur, subsequent encounter for closed fracture with nonunion: Secondary | ICD-10-CM | POA: Diagnosis not present

## 2019-07-29 DIAGNOSIS — G4700 Insomnia, unspecified: Secondary | ICD-10-CM | POA: Diagnosis not present

## 2019-07-29 DIAGNOSIS — Z9181 History of falling: Secondary | ICD-10-CM | POA: Diagnosis not present

## 2019-07-29 DIAGNOSIS — E785 Hyperlipidemia, unspecified: Secondary | ICD-10-CM | POA: Diagnosis not present

## 2019-07-29 DIAGNOSIS — S72402K Unspecified fracture of lower end of left femur, subsequent encounter for closed fracture with nonunion: Secondary | ICD-10-CM | POA: Diagnosis not present

## 2019-07-29 DIAGNOSIS — I110 Hypertensive heart disease with heart failure: Secondary | ICD-10-CM | POA: Diagnosis not present

## 2019-07-29 DIAGNOSIS — G894 Chronic pain syndrome: Secondary | ICD-10-CM | POA: Diagnosis not present

## 2019-07-29 DIAGNOSIS — M1990 Unspecified osteoarthritis, unspecified site: Secondary | ICD-10-CM | POA: Diagnosis not present

## 2019-07-29 DIAGNOSIS — I509 Heart failure, unspecified: Secondary | ICD-10-CM | POA: Diagnosis not present

## 2019-07-29 DIAGNOSIS — Z79891 Long term (current) use of opiate analgesic: Secondary | ICD-10-CM | POA: Diagnosis not present

## 2019-07-29 DIAGNOSIS — M797 Fibromyalgia: Secondary | ICD-10-CM | POA: Diagnosis not present

## 2019-07-29 DIAGNOSIS — Z87442 Personal history of urinary calculi: Secondary | ICD-10-CM | POA: Diagnosis not present

## 2019-07-29 DIAGNOSIS — J449 Chronic obstructive pulmonary disease, unspecified: Secondary | ICD-10-CM | POA: Diagnosis not present

## 2019-07-29 DIAGNOSIS — Z87440 Personal history of urinary (tract) infections: Secondary | ICD-10-CM | POA: Diagnosis not present

## 2019-07-29 DIAGNOSIS — E119 Type 2 diabetes mellitus without complications: Secondary | ICD-10-CM | POA: Diagnosis not present

## 2019-07-29 DIAGNOSIS — Z981 Arthrodesis status: Secondary | ICD-10-CM | POA: Diagnosis not present

## 2019-07-31 ENCOUNTER — Other Ambulatory Visit: Payer: Self-pay

## 2019-07-31 ENCOUNTER — Emergency Department (HOSPITAL_COMMUNITY)
Admission: EM | Admit: 2019-07-31 | Discharge: 2019-07-31 | Disposition: A | Discharge status: Home / Self Care | Payer: Medicare Other | Attending: Emergency Medicine | Admitting: Emergency Medicine

## 2019-07-31 ENCOUNTER — Emergency Department (HOSPITAL_COMMUNITY): Payer: Medicare Other

## 2019-07-31 ENCOUNTER — Encounter (HOSPITAL_COMMUNITY): Payer: Self-pay | Admitting: Emergency Medicine

## 2019-07-31 DIAGNOSIS — J441 Chronic obstructive pulmonary disease with (acute) exacerbation: Secondary | ICD-10-CM | POA: Insufficient documentation

## 2019-07-31 DIAGNOSIS — E785 Hyperlipidemia, unspecified: Secondary | ICD-10-CM | POA: Diagnosis not present

## 2019-07-31 DIAGNOSIS — G894 Chronic pain syndrome: Secondary | ICD-10-CM | POA: Diagnosis not present

## 2019-07-31 DIAGNOSIS — Z743 Need for continuous supervision: Secondary | ICD-10-CM | POA: Diagnosis not present

## 2019-07-31 DIAGNOSIS — I110 Hypertensive heart disease with heart failure: Secondary | ICD-10-CM | POA: Diagnosis not present

## 2019-07-31 DIAGNOSIS — Z9181 History of falling: Secondary | ICD-10-CM | POA: Diagnosis not present

## 2019-07-31 DIAGNOSIS — R0602 Shortness of breath: Secondary | ICD-10-CM | POA: Diagnosis not present

## 2019-07-31 DIAGNOSIS — S72402K Unspecified fracture of lower end of left femur, subsequent encounter for closed fracture with nonunion: Secondary | ICD-10-CM | POA: Diagnosis not present

## 2019-07-31 DIAGNOSIS — E119 Type 2 diabetes mellitus without complications: Secondary | ICD-10-CM | POA: Diagnosis not present

## 2019-07-31 DIAGNOSIS — Z87442 Personal history of urinary calculi: Secondary | ICD-10-CM | POA: Diagnosis not present

## 2019-07-31 DIAGNOSIS — Z20828 Contact with and (suspected) exposure to other viral communicable diseases: Secondary | ICD-10-CM | POA: Insufficient documentation

## 2019-07-31 DIAGNOSIS — Z794 Long term (current) use of insulin: Secondary | ICD-10-CM | POA: Insufficient documentation

## 2019-07-31 DIAGNOSIS — R279 Unspecified lack of coordination: Secondary | ICD-10-CM | POA: Diagnosis not present

## 2019-07-31 DIAGNOSIS — R52 Pain, unspecified: Secondary | ICD-10-CM | POA: Diagnosis not present

## 2019-07-31 DIAGNOSIS — R069 Unspecified abnormalities of breathing: Secondary | ICD-10-CM | POA: Diagnosis not present

## 2019-07-31 DIAGNOSIS — Z79899 Other long term (current) drug therapy: Secondary | ICD-10-CM | POA: Insufficient documentation

## 2019-07-31 DIAGNOSIS — R0902 Hypoxemia: Secondary | ICD-10-CM | POA: Diagnosis not present

## 2019-07-31 DIAGNOSIS — M797 Fibromyalgia: Secondary | ICD-10-CM | POA: Diagnosis not present

## 2019-07-31 DIAGNOSIS — J449 Chronic obstructive pulmonary disease, unspecified: Secondary | ICD-10-CM | POA: Diagnosis not present

## 2019-07-31 DIAGNOSIS — M1990 Unspecified osteoarthritis, unspecified site: Secondary | ICD-10-CM | POA: Diagnosis not present

## 2019-07-31 DIAGNOSIS — I509 Heart failure, unspecified: Secondary | ICD-10-CM | POA: Diagnosis not present

## 2019-07-31 DIAGNOSIS — G4700 Insomnia, unspecified: Secondary | ICD-10-CM | POA: Diagnosis not present

## 2019-07-31 DIAGNOSIS — Z79891 Long term (current) use of opiate analgesic: Secondary | ICD-10-CM | POA: Diagnosis not present

## 2019-07-31 DIAGNOSIS — Z87440 Personal history of urinary (tract) infections: Secondary | ICD-10-CM | POA: Diagnosis not present

## 2019-07-31 DIAGNOSIS — Z87891 Personal history of nicotine dependence: Secondary | ICD-10-CM | POA: Diagnosis not present

## 2019-07-31 DIAGNOSIS — R000 Tachycardia, unspecified: Secondary | ICD-10-CM | POA: Diagnosis not present

## 2019-07-31 DIAGNOSIS — Z981 Arthrodesis status: Secondary | ICD-10-CM | POA: Diagnosis not present

## 2019-07-31 LAB — CBC WITH DIFFERENTIAL/PLATELET
Abs Immature Granulocytes: 0.12 10*3/uL — ABNORMAL HIGH (ref 0.00–0.07)
Basophils Absolute: 0.1 10*3/uL (ref 0.0–0.1)
Basophils Relative: 1 %
Eosinophils Absolute: 0.1 10*3/uL (ref 0.0–0.5)
Eosinophils Relative: 2 %
HCT: 35.2 % — ABNORMAL LOW (ref 36.0–46.0)
Hemoglobin: 10.8 g/dL — ABNORMAL LOW (ref 12.0–15.0)
Immature Granulocytes: 2 %
Lymphocytes Relative: 24 %
Lymphs Abs: 1.8 10*3/uL (ref 0.7–4.0)
MCH: 29.6 pg (ref 26.0–34.0)
MCHC: 30.7 g/dL (ref 30.0–36.0)
MCV: 96.4 fL (ref 80.0–100.0)
Monocytes Absolute: 0.6 10*3/uL (ref 0.1–1.0)
Monocytes Relative: 8 %
Neutro Abs: 5 10*3/uL (ref 1.7–7.7)
Neutrophils Relative %: 63 %
Platelets: 353 10*3/uL (ref 150–400)
RBC: 3.65 MIL/uL — ABNORMAL LOW (ref 3.87–5.11)
RDW: 17.1 % — ABNORMAL HIGH (ref 11.5–15.5)
WBC: 7.8 10*3/uL (ref 4.0–10.5)
nRBC: 0.3 % — ABNORMAL HIGH (ref 0.0–0.2)

## 2019-07-31 LAB — BASIC METABOLIC PANEL
Anion gap: 9 (ref 5–15)
BUN: 16 mg/dL (ref 6–20)
CO2: 27 mmol/L (ref 22–32)
Calcium: 8.3 mg/dL — ABNORMAL LOW (ref 8.9–10.3)
Chloride: 102 mmol/L (ref 98–111)
Creatinine, Ser: 0.73 mg/dL (ref 0.44–1.00)
GFR calc Af Amer: >60 mL/min (ref >60)
GFR calc non Af Amer: >60 mL/min (ref >60)
Glucose, Bld: 181 mg/dL — ABNORMAL HIGH (ref 70–99)
Potassium: 3.6 mmol/L (ref 3.5–5.1)
Sodium: 138 mmol/L (ref 135–145)

## 2019-07-31 LAB — SARS CORONAVIRUS 2 BY RT PCR (HOSPITAL ORDER, PERFORMED IN ~~LOC~~ HOSPITAL LAB): SARS Coronavirus 2: NEGATIVE

## 2019-07-31 LAB — BRAIN NATRIURETIC PEPTIDE: B Natriuretic Peptide: 77.7 pg/mL (ref 0.0–100.0)

## 2019-07-31 LAB — TROPONIN I (HIGH SENSITIVITY): Troponin I (High Sensitivity): 14 ng/L (ref <18)

## 2019-07-31 MED ORDER — ALBUTEROL SULFATE HFA 108 (90 BASE) MCG/ACT IN AERS
8.0000 | INHALATION_SPRAY | Freq: Once | RESPIRATORY_TRACT | Status: AC
  Administered 2019-07-31: 8 via RESPIRATORY_TRACT
  Filled 2019-07-31: qty 6.7

## 2019-07-31 MED ORDER — ALBUTEROL SULFATE HFA 108 (90 BASE) MCG/ACT IN AERS: 2.0000 | INHALATION_SPRAY | RESPIRATORY_TRACT | 0 refills | Status: AC | PRN

## 2019-07-31 MED ORDER — FUROSEMIDE 10 MG/ML IJ SOLN
40.0000 mg | Freq: Once | INTRAMUSCULAR | Status: AC
  Administered 2019-07-31: 06:00:00 40 mg via INTRAVENOUS
  Filled 2019-07-31: qty 4

## 2019-07-31 MED ORDER — PREDNISONE 20 MG PO TABS: 40.0000 mg | ORAL_TABLET | Freq: Every day | ORAL | 0 refills | Status: DC

## 2019-07-31 MED ORDER — ALBUTEROL SULFATE HFA 108 (90 BASE) MCG/ACT IN AERS
8.0000 | INHALATION_SPRAY | Freq: Once | RESPIRATORY_TRACT | Status: AC
  Administered 2019-07-31: 8 via RESPIRATORY_TRACT

## 2019-07-31 NOTE — ED Notes (Signed)
[facility] called to transport patient home 

## 2019-07-31 NOTE — ED Triage Notes (Signed)
Pt BIB [facility] from home c/o worsening SOB. New Productive cough starting today. No O2 at home 88-94 on room air Denies chills/fevers.HX COPD, CHF,  asthma, and former smoker (quit August 2019)  Upon EMS arrival Wheezing. EMS gave Soul Med 125 and 2 g magnesium.   EMS VS BP 140/86 HR 112 RR 24 SpO2 98% NR T 98.2  CBG 169

## 2019-07-31 NOTE — ED Notes (Signed)
[facility] called for pt 

## 2019-07-31 NOTE — Discharge Instructions (Signed)
You were seen today for shortness of breath.  Use your inhaler every 4 hours as needed for shortness of breath.  Take prednisone for the next 5 days.  You likely have a combination of COPD and mild volume overload.  Continue your Lasix.  If you develop fevers, worsening shortness of breath, any new or worsening symptoms you should be reevaluated.

## 2019-07-31 NOTE — ED Notes (Signed)
Pt. 100% on 3 L nasal cannula RR 20s
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
## 2019-07-31 NOTE — ED Notes (Signed)
Pt verbalized understanding of d/c instructions and has no further questions, VSS, NAD. Pt encouraged to take her lasix daily and not miss doses.

## 2019-08-03 ENCOUNTER — Telehealth: Payer: Self-pay

## 2019-08-03 DIAGNOSIS — G894 Chronic pain syndrome: Secondary | ICD-10-CM | POA: Diagnosis not present

## 2019-08-03 DIAGNOSIS — I110 Hypertensive heart disease with heart failure: Secondary | ICD-10-CM | POA: Diagnosis not present

## 2019-08-03 DIAGNOSIS — Z981 Arthrodesis status: Secondary | ICD-10-CM | POA: Diagnosis not present

## 2019-08-03 DIAGNOSIS — S72402K Unspecified fracture of lower end of left femur, subsequent encounter for closed fracture with nonunion: Secondary | ICD-10-CM | POA: Diagnosis not present

## 2019-08-03 DIAGNOSIS — J449 Chronic obstructive pulmonary disease, unspecified: Secondary | ICD-10-CM | POA: Diagnosis not present

## 2019-08-03 DIAGNOSIS — E785 Hyperlipidemia, unspecified: Secondary | ICD-10-CM | POA: Diagnosis not present

## 2019-08-03 DIAGNOSIS — I509 Heart failure, unspecified: Secondary | ICD-10-CM | POA: Diagnosis not present

## 2019-08-03 DIAGNOSIS — Z9181 History of falling: Secondary | ICD-10-CM | POA: Diagnosis not present

## 2019-08-03 DIAGNOSIS — G4700 Insomnia, unspecified: Secondary | ICD-10-CM | POA: Diagnosis not present

## 2019-08-03 DIAGNOSIS — Z87442 Personal history of urinary calculi: Secondary | ICD-10-CM | POA: Diagnosis not present

## 2019-08-03 DIAGNOSIS — Z87440 Personal history of urinary (tract) infections: Secondary | ICD-10-CM | POA: Diagnosis not present

## 2019-08-03 DIAGNOSIS — M1990 Unspecified osteoarthritis, unspecified site: Secondary | ICD-10-CM | POA: Diagnosis not present

## 2019-08-03 DIAGNOSIS — Z79891 Long term (current) use of opiate analgesic: Secondary | ICD-10-CM | POA: Diagnosis not present

## 2019-08-03 DIAGNOSIS — M797 Fibromyalgia: Secondary | ICD-10-CM | POA: Diagnosis not present

## 2019-08-03 DIAGNOSIS — E119 Type 2 diabetes mellitus without complications: Secondary | ICD-10-CM | POA: Diagnosis not present

## 2019-08-03 NOTE — Telephone Encounter (Signed)
Just wanted to flag this for your attention.  Patient discharged from the ER with COPD exacerbation.  They recommend follow up with you.  Patient does have a negative COVID test on record from ER.   Would you like for [place] to set up and in office exam for you to see patient?

## 2019-08-04 NOTE — Telephone Encounter (Signed)
Sandra Ferrell,   Can you get patient set up for an ER follow up PHONE visit with Dr. [name] for 1 week?   Thanks.

## 2019-08-04 NOTE — Telephone Encounter (Signed)
I am pretty sure she cannot get into the office given she is essentially bedridden... we should do a phone visit 1 week out for ER visit.

## 2019-08-05 ENCOUNTER — Telehealth: Payer: Self-pay | Admitting: Family Medicine

## 2019-08-05 DIAGNOSIS — S72142D Displaced intertrochanteric fracture of left femur, subsequent encounter for closed fracture with routine healing: Secondary | ICD-10-CM | POA: Diagnosis not present

## 2019-08-05 DIAGNOSIS — S72492S Other fracture of lower end of left femur, sequela: Secondary | ICD-10-CM | POA: Diagnosis not present

## 2019-08-05 DIAGNOSIS — S72452K Displaced supracondylar fracture without intracondylar extension of lower end of left femur, subsequent encounter for closed fracture with nonunion: Secondary | ICD-10-CM | POA: Diagnosis not present

## 2019-08-05 MED ORDER — BD PEN NEEDLE SHORT U/F 31G X 8 MM MISC: 3 refills | Status: AC

## 2019-08-05 NOTE — Telephone Encounter (Signed)
Refill for pen needles sent to [facility] as requested.

## 2019-08-05 NOTE — Telephone Encounter (Signed)
Appointment 10/2 Spouse aware of appointment

## 2019-08-05 NOTE — Telephone Encounter (Signed)
Best number [phone] Pt needs refill on needles for lantus solostar pens   [facility] [address]

## 2019-08-06 DIAGNOSIS — S72402K Unspecified fracture of lower end of left femur, subsequent encounter for closed fracture with nonunion: Secondary | ICD-10-CM | POA: Diagnosis not present

## 2019-08-06 DIAGNOSIS — Z9181 History of falling: Secondary | ICD-10-CM | POA: Diagnosis not present

## 2019-08-06 DIAGNOSIS — Z79891 Long term (current) use of opiate analgesic: Secondary | ICD-10-CM | POA: Diagnosis not present

## 2019-08-06 DIAGNOSIS — D649 Anemia, unspecified: Secondary | ICD-10-CM | POA: Diagnosis not present

## 2019-08-06 DIAGNOSIS — Z981 Arthrodesis status: Secondary | ICD-10-CM | POA: Diagnosis not present

## 2019-08-06 DIAGNOSIS — M797 Fibromyalgia: Secondary | ICD-10-CM | POA: Diagnosis not present

## 2019-08-06 DIAGNOSIS — E119 Type 2 diabetes mellitus without complications: Secondary | ICD-10-CM | POA: Diagnosis not present

## 2019-08-06 DIAGNOSIS — J449 Chronic obstructive pulmonary disease, unspecified: Secondary | ICD-10-CM | POA: Diagnosis not present

## 2019-08-06 DIAGNOSIS — Z993 Dependence on wheelchair: Secondary | ICD-10-CM | POA: Diagnosis not present

## 2019-08-06 DIAGNOSIS — G894 Chronic pain syndrome: Secondary | ICD-10-CM | POA: Diagnosis not present

## 2019-08-06 DIAGNOSIS — G4700 Insomnia, unspecified: Secondary | ICD-10-CM | POA: Diagnosis not present

## 2019-08-06 DIAGNOSIS — I509 Heart failure, unspecified: Secondary | ICD-10-CM | POA: Diagnosis not present

## 2019-08-06 DIAGNOSIS — M1990 Unspecified osteoarthritis, unspecified site: Secondary | ICD-10-CM | POA: Diagnosis not present

## 2019-08-06 DIAGNOSIS — Z794 Long term (current) use of insulin: Secondary | ICD-10-CM | POA: Diagnosis not present

## 2019-08-06 DIAGNOSIS — Z87440 Personal history of urinary (tract) infections: Secondary | ICD-10-CM | POA: Diagnosis not present

## 2019-08-06 DIAGNOSIS — E785 Hyperlipidemia, unspecified: Secondary | ICD-10-CM | POA: Diagnosis not present

## 2019-08-06 DIAGNOSIS — Z87442 Personal history of urinary calculi: Secondary | ICD-10-CM | POA: Diagnosis not present

## 2019-08-06 DIAGNOSIS — K589 Irritable bowel syndrome without diarrhea: Secondary | ICD-10-CM | POA: Diagnosis not present

## 2019-08-06 DIAGNOSIS — I110 Hypertensive heart disease with heart failure: Secondary | ICD-10-CM | POA: Diagnosis not present

## 2019-08-07 ENCOUNTER — Ambulatory Visit (INDEPENDENT_AMBULATORY_CARE_PROVIDER_SITE_OTHER): Payer: Medicare Other | Admitting: Family Medicine

## 2019-08-07 ENCOUNTER — Encounter: Payer: Self-pay | Admitting: Family Medicine

## 2019-08-07 DIAGNOSIS — R29898 Other symptoms and signs involving the musculoskeletal system: Secondary | ICD-10-CM

## 2019-08-07 DIAGNOSIS — J441 Chronic obstructive pulmonary disease with (acute) exacerbation: Secondary | ICD-10-CM | POA: Diagnosis not present

## 2019-08-07 DIAGNOSIS — I5033 Acute on chronic diastolic (congestive) heart failure: Secondary | ICD-10-CM

## 2019-08-07 DIAGNOSIS — I5030 Unspecified diastolic (congestive) heart failure: Secondary | ICD-10-CM | POA: Insufficient documentation

## 2019-08-07 NOTE — Assessment & Plan Note (Signed)
Improving. She will restart symbicort and use albuterol prn.  Complete prednsione taper.

## 2019-08-07 NOTE — Assessment & Plan Note (Signed)
She is doing much better with mobility and per pt reports PT has stated in 1 week she will be ready to transition to outpt PT.

## 2019-08-07 NOTE — Assessment & Plan Note (Signed)
Fluid status improved but per pt still significant peripheral edema. This wioll improve with ambulation but for now she can increase lasix for 3 days.

## 2019-08-07 NOTE — Progress Notes (Signed)
VIRTUAL VISIT Due to national recommendations of social distancing due to [place] 19, a virtual visit is felt to be most appropriate for this patient at this time.   I connected with the patient on 08/07/19 at  9:20 AM EDT by virtual telehealth platform and verified that I am speaking with the correct person using two identifiers.   I discussed the limitations, risks, security and privacy concerns of performing an evaluation and management service by  virtual telehealth platform and the availability of in person appointments. I also discussed with the patient that there may be a patient responsible charge related to this service. The patient expressed understanding and agreed to proceed.  Patient location: Home Provider Location: [place] Participants: Eliezer Lofts and [name]   Chief Complaint  Patient presents with  . Follow-up    MC-ED COPE Exacerbation    History of Present Illness:  58 year old female presents for follow up ER visit on 07/31/19 for COPD and CHF exacerbation. Per EMS she was given magnesium, supplemental oxygen, and Solu-Medrol.  Upon arrival at home O2 sats were 88 to 94%.  EKG: stable  neg troponin  BNP 77  cbc:  Wbc 7.8, Hg 10.8 Neg COVID19 Treated with lasix, steroids  CXR was suggestive of bronchitis vs edema.  Discharged with prednisone taper and albuterol.  She reports that breathing in back to her baseline.  No fever.  She has peripheral edema still.. she is still using lasix daily which helps.  She is using an albuterol inhaler and spacer once every 4 hours.  She is not taking symbicort.  She is gradually getting more mobile... she will be discharged  To outpatient therapy.  COVID 19 screen No recent travel or known exposure to [place]  The importance of social distancing was discussed today.   Review of Systems  Constitutional: Negative for chills and fever.  HENT: Negative for congestion and ear pain.   Eyes: Negative for  pain and redness.  Respiratory: Positive for wheezing. Negative for cough and shortness of breath.   Cardiovascular: Negative for chest pain, palpitations and leg swelling.  Gastrointestinal: Negative for abdominal pain, blood in stool, constipation, diarrhea, nausea and vomiting.  Genitourinary: Negative for dysuria.  Musculoskeletal: Negative for falls and myalgias.  Skin: Negative for rash.  Neurological: Negative for dizziness.  Psychiatric/Behavioral: Negative for depression. The patient is not nervous/anxious.       Past Medical History:  Diagnosis Date  . Allergic rhinitis, cause unspecified   . Anemia   . Anxiety state, unspecified   . Backache, unspecified   . Carpal tunnel syndrome, right    Bilateral  . CHF (congestive heart failure) ([place])    unspecified , patient denies  . Chronic pain syndrome   . Constipation   . COPD (chronic obstructive pulmonary disease) ([place])   . Cough   . Depressive disorder, not elsewhere classified    managed with medications  . Difficulty in walking   . Displaced intertrochanteric fracture of left femur ([place])   . Esophageal reflux   . Extrinsic asthma with exacerbation   . Fibromyalgia   . Heart murmur    "a small one"  . History of kidney stones   . Hyperlipidemia   . Hypertension   . Muscle weakness (generalized)   . Nicotine dependence, cigarettes, uncomplicated   . Osteoarthrosis, unspecified whether generalized or localized, unspecified site   . Other abnormalities of gait and mobility   . Other and unspecified  hyperlipidemia   . Other irritable bowel syndrome   . Other screening mammogram   . Personal history of unspecified urinary disorder   . Pneumonia   . Pressure ulcer of sacral region   . Routine general medical examination at a health care facility   . Routine gynecological examination   . Tobacco use disorder   . Type II or unspecified type diabetes mellitus without mention of complication, not stated as  uncontrolled    Type II  . Unspecified fall, subsequent encounter   . Unspecified sleep apnea   . Urinary tract infection   . Vaginitis   . Wears glasses   . Wheezing     reports that she quit smoking about 14 months ago. Her smoking use included cigarettes. She has a 35.00 pack-year smoking history. She has never used smokeless tobacco. She reports that she does not drink alcohol or use drugs.   Current Outpatient Medications:  .  ACCU-CHEK SOFTCLIX LANCETS lancets, CHECK BLOOD SUGAR TWICE DAILY AND AS DIRECTED, Disp: 100 each, Rfl: 11 .  acetaminophen (TYLENOL) 650 MG CR tablet, Take 1,300 mg by mouth every 8 (eight) hours as needed for pain., Disp: , Rfl:  .  AIMSCO INSULIN SYR ULTRA THIN 31G X 5/16" 0.3 ML MISC, , Disp: , Rfl:  .  albuterol (VENTOLIN HFA) 108 (90 Base) MCG/ACT inhaler, Inhale 2 puffs into the lungs every 4 (four) hours as needed for wheezing or shortness of breath., Disp: 18 g, Rfl: 0 .  Alcohol Swabs PADS, Check blood sugar twice a day and as directed. Dx E11.9, Disp: 100 each, Rfl: 5 .  ALPRAZolam (XANAX) 0.5 MG tablet, TAKE 1 TABLET(0.5 MG) BY MOUTH THREE TIMES DAILY PRN ANXIETY, Disp: 90 tablet, Rfl: 0 .  amitriptyline (ELAVIL) 75 MG tablet, TAKE 1 TABLET(75 MG) BY MOUTH AT BEDTIME, Disp: 30 tablet, Rfl: 5 .  Blood Glucose Monitoring Suppl (ACCU-CHEK AVIVA PLUS) w/Device KIT, Check blood sugar twice daily and as directed., Disp: 1 kit, Rfl: 0 .  budesonide-formoterol (SYMBICORT) 160-4.5 MCG/ACT inhaler, INHALE 2 PUFFS INTO THE LUNGS TWICE DAILY, Disp: 10.2 g, Rfl: 2 .  chlorpheniramine (CHLOR-TRIMETON) 4 MG tablet, Take 8 mg by mouth 2 (two) times daily as needed for allergies., Disp: , Rfl:  .  enoxaparin (LOVENOX) 40 MG/0.4ML injection, Inject 0.4 mLs (40 mg total) into the skin daily., Disp: 10 mL, Rfl: 0 .  feeding supplement, GLUCERNA SHAKE, (GLUCERNA SHAKE) LIQD, Take 237 mLs by mouth 2 (two) times daily between meals. (Patient taking differently: Take 237 mLs by  mouth daily. ), Disp: 10 Can, Rfl: 0 .  furosemide (LASIX) 20 MG tablet, TAKE 1 TABLET(20 MG) BY MOUTH DAILY, Disp: 90 tablet, Rfl: 1 .  gabapentin (NEURONTIN) 300 MG capsule, TAKE ONE CAPSULE BY MOUTH IN THE MORNING, 1 CAPSULE IN THE AFTERNOON, AND 2 CAPSULES AT BEDTIME, Disp: 120 capsule, Rfl: 0 .  gemfibrozil (LOPID) 600 MG tablet, Take 1 tablet (600 mg total) by mouth 2 (two) times daily before a meal., Disp: 60 tablet, Rfl: 11 .  glucose blood (ACCU-CHEK AVIVA PLUS) test strip, TEST BLOOD SUGAR TWICE DAILY AND DIRECTED, Disp: 100 each, Rfl: 11 .  Insulin Glargine (LANTUS SOLOSTAR) 100 UNIT/ML Solostar Pen, ADMINISTER 35 UNITS UNDER THE SKIN AT BEDTIME (Patient taking differently: Inject 35 Units into the skin at bedtime as needed (if blood sugar is over 250). ), Disp: 15 mL, Rfl: 5 .  Insulin Pen Needle (B-D ULTRAFINE III SHORT PEN) 31G X 8  MM MISC, USE AS DIRECTED WITH INSULIN PEN, Disp: 100 each, Rfl: 3 .  Lancet Devices (ADJUSTABLE LANCING DEVICE) MISC, Check blood sugar twice a day and as directed. Dx E11.9, Disp: 100 each, Rfl: 5 .  Menthol-Methyl Salicylate (SALONPAS PAIN RELIEF PATCH) PTCH, Apply 1 patch topically daily as needed (pain)., Disp: , Rfl:  .  metFORMIN (GLUCOPHAGE) 1000 MG tablet, Take 1,000 mg by mouth 2 (two) times daily with a meal., Disp: , Rfl:  .  methocarbamol (ROBAXIN) 500 MG tablet, Take 2 tablets (1,000 mg total) by mouth 3 times/day as needed-between meals & bedtime for muscle spasms., Disp: 60 tablet, Rfl: 2 .  omeprazole (PRILOSEC OTC) 20 MG tablet, Take 20 mg by mouth daily. , Disp: , Rfl:  .  OVER THE COUNTER MEDICATION, Apply 1 application topically daily as needed (pain). Hemp oil, Disp: , Rfl:  .  oxyCODONE 10 MG TABS, Take 1 tablet (10 mg total) by mouth every 4 (four) hours as needed for severe pain., Disp: 42 tablet, Rfl: 0 .  pioglitazone (ACTOS) 45 MG tablet, TAKE 1 TABLET BY MOUTH EVERY DAY (Patient taking differently: Take 45 mg by mouth daily. ), Disp:  90 tablet, Rfl: 1 .  predniSONE (DELTASONE) 20 MG tablet, Take 2 tablets (40 mg total) by mouth daily., Disp: 10 tablet, Rfl: 0 .  PROAIR HFA 108 (90 Base) MCG/ACT inhaler, TAKE 2 PUFFS BY MOUTH EVERY 4 HOURS AS NEEDED FOR WHEEZE OR FOR SHORTNESS OF BREATH, Disp: 8.5 Inhaler, Rfl: 5 .  Vitamin D, Ergocalciferol, (DRISDOL) 1.25 MG (50000 UT) CAPS capsule, Take 1 capsule (50,000 Units total) by mouth every 7 (seven) days., Disp: 5 capsule, Rfl: 0   Observations/Objective: Height 4' 9.5" (1.461 m).  Physical Exam  Physical Exam Constitutional:      General: The patient is not in acute distress. Pulmonary:     Effort: Pulmonary effort is normal. No respiratory distress.  Neurological:     Mental Status: The patient is alert and oriented to person, place, and time.  Psychiatric:        Mood and Affect: Mood normal.        Behavior: Behavior normal.   Assessment and Plan COPD exacerbation ([place]) Improving. She will restart symbicort and use albuterol prn.  Complete prednsione taper.  Bilateral leg weakness She is doing much better with mobility and per pt reports PT has stated in 1 week she will be ready to transition to outpt PT.  Diastolic heart failure ([facility]) Fluid status improved but per pt still significant peripheral edema. This wioll improve with ambulation but for now she can increase lasix for 3 days.     I discussed the assessment and treatment plan with the patient. The patient was provided an opportunity to ask questions and all were answered. The patient agreed with the plan and demonstrated an understanding of the instructions.   The patient was advised to call back or seek an in-person evaluation if the symptoms worsen or if the condition fails to improve as anticipated.     Eliezer Lofts, MD

## 2019-08-10 DIAGNOSIS — M1990 Unspecified osteoarthritis, unspecified site: Secondary | ICD-10-CM | POA: Diagnosis not present

## 2019-08-10 DIAGNOSIS — Z981 Arthrodesis status: Secondary | ICD-10-CM | POA: Diagnosis not present

## 2019-08-10 DIAGNOSIS — Z993 Dependence on wheelchair: Secondary | ICD-10-CM | POA: Diagnosis not present

## 2019-08-10 DIAGNOSIS — G4700 Insomnia, unspecified: Secondary | ICD-10-CM | POA: Diagnosis not present

## 2019-08-10 DIAGNOSIS — I509 Heart failure, unspecified: Secondary | ICD-10-CM | POA: Diagnosis not present

## 2019-08-10 DIAGNOSIS — E785 Hyperlipidemia, unspecified: Secondary | ICD-10-CM | POA: Diagnosis not present

## 2019-08-10 DIAGNOSIS — Z87442 Personal history of urinary calculi: Secondary | ICD-10-CM | POA: Diagnosis not present

## 2019-08-10 DIAGNOSIS — S72402K Unspecified fracture of lower end of left femur, subsequent encounter for closed fracture with nonunion: Secondary | ICD-10-CM | POA: Diagnosis not present

## 2019-08-10 DIAGNOSIS — Z79891 Long term (current) use of opiate analgesic: Secondary | ICD-10-CM | POA: Diagnosis not present

## 2019-08-10 DIAGNOSIS — Z794 Long term (current) use of insulin: Secondary | ICD-10-CM | POA: Diagnosis not present

## 2019-08-10 DIAGNOSIS — I110 Hypertensive heart disease with heart failure: Secondary | ICD-10-CM | POA: Diagnosis not present

## 2019-08-10 DIAGNOSIS — K589 Irritable bowel syndrome without diarrhea: Secondary | ICD-10-CM | POA: Diagnosis not present

## 2019-08-10 DIAGNOSIS — J449 Chronic obstructive pulmonary disease, unspecified: Secondary | ICD-10-CM | POA: Diagnosis not present

## 2019-08-10 DIAGNOSIS — E119 Type 2 diabetes mellitus without complications: Secondary | ICD-10-CM | POA: Diagnosis not present

## 2019-08-10 DIAGNOSIS — D649 Anemia, unspecified: Secondary | ICD-10-CM | POA: Diagnosis not present

## 2019-08-10 DIAGNOSIS — Z87440 Personal history of urinary (tract) infections: Secondary | ICD-10-CM | POA: Diagnosis not present

## 2019-08-10 DIAGNOSIS — Z9181 History of falling: Secondary | ICD-10-CM | POA: Diagnosis not present

## 2019-08-10 DIAGNOSIS — M797 Fibromyalgia: Secondary | ICD-10-CM | POA: Diagnosis not present

## 2019-08-10 DIAGNOSIS — G894 Chronic pain syndrome: Secondary | ICD-10-CM | POA: Diagnosis not present

## 2019-08-11 DIAGNOSIS — S72452K Displaced supracondylar fracture without intracondylar extension of lower end of left femur, subsequent encounter for closed fracture with nonunion: Secondary | ICD-10-CM | POA: Diagnosis not present

## 2019-08-11 DIAGNOSIS — M24562 Contracture, left knee: Secondary | ICD-10-CM | POA: Diagnosis not present

## 2019-08-12 DIAGNOSIS — D649 Anemia, unspecified: Secondary | ICD-10-CM | POA: Diagnosis not present

## 2019-08-12 DIAGNOSIS — Z794 Long term (current) use of insulin: Secondary | ICD-10-CM | POA: Diagnosis not present

## 2019-08-12 DIAGNOSIS — Z79891 Long term (current) use of opiate analgesic: Secondary | ICD-10-CM | POA: Diagnosis not present

## 2019-08-12 DIAGNOSIS — Z87440 Personal history of urinary (tract) infections: Secondary | ICD-10-CM | POA: Diagnosis not present

## 2019-08-12 DIAGNOSIS — I509 Heart failure, unspecified: Secondary | ICD-10-CM | POA: Diagnosis not present

## 2019-08-12 DIAGNOSIS — E119 Type 2 diabetes mellitus without complications: Secondary | ICD-10-CM | POA: Diagnosis not present

## 2019-08-12 DIAGNOSIS — K589 Irritable bowel syndrome without diarrhea: Secondary | ICD-10-CM | POA: Diagnosis not present

## 2019-08-12 DIAGNOSIS — Z981 Arthrodesis status: Secondary | ICD-10-CM | POA: Diagnosis not present

## 2019-08-12 DIAGNOSIS — Z993 Dependence on wheelchair: Secondary | ICD-10-CM | POA: Diagnosis not present

## 2019-08-12 DIAGNOSIS — I110 Hypertensive heart disease with heart failure: Secondary | ICD-10-CM | POA: Diagnosis not present

## 2019-08-12 DIAGNOSIS — E785 Hyperlipidemia, unspecified: Secondary | ICD-10-CM | POA: Diagnosis not present

## 2019-08-12 DIAGNOSIS — Z9181 History of falling: Secondary | ICD-10-CM | POA: Diagnosis not present

## 2019-08-12 DIAGNOSIS — Z87442 Personal history of urinary calculi: Secondary | ICD-10-CM | POA: Diagnosis not present

## 2019-08-12 DIAGNOSIS — S72402K Unspecified fracture of lower end of left femur, subsequent encounter for closed fracture with nonunion: Secondary | ICD-10-CM | POA: Diagnosis not present

## 2019-08-12 DIAGNOSIS — M797 Fibromyalgia: Secondary | ICD-10-CM | POA: Diagnosis not present

## 2019-08-12 DIAGNOSIS — J449 Chronic obstructive pulmonary disease, unspecified: Secondary | ICD-10-CM | POA: Diagnosis not present

## 2019-08-12 DIAGNOSIS — M1990 Unspecified osteoarthritis, unspecified site: Secondary | ICD-10-CM | POA: Diagnosis not present

## 2019-08-12 DIAGNOSIS — G4700 Insomnia, unspecified: Secondary | ICD-10-CM | POA: Diagnosis not present

## 2019-08-12 DIAGNOSIS — G894 Chronic pain syndrome: Secondary | ICD-10-CM | POA: Diagnosis not present

## 2019-08-13 DIAGNOSIS — M1990 Unspecified osteoarthritis, unspecified site: Secondary | ICD-10-CM | POA: Diagnosis not present

## 2019-08-13 DIAGNOSIS — E119 Type 2 diabetes mellitus without complications: Secondary | ICD-10-CM | POA: Diagnosis not present

## 2019-08-13 DIAGNOSIS — Z993 Dependence on wheelchair: Secondary | ICD-10-CM | POA: Diagnosis not present

## 2019-08-13 DIAGNOSIS — G894 Chronic pain syndrome: Secondary | ICD-10-CM | POA: Diagnosis not present

## 2019-08-13 DIAGNOSIS — G4700 Insomnia, unspecified: Secondary | ICD-10-CM | POA: Diagnosis not present

## 2019-08-13 DIAGNOSIS — Z794 Long term (current) use of insulin: Secondary | ICD-10-CM | POA: Diagnosis not present

## 2019-08-13 DIAGNOSIS — K589 Irritable bowel syndrome without diarrhea: Secondary | ICD-10-CM | POA: Diagnosis not present

## 2019-08-13 DIAGNOSIS — I110 Hypertensive heart disease with heart failure: Secondary | ICD-10-CM | POA: Diagnosis not present

## 2019-08-13 DIAGNOSIS — J449 Chronic obstructive pulmonary disease, unspecified: Secondary | ICD-10-CM | POA: Diagnosis not present

## 2019-08-13 DIAGNOSIS — Z87442 Personal history of urinary calculi: Secondary | ICD-10-CM | POA: Diagnosis not present

## 2019-08-13 DIAGNOSIS — I509 Heart failure, unspecified: Secondary | ICD-10-CM | POA: Diagnosis not present

## 2019-08-13 DIAGNOSIS — Z79891 Long term (current) use of opiate analgesic: Secondary | ICD-10-CM | POA: Diagnosis not present

## 2019-08-13 DIAGNOSIS — S72402K Unspecified fracture of lower end of left femur, subsequent encounter for closed fracture with nonunion: Secondary | ICD-10-CM | POA: Diagnosis not present

## 2019-08-13 DIAGNOSIS — M797 Fibromyalgia: Secondary | ICD-10-CM | POA: Diagnosis not present

## 2019-08-13 DIAGNOSIS — Z9181 History of falling: Secondary | ICD-10-CM | POA: Diagnosis not present

## 2019-08-13 DIAGNOSIS — D649 Anemia, unspecified: Secondary | ICD-10-CM | POA: Diagnosis not present

## 2019-08-13 DIAGNOSIS — E785 Hyperlipidemia, unspecified: Secondary | ICD-10-CM | POA: Diagnosis not present

## 2019-08-13 DIAGNOSIS — Z981 Arthrodesis status: Secondary | ICD-10-CM | POA: Diagnosis not present

## 2019-08-13 DIAGNOSIS — Z87440 Personal history of urinary (tract) infections: Secondary | ICD-10-CM | POA: Diagnosis not present

## 2019-08-18 ENCOUNTER — Telehealth: Payer: Self-pay | Admitting: Family Medicine

## 2019-08-18 NOTE — Telephone Encounter (Signed)
Last office visit 08/07/2019 for ER follow up-COPD exacerbation.  Last refilled Gabapentin 07/14/2019 fir #120 with no refills.  Alprazolam 07/21/2019 for #90 with no refills.  No future appointments.

## 2019-08-18 NOTE — Telephone Encounter (Signed)
Gudiya notified refills were sent to her pharmacy earlier today.

## 2019-08-18 NOTE — Telephone Encounter (Signed)
Pt left vm requesting cb on status of refill for gabapentin and xanax to [facility].

## 2019-08-19 ENCOUNTER — Other Ambulatory Visit: Payer: Self-pay

## 2019-08-19 ENCOUNTER — Ambulatory Visit: Payer: Medicare Other | Attending: Student | Admitting: Physical Therapy

## 2019-08-19 ENCOUNTER — Other Ambulatory Visit: Payer: Self-pay | Admitting: Family Medicine

## 2019-08-19 DIAGNOSIS — R2689 Other abnormalities of gait and mobility: Secondary | ICD-10-CM | POA: Diagnosis not present

## 2019-08-19 DIAGNOSIS — M79605 Pain in left leg: Secondary | ICD-10-CM | POA: Insufficient documentation

## 2019-08-19 DIAGNOSIS — M6281 Muscle weakness (generalized): Secondary | ICD-10-CM | POA: Diagnosis not present

## 2019-08-19 NOTE — Telephone Encounter (Signed)
Last office visit 08/07/2019 for hospital follow up.  Last refilled 06/04/2019 for #60 with 2 refills.  No future appointments.

## 2019-08-21 ENCOUNTER — Telehealth: Payer: Self-pay

## 2019-08-21 ENCOUNTER — Encounter: Payer: Self-pay | Admitting: Physical Therapy

## 2019-08-21 MED ORDER — METFORMIN HCL 1000 MG PO TABS: 1000.0000 mg | ORAL_TABLET | Freq: Two times a day (BID) | ORAL | 1 refills | Status: DC

## 2019-08-21 NOTE — Telephone Encounter (Signed)
Pt left v/m requesting refill metformin 1000 mg to [facility]. Last refilled on hx med list 06/23/18 # 180 x 1 with note to stop taking at discharge. Should pt be taking metformin? 06/30/19 office note does have take metformin 2 times daily. Pt request cb.

## 2019-08-21 NOTE — Telephone Encounter (Signed)
Yes, she is on metformin.Marland Kitchen okay to refill.

## 2019-08-21 NOTE — Addendum Note (Signed)
Addended by: Carter Kitten on: 08/21/2019 03:42 PM   Modules accepted: Orders
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
## 2019-08-21 NOTE — Telephone Encounter (Signed)
Metformin refilled as instructed by Dr. [name].

## 2019-08-26 DIAGNOSIS — S72142D Displaced intertrochanteric fracture of left femur, subsequent encounter for closed fracture with routine healing: Secondary | ICD-10-CM | POA: Diagnosis not present

## 2019-08-26 DIAGNOSIS — S72492S Other fracture of lower end of left femur, sequela: Secondary | ICD-10-CM | POA: Diagnosis not present

## 2019-08-27 ENCOUNTER — Other Ambulatory Visit: Payer: Self-pay | Admitting: Family Medicine

## 2019-08-31 ENCOUNTER — Other Ambulatory Visit: Payer: Self-pay

## 2019-08-31 ENCOUNTER — Ambulatory Visit: Payer: Medicare Other | Admitting: Physical Therapy

## 2019-08-31 ENCOUNTER — Encounter: Payer: Self-pay | Admitting: Physical Therapy

## 2019-08-31 DIAGNOSIS — M6281 Muscle weakness (generalized): Secondary | ICD-10-CM

## 2019-08-31 DIAGNOSIS — R2689 Other abnormalities of gait and mobility: Secondary | ICD-10-CM

## 2019-08-31 DIAGNOSIS — M79605 Pain in left leg: Secondary | ICD-10-CM | POA: Diagnosis not present
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
## 2019-09-02 ENCOUNTER — Ambulatory Visit: Payer: Medicare Other | Admitting: Physical Therapy

## 2019-09-04 DIAGNOSIS — S72142D Displaced intertrochanteric fracture of left femur, subsequent encounter for closed fracture with routine healing: Secondary | ICD-10-CM | POA: Diagnosis not present

## 2019-09-04 DIAGNOSIS — S72492S Other fracture of lower end of left femur, sequela: Secondary | ICD-10-CM | POA: Diagnosis not present

## 2019-09-04 DIAGNOSIS — S72452K Displaced supracondylar fracture without intracondylar extension of lower end of left femur, subsequent encounter for closed fracture with nonunion: Secondary | ICD-10-CM | POA: Diagnosis not present

## 2019-09-07 ENCOUNTER — Encounter: Payer: Self-pay | Admitting: Physical Therapy

## 2019-09-07 ENCOUNTER — Other Ambulatory Visit: Payer: Self-pay

## 2019-09-07 ENCOUNTER — Ambulatory Visit: Payer: Medicare Other | Attending: Student | Admitting: Physical Therapy

## 2019-09-07 DIAGNOSIS — M6281 Muscle weakness (generalized): Secondary | ICD-10-CM

## 2019-09-07 DIAGNOSIS — R2689 Other abnormalities of gait and mobility: Secondary | ICD-10-CM | POA: Insufficient documentation

## 2019-09-07 DIAGNOSIS — M79605 Pain in left leg: Secondary | ICD-10-CM | POA: Diagnosis not present
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
## 2019-09-09 ENCOUNTER — Ambulatory Visit: Payer: Medicare Other | Admitting: Physical Therapy

## 2019-09-09 ENCOUNTER — Other Ambulatory Visit: Payer: Self-pay

## 2019-09-09 DIAGNOSIS — R2689 Other abnormalities of gait and mobility: Secondary | ICD-10-CM | POA: Diagnosis not present

## 2019-09-09 DIAGNOSIS — M6281 Muscle weakness (generalized): Secondary | ICD-10-CM | POA: Diagnosis not present

## 2019-09-09 DIAGNOSIS — M79605 Pain in left leg: Secondary | ICD-10-CM | POA: Diagnosis not present

## 2019-09-10 ENCOUNTER — Encounter: Payer: Self-pay | Admitting: Family Medicine

## 2019-09-10 ENCOUNTER — Encounter: Payer: Self-pay | Admitting: Physical Therapy
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
## 2019-09-14 ENCOUNTER — Other Ambulatory Visit: Payer: Self-pay | Admitting: Family Medicine

## 2019-09-14 ENCOUNTER — Ambulatory Visit: Payer: Medicare Other | Admitting: Physical Therapy

## 2019-09-14 NOTE — Telephone Encounter (Signed)
Last office visit 08/07/2019 [facility] ED follow up COPD exacerbation.  Last refilled 08/18/2019 for #120 with no refills.  No future appointments with PCP.

## 2019-09-15 ENCOUNTER — Other Ambulatory Visit: Payer: Self-pay | Admitting: Family Medicine

## 2019-09-15 NOTE — Telephone Encounter (Signed)
Last office visit 08/07/2019 for hospital follow up.   Last refilled 08/18/2019 for #90 with no refills.  No future appointments with PCP.

## 2019-09-16 ENCOUNTER — Ambulatory Visit: Payer: Medicare Other | Admitting: Physical Therapy

## 2019-09-18 ENCOUNTER — Other Ambulatory Visit: Payer: Self-pay | Admitting: Family Medicine

## 2019-09-21 ENCOUNTER — Ambulatory Visit: Payer: Medicare Other | Admitting: Physical Therapy

## 2019-09-22 ENCOUNTER — Telehealth: Payer: Self-pay

## 2019-09-22 NOTE — Telephone Encounter (Signed)
Spoke to pt. She said she is still in a wheelchair and only going to outpt rehab. She and husband said she cannot get in and they do not have video capabilities. She is asking what she needs to do.

## 2019-09-22 NOTE — Telephone Encounter (Addendum)
Pt left v/m;pt said legs and feet are swollen again; pt has been taking furosemide 20 mg taking 2 tabs daily but is not helping get rid of fluid from her legs and feet. Pt does not sound SOB. Pt wants to know what to take to get fluid out of legs and feet.pt is doing outpt therapy at [facility].pt also sees Dr Lennette Bihari [facility] orth[facility] also.last seen 08/07/19. Pt request cb today. Unable to reach pt by phone for further info.

## 2019-09-22 NOTE — Telephone Encounter (Signed)
I am not sure how to help the patient. I have not seen her in person in over a year since 06/2018. I am not sure how to treat or eval her without a lung heart exam. I need to see her in person for further recommendations.   Tell her I am sorry about the loss of her mother in law..she was a sweet lady.

## 2019-09-22 NOTE — Telephone Encounter (Signed)
Ideally she needs appt in office with me now that she is ambulatory.

## 2019-09-23 ENCOUNTER — Ambulatory Visit: Payer: Medicare Other | Admitting: Physical Therapy

## 2019-09-23 NOTE — Telephone Encounter (Signed)
Mrs. Harnack notified as instructed by telephone.  Appointment scheduled for 09/24/2019 at 12 pm with Dr. [name].  Patient states she has increased her Lasix to 2 tablets daily for the last 3 days.  She will cancel her rehab appointment scheduled for today since her legs are so swollen and will wait to see what Dr. [name] recommends tomorrow.

## 2019-09-24 ENCOUNTER — Other Ambulatory Visit: Payer: Self-pay

## 2019-09-24 ENCOUNTER — Encounter: Payer: Self-pay | Admitting: Family Medicine

## 2019-09-24 ENCOUNTER — Ambulatory Visit (INDEPENDENT_AMBULATORY_CARE_PROVIDER_SITE_OTHER): Payer: Medicare Other | Admitting: Family Medicine

## 2019-09-24 VITALS — BP 134/64 | HR 94 | Temp 97.9°F | Ht <= 58 in

## 2019-09-24 DIAGNOSIS — R609 Edema, unspecified: Secondary | ICD-10-CM | POA: Diagnosis not present

## 2019-09-24 LAB — HEMOGLOBIN A1C: Hgb A1c MFr Bld: 6.5 % (ref 4.6–6.5)

## 2019-09-24 LAB — COMPREHENSIVE METABOLIC PANEL
ALT: 10 U/L (ref 0–35)
AST: 14 U/L (ref 0–37)
Albumin: 3.4 g/dL — ABNORMAL LOW (ref 3.5–5.2)
Alkaline Phosphatase: 82 U/L (ref 39–117)
BUN: 21 mg/dL (ref 6–23)
CO2: 32 mEq/L (ref 19–32)
Calcium: 8.9 mg/dL (ref 8.4–10.5)
Chloride: 100 mEq/L (ref 96–112)
Creatinine, Ser: 0.68 mg/dL (ref 0.40–1.20)
GFR: 88.45 mL/min (ref >60.00)
Glucose, Bld: 208 mg/dL — ABNORMAL HIGH (ref 70–99)
Potassium: 3.9 mEq/L (ref 3.5–5.1)
Sodium: 140 mEq/L (ref 135–145)
Total Bilirubin: 0.3 mg/dL (ref 0.2–1.2)
Total Protein: 6.5 g/dL (ref 6.0–8.3)

## 2019-09-24 LAB — HM DIABETES FOOT EXAM

## 2019-09-24 LAB — TSH: TSH: 2.13 u[IU]/mL (ref 0.35–4.50)

## 2019-09-24 NOTE — Progress Notes (Signed)
Chief Complaint  Patient presents with  . Leg Swelling    C/o bilateral leg swelling.  Started about 2 mo ago.  Pt accompanied by husband, James (temp,     History of Present Illness: HPI  58 year old female with history of  Femur fracture with complication and protracted recovery, was bed bound, no ambulatory in outpatient PT presents with worsening peripheral edema bilaterally, onogoing x 2 months. Despite lasix 20 mg daily and elevation.  Now in last 4-5 days.. taking 40 mg daily... causes her to urinate. No new meds. Swelling never gone.  Breathing at baseline.  Not doing daily weights.  BNP, cbc nml in 07/2019  No new meds. No longer needing oxycodone.. using tylenol 2.  She has quit smoking!   Sugar is much better  COVID 19 screen No recent travel or known exposure to COVID19 The patient denies respiratory symptoms of COVID 19 at this time.  The importance of social distancing was discussed today.   Review of Systems  Constitutional: Negative for chills and fever.  HENT: Negative for congestion and ear pain.   Eyes: Negative for pain and redness.  Respiratory: Negative for cough and shortness of breath.   Cardiovascular: Positive for leg swelling. Negative for chest pain, palpitations, orthopnea and PND.  Gastrointestinal: Negative for abdominal pain, blood in stool, constipation, diarrhea, nausea and vomiting.  Genitourinary: Negative for dysuria.  Musculoskeletal: Negative for falls and myalgias.  Skin: Negative for rash.  Neurological: Negative for dizziness.  Psychiatric/Behavioral: Negative for depression. The patient is not nervous/anxious.       Past Medical History:  Diagnosis Date  . Allergic rhinitis, cause unspecified   . Anemia   . Anxiety state, unspecified   . Backache, unspecified   . Carpal tunnel syndrome, right    Bilateral  . CHF (congestive heart failure) ([place])    unspecified , patient denies  . Chronic pain syndrome   . Constipation    . COPD (chronic obstructive pulmonary disease) ([place])   . Cough   . Depressive disorder, not elsewhere classified    managed with medications  . Difficulty in walking   . Displaced intertrochanteric fracture of left femur ([place])   . Esophageal reflux   . Extrinsic asthma with exacerbation   . Fibromyalgia   . Heart murmur    "a small one"  . History of kidney stones   . Hyperlipidemia   . Hypertension   . Muscle weakness (generalized)   . Nicotine dependence, cigarettes, uncomplicated   . Osteoarthrosis, unspecified whether generalized or localized, unspecified site   . Other abnormalities of gait and mobility   . Other and unspecified hyperlipidemia   . Other irritable bowel syndrome   . Other screening mammogram   . Personal history of unspecified urinary disorder   . Pneumonia   . Pressure ulcer of sacral region   . Routine general medical examination at a health care facility   . Routine gynecological examination   . Tobacco use disorder   . Type II or unspecified type diabetes mellitus without mention of complication, not stated as uncontrolled    Type II  . Unspecified fall, subsequent encounter   . Unspecified sleep apnea   . Urinary tract infection   . Vaginitis   . Wears glasses   . Wheezing     reports that she quit smoking about 15 months ago. Her smoking use included cigarettes. She has a 35.00 pack-year smoking history. She has never used smokeless  tobacco. She reports that she does not drink alcohol or use drugs.   Current Outpatient Medications:  .  ACCU-CHEK SOFTCLIX LANCETS lancets, CHECK BLOOD SUGAR TWICE DAILY AND AS DIRECTED, Disp: 100 each, Rfl: 11 .  acetaminophen (TYLENOL) 650 MG CR tablet, Take 1,300 mg by mouth every 8 (eight) hours as needed for pain., Disp: , Rfl:  .  acetaminophen-codeine (TYLENOL #2) 300-15 MG tablet, Take 1 tablet by mouth every 6 (six) hours as needed., Disp: , Rfl:  .  AIMSCO INSULIN SYR ULTRA THIN 31G X 5/16" 0.3 ML MISC, ,  Disp: , Rfl:  .  albuterol (VENTOLIN HFA) 108 (90 Base) MCG/ACT inhaler, Inhale 2 puffs into the lungs every 4 (four) hours as needed for wheezing or shortness of breath., Disp: 18 g, Rfl: 0 .  Alcohol Swabs PADS, Check blood sugar twice a day and as directed. Dx E11.9, Disp: 100 each, Rfl: 5 .  ALPRAZolam (XANAX) 0.5 MG tablet, TAKE 1 TABLET(0.5 MG) BY MOUTH THREE TIMES DAILY AS NEEDED FOR ANXIETY, Disp: 90 tablet, Rfl: 0 .  amitriptyline (ELAVIL) 75 MG tablet, TAKE 1 TABLET(75 MG) BY MOUTH AT BEDTIME, Disp: 30 tablet, Rfl: 5 .  Blood Glucose Monitoring Suppl (ACCU-CHEK AVIVA PLUS) w/Device KIT, Check blood sugar twice daily and as directed., Disp: 1 kit, Rfl: 0 .  budesonide-formoterol (SYMBICORT) 160-4.5 MCG/ACT inhaler, INHALE 2 PUFFS INTO THE LUNGS TWICE DAILY, Disp: 10.2 g, Rfl: 5 .  chlorpheniramine (CHLOR-TRIMETON) 4 MG tablet, Take 8 mg by mouth 2 (two) times daily as needed for allergies., Disp: , Rfl:  .  enoxaparin (LOVENOX) 40 MG/0.4ML injection, Inject 0.4 mLs (40 mg total) into the skin daily., Disp: 10 mL, Rfl: 0 .  feeding supplement, GLUCERNA SHAKE, (GLUCERNA SHAKE) LIQD, Take 237 mLs by mouth 2 (two) times daily between meals. (Patient taking differently: Take 237 mLs by mouth daily. ), Disp: 10 Can, Rfl: 0 .  furosemide (LASIX) 20 MG tablet, TAKE 1 TABLET(20 MG) BY MOUTH DAILY, Disp: 90 tablet, Rfl: 1 .  gabapentin (NEURONTIN) 300 MG capsule, TAKE ONE CAPSULE BY MOUTH IN THE MORNING, 1 CAPSULE IN THE AFTERNOON, AND 2 CAPSULES AT BEDTIME, Disp: 120 capsule, Rfl: 0 .  gemfibrozil (LOPID) 600 MG tablet, Take 1 tablet (600 mg total) by mouth 2 (two) times daily before a meal., Disp: 60 tablet, Rfl: 11 .  glucose blood (ACCU-CHEK AVIVA PLUS) test strip, TEST BLOOD SUGAR TWICE DAILY AND DIRECTED, Disp: 100 each, Rfl: 11 .  Insulin Glargine (LANTUS SOLOSTAR) 100 UNIT/ML Solostar Pen, ADMINISTER 35 UNITS UNDER THE SKIN AT BEDTIME (Patient taking differently: Inject 35 Units into the skin at  bedtime as needed (if blood sugar is over 250). ), Disp: 15 mL, Rfl: 5 .  Insulin Pen Needle (B-D ULTRAFINE III SHORT PEN) 31G X 8 MM MISC, USE AS DIRECTED WITH INSULIN PEN, Disp: 100 each, Rfl: 3 .  Lancet Devices (ADJUSTABLE LANCING DEVICE) MISC, Check blood sugar twice a day and as directed. Dx E11.9, Disp: 100 each, Rfl: 5 .  Menthol-Methyl Salicylate (SALONPAS PAIN RELIEF PATCH) PTCH, Apply 1 patch topically daily as needed (pain)., Disp: , Rfl:  .  metFORMIN (GLUCOPHAGE) 1000 MG tablet, Take 1 tablet (1,000 mg total) by mouth 2 (two) times daily with a meal., Disp: 180 tablet, Rfl: 1 .  methocarbamol (ROBAXIN) 500 MG tablet, TAKE 2 TABLETS( 1000 MG) BY MOUTH THREE TIMES DAILY AS NEEDED FOR MUSCLE SPASMS, BETWEEN MEALS AND AT BEDTIME, Disp: 60 tablet, Rfl: 2 .  omeprazole (PRILOSEC OTC) 20 MG tablet, Take 20 mg by mouth daily. , Disp: , Rfl:  .  OVER THE COUNTER MEDICATION, Apply 1 application topically daily as needed (pain). Hemp oil, Disp: , Rfl:  .  pioglitazone (ACTOS) 45 MG tablet, TAKE 1 TABLET BY MOUTH EVERY DAY, Disp: 90 tablet, Rfl: 1 .  predniSONE (DELTASONE) 20 MG tablet, Take 2 tablets (40 mg total) by mouth daily., Disp: 10 tablet, Rfl: 0 .  PROAIR HFA 108 (90 Base) MCG/ACT inhaler, TAKE 2 PUFFS BY MOUTH EVERY 4 HOURS AS NEEDED FOR WHEEZE OR FOR SHORTNESS OF BREATH, Disp: 8.5 Inhaler, Rfl: 5 .  Vitamin D, Ergocalciferol, (DRISDOL) 1.25 MG (50000 UT) CAPS capsule, Take 1 capsule (50,000 Units total) by mouth every 7 (seven) days., Disp: 5 capsule, Rfl: 0   Observations/Objective: Blood pressure 134/64, pulse 94, temperature 97.9 F (36.6 C), temperature source Temporal, height 4' 9.5" (1.461 m), SpO2 95 %.  Physical Exam Constitutional:      General: She is not in acute distress.    Appearance: Normal appearance. She is well-developed. She is obese. She is not ill-appearing or toxic-appearing.     Comments: In wheelchair  HENT:     Head: Normocephalic.     Right Ear:  Hearing, tympanic membrane, ear canal and external ear normal. Tympanic membrane is not erythematous, retracted or bulging.     Left Ear: Hearing, tympanic membrane, ear canal and external ear normal. Tympanic membrane is not erythematous, retracted or bulging.     Nose: No mucosal edema or rhinorrhea.     Right Sinus: No maxillary sinus tenderness or frontal sinus tenderness.     Left Sinus: No maxillary sinus tenderness or frontal sinus tenderness.     Mouth/Throat:     Pharynx: Uvula midline.  Eyes:     General: Lids are normal. Lids are everted, no foreign bodies appreciated.     Conjunctiva/sclera: Conjunctivae normal.     Pupils: Pupils are equal, round, and reactive to light.  Neck:     Musculoskeletal: Normal range of motion and neck supple.     Thyroid: No thyroid mass or thyromegaly.     Vascular: No carotid bruit.     Trachea: Trachea normal.  Cardiovascular:     Rate and Rhythm: Normal rate and regular rhythm.     Pulses: Normal pulses.     Heart sounds: Normal heart sounds, S1 normal and S2 normal. No murmur. No friction rub. No gallop.   Pulmonary:     Effort: Pulmonary effort is normal. No tachypnea or respiratory distress.     Breath sounds: Normal breath sounds. No decreased breath sounds, wheezing, rhonchi or rales.  Chest:     Comments: Bilateral 2 plus pitting edema Abdominal:     General: Bowel sounds are normal.     Palpations: Abdomen is soft.     Tenderness: There is no abdominal tenderness.  Skin:    General: Skin is warm and dry.     Findings: No rash.  Neurological:     Mental Status: She is alert.  Psychiatric:        Mood and Affect: Mood is not anxious or depressed.        Speech: Speech normal.        Behavior: Behavior normal. Behavior is cooperative.        Thought Content: Thought content normal.        Judgment: Judgment normal.      Diabetic foot exam: Normal  inspection No skin breakdown No calluses  Normal DP pulses Normal sensation  to light touch and monofilament Nails normal  Assessment and Plan    Peripheral edema    Most likely due to inactivity, obesity, venous insufficiency.. eval for secondary causes.  No clear sign of heart failure, nml BNP. HOLD Actos for 1-2 week.. could contribute to swelling.  Decrease salt in diet.  Elevated legs above heart.  Wear compression hose.  Continue lasix 40 mg daily.     Eliezer Lofts, MD

## 2019-09-24 NOTE — Assessment & Plan Note (Signed)
    Most likely due to inactivity, obesity, venous insufficiency.. eval for secondary causes.  No clear sign of heart failure, nml BNP. HOLD Actos for 1-2 week.. could contribute to swelling.  Decrease salt in diet.  Elevated legs above heart.  Wear compression hose.  Continue lasix 40 mg daily.

## 2019-09-24 NOTE — Patient Instructions (Addendum)
HOLD Actos for 1-2 week.. could contribute to swelling.  Decrease salt in diet.  Please stop at the lab to have labs drawn.  Elevated legs above heart.  Wear compression hose.  Continue lasix 40 mg daily.

## 2019-09-25 DIAGNOSIS — Z8701 Personal history of pneumonia (recurrent): Secondary | ICD-10-CM

## 2019-09-25 DIAGNOSIS — M1990 Unspecified osteoarthritis, unspecified site: Secondary | ICD-10-CM | POA: Diagnosis not present

## 2019-09-25 DIAGNOSIS — F419 Anxiety disorder, unspecified: Secondary | ICD-10-CM

## 2019-09-25 DIAGNOSIS — D649 Anemia, unspecified: Secondary | ICD-10-CM

## 2019-09-25 DIAGNOSIS — I110 Hypertensive heart disease with heart failure: Secondary | ICD-10-CM

## 2019-09-25 DIAGNOSIS — G4700 Insomnia, unspecified: Secondary | ICD-10-CM

## 2019-09-25 DIAGNOSIS — Z993 Dependence on wheelchair: Secondary | ICD-10-CM

## 2019-09-25 DIAGNOSIS — Z9181 History of falling: Secondary | ICD-10-CM

## 2019-09-25 DIAGNOSIS — S72402K Unspecified fracture of lower end of left femur, subsequent encounter for closed fracture with nonunion: Secondary | ICD-10-CM | POA: Diagnosis not present

## 2019-09-25 DIAGNOSIS — I509 Heart failure, unspecified: Secondary | ICD-10-CM

## 2019-09-25 DIAGNOSIS — Z87440 Personal history of urinary (tract) infections: Secondary | ICD-10-CM

## 2019-09-25 DIAGNOSIS — K589 Irritable bowel syndrome without diarrhea: Secondary | ICD-10-CM

## 2019-09-25 DIAGNOSIS — J449 Chronic obstructive pulmonary disease, unspecified: Secondary | ICD-10-CM

## 2019-09-25 DIAGNOSIS — M797 Fibromyalgia: Secondary | ICD-10-CM | POA: Diagnosis not present

## 2019-09-25 DIAGNOSIS — E785 Hyperlipidemia, unspecified: Secondary | ICD-10-CM

## 2019-09-25 DIAGNOSIS — F329 Major depressive disorder, single episode, unspecified: Secondary | ICD-10-CM

## 2019-09-25 DIAGNOSIS — Z794 Long term (current) use of insulin: Secondary | ICD-10-CM

## 2019-09-25 DIAGNOSIS — G894 Chronic pain syndrome: Secondary | ICD-10-CM | POA: Diagnosis not present

## 2019-09-25 DIAGNOSIS — Z79891 Long term (current) use of opiate analgesic: Secondary | ICD-10-CM

## 2019-09-25 DIAGNOSIS — Z87442 Personal history of urinary calculi: Secondary | ICD-10-CM

## 2019-09-25 DIAGNOSIS — R32 Unspecified urinary incontinence: Secondary | ICD-10-CM

## 2019-09-25 DIAGNOSIS — Z981 Arthrodesis status: Secondary | ICD-10-CM

## 2019-09-25 DIAGNOSIS — E119 Type 2 diabetes mellitus without complications: Secondary | ICD-10-CM

## 2019-09-26 DIAGNOSIS — S72492S Other fracture of lower end of left femur, sequela: Secondary | ICD-10-CM | POA: Diagnosis not present

## 2019-09-26 DIAGNOSIS — S72142D Displaced intertrochanteric fracture of left femur, subsequent encounter for closed fracture with routine healing: Secondary | ICD-10-CM | POA: Diagnosis not present

## 2019-09-29 DIAGNOSIS — M24562 Contracture, left knee: Secondary | ICD-10-CM | POA: Diagnosis not present

## 2019-09-29 DIAGNOSIS — S72452K Displaced supracondylar fracture without intracondylar extension of lower end of left femur, subsequent encounter for closed fracture with nonunion: Secondary | ICD-10-CM | POA: Diagnosis not present

## 2019-09-30 ENCOUNTER — Telehealth: Payer: Self-pay

## 2019-09-30 NOTE — Telephone Encounter (Signed)
Pt called stating she was talking to her pharmacist and they suggested she start a statin drug since she is a diabetic.   I advised her Dr [name] was out of the office until next week.  Please advise.

## 2019-10-05 DIAGNOSIS — S72492S Other fracture of lower end of left femur, sequela: Secondary | ICD-10-CM | POA: Diagnosis not present

## 2019-10-05 DIAGNOSIS — S72452K Displaced supracondylar fracture without intracondylar extension of lower end of left femur, subsequent encounter for closed fracture with nonunion: Secondary | ICD-10-CM | POA: Diagnosis not present

## 2019-10-05 DIAGNOSIS — S72142D Displaced intertrochanteric fracture of left femur, subsequent encounter for closed fracture with routine healing: Secondary | ICD-10-CM | POA: Diagnosis not present

## 2019-10-06 MED ORDER — ATORVASTATIN CALCIUM 10 MG PO TABS: 10.0000 mg | ORAL_TABLET | Freq: Every day | ORAL | 3 refills | Status: DC

## 2019-10-06 NOTE — Telephone Encounter (Signed)
Pt calling to ck on status of starting a statin drug. Pt notified as instructed and pt voiced understanding. Butch Penny CMA has already sent electronically to [facility]. Pt voiced understanding and will wait to hear from pharmacy that med is ready for pick up. Pt will let Dr [name] know if has any problems.

## 2019-10-06 NOTE — Telephone Encounter (Signed)
Agree. She was on one in past.. but had SE... will retry low dose.  Send in atorvastatin 10 mg daily #30, 11RF

## 2019-10-08 ENCOUNTER — Ambulatory Visit: Payer: Medicare Other | Attending: Student | Admitting: Physical Therapy

## 2019-10-08 ENCOUNTER — Other Ambulatory Visit: Payer: Self-pay

## 2019-10-08 DIAGNOSIS — M6281 Muscle weakness (generalized): Secondary | ICD-10-CM | POA: Insufficient documentation

## 2019-10-08 DIAGNOSIS — R2689 Other abnormalities of gait and mobility: Secondary | ICD-10-CM | POA: Diagnosis not present

## 2019-10-08 DIAGNOSIS — M79605 Pain in left leg: Secondary | ICD-10-CM | POA: Insufficient documentation

## 2019-10-09 ENCOUNTER — Encounter: Payer: Self-pay | Admitting: Physical Therapy
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
## 2019-10-13 ENCOUNTER — Ambulatory Visit: Payer: Medicare Other | Admitting: Physical Therapy

## 2019-10-14 ENCOUNTER — Other Ambulatory Visit: Payer: Self-pay | Admitting: Family Medicine

## 2019-10-14 NOTE — Telephone Encounter (Signed)
Last office visit 09/24/2019 for peripheral edema.  Last refilled 09/15/2019 for #120 with no refills.  No future appointments with PCP.

## 2019-10-15 ENCOUNTER — Encounter: Payer: Medicare Other | Admitting: Physical Therapy

## 2019-10-21 ENCOUNTER — Other Ambulatory Visit: Payer: Self-pay | Admitting: Family Medicine

## 2019-10-21 ENCOUNTER — Ambulatory Visit: Payer: Medicare Other | Admitting: Physical Therapy

## 2019-10-21 NOTE — Telephone Encounter (Signed)
Last office visit 09/24/2019 for peripheral edema.  Last refilled 09/15/2019 for #90 with no refills.  No future appointments with PCP.

## 2019-10-25 ENCOUNTER — Other Ambulatory Visit: Payer: Self-pay | Admitting: Family Medicine

## 2019-10-26 NOTE — Telephone Encounter (Signed)
Last office visit 09/24/2019 for peripheral edema.  Last refilled 08/19/2019 for #60 with 2 refills.  No future appointments with PCP.

## 2019-10-28 ENCOUNTER — Ambulatory Visit: Payer: Medicare Other | Admitting: Physical Therapy

## 2019-11-02 ENCOUNTER — Ambulatory Visit: Payer: Medicare Other | Admitting: Physical Therapy

## 2019-11-04 ENCOUNTER — Ambulatory Visit: Payer: Medicare Other | Admitting: Physical Therapy

## 2019-11-04 DIAGNOSIS — S72142D Displaced intertrochanteric fracture of left femur, subsequent encounter for closed fracture with routine healing: Secondary | ICD-10-CM | POA: Diagnosis not present

## 2019-11-04 DIAGNOSIS — S72452K Displaced supracondylar fracture without intracondylar extension of lower end of left femur, subsequent encounter for closed fracture with nonunion: Secondary | ICD-10-CM | POA: Diagnosis not present

## 2019-11-04 DIAGNOSIS — S72492S Other fracture of lower end of left femur, sequela: Secondary | ICD-10-CM | POA: Diagnosis not present

## 2019-11-09 ENCOUNTER — Telehealth: Payer: Self-pay | Admitting: Physical Therapy

## 2019-11-09 ENCOUNTER — Ambulatory Visit: Payer: Medicare Other | Admitting: Physical Therapy

## 2019-11-09 NOTE — Telephone Encounter (Signed)
Received report from last week that husband was upset that we had to cancel recent PT appointments. Spoke with patient today and apologized. She reported understanding. I offered tomorrow or Friday on [place] as make-up appointments. She declined at this time and plans to keep Wednesday. I asked her to relay this to her husband for me and she agreed.   Romualdo Bolk, PT, DPT 11/09/19 1:47 PM Phone: [phone] Fax: [phone]

## 2019-11-11 ENCOUNTER — Ambulatory Visit: Payer: Medicare Other | Admitting: Physical Therapy

## 2019-11-13 ENCOUNTER — Other Ambulatory Visit: Payer: Self-pay | Admitting: Family Medicine

## 2019-11-13 ENCOUNTER — Ambulatory Visit (INDEPENDENT_AMBULATORY_CARE_PROVIDER_SITE_OTHER): Payer: Medicare Other | Admitting: Family Medicine

## 2019-11-13 ENCOUNTER — Telehealth: Payer: Self-pay

## 2019-11-13 ENCOUNTER — Encounter: Payer: Self-pay | Admitting: Family Medicine

## 2019-11-13 VITALS — Ht <= 58 in

## 2019-11-13 DIAGNOSIS — R300 Dysuria: Secondary | ICD-10-CM | POA: Diagnosis not present

## 2019-11-13 LAB — POC URINALSYSI DIPSTICK (AUTOMATED)
Bilirubin, UA: NEGATIVE
Glucose, UA: NEGATIVE
Ketones, UA: NEGATIVE
Nitrite, UA: NEGATIVE
Protein, UA: POSITIVE — AB
Spec Grav, UA: 1.015 (ref 1.010–1.025)
Urobilinogen, UA: 0.2 E.U./dL
pH, UA: 6 (ref 5.0–8.0)

## 2019-11-13 MED ORDER — CEPHALEXIN 500 MG PO CAPS: 500.0000 mg | ORAL_CAPSULE | Freq: Three times a day (TID) | ORAL | 0 refills | Status: DC

## 2019-11-13 NOTE — Progress Notes (Signed)
VIRTUAL VISIT Due to national recommendations of social distancing due to [place] 19, a virtual visit is felt to be most appropriate for this patient at this time.   I connected with the patient on 11/13/19 at  3:20 PM EST by virtual telehealth platform and verified that I am speaking with the correct person using two identifiers.   I discussed the limitations, risks, security and privacy concerns of performing an evaluation and management service by  virtual telehealth platform and the availability of in person appointments. I also discussed with the patient that there may be a patient responsible charge related to this service. The patient expressed understanding and agreed to proceed.  Patient location: Home Provider Location: [place] Participants: Eliezer Lofts and [name]   Chief Complaint  Patient presents with  . Abdominal Pain  . Back Pain  . Dysuria  . Nausea  . Sneezing    History of Present Illness: Abdominal Pain This is a new problem. The current episode started today. Associated symptoms include dysuria, frequency and nausea. Pertinent negatives include no hematuria or vomiting.  Back Pain This is a new problem. The current episode started in the past 7 days ( 2 days ago). Associated symptoms include abdominal pain and dysuria.  Dysuria  This is a new problem. The current episode started today. The problem has been unchanged. The quality of the pain is described as burning. The pain is moderate. There has been no fever. She is not sexually active. There is no history of pyelonephritis. Associated symptoms include frequency and nausea. Pertinent negatives include no chills, flank pain, hematuria, hesitancy, urgency or vomiting. Associated symptoms comments: Low back pain. She has tried increased fluids for the symptoms. The treatment provided mild relief. There is no history of catheterization, kidney stones, recurrent UTIs, a single kidney, urinary stasis or  a urological procedure.   Last UTI staph from cath sample 03/2019.Marland Kitchen resistant to cipro, trimethoprim/sulfa  COVID 19 screen No recent travel or known exposure to [place] The patient denies respiratory symptoms of COVID 19 at this time.  The importance of social distancing was discussed today.   Review of Systems  Constitutional: Negative for chills.  Gastrointestinal: Positive for abdominal pain and nausea. Negative for vomiting.  Genitourinary: Positive for dysuria and frequency. Negative for flank pain, hematuria, hesitancy and urgency.  Musculoskeletal: Positive for back pain.      Past Medical History:  Diagnosis Date  . Allergic rhinitis, cause unspecified   . Anemia   . Anxiety state, unspecified   . Backache, unspecified   . Carpal tunnel syndrome, right    Bilateral  . CHF (congestive heart failure) ([place])    unspecified , patient denies  . Chronic pain syndrome   . Constipation   . COPD (chronic obstructive pulmonary disease) ([place])   . Cough   . Depressive disorder, not elsewhere classified    managed with medications  . Difficulty in walking   . Displaced intertrochanteric fracture of left femur ([place])   . Esophageal reflux   . Extrinsic asthma with exacerbation   . Fibromyalgia   . Heart murmur    "a small one"  . History of kidney stones   . Hyperlipidemia   . Hypertension   . Muscle weakness (generalized)   . Nicotine dependence, cigarettes, uncomplicated   . Osteoarthrosis, unspecified whether generalized or localized, unspecified site   . Other abnormalities of gait and mobility   . Other and unspecified hyperlipidemia   .  Other irritable bowel syndrome   . Other screening mammogram   . Personal history of unspecified urinary disorder   . Pneumonia   . Pressure ulcer of sacral region   . Routine general medical examination at a health care facility   . Routine gynecological examination   . Tobacco use disorder   . Type II or unspecified type diabetes  mellitus without mention of complication, not stated as uncontrolled    Type II  . Unspecified fall, subsequent encounter   . Unspecified sleep apnea   . Urinary tract infection   . Vaginitis   . Wears glasses   . Wheezing     reports that she quit smoking about 17 months ago. Her smoking use included cigarettes. She has a 35.00 pack-year smoking history. She has never used smokeless tobacco. She reports that she does not drink alcohol or use drugs.   Current Outpatient Medications:  .  ACCU-CHEK SOFTCLIX LANCETS lancets, CHECK BLOOD SUGAR TWICE DAILY AND AS DIRECTED, Disp: 100 each, Rfl: 11 .  acetaminophen (TYLENOL) 650 MG CR tablet, Take 1,300 mg by mouth every 8 (eight) hours as needed for pain., Disp: , Rfl:  .  acetaminophen-codeine (TYLENOL #2) 300-15 MG tablet, Take 1 tablet by mouth every 6 (six) hours as needed., Disp: , Rfl:  .  AIMSCO INSULIN SYR ULTRA THIN 31G X 5/16" 0.3 ML MISC, , Disp: , Rfl:  .  albuterol (VENTOLIN HFA) 108 (90 Base) MCG/ACT inhaler, Inhale 2 puffs into the lungs every 4 (four) hours as needed for wheezing or shortness of breath., Disp: 18 g, Rfl: 0 .  Alcohol Swabs PADS, Check blood sugar twice a day and as directed. Dx E11.9, Disp: 100 each, Rfl: 5 .  ALPRAZolam (XANAX) 0.5 MG tablet, TAKE 1 TABLET(0.5 MG) BY MOUTH THREE TIMES DAILY AS NEEDED FOR ANXIETY, Disp: 90 tablet, Rfl: 0 .  amitriptyline (ELAVIL) 75 MG tablet, TAKE 1 TABLET(75 MG) BY MOUTH AT BEDTIME, Disp: 30 tablet, Rfl: 5 .  atorvastatin (LIPITOR) 10 MG tablet, Take 1 tablet (10 mg total) by mouth daily., Disp: 90 tablet, Rfl: 3 .  Blood Glucose Monitoring Suppl (ACCU-CHEK AVIVA PLUS) w/Device KIT, Check blood sugar twice daily and as directed., Disp: 1 kit, Rfl: 0 .  budesonide-formoterol (SYMBICORT) 160-4.5 MCG/ACT inhaler, INHALE 2 PUFFS INTO THE LUNGS TWICE DAILY, Disp: 10.2 g, Rfl: 5 .  chlorpheniramine (CHLOR-TRIMETON) 4 MG tablet, Take 8 mg by mouth 2 (two) times daily as needed for  allergies., Disp: , Rfl:  .  feeding supplement, GLUCERNA SHAKE, (GLUCERNA SHAKE) LIQD, Take 237 mLs by mouth 2 (two) times daily between meals. (Patient taking differently: Take 237 mLs by mouth daily. ), Disp: 10 Can, Rfl: 0 .  furosemide (LASIX) 20 MG tablet, TAKE 1 TABLET(20 MG) BY MOUTH DAILY, Disp: 90 tablet, Rfl: 1 .  gabapentin (NEURONTIN) 300 MG capsule, TAKE ONE CAPSULE BY MOUTH IN THE MORNING, 1 CAPSULE IN THE AFTERNOON, AND 2 CAPSULES AT BEDTIME, Disp: 120 capsule, Rfl: 0 .  gemfibrozil (LOPID) 600 MG tablet, Take 1 tablet (600 mg total) by mouth 2 (two) times daily before a meal., Disp: 60 tablet, Rfl: 11 .  glucose blood (ACCU-CHEK AVIVA PLUS) test strip, TEST BLOOD SUGAR TWICE DAILY AND DIRECTED, Disp: 100 each, Rfl: 11 .  Insulin Glargine (LANTUS SOLOSTAR) 100 UNIT/ML Solostar Pen, ADMINISTER 35 UNITS UNDER THE SKIN AT BEDTIME (Patient taking differently: Inject 35 Units into the skin at bedtime as needed (if blood sugar is over  250). ), Disp: 15 mL, Rfl: 5 .  Insulin Pen Needle (B-D ULTRAFINE III SHORT PEN) 31G X 8 MM MISC, USE AS DIRECTED WITH INSULIN PEN, Disp: 100 each, Rfl: 3 .  Lancet Devices (ADJUSTABLE LANCING DEVICE) MISC, Check blood sugar twice a day and as directed. Dx E11.9, Disp: 100 each, Rfl: 5 .  Menthol-Methyl Salicylate (SALONPAS PAIN RELIEF PATCH) PTCH, Apply 1 patch topically daily as needed (pain)., Disp: , Rfl:  .  metFORMIN (GLUCOPHAGE) 1000 MG tablet, Take 1 tablet (1,000 mg total) by mouth 2 (two) times daily with a meal., Disp: 180 tablet, Rfl: 1 .  methocarbamol (ROBAXIN) 500 MG tablet, TAKE 2 TABLETS( 1000 MG) BY MOUTH THREE TIMES DAILY AS NEEDED FOR MUSCLE SPASMS, BETWEEN MEALS AND AT BEDTIME, Disp: 60 tablet, Rfl: 2 .  omeprazole (PRILOSEC OTC) 20 MG tablet, Take 20 mg by mouth daily. , Disp: , Rfl:  .  OVER THE COUNTER MEDICATION, Apply 1 application topically daily as needed (pain). Hemp oil, Disp: , Rfl:  .  pioglitazone (ACTOS) 45 MG tablet, TAKE 1  TABLET BY MOUTH EVERY DAY, Disp: 90 tablet, Rfl: 1 .  PROAIR HFA 108 (90 Base) MCG/ACT inhaler, TAKE 2 PUFFS BY MOUTH EVERY 4 HOURS AS NEEDED FOR WHEEZE OR FOR SHORTNESS OF BREATH, Disp: 8.5 Inhaler, Rfl: 5 .  Vitamin D, Ergocalciferol, (DRISDOL) 1.25 MG (50000 UT) CAPS capsule, Take 1 capsule (50,000 Units total) by mouth every 7 (seven) days., Disp: 5 capsule, Rfl: 0   Observations/Objective: Height 4' 9.5" (1.461 m).  Physical Exam  Physical Exam Constitutional:      General: The patient is not in acute distress. Pulmonary:     Effort: Pulmonary effort is normal. No respiratory distress.  Neurological:     Mental Status: The patient is alert and oriented to person, place, and time.  Psychiatric:        Mood and Affect: Mood normal.        Behavior: Behavior normal.   Assessment and Plan    I discussed the assessment and treatment plan with the patient. The patient was provided an opportunity to ask questions and all were answered. The patient agreed with the plan and demonstrated an understanding of the instructions.   The patient was advised to call back or seek an in-person evaluation if the symptoms worsen or if the condition fails to improve as anticipated.   Dysuria Concerning UA in pt with frequent UTIs.  Send for culture.  treat with cephalexin and increased fluids empirically.    Eliezer Lofts, MD

## 2019-11-13 NOTE — Telephone Encounter (Signed)
Sandra Ferrell notified as instructed by telephone.  She states she is going to try and collect a urine sample but if she can't she still would like Dr. [name] to call her at 3:20 pm as scheduled.

## 2019-11-13 NOTE — Telephone Encounter (Signed)
Last office visit 09/24/2019 for peripheral edema.  Last refilled 10/15/2019 for #120 with no refills.  No future appointments.

## 2019-11-13 NOTE — Telephone Encounter (Signed)
I really need urine sample for best medical care. Please encourage them to bring in sample ASAP ( she can collect when here)... if they do that now I can look at sample ASAP and send in Rx if appropriate without phone visit ( can cancel).  May be that will convince husband to bring in sample given he wants something done earlier. If they refuse.. will discuss at phone visit. I will try to work in earlier if possible

## 2019-11-13 NOTE — Telephone Encounter (Signed)
Patient called stating that she thinks she may have urinary infection or some kind of infection. She has lower abdominal pain, lower back pain, burning with urination, nausea, sneezing. Patient could not tell me how long she has had these symptoms except abdominal pain started this morning. No fever that she knows of. I asked if she could have a urine sample dropped off at the office and do a phone visit with Dr [name]. She was not sure if she could do that, husband in the background kept saying what are we going to use, how are you going to collect that, weather is bad. I asked if she could come in but due to the weather did not feel like they could and her been in the wheelchair. Husband said Dr [name] has sent in antibiotic before for the patient. I scheduled patient for phone visit at 3:20 pm but husband said something needs to be done before it is too late this evening and trying to stay out of the hospital. I advised patient I would send this message to Dr [name] to review if she has other recommendations and anything else that can be done prior to that appointment. They could not do virtual visit.

## 2019-11-15 LAB — URINE CULTURE
MICRO NUMBER:: 10022707
SPECIMEN QUALITY:: ADEQUATE

## 2019-11-16 ENCOUNTER — Ambulatory Visit: Payer: Medicare Other | Admitting: Physical Therapy

## 2019-11-18 ENCOUNTER — Ambulatory Visit: Payer: Medicare Other | Admitting: Physical Therapy

## 2019-11-19 ENCOUNTER — Other Ambulatory Visit: Payer: Self-pay | Admitting: Family Medicine

## 2019-11-19 NOTE — Telephone Encounter (Signed)
Last office visit 11/13/2019 for dysuria.  Last refilled 05/26/2019 for #30 with 5 refills.  No future appointments.

## 2019-11-23 ENCOUNTER — Other Ambulatory Visit: Payer: Self-pay | Admitting: Family Medicine

## 2019-11-23 NOTE — Telephone Encounter (Signed)
Last office visit 11/13/2019 for dysuria.  Last refilled 10/22/2019 for #90 with no refills.  No future appointments.

## 2019-11-27 DIAGNOSIS — S72142D Displaced intertrochanteric fracture of left femur, subsequent encounter for closed fracture with routine healing: Secondary | ICD-10-CM | POA: Diagnosis not present

## 2019-11-27 DIAGNOSIS — S72492S Other fracture of lower end of left femur, sequela: Secondary | ICD-10-CM | POA: Diagnosis not present

## 2019-12-02 ENCOUNTER — Telehealth: Payer: Self-pay

## 2019-12-02 ENCOUNTER — Encounter: Payer: Self-pay | Admitting: Family Medicine

## 2019-12-02 ENCOUNTER — Ambulatory Visit (INDEPENDENT_AMBULATORY_CARE_PROVIDER_SITE_OTHER): Payer: Medicare Other | Admitting: Family Medicine

## 2019-12-02 DIAGNOSIS — R110 Nausea: Secondary | ICD-10-CM | POA: Diagnosis not present

## 2019-12-02 DIAGNOSIS — R05 Cough: Secondary | ICD-10-CM | POA: Diagnosis not present

## 2019-12-02 DIAGNOSIS — R059 Cough, unspecified: Secondary | ICD-10-CM

## 2019-12-02 DIAGNOSIS — M2550 Pain in unspecified joint: Secondary | ICD-10-CM

## 2019-12-02 NOTE — Telephone Encounter (Signed)
Pt said finished abx about 1 wk or so and afterwards abd pain, back pain and burning sensation was gone. 2-3 days ago pt started with nausea, feels warm and thinks has fever but no thermometer,chills, no energy, H/a on and off, weakness, dry cough on and off; SOB upon exertion, fatigue and slight wheezing. No UTI symptoms. Pt has been taking OTC cold and flu med. Pt does not want to go to UC or ED and does not have way to do virtual; pt request phone visit scheduled 12/02/19 2:20 with Dr Lorelei Pont. UC & ED precautions given and pt voiced understanding.

## 2019-12-02 NOTE — Progress Notes (Addendum)
Sandra Fuhriman T. Brionna Romanek, MD Primary Care and Sports Medicine [facility] at [facility] [place], [address] Phone: [phone]  FAX: [phone]  [name] - 59 y.o. female  MRN [id number]  Date of Birth: [birth year]  Visit Date: 12/02/2019  PCP: Jinny Sanders, MD  Referred by: Jinny Sanders, MD  Virtual Visit via Telephone Note:  I connected with  Sandra Ferrell on 12/02/2019  2:20 PM EST by telephone and verified that I am speaking with the correct person using two identifiers.   Location patient: home phone or cell phone Location provider: work or home office Consent: Verbal consent directly obtained from Sandra Ferrell and that there may be a patient responsible charge related to this service. Persons participating in the virtual visit: patient, provider  I discussed the limitations of evaluation and management by telemedicine and the availability of in person appointments.  The patient expressed understanding and agreed to proceed.     History of Present Illness:  Chief Complaint  Patient presents with  . Nausea    (see phone note)  . Fatigue  . Chills  . Fever    ?? No thermometer  . Cough    Dry  . Headache  . Shortness of Breath    on exertion   She has symptoms as above.  She did have an E. coli UTI which was sensitive to cephalosporins and she completed a 1 week course of some Keflex.  She is currently not having any dysuria she does have body aches, chills, fatigue, productive cough, subjective fever headache, sore throat and nausea without vomiting or diarrhea.  She has not lost her taste or smell.  No new rashes or diarrhea.  Review of Systems: pertinent positives and pertinent negatives as per HPI No acute distress verbally  Past Medical History, Surgical History, Social History, Family History, Problem List, Medications, and Allergies have been reviewed and updated if relevant.     Observations/Objective/Exam:  An attempt was made to discern vital signs over the phone and per patient if applicable and possible.   Pulmonary:     Effort: Pulmonary effort is normal. No respiratory distress.  Neurological:     Mental Status: He is alert and oriented to person, place, and time.  Psychiatric:        Thought Content: Thought content normal.        Judgment: Judgment normal.   Assessment and Plan:    ICD-10-CM   1. Polyarthralgia  M25.50   2. Cough  R05   3. Nausea  R11.0    Total encounter time: 20 minutes. On the day of the patient encounter, this can include review of prior records, labs, and imaging.  Additional time can include counselling, consultation with peer MD in person or by telephone.  This also includes independent review of Radiology.  Symptoms are concerning for COVID-19.  Also could certainly be influenza.  I instructed her and gave her the telephone number to get COVID-19 testing.  Quarantine.  Tylenol or Robitussin.  I discussed the assessment and treatment plan with the patient. The patient was provided an opportunity to ask questions and all were answered. The patient agreed with the plan and demonstrated an understanding of the instructions.   The patient was advised to call back or seek an in-person evaluation if the symptoms worsen or if the condition fails to improve as anticipated.  Follow-up: prn unless noted otherwise below No  follow-ups on file.  No orders of the defined types were placed in this encounter.  No orders of the defined types were placed in this encounter.   Signed,  Maud Deed. Daisi Kentner, MD

## 2019-12-04 ENCOUNTER — Telehealth: Payer: Self-pay

## 2019-12-04 NOTE — Telephone Encounter (Signed)
Spoke with patient and her husband. Patient wanted to know if Keflex could have caused yeast infection- she has red bumps in the vaginal area and some itching. She took her antibiotic from 11/13/19 to 11/20/19. They are not sure when the bumps in the vaginal are appeared.  Also patient's husband was letting us know that patient's sugar has been running in 200s, last night sugar reading was 299 and this morning was 248 before she ate anything. Husband states patient was told to stop Actos because it was causing patient swelling (this is still on her medication list). Patient is taking metformin and has not used her insulin, she is afraid her sugar would drop too much with using it. When I asked if they had a certain question about this husband said they were just following up and did not say they had a question.  Husband said patient was wondering and spoke with Dr Lorelei Pont yesterday in regards to her needing another round of antibiotics for UTI but Dr Lorelei Pont said to wait since she improved and I confirmed with patient today that her symptoms have resolved at this time.  Dr Lorelei Pont told patient that she may have a touch of flu per husband yesterday during virtual visit and to take Tylenol and push fluids as needed. They were given information to schedule COVID testing but husband states patient has not been anywhere to catch COVID and was negative several months ago. I asked if they were asking if patient should still get tested and he said they were just letting us know and did not confirm if this was a question or not. Husband said if patient needs another phone call visit to let them know.

## 2019-12-04 NOTE — Telephone Encounter (Signed)
Let pt know:  After antibiotic.. she may have yeast infection if vaginal itching and discharge.. not likely to cause bumps.  Can use topical OTC or call in fluconazole 150 mg x 1  She needs to take insulin as directed. Sugars very high.. should not drop low.   Remain off actos if this has helped peripheral swelling improve.  No further urine testing if symptoms resolved.

## 2019-12-05 DIAGNOSIS — S72452K Displaced supracondylar fracture without intracondylar extension of lower end of left femur, subsequent encounter for closed fracture with nonunion: Secondary | ICD-10-CM | POA: Diagnosis not present

## 2019-12-05 DIAGNOSIS — S72142D Displaced intertrochanteric fracture of left femur, subsequent encounter for closed fracture with routine healing: Secondary | ICD-10-CM | POA: Diagnosis not present

## 2019-12-05 DIAGNOSIS — S72492S Other fracture of lower end of left femur, sequela: Secondary | ICD-10-CM | POA: Diagnosis not present

## 2019-12-07 NOTE — Telephone Encounter (Signed)
Jorene notified as instructed by telephone.  They have aleady gotten an OTC topical yeast medication, 7 day course which patient states is helping.  Patient states understanding about taking insulin as prescribed and not taking the Actos if helping the peripheral swelling.  Urine symptoms have resolved so no further urine testing is needed.

## 2019-12-17 NOTE — Telephone Encounter (Signed)
Pt had phone visit on 12/02/19.

## 2019-12-25 ENCOUNTER — Other Ambulatory Visit: Payer: Self-pay | Admitting: *Deleted

## 2019-12-25 MED ORDER — ALPRAZOLAM 0.5 MG PO TABS: 0.5000 mg | ORAL_TABLET | Freq: Three times a day (TID) | ORAL | 0 refills | Status: DC | PRN

## 2019-12-25 NOTE — Telephone Encounter (Signed)
Last office visit 12/02/2019 for Polyarthralgia with Dr. Lorelei Pont.  Last refilled 11/24/2019 for #90 with no refills.  No future appointments.

## 2019-12-25 NOTE — Assessment & Plan Note (Addendum)
Concerning UA in pt with frequent UTIs.  Send for culture.  Treat with cephalexin and increased fluids empirically.

## 2019-12-28 ENCOUNTER — Ambulatory Visit: Payer: Medicare Other | Admitting: Physical Therapy

## 2019-12-28 DIAGNOSIS — S72142D Displaced intertrochanteric fracture of left femur, subsequent encounter for closed fracture with routine healing: Secondary | ICD-10-CM | POA: Diagnosis not present

## 2019-12-28 DIAGNOSIS — S72492S Other fracture of lower end of left femur, sequela: Secondary | ICD-10-CM | POA: Diagnosis not present

## 2019-12-30 ENCOUNTER — Other Ambulatory Visit: Payer: Self-pay | Admitting: *Deleted

## 2019-12-30 MED ORDER — GABAPENTIN 300 MG PO CAPS: ORAL_CAPSULE | ORAL | 0 refills | Status: DC

## 2019-12-30 NOTE — Telephone Encounter (Signed)
Last office visit 12/02/2019 with Dr. Lorelei Pont for polyarthralgia.  Last refilled 11/13/2019 for #120 with no refills.  No future appointments.

## 2019-12-31 ENCOUNTER — Emergency Department (HOSPITAL_COMMUNITY): Payer: Medicare Other

## 2019-12-31 ENCOUNTER — Inpatient Hospital Stay (HOSPITAL_COMMUNITY)
Admission: EM | Admit: 2019-12-31 | Discharge: 2020-01-03 | DRG: 690 | Disposition: A | Discharge status: Home Health | Payer: Medicare Other | Attending: Internal Medicine | Admitting: Internal Medicine

## 2019-12-31 ENCOUNTER — Encounter (HOSPITAL_COMMUNITY): Payer: Self-pay | Admitting: Emergency Medicine

## 2019-12-31 DIAGNOSIS — F329 Major depressive disorder, single episode, unspecified: Secondary | ICD-10-CM | POA: Diagnosis present

## 2019-12-31 DIAGNOSIS — M4316 Spondylolisthesis, lumbar region: Secondary | ICD-10-CM | POA: Diagnosis not present

## 2019-12-31 DIAGNOSIS — F419 Anxiety disorder, unspecified: Secondary | ICD-10-CM | POA: Diagnosis present

## 2019-12-31 DIAGNOSIS — Z981 Arthrodesis status: Secondary | ICD-10-CM

## 2019-12-31 DIAGNOSIS — Z87891 Personal history of nicotine dependence: Secondary | ICD-10-CM

## 2019-12-31 DIAGNOSIS — S82112A Displaced fracture of left tibial spine, initial encounter for closed fracture: Secondary | ICD-10-CM | POA: Diagnosis not present

## 2019-12-31 DIAGNOSIS — N390 Urinary tract infection, site not specified: Principal | ICD-10-CM | POA: Diagnosis present

## 2019-12-31 DIAGNOSIS — Z743 Need for continuous supervision: Secondary | ICD-10-CM | POA: Diagnosis not present

## 2019-12-31 DIAGNOSIS — M25521 Pain in right elbow: Secondary | ICD-10-CM | POA: Diagnosis not present

## 2019-12-31 DIAGNOSIS — S8002XA Contusion of left knee, initial encounter: Secondary | ICD-10-CM | POA: Diagnosis present

## 2019-12-31 DIAGNOSIS — Y92013 Bedroom of single-family (private) house as the place of occurrence of the external cause: Secondary | ICD-10-CM | POA: Diagnosis not present

## 2019-12-31 DIAGNOSIS — M797 Fibromyalgia: Secondary | ICD-10-CM | POA: Diagnosis present

## 2019-12-31 DIAGNOSIS — S5002XA Contusion of left elbow, initial encounter: Secondary | ICD-10-CM | POA: Diagnosis present

## 2019-12-31 DIAGNOSIS — E785 Hyperlipidemia, unspecified: Secondary | ICD-10-CM | POA: Diagnosis present

## 2019-12-31 DIAGNOSIS — K219 Gastro-esophageal reflux disease without esophagitis: Secondary | ICD-10-CM | POA: Diagnosis not present

## 2019-12-31 DIAGNOSIS — F411 Generalized anxiety disorder: Secondary | ICD-10-CM | POA: Diagnosis present

## 2019-12-31 DIAGNOSIS — S72402S Unspecified fracture of lower end of left femur, sequela: Secondary | ICD-10-CM

## 2019-12-31 DIAGNOSIS — R0602 Shortness of breath: Secondary | ICD-10-CM | POA: Diagnosis not present

## 2019-12-31 DIAGNOSIS — R0789 Other chest pain: Secondary | ICD-10-CM | POA: Diagnosis not present

## 2019-12-31 DIAGNOSIS — I110 Hypertensive heart disease with heart failure: Secondary | ICD-10-CM | POA: Diagnosis not present

## 2019-12-31 DIAGNOSIS — S51012A Laceration without foreign body of left elbow, initial encounter: Secondary | ICD-10-CM | POA: Diagnosis present

## 2019-12-31 DIAGNOSIS — Z6841 Body Mass Index (BMI) 40.0 and over, adult: Secondary | ICD-10-CM | POA: Diagnosis not present

## 2019-12-31 DIAGNOSIS — Z87440 Personal history of urinary (tract) infections: Secondary | ICD-10-CM | POA: Diagnosis not present

## 2019-12-31 DIAGNOSIS — Z8781 Personal history of (healed) traumatic fracture: Secondary | ICD-10-CM

## 2019-12-31 DIAGNOSIS — G8921 Chronic pain due to trauma: Secondary | ICD-10-CM | POA: Diagnosis present

## 2019-12-31 DIAGNOSIS — X58XXXS Exposure to other specified factors, sequela: Secondary | ICD-10-CM | POA: Diagnosis present

## 2019-12-31 DIAGNOSIS — E119 Type 2 diabetes mellitus without complications: Secondary | ICD-10-CM | POA: Diagnosis present

## 2019-12-31 DIAGNOSIS — E669 Obesity, unspecified: Secondary | ICD-10-CM | POA: Diagnosis present

## 2019-12-31 DIAGNOSIS — R1084 Generalized abdominal pain: Secondary | ICD-10-CM | POA: Diagnosis not present

## 2019-12-31 DIAGNOSIS — Z20822 Contact with and (suspected) exposure to covid-19: Secondary | ICD-10-CM | POA: Diagnosis not present

## 2019-12-31 DIAGNOSIS — M545 Low back pain, unspecified: Secondary | ICD-10-CM

## 2019-12-31 DIAGNOSIS — E876 Hypokalemia: Secondary | ICD-10-CM | POA: Diagnosis present

## 2019-12-31 DIAGNOSIS — T7491XA Unspecified adult maltreatment, confirmed, initial encounter: Secondary | ICD-10-CM

## 2019-12-31 DIAGNOSIS — W06XXXA Fall from bed, initial encounter: Secondary | ICD-10-CM | POA: Diagnosis present

## 2019-12-31 DIAGNOSIS — Z886 Allergy status to analgesic agent status: Secondary | ICD-10-CM

## 2019-12-31 DIAGNOSIS — S82142A Displaced bicondylar fracture of left tibia, initial encounter for closed fracture: Secondary | ICD-10-CM | POA: Diagnosis not present

## 2019-12-31 DIAGNOSIS — S81012A Laceration without foreign body, left knee, initial encounter: Secondary | ICD-10-CM | POA: Diagnosis present

## 2019-12-31 DIAGNOSIS — M25569 Pain in unspecified knee: Secondary | ICD-10-CM

## 2019-12-31 DIAGNOSIS — Z993 Dependence on wheelchair: Secondary | ICD-10-CM

## 2019-12-31 DIAGNOSIS — M25562 Pain in left knee: Secondary | ICD-10-CM | POA: Diagnosis not present

## 2019-12-31 DIAGNOSIS — T7411XA Adult physical abuse, confirmed, initial encounter: Secondary | ICD-10-CM | POA: Diagnosis present

## 2019-12-31 DIAGNOSIS — S0993XA Unspecified injury of face, initial encounter: Secondary | ICD-10-CM | POA: Diagnosis not present

## 2019-12-31 DIAGNOSIS — Z87442 Personal history of urinary calculi: Secondary | ICD-10-CM

## 2019-12-31 DIAGNOSIS — Z885 Allergy status to narcotic agent status: Secondary | ICD-10-CM

## 2019-12-31 DIAGNOSIS — R079 Chest pain, unspecified: Secondary | ICD-10-CM | POA: Diagnosis not present

## 2019-12-31 DIAGNOSIS — M47816 Spondylosis without myelopathy or radiculopathy, lumbar region: Secondary | ICD-10-CM | POA: Diagnosis not present

## 2019-12-31 DIAGNOSIS — S72142D Displaced intertrochanteric fracture of left femur, subsequent encounter for closed fracture with routine healing: Secondary | ICD-10-CM | POA: Diagnosis not present

## 2019-12-31 DIAGNOSIS — S72452K Displaced supracondylar fracture without intracondylar extension of lower end of left femur, subsequent encounter for closed fracture with nonunion: Secondary | ICD-10-CM | POA: Diagnosis not present

## 2019-12-31 DIAGNOSIS — I509 Heart failure, unspecified: Secondary | ICD-10-CM | POA: Diagnosis present

## 2019-12-31 DIAGNOSIS — S3992XA Unspecified injury of lower back, initial encounter: Secondary | ICD-10-CM | POA: Diagnosis not present

## 2019-12-31 DIAGNOSIS — B9620 Unspecified Escherichia coli [E. coli] as the cause of diseases classified elsewhere: Secondary | ICD-10-CM | POA: Diagnosis present

## 2019-12-31 DIAGNOSIS — S72492S Other fracture of lower end of left femur, sequela: Secondary | ICD-10-CM | POA: Diagnosis not present

## 2019-12-31 DIAGNOSIS — Z794 Long term (current) use of insulin: Secondary | ICD-10-CM | POA: Diagnosis not present

## 2019-12-31 DIAGNOSIS — S0990XA Unspecified injury of head, initial encounter: Secondary | ICD-10-CM | POA: Diagnosis not present

## 2019-12-31 DIAGNOSIS — M25561 Pain in right knee: Secondary | ICD-10-CM | POA: Diagnosis not present

## 2019-12-31 DIAGNOSIS — Z800 Family history of malignant neoplasm of digestive organs: Secondary | ICD-10-CM

## 2019-12-31 DIAGNOSIS — Z823 Family history of stroke: Secondary | ICD-10-CM

## 2019-12-31 DIAGNOSIS — Z888 Allergy status to other drugs, medicaments and biological substances status: Secondary | ICD-10-CM

## 2019-12-31 DIAGNOSIS — Z79899 Other long term (current) drug therapy: Secondary | ICD-10-CM

## 2019-12-31 DIAGNOSIS — J449 Chronic obstructive pulmonary disease, unspecified: Secondary | ICD-10-CM | POA: Diagnosis not present

## 2019-12-31 LAB — URINALYSIS, ROUTINE W REFLEX MICROSCOPIC
Bilirubin Urine: NEGATIVE
Glucose, UA: NEGATIVE mg/dL
Hgb urine dipstick: NEGATIVE
Ketones, ur: NEGATIVE mg/dL
Nitrite: POSITIVE — AB
Protein, ur: 30 mg/dL — AB
Specific Gravity, Urine: 1.019 (ref 1.005–1.030)
pH: 5 (ref 5.0–8.0)

## 2019-12-31 NOTE — Social Work (Signed)
EDCSW met with Pt at bedside. Pt requested Chaplain services, Ivins asked secretary to page.  CSW  Is also working to provide Pt with a variety of resources.
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
## 2019-12-31 NOTE — ED Notes (Signed)
Chaplin at bedside

## 2019-12-31 NOTE — ED Notes (Signed)
Family at bedside. 

## 2020-01-01 ENCOUNTER — Observation Stay (HOSPITAL_COMMUNITY): Payer: Medicare Other

## 2020-01-01 ENCOUNTER — Other Ambulatory Visit: Payer: Self-pay

## 2020-01-01 DIAGNOSIS — S3992XA Unspecified injury of lower back, initial encounter: Secondary | ICD-10-CM | POA: Diagnosis not present

## 2020-01-01 DIAGNOSIS — T7411XA Adult physical abuse, confirmed, initial encounter: Secondary | ICD-10-CM

## 2020-01-01 DIAGNOSIS — N390 Urinary tract infection, site not specified: Secondary | ICD-10-CM | POA: Diagnosis present

## 2020-01-01 DIAGNOSIS — E876 Hypokalemia: Secondary | ICD-10-CM

## 2020-01-01 DIAGNOSIS — M25569 Pain in unspecified knee: Secondary | ICD-10-CM

## 2020-01-01 DIAGNOSIS — T7491XA Unspecified adult maltreatment, confirmed, initial encounter: Secondary | ICD-10-CM

## 2020-01-01 LAB — CBC WITH DIFFERENTIAL/PLATELET
Abs Immature Granulocytes: 0.07 10*3/uL (ref 0.00–0.07)
Basophils Absolute: 0.1 10*3/uL (ref 0.0–0.1)
Basophils Relative: 1 %
Eosinophils Absolute: 0.1 10*3/uL (ref 0.0–0.5)
Eosinophils Relative: 1 %
HCT: 40.6 % (ref 36.0–46.0)
Hemoglobin: 13.1 g/dL (ref 12.0–15.0)
Immature Granulocytes: 1 %
Lymphocytes Relative: 30 %
Lymphs Abs: 3.2 10*3/uL (ref 0.7–4.0)
MCH: 28.9 pg (ref 26.0–34.0)
MCHC: 32.3 g/dL (ref 30.0–36.0)
MCV: 89.6 fL (ref 80.0–100.0)
Monocytes Absolute: 0.8 10*3/uL (ref 0.1–1.0)
Monocytes Relative: 8 %
Neutro Abs: 6.2 10*3/uL (ref 1.7–7.7)
Neutrophils Relative %: 59 %
Platelets: 380 10*3/uL (ref 150–400)
RBC: 4.53 MIL/uL (ref 3.87–5.11)
RDW: 12.8 % (ref 11.5–15.5)
WBC: 10.5 10*3/uL (ref 4.0–10.5)
nRBC: 0 % (ref 0.0–0.2)

## 2020-01-01 LAB — BASIC METABOLIC PANEL
Anion gap: 11 (ref 5–15)
Anion gap: 13 (ref 5–15)
BUN: 9 mg/dL (ref 6–20)
BUN: 9 mg/dL (ref 6–20)
CO2: 26 mmol/L (ref 22–32)
CO2: 26 mmol/L (ref 22–32)
Calcium: 8 mg/dL — ABNORMAL LOW (ref 8.9–10.3)
Calcium: 8.4 mg/dL — ABNORMAL LOW (ref 8.9–10.3)
Chloride: 101 mmol/L (ref 98–111)
Chloride: 104 mmol/L (ref 98–111)
Creatinine, Ser: 0.6 mg/dL (ref 0.44–1.00)
Creatinine, Ser: 0.77 mg/dL (ref 0.44–1.00)
GFR calc Af Amer: >60 mL/min (ref >60)
GFR calc Af Amer: >60 mL/min (ref >60)
GFR calc non Af Amer: >60 mL/min (ref >60)
GFR calc non Af Amer: >60 mL/min (ref >60)
Glucose, Bld: 147 mg/dL — ABNORMAL HIGH (ref 70–99)
Glucose, Bld: 184 mg/dL — ABNORMAL HIGH (ref 70–99)
Potassium: 3.3 mmol/L — ABNORMAL LOW (ref 3.5–5.1)
Potassium: 3.3 mmol/L — ABNORMAL LOW (ref 3.5–5.1)
Sodium: 140 mmol/L (ref 135–145)
Sodium: 141 mmol/L (ref 135–145)

## 2020-01-01 LAB — HIV ANTIBODY (ROUTINE TESTING W REFLEX): HIV Screen 4th Generation wRfx: NONREACTIVE

## 2020-01-01 LAB — MAGNESIUM: Magnesium: 1.1 mg/dL — ABNORMAL LOW (ref 1.7–2.4)

## 2020-01-01 LAB — GLUCOSE, CAPILLARY
Glucose-Capillary: 178 mg/dL — ABNORMAL HIGH (ref 70–99)
Glucose-Capillary: 215 mg/dL — ABNORMAL HIGH (ref 70–99)
Glucose-Capillary: 180 mg/dL — ABNORMAL HIGH (ref 70–99)
Glucose-Capillary: 265 mg/dL — ABNORMAL HIGH (ref 70–99)

## 2020-01-01 LAB — HEMOGLOBIN A1C
Hgb A1c MFr Bld: 8.2 % — ABNORMAL HIGH (ref 4.8–5.6)
Mean Plasma Glucose: 188.64 mg/dL

## 2020-01-01 LAB — SARS CORONAVIRUS 2 (TAT 6-24 HRS): SARS Coronavirus 2: NEGATIVE

## 2020-01-01 MED ORDER — MAGNESIUM SULFATE 2 GM/50ML IV SOLN
2.0000 g | Freq: Once | INTRAVENOUS | Status: AC
  Administered 2020-01-01: 2 g via INTRAVENOUS
  Filled 2020-01-01: qty 50

## 2020-01-01 MED ORDER — ACETAMINOPHEN 650 MG RE SUPP: 650.0000 mg | Freq: Four times a day (QID) | RECTAL | Status: DC | PRN

## 2020-01-01 MED ORDER — POTASSIUM CHLORIDE CRYS ER 20 MEQ PO TBCR
40.0000 meq | EXTENDED_RELEASE_TABLET | Freq: Once | ORAL | Status: AC
  Administered 2020-01-01: 40 meq via ORAL
  Filled 2020-01-01: qty 2

## 2020-01-01 MED ORDER — ALBUTEROL SULFATE (2.5 MG/3ML) 0.083% IN NEBU: 2.5000 mg | INHALATION_SOLUTION | RESPIRATORY_TRACT | Status: DC | PRN

## 2020-01-01 MED ORDER — ATORVASTATIN CALCIUM 10 MG PO TABS
10.0000 mg | ORAL_TABLET | Freq: Every day | ORAL | Status: DC
  Administered 2020-01-01 – 2020-01-02 (×2): 10 mg via ORAL
  Filled 2020-01-01 (×2): qty 1

## 2020-01-01 MED ORDER — SALONPAS PAIN RELIEF PATCH EX PTCH: 1.0000 | MEDICATED_PATCH | Freq: Every day | CUTANEOUS | Status: DC | PRN

## 2020-01-01 MED ORDER — MOMETASONE FURO-FORMOTEROL FUM 200-5 MCG/ACT IN AERO
2.0000 | INHALATION_SPRAY | Freq: Two times a day (BID) | RESPIRATORY_TRACT | Status: DC
  Administered 2020-01-01 – 2020-01-03 (×4): 2 via RESPIRATORY_TRACT
  Filled 2020-01-01: qty 8.8

## 2020-01-01 MED ORDER — INSULIN ASPART 100 UNIT/ML ~~LOC~~ SOLN
0.0000 [IU] | Freq: Three times a day (TID) | SUBCUTANEOUS | Status: DC
  Administered 2020-01-01: 5 [IU] via SUBCUTANEOUS
  Administered 2020-01-01 (×2): 3 [IU] via SUBCUTANEOUS
  Administered 2020-01-02: 8 [IU] via SUBCUTANEOUS
  Administered 2020-01-02 (×2): 5 [IU] via SUBCUTANEOUS
  Administered 2020-01-03 (×2): 8 [IU] via SUBCUTANEOUS

## 2020-01-01 MED ORDER — HEPARIN SODIUM (PORCINE) 5000 UNIT/ML IJ SOLN: 5000.0000 [IU] | Freq: Three times a day (TID) | INTRAMUSCULAR | Status: DC

## 2020-01-01 MED ORDER — AMITRIPTYLINE HCL 25 MG PO TABS
75.0000 mg | ORAL_TABLET | Freq: Every day | ORAL | Status: DC
  Administered 2020-01-01 – 2020-01-02 (×2): 75 mg via ORAL
  Filled 2020-01-01 (×2): qty 3

## 2020-01-01 MED ORDER — INSULIN ASPART 100 UNIT/ML ~~LOC~~ SOLN
0.0000 [IU] | Freq: Every day | SUBCUTANEOUS | Status: DC
  Administered 2020-01-01 – 2020-01-02 (×2): 3 [IU] via SUBCUTANEOUS

## 2020-01-01 MED ORDER — PROMETHAZINE HCL 25 MG/ML IJ SOLN
25.0000 mg | Freq: Four times a day (QID) | INTRAMUSCULAR | Status: DC | PRN
  Administered 2020-01-01 (×2): 25 mg via INTRAVENOUS
  Filled 2020-01-01 (×2): qty 1

## 2020-01-01 MED ORDER — ALBUTEROL SULFATE HFA 108 (90 BASE) MCG/ACT IN AERS: 2.0000 | INHALATION_SPRAY | RESPIRATORY_TRACT | Status: DC | PRN

## 2020-01-01 MED ORDER — TRAMADOL HCL 50 MG PO TABS
50.0000 mg | ORAL_TABLET | Freq: Four times a day (QID) | ORAL | Status: DC | PRN
  Administered 2020-01-01 – 2020-01-03 (×6): 50 mg via ORAL
  Filled 2020-01-01 (×6): qty 1

## 2020-01-01 MED ORDER — ENOXAPARIN SODIUM 40 MG/0.4ML ~~LOC~~ SOLN
40.0000 mg | Freq: Every day | SUBCUTANEOUS | Status: DC
  Administered 2020-01-01 – 2020-01-03 (×3): 40 mg via SUBCUTANEOUS
  Filled 2020-01-01 (×3): qty 0.4

## 2020-01-01 MED ORDER — NITROFURANTOIN MONOHYD MACRO 100 MG PO CAPS
100.0000 mg | ORAL_CAPSULE | Freq: Two times a day (BID) | ORAL | Status: DC
  Administered 2020-01-01 – 2020-01-03 (×6): 100 mg via ORAL
  Filled 2020-01-01 (×7): qty 1

## 2020-01-01 MED ORDER — POTASSIUM CHLORIDE CRYS ER 20 MEQ PO TBCR
40.0000 meq | EXTENDED_RELEASE_TABLET | Freq: Once | ORAL | Status: AC
  Administered 2020-01-01: 12:00:00 40 meq via ORAL
  Filled 2020-01-01: qty 2

## 2020-01-01 MED ORDER — METHOCARBAMOL 500 MG PO TABS: 1000.0000 mg | ORAL_TABLET | Freq: Three times a day (TID) | ORAL | Status: DC | PRN

## 2020-01-01 MED ORDER — ALPRAZOLAM 0.5 MG PO TABS
0.5000 mg | ORAL_TABLET | Freq: Three times a day (TID) | ORAL | Status: DC | PRN
  Administered 2020-01-01 – 2020-01-03 (×5): 0.5 mg via ORAL
  Filled 2020-01-01 (×6): qty 1

## 2020-01-01 MED ORDER — ACETAMINOPHEN 325 MG PO TABS
650.0000 mg | ORAL_TABLET | Freq: Four times a day (QID) | ORAL | Status: DC | PRN
  Administered 2020-01-01: 650 mg via ORAL
  Filled 2020-01-01: qty 2

## 2020-01-01 NOTE — Social Work (Signed)
CSW was contacted by evening CSW Sheilagh who met with pt yesterday. Per Sheilagh the [facility] will be in touch with [facility] team and will come by the hospital to assess pt.   Have requested further clarity regarding conversation and resources given to be added to evening CSW documentation. This Probation officer also f/u with [facility] leader Denyse Amass, [place], in regards to barriers for discharge.  Westley Hummer, MSW, [place] [facility]
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
## 2020-01-02 DIAGNOSIS — B9620 Unspecified Escherichia coli [E. coli] as the cause of diseases classified elsewhere: Secondary | ICD-10-CM | POA: Diagnosis present

## 2020-01-02 DIAGNOSIS — E876 Hypokalemia: Secondary | ICD-10-CM | POA: Diagnosis not present

## 2020-01-02 DIAGNOSIS — M47816 Spondylosis without myelopathy or radiculopathy, lumbar region: Secondary | ICD-10-CM | POA: Diagnosis present

## 2020-01-02 DIAGNOSIS — X58XXXS Exposure to other specified factors, sequela: Secondary | ICD-10-CM | POA: Diagnosis present

## 2020-01-02 DIAGNOSIS — Z20822 Contact with and (suspected) exposure to covid-19: Secondary | ICD-10-CM | POA: Diagnosis present

## 2020-01-02 DIAGNOSIS — Z6841 Body Mass Index (BMI) 40.0 and over, adult: Secondary | ICD-10-CM | POA: Diagnosis not present

## 2020-01-02 DIAGNOSIS — T7411XA Adult physical abuse, confirmed, initial encounter: Secondary | ICD-10-CM | POA: Diagnosis present

## 2020-01-02 DIAGNOSIS — I110 Hypertensive heart disease with heart failure: Secondary | ICD-10-CM | POA: Diagnosis present

## 2020-01-02 DIAGNOSIS — E119 Type 2 diabetes mellitus without complications: Secondary | ICD-10-CM | POA: Diagnosis present

## 2020-01-02 DIAGNOSIS — E669 Obesity, unspecified: Secondary | ICD-10-CM | POA: Diagnosis present

## 2020-01-02 DIAGNOSIS — Z794 Long term (current) use of insulin: Secondary | ICD-10-CM | POA: Diagnosis not present

## 2020-01-02 DIAGNOSIS — Z993 Dependence on wheelchair: Secondary | ICD-10-CM | POA: Diagnosis not present

## 2020-01-02 DIAGNOSIS — Z87440 Personal history of urinary (tract) infections: Secondary | ICD-10-CM | POA: Diagnosis not present

## 2020-01-02 DIAGNOSIS — N390 Urinary tract infection, site not specified: Secondary | ICD-10-CM | POA: Diagnosis not present

## 2020-01-02 DIAGNOSIS — F329 Major depressive disorder, single episode, unspecified: Secondary | ICD-10-CM | POA: Diagnosis present

## 2020-01-02 DIAGNOSIS — I509 Heart failure, unspecified: Secondary | ICD-10-CM | POA: Diagnosis present

## 2020-01-02 DIAGNOSIS — M797 Fibromyalgia: Secondary | ICD-10-CM | POA: Diagnosis present

## 2020-01-02 DIAGNOSIS — F419 Anxiety disorder, unspecified: Secondary | ICD-10-CM | POA: Diagnosis present

## 2020-01-02 DIAGNOSIS — Y92013 Bedroom of single-family (private) house as the place of occurrence of the external cause: Secondary | ICD-10-CM | POA: Diagnosis not present

## 2020-01-02 DIAGNOSIS — K219 Gastro-esophageal reflux disease without esophagitis: Secondary | ICD-10-CM | POA: Diagnosis present

## 2020-01-02 DIAGNOSIS — F411 Generalized anxiety disorder: Secondary | ICD-10-CM | POA: Diagnosis present

## 2020-01-02 DIAGNOSIS — J449 Chronic obstructive pulmonary disease, unspecified: Secondary | ICD-10-CM | POA: Diagnosis present

## 2020-01-02 DIAGNOSIS — M4316 Spondylolisthesis, lumbar region: Secondary | ICD-10-CM | POA: Diagnosis present

## 2020-01-02 DIAGNOSIS — W06XXXA Fall from bed, initial encounter: Secondary | ICD-10-CM | POA: Diagnosis present

## 2020-01-02 DIAGNOSIS — E785 Hyperlipidemia, unspecified: Secondary | ICD-10-CM | POA: Diagnosis present

## 2020-01-02 LAB — GLUCOSE, CAPILLARY
Glucose-Capillary: 223 mg/dL — ABNORMAL HIGH (ref 70–99)
Glucose-Capillary: 278 mg/dL — ABNORMAL HIGH (ref 70–99)
Glucose-Capillary: 225 mg/dL — ABNORMAL HIGH (ref 70–99)
Glucose-Capillary: 283 mg/dL — ABNORMAL HIGH (ref 70–99)

## 2020-01-02 LAB — URINE CULTURE: Culture: >=100000 — AB

## 2020-01-02 MED ORDER — PANTOPRAZOLE SODIUM 40 MG PO TBEC
40.0000 mg | DELAYED_RELEASE_TABLET | Freq: Every day | ORAL | Status: DC
  Administered 2020-01-03: 40 mg via ORAL
  Filled 2020-01-02: qty 1

## 2020-01-02 MED ORDER — MAGNESIUM SULFATE 2 GM/50ML IV SOLN
2.0000 g | Freq: Once | INTRAVENOUS | Status: AC
  Administered 2020-01-02: 2 g via INTRAVENOUS
  Filled 2020-01-02: qty 50
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
## 2020-01-02 NOTE — Care Management Obs Status (Signed)
[place] NOTIFICATION   Patient Details  Name: Sandra PAMPLONA MRN: [id number] Date of Birth: 1961/02/21   Medicare Observation Status Notification Given:  Yes    Carles Collet, RN 01/02/2020, 9:54 AM

## 2020-01-03 LAB — BASIC METABOLIC PANEL
Anion gap: 9 (ref 5–15)
BUN: 12 mg/dL (ref 6–20)
CO2: 23 mmol/L (ref 22–32)
Calcium: 8.2 mg/dL — ABNORMAL LOW (ref 8.9–10.3)
Chloride: 105 mmol/L (ref 98–111)
Creatinine, Ser: 0.62 mg/dL (ref 0.44–1.00)
GFR calc Af Amer: >60 mL/min (ref >60)
GFR calc non Af Amer: >60 mL/min (ref >60)
Glucose, Bld: 312 mg/dL — ABNORMAL HIGH (ref 70–99)
Potassium: 4.3 mmol/L (ref 3.5–5.1)
Sodium: 137 mmol/L (ref 135–145)

## 2020-01-03 LAB — CBC
HCT: 37.9 % (ref 36.0–46.0)
Hemoglobin: 12.1 g/dL (ref 12.0–15.0)
MCH: 29 pg (ref 26.0–34.0)
MCHC: 31.9 g/dL (ref 30.0–36.0)
MCV: 90.9 fL (ref 80.0–100.0)
Platelets: 313 10*3/uL (ref 150–400)
RBC: 4.17 MIL/uL (ref 3.87–5.11)
RDW: 13 % (ref 11.5–15.5)
WBC: 7.4 10*3/uL (ref 4.0–10.5)
nRBC: 0 % (ref 0.0–0.2)

## 2020-01-03 LAB — MAGNESIUM: Magnesium: 1.7 mg/dL (ref 1.7–2.4)

## 2020-01-03 LAB — GLUCOSE, CAPILLARY
Glucose-Capillary: 297 mg/dL — ABNORMAL HIGH (ref 70–99)
Glucose-Capillary: 278 mg/dL — ABNORMAL HIGH (ref 70–99)
Glucose-Capillary: 288 mg/dL — ABNORMAL HIGH (ref 70–99)

## 2020-01-03 MED ORDER — NITROFURANTOIN MONOHYD MACRO 100 MG PO CAPS: 100.0000 mg | ORAL_CAPSULE | Freq: Two times a day (BID) | ORAL | 0 refills | Status: DC

## 2020-01-03 MED ORDER — INSULIN GLARGINE 100 UNIT/ML ~~LOC~~ SOLN
25.0000 [IU] | Freq: Every evening | SUBCUTANEOUS | Status: DC | PRN
  Filled 2020-01-03: qty 0.25

## 2020-01-03 MED ORDER — INSULIN ASPART 100 UNIT/ML ~~LOC~~ SOLN
5.0000 [IU] | Freq: Three times a day (TID) | SUBCUTANEOUS | Status: DC
  Administered 2020-01-03: 5 [IU] via SUBCUTANEOUS

## 2020-01-03 MED ORDER — INSULIN GLARGINE 100 UNIT/ML SOLOSTAR PEN: 25.0000 [IU] | PEN_INJECTOR | Freq: Every evening | SUBCUTANEOUS | Status: DC | PRN

## 2020-01-03 MED ORDER — INSULIN GLARGINE 100 UNIT/ML SOLOSTAR PEN: 35.0000 [IU] | PEN_INJECTOR | Freq: Every evening | SUBCUTANEOUS | Status: DC | PRN

## 2020-01-03 MED ORDER — INSULIN ASPART 100 UNIT/ML ~~LOC~~ SOLN: 5.0000 [IU] | Freq: Three times a day (TID) | SUBCUTANEOUS | Status: DC
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
## 2020-01-03 NOTE — Discharge Instructions (Signed)
Intimate Partner Violence Information Intimate partner violence, also called domestic abuse or relationship abuse, is a pattern of behaviors used by one partner to gain or maintain power and control over the other partner. Intimate partner violence can happen to women and men and can happen between people who are or were:  Married.  Dating.  Living together. What are the types of intimate partner violence? Intimate partner violence can involve physical, emotional, psychological, sexual, and economic abuse, or stalking by a current or former partner. Different types of abuse can occur at the same time within the same relationship.  Physical abuse. This includes rough handling, threats with a weapon, throwing objects, pushing, or hitting.  Emotional and psychological abuse. This includes verbal attacks, rejection, humiliation, intimidation, social isolation, or threats. Abuse may also include limiting contact with family and friends.  Sexual assault. Sexual assault is any unwanted sexual activity that occurs without clear permission (consent) from both people. This includes unwanted touching and sexual harassment.  Economic abuse. This includes controlling money, food, transportation, or other belongings.  Stalking. This involves such things as repeated, unwanted phone calls, e-mails, or text messages, or watching the victim from a distance. What are some warning signs of intimate partner violence? Physical signs  Bruises.  Broken bones.  Burns or cuts.  Physical pain.  Head injury. Emotional and psychological signs  Crying.  Depression.  Hopelessness.  Desperation.  Trouble sleeping.  Fear of the partner.  Anxiety.  Suicidal thoughts or behavior.  Antisocial behavior.  Low self-esteem.  Fear of intimacy.  Flashbacks. Sexual signs  Bruising, swelling, or bleeding of the genital or rectal area.  Signs of an STI, such as genital sores, warts, or discharge  coming from the genital area.  Pain in the genital area.  Unintended pregnancy.  Problems with pregnancy. What are common behaviors of those affected by intimate partner violence? Those affected by intimate partner violence may:  Be late to work or other events.  Not show up to places as promised.  Have to let their partner know where they are and who they are with.  Be isolated or kept from seeing friends or family.  Make comments about their partner's temper or behavior.  Make excuses for their partner.  Engage in high-risk sexual behaviors.  Use drugs or alcohol.  Have unhealthy eating behaviors. What are common feelings of those affected by intimate partner violence? Victims of intimate partner violence may feel that they:  Must be careful not to say or do things that trigger their partner's anger.  Cannot do anything right.  Deserve to be treated badly.  Overreact to their partner's behavior or temper.  Cannot trust their own feelings.  Cannot trust other people.  Are trapped.  May have their children taken away by their partner.  Are emotionally drained or numb.  Are in danger.  Might have to kill their partner to survive. Where can you get help? If you do not feel safe searching for help online at home, use a computer at a [employer] to access the Internet. Call 911 if you are in immediate danger or need medical help. Intimate partner violence hotlines and websites  The [employer] Violence Hotline. ? 24-hour phone hotline: 515 385 5379 (SAFE) or 717 741 0403 (TTY). ? Videophone: available Monday through Friday, 9 a.m. to 5 p.m. Call 606-048-0557. ? thehotline.org  The National Sexual Assault Hotline. ? 24-hour phone hotline: (352)884-1912. ? safehelpline.org Shelters for victims of intimate partner violence If you are a  victim of intimate partner violence, there are resources to help you find a temporary place for you and your  children to live (shelter). The specific address of these shelters is often not known to the public. Police Report assaults, threats, and stalking to the police. Counselors and counseling centers  Counseling can help you cope with difficult emotions and empower you to plan for your future safety. The topics you discuss with a counselor are private and confidential. Children of intimate partner violence victims also might need counseling to manage stress and anxiety. The court system You can work with a Chief Executive Officer or an advocate to get legal protection against an abuser. Protection includes restraining orders and private addresses. Crimes against you, such as assault, can also be prosecuted through the courts. Laws vary by state. Follow these instructions at home:  Create a safety plan that includes ways to remain safe while you are in an abusive relationship, while you are planning to leave, or after you leave. This plan may be created by the victim alone or with assistance from the domestic violence hotline staff or local shelter staff. Your safety plan may include: ? How to cope with emotions. ? How to tell friends and family about the abuse. ? How to take legal action. ? How to create a safe home environment. ? How to keep your children safe. ? Emergency plans for life-threatening situations. Get help right away if you:  Feel like you are in immediate danger.  Feel like you may hurt yourself or others. If you ever feel like you may hurt yourself or others, or have thoughts about taking your own life, get help right away. You can go to your nearest emergency department or call:  Your local emergency services (911 in the [place]  A suicide crisis helpline, such as the [place] at 607 444 8168. This is open 24 hours a day. Summary  If you are a victim of intimate partner violence, there are resources to help you find a temporary place for you and your children to  live (shelter).  Create a safety plan that includes ways to remain safe while you are in an abusive relationship, while you are planning to leave, or after you leave. This information is not intended to replace advice given to you by your health care provider. Make sure you discuss any questions you have with your health care provider. Document Revised: 03/31/2019 Document Reviewed: 12/06/2017 Elsevier Patient Education  [place]   Per [facility] patient can call them if she decides she is unable to care for self appropriately at home and they can offer admission then. Please reach out to [phone].  Additionally [facility] will offer service to patient beginning Tuesday or Wednesday. Feel free to reach out to their liaison Malachy Mood if you have any questions or concerns at [phone]

## 2020-01-03 NOTE — Progress Notes (Signed)
Pt wheelchair bound at baseline. Recommended pt to go home via PTAR for safety but pt declined. Pt will go home via husband's car as transportation.

## 2020-01-03 NOTE — Progress Notes (Signed)
RN called to room. Pt stated she wanted to be discharged home with family instead of being discharged to [facility]. States that she has family that can take care of her there. Social Worker notified. MD Notified. Will continue to monitor.

## 2020-01-03 NOTE — Progress Notes (Signed)
Pt given discharge summary and discharged home via Husband's Car. [facility]s WDL, tele off, and transported to car via wheelchair. Spoke to pt alone. Pt stated that she is ok with going home without PTAR transportation and that she feels safe going home with husband.
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
## 2020-01-03 NOTE — Progress Notes (Addendum)
Pt informed MD and primary RN that she wants her husband to visit  Pt requested confidential status to be removed  Pt is alert and oriented CN went to bedside, verified pt wants husband to visit and confidential status removed, pt stated yes  MD aware    CN informed social work, [facility], and security  CN called ED admitting to remove per pt request Security escorted pt husband to bedside
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
## 2020-01-04 ENCOUNTER — Telehealth: Payer: Self-pay

## 2020-01-04 ENCOUNTER — Ambulatory Visit: Payer: Medicare Other | Admitting: Physical Therapy

## 2020-01-04 ENCOUNTER — Other Ambulatory Visit: Payer: Self-pay | Admitting: *Deleted

## 2020-01-04 MED ORDER — FUROSEMIDE 20 MG PO TABS: ORAL_TABLET | ORAL | 1 refills | Status: DC
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
## 2020-01-04 NOTE — Telephone Encounter (Signed)
TCM completed. Please forward to clinical staff for continuous follow up.
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
## 2020-01-04 NOTE — Telephone Encounter (Signed)
Spoke with Sandra Ferrell.  She states she has no concerns and that she was fine other than her blood sugar is still running >200 mg/dl.  She states she is using her Lantus Solostar.  I was able to schedule in person hospital follow up for 01/15/2020 at 3:20 pm with Dr. [name].

## 2020-01-06 ENCOUNTER — Other Ambulatory Visit: Payer: Self-pay

## 2020-01-06 ENCOUNTER — Encounter: Payer: Self-pay | Admitting: Physical Therapy

## 2020-01-06 ENCOUNTER — Ambulatory Visit: Payer: Medicare Other | Attending: Student | Admitting: Physical Therapy

## 2020-01-06 DIAGNOSIS — M6281 Muscle weakness (generalized): Secondary | ICD-10-CM

## 2020-01-06 DIAGNOSIS — M79605 Pain in left leg: Secondary | ICD-10-CM | POA: Diagnosis not present

## 2020-01-06 DIAGNOSIS — R2689 Other abnormalities of gait and mobility: Secondary | ICD-10-CM | POA: Diagnosis not present

## 2020-01-07 ENCOUNTER — Encounter: Payer: Self-pay | Admitting: Physical Therapy
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
## 2020-01-07 NOTE — Addendum Note (Signed)
Addended by: Carney Living on: 01/07/2020 11:46 AM   Modules accepted: Orders

## 2020-01-15 ENCOUNTER — Inpatient Hospital Stay: Payer: Medicare Other | Admitting: Family Medicine

## 2020-01-19 ENCOUNTER — Ambulatory Visit: Payer: Medicare Other | Admitting: Physical Therapy

## 2020-01-22 ENCOUNTER — Inpatient Hospital Stay: Payer: Medicare Other | Admitting: Family Medicine

## 2020-01-22 ENCOUNTER — Other Ambulatory Visit: Payer: Self-pay | Admitting: *Deleted

## 2020-01-22 MED ORDER — METHOCARBAMOL 500 MG PO TABS: ORAL_TABLET | ORAL | 2 refills | Status: DC

## 2020-01-22 MED ORDER — ALPRAZOLAM 0.5 MG PO TABS: 0.5000 mg | ORAL_TABLET | Freq: Three times a day (TID) | ORAL | 0 refills | Status: DC | PRN

## 2020-01-22 NOTE — Telephone Encounter (Signed)
Alprazolam last refilled 12/25/2019 for #90 with no refills.

## 2020-01-22 NOTE — Telephone Encounter (Signed)
Last office visit 12/02/2019 with Dr. Lorelei Pont for [place].  Last refilled 10/26/2019 for #60 with 2 refills.  Next Appt: 01/26/2020 for hospital follow up.

## 2020-01-22 NOTE — Addendum Note (Signed)
Addended by: Carter Kitten on: 01/22/2020 02:50 PM   Modules accepted: Orders

## 2020-01-25 DIAGNOSIS — S72142D Displaced intertrochanteric fracture of left femur, subsequent encounter for closed fracture with routine healing: Secondary | ICD-10-CM | POA: Diagnosis not present

## 2020-01-25 DIAGNOSIS — S72492S Other fracture of lower end of left femur, sequela: Secondary | ICD-10-CM | POA: Diagnosis not present

## 2020-01-26 ENCOUNTER — Inpatient Hospital Stay: Payer: Medicare Other | Admitting: Family Medicine

## 2020-01-27 ENCOUNTER — Other Ambulatory Visit: Payer: Self-pay | Admitting: *Deleted

## 2020-01-27 NOTE — Telephone Encounter (Addendum)
Last office visit 12/02/2019 with Dr. Lorelei Pont for [place].  Last refilled 12/30/2019 for #120 with no refills.  Next Appt: 02/16/2020 for hospital follow up.

## 2020-01-28 MED ORDER — GABAPENTIN 300 MG PO CAPS: ORAL_CAPSULE | ORAL | 0 refills | Status: DC

## 2020-02-02 ENCOUNTER — Ambulatory Visit: Payer: Medicare Other | Admitting: Physical Therapy

## 2020-02-02 DIAGNOSIS — S72452K Displaced supracondylar fracture without intracondylar extension of lower end of left femur, subsequent encounter for closed fracture with nonunion: Secondary | ICD-10-CM | POA: Diagnosis not present

## 2020-02-02 DIAGNOSIS — S72142D Displaced intertrochanteric fracture of left femur, subsequent encounter for closed fracture with routine healing: Secondary | ICD-10-CM | POA: Diagnosis not present

## 2020-02-02 DIAGNOSIS — S72492S Other fracture of lower end of left femur, sequela: Secondary | ICD-10-CM | POA: Diagnosis not present

## 2020-02-09 ENCOUNTER — Ambulatory Visit: Payer: Medicare Other | Admitting: Physical Therapy

## 2020-02-09 ENCOUNTER — Telehealth: Payer: Self-pay | Admitting: Physical Therapy

## 2020-02-09 NOTE — Telephone Encounter (Signed)
Sandra Ferrell (primary PT) initiated call to patient and her husband to discuss PT discharge due to the attendance policy. Pt/husband did not understand the rationale and Sandra Ferrell had another patient waiting on him up front so I continued the call. Spent approximately 45 minutes on the phone with Sandra Ferrell and Sandra Ferrell explaining the number of late cancels and the limited number of visits during her PT plan of care. (6 visits since initial eval on 08/19/2019). They both reported numerous barriers including transportation, weather, sickness, hospitalization, etc. Last visit was 01/06/20 and patient scheduled for 3 follow-up appointments, all of which were cancelled by the patient. Prior to that, the patient's last appointment was in December 2020. I explained that we cannot successfully provide PT intervention without routine attendance and continued late cancellations would result in discharge per our attendance policy. We partnered to find a solution that would meet the patient's needs. The primary remaining barrier is that they have to consider the weather when coming to therapy as they have a ramp that Sandra Ferrell has to pull out and no carport or covering at their house. They report it is dangerous to bring her down the ramp when it is wet and they worry about catching a cold. Because we cannot control the weather, we agreed to take a different approach to scheduling her PT appointments moving forward. They will call our office 24-48 hours in advance to schedule 1 appointment at a time. They understand this may mean they see a different provider each time. I explained that while this is not ideal, it appears to be the best option to meet their needs at this time. They have already had HHPT and were discharged and Sandra Ferrell reports they need the equipment and parallel bars to progress. Appointment scheduled for tomorrow 4/7 @ 2:15 on Sandra Ferrell. Will also update appointment notes for scheduling future appointments.

## 2020-02-10 ENCOUNTER — Ambulatory Visit: Payer: Medicare Other | Admitting: Physical Therapy

## 2020-02-10 NOTE — Telephone Encounter (Signed)
Patient called today to cancel appointment stating that they decided to go out of town. They have had a lot going on and just need to get away. Patient in agreement that now is just not a good time for therapy. We will discharge and she will work with MD to send a new referral when she is in a place they can attend appointments regularly. Husband also agreed with plan at that time of the call. (He was talking with me in the background.) Will ask primary PT to send a DC summary to MD and patient has no future appointments on the schedule at [facility].

## 2020-02-11 ENCOUNTER — Telehealth: Payer: Self-pay | Admitting: Family Medicine

## 2020-02-11 DIAGNOSIS — E119 Type 2 diabetes mellitus without complications: Secondary | ICD-10-CM

## 2020-02-11 MED ORDER — ACCU-CHEK AVIVA PLUS W/DEVICE KIT: PACK | 0 refills | Status: AC

## 2020-02-11 NOTE — Telephone Encounter (Signed)
Pt states that she is needing a new glucose meter - her current meter is not working like its supposed to.  Pt states that the pharmacy sent over a faxed request  - Butch Penny have you received this?  Pt just picked up a Rx for strips the other day so she will need the same type meter sent to the pharmacy.   Pharmacy: [facility]

## 2020-02-11 NOTE — Telephone Encounter (Signed)
I have not received a fax from [facility] but I will send in Rx for Accu-Chek Aviva Plus Meter.

## 2020-02-15 NOTE — Telephone Encounter (Signed)
Patient called for me twice this AM. Returned call. Patient reports she and her husband decided not to go out of town until July and wishes to continue with PT. I explained we have discharged as discussed last week and recommended she reach out to Dr. Doreatha Martin for a follow-up visit and discuss returning to PT. Pt reports she needed to follow-up with him anyway.

## 2020-02-16 ENCOUNTER — Telehealth: Payer: Self-pay | Admitting: Family Medicine

## 2020-02-16 ENCOUNTER — Inpatient Hospital Stay: Payer: Medicare Other | Admitting: Family Medicine

## 2020-02-16 NOTE — Telephone Encounter (Signed)
Pt called back - visit switched to Phone Visit -- pt aware that we will call back and let them know if this will not work per Dr [name]. She is having allergy/cold symptoms and cannot come in the office d/t symptoms. Did not want to reschedule if possible, just wanted to do a phone visit if Dr [name] would do it.   appt is at 3pm this afternoon. They are requesting a call back asap.
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
## 2020-02-16 NOTE — Telephone Encounter (Signed)
Patient called today about her appointment She would like to know if her hospital follow up could be done over the phone. Patient said she is not feeling well today.  If not she said she would reschedule if okay   What do you suggest?

## 2020-02-16 NOTE — Telephone Encounter (Signed)
I spoke with the patient. She first stated that she was not having any cold symptoms. After confirming that she would have an in office visit. She stated that she is having a cough and chills and did not want to come to the office but wanted virtual.

## 2020-02-16 NOTE — Telephone Encounter (Signed)
Patient called back and rescheduled appointment Spoke with Dr [name] and this appointment Must be in person.
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
## 2020-02-16 NOTE — Chronic Care Management (AMB) (Signed)
°  Chronic Care Management   Note  02/16/2020 Name: Sandra Ferrell MRN: [id number] DOB: Jan 29, 1961  Sandra Ferrell is a 59 y.o. year old female who is a primary care patient of Bedsole, Amy E, MD. I reached out to Sandra Ferrell by phone today in response to a referral sent by Ms. Shaune Spittle Bodey's PCP, Jinny Sanders, MD.   Ms. Schoenberger was given information about Chronic Care Management services today including:  1. CCM service includes personalized support from designated clinical staff supervised by her physician, including individualized plan of care and coordination with other care providers 2. 24/7 contact phone numbers for assistance for urgent and routine care needs. 3. Service will only be billed when office clinical staff spend 20 minutes or more in a month to coordinate care. 4. Only one practitioner may furnish and bill the service in a calendar month. 5. The patient may stop CCM services at any time (effective at the end of the month) by phone call to the office staff.   Patient agreed to services and verbal consent obtained.   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

## 2020-02-17 ENCOUNTER — Other Ambulatory Visit: Payer: Self-pay | Admitting: *Deleted

## 2020-02-17 MED ORDER — ALPRAZOLAM 0.5 MG PO TABS: 0.5000 mg | ORAL_TABLET | Freq: Three times a day (TID) | ORAL | 0 refills | Status: DC | PRN
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
## 2020-02-17 NOTE — Telephone Encounter (Signed)
Noted  
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
## 2020-02-17 NOTE — Telephone Encounter (Signed)
Last office visit 12/02/2019 for polyarthralgia.  Last refilled 01/22/2020 for #90 with no refills .   Next Appt: 02/18/2020 for hospital follow up.

## 2020-02-18 ENCOUNTER — Telehealth (INDEPENDENT_AMBULATORY_CARE_PROVIDER_SITE_OTHER): Payer: Medicare Other | Admitting: Family Medicine

## 2020-02-18 ENCOUNTER — Other Ambulatory Visit: Payer: Self-pay | Admitting: Family Medicine

## 2020-02-18 ENCOUNTER — Encounter: Payer: Self-pay | Admitting: Family Medicine

## 2020-02-18 DIAGNOSIS — J441 Chronic obstructive pulmonary disease with (acute) exacerbation: Secondary | ICD-10-CM

## 2020-02-18 DIAGNOSIS — J301 Allergic rhinitis due to pollen: Secondary | ICD-10-CM | POA: Diagnosis not present

## 2020-02-18 MED ORDER — PREDNISONE 10 MG PO TABS: 10.0000 mg | ORAL_TABLET | Freq: Every day | ORAL | 0 refills | Status: DC

## 2020-02-18 NOTE — Telephone Encounter (Signed)
Given respiratory symptoms it is not appropriate to  do in person exam although this will need to be done  after COVID eval completed. We can do a virtual or phone visit with her for acute symptoms today... will review other issues at next appt.

## 2020-02-18 NOTE — Patient Instructions (Signed)
Complete prednisone. Use albuterol as needed for wheeze.  Start clartin or Zyrtec daily for allergies.  If not improving make sure to get COVID test and call office.
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
## 2020-02-18 NOTE — Telephone Encounter (Signed)
I have attempted X 2 to reach patient and inform her that we will do the phone visit given her respiratory symptoms but cannot reach patient and no way to leave a message.   Will continue to try to reach patient.  Thanks.

## 2020-02-18 NOTE — Progress Notes (Signed)
VIRTUAL VISIT Due to national recommendations of social distancing due to [place] 19, a virtual visit is felt to be most appropriate for this patient at this time.   I connected with the patient on 02/18/20 at  3:00 PM EDT by virtual telehealth platform and verified that I am speaking with the correct person using two identifiers.    Interactive audio and video telecommunications were attempted between this provider and patient, however failed, due to patient having technical difficulties OR patient did not have access to video capability.  We continued and completed visit with audio only.   I discussed the limitations, risks, security and privacy concerns of performing an evaluation and management service by  virtual telehealth platform and the availability of in person appointments. I also discussed with the patient that there may be a patient responsible charge related to this service. The patient expressed understanding and agreed to proceed.  Patient location: Home Provider Location: [place] Participants: Eliezer Lofts and [name]   Chief Complaint  Patient presents with  . Hospitalization Follow-up    History of Present Illness:  59 year old female presents for follow up hospital follow up  Admitted in 12/31/2019.Marland Kitchen lost balance.. fell out of bed and hit left knee...bruised area. No fracture seen. Knee pain now improving. UTI resolved.  Now here lately she has been having productive cough x 1 week. No fever.  Had SOB, mild wheeze... was using inhaler 3-4 times daily... improving now. Using mucinex... now is using mucus more.  No recent COVID test.  She has a history of allergies.   DM: A1C went back up to 8.2 in 12/2019  Has been eating donuts and sweet tea... she has been working .  FBS back to 149 this AM   COVID 19 screen No recent travel or known exposure to COVID19 The patient denies respiratory symptoms of COVID 19 at this time.  The importance of  social distancing was discussed today.   ROS    Past Medical History:  Diagnosis Date  . Allergic rhinitis, cause unspecified   . Anemia   . Anxiety state, unspecified   . Backache, unspecified   . Carpal tunnel syndrome, right    Bilateral  . CHF (congestive heart failure) ([place])    unspecified , patient denies  . Chronic pain syndrome   . Constipation   . COPD (chronic obstructive pulmonary disease) ([place])   . Cough   . Depressive disorder, not elsewhere classified    managed with medications  . Difficulty in walking   . Displaced intertrochanteric fracture of left femur ([place])   . Esophageal reflux   . Extrinsic asthma with exacerbation   . Fibromyalgia   . Heart murmur    "a small one"  . History of kidney stones   . Hyperlipidemia   . Hypertension   . Muscle weakness (generalized)   . Nicotine dependence, cigarettes, uncomplicated   . Osteoarthrosis, unspecified whether generalized or localized, unspecified site   . Other abnormalities of gait and mobility   . Other and unspecified hyperlipidemia   . Other irritable bowel syndrome   . Other screening mammogram   . Personal history of unspecified urinary disorder   . Pneumonia   . Pressure ulcer of sacral region   . Routine general medical examination at a health care facility   . Routine gynecological examination   . Tobacco use disorder   . Type II or unspecified type diabetes mellitus without mention of  complication, not stated as uncontrolled    Type II  . Unspecified fall, subsequent encounter   . Unspecified sleep apnea   . Urinary tract infection   . Vaginitis   . Wears glasses   . Wheezing     reports that she quit smoking about 20 months ago. Her smoking use included cigarettes. She has a 35.00 pack-year smoking history. She has never used smokeless tobacco. She reports that she does not drink alcohol or use drugs.   Current Outpatient Medications:  .  ACCU-CHEK SOFTCLIX LANCETS lancets, CHECK BLOOD  SUGAR TWICE DAILY AND AS DIRECTED, Disp: 100 each, Rfl: 11 .  acetaminophen (TYLENOL) 650 MG CR tablet, Take 1,300 mg by mouth every 8 (eight) hours as needed for pain., Disp: , Rfl:  .  acetaminophen-codeine (TYLENOL #2) 300-15 MG tablet, Take 1 tablet by mouth every 6 (six) hours as needed for moderate pain. , Disp: , Rfl:  .  AIMSCO INSULIN SYR ULTRA THIN 31G X 5/16" 0.3 ML MISC, , Disp: , Rfl:  .  albuterol (VENTOLIN HFA) 108 (90 Base) MCG/ACT inhaler, Inhale 2 puffs into the lungs every 4 (four) hours as needed for wheezing or shortness of breath., Disp: 18 g, Rfl: 0 .  Alcohol Swabs PADS, Check blood sugar twice a day and as directed. Dx E11.9, Disp: 100 each, Rfl: 5 .  ALPRAZolam (XANAX) 0.5 MG tablet, Take 1 tablet (0.5 mg total) by mouth 3 (three) times daily as needed for anxiety., Disp: 90 tablet, Rfl: 0 .  amitriptyline (ELAVIL) 75 MG tablet, TAKE 1 TABLET(75 MG) BY MOUTH AT BEDTIME, Disp: 30 tablet, Rfl: 5 .  atorvastatin (LIPITOR) 10 MG tablet, Take 1 tablet (10 mg total) by mouth daily., Disp: 90 tablet, Rfl: 3 .  Blood Glucose Monitoring Suppl (ACCU-CHEK AVIVA PLUS) w/Device KIT, Check blood sugar twice daily and as directed., Disp: 1 kit, Rfl: 0 .  budesonide-formoterol (SYMBICORT) 160-4.5 MCG/ACT inhaler, INHALE 2 PUFFS INTO THE LUNGS TWICE DAILY, Disp: 10.2 g, Rfl: 5 .  chlorpheniramine (CHLOR-TRIMETON) 4 MG tablet, Take 8 mg by mouth 2 (two) times daily as needed for allergies., Disp: , Rfl:  .  furosemide (LASIX) 20 MG tablet, TAKE 1 TABLET(20 MG) BY MOUTH DAILY, Disp: 90 tablet, Rfl: 1 .  gabapentin (NEURONTIN) 300 MG capsule, TAKE ONE CAPSULE BY MOUTH IN THE MORNING, 1 CAPSULE IN THE AFTERNOON, AND 2 CAPSULES AT BEDTIME, Disp: 120 capsule, Rfl: 0 .  gemfibrozil (LOPID) 600 MG tablet, Take 1 tablet (600 mg total) by mouth 2 (two) times daily before a meal., Disp: 60 tablet, Rfl: 11 .  glucose blood (ACCU-CHEK AVIVA PLUS) test strip, TEST BLOOD SUGAR TWICE DAILY AND DIRECTED,  Disp: 100 each, Rfl: 11 .  Insulin Glargine (LANTUS SOLOSTAR) 100 UNIT/ML Solostar Pen, ADMINISTER 35 UNITS UNDER THE SKIN AT BEDTIME (Patient taking differently: Inject 35 Units into the skin at bedtime as needed (if blood sugar is over 250). ), Disp: 15 mL, Rfl: 5 .  Insulin Pen Needle (B-D ULTRAFINE III SHORT PEN) 31G X 8 MM MISC, USE AS DIRECTED WITH INSULIN PEN, Disp: 100 each, Rfl: 3 .  Lancet Devices (ADJUSTABLE LANCING DEVICE) MISC, Check blood sugar twice a day and as directed. Dx E11.9, Disp: 100 each, Rfl: 5 .  Menthol-Methyl Salicylate (SALONPAS PAIN RELIEF PATCH) PTCH, Apply 1 patch topically daily as needed (pain)., Disp: , Rfl:  .  metFORMIN (GLUCOPHAGE) 1000 MG tablet, TAKE 1 TABLET(1000 MG) BY MOUTH TWICE DAILY WITH A MEAL,  Disp: 180 tablet, Rfl: 1 .  methocarbamol (ROBAXIN) 500 MG tablet, TAKE 2 TABLETS( 1000 MG) BY MOUTH THREE TIMES DAILY AS NEEDED FOR MUSCLE SPASMS, BETWEEN MEALS AND AT BEDTIME, Disp: 60 tablet, Rfl: 2 .  omeprazole (PRILOSEC OTC) 20 MG tablet, Take 20 mg by mouth daily. , Disp: , Rfl:  .  OVER THE COUNTER MEDICATION, Apply 1 application topically daily as needed (pain). Hemp oil, Disp: , Rfl:  .  PROAIR HFA 108 (90 Base) MCG/ACT inhaler, TAKE 2 PUFFS BY MOUTH EVERY 4 HOURS AS NEEDED FOR WHEEZE OR FOR SHORTNESS OF BREATH, Disp: 8.5 Inhaler, Rfl: 5 .  Vitamin D, Ergocalciferol, (DRISDOL) 1.25 MG (50000 UT) CAPS capsule, Take 1 capsule (50,000 Units total) by mouth every 7 (seven) days., Disp: 5 capsule, Rfl: 0   Observations/Objective: There were no vitals taken for this visit.  Physical Exam  Physical Exam Constitutional:      General: The patient is not in acute distress. Pulmonary:     Effort: Pulmonary effort is normal. No respiratory distress.  Neurological:     Mental Status: The patient is alert and oriented to person, place, and time.  Psychiatric:        Mood and Affect: Mood normal.        Behavior: Behavior normal.   Assessment and  Plan Allergic rhinitis  Start clartin or Zyrtec daily for allergies.  COPD exacerbation (HCC) Complete prednisone. Use albuterol as needed for wheeze.     I discussed the assessment and treatment plan with the patient. The patient was provided an opportunity to ask questions and all were answered. The patient agreed with the plan and demonstrated an understanding of the instructions.   The patient was advised to call back or seek an in-person evaluation if the symptoms worsen or if the condition fails to improve as anticipated.   Telephone visit: Time 19 min  Eliezer Lofts, MD

## 2020-02-25 DIAGNOSIS — S72142D Displaced intertrochanteric fracture of left femur, subsequent encounter for closed fracture with routine healing: Secondary | ICD-10-CM | POA: Diagnosis not present

## 2020-02-25 DIAGNOSIS — S72492S Other fracture of lower end of left femur, sequela: Secondary | ICD-10-CM | POA: Diagnosis not present

## 2020-03-04 ENCOUNTER — Other Ambulatory Visit: Payer: Self-pay | Admitting: Family Medicine

## 2020-03-04 DIAGNOSIS — S72492S Other fracture of lower end of left femur, sequela: Secondary | ICD-10-CM | POA: Diagnosis not present

## 2020-03-04 DIAGNOSIS — S72142D Displaced intertrochanteric fracture of left femur, subsequent encounter for closed fracture with routine healing: Secondary | ICD-10-CM | POA: Diagnosis not present

## 2020-03-04 DIAGNOSIS — S72452K Displaced supracondylar fracture without intracondylar extension of lower end of left femur, subsequent encounter for closed fracture with nonunion: Secondary | ICD-10-CM | POA: Diagnosis not present

## 2020-03-04 NOTE — Telephone Encounter (Signed)
Last office visit 02/18/2020 for hospital follow up.  Last refilled 01/28/2020 for #120 with no refills.  Next Appt: 03/15/2020 for DM.

## 2020-03-07 ENCOUNTER — Telehealth: Payer: Self-pay

## 2020-03-07 DIAGNOSIS — I10 Essential (primary) hypertension: Secondary | ICD-10-CM

## 2020-03-07 DIAGNOSIS — J449 Chronic obstructive pulmonary disease, unspecified: Secondary | ICD-10-CM

## 2020-03-07 NOTE — Telephone Encounter (Signed)
Per written referral from PCP, requesting referral in Epic for [name] to chronic care management pharmacy services for the following conditions:   Essential hypertension, benign  [I10]  COPD [J44.9]  Debbora Dus, PharmD Clinical Pharmacist [facility] [phone]

## 2020-03-08 NOTE — Telephone Encounter (Signed)
Please sign referral

## 2020-03-10 ENCOUNTER — Other Ambulatory Visit: Payer: Self-pay

## 2020-03-10 ENCOUNTER — Telehealth: Payer: Medicare Other

## 2020-03-10 ENCOUNTER — Ambulatory Visit: Payer: Medicare Other

## 2020-03-10 ENCOUNTER — Telehealth: Payer: Self-pay

## 2020-03-10 DIAGNOSIS — E785 Hyperlipidemia, unspecified: Secondary | ICD-10-CM

## 2020-03-10 DIAGNOSIS — E119 Type 2 diabetes mellitus without complications: Secondary | ICD-10-CM

## 2020-03-10 NOTE — Telephone Encounter (Signed)
Sure. Doubt its doing much.

## 2020-03-10 NOTE — Telephone Encounter (Signed)
PCP consult regarding CCM visit 03/10/20:  Pt previously unable to tolerate statin and on gemfibrozil for many years. She has now been on atorvastatin 10 mg since 09/2019, tolerating well. Could we discontinue gemfibrozil due to pill burden/drug interaction?  Debbora Dus, PharmD Clinical Pharmacist [facility] at [facility] [phone]

## 2020-03-10 NOTE — Chronic Care Management (AMB) (Signed)
Chronic Care Management Pharmacy  Name: Sandra Ferrell  MRN: [id number] DOB: May 06, 1961  Chief Complaint/ HPI  Sandra Ferrell,  59 y.o., female presents for their Initial CCM visit with the clinical pharmacist via telephone.  PCP : Sandra Sanders, MD  Their chronic conditions include: hypertension, heart failure, allergic rhinitis, sleep apnea, COPD, GERD, diabetes, osteoarthritis, hyperlipidemia, GAD, depression, fibromyalgia  Patient concerns: denies medication questions/concerns   Office Visits:  02/18/20: Sandra Ferrell - hospital visit 12/31/19 due to fall, improving, productive cough x 1 week, no fever, mild wheeze, using albuterol 3-4x daily, improving; DM A1c increased due to diet --> prednisone course, otc antihistamine for allergies; albuterol PRN  Consult Visit:  12/31/19: ED - UTI  Allergies  Allergen Reactions  . Benazepril Anaphylaxis, Swelling and Other (See Comments)    Angioedema, throat swelling  . Diclofenac Sodium Other (See Comments)    GI bleed  . Fluticasone-Salmeterol Hives, Itching and Swelling    Tongue was swollen  . Nsaids Other (See Comments)    Rectal bleeding  . Penicillins Hives and Rash    Did it involve swelling of the face/tongue/throat, SOB, or low BP? No Did it involve sudden or severe rash/hives, skin peeling, or any reaction on the inside of your mouth or nose? No Did you need to seek medical attention at a hospital or doctor's office? No When did it last happen?5 - 6 years If all above answers are "NO", may proceed with cephalosporin use.   Marland Kitchen Morphine And Related     Rash and itching with morphine Patient states she is able to tolerate tramadol without any problems.  . Pioglitazone Swelling    Legs and feet  . Aspirin Other (See Comments)    Burns stomach  . Propoxyphene Other (See Comments)    Darvocet - Headache   Medications: Outpatient Encounter Medications as of 03/10/2020  Medication Sig Note  . gabapentin (NEURONTIN)  300 MG capsule TAKE ONE CAPSULE BY MOUTH IN THE MORNING, 1 CAPSULE IN THE AFTERNOON, AND 2 CAPSULES AT BEDTIME   . ACCU-CHEK SOFTCLIX LANCETS lancets CHECK BLOOD SUGAR TWICE DAILY AND AS DIRECTED   . acetaminophen (TYLENOL) 650 MG CR tablet Take 1,300 mg by mouth every 8 (eight) hours as needed for pain.   Marland Kitchen acetaminophen-codeine (TYLENOL #2) 300-15 MG tablet Take 1 tablet by mouth every 6 (six) hours as needed for moderate pain.    Marland Kitchen AIMSCO INSULIN SYR ULTRA THIN 31G X 5/16" 0.3 ML MISC    . albuterol (VENTOLIN HFA) 108 (90 Base) MCG/ACT inhaler Inhale 2 puffs into the lungs every 4 (four) hours as needed for wheezing or shortness of breath.   . Alcohol Swabs PADS Check blood sugar twice a day and as directed. Dx E11.9   . ALPRAZolam (XANAX) 0.5 MG tablet Take 1 tablet (0.5 mg total) by mouth 3 (three) times daily as needed for anxiety.   Marland Kitchen amitriptyline (ELAVIL) 75 MG tablet TAKE 1 TABLET(75 MG) BY MOUTH AT BEDTIME   . atorvastatin (LIPITOR) 10 MG tablet Take 1 tablet (10 mg total) by mouth daily.   . Blood Glucose Monitoring Suppl (ACCU-CHEK AVIVA PLUS) w/Device KIT Check blood sugar twice daily and as directed.   . budesonide-formoterol (SYMBICORT) 160-4.5 MCG/ACT inhaler INHALE 2 PUFFS INTO THE LUNGS TWICE DAILY   . chlorpheniramine (CHLOR-TRIMETON) 4 MG tablet Take 8 mg by mouth 2 (two) times daily as needed for allergies.   . furosemide (LASIX) 20 MG tablet TAKE 1 TABLET(20  MG) BY MOUTH DAILY   . gemfibrozil (LOPID) 600 MG tablet Take 1 tablet (600 mg total) by mouth 2 (two) times daily before a meal.   . glucose blood (ACCU-CHEK AVIVA PLUS) test strip TEST BLOOD SUGAR TWICE DAILY AND DIRECTED   . Insulin Glargine (LANTUS SOLOSTAR) 100 UNIT/ML Solostar Pen ADMINISTER 35 UNITS UNDER THE SKIN AT BEDTIME (Patient taking differently: Inject 35 Units into the skin at bedtime as needed (if blood sugar is over 250). )   . Insulin Pen Needle (B-D ULTRAFINE III SHORT PEN) 31G X 8 MM MISC USE AS  DIRECTED WITH INSULIN PEN   . Lancet Devices (ADJUSTABLE LANCING DEVICE) MISC Check blood sugar twice a day and as directed. Dx E11.9   . Menthol-Methyl Salicylate (SALONPAS PAIN RELIEF PATCH) PTCH Apply 1 patch topically daily as needed (pain).   . metFORMIN (GLUCOPHAGE) 1000 MG tablet TAKE 1 TABLET(1000 MG) BY MOUTH TWICE DAILY WITH A MEAL   . methocarbamol (ROBAXIN) 500 MG tablet TAKE 2 TABLETS( 1000 MG) BY MOUTH THREE TIMES DAILY AS NEEDED FOR MUSCLE SPASMS, BETWEEN MEALS AND AT BEDTIME   . omeprazole (PRILOSEC OTC) 20 MG tablet Take 20 mg by mouth daily.    Marland Kitchen OVER THE COUNTER MEDICATION Apply 1 application topically daily as needed (pain). Hemp oil   . predniSONE (DELTASONE) 10 MG tablet Take 1 tablet (10 mg total) by mouth daily with breakfast.   . PROAIR HFA 108 (90 Base) MCG/ACT inhaler TAKE 2 PUFFS BY MOUTH EVERY 4 HOURS AS NEEDED FOR WHEEZE OR FOR SHORTNESS OF BREATH   . Vitamin D, Ergocalciferol, (DRISDOL) 1.25 MG (50000 UT) CAPS capsule Take 1 capsule (50,000 Units total) by mouth every 7 (seven) days. 01/01/2020: Sundays    No facility-administered encounter medications on file as of 03/10/2020.   Current Diagnosis/Assessment:   Emergency planning/management officer Strain: Low Risk   . Difficulty of Paying Living Expenses: Not very hard   Goals Addressed   None    Hyperlipidemia   Lipid Panel     Component Value Date/Time   CHOL 134 02/26/2018 1008   TRIG 79.0 02/26/2018 1008   TRIG 105 10/08/2006 0821   HDL 46.60 02/26/2018 1008   CHOLHDL 3 02/26/2018 1008   VLDL 15.8 02/26/2018 1008   LDLCALC 72 02/26/2018 1008   LDLDIRECT 59.4 09/26/2009 1010    The 10-year ASCVD risk score Sandra Bussing DC Jr., et al., 2013) is: 6.9%   Values used to calculate the score:     Age: 20 years     Sex: Female     Is Non-Hispanic African American: No     Diabetic: Yes     Tobacco smoker: No     Systolic Blood Pressure: 546 mmHg     Is BP treated: Yes     HDL Cholesterol: 46.6 mg/dL     Total Cholesterol:  134 mg/dL   LDL goal < 100 Patient has failed these meds in past: none reported Patient is currently controlled on the following medications:   Atorvastatin 10 mg - 1 tablet daily  Gemfibrozil 600 mg - 1 tablet BID before meals  We discussed:r efills timely; has been on Lopid > 10 years, previously unable to tolerate statins; atorvastatin retrial started 09/30/19 (lipid panel not updated); she is tolerating low dose statin; Consider discontinuing gemfibrozil due to drug interaction, increased risk of rhabdomyolysis (per chart review, no history of TG > 500 or pancreatitis)  Plan: Continue current medications; Consult with PCP regarding gemfibrozil.  Hypertension   CMP Latest Ref Rng & Units 01/03/2020 01/01/2020 12/31/2019  Glucose 70 - 99 mg/dL 312(H) 184(H) 147(H)  BUN 6 - 20 mg/dL 12 9 9   Creatinine 0.44 - 1.00 mg/dL 0.62 0.77 0.60  Sodium 135 - 145 mmol/L 137 141 140  Potassium 3.5 - 5.1 mmol/L 4.3 3.3(L) 3.3(L)  Chloride 98 - 111 mmol/L 105 104 101  CO2 22 - 32 mmol/L 23 26 26   Calcium 8.9 - 10.3 mg/dL 8.2(L) 8.0(L) 8.4(L)  Total Protein 6.0 - 8.3 g/dL - - -  Total Bilirubin 0.2 - 1.2 mg/dL - - -  Alkaline Phos 39 - 117 U/L - - -  AST 0 - 37 U/L - - -  ALT 0 - 35 U/L - - -   Office blood pressures are: BP Readings from Last 3 Encounters:  01/03/20 136/63  09/24/19 134/64  07/31/19 91/71   Patient has failed these meds in the past: none reported Patient checks BP at home infrequently, has wrist monitor  Patient home BP readings are ranging: none reported  Patient is currently controlled on the following medications:   Furosemide 20 mg - 1 tablet daily  We discussed:  Confirmed daily adherence, denies swelling   Plan: Continue current medications  COPD / Tobacco   Last spirometry score: unknown  Eosinophil count:   Lab Results  Component Value Date/Time   EOSPCT 1 12/31/2019 11:50 PM  %                               Eos (Absolute):  Lab Results  Component  Value Date/Time   EOSABS 0.1 12/31/2019 11:50 PM   Tobacco Status:  Social History   Tobacco Use  Smoking Status Former Smoker  . Packs/day: 1.00  . Years: 35.00  . Pack years: 35.00  . Types: Cigarettes  . Quit date: 06/2018  . Years since quitting: 1.7  Smokeless Tobacco Never Used   Patient has failed these meds in past: none reported Patient is currently controlled on the following medications:   Symbicort 160-4.5 mcg - 2 puffs in the lungs twice daily  Albuterol 90 mcg - 2 puffs every 4 hours PRN  Allergies: Chlorpheniramine 4 mg - 2 tabs BID PRN   Using maintenance inhaler regularly? Yes Frequency of rescue inhaler use:  1-2x per week  We discussed: reports taking Chlor-trimeton 2 tablets PRN (mainly due to lower cost than other OTC antihistamines, has taken it a long time) - recommended switching to a 2nd gen antihistamine for frequent use.  Plan: Continue current medications; Try switching to Chlor-trimeton to Claritin daily.   Diabetes   Recent Relevant Labs: Lab Results  Component Value Date/Time   HGBA1C 8.2 (H) 01/01/2020 06:02 AM   HGBA1C 6.5 09/24/2019 01:06 PM   MICROALBUR 0.8 02/26/2018 10:08 AM   MICROALBUR 3.1 (H) 09/24/2016 10:02 AM    A1c goal < 7% Checking BG: 2x per Day (recently started using a new glucometer, previous wasn't working)   Recent BG Readings:   03/10/20 9:30 AM - 195, 3:30 AM - 226 (had 3 oreos)  03/09/20 8 AM - 207  03/08/20 8 AM - 181, 11:30 PM - 172  03/07/20 10 AM - 100, 11:30 PM - 183  03/06/20: 10 AM - 127, 11 PM - 141 Denies hypoglycemia Unable to recall last time she took insulin; reports taking insulin as needed to correct high BG (~200s)  Patient  has failed these meds in past: pioglitazone - swelling  Patient is currently uncontrolled on the following medications:   Insulin glargine - 35-40 units PRN bedtime if BG > 250  Metformin 1000 mg - 1 tablet BID with meals  Last diabetic eye exam:  Lab Results  Component  Value Date/Time   HMDIABEYEEXA No Retinopathy 02/21/2017 12:00 AM    Last diabetic foot exam:  Lab Results  Component Value Date/Time   HMDIABFOOTEX done 09/24/2019 12:00 AM    Diet: eating 2-3 oreos frequently at night  Plan: Continue current medications; Work on limiting snacks/oreos. Continue to monitor BG twice daily. Will call to check BG log in 4 weeks.  Heart Failure   Type: Diastolic Last ejection fraction: 2012 - 60-65% NYHA Class: II (slight limitation of activity) AHA HF Stage: C (Heart disease and symptoms present)  Patient has failed these meds in past: none reported Patient is currently controlled on the following medications:   Furosemide 20 mg - 1 tablet daily  We discussed weighing daily; if you gain more than 3 pounds in one day or 5 pounds in one week call your doctor; denies shortness of breath and swelling  Plan: Continue current medications   GERD   Patient has failed these meds in past: none reported Patient is currently controlled on the following medications:   Omeprazole 20 mg OTC - 1 tablet daily  We discussed: reports taking OTC omeprazole daily with other morning medications; works well for acid reflux symptoms   Plan: Continue current medications  Anxiety/Depression   Patient has failed these meds in past: lorazepam  Patient is currently controlled on the following medications:   Alprazolam 0.5 mg - 1 tablet TID PRN anxiety  We discussed: reports taking three times every day, working well  Plan: Continue current medications  Fibromyalgia/Chronic pain   Patient has failed these meds in past: none reported Patient is currently controlled on the following medications:   Amitriptyline 75 mg - 1 tablet qhs   Gabapentin 300 mg - 1 capsule AM, 1 capsule afternoon, 2 capsules bedtime  Salonpas patch - apply once daily PRN (not needed recently)  Methocarbamol 500 mg - 2 tabs TID PRN (taking 2 tabs BID)  --  Osteoarthritis:  Tylenol  650 mg - 1 tablet q8h PRN (usually takes 2 tablets daily)  Tylenol with codeine 300-15 mg - 1 tablet q6h PRN (not taking often)  Plan: Continue current medications   Vaccines   Reviewed and discussed patient's vaccination history.    Immunization History  Administered Date(s) Administered  . Influenza Whole 09/19/2007, 09/06/2008, 09/22/2009, 10/03/2010  . Influenza,inj,Quad PF,6+ Mos 08/28/2013, 09/13/2015, 01/01/2017, 08/30/2017  . Pneumococcal Polysaccharide-23 10/03/2010, 01/01/2017, 05/30/2019  . Td 11/05/2002  . Tdap 06/22/2014  . Zoster 08/07/2015  . Zoster Recombinat (Shingrix) 02/15/2017, 03/14/2018   Plan: Patient is up to date on vaccinations.  Medication Management  Misc: Vitamin D 50,000 IU - weekly, prednisone 10 mg daily - short course, not currently taking   OTCs: none reported  Pharmacy/Benefits:[facility]   Affordability: no concerns   CCM Follow Up: 04/11/20 at 3:30 PM (telephone) for BG log   Debbora Dus, PharmD Clinical Pharmacist [facility] at [facility] [phone]

## 2020-03-10 NOTE — Patient Instructions (Signed)
Mar 10, 2020  Dear Sandra Ferrell,  It was a pleasure meeting you during our initial appointment on Mar 10, 2020. Below is a summary of the goals we discussed and components of chronic care management. Please contact me anytime with questions or concerns.   Visit Information  Goals Addressed            This Visit's Progress   . Pharmacy Care Plan       CARE PLAN ENTRY  Current Barriers:  . Chronic Disease Management support, education, and care coordination needs related to Hyperlipidemia and Diabetes  Hyperlipidemia . Pharmacist Clinical Goal(s): o Over the next 30 days, patient will work with PharmD and providers to maintain LDL goal < 100 . Current regimen:   Atorvastatin 10 mg - 1 tablet daily  Gemfibrozil 600 mg - 1 tablet twice daily before meals . Interventions: o Recommend discontinuing gemfibrozil pending PCP consult  . Patient self care activities - Over the next 30 days, patient will: o Continue current medications as prescribed o  Eat plenty of vegetables, fruits and whole grains. Incorporate low-fat dairy products, poultry, fish, legumes, non-tropical vegetable oils and nuts into diet. Limit intake of sweets, sugar-sweetened beverages and red meats.  Diabetes . Pharmacist Clinical Goal(s): o Over the next 30 days, patient will work with PharmD and providers to achieve A1c goal <7% . Current regimen:  o Metformin 1000 mg - 1 tablet twice daily with meals o Insulin glargine - Inject 35-40 units at bedtime if BG > 250 . Interventions: o Continue current medications; Counseled on limiting sweets.  . Patient self care activities - Over the next 30 days, patient will: o Check blood sugar twice daily, document, and provide at future appointments o Contact provider with any episodes of hypoglycemia o Write down time of day/date when taking insulin, units injected, and blood glucose prior to taking insulin  o Limit sweets  Allergies . Pharmacist Clinical  Goal(s) o Over the next 30 days, patient will work with PharmD and providers to limit inappropriate medication use and improve allergies . Current regimen:  o Chlorpheniramine 4 mg - 2 tabs twice daily as needed  . Interventions: o Recommend switching to Claritin 10 mg daily as a safer alternative with less side effects . Patient self care activities - Over the next 30 days, patient will: o Try Claritin 10 mg daily for allergies in place of chlorpheniramine   Initial goal documentation      [name] was given information about Chronic Care Management services today including:  1. CCM service includes personalized support from designated clinical staff supervised by her physician, including individualized plan of care and coordination with other care providers 2. 24/7 contact phone numbers for assistance for urgent and routine care needs. 3. Standard insurance, coinsurance, copays and deductibles apply for chronic care management only during months in which we provide at least 20 minutes of these services. Most insurances cover these services at 100%, however patients may be responsible for any copay, coinsurance and/or deductible if applicable. This service may help you avoid the need for more expensive face-to-face services. 4. Only one practitioner may furnish and bill the service in a calendar month. 5. The patient may stop CCM services at any time (effective at the end of the month) by phone call to the office staff.  Patient agreed to services and verbal consent obtained.   The patient verbalized understanding of instructions provided today and agreed to receive a mailed copy  of patient instruction and/or educational materials. Telephone follow up appointment with pharmacy team member scheduled for: 04/11/20 at 3:30 PM (telephone) for BG log   Debbora Dus, PharmD Clinical Pharmacist [facility] at [facility] [phone]   Diabetes Mellitus and Nutrition, Adult When  you have diabetes (diabetes mellitus), it is very important to have healthy eating habits because your blood sugar (glucose) levels are greatly affected by what you eat and drink. Eating healthy foods in the appropriate amounts, at about the same times every day, can help you:  Control your blood glucose.  Lower your risk of heart disease.  Improve your blood pressure.  Reach or maintain a healthy weight. Every person with diabetes is different, and each person has different needs for a meal plan. Your health care provider may recommend that you work with a diet and nutrition specialist (dietitian) to make a meal plan that is best for you. Your meal plan may vary depending on factors such as:  The calories you need.  The medicines you take.  Your weight.  Your blood glucose, blood pressure, and cholesterol levels.  Your activity level.  Other health conditions you have, such as heart or kidney disease. How do carbohydrates affect me? Carbohydrates, also called carbs, affect your blood glucose level more than any other type of food. Eating carbs naturally raises the amount of glucose in your blood. Carb counting is a method for keeping track of how many carbs you eat. Counting carbs is important to keep your blood glucose at a healthy level, especially if you use insulin or take certain oral diabetes medicines. It is important to know how many carbs you can safely have in each meal. This is different for every person. Your dietitian can help you calculate how many carbs you should have at each meal and for each snack. Foods that contain carbs include:  Bread, cereal, rice, pasta, and crackers.  Potatoes and corn.  Peas, beans, and lentils.  Milk and yogurt.  Fruit and juice.  Desserts, such as cakes, cookies, ice cream, and candy. How does alcohol affect me? Alcohol can cause a sudden decrease in blood glucose (hypoglycemia), especially if you use insulin or take certain oral  diabetes medicines. Hypoglycemia can be a life-threatening condition. Symptoms of hypoglycemia (sleepiness, dizziness, and confusion) are similar to symptoms of having too much alcohol. If your health care provider says that alcohol is safe for you, follow these guidelines:  Limit alcohol intake to no more than 1 drink per day for nonpregnant women and 2 drinks per day for men. One drink equals 12 oz of beer, 5 oz of wine, or 1 oz of hard liquor.  Do not drink on an empty stomach.  Keep yourself hydrated with water, diet soda, or unsweetened iced tea.  Keep in mind that regular soda, juice, and other mixers may contain a lot of sugar and must be counted as carbs. What are tips for following this plan?  Reading food labels  Start by checking the serving size on the "Nutrition Facts" label of packaged foods and drinks. The amount of calories, carbs, fats, and other nutrients listed on the label is based on one serving of the item. Many items contain more than one serving per package.  Check the total grams (g) of carbs in one serving. You can calculate the number of servings of carbs in one serving by dividing the total carbs by 15. For example, if a food has 30 g of total  carbs, it would be equal to 2 servings of carbs.  Check the number of grams (g) of saturated and trans fats in one serving. Choose foods that have low or no amount of these fats.  Check the number of milligrams (mg) of salt (sodium) in one serving. Most people should limit total sodium intake to less than 2,300 mg per day.  Always check the nutrition information of foods labeled as "low-fat" or "nonfat". These foods may be higher in added sugar or refined carbs and should be avoided.  Talk to your dietitian to identify your daily goals for nutrients listed on the label. Shopping  Avoid buying canned, premade, or processed foods. These foods tend to be high in fat, sodium, and added sugar.  Shop around the outside edge  of the grocery store. This includes fresh fruits and vegetables, bulk grains, fresh meats, and fresh dairy. Cooking  Use low-heat cooking methods, such as baking, instead of high-heat cooking methods like deep frying.  Cook using healthy oils, such as olive, canola, or sunflower oil.  Avoid cooking with butter, cream, or high-fat meats. Meal planning  Eat meals and snacks regularly, preferably at the same times every day. Avoid going long periods of time without eating.  Eat foods high in fiber, such as fresh fruits, vegetables, beans, and whole grains. Talk to your dietitian about how many servings of carbs you can eat at each meal.  Eat 4-6 ounces (oz) of lean protein each day, such as lean meat, chicken, fish, eggs, or tofu. One oz of lean protein is equal to: ? 1 oz of meat, chicken, or fish. ? 1 egg. ?  cup of tofu.  Eat some foods each day that contain healthy fats, such as avocado, nuts, seeds, and fish. Lifestyle  Check your blood glucose regularly.  Exercise regularly as told by your health care provider. This may include: ? 150 minutes of moderate-intensity or vigorous-intensity exercise each week. This could be brisk walking, biking, or water aerobics. ? Stretching and doing strength exercises, such as yoga or weightlifting, at least 2 times a week.  Take medicines as told by your health care provider.  Do not use any products that contain nicotine or tobacco, such as cigarettes and e-cigarettes. If you need help quitting, ask your health care provider.  Work with a Social worker or diabetes educator to identify strategies to manage stress and any emotional and social challenges. Questions to ask a health care provider  Do I need to meet with a diabetes educator?  Do I need to meet with a dietitian?  What number can I call if I have questions?  When are the best times to check my blood glucose? Where to find more information:  American Diabetes Association:  diabetes.org  Academy of Nutrition and Dietetics: www.eatright.[employer] of Diabetes and Digestive and Kidney Diseases (NIH): [employer] Summary  A healthy meal plan will help you control your blood glucose and maintain a healthy lifestyle.  Working with a diet and nutrition specialist (dietitian) can help you make a meal plan that is best for you.  Keep in mind that carbohydrates (carbs) and alcohol have immediate effects on your blood glucose levels. It is important to count carbs and to use alcohol carefully. This information is not intended to replace advice given to you by your health care provider. Make sure you discuss any questions you have with your health care provider. Document Revised: 10/04/2017 Document Reviewed: 11/26/2016 Elsevier Patient Education  [phone]  [employer].

## 2020-03-15 ENCOUNTER — Ambulatory Visit: Payer: Medicare Other | Admitting: Family Medicine

## 2020-03-15 DIAGNOSIS — Z0289 Encounter for other administrative examinations: Secondary | ICD-10-CM

## 2020-03-16 ENCOUNTER — Telehealth: Payer: Self-pay
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
## 2020-03-16 NOTE — Telephone Encounter (Signed)
Thanks you for speaking with APS. I would be happy to speak with them if the have questions today. I can call them when in office tomorrow.

## 2020-03-21 ENCOUNTER — Other Ambulatory Visit: Payer: Self-pay | Admitting: Family Medicine

## 2020-03-22 NOTE — Telephone Encounter (Signed)
Last office visit 02/18/2020 for hospital follow up.  Last refilled 02/17/2020 for #90 with no refills. No future appointments with PCP.

## 2020-03-24 NOTE — Assessment & Plan Note (Signed)
Complete prednisone. Use albuterol as needed for wheeze.

## 2020-03-24 NOTE — Assessment & Plan Note (Signed)
Start clartin or Zyrtec daily for allergies.

## 2020-03-25 ENCOUNTER — Other Ambulatory Visit: Payer: Self-pay | Admitting: Family Medicine

## 2020-03-25 NOTE — Telephone Encounter (Signed)
Last office visit 02/18/2020 for hospital follow up.  Last refilled 01/22/2020 for #60 with 2 refills.  No future appointments with PCP.

## 2020-03-26 DIAGNOSIS — S72492S Other fracture of lower end of left femur, sequela: Secondary | ICD-10-CM | POA: Diagnosis not present

## 2020-03-26 DIAGNOSIS — S72142D Displaced intertrochanteric fracture of left femur, subsequent encounter for closed fracture with routine healing: Secondary | ICD-10-CM | POA: Diagnosis not present

## 2020-04-03 DIAGNOSIS — S72492S Other fracture of lower end of left femur, sequela: Secondary | ICD-10-CM | POA: Diagnosis not present

## 2020-04-03 DIAGNOSIS — S72142D Displaced intertrochanteric fracture of left femur, subsequent encounter for closed fracture with routine healing: Secondary | ICD-10-CM | POA: Diagnosis not present

## 2020-04-03 DIAGNOSIS — S72452K Displaced supracondylar fracture without intracondylar extension of lower end of left femur, subsequent encounter for closed fracture with nonunion: Secondary | ICD-10-CM | POA: Diagnosis not present

## 2020-04-05 ENCOUNTER — Other Ambulatory Visit: Payer: Self-pay | Admitting: Family Medicine

## 2020-04-05 NOTE — Telephone Encounter (Signed)
Last office visit 02/18/2020 for hospital follow up.  Last refilled 03/04/2020 for #120 with no refills.  No future appointments with PCP.

## 2020-04-11 ENCOUNTER — Telehealth: Payer: Medicare Other

## 2020-04-25 ENCOUNTER — Telehealth: Payer: Self-pay

## 2020-04-25 NOTE — Telephone Encounter (Signed)
Spoke to pt's husband per DPR. He said she was scheduled to see Dr Katy Fitch and had to cancel. He will remind her to reschedule.

## 2020-04-26 ENCOUNTER — Other Ambulatory Visit: Payer: Self-pay | Admitting: Family Medicine

## 2020-04-26 DIAGNOSIS — S72142D Displaced intertrochanteric fracture of left femur, subsequent encounter for closed fracture with routine healing: Secondary | ICD-10-CM | POA: Diagnosis not present

## 2020-04-26 DIAGNOSIS — S72492S Other fracture of lower end of left femur, sequela: Secondary | ICD-10-CM | POA: Diagnosis not present

## 2020-04-26 NOTE — Telephone Encounter (Signed)
Last office visit 02/18/2020 for hospital follow up.  Last refilled 03/22/2020 for #90 with no refills.  No future appointments.

## 2020-05-02 ENCOUNTER — Other Ambulatory Visit: Payer: Self-pay | Admitting: *Deleted

## 2020-05-02 DIAGNOSIS — E119 Type 2 diabetes mellitus without complications: Secondary | ICD-10-CM

## 2020-05-02 MED ORDER — ACCU-CHEK GUIDE VI STRP: ORAL_STRIP | 11 refills | Status: AC

## 2020-05-03 ENCOUNTER — Other Ambulatory Visit: Payer: Self-pay | Admitting: Family Medicine

## 2020-05-03 NOTE — Telephone Encounter (Signed)
Last office visit 02/18/2020 for hospital follow up.  Last refilled 04/05/2020 for #120 with no refills.  No future appointments.

## 2020-05-04 DIAGNOSIS — S72142D Displaced intertrochanteric fracture of left femur, subsequent encounter for closed fracture with routine healing: Secondary | ICD-10-CM | POA: Diagnosis not present

## 2020-05-04 DIAGNOSIS — S72492S Other fracture of lower end of left femur, sequela: Secondary | ICD-10-CM | POA: Diagnosis not present

## 2020-05-04 DIAGNOSIS — S72452K Displaced supracondylar fracture without intracondylar extension of lower end of left femur, subsequent encounter for closed fracture with nonunion: Secondary | ICD-10-CM | POA: Diagnosis not present

## 2020-05-10 ENCOUNTER — Emergency Department (HOSPITAL_COMMUNITY)
Admission: EM | Admit: 2020-05-10 | Discharge: 2020-05-14 | Disposition: A | Discharge status: Skilled Nursing Facility | Payer: Medicare Other | Attending: Emergency Medicine | Admitting: Emergency Medicine

## 2020-05-10 ENCOUNTER — Encounter (HOSPITAL_COMMUNITY): Payer: Self-pay

## 2020-05-10 ENCOUNTER — Other Ambulatory Visit: Payer: Self-pay

## 2020-05-10 DIAGNOSIS — Z794 Long term (current) use of insulin: Secondary | ICD-10-CM | POA: Diagnosis not present

## 2020-05-10 DIAGNOSIS — R109 Unspecified abdominal pain: Secondary | ICD-10-CM | POA: Diagnosis not present

## 2020-05-10 DIAGNOSIS — M24562 Contracture, left knee: Secondary | ICD-10-CM | POA: Diagnosis not present

## 2020-05-10 DIAGNOSIS — R404 Transient alteration of awareness: Secondary | ICD-10-CM | POA: Diagnosis not present

## 2020-05-10 DIAGNOSIS — Z20822 Contact with and (suspected) exposure to covid-19: Secondary | ICD-10-CM | POA: Insufficient documentation

## 2020-05-10 DIAGNOSIS — I110 Hypertensive heart disease with heart failure: Secondary | ICD-10-CM | POA: Insufficient documentation

## 2020-05-10 DIAGNOSIS — R531 Weakness: Secondary | ICD-10-CM | POA: Diagnosis not present

## 2020-05-10 DIAGNOSIS — I509 Heart failure, unspecified: Secondary | ICD-10-CM | POA: Insufficient documentation

## 2020-05-10 DIAGNOSIS — E119 Type 2 diabetes mellitus without complications: Secondary | ICD-10-CM | POA: Diagnosis not present

## 2020-05-10 DIAGNOSIS — J449 Chronic obstructive pulmonary disease, unspecified: Secondary | ICD-10-CM | POA: Diagnosis not present

## 2020-05-10 DIAGNOSIS — R1030 Lower abdominal pain, unspecified: Secondary | ICD-10-CM | POA: Diagnosis not present

## 2020-05-10 DIAGNOSIS — R300 Dysuria: Secondary | ICD-10-CM | POA: Insufficient documentation

## 2020-05-10 DIAGNOSIS — Z79899 Other long term (current) drug therapy: Secondary | ICD-10-CM | POA: Insufficient documentation

## 2020-05-10 DIAGNOSIS — N390 Urinary tract infection, site not specified: Secondary | ICD-10-CM

## 2020-05-10 DIAGNOSIS — Z87891 Personal history of nicotine dependence: Secondary | ICD-10-CM | POA: Diagnosis not present

## 2020-05-10 DIAGNOSIS — S72452K Displaced supracondylar fracture without intracondylar extension of lower end of left femur, subsequent encounter for closed fracture with nonunion: Secondary | ICD-10-CM | POA: Diagnosis not present

## 2020-05-10 DIAGNOSIS — E1165 Type 2 diabetes mellitus with hyperglycemia: Secondary | ICD-10-CM | POA: Diagnosis not present

## 2020-05-10 DIAGNOSIS — Z743 Need for continuous supervision: Secondary | ICD-10-CM | POA: Diagnosis not present

## 2020-05-10 DIAGNOSIS — Z03818 Encounter for observation for suspected exposure to other biological agents ruled out: Secondary | ICD-10-CM | POA: Diagnosis not present

## 2020-05-10 LAB — URINALYSIS, ROUTINE W REFLEX MICROSCOPIC
Bilirubin Urine: NEGATIVE
Glucose, UA: 50 mg/dL — AB
Ketones, ur: NEGATIVE mg/dL
Nitrite: POSITIVE — AB
Protein, ur: 100 mg/dL — AB
Specific Gravity, Urine: 1.025 (ref 1.005–1.030)
WBC, UA: >50 WBC/hpf — ABNORMAL HIGH (ref 0–5)
pH: 5 (ref 5.0–8.0)

## 2020-05-10 LAB — SARS CORONAVIRUS 2 BY RT PCR (HOSPITAL ORDER, PERFORMED IN ~~LOC~~ HOSPITAL LAB): SARS Coronavirus 2: NEGATIVE

## 2020-05-10 MED ORDER — FUROSEMIDE 40 MG PO TABS
20.0000 mg | ORAL_TABLET | Freq: Every day | ORAL | Status: DC
  Administered 2020-05-10 – 2020-05-14 (×5): 20 mg via ORAL
  Filled 2020-05-10 (×7): qty 1

## 2020-05-10 MED ORDER — METFORMIN HCL 500 MG PO TABS
1000.0000 mg | ORAL_TABLET | Freq: Two times a day (BID) | ORAL | Status: DC
  Administered 2020-05-11 – 2020-05-14 (×7): 1000 mg via ORAL
  Filled 2020-05-10 (×7): qty 2

## 2020-05-10 MED ORDER — AMITRIPTYLINE HCL 25 MG PO TABS
75.0000 mg | ORAL_TABLET | Freq: Every day | ORAL | Status: DC
  Administered 2020-05-10 – 2020-05-13 (×4): 75 mg via ORAL
  Filled 2020-05-10 (×4): qty 3

## 2020-05-10 MED ORDER — CEPHALEXIN 500 MG PO CAPS
500.0000 mg | ORAL_CAPSULE | Freq: Three times a day (TID) | ORAL | Status: DC
  Administered 2020-05-10 – 2020-05-14 (×11): 500 mg via ORAL
  Filled 2020-05-10 (×11): qty 1

## 2020-05-10 MED ORDER — ALBUTEROL SULFATE HFA 108 (90 BASE) MCG/ACT IN AERS
2.0000 | INHALATION_SPRAY | Freq: Four times a day (QID) | RESPIRATORY_TRACT | Status: DC | PRN
  Filled 2020-05-10: qty 6.7

## 2020-05-10 MED ORDER — METHOCARBAMOL 500 MG PO TABS
500.0000 mg | ORAL_TABLET | Freq: Four times a day (QID) | ORAL | Status: DC | PRN
  Administered 2020-05-12 – 2020-05-13 (×2): 500 mg via ORAL
  Filled 2020-05-10 (×3): qty 1

## 2020-05-10 MED ORDER — VITAMIN D (ERGOCALCIFEROL) 1.25 MG (50000 UNIT) PO CAPS: 50000.0000 [IU] | ORAL_CAPSULE | ORAL | Status: DC

## 2020-05-10 MED ORDER — ALPRAZOLAM 0.5 MG PO TABS
0.5000 mg | ORAL_TABLET | Freq: Three times a day (TID) | ORAL | Status: DC
  Administered 2020-05-10 – 2020-05-14 (×11): 0.5 mg via ORAL
  Filled 2020-05-10 (×11): qty 1

## 2020-05-10 MED ORDER — GABAPENTIN 300 MG PO CAPS
300.0000 mg | ORAL_CAPSULE | Freq: Two times a day (BID) | ORAL | Status: DC
  Administered 2020-05-10 – 2020-05-14 (×8): 300 mg via ORAL
  Filled 2020-05-10 (×8): qty 1

## 2020-05-10 MED ORDER — ATORVASTATIN CALCIUM 10 MG PO TABS
10.0000 mg | ORAL_TABLET | Freq: Every day | ORAL | Status: DC
  Administered 2020-05-11 – 2020-05-14 (×5): 10 mg via ORAL
  Filled 2020-05-10 (×5): qty 1

## 2020-05-10 MED ORDER — GEMFIBROZIL 600 MG PO TABS
600.0000 mg | ORAL_TABLET | Freq: Two times a day (BID) | ORAL | Status: DC
  Administered 2020-05-11 – 2020-05-14 (×7): 600 mg via ORAL
  Filled 2020-05-10 (×11): qty 1

## 2020-05-10 MED ORDER — INSULIN GLARGINE 100 UNIT/ML ~~LOC~~ SOLN
35.0000 [IU] | Freq: Every evening | SUBCUTANEOUS | Status: DC | PRN
  Administered 2020-05-13: 35 [IU] via SUBCUTANEOUS
  Filled 2020-05-10 (×4): qty 0.35

## 2020-05-10 MED ORDER — ACETAMINOPHEN 325 MG PO TABS
650.0000 mg | ORAL_TABLET | Freq: Three times a day (TID) | ORAL | Status: DC
  Administered 2020-05-11 – 2020-05-14 (×11): 650 mg via ORAL
  Filled 2020-05-10 (×11): qty 2

## 2020-05-10 NOTE — Progress Notes (Signed)
Consult request has been received. CSW attempting to follow up at present time  CSW will continue to follow for D/C needs.  Wynn Alldredge F. Caymen Dubray  MSW, LCSW, LCAS, CSI Transitions of Care Clinical Social Worker Care Coordination Department Ph: [phone]      

## 2020-05-10 NOTE — Progress Notes (Signed)
Per chart review pt has a PASRR:  [id number]  CSW will continue to follow for D/C needs.  Sandra Ferrell  MSW, LCSW, LCAS, CCS Transitions of Care Clinical Social Worker Care Coordination Department Ph: [phone]
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
## 2020-05-10 NOTE — ED Notes (Signed)
Assisted pt with toileting with use of the steady. Pt has difficulty with upper body strength and needs assistance with standing
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
## 2020-05-10 NOTE — Progress Notes (Signed)
Assessment completed/signed FL-2 w/referrals sent out to [facility], PT and COVID tests ordered.  CSW will continue to follow for D/C needs.  Alphonse Guild. Izzah Pasqua  MSW, LCSW, LCAS, CCS Transitions of Care Clinical Social Worker Care Coordination Department Ph: [phone]
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
## 2020-05-10 NOTE — Progress Notes (Signed)
[place] [facility] has one confirmed opening and shelter staff are standing by to complete a telephone assessment with the pt now if pt desires a bed.  CSW to go to pt now.  CSW will continue to follow for D/C needs.  Alphonse Guild. Lillian Tigges  MSW, LCSW, LCAS, CCS Transitions of Care Clinical Social Worker Care Coordination Department Ph: [phone]

## 2020-05-10 NOTE — ED Notes (Signed)
Environmental manager at pt bedside

## 2020-05-11 LAB — CBG MONITORING, ED: Glucose-Capillary: 212 mg/dL — ABNORMAL HIGH (ref 70–99)

## 2020-05-11 NOTE — Social Work (Signed)
CSW discussed with pt her choice of SNF's.  CSW will began the acceptance process with her chosen SNF.  Sandra Ferrell, MSW, [place] ED Transitions of Engineer, building services Health [phone]

## 2020-05-11 NOTE — ED Notes (Signed)
Pt in room. Denies any needs at this time. Hooked up to monitor by EMT. Pt concerned about home meds. Informed that meds will be administered at scheduled times.

## 2020-05-11 NOTE — Social Work (Signed)
CSW following up at present time.  Gave pt MCR list for her to decide where she wants to go.  CSW also made pt aware of the SNF's that have accepted her at this time.   CSW will continue to follow for D/C needs.  Jeromiah Ohalloran Tarpley-Carter, MSW, [place] ED Transitions of Engineer, building services Health [phone]

## 2020-05-11 NOTE — Social Work (Signed)
CSW began authorization for SNF placement with [place] [phone].  CSW will continue to follow for dc needs.  Sandra Ferrell, MSW, [place] ED Transitions of Care Clinical Social Worker [place] [phone]

## 2020-05-11 NOTE — ED Notes (Signed)
Pt given crackers and peanut butter.
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
## 2020-05-12 NOTE — ED Notes (Signed)
Patient is resting comfortably. 

## 2020-05-12 NOTE — ED Notes (Signed)
Patient given dinner tray.

## 2020-05-12 NOTE — ED Notes (Signed)
New purewick applied to patient. Gown changed. Patient brushed teeth and combed hair. Provided with warm blankets.

## 2020-05-12 NOTE — ED Notes (Signed)
Gave patient a breakfast tray  

## 2020-05-12 NOTE — Social Work (Signed)
CSW checked for completion of authorization with [place] [phone].  [facility] verified that clinicals have been received, they are waiting for a clinician to review.  CSW will continue to follow for dc needs.  Jamicheal Heard Tarpley-Carter, MSW, [place] ED Transitions of Engineer, building services Health [phone]

## 2020-05-12 NOTE — ED Notes (Signed)
Pt resting in bed watching TV. Pt does not verbalize any complaints at this time.

## 2020-05-12 NOTE — ED Notes (Signed)
Patient's bed pad changed.

## 2020-05-13 LAB — URINE CULTURE: Culture: >=100000 — AB

## 2020-05-13 LAB — CBG MONITORING, ED
Glucose-Capillary: 334 mg/dL — ABNORMAL HIGH (ref 70–99)
Glucose-Capillary: 350 mg/dL — ABNORMAL HIGH (ref 70–99)

## 2020-05-13 NOTE — Social Work (Signed)
CSW faxed Covid results and PT evaluation to [facility] [phone].    CSW will continue to follow for dc needs.  Verniece Encarnacion Tarpley-Carter, MSW, [place] ED Transitions of Engineer, building services Health [phone]

## 2020-05-13 NOTE — Social Work (Addendum)
CSW will continue to follow pt until dc'd.  Currently, awaiting:  Authorization from [facility]   [employer], MSW, [place] ED Transitions of [place] [phone]
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
## 2020-05-13 NOTE — ED Provider Notes (Signed)
°  Physical Exam  BP 129/65 (BP Location: Left Arm)    Pulse (!) 108    Temp 97.7 F (36.5 C) (Oral)    Resp 19    Ht 4\' 10"  (1.473 m)    Wt 104.3 kg    SpO2 94%    BMI 48.07 kg/m   Physical Exam  ED Course/Procedures     Procedures  MDM  Patient still pending placement.       Davonna Belling, MD 05/13/20 1330

## 2020-05-14 DIAGNOSIS — J449 Chronic obstructive pulmonary disease, unspecified: Secondary | ICD-10-CM | POA: Diagnosis not present

## 2020-05-14 DIAGNOSIS — E1169 Type 2 diabetes mellitus with other specified complication: Secondary | ICD-10-CM | POA: Diagnosis not present

## 2020-05-14 DIAGNOSIS — M797 Fibromyalgia: Secondary | ICD-10-CM | POA: Diagnosis not present

## 2020-05-14 DIAGNOSIS — Z7401 Bed confinement status: Secondary | ICD-10-CM | POA: Diagnosis not present

## 2020-05-14 DIAGNOSIS — Z87891 Personal history of nicotine dependence: Secondary | ICD-10-CM | POA: Diagnosis not present

## 2020-05-14 DIAGNOSIS — E119 Type 2 diabetes mellitus without complications: Secondary | ICD-10-CM | POA: Diagnosis not present

## 2020-05-14 DIAGNOSIS — I5030 Unspecified diastolic (congestive) heart failure: Secondary | ICD-10-CM | POA: Diagnosis not present

## 2020-05-14 DIAGNOSIS — K219 Gastro-esophageal reflux disease without esophagitis: Secondary | ICD-10-CM | POA: Diagnosis not present

## 2020-05-14 DIAGNOSIS — Z20822 Contact with and (suspected) exposure to covid-19: Secondary | ICD-10-CM | POA: Diagnosis not present

## 2020-05-14 DIAGNOSIS — I110 Hypertensive heart disease with heart failure: Secondary | ICD-10-CM | POA: Diagnosis not present

## 2020-05-14 DIAGNOSIS — Z743 Need for continuous supervision: Secondary | ICD-10-CM | POA: Diagnosis not present

## 2020-05-14 DIAGNOSIS — S72492S Other fracture of lower end of left femur, sequela: Secondary | ICD-10-CM | POA: Diagnosis not present

## 2020-05-14 DIAGNOSIS — R300 Dysuria: Secondary | ICD-10-CM | POA: Diagnosis not present

## 2020-05-14 DIAGNOSIS — R262 Difficulty in walking, not elsewhere classified: Secondary | ICD-10-CM | POA: Diagnosis not present

## 2020-05-14 DIAGNOSIS — Z79899 Other long term (current) drug therapy: Secondary | ICD-10-CM | POA: Diagnosis not present

## 2020-05-14 DIAGNOSIS — E559 Vitamin D deficiency, unspecified: Secondary | ICD-10-CM | POA: Diagnosis not present

## 2020-05-14 DIAGNOSIS — E785 Hyperlipidemia, unspecified: Secondary | ICD-10-CM | POA: Diagnosis not present

## 2020-05-14 DIAGNOSIS — M2550 Pain in unspecified joint: Secondary | ICD-10-CM | POA: Diagnosis not present

## 2020-05-14 DIAGNOSIS — I509 Heart failure, unspecified: Secondary | ICD-10-CM | POA: Diagnosis not present

## 2020-05-14 DIAGNOSIS — N390 Urinary tract infection, site not specified: Secondary | ICD-10-CM | POA: Diagnosis not present

## 2020-05-14 DIAGNOSIS — M6281 Muscle weakness (generalized): Secondary | ICD-10-CM | POA: Diagnosis not present

## 2020-05-14 DIAGNOSIS — I10 Essential (primary) hypertension: Secondary | ICD-10-CM | POA: Diagnosis not present

## 2020-05-14 DIAGNOSIS — D649 Anemia, unspecified: Secondary | ICD-10-CM | POA: Diagnosis not present

## 2020-05-14 DIAGNOSIS — S72142D Displaced intertrochanteric fracture of left femur, subsequent encounter for closed fracture with routine healing: Secondary | ICD-10-CM | POA: Diagnosis not present

## 2020-05-14 DIAGNOSIS — Z794 Long term (current) use of insulin: Secondary | ICD-10-CM | POA: Diagnosis not present

## 2020-05-14 DIAGNOSIS — R531 Weakness: Secondary | ICD-10-CM | POA: Diagnosis not present

## 2020-05-14 LAB — SARS CORONAVIRUS 2 BY RT PCR (HOSPITAL ORDER, PERFORMED IN ~~LOC~~ HOSPITAL LAB): SARS Coronavirus 2: NEGATIVE

## 2020-05-14 MED ORDER — PANTOPRAZOLE SODIUM 40 MG PO TBEC
40.0000 mg | DELAYED_RELEASE_TABLET | Freq: Every day | ORAL | Status: DC
  Administered 2020-05-14: 40 mg via ORAL
  Filled 2020-05-14: qty 1

## 2020-05-14 MED ORDER — CEPHALEXIN 500 MG PO CAPS
500.0000 mg | ORAL_CAPSULE | Freq: Three times a day (TID) | ORAL | 0 refills | Status: AC
Start: 2020-05-14 — End: 2020-05-19
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
## 2020-05-14 NOTE — Discharge Instructions (Signed)
Sandra Ferrell will need to continue taking keflex, 500mg  three times daily through 05/19/20

## 2020-05-14 NOTE — ED Notes (Signed)
Called PTAR 
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
## 2020-05-16 NOTE — Progress Notes (Signed)
05/16/20  CSW received a call from [employer] with pt's auth #:  [id number]  [place] Coordinator: Barb Merino  Approved for July 9th, 2021 for 5 days.  Faxes can be sent to [facility] at fax: [phone].  Dee at [facility] SN[facility] updated.  Per [facility], pt went to [facility] on 05/14/20 and was still at the facility today on 05/16/20.  Please reconsult if future social work needs arise.  CSW signing off, as social work intervention is no longer needed.  Alphonse Guild. Siriah Treat  MSW, LCSW, LCAS, CCS Transitions of Care Clinical Social Worker Care Coordination Department Ph: [phone]

## 2020-05-19 DIAGNOSIS — N390 Urinary tract infection, site not specified: Secondary | ICD-10-CM | POA: Diagnosis not present

## 2020-05-19 DIAGNOSIS — E1169 Type 2 diabetes mellitus with other specified complication: Secondary | ICD-10-CM | POA: Diagnosis not present

## 2020-05-19 DIAGNOSIS — R262 Difficulty in walking, not elsewhere classified: Secondary | ICD-10-CM | POA: Diagnosis not present

## 2020-05-26 DIAGNOSIS — S72492S Other fracture of lower end of left femur, sequela: Secondary | ICD-10-CM | POA: Diagnosis not present

## 2020-05-26 DIAGNOSIS — S72142D Displaced intertrochanteric fracture of left femur, subsequent encounter for closed fracture with routine healing: Secondary | ICD-10-CM | POA: Diagnosis not present

## 2020-05-30 DIAGNOSIS — I10 Essential (primary) hypertension: Secondary | ICD-10-CM | POA: Diagnosis not present

## 2020-05-30 DIAGNOSIS — E785 Hyperlipidemia, unspecified: Secondary | ICD-10-CM | POA: Diagnosis not present

## 2020-05-30 DIAGNOSIS — I5030 Unspecified diastolic (congestive) heart failure: Secondary | ICD-10-CM | POA: Diagnosis not present

## 2020-05-30 DIAGNOSIS — J449 Chronic obstructive pulmonary disease, unspecified: Secondary | ICD-10-CM | POA: Diagnosis not present

## 2020-05-30 DIAGNOSIS — E119 Type 2 diabetes mellitus without complications: Secondary | ICD-10-CM | POA: Diagnosis not present

## 2020-06-02 ENCOUNTER — Other Ambulatory Visit: Payer: Self-pay | Admitting: Family Medicine

## 2020-06-02 NOTE — Telephone Encounter (Signed)
Last office visit 02/18/2020 for hospital follow up.  Last refilled 11/20/2019 for #30 with 5 refills.  No future appointments.

## 2020-06-03 ENCOUNTER — Telehealth: Payer: Self-pay | Admitting: Family Medicine

## 2020-06-03 ENCOUNTER — Other Ambulatory Visit: Payer: Self-pay | Admitting: Family Medicine

## 2020-06-03 DIAGNOSIS — S72492S Other fracture of lower end of left femur, sequela: Secondary | ICD-10-CM | POA: Diagnosis not present

## 2020-06-03 DIAGNOSIS — S72142D Displaced intertrochanteric fracture of left femur, subsequent encounter for closed fracture with routine healing: Secondary | ICD-10-CM | POA: Diagnosis not present

## 2020-06-03 DIAGNOSIS — S72452K Displaced supracondylar fracture without intracondylar extension of lower end of left femur, subsequent encounter for closed fracture with nonunion: Secondary | ICD-10-CM | POA: Diagnosis not present

## 2020-06-03 MED ORDER — SULFAMETHOXAZOLE-TRIMETHOPRIM 800-160 MG PO TABS: 1.0000 | ORAL_TABLET | Freq: Two times a day (BID) | ORAL | 0 refills | Status: DC

## 2020-06-03 NOTE — Telephone Encounter (Signed)
Patient called in stating she is having a UTI, but was in a rehab facility until yesterday. Patient's culture came back stating she was positive, however the facility she stayed at stated she would have to call her PCP to see if they can give her an antibiotic as they are no longer caring for her. Please advise if possible. Patient stated the results should be faxed over.

## 2020-06-03 NOTE — Telephone Encounter (Signed)
I do not see that we have received fax with urine culture results.  Results are not on the fax machine or in Dr. Rometta Emery office in box.

## 2020-06-03 NOTE — Telephone Encounter (Signed)
Sandra Ferrell can you help? I cannot treat until I have results from rehab. Please call rehab and find out culture and sensitivities ASAP.  Looks like she was treated in hospital for UTI with kefelx.. this is not what she is talking about is it?

## 2020-06-03 NOTE — Progress Notes (Signed)
Call  Ecoli UTI Rx for bactrim sent to [facility]. Will await sensitivities that were still pending.

## 2020-06-03 NOTE — Telephone Encounter (Signed)
Pt reports she was told by a nurse to contact her PCP  For abx and the she was told that [place] and NP, was going to send in abx.  Contacted [facility] and spoke with pharmacist and they have not received any abx.  [facility], [phone], and no answered at nurse line. Called back and asked again the need to speak with nurse. Skyler, title unknown answered, and said she would call [facility] and fax results, asked for fax number, then hung up.   Contacted [facility] and requested lab result. They will fax  Received result and gave to Dr. [name]

## 2020-06-03 NOTE — Telephone Encounter (Signed)
Advised pt of abx and if symptoms persist after finished to contact office. Pt also reports rehab set her up with [facility] and she wants [facility] and they said she had to take what she could get. She also said she has still been getting dizzy when lying down for over a month and she thinks it may be her BP. Advised she should have OV. Made apt for Thursday and gave ER precautions. Pt verbalized understanding.

## 2020-06-08 MED ORDER — CEPHALEXIN 500 MG PO CAPS
500.0000 mg | ORAL_CAPSULE | Freq: Three times a day (TID) | ORAL | 0 refills | Status: DC
Start: 2020-06-08 — End: 2020-07-05

## 2020-06-08 NOTE — Telephone Encounter (Addendum)
Sensitivities were in your office in box yesterday.  I think you signed them and they have been sent for scanning but I have ask Terri to try and pull results again from [place] sent to [facility] as instructed by Dr. [name].

## 2020-06-08 NOTE — Telephone Encounter (Addendum)
Pt notified as instructed that abx is being sent to [facility] now. Pt voiced understanding and appreciative.

## 2020-06-08 NOTE — Telephone Encounter (Signed)
Call in keflex 500 mg TID #21,  0 refills. Call rehab again.culture showing Ecoli , sensitivities were pending 06/03/20.Marland Kitchenplease request if sensitivities back now.

## 2020-06-08 NOTE — Telephone Encounter (Signed)
I spoke with pt and she said she has had UTI on and off since April when pt was in facility. Pt said she does not want to go to UC and will wait for cb. Pt said she is really hurting. I advised that Dr [name] is out of office today but will be back in office on 06/09/20.

## 2020-06-08 NOTE — Addendum Note (Signed)
Addended by: Carter Kitten on: 06/08/2020 03:37 PM   Modules accepted: Orders

## 2020-06-08 NOTE — Telephone Encounter (Signed)
Yes, the sensitivities were faxed on Monday and I put them in your in box.  Terri just printed the results again.  It is sensitive to everything accept NR on Cefazolin.  I will put these results in your office in box to review on Thursday.

## 2020-06-08 NOTE — Telephone Encounter (Signed)
Pt said finished abx on 06/06/20 and pt did not see improvement of urinary symptoms when finished abx.pt is still hurting in lower abd over bladder; pt cannot describe if sharp or dull pain; pt said "it just hurts". Pain level now is 7. Pt also has pain across both sides of lower back. Pt continues with burning, pain, frequency and urgency of urine. Pt said is voiding usual amt of urine. No fever and no blood seen in urine.Pt has no covid symptoms, no travel and no known exposure to + covid.   Pt has not had covid vaccine. Pt is drinking a lot of water and eating normally. Pt is taking  OTC AZO and that is helping a little bit with symptoms. Pt request abx to [facility]. Pt was given Bactrim DS # 6 taking one tab bid on 06/03/20. Pt request more abx. Pt already has appt scheduled in office on 06/09/20 at 3:20 for other issues but pt does not want to wait until then for abx because pt is hurting. Please advise.

## 2020-06-08 NOTE — Telephone Encounter (Signed)
Pt left v/m that she has not gotten cb from Dr Rometta Emery office and pt said UTI and yeast infection are bad and pt cannot wait until tomorrow when seeing Dr [name] to get abx.Pt said "she cannot wait until tomorrow and wants cb today and pt needs answers now."

## 2020-06-08 NOTE — Telephone Encounter (Signed)
I saw the UA and culture showing ECOLI  that you had requested on Friday when I prescribed bactrim... but I did not see sensitivities listed.. was this a second result?

## 2020-06-09 ENCOUNTER — Telehealth: Payer: Self-pay | Admitting: Family Medicine

## 2020-06-09 ENCOUNTER — Ambulatory Visit: Payer: Medicare Other | Admitting: Family Medicine

## 2020-06-09 NOTE — Telephone Encounter (Signed)
Noted  

## 2020-06-09 NOTE — Telephone Encounter (Signed)
Thanks. Reviewed and keflex appropriate.

## 2020-06-09 NOTE — Telephone Encounter (Signed)
Jenny Reichmann called in to notify PCP that they have been unable to get in contact with patient to continue her plan of care. Please advise.

## 2020-06-19 ENCOUNTER — Other Ambulatory Visit: Payer: Self-pay | Admitting: Family Medicine

## 2020-06-20 ENCOUNTER — Other Ambulatory Visit: Payer: Self-pay | Admitting: *Deleted

## 2020-06-20 NOTE — Telephone Encounter (Signed)
Last office visit 02/18/2020 for hospital follow up.  Last refilled alprazolam 04/27/2020 for #90 with no refills.  Methocarbamol 03/25/2020 for #60 with 2 refills.  Next Appt: 06/24/2020.

## 2020-06-20 NOTE — Telephone Encounter (Signed)
Patient called stating that she was in rehab and is out now. Patient stated that Dr. [name] needs to take over her refills now. Patient stated that she is out of two of her medications Alprazolam last refill 04/27/20 #90 Methocarbamol last refill 03/25/20 #60/2 Last office visit 12/02/19 Upcoming appointment 06/24/20

## 2020-06-24 ENCOUNTER — Ambulatory Visit: Payer: Medicare Other | Admitting: Family Medicine

## 2020-06-26 DIAGNOSIS — S72142D Displaced intertrochanteric fracture of left femur, subsequent encounter for closed fracture with routine healing: Secondary | ICD-10-CM | POA: Diagnosis not present

## 2020-06-26 DIAGNOSIS — S72492S Other fracture of lower end of left femur, sequela: Secondary | ICD-10-CM | POA: Diagnosis not present

## 2020-07-01 ENCOUNTER — Encounter: Payer: Self-pay | Admitting: Family Medicine

## 2020-07-01 ENCOUNTER — Other Ambulatory Visit: Payer: Self-pay

## 2020-07-01 ENCOUNTER — Ambulatory Visit (INDEPENDENT_AMBULATORY_CARE_PROVIDER_SITE_OTHER): Payer: Medicare Other | Admitting: Family Medicine

## 2020-07-01 VITALS — BP 128/64 | HR 106 | Temp 97.6°F | Ht <= 58 in

## 2020-07-01 DIAGNOSIS — T7491XD Unspecified adult maltreatment, confirmed, subsequent encounter: Secondary | ICD-10-CM

## 2020-07-01 DIAGNOSIS — I10 Essential (primary) hypertension: Secondary | ICD-10-CM

## 2020-07-01 DIAGNOSIS — E1169 Type 2 diabetes mellitus with other specified complication: Secondary | ICD-10-CM | POA: Diagnosis not present

## 2020-07-01 DIAGNOSIS — R1030 Lower abdominal pain, unspecified: Secondary | ICD-10-CM | POA: Insufficient documentation

## 2020-07-01 DIAGNOSIS — H8110 Benign paroxysmal vertigo, unspecified ear: Secondary | ICD-10-CM

## 2020-07-01 DIAGNOSIS — N390 Urinary tract infection, site not specified: Secondary | ICD-10-CM | POA: Diagnosis not present

## 2020-07-01 DIAGNOSIS — E1159 Type 2 diabetes mellitus with other circulatory complications: Secondary | ICD-10-CM

## 2020-07-01 DIAGNOSIS — E785 Hyperlipidemia, unspecified: Secondary | ICD-10-CM

## 2020-07-01 DIAGNOSIS — J449 Chronic obstructive pulmonary disease, unspecified: Secondary | ICD-10-CM | POA: Diagnosis not present

## 2020-07-01 DIAGNOSIS — E119 Type 2 diabetes mellitus without complications: Secondary | ICD-10-CM

## 2020-07-01 DIAGNOSIS — R300 Dysuria: Secondary | ICD-10-CM

## 2020-07-01 DIAGNOSIS — I5033 Acute on chronic diastolic (congestive) heart failure: Secondary | ICD-10-CM

## 2020-07-01 LAB — POCT URINALYSIS DIPSTICK
Glucose, UA: POSITIVE — AB
Ketones, UA: NEGATIVE
Leukocytes, UA: NEGATIVE
Nitrite, UA: NEGATIVE
Protein, UA: POSITIVE — AB
Spec Grav, UA: >=1.03 — AB (ref 1.010–1.025)
Urobilinogen, UA: 0.2 E.U./dL
pH, UA: 6 (ref 5.0–8.0)

## 2020-07-01 NOTE — Assessment & Plan Note (Signed)
Given home desensitization exercises to perform.

## 2020-07-01 NOTE — Assessment & Plan Note (Signed)
Due for re-eval. 

## 2020-07-01 NOTE — Assessment & Plan Note (Signed)
UA in office looks clear.. send for culture to verify no continue infection. If repeat UTI present may consider UTI prophylaxis.  If none.. will need further imaging to evaluate cause of low abd pain.  Given urinary elements.. recommended flushing bladder with increased water, and avoidance of bladder irritants.  May need referral to Urology for eval for interstitial cystitis.

## 2020-07-01 NOTE — Patient Instructions (Addendum)
Call to set up yearly eye exam. Call to arrange mammogram.  We will call with lab results and urine results.  Keep up walking! Great job!  Can start home desensitization exercises.  Start compression hose on legs. 15 -30 mmMG
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
## 2020-07-01 NOTE — Assessment & Plan Note (Signed)
Well controlled. Continue current medication.  

## 2020-07-01 NOTE — Assessment & Plan Note (Addendum)
Peripheral edema despite lasix. Start compression hose. Continue working on weight loss and exercise.
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
## 2020-07-02 LAB — LIPID PANEL
Cholesterol: 170 mg/dL (ref <200)
HDL: 44 mg/dL — ABNORMAL LOW (ref >=50)
LDL Cholesterol (Calc): 96 mg/dL (calc)
Non-HDL Cholesterol (Calc): 126 mg/dL (calc) (ref <130)
Total CHOL/HDL Ratio: 3.9 (calc) (ref <5.0)
Triglycerides: 199 mg/dL — ABNORMAL HIGH (ref <150)

## 2020-07-02 LAB — COMPREHENSIVE METABOLIC PANEL
AG Ratio: 0.9 (calc) — ABNORMAL LOW (ref 1.0–2.5)
ALT: 12 U/L (ref 6–29)
AST: 12 U/L (ref 10–35)
Albumin: 3.3 g/dL — ABNORMAL LOW (ref 3.6–5.1)
Alkaline phosphatase (APISO): 114 U/L (ref 37–153)
BUN: 14 mg/dL (ref 7–25)
CO2: 28 mmol/L (ref 20–32)
Calcium: 8.6 mg/dL (ref 8.6–10.4)
Chloride: 100 mmol/L (ref 98–110)
Creat: 0.68 mg/dL (ref 0.50–0.99)
Globulin: 3.5 g/dL (calc) (ref 1.9–3.7)
Glucose, Bld: 230 mg/dL — ABNORMAL HIGH (ref 65–99)
Potassium: 4.2 mmol/L (ref 3.5–5.3)
Sodium: 138 mmol/L (ref 135–146)
Total Bilirubin: 0.4 mg/dL (ref 0.2–1.2)
Total Protein: 6.8 g/dL (ref 6.1–8.1)

## 2020-07-02 LAB — MICROALBUMIN / CREATININE URINE RATIO
Creatinine, Urine: 153 mg/dL (ref 20–275)
Microalb Creat Ratio: 159 mcg/mg creat — ABNORMAL HIGH (ref <30)
Microalb, Ur: 24.4 mg/dL

## 2020-07-02 LAB — HEMOGLOBIN A1C
Hgb A1c MFr Bld: 10.9 % of total Hgb — ABNORMAL HIGH (ref <5.7)
Mean Plasma Glucose: 266 (calc)
eAG (mmol/L): 14.7 (calc)

## 2020-07-03 LAB — URINE CULTURE
MICRO NUMBER:: 10881910
SPECIMEN QUALITY:: ADEQUATE

## 2020-07-04 ENCOUNTER — Other Ambulatory Visit: Payer: Self-pay | Admitting: Family Medicine

## 2020-07-04 ENCOUNTER — Telehealth: Payer: Self-pay

## 2020-07-04 MED ORDER — LEVOFLOXACIN 500 MG PO TABS
500.0000 mg | ORAL_TABLET | Freq: Every day | ORAL | 0 refills | Status: DC
Start: 2020-07-04 — End: 2020-10-15

## 2020-07-04 NOTE — Telephone Encounter (Signed)
Pt left v/m requesting cb with urine results and pt said Dr [name] discussed starting  pt on a longterm med for long time UTIs. Pt wants to know if referral can be put for [facility] to work with pt twice a week for  Rehab. Pt states she is walking a little bit now. Pt also has not talked wit Dr [name] about possibly using [facility] where a nurse will come to the house when needed and [facility] also does xrays in the home if needed. Pt request cb.

## 2020-07-04 NOTE — Telephone Encounter (Signed)
See Result Note.  Lab results discussed with patient.  Will forward to Dr. [name] to address other items.

## 2020-07-05 ENCOUNTER — Other Ambulatory Visit: Payer: Self-pay | Admitting: Family Medicine

## 2020-07-05 DIAGNOSIS — E119 Type 2 diabetes mellitus without complications: Secondary | ICD-10-CM

## 2020-07-05 DIAGNOSIS — Z8781 Personal history of (healed) traumatic fracture: Secondary | ICD-10-CM

## 2020-07-05 DIAGNOSIS — R29898 Other symptoms and signs involving the musculoskeletal system: Secondary | ICD-10-CM

## 2020-07-05 MED ORDER — NITROFURANTOIN MONOHYD MACRO 100 MG PO CAPS
100.0000 mg | ORAL_CAPSULE | Freq: Every day | ORAL | 5 refills | Status: DC
Start: 2020-07-05 — End: 2020-10-15

## 2020-07-05 NOTE — Telephone Encounter (Signed)
Patient left a voicemail stating that she had talked with Butch Penny earlier and needs a call back from her. Patient stated that Levaquin has been sent in to [facility] and it is ready for her to pick up. Patient stated that she is also on a long tern antibiotic because of her having so many frequent UTI's.  Patient stated that she was told that [facility] does not accept her insurance any longer but she has been told differently. Patient stated that she is not sure that she wants home health coming in because she is planning a another trip out of town for a while. Patient requested a call back.

## 2020-07-05 NOTE — Telephone Encounter (Addendum)
[facility] is not currently taking patient's insurance per [employer].  Sandra Ferrell was notified of this.  She will think about if she wants to be referred to another home health agency for [facility] PT and will let us know.  See result note for UTI prophylaxis.  I was informed by patient's husband that we are the ones that need to call and set up [facility] for home visit.  I advised I would check into that and let them know. Per [facility] web site patient needs to call [facility] at [phone] and have insurance ID number available to verify eligibility.  I called Doming[place] and gave her [facility] phone number to call to check her eligibility.

## 2020-07-05 NOTE — Addendum Note (Signed)
Addended byEliezer Lofts E on: 07/05/2020 02:09 PM   Modules accepted: Orders

## 2020-07-05 NOTE — Telephone Encounter (Signed)
See my message below Regina[facility].  I spoke to patient prior to voicemail being taken off phone and issues/concerns were addressed.

## 2020-07-05 NOTE — Progress Notes (Signed)
Referral for [facility]   PT [facility] palced

## 2020-07-05 NOTE — Telephone Encounter (Signed)
Agree with [place].. she sets that up through her insurance I believe.. I have not had to send any order for other patients that did this.  See other note about UTI prophylaxis.  I will order PT through [facility].

## 2020-07-08 ENCOUNTER — Other Ambulatory Visit: Payer: Self-pay | Admitting: Family Medicine

## 2020-07-12 ENCOUNTER — Other Ambulatory Visit: Payer: Self-pay

## 2020-07-12 NOTE — Telephone Encounter (Signed)
Noted  

## 2020-07-12 NOTE — Telephone Encounter (Signed)
Last office visit 07/01/2020 for follow up.  Last refilled 06/20/2020 for #60 with 2 refills.  No future appointments.

## 2020-07-12 NOTE — Telephone Encounter (Signed)
Pt left v/m requesting refills on methocarbamol 500 mg, alprazolam 0.5 mg taking one tab tid and new test strips for accu chek guide. I spoke with pt and methocarbamol was approved earlier today and pt will ck with phamacy when ready for pick up. There are available refills for the accu chek guide test strips at [facility]; and pt said she may have called to early on alprazolam; pt is not out of alprazolam and will request closer to refill date. Pt is OK on medication but Pt wanted Dr [name] to know that Pt finished the levofloxacin on 07/11/20 and pt is to start nitrofurantoin100 mg cap at hs tonight.

## 2020-07-19 ENCOUNTER — Other Ambulatory Visit: Payer: Self-pay

## 2020-07-19 NOTE — Telephone Encounter (Signed)
Last office visit 07/01/2020.  Last refilled 01/03/2019 for #5 with no refills.  Last Vit D level 03/27/2019 which was 26.9 ng/ml.  No future appointments.

## 2020-07-19 NOTE — Telephone Encounter (Signed)
Pt called requesting a refill of Vit D 50,000 units sent to [facility].  Call [phone] with any questions.

## 2020-07-20 MED ORDER — VITAMIN D (ERGOCALCIFEROL) 1.25 MG (50000 UNIT) PO CAPS: 50000.0000 [IU] | ORAL_CAPSULE | ORAL | 3 refills | Status: AC

## 2020-07-24 ENCOUNTER — Other Ambulatory Visit: Payer: Self-pay | Admitting: Family Medicine

## 2020-07-25 ENCOUNTER — Other Ambulatory Visit: Payer: Self-pay | Admitting: *Deleted

## 2020-07-25 NOTE — Telephone Encounter (Signed)
Patient left a voicemail stating that she is having a problem getting a refill on her Methocarbamol and not sure if it is too soon.  Last refill 07/12/20 #60/2 Patient stated that she needs a refill on her Solostar pens because she has increased her units to 50 units to try and get her sugar down. Refill sent today 07/25/20 for the pens. Last office visit 07/01/20

## 2020-07-26 NOTE — Telephone Encounter (Signed)
Noted  

## 2020-07-27 ENCOUNTER — Telehealth: Payer: Self-pay | Admitting: *Deleted

## 2020-07-27 DIAGNOSIS — S72492S Other fracture of lower end of left femur, sequela: Secondary | ICD-10-CM | POA: Diagnosis not present

## 2020-07-27 DIAGNOSIS — S72142D Displaced intertrochanteric fracture of left femur, subsequent encounter for closed fracture with routine healing: Secondary | ICD-10-CM | POA: Diagnosis not present

## 2020-07-27 NOTE — Telephone Encounter (Signed)
Patient wants to know if she can get a refill on her Methocarbamol? Please see previous message.

## 2020-07-27 NOTE — Telephone Encounter (Signed)
Patient called stating that she is out of her Metformin and was told by [facility] that she does not have any refills. Patient stated the last refill she got was from the rehab doctor. Advised patient that I will check with [facility] and see what is going on. Called and spoke to the pharmacist and was advised that patient got #60 on 06/13/20 written by another provider. Was advised that patient does have a refill available that was written on 02/18/20 #180/1 refill. Pharmacist stated that she will go ahead and get the refill ready for the patient.  Called and advised patient that the pharmacy is getting her Metformin ready for her to pick up.

## 2020-07-27 NOTE — Telephone Encounter (Signed)
Patient stated that she was able to get her refill on Methocarbamol and to disregard this message.Marland Kitchen

## 2020-08-01 ENCOUNTER — Other Ambulatory Visit: Payer: Self-pay | Admitting: *Deleted

## 2020-08-01 MED ORDER — METFORMIN HCL 1000 MG PO TABS: ORAL_TABLET | ORAL | 1 refills | Status: AC

## 2020-08-15 ENCOUNTER — Other Ambulatory Visit: Payer: Self-pay | Admitting: Family Medicine

## 2020-08-15 ENCOUNTER — Other Ambulatory Visit: Payer: Self-pay

## 2020-08-15 MED ORDER — GABAPENTIN 300 MG PO CAPS
ORAL_CAPSULE | ORAL | 0 refills | Status: DC
Start: 2020-08-15 — End: 2020-09-01

## 2020-08-15 NOTE — Telephone Encounter (Signed)
Last office visit 07/01/2020 for follow up.  Last refilled 05/03/2020 for #120 with no refills.  No future appointments.

## 2020-08-15 NOTE — Telephone Encounter (Signed)
Vm from pt requesting refill for gabapentin 300 mg be sent to [facility].

## 2020-08-22 ENCOUNTER — Other Ambulatory Visit: Payer: Self-pay | Admitting: Family Medicine

## 2020-08-26 DIAGNOSIS — S72142D Displaced intertrochanteric fracture of left femur, subsequent encounter for closed fracture with routine healing: Secondary | ICD-10-CM | POA: Diagnosis not present

## 2020-08-26 DIAGNOSIS — S72492S Other fracture of lower end of left femur, sequela: Secondary | ICD-10-CM | POA: Diagnosis not present

## 2020-08-30 ENCOUNTER — Telehealth: Payer: Self-pay

## 2020-08-30 ENCOUNTER — Other Ambulatory Visit: Payer: Self-pay | Admitting: Family Medicine

## 2020-08-30 NOTE — Telephone Encounter (Signed)
Spoke with [facility].  Methocarbamol was last refilled 08/13/2020.  No remaining refills.  They have Lantus Solostar and states that is ready to be picked up.  Will forward to Dr. [name] to approve muscle relaxant refills.

## 2020-08-30 NOTE — Telephone Encounter (Signed)
Error

## 2020-08-30 NOTE — Telephone Encounter (Signed)
Josiah notified by telephone that refills will be available for pick up this evening at [facility] for her Lantus Solostar and Methocarbamol.

## 2020-08-30 NOTE — Telephone Encounter (Signed)
Last office visit 07/01/2020 for DM.  Last refilled 07/12/2020 for #60 with 2 refills.  No future appointments.

## 2020-08-30 NOTE — Telephone Encounter (Signed)
Pt left v/m that she thinks she should have one refill left of methocarbamol but [facility] to [facility]CP; pt also request refill of solostar pens; not needles. Per med list appears pt should have solostar pens. I held on for almost 10 ' and no one picked up at [facility]. Pt request cb today. Sending note to [facility] CMA.

## 2020-08-31 ENCOUNTER — Other Ambulatory Visit: Payer: Self-pay | Admitting: Family Medicine

## 2020-08-31 NOTE — Telephone Encounter (Signed)
Last office visit 07/01/2020 for DM.  Last refilled 08/15/2020 for #120 with no refills.  No future appointments.

## 2020-08-31 NOTE — Telephone Encounter (Signed)
Patient left a voicemail stating that [facility] did not get the script for Meth[facility]amol.  Called and spoke to the pharmacist at [facility] and was advised that they did not received the script for Methocarbamol. Advised the pharmacist that the script was sent in 08/30/20 for#60/2. Pharmacist took the order over the phone. Called and advised patient that the pharmacist is getting her script ready for her to pick up and she verbalized understanding.

## 2020-09-01 ENCOUNTER — Other Ambulatory Visit: Payer: Self-pay | Admitting: Family Medicine

## 2020-09-06 NOTE — Telephone Encounter (Signed)
Yes - that would be fine

## 2020-09-14 ENCOUNTER — Telehealth: Payer: Self-pay | Admitting: *Deleted

## 2020-09-14 NOTE — Telephone Encounter (Signed)
Patient called back stating that she spoke with someone at [facility] and was advised that Dr. [name] can do an order for an upright walker, with a seat, bag and cup holder. Patient stated that she was advised that they [address] [place] [address] for supplies.  [facility] [phone]

## 2020-09-14 NOTE — Telephone Encounter (Signed)
Patient called stating that she would like for Dr. [name] to order her a walker. Patient stated that she would like a Rolator walker with a seat and a bag on the side. Patient stated that if her insurance will cover an upright one she thinks that she would prefer that one. Patient stated that she is walking some and would like to have a walker to get out of the wheelchair. Advised patient that she should contact her insurance to find out what type of walker they will cover and to call the office back and message will go to Dr. [name]. Patient verbalized understanding.

## 2020-09-15 ENCOUNTER — Other Ambulatory Visit: Payer: Self-pay | Admitting: Family Medicine

## 2020-09-15 DIAGNOSIS — Z8781 Personal history of (healed) traumatic fracture: Secondary | ICD-10-CM

## 2020-09-15 DIAGNOSIS — R29898 Other symptoms and signs involving the musculoskeletal system: Secondary | ICD-10-CM

## 2020-09-15 NOTE — Telephone Encounter (Signed)
Pt called and said that [facility] does not have walkers until first of 2022. Pt request order sent to different medical supply. I asked pt name of medical supply and fax # pt wanted the walker order to go to. pts husband (DPR signed) came on the phone and said to send either to [facility] or [address]. I advised Mr Lienhard we needed to know the name of the medical supply and fax # that they wanted order to go to and pt or her husband should call to make sure the walker desired is in stock. Mr Seiter said [facility] told him that [facility] would have the walker and Mr Brymer spoke with medical supply on [address] yesterday and they have the walker also. I asked the name of the medical supply [address] and Mr Gilford Rile told me to look it up there was only one medical supply [address]. I googled and there is more than 1 medical supply store [address] and Mr Cerro would need to cb with name and fax # of medical supply or could come to office to pick up written order. Mr Minich said he would call back with info.

## 2020-09-15 NOTE — Telephone Encounter (Signed)
Order faxed.  Family aware.

## 2020-09-15 NOTE — Telephone Encounter (Signed)
Pt left v/m requesting order for walker faxed to [facility] in [facility]; address [address] [place]  And fax # is [phone]. Phone # [phone]. [facility] does have the walker in stock. Pt request cb when done.

## 2020-09-15 NOTE — Telephone Encounter (Signed)
Order placed for DME

## 2020-09-15 NOTE — Telephone Encounter (Signed)
Walker order faxed to [facility] [phone].

## 2020-09-15 NOTE — Progress Notes (Signed)
Order placed for DME rollator walker:  upright walker, with a seat, bag and cup holder. Patient stated that she was advised that they [address] [place] [address] for supplies.  [facility] [phone]

## 2020-09-20 DIAGNOSIS — M6281 Muscle weakness (generalized): Secondary | ICD-10-CM | POA: Diagnosis not present

## 2020-09-21 ENCOUNTER — Telehealth: Payer: Self-pay
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
## 2020-09-26 ENCOUNTER — Other Ambulatory Visit: Payer: Self-pay | Admitting: Family Medicine

## 2020-09-26 DIAGNOSIS — S72142D Displaced intertrochanteric fracture of left femur, subsequent encounter for closed fracture with routine healing: Secondary | ICD-10-CM | POA: Diagnosis not present

## 2020-09-26 DIAGNOSIS — S72492S Other fracture of lower end of left femur, sequela: Secondary | ICD-10-CM | POA: Diagnosis not present

## 2020-09-26 NOTE — Telephone Encounter (Signed)
Last office visit 07/01/2020 for DM.  Last refilled 09/01/2020 for #120 with no refill.  No future appointments.

## 2020-09-26 NOTE — Telephone Encounter (Signed)
Last office visit 07/01/2020 for DM.  Last refilled 08/23/2020 for #90 with no refill.  No future appointments.

## 2020-09-27 NOTE — Telephone Encounter (Signed)
Patient left a voicemail stating that she is trying to get a refill on her Alprazolam and would like it sent ot the pharmacy. Patient requested a call when this has been done.

## 2020-09-28 NOTE — Telephone Encounter (Signed)
Pt left v/m from pt requesting cb with status of refill for gabapentin and pt request refill done on 09/28/20 and pt request cb when refilled.

## 2020-09-28 NOTE — Telephone Encounter (Signed)
Pt left v/m from pt requesting cb with status of refill for alprazolam and pt request refill done on 09/28/20 and pt request cb when refilled.

## 2020-10-07 ENCOUNTER — Encounter (HOSPITAL_COMMUNITY): Payer: Self-pay

## 2020-10-07 ENCOUNTER — Other Ambulatory Visit: Payer: Self-pay

## 2020-10-07 ENCOUNTER — Inpatient Hospital Stay (HOSPITAL_COMMUNITY)
Admission: EM | Admit: 2020-10-07 | Discharge: 2020-10-15 | DRG: 690 | Disposition: A | Discharge status: Skilled Nursing Facility | Payer: Medicare Other | Attending: Internal Medicine | Admitting: Internal Medicine

## 2020-10-07 DIAGNOSIS — N390 Urinary tract infection, site not specified: Secondary | ICD-10-CM

## 2020-10-07 DIAGNOSIS — B961 Klebsiella pneumoniae [K. pneumoniae] as the cause of diseases classified elsewhere: Secondary | ICD-10-CM | POA: Diagnosis present

## 2020-10-07 DIAGNOSIS — Z981 Arthrodesis status: Secondary | ICD-10-CM

## 2020-10-07 DIAGNOSIS — F411 Generalized anxiety disorder: Secondary | ICD-10-CM | POA: Diagnosis present

## 2020-10-07 DIAGNOSIS — Z888 Allergy status to other drugs, medicaments and biological substances status: Secondary | ICD-10-CM

## 2020-10-07 DIAGNOSIS — Z800 Family history of malignant neoplasm of digestive organs: Secondary | ICD-10-CM

## 2020-10-07 DIAGNOSIS — K219 Gastro-esophageal reflux disease without esophagitis: Secondary | ICD-10-CM | POA: Diagnosis present

## 2020-10-07 DIAGNOSIS — R0902 Hypoxemia: Secondary | ICD-10-CM | POA: Diagnosis not present

## 2020-10-07 DIAGNOSIS — E1140 Type 2 diabetes mellitus with diabetic neuropathy, unspecified: Secondary | ICD-10-CM | POA: Diagnosis present

## 2020-10-07 DIAGNOSIS — Z823 Family history of stroke: Secondary | ICD-10-CM

## 2020-10-07 DIAGNOSIS — Z792 Long term (current) use of antibiotics: Secondary | ICD-10-CM

## 2020-10-07 DIAGNOSIS — R5381 Other malaise: Secondary | ICD-10-CM | POA: Diagnosis not present

## 2020-10-07 DIAGNOSIS — N3090 Cystitis, unspecified without hematuria: Secondary | ICD-10-CM | POA: Diagnosis not present

## 2020-10-07 DIAGNOSIS — E119 Type 2 diabetes mellitus without complications: Secondary | ICD-10-CM | POA: Diagnosis not present

## 2020-10-07 DIAGNOSIS — R296 Repeated falls: Secondary | ICD-10-CM | POA: Diagnosis not present

## 2020-10-07 DIAGNOSIS — G894 Chronic pain syndrome: Secondary | ICD-10-CM | POA: Diagnosis present

## 2020-10-07 DIAGNOSIS — I110 Hypertensive heart disease with heart failure: Secondary | ICD-10-CM | POA: Diagnosis not present

## 2020-10-07 DIAGNOSIS — Z794 Long term (current) use of insulin: Secondary | ICD-10-CM | POA: Diagnosis not present

## 2020-10-07 DIAGNOSIS — M797 Fibromyalgia: Secondary | ICD-10-CM | POA: Diagnosis present

## 2020-10-07 DIAGNOSIS — R531 Weakness: Secondary | ICD-10-CM | POA: Diagnosis present

## 2020-10-07 DIAGNOSIS — E1121 Type 2 diabetes mellitus with diabetic nephropathy: Secondary | ICD-10-CM | POA: Diagnosis not present

## 2020-10-07 DIAGNOSIS — I5032 Chronic diastolic (congestive) heart failure: Secondary | ICD-10-CM | POA: Diagnosis present

## 2020-10-07 DIAGNOSIS — E876 Hypokalemia: Secondary | ICD-10-CM | POA: Diagnosis not present

## 2020-10-07 DIAGNOSIS — Z886 Allergy status to analgesic agent status: Secondary | ICD-10-CM

## 2020-10-07 DIAGNOSIS — R278 Other lack of coordination: Secondary | ICD-10-CM | POA: Diagnosis not present

## 2020-10-07 DIAGNOSIS — Z880 Allergy status to penicillin: Secondary | ICD-10-CM

## 2020-10-07 DIAGNOSIS — R262 Difficulty in walking, not elsewhere classified: Secondary | ICD-10-CM

## 2020-10-07 DIAGNOSIS — M2550 Pain in unspecified joint: Secondary | ICD-10-CM | POA: Diagnosis not present

## 2020-10-07 DIAGNOSIS — R2681 Unsteadiness on feet: Secondary | ICD-10-CM | POA: Diagnosis not present

## 2020-10-07 DIAGNOSIS — M1610 Unilateral primary osteoarthritis, unspecified hip: Secondary | ICD-10-CM | POA: Diagnosis present

## 2020-10-07 DIAGNOSIS — Z885 Allergy status to narcotic agent status: Secondary | ICD-10-CM

## 2020-10-07 DIAGNOSIS — Z87891 Personal history of nicotine dependence: Secondary | ICD-10-CM

## 2020-10-07 DIAGNOSIS — I5030 Unspecified diastolic (congestive) heart failure: Secondary | ICD-10-CM | POA: Diagnosis not present

## 2020-10-07 DIAGNOSIS — Z20822 Contact with and (suspected) exposure to covid-19: Secondary | ICD-10-CM | POA: Diagnosis present

## 2020-10-07 DIAGNOSIS — R488 Other symbolic dysfunctions: Secondary | ICD-10-CM | POA: Diagnosis not present

## 2020-10-07 DIAGNOSIS — I10 Essential (primary) hypertension: Secondary | ICD-10-CM | POA: Diagnosis not present

## 2020-10-07 DIAGNOSIS — Z87440 Personal history of urinary (tract) infections: Secondary | ICD-10-CM

## 2020-10-07 DIAGNOSIS — Z743 Need for continuous supervision: Secondary | ICD-10-CM | POA: Diagnosis not present

## 2020-10-07 DIAGNOSIS — E1165 Type 2 diabetes mellitus with hyperglycemia: Secondary | ICD-10-CM | POA: Diagnosis present

## 2020-10-07 DIAGNOSIS — Z79899 Other long term (current) drug therapy: Secondary | ICD-10-CM

## 2020-10-07 DIAGNOSIS — R2689 Other abnormalities of gait and mobility: Secondary | ICD-10-CM | POA: Diagnosis not present

## 2020-10-07 DIAGNOSIS — Z9189 Other specified personal risk factors, not elsewhere classified: Secondary | ICD-10-CM

## 2020-10-07 DIAGNOSIS — R6889 Other general symptoms and signs: Secondary | ICD-10-CM | POA: Diagnosis not present

## 2020-10-07 DIAGNOSIS — Z7951 Long term (current) use of inhaled steroids: Secondary | ICD-10-CM

## 2020-10-07 DIAGNOSIS — R109 Unspecified abdominal pain: Secondary | ICD-10-CM | POA: Diagnosis not present

## 2020-10-07 DIAGNOSIS — E1159 Type 2 diabetes mellitus with other circulatory complications: Secondary | ICD-10-CM | POA: Diagnosis not present

## 2020-10-07 DIAGNOSIS — J449 Chronic obstructive pulmonary disease, unspecified: Secondary | ICD-10-CM | POA: Diagnosis present

## 2020-10-07 DIAGNOSIS — Z6841 Body Mass Index (BMI) 40.0 and over, adult: Secondary | ICD-10-CM

## 2020-10-07 DIAGNOSIS — R9341 Abnormal radiologic findings on diagnostic imaging of renal pelvis, ureter, or bladder: Secondary | ICD-10-CM | POA: Diagnosis present

## 2020-10-07 DIAGNOSIS — Z7984 Long term (current) use of oral hypoglycemic drugs: Secondary | ICD-10-CM

## 2020-10-07 DIAGNOSIS — Z87442 Personal history of urinary calculi: Secondary | ICD-10-CM

## 2020-10-07 DIAGNOSIS — Z1611 Resistance to penicillins: Secondary | ICD-10-CM | POA: Diagnosis present

## 2020-10-07 DIAGNOSIS — Z9049 Acquired absence of other specified parts of digestive tract: Secondary | ICD-10-CM

## 2020-10-07 DIAGNOSIS — E785 Hyperlipidemia, unspecified: Secondary | ICD-10-CM | POA: Diagnosis present

## 2020-10-07 DIAGNOSIS — M6281 Muscle weakness (generalized): Secondary | ICD-10-CM | POA: Diagnosis not present

## 2020-10-07 DIAGNOSIS — Z7401 Bed confinement status: Secondary | ICD-10-CM | POA: Diagnosis not present

## 2020-10-07 LAB — BASIC METABOLIC PANEL
Anion gap: 15 (ref 5–15)
BUN: 11 mg/dL (ref 6–20)
CO2: 21 mmol/L — ABNORMAL LOW (ref 22–32)
Calcium: 8.9 mg/dL (ref 8.9–10.3)
Chloride: 100 mmol/L (ref 98–111)
Creatinine, Ser: 0.59 mg/dL (ref 0.44–1.00)
GFR, Estimated: >60 mL/min (ref >60)
Glucose, Bld: 201 mg/dL — ABNORMAL HIGH (ref 70–99)
Potassium: 4.1 mmol/L (ref 3.5–5.1)
Sodium: 136 mmol/L (ref 135–145)

## 2020-10-07 LAB — CBC
HCT: 40.9 % (ref 36.0–46.0)
Hemoglobin: 12.8 g/dL (ref 12.0–15.0)
MCH: 26.3 pg (ref 26.0–34.0)
MCHC: 31.3 g/dL (ref 30.0–36.0)
MCV: 84 fL (ref 80.0–100.0)
Platelets: 312 10*3/uL (ref 150–400)
RBC: 4.87 MIL/uL (ref 3.87–5.11)
RDW: 13.9 % (ref 11.5–15.5)
WBC: 8.7 10*3/uL (ref 4.0–10.5)
nRBC: 0 % (ref 0.0–0.2)

## 2020-10-07 NOTE — ED Notes (Signed)
APS here to see pt.
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
## 2020-10-08 ENCOUNTER — Emergency Department (HOSPITAL_COMMUNITY): Payer: Medicare Other

## 2020-10-08 DIAGNOSIS — J449 Chronic obstructive pulmonary disease, unspecified: Secondary | ICD-10-CM | POA: Diagnosis present

## 2020-10-08 DIAGNOSIS — N390 Urinary tract infection, site not specified: Secondary | ICD-10-CM

## 2020-10-08 DIAGNOSIS — K219 Gastro-esophageal reflux disease without esophagitis: Secondary | ICD-10-CM | POA: Diagnosis present

## 2020-10-08 DIAGNOSIS — R262 Difficulty in walking, not elsewhere classified: Secondary | ICD-10-CM | POA: Diagnosis not present

## 2020-10-08 DIAGNOSIS — N3090 Cystitis, unspecified without hematuria: Secondary | ICD-10-CM | POA: Diagnosis present

## 2020-10-08 DIAGNOSIS — R9341 Abnormal radiologic findings on diagnostic imaging of renal pelvis, ureter, or bladder: Secondary | ICD-10-CM | POA: Diagnosis present

## 2020-10-08 DIAGNOSIS — G894 Chronic pain syndrome: Secondary | ICD-10-CM | POA: Diagnosis present

## 2020-10-08 DIAGNOSIS — E785 Hyperlipidemia, unspecified: Secondary | ICD-10-CM | POA: Diagnosis present

## 2020-10-08 DIAGNOSIS — E1140 Type 2 diabetes mellitus with diabetic neuropathy, unspecified: Secondary | ICD-10-CM | POA: Diagnosis present

## 2020-10-08 DIAGNOSIS — R296 Repeated falls: Secondary | ICD-10-CM | POA: Diagnosis present

## 2020-10-08 DIAGNOSIS — Z20822 Contact with and (suspected) exposure to covid-19: Secondary | ICD-10-CM | POA: Diagnosis present

## 2020-10-08 DIAGNOSIS — F411 Generalized anxiety disorder: Secondary | ICD-10-CM | POA: Diagnosis present

## 2020-10-08 DIAGNOSIS — E876 Hypokalemia: Secondary | ICD-10-CM | POA: Diagnosis present

## 2020-10-08 DIAGNOSIS — E1121 Type 2 diabetes mellitus with diabetic nephropathy: Secondary | ICD-10-CM | POA: Diagnosis present

## 2020-10-08 DIAGNOSIS — I5032 Chronic diastolic (congestive) heart failure: Secondary | ICD-10-CM | POA: Diagnosis present

## 2020-10-08 DIAGNOSIS — R531 Weakness: Secondary | ICD-10-CM | POA: Diagnosis present

## 2020-10-08 DIAGNOSIS — Z886 Allergy status to analgesic agent status: Secondary | ICD-10-CM | POA: Diagnosis not present

## 2020-10-08 DIAGNOSIS — Z6841 Body Mass Index (BMI) 40.0 and over, adult: Secondary | ICD-10-CM | POA: Diagnosis not present

## 2020-10-08 DIAGNOSIS — Z1611 Resistance to penicillins: Secondary | ICD-10-CM | POA: Diagnosis present

## 2020-10-08 DIAGNOSIS — Z794 Long term (current) use of insulin: Secondary | ICD-10-CM | POA: Diagnosis not present

## 2020-10-08 DIAGNOSIS — M797 Fibromyalgia: Secondary | ICD-10-CM | POA: Diagnosis present

## 2020-10-08 DIAGNOSIS — E1165 Type 2 diabetes mellitus with hyperglycemia: Secondary | ICD-10-CM | POA: Diagnosis present

## 2020-10-08 DIAGNOSIS — I110 Hypertensive heart disease with heart failure: Secondary | ICD-10-CM | POA: Diagnosis present

## 2020-10-08 DIAGNOSIS — B961 Klebsiella pneumoniae [K. pneumoniae] as the cause of diseases classified elsewhere: Secondary | ICD-10-CM | POA: Diagnosis present

## 2020-10-08 DIAGNOSIS — M1610 Unilateral primary osteoarthritis, unspecified hip: Secondary | ICD-10-CM | POA: Diagnosis present

## 2020-10-08 LAB — GLUCOSE, CAPILLARY
Glucose-Capillary: 208 mg/dL — ABNORMAL HIGH (ref 70–99)
Glucose-Capillary: 221 mg/dL — ABNORMAL HIGH (ref 70–99)

## 2020-10-08 LAB — URINALYSIS, ROUTINE W REFLEX MICROSCOPIC
Bilirubin Urine: NEGATIVE
Glucose, UA: 50 mg/dL — AB
Ketones, ur: 20 mg/dL — AB
Nitrite: NEGATIVE
Protein, ur: 100 mg/dL — AB
Specific Gravity, Urine: 1.024 (ref 1.005–1.030)
WBC, UA: >50 WBC/hpf — ABNORMAL HIGH (ref 0–5)
pH: 6 (ref 5.0–8.0)

## 2020-10-08 LAB — HEMOGLOBIN A1C
Hgb A1c MFr Bld: 8.1 % — ABNORMAL HIGH (ref 4.8–5.6)
Mean Plasma Glucose: 185.77 mg/dL

## 2020-10-08 LAB — LACTIC ACID, PLASMA: Lactic Acid, Venous: 1 mmol/L (ref 0.5–1.9)

## 2020-10-08 LAB — RESP PANEL BY RT-PCR (FLU A&B, COVID) ARPGX2
Influenza A by PCR: NEGATIVE
Influenza B by PCR: NEGATIVE
SARS Coronavirus 2 by RT PCR: NEGATIVE

## 2020-10-08 LAB — CBG MONITORING, ED: Glucose-Capillary: 237 mg/dL — ABNORMAL HIGH (ref 70–99)

## 2020-10-08 MED ORDER — SODIUM CHLORIDE 0.9 % IV SOLN
1.0000 g | Freq: Once | INTRAVENOUS | Status: AC
  Administered 2020-10-08: 1 g via INTRAVENOUS
  Filled 2020-10-08: qty 10

## 2020-10-08 MED ORDER — METHOCARBAMOL 500 MG PO TABS
1000.0000 mg | ORAL_TABLET | Freq: Three times a day (TID) | ORAL | Status: DC | PRN
  Administered 2020-10-08 (×2): 1000 mg via ORAL
  Filled 2020-10-08 (×2): qty 2

## 2020-10-08 MED ORDER — ONDANSETRON HCL 4 MG/2ML IJ SOLN: 4.0000 mg | Freq: Four times a day (QID) | INTRAMUSCULAR | Status: DC | PRN

## 2020-10-08 MED ORDER — PANTOPRAZOLE SODIUM 40 MG PO TBEC
40.0000 mg | DELAYED_RELEASE_TABLET | Freq: Every day | ORAL | Status: DC
  Administered 2020-10-08 – 2020-10-14 (×7): 40 mg via ORAL
  Filled 2020-10-08 (×7): qty 1

## 2020-10-08 MED ORDER — ALBUTEROL SULFATE HFA 108 (90 BASE) MCG/ACT IN AERS
2.0000 | INHALATION_SPRAY | RESPIRATORY_TRACT | Status: DC | PRN
  Filled 2020-10-08: qty 6.7

## 2020-10-08 MED ORDER — INSULIN ASPART 100 UNIT/ML ~~LOC~~ SOLN
0.0000 [IU] | Freq: Every day | SUBCUTANEOUS | Status: DC
  Administered 2020-10-08 – 2020-10-12 (×3): 2 [IU] via SUBCUTANEOUS
  Administered 2020-10-13 – 2020-10-14 (×2): 3 [IU] via SUBCUTANEOUS

## 2020-10-08 MED ORDER — ENOXAPARIN SODIUM 40 MG/0.4ML ~~LOC~~ SOLN
40.0000 mg | SUBCUTANEOUS | Status: DC
  Administered 2020-10-08 – 2020-10-13 (×6): 40 mg via SUBCUTANEOUS
  Filled 2020-10-08 (×6): qty 0.4

## 2020-10-08 MED ORDER — FLUTICASONE FUROATE-VILANTEROL 200-25 MCG/INH IN AEPB
1.0000 | INHALATION_SPRAY | Freq: Every day | RESPIRATORY_TRACT | Status: DC
  Filled 2020-10-08: qty 28

## 2020-10-08 MED ORDER — SODIUM CHLORIDE 0.9 % IV SOLN
1.0000 g | INTRAVENOUS | Status: DC
  Administered 2020-10-09 – 2020-10-12 (×4): 1 g via INTRAVENOUS
  Filled 2020-10-08: qty 10
  Filled 2020-10-08: qty 1
  Filled 2020-10-08 (×3): qty 10

## 2020-10-08 MED ORDER — GABAPENTIN 300 MG PO CAPS
300.0000 mg | ORAL_CAPSULE | Freq: Two times a day (BID) | ORAL | Status: DC
  Administered 2020-10-09 – 2020-10-14 (×12): 300 mg via ORAL
  Filled 2020-10-08 (×12): qty 1

## 2020-10-08 MED ORDER — INSULIN ASPART 100 UNIT/ML ~~LOC~~ SOLN
0.0000 [IU] | Freq: Three times a day (TID) | SUBCUTANEOUS | Status: DC
  Administered 2020-10-08: 5 [IU] via SUBCUTANEOUS
  Administered 2020-10-09 (×3): 2 [IU] via SUBCUTANEOUS
  Administered 2020-10-10: 5 [IU] via SUBCUTANEOUS
  Administered 2020-10-10 (×2): 3 [IU] via SUBCUTANEOUS
  Administered 2020-10-11: 1 [IU] via SUBCUTANEOUS
  Administered 2020-10-11 – 2020-10-12 (×3): 3 [IU] via SUBCUTANEOUS
  Administered 2020-10-12: 8 [IU] via SUBCUTANEOUS
  Administered 2020-10-12: 3 [IU] via SUBCUTANEOUS
  Administered 2020-10-13 (×2): 8 [IU] via SUBCUTANEOUS
  Administered 2020-10-13: 5 [IU] via SUBCUTANEOUS
  Administered 2020-10-14: 8 [IU] via SUBCUTANEOUS
  Administered 2020-10-14: 5 [IU] via SUBCUTANEOUS
  Administered 2020-10-14: 3 [IU] via SUBCUTANEOUS

## 2020-10-08 MED ORDER — ONDANSETRON HCL 4 MG PO TABS
4.0000 mg | ORAL_TABLET | Freq: Four times a day (QID) | ORAL | Status: DC | PRN
  Filled 2020-10-08: qty 1

## 2020-10-08 MED ORDER — GEMFIBROZIL 600 MG PO TABS
600.0000 mg | ORAL_TABLET | Freq: Two times a day (BID) | ORAL | Status: DC
  Administered 2020-10-08 – 2020-10-14 (×13): 600 mg via ORAL
  Filled 2020-10-08 (×14): qty 1

## 2020-10-08 MED ORDER — ATORVASTATIN CALCIUM 10 MG PO TABS
10.0000 mg | ORAL_TABLET | Freq: Every day | ORAL | Status: DC
  Administered 2020-10-08 – 2020-10-14 (×7): 10 mg via ORAL
  Filled 2020-10-08 (×7): qty 1

## 2020-10-08 MED ORDER — HYDRALAZINE HCL 25 MG PO TABS
25.0000 mg | ORAL_TABLET | Freq: Four times a day (QID) | ORAL | Status: DC | PRN
  Administered 2020-10-08: 25 mg via ORAL
  Filled 2020-10-08: qty 1

## 2020-10-08 MED ORDER — ACETAMINOPHEN 500 MG PO TABS
1000.0000 mg | ORAL_TABLET | Freq: Four times a day (QID) | ORAL | Status: DC | PRN
  Administered 2020-10-08 – 2020-10-14 (×12): 1000 mg via ORAL
  Filled 2020-10-08 (×12): qty 2

## 2020-10-08 MED ORDER — ACETAMINOPHEN-CODEINE #3 300-30 MG PO TABS
1.0000 | ORAL_TABLET | Freq: Every day | ORAL | Status: DC | PRN
  Administered 2020-10-10 – 2020-10-13 (×4): 1 via ORAL
  Filled 2020-10-08 (×5): qty 1

## 2020-10-08 MED ORDER — ALPRAZOLAM 0.5 MG PO TABS
0.5000 mg | ORAL_TABLET | Freq: Three times a day (TID) | ORAL | Status: DC | PRN
  Administered 2020-10-08 – 2020-10-14 (×13): 0.5 mg via ORAL
  Filled 2020-10-08 (×10): qty 1
  Filled 2020-10-08: qty 2
  Filled 2020-10-08 (×3): qty 1

## 2020-10-08 MED ORDER — ACETAMINOPHEN ER 650 MG PO TBCR: 1300.0000 mg | EXTENDED_RELEASE_TABLET | Freq: Three times a day (TID) | ORAL | Status: DC | PRN

## 2020-10-08 MED ORDER — DIPHENHYDRAMINE HCL 25 MG PO CAPS
25.0000 mg | ORAL_CAPSULE | Freq: Four times a day (QID) | ORAL | Status: DC | PRN
  Administered 2020-10-10: 25 mg via ORAL
  Filled 2020-10-08: qty 1

## 2020-10-08 MED ORDER — AMITRIPTYLINE HCL 50 MG PO TABS
75.0000 mg | ORAL_TABLET | Freq: Every day | ORAL | Status: DC
  Administered 2020-10-08 – 2020-10-14 (×7): 75 mg via ORAL
  Filled 2020-10-08 (×7): qty 1

## 2020-10-08 MED ORDER — IOHEXOL 300 MG/ML  SOLN
100.0000 mL | Freq: Once | INTRAMUSCULAR | Status: AC | PRN
  Administered 2020-10-08: 100 mL via INTRAVENOUS

## 2020-10-08 MED ORDER — METFORMIN HCL 500 MG PO TABS
1000.0000 mg | ORAL_TABLET | Freq: Two times a day (BID) | ORAL | Status: DC
  Administered 2020-10-08 – 2020-10-09 (×2): 1000 mg via ORAL
  Filled 2020-10-08 (×2): qty 2

## 2020-10-08 MED ORDER — GABAPENTIN 300 MG PO CAPS
600.0000 mg | ORAL_CAPSULE | Freq: Every day | ORAL | Status: DC
  Administered 2020-10-08 – 2020-10-14 (×7): 600 mg via ORAL
  Filled 2020-10-08 (×7): qty 2

## 2020-10-08 MED ORDER — INSULIN GLARGINE 100 UNIT/ML ~~LOC~~ SOLN
45.0000 [IU] | Freq: Every day | SUBCUTANEOUS | Status: DC
  Administered 2020-10-08 – 2020-10-14 (×7): 45 [IU] via SUBCUTANEOUS
  Filled 2020-10-08 (×8): qty 0.45

## 2020-10-08 MED ORDER — DIPHENHYDRAMINE HCL 25 MG PO TABS
25.0000 mg | ORAL_TABLET | Freq: Four times a day (QID) | ORAL | Status: DC | PRN
  Filled 2020-10-08: qty 1

## 2020-10-08 MED ORDER — SODIUM CHLORIDE 0.9 % IV BOLUS
1000.0000 mL | Freq: Once | INTRAVENOUS | Status: AC
  Administered 2020-10-08: 1000 mL via INTRAVENOUS

## 2020-10-08 MED ORDER — BUDESONIDE 0.25 MG/2ML IN SUSP
0.2500 mg | Freq: Two times a day (BID) | RESPIRATORY_TRACT | Status: DC
  Administered 2020-10-08 – 2020-10-14 (×10): 0.25 mg via RESPIRATORY_TRACT
  Filled 2020-10-08 (×12): qty 2

## 2020-10-08 NOTE — Progress Notes (Addendum)
NEW ADMISSION NOTE New Admission Note:   Arrival Method:  Patient arrived from ED on stretcher.  Mental Orientation:  Alert and oriented x 4. Telemetry: N/A Assessment: Completed Skin: dry and warm but intact, redness noted under bilateral breasts and ecchymosis noted on the Left hand with the fall. IV: Right FA infusing Rocephin. Pain: Denies any pain. Tubes: Purewick Safety Measures: Safety Fall Prevention Plan has been given, discussed and signed Admission: In process [facility] Orientation: Patient has been orientated to the room, unit and staff.  Family: NOne  Orders have been reviewed and implemented. Will continue to monitor the patient. Call light has been placed within reach and bed alarm has been activated.   Amaryllis Dyke, RN
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
## 2020-10-08 NOTE — Progress Notes (Signed)
Pt is concerned about not having her Symbicort inhaler. RN messaged MD on call to see if we can have an order. Awaiting response.   Eleanora Neighbor, RN
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
## 2020-10-09 LAB — TSH: TSH: 0.848 u[IU]/mL (ref 0.350–4.500)

## 2020-10-09 LAB — GLUCOSE, CAPILLARY
Glucose-Capillary: 139 mg/dL — ABNORMAL HIGH (ref 70–99)
Glucose-Capillary: 124 mg/dL — ABNORMAL HIGH (ref 70–99)
Glucose-Capillary: 131 mg/dL — ABNORMAL HIGH (ref 70–99)
Glucose-Capillary: 192 mg/dL — ABNORMAL HIGH (ref 70–99)

## 2020-10-09 LAB — MAGNESIUM: Magnesium: 1.3 mg/dL — ABNORMAL LOW (ref 1.7–2.4)

## 2020-10-09 LAB — VITAMIN B12: Vitamin B-12: 207 pg/mL (ref 180–914)

## 2020-10-09 LAB — PHOSPHORUS: Phosphorus: 3.4 mg/dL (ref 2.5–4.6)

## 2020-10-09 MED ORDER — MAGNESIUM SULFATE 2 GM/50ML IV SOLN
2.0000 g | Freq: Once | INTRAVENOUS | Status: AC
  Administered 2020-10-09: 2 g via INTRAVENOUS
  Filled 2020-10-09: qty 50

## 2020-10-09 MED ORDER — SODIUM CHLORIDE 0.9 % IV SOLN: INTRAVENOUS | Status: AC

## 2020-10-09 MED ORDER — METHOCARBAMOL 500 MG PO TABS
750.0000 mg | ORAL_TABLET | Freq: Three times a day (TID) | ORAL | Status: DC | PRN
  Administered 2020-10-09 – 2020-10-14 (×3): 750 mg via ORAL
  Filled 2020-10-09 (×3): qty 2
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
## 2020-10-10 LAB — URINE CULTURE: Culture: >=100000 — AB

## 2020-10-10 LAB — CBC
HCT: 35.5 % — ABNORMAL LOW (ref 36.0–46.0)
Hemoglobin: 11.2 g/dL — ABNORMAL LOW (ref 12.0–15.0)
MCH: 26.5 pg (ref 26.0–34.0)
MCHC: 31.5 g/dL (ref 30.0–36.0)
MCV: 83.9 fL (ref 80.0–100.0)
Platelets: 284 10*3/uL (ref 150–400)
RBC: 4.23 MIL/uL (ref 3.87–5.11)
RDW: 13.9 % (ref 11.5–15.5)
WBC: 7.7 10*3/uL (ref 4.0–10.5)
nRBC: 0 % (ref 0.0–0.2)

## 2020-10-10 LAB — BASIC METABOLIC PANEL
Anion gap: 8 (ref 5–15)
BUN: 8 mg/dL (ref 6–20)
CO2: 24 mmol/L (ref 22–32)
Calcium: 8 mg/dL — ABNORMAL LOW (ref 8.9–10.3)
Chloride: 105 mmol/L (ref 98–111)
Creatinine, Ser: 0.8 mg/dL (ref 0.44–1.00)
GFR, Estimated: >60 mL/min (ref >60)
Glucose, Bld: 213 mg/dL — ABNORMAL HIGH (ref 70–99)
Potassium: 3.3 mmol/L — ABNORMAL LOW (ref 3.5–5.1)
Sodium: 137 mmol/L (ref 135–145)

## 2020-10-10 LAB — GLUCOSE, CAPILLARY
Glucose-Capillary: 206 mg/dL — ABNORMAL HIGH (ref 70–99)
Glucose-Capillary: 161 mg/dL — ABNORMAL HIGH (ref 70–99)
Glucose-Capillary: 197 mg/dL — ABNORMAL HIGH (ref 70–99)
Glucose-Capillary: 224 mg/dL — ABNORMAL HIGH (ref 70–99)

## 2020-10-10 LAB — MAGNESIUM: Magnesium: 1.8 mg/dL (ref 1.7–2.4)

## 2020-10-10 MED ORDER — POTASSIUM CHLORIDE CRYS ER 20 MEQ PO TBCR
40.0000 meq | EXTENDED_RELEASE_TABLET | Freq: Once | ORAL | Status: AC
  Administered 2020-10-10: 40 meq via ORAL
  Filled 2020-10-10: qty 2

## 2020-10-10 MED ORDER — MAGNESIUM OXIDE 400 (241.3 MG) MG PO TABS
200.0000 mg | ORAL_TABLET | Freq: Two times a day (BID) | ORAL | Status: AC
  Administered 2020-10-10 – 2020-10-12 (×6): 200 mg via ORAL
  Filled 2020-10-10 (×6): qty 1

## 2020-10-10 NOTE — Progress Notes (Signed)
TOC CM/CSW received a call from [facility]/DSS APS.  [place] [employer] was calling to identify the Education officer, museum working with pt.  CSW gave [place] [employer] the names of Manya Silvas and Crawford Givens.  Ricquita Tarpley-Carter, MSW, LCSW-A Pronouns:  She, Her, Hers                  [facility] Transitions of CareClinical Social Worker [email or web address] [phone]
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
## 2020-10-11 ENCOUNTER — Encounter (HOSPITAL_COMMUNITY): Payer: Self-pay | Admitting: Internal Medicine

## 2020-10-11 LAB — BASIC METABOLIC PANEL
Anion gap: 10 (ref 5–15)
BUN: 13 mg/dL (ref 6–20)
CO2: 23 mmol/L (ref 22–32)
Calcium: 8.2 mg/dL — ABNORMAL LOW (ref 8.9–10.3)
Chloride: 105 mmol/L (ref 98–111)
Creatinine, Ser: 0.72 mg/dL (ref 0.44–1.00)
GFR, Estimated: >60 mL/min (ref >60)
Glucose, Bld: 234 mg/dL — ABNORMAL HIGH (ref 70–99)
Potassium: 4.5 mmol/L (ref 3.5–5.1)
Sodium: 138 mmol/L (ref 135–145)

## 2020-10-11 LAB — GLUCOSE, CAPILLARY
Glucose-Capillary: 199 mg/dL — ABNORMAL HIGH (ref 70–99)
Glucose-Capillary: 202 mg/dL — ABNORMAL HIGH (ref 70–99)
Glucose-Capillary: 143 mg/dL — ABNORMAL HIGH (ref 70–99)
Glucose-Capillary: 189 mg/dL — ABNORMAL HIGH (ref 70–99)
Glucose-Capillary: 172 mg/dL — ABNORMAL HIGH (ref 70–99)
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
## 2020-10-12 ENCOUNTER — Inpatient Hospital Stay: Payer: Medicare Other

## 2020-10-12 LAB — GLUCOSE, CAPILLARY
Glucose-Capillary: 155 mg/dL — ABNORMAL HIGH (ref 70–99)
Glucose-Capillary: 153 mg/dL — ABNORMAL HIGH (ref 70–99)
Glucose-Capillary: 269 mg/dL — ABNORMAL HIGH (ref 70–99)
Glucose-Capillary: 230 mg/dL — ABNORMAL HIGH (ref 70–99)

## 2020-10-12 MED ORDER — LANTUS SOLOSTAR 100 UNIT/ML ~~LOC~~ SOPN: 45.0000 [IU] | PEN_INJECTOR | Freq: Every day | SUBCUTANEOUS | 0 refills | Status: AC

## 2020-10-12 MED ORDER — AMLODIPINE BESYLATE 5 MG PO TABS: 5.0000 mg | ORAL_TABLET | Freq: Every day | ORAL | 0 refills | Status: AC

## 2020-10-12 MED ORDER — METHOCARBAMOL 750 MG PO TABS: 750.0000 mg | ORAL_TABLET | Freq: Three times a day (TID) | ORAL | 0 refills | Status: AC | PRN

## 2020-10-12 MED ORDER — AMLODIPINE BESYLATE 5 MG PO TABS
5.0000 mg | ORAL_TABLET | Freq: Every day | ORAL | Status: DC
  Administered 2020-10-12 – 2020-10-14 (×3): 5 mg via ORAL
  Filled 2020-10-12 (×3): qty 1

## 2020-10-12 MED ORDER — ALPRAZOLAM 0.5 MG PO TABS: 0.5000 mg | ORAL_TABLET | Freq: Three times a day (TID) | ORAL | 0 refills | Status: AC | PRN

## 2020-10-12 MED ORDER — MAGNESIUM OXIDE 400 (241.3 MG) MG PO TABS: 200.0000 mg | ORAL_TABLET | Freq: Two times a day (BID) | ORAL | 0 refills | Status: AC

## 2020-10-12 MED ORDER — ACETAMINOPHEN ER 650 MG PO TBCR: 650.0000 mg | EXTENDED_RELEASE_TABLET | Freq: Three times a day (TID) | ORAL | 0 refills | Status: AC | PRN
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
## 2020-10-12 NOTE — Care Management Important Message (Signed)
Important Message  Patient Details  Name: Sandra Ferrell MRN: [id number] Date of Birth: 1961-01-18   Medicare Important Message Given:  Yes - Important Message mailed due to current National Emergency   Verbal consent obtained due to current National Emergency  Relationship to patient: Self Contact Name: Yahayra Geis Call Date: 10/12/20  Time: 1402 Phone: [phone] Outcome: Spoke with contact Important Message mailed to: Patient address on file    Delorse Lek 10/12/2020, 2:03 PM

## 2020-10-13 LAB — CBC
HCT: 38.7 % (ref 36.0–46.0)
Hemoglobin: 12.3 g/dL (ref 12.0–15.0)
MCH: 26.7 pg (ref 26.0–34.0)
MCHC: 31.8 g/dL (ref 30.0–36.0)
MCV: 84.1 fL (ref 80.0–100.0)
Platelets: 377 10*3/uL (ref 150–400)
RBC: 4.6 MIL/uL (ref 3.87–5.11)
RDW: 14 % (ref 11.5–15.5)
WBC: 10.3 10*3/uL (ref 4.0–10.5)
nRBC: 0 % (ref 0.0–0.2)

## 2020-10-13 LAB — CULTURE, BLOOD (ROUTINE X 2)
Culture: NO GROWTH
Culture: NO GROWTH

## 2020-10-13 LAB — BASIC METABOLIC PANEL
Anion gap: 10 (ref 5–15)
BUN: 19 mg/dL (ref 6–20)
CO2: 24 mmol/L (ref 22–32)
Calcium: 8.5 mg/dL — ABNORMAL LOW (ref 8.9–10.3)
Chloride: 103 mmol/L (ref 98–111)
Creatinine, Ser: 0.93 mg/dL (ref 0.44–1.00)
GFR, Estimated: >60 mL/min (ref >60)
Glucose, Bld: 222 mg/dL — ABNORMAL HIGH (ref 70–99)
Potassium: 4 mmol/L (ref 3.5–5.1)
Sodium: 137 mmol/L (ref 135–145)

## 2020-10-13 LAB — GLUCOSE, CAPILLARY
Glucose-Capillary: 254 mg/dL — ABNORMAL HIGH (ref 70–99)
Glucose-Capillary: 202 mg/dL — ABNORMAL HIGH (ref 70–99)
Glucose-Capillary: 255 mg/dL — ABNORMAL HIGH (ref 70–99)
Glucose-Capillary: 266 mg/dL — ABNORMAL HIGH (ref 70–99)
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
## 2020-10-13 NOTE — TOC Progression Note (Addendum)
Transition of Care [facility]) - Progression Note    Patient Details  Name: Sandra Ferrell MRN: [id number] Date of Birth: Mar 11, 1961  Transition of Care [facility]) CM/SW Contact  Sharlet Salina Mila Homer, LCSW Phone Number: 10/13/2020, 2:32 PM  Clinical Narrative:  Patient agreeable to ST rehab and CSW was able to reach [facility], admissions director at [facility] regarding [place] rehab. They can take patient and Loie advised that [employer] would be contacted.  Call made to [employer] (12:34 pm) and insurance auth initiated. Reference #[id number]. Clinicals faxed to [facility] [phone]).   5:40 pm: CSW received call from [facility] providing authorization information: Approved effective 12/9 Josem Kaufmann #`[id number] - SNF: [facility]. Next review date 12/13, case manager Gilberto Better. Fax number for continued stay clinicals: [phone].   Text sent to admissions director Loie at [facility] regarding insurance auth and patient discharging tomorrow and she responded in agreement.    Expected Discharge Plan: [place] Barriers to Discharge: Continued Medical Work up  Expected Discharge Plan and Services Expected Discharge Plan: [place] In-house Referral: Clinical Social Work     Living arrangements for the past 2 months: Single Family Home                                     Social Determinants of Health (SDOH) Interventions  No SDOH interventions requested or needed at this time.  Readmission Risk Interventions Readmission Risk Prevention Plan 03/26/2019  Transportation Screening Complete  Home Care Screening Complete  Some recent data might be hidden

## 2020-10-13 NOTE — Progress Notes (Signed)
Pt has received 5 days of ceftriaxone for her UTI. Ok to [employer] abx per Dr. Paulina Fusi note.   Onnie Boer, PharmD, BCIDP, AAHIVP, CPP Infectious Disease Pharmacist 10/13/2020 9:33 AM
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
## 2020-10-14 LAB — RESP PANEL BY RT-PCR (FLU A&B, COVID) ARPGX2
Influenza A by PCR: NEGATIVE
Influenza B by PCR: NEGATIVE
SARS Coronavirus 2 by RT PCR: NEGATIVE

## 2020-10-14 LAB — GLUCOSE, CAPILLARY
Glucose-Capillary: 196 mg/dL — ABNORMAL HIGH (ref 70–99)
Glucose-Capillary: 267 mg/dL — ABNORMAL HIGH (ref 70–99)
Glucose-Capillary: 203 mg/dL — ABNORMAL HIGH (ref 70–99)
Glucose-Capillary: 298 mg/dL — ABNORMAL HIGH (ref 70–99)

## 2020-10-14 MED ORDER — POLYETHYLENE GLYCOL 3350 17 G PO PACK
17.0000 g | PACK | Freq: Every day | ORAL | Status: DC
  Administered 2020-10-14: 17 g via ORAL
  Filled 2020-10-14: qty 1

## 2020-10-14 MED ORDER — BISACODYL 5 MG PO TBEC: 5.0000 mg | DELAYED_RELEASE_TABLET | Freq: Every day | ORAL | Status: AC | PRN

## 2020-10-14 MED ORDER — SENNA 8.6 MG PO TABS
1.0000 | ORAL_TABLET | Freq: Two times a day (BID) | ORAL | Status: DC
  Administered 2020-10-14 (×2): 8.6 mg via ORAL
  Filled 2020-10-14 (×2): qty 1

## 2020-10-14 MED ORDER — POLYETHYLENE GLYCOL 3350 17 G PO PACK: 17.0000 g | PACK | Freq: Every day | ORAL | 0 refills | Status: AC | PRN

## 2020-10-14 MED ORDER — BISACODYL 5 MG PO TBEC: 5.0000 mg | DELAYED_RELEASE_TABLET | Freq: Every day | ORAL | Status: DC | PRN
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
## 2020-10-14 NOTE — Progress Notes (Signed)
Called report to facility and spoke to [place], [place].  Paulla Fore, RN, BSN

## 2020-10-14 NOTE — Progress Notes (Signed)
Patient verbalizing she wants to go home instead of SNF because transport is taking long to come get her. She had called her husband to come pick her up. Explained to her that her discharge is for her to go to SNF. Patient states "this is just wrong',it's already 10 o'clock." Explained to patient that transport is transporting a lot of patients but  she is on the list to be transported.. MD,[place] text paged and made aware.  22:25 Spoke to patient's husband and explained extensively that patient's order is to be discharged to the nursing facility not to home. After extensive explanation to husband and patient,patient agreed to go to wait for [facility] transport to go to SNF [facility].

## 2020-10-14 NOTE — Plan of Care (Signed)
  Problem: Urinary Elimination: Goal: Signs and symptoms of infection will decrease Outcome: Completed/Met   Problem: Education: Goal: Knowledge of General Education information will improve Description: Including pain rating scale, medication(s)/side effects and non-pharmacologic comfort measures Outcome: Completed/Met   Problem: Health Behavior/Discharge Planning: Goal: Ability to manage health-related needs will improve Outcome: Completed/Met   Problem: Clinical Measurements: Goal: Ability to maintain clinical measurements within normal limits will improve Outcome: Completed/Met Goal: Will remain free from infection Outcome: Completed/Met Goal: Diagnostic test results will improve Outcome: Completed/Met Goal: Respiratory complications will improve Outcome: Completed/Met Goal: Cardiovascular complication will be avoided Outcome: Completed/Met   Problem: Activity: Goal: Risk for activity intolerance will decrease Outcome: Completed/Met   Problem: Nutrition: Goal: Adequate nutrition will be maintained Outcome: Completed/Met   Problem: Coping: Goal: Level of anxiety will decrease Outcome: Completed/Met   Problem: Elimination: Goal: Will not experience complications related to bowel motility Outcome: Completed/Met Goal: Will not experience complications related to urinary retention Outcome: Completed/Met   Problem: Pain Managment: Goal: General experience of comfort will improve Outcome: Completed/Met   Problem: Safety: Goal: Ability to remain free from injury will improve Outcome: Completed/Met   Problem: Skin Integrity: Goal: Risk for impaired skin integrity will decrease Outcome: Completed/Met   
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
## 2020-10-15 DIAGNOSIS — M2550 Pain in unspecified joint: Secondary | ICD-10-CM | POA: Diagnosis not present

## 2020-10-15 DIAGNOSIS — M6281 Muscle weakness (generalized): Secondary | ICD-10-CM | POA: Diagnosis not present

## 2020-10-15 DIAGNOSIS — Z7401 Bed confinement status: Secondary | ICD-10-CM | POA: Diagnosis not present

## 2020-10-15 DIAGNOSIS — R2689 Other abnormalities of gait and mobility: Secondary | ICD-10-CM | POA: Diagnosis not present

## 2020-10-15 DIAGNOSIS — I10 Essential (primary) hypertension: Secondary | ICD-10-CM | POA: Diagnosis not present

## 2020-10-15 DIAGNOSIS — R2681 Unsteadiness on feet: Secondary | ICD-10-CM | POA: Diagnosis not present

## 2020-10-15 DIAGNOSIS — R296 Repeated falls: Secondary | ICD-10-CM | POA: Diagnosis not present

## 2020-10-15 DIAGNOSIS — N390 Urinary tract infection, site not specified: Secondary | ICD-10-CM | POA: Diagnosis not present

## 2020-10-15 DIAGNOSIS — E1159 Type 2 diabetes mellitus with other circulatory complications: Secondary | ICD-10-CM | POA: Diagnosis not present

## 2020-10-15 DIAGNOSIS — R488 Other symbolic dysfunctions: Secondary | ICD-10-CM | POA: Diagnosis not present

## 2020-10-15 DIAGNOSIS — R278 Other lack of coordination: Secondary | ICD-10-CM | POA: Diagnosis not present

## 2020-10-15 DIAGNOSIS — E119 Type 2 diabetes mellitus without complications: Secondary | ICD-10-CM | POA: Diagnosis not present

## 2020-10-15 DIAGNOSIS — D649 Anemia, unspecified: Secondary | ICD-10-CM | POA: Diagnosis not present

## 2020-10-15 DIAGNOSIS — R5381 Other malaise: Secondary | ICD-10-CM | POA: Diagnosis not present

## 2020-10-15 DIAGNOSIS — Z743 Need for continuous supervision: Secondary | ICD-10-CM | POA: Diagnosis not present

## 2020-10-15 DIAGNOSIS — J449 Chronic obstructive pulmonary disease, unspecified: Secondary | ICD-10-CM | POA: Diagnosis not present

## 2020-10-15 DIAGNOSIS — I5030 Unspecified diastolic (congestive) heart failure: Secondary | ICD-10-CM | POA: Diagnosis not present

## 2020-10-15 NOTE — Progress Notes (Signed)
Patient is discharged to [place] at [facility] by [facility]. Belongings sent with patient. Discharge papers given to [facility] staff.

## 2020-10-17 DIAGNOSIS — E119 Type 2 diabetes mellitus without complications: Secondary | ICD-10-CM | POA: Diagnosis not present

## 2020-10-17 DIAGNOSIS — N390 Urinary tract infection, site not specified: Secondary | ICD-10-CM | POA: Diagnosis not present

## 2020-10-17 DIAGNOSIS — D649 Anemia, unspecified: Secondary | ICD-10-CM | POA: Diagnosis not present

## 2020-10-17 DIAGNOSIS — J449 Chronic obstructive pulmonary disease, unspecified: Secondary | ICD-10-CM | POA: Diagnosis not present

## 2020-10-18 DIAGNOSIS — N390 Urinary tract infection, site not specified: Secondary | ICD-10-CM | POA: Diagnosis not present

## 2020-11-01 DIAGNOSIS — R278 Other lack of coordination: Secondary | ICD-10-CM | POA: Diagnosis not present

## 2020-11-01 DIAGNOSIS — M6281 Muscle weakness (generalized): Secondary | ICD-10-CM | POA: Diagnosis not present

## 2020-11-01 DIAGNOSIS — R2681 Unsteadiness on feet: Secondary | ICD-10-CM | POA: Diagnosis not present

## 2020-11-01 DIAGNOSIS — R296 Repeated falls: Secondary | ICD-10-CM | POA: Diagnosis not present

## 2020-11-01 DIAGNOSIS — N390 Urinary tract infection, site not specified: Secondary | ICD-10-CM | POA: Diagnosis not present

## 2020-11-01 DIAGNOSIS — R2689 Other abnormalities of gait and mobility: Secondary | ICD-10-CM | POA: Diagnosis not present

## 2020-11-01 DIAGNOSIS — R488 Other symbolic dysfunctions: Secondary | ICD-10-CM | POA: Diagnosis not present

## 2020-11-02 DIAGNOSIS — M6281 Muscle weakness (generalized): Secondary | ICD-10-CM | POA: Diagnosis not present

## 2020-11-02 DIAGNOSIS — R488 Other symbolic dysfunctions: Secondary | ICD-10-CM | POA: Diagnosis not present

## 2020-11-02 DIAGNOSIS — R2681 Unsteadiness on feet: Secondary | ICD-10-CM | POA: Diagnosis not present

## 2020-11-02 DIAGNOSIS — R2689 Other abnormalities of gait and mobility: Secondary | ICD-10-CM | POA: Diagnosis not present

## 2020-11-02 DIAGNOSIS — R278 Other lack of coordination: Secondary | ICD-10-CM | POA: Diagnosis not present

## 2020-11-02 DIAGNOSIS — N390 Urinary tract infection, site not specified: Secondary | ICD-10-CM | POA: Diagnosis not present

## 2020-11-02 DIAGNOSIS — R296 Repeated falls: Secondary | ICD-10-CM | POA: Diagnosis not present

## 2020-11-03 DIAGNOSIS — M6281 Muscle weakness (generalized): Secondary | ICD-10-CM | POA: Diagnosis not present

## 2020-11-03 DIAGNOSIS — R296 Repeated falls: Secondary | ICD-10-CM | POA: Diagnosis not present

## 2020-11-03 DIAGNOSIS — R2689 Other abnormalities of gait and mobility: Secondary | ICD-10-CM | POA: Diagnosis not present

## 2020-11-03 DIAGNOSIS — R488 Other symbolic dysfunctions: Secondary | ICD-10-CM | POA: Diagnosis not present

## 2020-11-03 DIAGNOSIS — N390 Urinary tract infection, site not specified: Secondary | ICD-10-CM | POA: Diagnosis not present

## 2020-11-03 DIAGNOSIS — R2681 Unsteadiness on feet: Secondary | ICD-10-CM | POA: Diagnosis not present

## 2020-11-03 DIAGNOSIS — R278 Other lack of coordination: Secondary | ICD-10-CM | POA: Diagnosis not present

## 2020-11-15 DIAGNOSIS — E119 Type 2 diabetes mellitus without complications: Secondary | ICD-10-CM | POA: Diagnosis not present

## 2020-11-15 DIAGNOSIS — S72352D Displaced comminuted fracture of shaft of left femur, subsequent encounter for closed fracture with routine healing: Secondary | ICD-10-CM | POA: Diagnosis not present

## 2020-11-15 DIAGNOSIS — J449 Chronic obstructive pulmonary disease, unspecified: Secondary | ICD-10-CM | POA: Diagnosis not present

## 2020-11-15 DIAGNOSIS — M1990 Unspecified osteoarthritis, unspecified site: Secondary | ICD-10-CM | POA: Diagnosis not present

## 2020-11-16 ENCOUNTER — Telehealth: Payer: Self-pay

## 2020-11-16 DIAGNOSIS — E1142 Type 2 diabetes mellitus with diabetic polyneuropathy: Secondary | ICD-10-CM | POA: Diagnosis not present

## 2020-11-16 DIAGNOSIS — Z794 Long term (current) use of insulin: Secondary | ICD-10-CM | POA: Diagnosis not present

## 2020-11-16 DIAGNOSIS — Z7984 Long term (current) use of oral hypoglycemic drugs: Secondary | ICD-10-CM | POA: Diagnosis not present

## 2020-11-16 DIAGNOSIS — N390 Urinary tract infection, site not specified: Secondary | ICD-10-CM | POA: Diagnosis not present

## 2020-11-16 DIAGNOSIS — R2681 Unsteadiness on feet: Secondary | ICD-10-CM | POA: Diagnosis not present

## 2020-11-16 DIAGNOSIS — J449 Chronic obstructive pulmonary disease, unspecified: Secondary | ICD-10-CM | POA: Diagnosis not present

## 2020-11-16 DIAGNOSIS — M6281 Muscle weakness (generalized): Secondary | ICD-10-CM | POA: Diagnosis not present

## 2020-11-16 NOTE — Telephone Encounter (Signed)
Left message for Gwinda Passe giving verbal orders for PT for 2x a week for 5 Weeks then 1x a week for 3 weeks.

## 2020-11-16 NOTE — Telephone Encounter (Signed)
Sandra Ferrell with [facility] needs verbal orders for  PT for 2x a week for 5 Weeks 1x a week for 3 weeks

## 2020-11-17 ENCOUNTER — Telehealth: Payer: Self-pay | Admitting: *Deleted

## 2020-11-17 DIAGNOSIS — M6281 Muscle weakness (generalized): Secondary | ICD-10-CM | POA: Diagnosis not present

## 2020-11-17 DIAGNOSIS — J449 Chronic obstructive pulmonary disease, unspecified: Secondary | ICD-10-CM | POA: Diagnosis not present

## 2020-11-17 DIAGNOSIS — R2681 Unsteadiness on feet: Secondary | ICD-10-CM | POA: Diagnosis not present

## 2020-11-17 DIAGNOSIS — Z794 Long term (current) use of insulin: Secondary | ICD-10-CM | POA: Diagnosis not present

## 2020-11-17 DIAGNOSIS — Z7984 Long term (current) use of oral hypoglycemic drugs: Secondary | ICD-10-CM | POA: Diagnosis not present

## 2020-11-17 DIAGNOSIS — E1142 Type 2 diabetes mellitus with diabetic polyneuropathy: Secondary | ICD-10-CM | POA: Diagnosis not present

## 2020-11-17 DIAGNOSIS — N390 Urinary tract infection, site not specified: Secondary | ICD-10-CM | POA: Diagnosis not present

## 2020-11-17 NOTE — Telephone Encounter (Signed)
Will Schalla, OT with [facility] called. They did their initial eval on pt and now are requesting verbal orders for, OT 2x a week x4 week  Will CB # [phone]

## 2020-11-18 NOTE — Telephone Encounter (Signed)
Okay to given verbal orders as requested. 

## 2020-11-18 NOTE — Telephone Encounter (Signed)
Will given verbal order via telephone for OT 2x a week x4 week per Dr. [name].

## 2020-11-22 ENCOUNTER — Telehealth: Payer: Self-pay
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
## 2020-11-24 ENCOUNTER — Ambulatory Visit: Payer: Medicare Other | Admitting: Family Medicine

## 2020-11-24 DIAGNOSIS — Z0289 Encounter for other administrative examinations: Secondary | ICD-10-CM

## 2020-11-26 DIAGNOSIS — S72142D Displaced intertrochanteric fracture of left femur, subsequent encounter for closed fracture with routine healing: Secondary | ICD-10-CM | POA: Diagnosis not present

## 2020-11-26 DIAGNOSIS — S72492S Other fracture of lower end of left femur, sequela: Secondary | ICD-10-CM | POA: Diagnosis not present

## 2020-11-29 ENCOUNTER — Inpatient Hospital Stay (HOSPITAL_COMMUNITY)
Admission: EM | Admit: 2020-11-29 | Discharge: 2020-12-06 | DRG: 208 | Disposition: E | Payer: Medicare Other | Attending: Internal Medicine | Admitting: Internal Medicine

## 2020-11-29 ENCOUNTER — Emergency Department (HOSPITAL_COMMUNITY): Payer: Medicare Other

## 2020-11-29 ENCOUNTER — Encounter (HOSPITAL_COMMUNITY): Payer: Self-pay | Admitting: Internal Medicine

## 2020-11-29 DIAGNOSIS — I63131 Cerebral infarction due to embolism of right carotid artery: Secondary | ICD-10-CM | POA: Diagnosis not present

## 2020-11-29 DIAGNOSIS — Z9049 Acquired absence of other specified parts of digestive tract: Secondary | ICD-10-CM

## 2020-11-29 DIAGNOSIS — Z993 Dependence on wheelchair: Secondary | ICD-10-CM

## 2020-11-29 DIAGNOSIS — I6601 Occlusion and stenosis of right middle cerebral artery: Secondary | ICD-10-CM | POA: Diagnosis not present

## 2020-11-29 DIAGNOSIS — Z981 Arthrodesis status: Secondary | ICD-10-CM | POA: Diagnosis not present

## 2020-11-29 DIAGNOSIS — J9601 Acute respiratory failure with hypoxia: Secondary | ICD-10-CM

## 2020-11-29 DIAGNOSIS — I5032 Chronic diastolic (congestive) heart failure: Secondary | ICD-10-CM | POA: Diagnosis present

## 2020-11-29 DIAGNOSIS — J440 Chronic obstructive pulmonary disease with acute lower respiratory infection: Secondary | ICD-10-CM | POA: Diagnosis present

## 2020-11-29 DIAGNOSIS — J90 Pleural effusion, not elsewhere classified: Secondary | ICD-10-CM | POA: Diagnosis not present

## 2020-11-29 DIAGNOSIS — R29728 NIHSS score 28: Secondary | ICD-10-CM | POA: Diagnosis not present

## 2020-11-29 DIAGNOSIS — G936 Cerebral edema: Secondary | ICD-10-CM | POA: Diagnosis not present

## 2020-11-29 DIAGNOSIS — Z794 Long term (current) use of insulin: Secondary | ICD-10-CM

## 2020-11-29 DIAGNOSIS — Z66 Do not resuscitate: Secondary | ICD-10-CM | POA: Diagnosis not present

## 2020-11-29 DIAGNOSIS — R000 Tachycardia, unspecified: Secondary | ICD-10-CM | POA: Diagnosis not present

## 2020-11-29 DIAGNOSIS — J9600 Acute respiratory failure, unspecified whether with hypoxia or hypercapnia: Secondary | ICD-10-CM

## 2020-11-29 DIAGNOSIS — I6602 Occlusion and stenosis of left middle cerebral artery: Secondary | ICD-10-CM | POA: Diagnosis not present

## 2020-11-29 DIAGNOSIS — R29898 Other symptoms and signs involving the musculoskeletal system: Secondary | ICD-10-CM | POA: Diagnosis not present

## 2020-11-29 DIAGNOSIS — J1282 Pneumonia due to coronavirus disease 2019: Secondary | ICD-10-CM | POA: Diagnosis not present

## 2020-11-29 DIAGNOSIS — R109 Unspecified abdominal pain: Secondary | ICD-10-CM | POA: Diagnosis not present

## 2020-11-29 DIAGNOSIS — J80 Acute respiratory distress syndrome: Secondary | ICD-10-CM | POA: Diagnosis present

## 2020-11-29 DIAGNOSIS — M797 Fibromyalgia: Secondary | ICD-10-CM | POA: Diagnosis present

## 2020-11-29 DIAGNOSIS — Z87891 Personal history of nicotine dependence: Secondary | ICD-10-CM

## 2020-11-29 DIAGNOSIS — R29810 Facial weakness: Secondary | ICD-10-CM | POA: Diagnosis not present

## 2020-11-29 DIAGNOSIS — G9349 Other encephalopathy: Secondary | ICD-10-CM

## 2020-11-29 DIAGNOSIS — G8194 Hemiplegia, unspecified affecting left nondominant side: Secondary | ICD-10-CM | POA: Diagnosis not present

## 2020-11-29 DIAGNOSIS — R0602 Shortness of breath: Secondary | ICD-10-CM

## 2020-11-29 DIAGNOSIS — Z7984 Long term (current) use of oral hypoglycemic drugs: Secondary | ICD-10-CM

## 2020-11-29 DIAGNOSIS — Z781 Physical restraint status: Secondary | ICD-10-CM

## 2020-11-29 DIAGNOSIS — Z7951 Long term (current) use of inhaled steroids: Secondary | ICD-10-CM

## 2020-11-29 DIAGNOSIS — Z79899 Other long term (current) drug therapy: Secondary | ICD-10-CM

## 2020-11-29 DIAGNOSIS — G8191 Hemiplegia, unspecified affecting right dominant side: Secondary | ICD-10-CM | POA: Diagnosis not present

## 2020-11-29 DIAGNOSIS — E1165 Type 2 diabetes mellitus with hyperglycemia: Secondary | ICD-10-CM | POA: Diagnosis not present

## 2020-11-29 DIAGNOSIS — L89159 Pressure ulcer of sacral region, unspecified stage: Secondary | ICD-10-CM | POA: Diagnosis present

## 2020-11-29 DIAGNOSIS — J9811 Atelectasis: Secondary | ICD-10-CM | POA: Diagnosis not present

## 2020-11-29 DIAGNOSIS — I499 Cardiac arrhythmia, unspecified: Secondary | ICD-10-CM | POA: Diagnosis not present

## 2020-11-29 DIAGNOSIS — F32A Depression, unspecified: Secondary | ICD-10-CM | POA: Diagnosis present

## 2020-11-29 DIAGNOSIS — E872 Acidosis: Secondary | ICD-10-CM | POA: Diagnosis present

## 2020-11-29 DIAGNOSIS — U071 COVID-19: Principal | ICD-10-CM | POA: Diagnosis present

## 2020-11-29 DIAGNOSIS — I6521 Occlusion and stenosis of right carotid artery: Secondary | ICD-10-CM | POA: Diagnosis not present

## 2020-11-29 DIAGNOSIS — E785 Hyperlipidemia, unspecified: Secondary | ICD-10-CM | POA: Diagnosis not present

## 2020-11-29 DIAGNOSIS — T380X5A Adverse effect of glucocorticoids and synthetic analogues, initial encounter: Secondary | ICD-10-CM | POA: Diagnosis present

## 2020-11-29 DIAGNOSIS — G894 Chronic pain syndrome: Secondary | ICD-10-CM | POA: Diagnosis present

## 2020-11-29 DIAGNOSIS — G4730 Sleep apnea, unspecified: Secondary | ICD-10-CM | POA: Diagnosis present

## 2020-11-29 DIAGNOSIS — R0689 Other abnormalities of breathing: Secondary | ICD-10-CM | POA: Diagnosis not present

## 2020-11-29 DIAGNOSIS — G9340 Encephalopathy, unspecified: Secondary | ICD-10-CM | POA: Diagnosis not present

## 2020-11-29 DIAGNOSIS — G9341 Metabolic encephalopathy: Secondary | ICD-10-CM | POA: Diagnosis not present

## 2020-11-29 DIAGNOSIS — R4701 Aphasia: Secondary | ICD-10-CM | POA: Diagnosis not present

## 2020-11-29 DIAGNOSIS — Z6841 Body Mass Index (BMI) 40.0 and over, adult: Secondary | ICD-10-CM

## 2020-11-29 DIAGNOSIS — Z515 Encounter for palliative care: Secondary | ICD-10-CM | POA: Diagnosis not present

## 2020-11-29 DIAGNOSIS — I6389 Other cerebral infarction: Secondary | ICD-10-CM | POA: Diagnosis not present

## 2020-11-29 DIAGNOSIS — Z743 Need for continuous supervision: Secondary | ICD-10-CM | POA: Diagnosis not present

## 2020-11-29 DIAGNOSIS — I63511 Cerebral infarction due to unspecified occlusion or stenosis of right middle cerebral artery: Secondary | ICD-10-CM | POA: Diagnosis not present

## 2020-11-29 DIAGNOSIS — F411 Generalized anxiety disorder: Secondary | ICD-10-CM | POA: Diagnosis present

## 2020-11-29 DIAGNOSIS — K219 Gastro-esophageal reflux disease without esophagitis: Secondary | ICD-10-CM | POA: Diagnosis present

## 2020-11-29 DIAGNOSIS — J9690 Respiratory failure, unspecified, unspecified whether with hypoxia or hypercapnia: Secondary | ICD-10-CM | POA: Diagnosis not present

## 2020-11-29 DIAGNOSIS — R918 Other nonspecific abnormal finding of lung field: Secondary | ICD-10-CM | POA: Diagnosis not present

## 2020-11-29 DIAGNOSIS — Z978 Presence of other specified devices: Secondary | ICD-10-CM

## 2020-11-29 DIAGNOSIS — J449 Chronic obstructive pulmonary disease, unspecified: Secondary | ICD-10-CM | POA: Diagnosis present

## 2020-11-29 DIAGNOSIS — R739 Hyperglycemia, unspecified: Secondary | ICD-10-CM | POA: Diagnosis not present

## 2020-11-29 DIAGNOSIS — E1169 Type 2 diabetes mellitus with other specified complication: Secondary | ICD-10-CM | POA: Diagnosis not present

## 2020-11-29 DIAGNOSIS — H5704 Mydriasis: Secondary | ICD-10-CM | POA: Diagnosis not present

## 2020-11-29 DIAGNOSIS — I639 Cerebral infarction, unspecified: Secondary | ICD-10-CM | POA: Diagnosis not present

## 2020-11-29 DIAGNOSIS — I110 Hypertensive heart disease with heart failure: Secondary | ICD-10-CM | POA: Diagnosis present

## 2020-11-29 DIAGNOSIS — L8990 Pressure ulcer of unspecified site, unspecified stage: Secondary | ICD-10-CM | POA: Insufficient documentation

## 2020-11-29 DIAGNOSIS — R0902 Hypoxemia: Secondary | ICD-10-CM | POA: Diagnosis not present

## 2020-11-29 DIAGNOSIS — R6889 Other general symptoms and signs: Secondary | ICD-10-CM | POA: Diagnosis not present

## 2020-11-29 LAB — URINALYSIS, ROUTINE W REFLEX MICROSCOPIC
Bilirubin Urine: NEGATIVE
Glucose, UA: >=500 mg/dL — AB
Hgb urine dipstick: NEGATIVE
Ketones, ur: 80 mg/dL — AB
Nitrite: POSITIVE — AB
Protein, ur: >=300 mg/dL — AB
Specific Gravity, Urine: 1.018 (ref 1.005–1.030)
WBC, UA: >50 WBC/hpf — ABNORMAL HIGH (ref 0–5)
pH: 6 (ref 5.0–8.0)

## 2020-11-29 LAB — COMPREHENSIVE METABOLIC PANEL
ALT: 21 U/L (ref 0–44)
AST: 28 U/L (ref 15–41)
Albumin: 2.1 g/dL — ABNORMAL LOW (ref 3.5–5.0)
Alkaline Phosphatase: 102 U/L (ref 38–126)
Anion gap: 16 — ABNORMAL HIGH (ref 5–15)
BUN: 9 mg/dL (ref 6–20)
CO2: 17 mmol/L — ABNORMAL LOW (ref 22–32)
Calcium: 8.3 mg/dL — ABNORMAL LOW (ref 8.9–10.3)
Chloride: 107 mmol/L (ref 98–111)
Creatinine, Ser: 0.75 mg/dL (ref 0.44–1.00)
GFR, Estimated: >60 mL/min (ref >60)
Glucose, Bld: 304 mg/dL — ABNORMAL HIGH (ref 70–99)
Potassium: 3.8 mmol/L (ref 3.5–5.1)
Sodium: 140 mmol/L (ref 135–145)
Total Bilirubin: 1.1 mg/dL (ref 0.3–1.2)
Total Protein: 6.9 g/dL (ref 6.5–8.1)

## 2020-11-29 LAB — CBC WITH DIFFERENTIAL/PLATELET
Abs Immature Granulocytes: 0.06 10*3/uL (ref 0.00–0.07)
Basophils Absolute: 0 10*3/uL (ref 0.0–0.1)
Basophils Relative: 0 %
Eosinophils Absolute: 0 10*3/uL (ref 0.0–0.5)
Eosinophils Relative: 0 %
HCT: 37.9 % (ref 36.0–46.0)
Hemoglobin: 12 g/dL (ref 12.0–15.0)
Immature Granulocytes: 1 %
Lymphocytes Relative: 14 %
Lymphs Abs: 1 10*3/uL (ref 0.7–4.0)
MCH: 26.7 pg (ref 26.0–34.0)
MCHC: 31.7 g/dL (ref 30.0–36.0)
MCV: 84.4 fL (ref 80.0–100.0)
Monocytes Absolute: 0.4 10*3/uL (ref 0.1–1.0)
Monocytes Relative: 5 %
Neutro Abs: 6 10*3/uL (ref 1.7–7.7)
Neutrophils Relative %: 80 %
Platelets: 434 10*3/uL — ABNORMAL HIGH (ref 150–400)
RBC: 4.49 MIL/uL (ref 3.87–5.11)
RDW: 13.9 % (ref 11.5–15.5)
WBC: 7.4 10*3/uL (ref 4.0–10.5)
nRBC: 0 % (ref 0.0–0.2)

## 2020-11-29 LAB — D-DIMER, QUANTITATIVE: D-Dimer, Quant: 3.28 ug/mL-FEU — ABNORMAL HIGH (ref 0.00–0.50)

## 2020-11-29 LAB — I-STAT CHEM 8, ED
BUN: 9 mg/dL (ref 6–20)
Calcium, Ion: 1.11 mmol/L — ABNORMAL LOW (ref 1.15–1.40)
Chloride: 109 mmol/L (ref 98–111)
Creatinine, Ser: 0.5 mg/dL (ref 0.44–1.00)
Glucose, Bld: 296 mg/dL — ABNORMAL HIGH (ref 70–99)
HCT: 34 % — ABNORMAL LOW (ref 36.0–46.0)
Hemoglobin: 11.6 g/dL — ABNORMAL LOW (ref 12.0–15.0)
Potassium: 3.8 mmol/L (ref 3.5–5.1)
Sodium: 142 mmol/L (ref 135–145)
TCO2: 19 mmol/L — ABNORMAL LOW (ref 22–32)

## 2020-11-29 LAB — CBG MONITORING, ED: Glucose-Capillary: 326 mg/dL — ABNORMAL HIGH (ref 70–99)

## 2020-11-29 LAB — C-REACTIVE PROTEIN: CRP: 20.3 mg/dL — ABNORMAL HIGH (ref <1.0)

## 2020-11-29 LAB — PROCALCITONIN: Procalcitonin: <0.1 ng/mL

## 2020-11-29 LAB — LACTATE DEHYDROGENASE: LDH: 444 U/L — ABNORMAL HIGH (ref 98–192)

## 2020-11-29 LAB — MAGNESIUM: Magnesium: 1.5 mg/dL — ABNORMAL LOW (ref 1.7–2.4)

## 2020-11-29 LAB — FIBRINOGEN: Fibrinogen: 763 mg/dL — ABNORMAL HIGH (ref 210–475)

## 2020-11-29 LAB — FERRITIN: Ferritin: 778 ng/mL — ABNORMAL HIGH (ref 11–307)

## 2020-11-29 LAB — SARS CORONAVIRUS 2 BY RT PCR (HOSPITAL ORDER, PERFORMED IN ~~LOC~~ HOSPITAL LAB): SARS Coronavirus 2: POSITIVE — AB

## 2020-11-29 LAB — I-STAT BETA HCG BLOOD, ED (MC, WL, AP ONLY): I-stat hCG, quantitative: <5 m[IU]/mL (ref <5)

## 2020-11-29 LAB — LACTIC ACID, PLASMA: Lactic Acid, Venous: 0.9 mmol/L (ref 0.5–1.9)

## 2020-11-29 LAB — TRIGLYCERIDES: Triglycerides: 130 mg/dL (ref <150)

## 2020-11-29 MED ORDER — DEXAMETHASONE SODIUM PHOSPHATE 10 MG/ML IJ SOLN
10.0000 mg | Freq: Once | INTRAMUSCULAR | Status: AC
  Administered 2020-11-29: 10 mg via INTRAVENOUS
  Filled 2020-11-29: qty 1

## 2020-11-29 MED ORDER — INSULIN ASPART 100 UNIT/ML ~~LOC~~ SOLN
5.0000 [IU] | Freq: Once | SUBCUTANEOUS | Status: AC
  Administered 2020-11-29: 5 [IU] via INTRAVENOUS

## 2020-11-29 MED ORDER — SODIUM CHLORIDE 0.9 % IV SOLN
200.0000 mg | Freq: Once | INTRAVENOUS | Status: AC
  Administered 2020-11-29: 200 mg via INTRAVENOUS
  Filled 2020-11-29: qty 40

## 2020-11-29 MED ORDER — ACETAMINOPHEN 500 MG PO TABS
1000.0000 mg | ORAL_TABLET | Freq: Once | ORAL | Status: AC
  Administered 2020-11-29: 1000 mg via ORAL
  Filled 2020-11-29: qty 2

## 2020-11-29 MED ORDER — DEXAMETHASONE SODIUM PHOSPHATE 10 MG/ML IJ SOLN: 6.0000 mg | INTRAMUSCULAR | Status: DC

## 2020-11-29 MED ORDER — IPRATROPIUM-ALBUTEROL 20-100 MCG/ACT IN AERS
1.0000 | INHALATION_SPRAY | Freq: Four times a day (QID) | RESPIRATORY_TRACT | Status: DC
  Filled 2020-11-29 (×3): qty 4

## 2020-11-29 MED ORDER — ENOXAPARIN SODIUM 40 MG/0.4ML ~~LOC~~ SOLN
40.0000 mg | SUBCUTANEOUS | Status: DC
  Administered 2020-11-29 – 2020-11-30 (×2): 40 mg via SUBCUTANEOUS
  Filled 2020-11-29 (×2): qty 0.4

## 2020-11-29 MED ORDER — DEXAMETHASONE SODIUM PHOSPHATE 10 MG/ML IJ SOLN: 10.0000 mg | Freq: Once | INTRAMUSCULAR | Status: DC

## 2020-11-29 MED ORDER — SODIUM CHLORIDE 0.9 % IV SOLN
100.0000 mg | Freq: Every day | INTRAVENOUS | Status: DC
  Administered 2020-11-30 – 2020-12-01 (×2): 100 mg via INTRAVENOUS
  Filled 2020-11-29 (×3): qty 20

## 2020-11-29 MED ORDER — INSULIN ASPART 100 UNIT/ML ~~LOC~~ SOLN
0.0000 [IU] | Freq: Four times a day (QID) | SUBCUTANEOUS | Status: DC
  Administered 2020-11-30: 5 [IU] via SUBCUTANEOUS
  Administered 2020-11-30: 7 [IU] via SUBCUTANEOUS

## 2020-11-29 MED ORDER — INSULIN GLARGINE 100 UNIT/ML ~~LOC~~ SOLN
15.0000 [IU] | Freq: Every day | SUBCUTANEOUS | Status: DC
  Administered 2020-11-29: 15 [IU] via SUBCUTANEOUS
  Filled 2020-11-29 (×2): qty 0.15

## 2020-11-29 MED ORDER — ALBUTEROL SULFATE HFA 108 (90 BASE) MCG/ACT IN AERS
1.0000 | INHALATION_SPRAY | RESPIRATORY_TRACT | Status: DC | PRN
  Administered 2020-11-30: 2 via RESPIRATORY_TRACT
  Filled 2020-11-29: qty 6.7

## 2020-11-29 MED ORDER — LORAZEPAM 2 MG/ML IJ SOLN
0.5000 mg | INTRAMUSCULAR | Status: DC | PRN
  Administered 2020-11-30 (×2): 0.5 mg via INTRAVENOUS
  Filled 2020-11-29 (×3): qty 1

## 2020-11-29 MED ORDER — LINAGLIPTIN 5 MG PO TABS
5.0000 mg | ORAL_TABLET | Freq: Every day | ORAL | Status: DC
  Administered 2020-11-29 – 2020-11-30 (×2): 5 mg via ORAL
  Filled 2020-11-29 (×2): qty 1

## 2020-11-29 NOTE — ED Notes (Signed)
Pt is considerably more coherent than she has been.  She is A&O x4.  Soft restraints have been removed and she is successfully tolerating HFNC with a saturation of 92%.

## 2020-11-29 NOTE — ED Notes (Signed)
An ED tech was able to go communicate with this pt's husband who was able to call her and speak to her.  This made them both feel better.

## 2020-11-29 NOTE — ED Notes (Signed)
I put mitts on pt that were not attached to anything, because she kept ripping her NRB off and pt still kepting ripping her NRB off.

## 2020-11-29 NOTE — ED Triage Notes (Signed)
[facility] after called for AMS, weakness, decreased appetite. 63% O2 on RA. Placed on NRB @15L . Febrile.
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
## 2020-11-29 NOTE — ED Notes (Signed)
I attempted to put pt on HFNC, but pt Sa02 83%, probably due to pt keep screaming, "help me." I placed pt back on NRB.

## 2020-11-29 NOTE — H&P (Signed)
History and Physical    PLEASE NOTE THAT [employer]AGON DICTATION SOFTWARE WAS USED IN THE CONSTRUCTION OF THIS NOTE.   Sandra Ferrell XNT:[id number] DOB: 12-04-1960 DOA: 11/08/2020  PCP: Jinny Sanders, MD Patient coming from: home   I have personally briefly reviewed patient's old medical records in [place]  Chief Complaint: shortness of breath  HPI: Sandra Ferrell is a 60 y.o. female with medical history significant for chronic diastolic heart failure, hypertension, hyperlipidemia, type 2 diabetes mellitus, COPD, general anxiety disorder,  who is admitted to [facility] on 11/14/2020 with severe COVID-19 pneumonia after presenting from home via EMS to [facility] Emergency Department for evaluation of shortness of breath.  In the setting of the patient's presenting altered mental status, the following history is provided by the patient's son, Sandra Ferrell whom I discussed the case with over the phone today.  Additional history is provided by EMS, my discussions with the emergency department physician, and via chart review.  Per the patient's son, Sandra Ferrell, the patient has been experiencing 5 to 6 days of progressive shortness of breath associated with nonproductive cough and recently developed an objective fever over the last 1 to 2 days, with reported temperature max over that time of 102.  Over the last 2 to 3 days, the patient's son also has noted the patient to be confused, and agitated relative to her baseline mental status.   Past medical history is notable for history of chronic diastolic heart failure, with most recent echocardiogram performed in 2012 showing normal left ventricular cavity size, mild LVH, LVEF 60 to 65%, no evidence of focal wall motion normalities, and evidence of grade 1 diastolic dysfunction.  She has a notable prior smoking history of 1 pack/day for 35 years before completely quitting smoking in 2019.   Son reports the patient has not undergone any recent traveling,  but that she lives with her husband, who has been experiencing progressive respiratory symptoms over the last 10 to 12 days, preceding that of the onset of the patient.  In the setting of the above progression of respiratory symptoms, the patient's son contacted EMS earlier today, reportedly found patient's initial O2 sat to be in the mid 41s on room air, prompting placement of nonrebreather, and brought the patient to [facility] ED for further evaluation.    ED Course:  Vital signs in the ED were notable for the following: Temperature max 22.3; heart rate 1 3134 hours; blood pressure 120/91  - 153/72; respiratory 19-27; initial oxygen saturation on 10 L nonrebreather, was noted to be 95 to 96%.   Labs were notable for the following: CMP was notable for the following: Sodium 140, creatinine 0.75, glucose 293, ionized calcium 1.11.  CBC was notable for the following: White blood cell count is 7400, hemoglobin 12.  Lactic acid 0.9.  Nasopharyngeal COVID-19 PCR performed in the ED today was found to be positive.  Blood cultures x2 collected prior to initiation of any antibiotics.  General inflammatory markers were notable for the following: LDH 444, ferritin 778, D-dimer 3.28, fibrinogen 763, and CRP result currently pending.  Chest x-ray shows evidence of bilateral patchy airspace opacities consistent with Covid pneumonia in the absence of pleural effusions or pneumothorax.  EKG shows sinus tachycardia with heart rate 132, no evidence of T wave or ST changes, including no evidence of ST elevation, also showing potential Q waves in V2/V3, which was also noted to be present on most recent prior EKG from September  2021.  While in the ED, the following were administered: Daejeon 10 mg IV x1, acetaminophen 1 g p.o. x1, NovoLog 5 units IV x1, and remdesivir satiate following inpatient pharmacy consultation.  Subsequently, the patient was admitted to the PCU for further evaluation and management of presenting  acute hypoxic respiratory failure in the setting of severe COVID-19 pneumonia.    Review of Systems: As per HPI otherwise 10 point review of systems negative.   Past Medical History:  Diagnosis Date  . Allergic rhinitis, cause unspecified   . Anemia   . Anxiety state, unspecified   . Backache, unspecified   . Carpal tunnel syndrome, right    Bilateral  . CHF (congestive heart failure) ([place])    unspecified , patient denies  . Chronic pain syndrome   . Constipation   . COPD (chronic obstructive pulmonary disease) ([place])   . Cough   . Depressive disorder, not elsewhere classified    managed with medications  . Difficulty in walking   . Displaced intertrochanteric fracture of left femur ([place])   . Esophageal reflux   . Extrinsic asthma with exacerbation   . Fibromyalgia   . Heart murmur    "a small one"  . History of kidney stones   . Hyperlipidemia   . Hypertension   . Muscle weakness (generalized)   . Nicotine dependence, cigarettes, uncomplicated   . Osteoarthrosis, unspecified whether generalized or localized, unspecified site   . Other abnormalities of gait and mobility   . Other and unspecified hyperlipidemia   . Other irritable bowel syndrome   . Other screening mammogram   . Personal history of unspecified urinary disorder   . Pneumonia   . Pressure ulcer of sacral region   . Routine general medical examination at a health care facility   . Routine gynecological examination   . Tobacco use disorder   . Type II or unspecified type diabetes mellitus without mention of complication, not stated as uncontrolled    Type II  . Unspecified fall, subsequent encounter   . Unspecified sleep apnea   . Urinary tract infection   . Vaginitis   . Wears glasses   . Wheezing     Past Surgical History:  Procedure Laterality Date  . ANTERIOR CERVICAL DECOMP/DISCECTOMY FUSION N/A 01/19/2015   Procedure: ANTERIOR CERVICAL DECOMPRESSION/DISCECTOMY FUSION 2 LEVELS;  Surgeon: Phylliss Bob, MD;  Location: [place];  Service: Orthopedics;  Laterality: N/A;  Anterior cervical decompression fusion, cervical 5-6, cervical 6-7 with instrumentation and allograft  . ANTERIOR CERVICAL DECOMP/DISCECTOMY FUSION N/A 05/23/2017   Procedure: ANTERIOR CERVICAL DECOMPRESSION FUSION, CERVICAL 4-5 WITH INSTRUMENTATION AND ALLOGRAFT;  Surgeon: Phylliss Bob, MD;  Location: [place];  Service: Orthopedics;  Laterality: N/A;  ANTERIOR CERVICAL DECOMPRESSION FUSION, CERVICAL 4-5 WITH INSTRUMENTATION AND ALLOGRAFT; REQUEST 2 HOURS AND FLIP ROOM  . APPENDECTOMY  1973  . BACK SURGERY     Lumbar- "bone graft"  . CHOLECYSTECTOMY  08/2006  . COLONOSCOPY    . HARDWARE REMOVAL Left 08/20/2018   Procedure: HARDWARE REMOVAL FROM LEFT KNEE;  Surgeon: Milly Jakob, MD;  Location: [facility];  Service: Orthopedics;  Laterality: Left;  . HARDWARE REMOVAL Left 05/29/2019   Procedure: REMOVAL OF  LEFT FEMUR HARDWARE;  Surgeon: Shona Needles, MD;  Location: [place];  Service: Orthopedics;  Laterality: Left;  . ORIF FEMUR FRACTURE Left 06/29/2018   Procedure: OPEN REDUCTION INTERNAL FIXATION (ORIF) DISTAL FEMUR FRACTURE;  Surgeon: Milly Jakob, MD;  Location: [place];  Service: Orthopedics;  Laterality: Left;  . ORIF FEMUR FRACTURE Left 05/29/2019   Procedure: REPAIR OF LEFT FEMUR NONUNION;  Surgeon: Shona Needles, MD;  Location: [place];  Service: Orthopedics;  Laterality: Left;  . TRANSTHORACIC ECHOCARDIOGRAM  02/2011   mild LVH, nl EF, mild diastolic dysfunction, no wall motion abnl    Social History:  reports that she quit smoking about 2 years ago. Her smoking use included cigarettes. She has a 35.00 pack-year smoking history. She has never used smokeless tobacco. She reports that she does not drink alcohol and does not use drugs.   Allergies  Allergen Reactions  . Benazepril Anaphylaxis, Swelling and Other (See Comments)    Angioedema, throat swelling  . Diclofenac Sodium Other (See Comments)    GI bleed   . Fluticasone-Salmeterol Hives, Itching and Swelling    Tongue was swollen  . Nsaids Other (See Comments)    Rectal bleeding  . Penicillins Hives and Rash    Did it involve swelling of the face/tongue/throat, SOB, or low BP? No Did it involve sudden or severe rash/hives, skin peeling, or any reaction on the inside of your mouth or nose? No Did you need to seek medical attention at a hospital or doctor's office? No When did it last happen?5 - 6 years If all above answers are "NO", may proceed with cephalosporin use.   Marland Kitchen Morphine And Related     Rash and itching with morphine Patient states she is able to tolerate tramadol without any problems.  . Pioglitazone Swelling    Legs and feet  . Aspirin Other (See Comments)    Burns stomach  . Propoxyphene Other (See Comments)    Darvocet - Headache    Family History  Problem Relation Age of Onset  . Stroke Father   . Colon cancer Cousin      Prior to Admission medications   Medication Sig Start Date End Date Taking? Authorizing Provider  ACCU-CHEK SOFTCLIX LANCETS lancets CHECK BLOOD SUGAR TWICE DAILY AND AS DIRECTED 11/08/16   Bedsole, Amy E, MD  acetaminophen (TYLENOL) 650 MG CR tablet Take 1 tablet (650 mg total) by mouth every 8 (eight) hours as needed for pain. 10/12/20   Regalado, Belkys A, MD  acetaminophen-codeine (TYLENOL #3) 300-30 MG tablet Take 1 tablet by mouth daily as needed for moderate pain.    [provider]  AIMSCO INSULIN SYR ULTRA THIN 31G X 5/16" 0.3 ML MISC  04/02/15   [provider]  albuterol (VENTOLIN HFA) 108 (90 Base) MCG/ACT inhaler Inhale 2 puffs into the lungs every 4 (four) hours as needed for wheezing or shortness of breath. 07/31/19   Horton, Barbette Hair, MD  Alcohol Swabs PADS Check blood sugar twice a day and as directed. Dx E11.9 11/03/15   Jinny Sanders, MD  ALPRAZolam Duanne Moron) 0.5 MG tablet Take 1 tablet (0.5 mg total) by mouth 3 (three) times daily as needed for anxiety.  10/12/20   Regalado, Belkys A, MD  amitriptyline (ELAVIL) 75 MG tablet TAKE 1 TABLET(75 MG) BY MOUTH AT BEDTIME Patient taking differently: Take 75 mg by mouth at bedtime.  06/02/20   Bedsole, Amy E, MD  amLODipine (NORVASC) 5 MG tablet Take 1 tablet (5 mg total) by mouth daily. 10/12/20   Regalado, Belkys A, MD  atorvastatin (LIPITOR) 10 MG tablet TAKE 1 TABLET(10 MG) BY MOUTH DAILY Patient taking differently: Take 10 mg by mouth daily.  09/01/20   Bedsole, Amy E, MD  bisacodyl (DULCOLAX) 5 MG EC  tablet Take 1 tablet (5 mg total) by mouth daily as needed for moderate constipation. 10/14/20   Pokhrel, Corrie Mckusick, MD  Blood Glucose Monitoring Suppl (ACCU-CHEK AVIVA PLUS) w/Device KIT Check blood sugar twice daily and as directed. 02/11/20   Bedsole, Amy E, MD  budesonide-formoterol (SYMBICORT) 160-4.5 MCG/ACT inhaler INHALE 2 PUFFS INTO THE LUNGS TWICE DAILY 08/27/19   Bedsole, Amy E, MD  chlorpheniramine (CHLOR-TRIMETON) 4 MG tablet Take 8 mg by mouth 2 (two) times daily as needed for allergies.    [provider]  furosemide (LASIX) 20 MG tablet TAKE 1 TABLET(20 MG) BY MOUTH DAILY Patient taking differently: Take 20 mg by mouth daily.  06/02/20   Bedsole, Amy E, MD  gabapentin (NEURONTIN) 300 MG capsule TAKE 1 CAPSULE BY MOUTH IN THE MORNING, 1 CAPSULE IN THE AFTERNOON AND 2 CAPSULES AT BEDTIME Patient taking differently: Take 300 mg by mouth See admin instructions. TAKE 1 CAPSULE BY MOUTH IN THE MORNING, 1 CAPSULE IN THE AFTERNOON AND 2 CAPSULES AT BEDTIME 09/28/20   Bedsole, Amy E, MD  gemfibrozil (LOPID) 600 MG tablet Take 1 tablet (600 mg total) by mouth 2 (two) times daily before a meal. 06/25/19   Bedsole, Amy E, MD  glucose blood (ACCU-CHEK GUIDE) test strip TEST BLOOD SUGAR TWICE DAILY AND AS DIRECTED 05/02/20   Bedsole, Amy E, MD  insulin glargine (LANTUS SOLOSTAR) 100 UNIT/ML Solostar Pen Inject 45 Units into the skin at bedtime. 10/12/20   Regalado, Belkys A, MD  Insulin Pen Needle (B-D  ULTRAFINE III SHORT PEN) 31G X 8 MM MISC USE AS DIRECTED WITH INSULIN PEN 08/05/19   Bedsole, Amy E, MD  Lancet Devices (ADJUSTABLE LANCING DEVICE) MISC Check blood sugar twice a day and as directed. Dx E11.9 11/03/15   Jinny Sanders, MD  magnesium oxide (MAG-OX) 400 (241.3 Mg) MG tablet Take 0.5 tablets (200 mg total) by mouth 2 (two) times daily. 10/12/20   Regalado, Belkys A, MD  metFORMIN (GLUCOPHAGE) 1000 MG tablet TAKE 1 TABLET(1000 MG) BY MOUTH TWICE DAILY WITH A MEAL 08/01/20   Bedsole, Amy E, MD  methocarbamol (ROBAXIN) 750 MG tablet Take 1 tablet (750 mg total) by mouth 3 (three) times daily as needed for muscle spasms. 10/12/20   Regalado, Belkys A, MD  nitrofurantoin, macrocrystal-monohydrate, (MACROBID) 100 MG capsule Take 100 mg by mouth at bedtime.    [provider]  omeprazole (PRILOSEC OTC) 20 MG tablet Take 20 mg by mouth daily.     [provider]  OVER THE COUNTER MEDICATION Apply 1 application topically daily as needed (pain). Hemp oil    [provider]  polyethylene glycol (MIRALAX / GLYCOLAX) 17 g packet Take 17 g by mouth daily as needed for moderate constipation. 10/14/20   Pokhrel, Corrie Mckusick, MD  Vitamin D, Ergocalciferol, (DRISDOL) 1.25 MG (50000 UNIT) CAPS capsule Take 1 capsule (50,000 Units total) by mouth every 7 (seven) days. Patient taking differently: Take 50,000 Units by mouth every Sunday.  07/20/20   Jinny Sanders, MD     Objective    Physical Exam: Vitals:   11/22/2020 1245 11/26/2020 1300 11/13/2020 1330 11/11/2020 1345  BP: (!) 153/71 139/76 (!) 120/91   Pulse: (!) 130 (!) 134 (!) 135 (!) 54  Resp: (!) 26 19    Temp:      TempSrc:      SpO2: 94% (!) 88%  93%    General: appears to be stated age; confused, agitated, unable to follow instructions Skin:  warm, dry, no rash Head:  AT/[place] Mouth:  Oral mucosa membranes appear moist, normal dentition Neck: supple; trachea midline Heart:  Tachycardic, but regular; did not appreciate any  M/R/G Lungs: Diminished bibasilar breath sounds , but otherwise CTAB; did not appreciate any wheezes, rales, or rhonchi Abdomen: + BS; soft, ND, NT Vascular: 2+ pedal pulses b/l; 2+ radial pulses b/l Extremities: no peripheral edema, no muscle wasting Neuro: In the setting of patient's current altered mental status and associated inability to follow instructions at this time, unable to perform full assessment of strength, sensation, or cranial nerves.    Labs on Admission: I have personally reviewed following labs and imaging studies  CBC: Recent Labs  Lab 11/23/2020 1250 11/23/2020 1301  WBC 7.4  --   NEUTROABS 6.0  --   HGB 12.0 11.6*  HCT 37.9 34.0*  MCV 84.4  --   PLT 434*  --    Basic Metabolic Panel: Recent Labs  Lab 11/25/2020 1250 11/20/2020 1301  NA 140 142  K 3.8 3.8  CL 107 109  CO2 17*  --   GLUCOSE 304* 296*  BUN 9 9  CREATININE 0.75 0.50  CALCIUM 8.3*  --    GFR: CrCl cannot be calculated (Unknown ideal weight.). Liver Function Tests: Recent Labs  Lab 11/20/2020 1250  AST 28  ALT 21  ALKPHOS 102  BILITOT 1.1  PROT 6.9  ALBUMIN 2.1*   No results for input(s): LIPASE, AMYLASE in the last 168 hours. No results for input(s): AMMONIA in the last 168 hours. Coagulation Profile: No results for input(s): INR, PROTIME in the last 168 hours. Cardiac Enzymes: No results for input(s): CKTOTAL, CKMB, CKMBINDEX, TROPONINI in the last 168 hours. BNP (last 3 results) No results for input(s): PROBNP in the last 8760 hours. HbA1C: No results for input(s): HGBA1C in the last 72 hours. CBG: No results for input(s): GLUCAP in the last 168 hours. Lipid Profile: Recent Labs    11/28/2020 1250  TRIG 130   Thyroid Function Tests: No results for input(s): TSH, T4TOTAL, FREET4, T3FREE, THYROIDAB in the last 72 hours. Anemia Panel: Recent Labs    11/27/2020 1250  FERRITIN 778*   Urine analysis:    Component Value Date/Time   COLORURINE YELLOW 10/07/2020 0755    APPEARANCEUR CLOUDY (A) 10/07/2020 0755   LABSPEC 1.024 10/07/2020 0755   PHURINE 6.0 10/07/2020 0755   GLUCOSEU 50 (A) 10/07/2020 0755   GLUCOSEU NEGATIVE 12/26/2010 1044   HGBUR SMALL (A) 10/07/2020 0755   HGBUR large 04/10/2010 1507   BILIRUBINUR NEGATIVE 10/07/2020 0755   BILIRUBINUR 2+ 07/01/2020 1717   KETONESUR 20 (A) 10/07/2020 0755   PROTEINUR 100 (A) 10/07/2020 0755   UROBILINOGEN 0.2 07/01/2020 1717   UROBILINOGEN 0.2 01/12/2015 1037   NITRITE NEGATIVE 10/07/2020 0755   LEUKOCYTESUR LARGE (A) 10/07/2020 0755    Radiological Exams on Admission: DG Chest Port 1 View  Result Date: 11/13/2020 CLINICAL DATA:  Shortness of breath EXAM: PORTABLE CHEST 1 VIEW COMPARISON:  07/31/2019 FINDINGS: Cardiac shadow is within normal limits. Patchy airspace opacity is noted bilaterally consistent with multifocal pneumonia. Correlate with COVID testing. No effusion is seen. Postsurgical changes in the cervical spine are noted. IMPRESSION: Increased bilateral airspace opacities consistent with multifocal pneumonia. Correlate with COVID-19 testing. Electronically Signed   By: Inez Catalina M.D.   On: 11/09/2020 12:47     EKG: Independently reviewed, with result as described above.    Assessment/Plan   IMOGENE GRAVELLE is a 60  y.o. female with medical history significant for chronic diastolic heart failure, hypertension, hyperlipidemia, type 2 diabetes mellitus, COPD, general anxiety disorder,  who is admitted to [facility] on 11/23/2020 with severe COVID-19 pneumonia after presenting from home via EMS to [facility] Emergency Department for evaluation of shortness of breath.  Principal Problem:   COVID-19 virus infection Active Problems:   Hyperlipidemia associated with type 2 diabetes mellitus (HCC)   GAD (generalized anxiety disorder)   Acute respiratory failure with hypoxia (HCC)   SOB (shortness of breath)   Acute metabolic encephalopathy   COPD (chronic obstructive pulmonary disease)  (HCC)     #) Severe COVID-19 pneumonia: diagnosis on the basis of 5 to 6 days of progressive shortness of breath associated with new onset nonproductive cough, objective fever, with initial oxygen saturations reported to be in the 60s on room air, with subsequent improvement into the mid 90s on 10 L nonrebreather.  Additionally, presenting chest x-ray shows evidence of bilateral patchy airspace opacities consistent with Covid pneumonia , while preliminary generalized inflammatory markers are found to be elevated in a pattern consistent with COVID-19 infection.  In setting of acute hypoxia, criteria are met from patient's COVID-19 infection to be considered severe in nature. Consequently, there is a Grade 2c rec for dexamethasone, which is further supported by treatment guidance recommendations from [place]'s Covid Treatment Guidelines.  Of note, in the setting of the patient's age of 20 with conveys that include type 2 diabetes, hypertension, and CHF, this patient meets criteria to be considered high risk for a more complicated clinical course of COVID-19 infection, including increased probability for progression of the severity associated with their clinical course. Therefore, in the setting of symptomatic COVID-19 infection requiring hospitalization for further evaluation and management thereof, and given that duration since onset of patient's respiratory symptoms is less than 7 days in the context of the presence of high risk criteria, indications are met for initiation of remdesivir per treatment guidance recommendations from [place]'s Covid Treatment Guidelines. Of note, ALT found to be less than 220. Therefore, there is no contraindication for initiation of remdesivir on the basis of transaminitis.  Suspect the patient's husband, with whom she lives, also has COVID-19 given similar respiratory symptoms of preceding onset. Of note, in the setting of a history of diabetes, will plan to initiate  daily linagliptin as DPP-4 inhibitors have been shown to reduce mortality in patients with DM2 and a COVID-19, once patient's mental status improves such that she is safely able to tolerate PO.  The patient's son, whom I discussed the patient's case with in the absence of being able to reach the patient's husband, confirmed that the patient would wish to be full code and would also be amenable to intubation in the setting of a primary respiratory indication to do so.  Patient's son also conveys that the patient would be amenable to initiation of Tocilizumab , subsequent indication for initiation of such developed.    Plan: Airborne and contact precautions. Monitor continuous pulse oximetry and monitor on telemetry. prn supplemental O2 to maintain O2 sats greater than or equal to 94%. Proning protocol initiated. PRN albuterol inhaler. Scheduled combivent inhaler q6H. PRN acetaminophen for fever. Start dexamethasone and remdesivir, as above. Check inflammatory markers (fibrinogen, d dimer or fibrin derivatives, crp, ferritin, LDH) in the morning. Check serum magnesium and phosphorus levels. Check CMP and CBC in the morning. Check ABG for the purpose of evaluating PaO2 to FiO2 ratio. Flutter valve and incentive  spirometry. If rapid progression of supplemental oxygen demand or if development of need for high flow O2, would consider initiation of Tocilizumab at that point. In setting of DM2, will start linagliptin 5 mg PO Qdaily for associated mortality benefit, as above, once patient's mental status improves sufficiently that she is safely able to tolerate p.o.         #) Severe Sepsis: Appears to be on the basis of COVID-19, as above, with SIRS criteria met via presenting objective fever, tachycardia, and tachypnea.  In the context of concomitant presenting acute hypoxic respiratory failure, criteria are met for patient sepsis to be considered severe in nature.  Of note presenting lactic acid found to be  nonelevated at 0.9.  Consequently, in the absence of an elevated lactic acid level greater than or equal to 4.0 and in the absence of any associated evidence of hypotension, criteria are not met at this time for administration of a 30 mL/kg IVF bolus.  No evidence of additional underlying infectious process beyond COVID-19 at this time, and bacterial pneumonia is also felt to be less likely.  Of note, urinalysis has been ordered, with result currently pending. As the patient sepsis is felt to be viral in nature, will refrain from antibiotic coverage at this time.   Plan: Work-up and management of COVID-19 infection, as above.  Follow-up results of blood cultures x2 collected in the ED this evening repeat CBC with differential in the morning.  Check urinalysis. PRN acetaminophen for fever.  Follow for result of procalcitonin. Will refrain from antibiotics for now per above rationale.      #) Acute hypoxic respiratory failure: in the context of no known baseline supplemental oxygen requirements, presenting O2 sat reported to be in the mid 60s on room air, improving into the mid to high 90s on 10 L nonrebreather. Appears to be on the basis of COVID-19 infection, as above. ACS is felt to be less likely at this time in the absence of any reported recent chest pain and in the context of presenting EKG showing no evidence of acute ischemic process. No clinical or radiographic evidence of suggest acutely decompensated heart failure at this time. While there is increased risk for acute PE in the setting of COVID-19 infection, clinically, this appears to be less likely at this time. If rapid progression of supplemental oxygen demand or if development of need for high flow O2, would consider initiation of Tocilizumab at that point.   Plan: further evaluation and management of presenting COVID-19 infection, as above, including monitoring of continuous pulse oximetry with prn supplemental O2 to maintain O2 sats  greater than or equal to 94%. monitor on telemetry. Trending of inflammatory markers, as above. Check CMP and CBC in the morning. Check serum Mg and Phos levels. Check ABG for the purpose of evaluating PaO2 to FiO2 ratio. Flutter valve and incentive spirometry. PRN albuterol inhaler. Scheduled combivent inhaler q6H. Decadron and remdesivir, as above.      #) Acute metabolic encephalopathy: Per the patient's son, the patient 3 to 4 days of progressive confusion and agitation relative to baseline mental status.  Appears to have basis of physiologic stressors stemming from presenting severe sepsis in the context of severe COVID-19 pneumonia, as further described above.  No obvious additional contributory infectious process at this time, although urinalysis result is currently pending.  There may also be a pharmacologic contribution in the setting of outpatient med list that includes several central acting medications that include gabapentin, as  needed Xanax, amitriptyline methocarbamol.  We will unable to perform a full neurologic assessment at this time, the patient appears to be moving all 4 extremities spontaneously, for acute ischemic CVA is felt to be less likely at this time.  Additionally, seizures are also felt to be unlikely.  We will keep patient n.p.o. until mental status improves sufficiently such the patient is at risk pain and pass nursing bedside swallow evaluation.   Plan: NPO. CMP in the morning. Repeat CBC in the morning. Check ABG to evaluate for any contribution from hypercapnic encephalopathy. Check TSH.  We will hold home central acting medications for now, including that of gabapentin, as needed Xanax, as needed methocarbamol.  Follow for result of urinalysis.      #) Type 2 diabetes mellitus: On Lantus 45 units subcu nightly as an outpatient in addition to taking Metformin.  Presenting blood sugar per presenting CMP noted to be 293.   Plan: Hold Metformin during this  hospitalization.  We will conservatively reinitiate basal insulin in the form of Lantus 15 units subcu nightly.  Accu-Cheks every 6 hours with low-dose sliding scale insulin.     #) Hypertension: Patient hypertensive regimen includes Norvasc, Lasix.  Presenting systolic blood pressures noted to be in the range of the 120s to 150s.  In the setting of presenting severe sepsis, will hold antihypertensive medications for now.  Plan: Close monitoring of ensuing blood pressures via routine vital signs.  Hold home hypertensive medications for now, as above.     #) Hyperlipidemia: Outpatient regimen includes atorvastatin as well as gemfibrozil.  Plan: In the setting of current n.p.o. status, will hold atorvastatin and gemfibrozil for now.      #)Chronic diastolic heart failure: In the setting of most recent echocardiogram from 2012, with results as further described above, including that of evidence of grade 1 diastolic dysfunction at that time.  No clinical or radiographic evidence to suggest acute decompensated heart failure at this time.  Outpatient diuretic regimen consists of Lasix 20 mg p.o. daily.  Plan: In the setting of current euvolemia, will hold home Lasix for now.  Monitor strict I's and O's and daily weights.  Repeat BMP in the morning.       #) COPD: Documented history of such in the setting of significant past smoking history, as noted above, with report the patient completely quit smoking in 2019.  Outpatient respiratory regimen includes Symbicort as well as as needed albuterol.   Plan: Combivent any other acute 6 hours as well as as needed albuterol inhaler, as above.  Monitor continuous pulse oximetry.  Monitor on telemetry.  Additional work-up and management of presenting severe COVID-19 pneumonia, as above, including that of systemic steroids, with potential for transitioning to Solu-Medrol.  Add on serum magnesium and phosphorus levels.     #) Generalized anxiety  disorder: Outpatient medication list includes as needed Xanax.  Plan: Hold.  Xanax for now.  In the setting of the patient's agitation, in which she is at times attempting to remove EKG leads nonrebreather mask, I have ordered as needed IV Ativan for use if redirection efforts fail.     DVT prophylaxis: Lovenox milligram subcu daily Code Status: Full code (as confirmed per my discussions with the patient's son today).  Family Communication: Case was discussed with the patient's son, Sandra Ferrell, who can be reached at the following phone number: [phone].  Disposition Plan: Per Rounding Team Consults called: none  Admission status: inpatient; pcu    Of  note, this patient was added by me to the following Admit List/Treatment Team:  mcadmits     PLEASE NOTE THAT [employer]AG[employer] DICTATION SOFTWARE WAS USED IN THE CONSTRUCTION OF THIS NOTE.   [facility] Pager [phone] From 12PM- 8PM  Otherwise, please contact night-coverage  [email or web address] Password [facility]  11/06/2020, 2:19 PM

## 2020-11-30 ENCOUNTER — Inpatient Hospital Stay (HOSPITAL_COMMUNITY): Payer: Medicare Other

## 2020-11-30 DIAGNOSIS — U071 COVID-19: Secondary | ICD-10-CM | POA: Diagnosis not present

## 2020-11-30 DIAGNOSIS — I639 Cerebral infarction, unspecified: Secondary | ICD-10-CM | POA: Diagnosis not present

## 2020-11-30 LAB — I-STAT ARTERIAL BLOOD GAS, ED
Acid-base deficit: 5 mmol/L — ABNORMAL HIGH (ref 0.0–2.0)
Bicarbonate: 18 mmol/L — ABNORMAL LOW (ref 20.0–28.0)
Calcium, Ion: 1.21 mmol/L (ref 1.15–1.40)
HCT: 33 % — ABNORMAL LOW (ref 36.0–46.0)
Hemoglobin: 11.2 g/dL — ABNORMAL LOW (ref 12.0–15.0)
O2 Saturation: 97 %
Patient temperature: 98.3
Potassium: 3.6 mmol/L (ref 3.5–5.1)
Sodium: 141 mmol/L (ref 135–145)
TCO2: 19 mmol/L — ABNORMAL LOW (ref 22–32)
pCO2 arterial: 26.3 mmHg — ABNORMAL LOW (ref 32.0–48.0)
pH, Arterial: 7.442 (ref 7.350–7.450)
pO2, Arterial: 83 mmHg (ref 83.0–108.0)

## 2020-11-30 LAB — COMPREHENSIVE METABOLIC PANEL
ALT: 23 U/L (ref 0–44)
AST: 27 U/L (ref 15–41)
Albumin: 2.2 g/dL — ABNORMAL LOW (ref 3.5–5.0)
Alkaline Phosphatase: 99 U/L (ref 38–126)
Anion gap: 16 — ABNORMAL HIGH (ref 5–15)
BUN: 14 mg/dL (ref 6–20)
CO2: 17 mmol/L — ABNORMAL LOW (ref 22–32)
Calcium: 8.6 mg/dL — ABNORMAL LOW (ref 8.9–10.3)
Chloride: 108 mmol/L (ref 98–111)
Creatinine, Ser: 0.72 mg/dL (ref 0.44–1.00)
GFR, Estimated: >60 mL/min (ref >60)
Glucose, Bld: 352 mg/dL — ABNORMAL HIGH (ref 70–99)
Potassium: 4 mmol/L (ref 3.5–5.1)
Sodium: 141 mmol/L (ref 135–145)
Total Bilirubin: 1.1 mg/dL (ref 0.3–1.2)
Total Protein: 6.9 g/dL (ref 6.5–8.1)

## 2020-11-30 LAB — BASIC METABOLIC PANEL WITH GFR
Anion gap: 13 (ref 5–15)
BUN: 13 mg/dL (ref 6–20)
CO2: 20 mmol/L — ABNORMAL LOW (ref 22–32)
Calcium: 8.5 mg/dL — ABNORMAL LOW (ref 8.9–10.3)
Chloride: 107 mmol/L (ref 98–111)
Creatinine, Ser: 0.62 mg/dL (ref 0.44–1.00)
GFR, Estimated: >60 mL/min (ref >60)
Glucose, Bld: 307 mg/dL — ABNORMAL HIGH (ref 70–99)
Potassium: 3.7 mmol/L (ref 3.5–5.1)
Sodium: 140 mmol/L (ref 135–145)

## 2020-11-30 LAB — CBC WITH DIFFERENTIAL/PLATELET
Abs Immature Granulocytes: 0 10*3/uL (ref 0.00–0.07)
Basophils Absolute: 0 10*3/uL (ref 0.0–0.1)
Basophils Relative: 0 %
Eosinophils Absolute: 0 10*3/uL (ref 0.0–0.5)
Eosinophils Relative: 0 %
HCT: 37.2 % (ref 36.0–46.0)
Hemoglobin: 12.8 g/dL (ref 12.0–15.0)
Lymphocytes Relative: 16 %
Lymphs Abs: 1 10*3/uL (ref 0.7–4.0)
MCH: 28.4 pg (ref 26.0–34.0)
MCHC: 34.4 g/dL (ref 30.0–36.0)
MCV: 82.5 fL (ref 80.0–100.0)
Monocytes Absolute: 0.3 10*3/uL (ref 0.1–1.0)
Monocytes Relative: 4 %
Neutro Abs: 5 10*3/uL (ref 1.7–7.7)
Neutrophils Relative %: 80 %
Platelets: 442 10*3/uL — ABNORMAL HIGH (ref 150–400)
RBC: 4.51 MIL/uL (ref 3.87–5.11)
RDW: 13.7 % (ref 11.5–15.5)
WBC: 6.3 10*3/uL (ref 4.0–10.5)
nRBC: 0 % (ref 0.0–0.2)
nRBC: 0 /100 WBC

## 2020-11-30 LAB — C-REACTIVE PROTEIN: CRP: 21.9 mg/dL — ABNORMAL HIGH (ref <1.0)

## 2020-11-30 LAB — GLUCOSE, CAPILLARY
Glucose-Capillary: 304 mg/dL — ABNORMAL HIGH (ref 70–99)
Glucose-Capillary: 307 mg/dL — ABNORMAL HIGH (ref 70–99)
Glucose-Capillary: 290 mg/dL — ABNORMAL HIGH (ref 70–99)

## 2020-11-30 LAB — FERRITIN: Ferritin: 988 ng/mL — ABNORMAL HIGH (ref 11–307)

## 2020-11-30 LAB — MRSA PCR SCREENING: MRSA by PCR: NEGATIVE

## 2020-11-30 LAB — FIBRINOGEN: Fibrinogen: 795 mg/dL — ABNORMAL HIGH (ref 210–475)

## 2020-11-30 LAB — TSH: TSH: 0.278 u[IU]/mL — ABNORMAL LOW (ref 0.350–4.500)

## 2020-11-30 LAB — CBG MONITORING, ED
Glucose-Capillary: 281 mg/dL — ABNORMAL HIGH (ref 70–99)
Glucose-Capillary: 313 mg/dL — ABNORMAL HIGH (ref 70–99)

## 2020-11-30 LAB — LIPASE, BLOOD: Lipase: 19 U/L (ref 11–51)

## 2020-11-30 LAB — LACTATE DEHYDROGENASE: LDH: 473 U/L — ABNORMAL HIGH (ref 98–192)

## 2020-11-30 LAB — MAGNESIUM: Magnesium: 1.5 mg/dL — ABNORMAL LOW (ref 1.7–2.4)

## 2020-11-30 LAB — LACTIC ACID, PLASMA: Lactic Acid, Venous: 1.3 mmol/L (ref 0.5–1.9)

## 2020-11-30 LAB — PHOSPHORUS: Phosphorus: 2.7 mg/dL (ref 2.5–4.6)

## 2020-11-30 MED ORDER — MAGNESIUM SULFATE 2 GM/50ML IV SOLN
2.0000 g | Freq: Once | INTRAVENOUS | Status: AC
  Administered 2020-11-30: 2 g via INTRAVENOUS
  Filled 2020-11-30: qty 50

## 2020-11-30 MED ORDER — INSULIN ASPART 100 UNIT/ML ~~LOC~~ SOLN
0.0000 [IU] | Freq: Three times a day (TID) | SUBCUTANEOUS | Status: DC
  Administered 2020-11-30 (×2): 15 [IU] via SUBCUTANEOUS

## 2020-11-30 MED ORDER — BARICITINIB 2 MG PO TABS
4.0000 mg | ORAL_TABLET | Freq: Every day | ORAL | Status: DC
  Filled 2020-11-30: qty 2

## 2020-11-30 MED ORDER — METHYLPREDNISOLONE SODIUM SUCC 125 MG IJ SOLR
1.0000 mg/kg | Freq: Two times a day (BID) | INTRAMUSCULAR | Status: DC
  Administered 2020-11-30 – 2020-12-01 (×2): 111.25 mg via INTRAVENOUS
  Filled 2020-11-30 (×3): qty 2

## 2020-11-30 MED ORDER — INSULIN GLARGINE 100 UNIT/ML ~~LOC~~ SOLN
30.0000 [IU] | Freq: Every day | SUBCUTANEOUS | Status: DC
  Filled 2020-11-30 (×2): qty 0.3

## 2020-11-30 MED ORDER — BARICITINIB 1 MG PO TABS
4.0000 mg | ORAL_TABLET | Freq: Every day | ORAL | Status: DC
  Administered 2020-11-30: 4 mg via ORAL
  Filled 2020-11-30: qty 2

## 2020-11-30 MED ORDER — INSULIN ASPART 100 UNIT/ML ~~LOC~~ SOLN: 0.0000 [IU] | Freq: Every day | SUBCUTANEOUS | Status: DC

## 2020-11-30 NOTE — ED Notes (Signed)
Lunch Tray Ordered @ 1032. 

## 2020-11-30 NOTE — ED Notes (Signed)
Attempted report x1. 

## 2020-11-30 NOTE — Progress Notes (Signed)
[facility] Progress Note  Patient: Sandra Ferrell    TKZ:[id number]  DOA: 11/20/2020     Date of Service: the patient was seen and examined on 11/30/2020  Brief hospital course: Hospital history of HTN, HLD, type II DM, COPD, anxiety, chronic diastolic CHF presents with complaints of confusion and shortness of breath.  Found to have COVID-19 pneumonia with encephalopathy Currently plan is continue current medication.  Assessment and Plan: 1. Acute Hypoxic respiratory failure, POA, 63% on room air on admission.  Acute COVID-19 Viral Pneumonia Severe sepsis, POA with fever, tachypnea and tachycardia CXR: hazy bilateral peripheral opacities Oxygen requirement: NRB-91% CRP: 20.3-21.9 Remdesivir: Initiated on 1/25 Steroids: Initiated on Decadron, currently on Solu-Medrol 1/25 Baricitinib/Actemra: Initiated on 1/25 after discussion with the family.  No contraindication provided by family. Antibiotics: Not indicated right now DVT Prophylaxis: enoxaparin (LOVENOX) injection 40 mg Start: 11/30/2020 1600  Prone positioning and incentive spirometer use recommended.  Overall plan: Condition guarded primarily secondary to encephalopathy.  Monitor Isolation duration: First positive test date 12/01/2020 The treatment plan and use of medications and known side effects were discussed with patient/family. It was clearly explained that complete risks and long-term side effects are unknown. Patient/family agree with the treatment plan.    2.  Acute metabolic encephalopathy At baseline patient is alert awake and oriented.  Able to functional without any confusion. Currently repeating some questions, not oriented to time and place. Likely delirium in the setting of acute infection. Holding psychotropic medication. Able to tolerate p.o. diet. CT head unremarkable.  No focal deficit. Will get metabolic work-up.  Monitor.  3.  Abdominal pain. X-ray unremarkable.  Monitor.  Will initiate  baricitinib.  Sandra Ferrell reports no prior history of diverticulitis or bowel perforation. Lipase level normal Lactic acid normal.  4.  Type II DM, uncontrolled with hyperglycemia with HLD Continuing Lantus.  Increasing to 35 units. Continue resistant sliding scale. Anion gap elevated on admission.  Currently normal.  Monitor. Continuing Lipitor.  5.  Chronic diastolic CHF Not volume overloaded. Monitor.  6.  History of COPD Continue inhalers. Does not appear to be in exacerbation right now.  7.  Morbid obesity Body mass index is 51.14 kg/m.  Placing the patient at high risk for poor outcome.  Diet: Cardiac diet DVT Prophylaxis:   enoxaparin (LOVENOX) injection 40 mg Start: 11/07/2020 1600    Advance goals of care discussion: Full code  Family Communication: no family was present at bedside, at the time of interview.  Discussed with son on the phone. Opportunity was given to ask question and all questions were answered satisfactorily.   Disposition:  Status is: Inpatient  Remains inpatient appropriate because:Inpatient level of care appropriate due to severity of illness   Dispo: The patient is from: Home              Anticipated d/c is to: SNF              Anticipated d/c date is: > 3 days              Patient currently is not medically stable to d/c.   Difficult to place patient No        Subjective: No nausea no vomiting.  No fever no chills.  Reports abdominal pain.  Somewhat confused.  Continues to have shortness of breath.  Physical Exam:  General: Appear in mild distress, no Rash; Oral Mucosa Clear, moist. no Abnormal Neck Mass Or lumps, Conjunctiva normal  Cardiovascular: S1 and S2  Present, no Murmur, Respiratory: increased respiratory effort, Bilateral Air entry present and bilateral  Crackles, no wheezes Abdomen: Bowel Sound present, Soft and mild diffuse tenderness Extremities: trace Pedal edema Neurology: alert and oriented to time, place, and  person affect appropriate. no new focal deficit Gait not checked due to patient safety concerns    Vitals:   11/30/20 1030 11/30/20 1230 11/30/20 1400 11/30/20 1426  BP: (!) 126/59 121/65 127/68 140/75  Pulse: (!) 122 (!) 111 (!) 115 (!) 118  Resp: (!) 23 (!) 22 19 19   Temp:   98.3 F (36.8 C) 97.8 F (36.6 C)  TempSrc:   Oral Oral  SpO2: 93% 96% 95% 93%  Weight:        Intake/Output Summary (Last 24 hours) at 11/30/2020 1849 Last data filed at 11/30/2020 1305 Gross per 24 hour  Intake 150 ml  Output -  Net 150 ml   Filed Weights   11/30/20 0723  Weight: 111 kg    Data Reviewed: I have personally reviewed and interpreted daily labs, tele strips, imaging. I reviewed all nursing notes, pharmacy notes, vitals, pertinent old records I have discussed plan of care as described above with RN and patient/family.  CBC: Recent Labs  Lab 12-03-2020 1250 12-03-20 1301 11/30/20 0342 11/30/20 0415  WBC 7.4  --  6.3  --   NEUTROABS 6.0  --  5.0  --   HGB 12.0 11.6* 12.8 11.2*  HCT 37.9 34.0* 37.2 33.0*  MCV 84.4  --  82.5  --   PLT 434*  --  442*  --    Basic Metabolic Panel: Recent Labs  Lab Dec 03, 2020 1250 12/03/2020 1301 11/30/20 0342 11/30/20 0415 11/30/20 0751  NA 140 142 141 141 140  K 3.8 3.8 4.0 3.6 3.7  CL 107 109 108  --  107  CO2 17*  --  17*  --  20*  GLUCOSE 304* 296* 352*  --  307*  BUN 9 9 14   --  13  CREATININE 0.75 0.50 0.72  --  0.62  CALCIUM 8.3*  --  8.6*  --  8.5*  MG 1.5*  --  1.5*  --   --   PHOS  --   --  2.7  --   --     Studies: CT HEAD WO CONTRAST  Result Date: 11/30/2020 CLINICAL DATA:  Mental status changes EXAM: CT HEAD WITHOUT CONTRAST TECHNIQUE: Contiguous axial images were obtained from the base of the skull through the vertex without intravenous contrast. COMPARISON:  12/31/2019 FINDINGS: Brain: Minor white matter microvascular ischemic changes about lateral ventricles throughout both cerebral hemispheres. No acute intracranial  hemorrhage infarction, midline shift hydrocephalus, or extra-axial collection. No focal mass effect. Cisterns are patent. No cerebellar abnormality. Vascular: No hyperdense vessel or unexpected calcification. Skull: Normal. Negative for fracture or focal lesion. Sinuses/Orbits: Orbits are unremarkable. Chronic maxillary sinus disease bilaterally. Other: None. IMPRESSION: Minor chronic white matter microvascular changes. No acute intracranial abnormality by noncontrast CT. Electronically Signed   By: Jerilynn Mages.  Shick M.D.   On: 11/30/2020 13:48   DG Abd Portable 1V  Result Date: 11/30/2020 CLINICAL DATA:  Abdominal pain, history of COVID-19 positivity EXAM: PORTABLE ABDOMEN - 1 VIEW COMPARISON:  10/08/2020 FINDINGS: Postsurgical changes are noted in the left femur and lumbar spine. Mild degenerative changes of lumbar spine are seen. Scattered large and small bowel gas is noted. No free air is seen. Changes of prior cholecystectomy are noted. IMPRESSION: No acute abnormality noted. Electronically  Signed   By: Inez Catalina M.D.   On: 11/30/2020 09:57    Scheduled Meds: . baricitinib  4 mg Oral Daily  . enoxaparin (LOVENOX) injection  40 mg Subcutaneous Q24H  . insulin aspart  0-20 Units Subcutaneous TID WC  . insulin aspart  0-5 Units Subcutaneous QHS  . insulin glargine  30 Units Subcutaneous QHS  . Ipratropium-Albuterol  1 puff Inhalation Q6H WA  . linagliptin  5 mg Oral Daily  . methylPREDNISolone (SOLU-MEDROL) injection  1 mg/kg Intravenous BID   Continuous Infusions: . remdesivir 100 mg in NS 100 mL Stopped (11/30/20 1305)   PRN Meds: albuterol, LORazepam  Time spent: 35 minutes  Author: Berle Mull, MD [facility] 11/30/2020 6:49 PM  To reach On-call, see care teams to locate the attending and reach out via [email or web address]. Between 7PM-7AM, please contact night-coverage If you still have difficulty reaching the attending provider, please page the [facility] (Director on Call) for [facility] on amion for assistance.

## 2020-11-30 NOTE — Plan of Care (Signed)
  Problem: Safety: Goal: Non-violent Restraint(s) 11/30/2020 1933 by Thressa Sheller, RN Outcome: Progressing 11/30/2020 1933 by Thressa Sheller, RN Outcome: Progressing   Problem: Education: Goal: Knowledge of General Education information will improve Description: Including pain rating scale, medication(s)/side effects and non-pharmacologic comfort measures 11/30/2020 1933 by Thressa Sheller, RN Outcome: Progressing 11/30/2020 1933 by Thressa Sheller, RN Outcome: Progressing   Problem: Health Behavior/Discharge Planning: Goal: Ability to manage health-related needs will improve 11/30/2020 1933 by Thressa Sheller, RN Outcome: Progressing 11/30/2020 1933 by Thressa Sheller, RN Outcome: Progressing   Problem: Clinical Measurements: Goal: Ability to maintain clinical measurements within normal limits will improve 11/30/2020 1933 by Thressa Sheller, RN Outcome: Progressing 11/30/2020 1933 by Thressa Sheller, RN Outcome: Progressing Goal: Will remain free from infection 11/30/2020 1933 by Thressa Sheller, RN Outcome: Progressing 11/30/2020 1933 by Thressa Sheller, RN Outcome: Progressing Goal: Diagnostic test results will improve 11/30/2020 1933 by Thressa Sheller, RN Outcome: Progressing 11/30/2020 1933 by Thressa Sheller, RN Outcome: Progressing Goal: Respiratory complications will improve 11/30/2020 1933 by Thressa Sheller, RN Outcome: Progressing 11/30/2020 1933 by Thressa Sheller, RN Outcome: Progressing Goal: Cardiovascular complication will be avoided 11/30/2020 1933 by Thressa Sheller, RN Outcome: Progressing 11/30/2020 1933 by Thressa Sheller, RN Outcome: Progressing   Problem: Activity: Goal: Risk for activity intolerance will decrease 11/30/2020 1933 by Thressa Sheller, RN Outcome: Progressing 11/30/2020 1933 by Thressa Sheller, RN Outcome: Progressing   Problem: Nutrition: Goal: Adequate nutrition will be  maintained 11/30/2020 1933 by Thressa Sheller, RN Outcome: Progressing 11/30/2020 1933 by Thressa Sheller, RN Outcome: Progressing   Problem: Coping: Goal: Level of anxiety will decrease 11/30/2020 1933 by Thressa Sheller, RN Outcome: Progressing 11/30/2020 1933 by Thressa Sheller, RN Outcome: Progressing   Problem: Elimination: Goal: Will not experience complications related to bowel motility 11/30/2020 1933 by Thressa Sheller, RN Outcome: Progressing 11/30/2020 1933 by Thressa Sheller, RN Outcome: Progressing Goal: Will not experience complications related to urinary retention 11/30/2020 1933 by Thressa Sheller, RN Outcome: Progressing 11/30/2020 1933 by Thressa Sheller, RN Outcome: Progressing   Problem: Pain Managment: Goal: General experience of comfort will improve 11/30/2020 1933 by Thressa Sheller, RN Outcome: Progressing 11/30/2020 1933 by Thressa Sheller, RN Outcome: Progressing   Problem: Safety: Goal: Ability to remain free from injury will improve 11/30/2020 1933 by Thressa Sheller, RN Outcome: Progressing 11/30/2020 1933 by Thressa Sheller, RN Outcome: Progressing   Problem: Skin Integrity: Goal: Risk for impaired skin integrity will decrease 11/30/2020 1933 by Thressa Sheller, RN Outcome: Progressing 11/30/2020 1933 by Thressa Sheller, RN Outcome: Progressing

## 2020-11-30 NOTE — Progress Notes (Signed)
[facility] Progress Note Patient Name: Sandra Ferrell DOB: 1961/05/05 MRN: [id number]   Date of Service  11/30/2020  HPI/Events of Note  Patient with Covid pneumonia who has had a sudden change in her mental status along with left arm weakness. Saturation 90 %.  eICU Interventions  Code stroke called, stat ABG ordered.        [name] Teneil Shiller 11/30/2020, 11:16 PM

## 2020-11-30 NOTE — Progress Notes (Signed)
ABG  7.42 35 73 23.2

## 2020-11-30 NOTE — Significant Event (Signed)
Rapid Response Event Note   Reason for Call :  Respiratory distress and SpO2-88%.  Per RN, pt went into respiratory distress and dropped SpO2 to 88% on 5L HFNC and NRB. Pt wasn't recovering her SpO2 so RRT was called.   Pt had recovered by the time RRT arrived.   Initial Focused Assessment:  Pt lying in bed with eyes closed, in no distress. Pt will awaken easily, is alert and oriented, and able to answer questions. She currently c/o no SOB/chest pain. Pt said earlier she got very anxious because she felt like she couldn't breathe. She says she feels better now. Lungs decreased t/o. Skin warm and dry.  HR-120, BP-117/80, RR-21, SpO2-93% on 5L HFNC/NRB.   During exam, pt kept pulling off NRB mask because she doesn't like it. HFNC increased to 15L and NRB taken off. SpO2 maintaining at 93-94% on 15L HFNC.  Pt educated regarding deep breathing techniques and benefit of proning or lying on side to help lungs expand and to help with oxygenation. I also explained to pt that we may need to put NRB mask back on depending on her oxygen level.   Interventions:  None at this time.  Plan of Care:  There is no SpO2 goal ordered for pt-spoke with RN about asking MD for one. Also spoke with RN about possibly having to add NRB back if pt gets into distress/SpO2 drops again. Pt has ativan q4h prn for anxiety-She doesn't seem to need it right now but it may be beneficial later. Continue to monitor pt closely. Call RRT if further assistance needed.    Event Summary:   MD Notified:  Call Time:1836 Arrival NIOE:[id number] End Time:1901  Dillard Essex, RN

## 2020-11-30 NOTE — Consult Note (Incomplete)
Referring Physician: Dr. Posey Pronto    Chief Complaint: Acute onset of aphasia, left facial droop and left hemiplegia  HPI: Sandra Ferrell is an 60 y.o. female with a PMHx with CHF, chronic pain syndrome, COPD, depressive disorder, left femur fracture, nephrolitia who was admitted***  LSN: *** tPA Given: {yes no:315493::"Yes"} MRS: 4. Unable to walk since injury in 2019. Wheelchair bound at baseline.   Past Medical History:  Diagnosis Date  . Allergic rhinitis, cause unspecified   . Anemia   . Anxiety state, unspecified   . Backache, unspecified   . Carpal tunnel syndrome, right    Bilateral  . CHF (congestive heart failure) ([facility])    unspecified , patient denies  . Chronic pain syndrome   . Constipation   . COPD (chronic obstructive pulmonary disease) ([facility] [facility])   . Cough   . Depressive disorder, not elsewhere classified    managed with medications  . Difficulty in walking   . Displaced intertrochanteric fracture of left femur ([facility])   . Esophageal reflux   . Extrinsic asthma with exacerbation   . Fibromyalgia   . Heart murmur    "a small one"  . History of kidney stones   . Hyperlipidemia   . Hypertension   . Muscle weakness (generalized)   . Nicotine dependence, cigarettes, uncomplicated   . Osteoarthrosis, unspecified whether generalized or localized, unspecified site   . Other abnormalities of gait and mobility   . Other and unspecified hyperlipidemia   . Other irritable bowel syndrome   . Other screening mammogram   . Personal history of unspecified urinary disorder   . Pneumonia   . Pressure ulcer of sacral region   . Routine general medical examination at a health care facility   . Routine gynecological examination   . Tobacco use disorder   . Type II or unspecified type diabetes mellitus without mention of complication, not stated as uncontrolled    Type II  . Unspecified fall, subsequent encounter   . Unspecified sleep apnea   . Urinary tract infection   .  Vaginitis   . Wears glasses   . Wheezing     Past Surgical History:  Procedure Laterality Date  . ANTERIOR CERVICAL DECOMP/DISCECTOMY FUSION N/A 01/19/2015   Procedure: ANTERIOR CERVICAL DECOMPRESSION/DISCECTOMY FUSION 2 LEVELS;  Surgeon: Phylliss Bob, MD;  Location: [facility];  Service: Orthopedics;  Laterality: N/A;  Anterior cervical decompression fusion, cervical 5-6, cervical 6-7 with instrumentation and allograft  . ANTERIOR CERVICAL DECOMP/DISCECTOMY FUSION N/A 05/23/2017   Procedure: ANTERIOR CERVICAL DECOMPRESSION FUSION, CERVICAL 4-5 WITH INSTRUMENTATION AND ALLOGRAFT;  Surgeon: Phylliss Bob, MD;  Location: [facility];  Service: Orthopedics;  Laterality: N/A;  ANTERIOR CERVICAL DECOMPRESSION FUSION, CERVICAL 4-5 WITH INSTRUMENTATION AND ALLOGRAFT; REQUEST 2 HOURS AND FLIP ROOM  . APPENDECTOMY  1973  . BACK SURGERY     Lumbar- "bone graft"  . CHOLECYSTECTOMY  08/2006  . COLONOSCOPY    . HARDWARE REMOVAL Left 08/20/2018   Procedure: HARDWARE REMOVAL FROM LEFT KNEE;  Surgeon: Milly Jakob, MD;  Location[facility];  Service: Orthopedics;  Laterality: Left;  . HARDWARE REMOVAL Left 05/29/2019   Procedure: REMOVAL OF  LEFT FEMUR HARDWARE;  Surgeon: Shona Needles, MD;  Location: [facility];  Service: Orthopedics;  Laterality: Left;  . ORIF FEMUR FRACTURE Left 06/29/2018   Procedure: OPEN REDUCTION INTERNAL FIXATION (ORIF) DISTAL FEMUR FRACTURE;  Surgeon: Milly Jakob, MD;  Location: [facility];  Service: Orthopedics;  Laterality: Left;  . ORIF FEMUR FRACTURE Left  05/29/2019   Procedure: REPAIR OF LEFT FEMUR NONUNION;  Surgeon: Shona Needles, MD;  Location: [facility];  Service: Orthopedics;  Laterality: Left;  . TRANSTHORACIC ECHOCARDIOGRAM  02/2011   mild LVH, nl EF, mild diastolic dysfunction, no wall motion abnl    Family History  Problem Relation Age of Onset  . Stroke Father   . Colon cancer Cousin    Social History:  reports that she quit smoking about 2 years ago. Her smoking use included  cigarettes. She has a 35.00 pack-year smoking history. She has never used smokeless tobacco. She reports that she does not drink alcohol and does not use drugs.  Allergies:  Allergies  Allergen Reactions  . Benazepril Anaphylaxis, Swelling and Other (See Comments)    Angioedema, throat swelling  . Diclofenac Sodium Other (See Comments)    GI bleed  . Fluticasone-Salmeterol Hives, Itching and Swelling    Tongue was swollen  . Nsaids Other (See Comments)    Rectal bleeding  . Penicillins Hives and Rash    Did it involve swelling of the face/tongue/throat, SOB, or low BP? No Did it involve sudden or severe rash/hives, skin peeling, or any reaction on the inside of your mouth or nose? No Did you need to seek medical attention at a hospital or doctor's office? No When did it last happen?5 - 6 years If all above answers are "NO", may proceed with cephalosporin use.   . Pioglitazone Swelling    Legs and feet  . Aspirin Other (See Comments)    Burns stomach  . Morphine And Related Itching, Rash and Other (See Comments)    Rash and itching with morphine Patient states she is able to tolerate tramadol without any problems.  . Propoxyphene Other (See Comments)    Darvocet - Headache    Medications: {medication reviewed/display:3041432}  ROS: ***  Physical Examination: Blood pressure 117/80, pulse (!) 116, temperature (!) 97.5 F (36.4 C), temperature source Oral, resp. rate 20, weight 111 kg, SpO2 91 %.  Neurologic Examination: ***  Results for orders placed or performed during the hospital encounter of 11/05/2020 (from the past 48 hour(s))  SARS Coronavirus 2 by RT PCR (hospital order, performed in [facility] hospital lab) Nasopharyngeal Nasopharyngeal Swab     Status: Abnormal   Collection Time: 11/28/2020 12:23 PM   Specimen: Nasopharyngeal Swab  Result Value Ref Range   SARS Coronavirus 2 POSITIVE (A) NEGATIVE    Comment: emailed L. Berdik RN 14:20 12/01/2020  ([email or web address]) (NOTE) SARS-CoV-2 target nucleic acids are DETECTED  SARS-CoV-2 RNA is generally detectable in upper respiratory specimens  during the acute phase of infection.  Positive results are indicative  of the presence of the identified virus, but do not rule out bacterial infection or co-infection with other pathogens not detected by the test.  Clinical correlation with patient history and  other diagnostic information is necessary to determine patient infection status.  The expected result is negative.  Fact Sheet for Patients:   StrictlyIdeas.no   Fact Sheet for Healthcare Providers:   BankingDealers.co.za    This test is not yet approved or cleared by the [place] FDA and  has been authorized for detection and/or diagnosis of SARS-CoV-2 by FDA under an Emergency Use Authorization (EUA).  This EUA will remain in effect (meaning this test can be used) for the duration of  the  COVID-19 declaration under Section 564(b)(1) of the Act, 21 U.S.C. section 360-bbb-3(b)(1), unless the authorization is terminated or revoked sooner.  Performed at [facility], [place] [address] [place], [place] [address]   Blood Culture (routine x 2)     Status: None (Preliminary result)   Collection Time: 11/08/2020 12:23 PM   Specimen: BLOOD  Result Value Ref Range   Specimen Description BLOOD RIGHT ANTECUBITAL    Special Requests      BOTTLES DRAWN AEROBIC AND ANAEROBIC Blood Culture results may not be optimal due to an inadequate volume of blood received in culture bottles   Culture      NO GROWTH < 24 HOURS Performed at [facility] [address], [place], [place] [address]    Report Status PENDING   Blood Culture (routine x 2)     Status: None (Preliminary result)   Collection Time: 11/28/2020 12:28 PM   Specimen: BLOOD LEFT HAND  Result Value Ref Range   Specimen Description BLOOD LEFT HAND    Special Requests      BOTTLES  DRAWN AEROBIC AND ANAEROBIC Blood Culture results may not be optimal due to an inadequate volume of blood received in culture bottles   Culture      NO GROWTH < 24 HOURS Performed at [facility], [place] [address], [place], [place] [address]    Report Status PENDING   Lactic acid, plasma     Status: None   Collection Time: 11/09/2020 12:50 PM  Result Value Ref Range   Lactic Acid, Venous 0.9 0.5 - 1.9 mmol/L    Comment: Performed at [facility], [place] [address] [place], [place] [address]  CBC WITH DIFFERENTIAL     Status: Abnormal   Collection Time: 11/16/2020 12:50 PM  Result Value Ref Range   WBC 7.4 4.0 - 10.5 K/uL   RBC 4.49 3.87 - 5.11 MIL/uL   Hemoglobin 12.0 12.0 - 15.0 g/dL   HCT 37.9 36.0 - 46.0 %   MCV 84.4 80.0 - 100.0 fL   MCH 26.7 26.0 - 34.0 pg   MCHC 31.7 30.0 - 36.0 g/dL   RDW 13.9 11.5 - 15.5 %   Platelets 434 (H) 150 - 400 K/uL   nRBC 0.0 0.0 - 0.2 %   Neutrophils Relative % 80 %   Neutro Abs 6.0 1.7 - 7.7 K/uL   Lymphocytes Relative 14 %   Lymphs Abs 1.0 0.7 - 4.0 K/uL   Monocytes Relative 5 %   Monocytes Absolute 0.4 0.1 - 1.0 K/uL   Eosinophils Relative 0 %   Eosinophils Absolute 0.0 0.0 - 0.5 K/uL   Basophils Relative 0 %   Basophils Absolute 0.0 0.0 - 0.1 K/uL   Immature Granulocytes 1 %   Abs Immature Granulocytes 0.06 0.00 - 0.07 K/uL    Comment: Performed at [facility] [address], [place],  [address]  Comprehensive metabolic panel     Status: Abnormal   Collection Time: 12/01/2020 12:50 PM  Result Value Ref Range   Sodium 140 135 - 145 mmol/L   Potassium 3.8 3.5 - 5.1 mmol/L   Chloride 107 98 - 111 mmol/L   CO2 17 (L) 22 - 32 mmol/L   Glucose, Bld 304 (H) 70 - 99 mg/dL    Comment: Glucose reference range applies only to samples taken after fasting for at least 8 hours.   BUN 9 6 - 20 mg/dL   Creatinine, Ser 0.75 0.44 - 1.00 mg/dL   Calcium 8.3 (L) 8.9 - 10.3 mg/dL   Total Protein 6.9 6.5 -  8.1 g/dL   Albumin 2.1 (L)  3.5 - 5.0 g/dL   AST 28 15 - 41 U/L   ALT 21 0 - 44 U/L   Alkaline Phosphatase 102 38 - 126 U/L   Total Bilirubin 1.1 0.3 - 1.2 mg/dL   GFR, Estimated >60 >60 mL/min    Comment: (NOTE) Calculated using the CKD-EPI Creatinine Equation (2021)    Anion gap 16 (H) 5 - 15    Comment: Performed at [facility] [address], [place], [place] [address]  D-dimer, quantitative     Status: Abnormal   Collection Time: 11/15/2020 12:50 PM  Result Value Ref Range   D-Dimer, Quant 3.28 (H) 0.00 - 0.50 ug/mL-FEU    Comment: (NOTE) At the manufacturer cut-off value of 0.5 g/mL FEU, this assay has a negative predictive value of 95-100%.This assay is intended for use in conjunction with a clinical pretest probability (PTP) assessment model to exclude pulmonary embolism (PE) and deep venous thrombosis (DVT) in outpatients suspected of PE or DVT. Results should be correlated with clinical presentation. Performed at [facility], [place] [address], [place], [place] [address]   Procalcitonin     Status: None   Collection Time: 12/03/2020 12:50 PM  Result Value Ref Range   Procalcitonin <0.10 ng/mL    Comment:        Interpretation: PCT (Procalcitonin) <= 0.5 ng/mL: Systemic infection (sepsis) is not likely. Local bacterial infection is possible. (NOTE)       Sepsis PCT Algorithm           Lower Respiratory Tract                                      Infection PCT Algorithm    ----------------------------     ----------------------------         PCT < 0.25 ng/mL                PCT < 0.10 ng/mL          Strongly encourage             Strongly discourage   discontinuation of antibiotics    initiation of antibiotics    ----------------------------     -----------------------------       PCT 0.25 - 0.50 ng/mL            PCT 0.10 - 0.25 ng/mL               OR       >80% decrease in PCT            Discourage initiation of                                            antibiotics      Encourage  discontinuation           of antibiotics    ----------------------------     -----------------------------         PCT >= 0.50 ng/mL              PCT 0.26 - 0.50 ng/mL               AND        <80% decrease in PCT  Encourage initiation of                                             antibiotics       Encourage continuation           of antibiotics    ----------------------------     -----------------------------        PCT >= 0.50 ng/mL                  PCT > 0.50 ng/mL               AND         increase in PCT                  Strongly encourage                                      initiation of antibiotics    Strongly encourage escalation           of antibiotics                                     -----------------------------                                           PCT <= 0.25 ng/mL                                                 OR                                        > 80% decrease in PCT                                      Discontinue / Do not initiate                                             antibiotics  Performed at [facility], [address], [place], [place] [address]   Lactate dehydrogenase     Status: Abnormal   Collection Time: 11/26/2020 12:50 PM  Result Value Ref Range   LDH 444 (H) 98 - 192 U/L    Comment: Performed at [facility], [place] [address], [place], [place] [address]  Ferritin     Status: Abnormal   Collection Time: 11/21/2020 12:50 PM  Result Value Ref Range   Ferritin 778 (H) 11 - 307 ng/mL    Comment: Performed at [facility], [place] [address], [place], [place] [address]  Triglycerides     Status: None   Collection Time: 11/20/2020 12:50 PM  Result  Value Ref Range   Triglycerides 130 <150 mg/dL    Comment: Performed at [facility] [address], [place], [place] [address]  Fibrinogen     Status: Abnormal   Collection Time: 12/04/2020 12:50 PM  Result Value Ref Range   Fibrinogen 763 (H) 210 - 475 mg/dL     Comment: Performed at [facility] [address] [place], [place] [address]  C-reactive protein     Status: Abnormal   Collection Time: 11/28/2020 12:50 PM  Result Value Ref Range   CRP 20.3 (H) <1.0 mg/dL    Comment: Performed at [facility], [place] [address], [place], [place] [address]  Magnesium     Status: Abnormal   Collection Time: 11/23/2020 12:50 PM  Result Value Ref Range   Magnesium 1.5 (L) 1.7 - 2.4 mg/dL    Comment: Performed at [facility] [address] [place] [address], [place], [place] [address]  I-stat chem 8, ed     Status: Abnormal   Collection Time: 11/24/2020  1:01 PM  Result Value Ref Range   Sodium 142 135 - 145 mmol/L    Comment: THIS TEST WAS ORDERED IN ERROR AND HAS BEEN CREDITED. Performed at [facility], [place] [address] [place], [place] [address]    Potassium 3.8 3.5 - 5.1 mmol/L   Chloride 109 98 - 111 mmol/L   BUN 9 6 - 20 mg/dL   Creatinine, Ser 0.50 0.44 - 1.00 mg/dL   Glucose, Bld 296 (H) 70 - 99 mg/dL    Comment: Glucose reference range applies only to samples taken after fasting for at least 8 hours.   Calcium, Ion 1.11 (L) 1.15 - 1.40 mmol/L   TCO2 19 (L) 22 - 32 mmol/L   Hemoglobin 11.6 (L) 12.0 - 15.0 g/dL   HCT 34.0 (L) 36.0 - 46.0 %  I-Stat beta hCG blood, ED     Status: None   Collection Time: 11/25/2020  1:03 PM  Result Value Ref Range   I-stat hCG, quantitative <5.0 <5 mIU/mL   Comment 3            Comment:   GEST. AGE      CONC.  (mIU/mL)   <=1 WEEK        5 - 50     2 WEEKS       50 - 500     3 WEEKS       100 - 10,000     4 WEEKS     1,000 - 30,000        FEMALE AND NON-PREGNANT FEMALE:     LESS THAN 5 mIU/mL   Urinalysis, Routine w reflex microscopic Urine, Random     Status: Abnormal   Collection Time: 11/21/2020  3:28 PM  Result Value Ref Range   Color, Urine YELLOW YELLOW   APPearance CLOUDY (A) CLEAR   Specific Gravity, Urine 1.018 1.005 - 1.030   pH 6.0 5.0 - 8.0   Glucose, UA >=500 (A) NEGATIVE mg/dL   Hgb urine  dipstick NEGATIVE NEGATIVE   Bilirubin Urine NEGATIVE NEGATIVE   Ketones, ur 80 (A) NEGATIVE mg/dL   Protein, ur >=300 (A) NEGATIVE mg/dL   Nitrite POSITIVE (A) NEGATIVE   Leukocytes,Ua LARGE (A) NEGATIVE   RBC / HPF 6-10 0 - 5 RBC/hpf   WBC, UA >50 (H) 0 - 5 WBC/hpf   Bacteria, UA MANY (A) NONE SEEN   Squamous Epithelial / LPF  0-5 0 - 5   Non Squamous Epithelial 6-10 (A) NONE SEEN    Comment: Performed at [facility], [place] [address], [place], [place] [address]  CBG monitoring, ED     Status: Abnormal   Collection Time: 11/07/2020 11:41 PM  Result Value Ref Range   Glucose-Capillary 326 (H) 70 - 99 mg/dL    Comment: Glucose reference range applies only to samples taken after fasting for at least 8 hours.  C-reactive protein     Status: Abnormal   Collection Time: 11/30/20  3:42 AM  Result Value Ref Range   CRP 21.9 (H) <1.0 mg/dL    Comment: Performed at [facility] [address] [place], [place] [address]  Ferritin     Status: Abnormal   Collection Time: 11/30/20  3:42 AM  Result Value Ref Range   Ferritin 988 (H) 11 - 307 ng/mL    Comment: Performed at [facility], [place] [address] [place], [place] [address]  Fibrinogen     Status: Abnormal   Collection Time: 11/30/20  3:42 AM  Result Value Ref Range   Fibrinogen 795 (H) 210 - 475 mg/dL    Comment: Performed at [facility] [address], [place], [place] [address]  Lactate dehydrogenase     Status: Abnormal   Collection Time: 11/30/20  3:42 AM  Result Value Ref Range   LDH 473 (H) 98 - 192 U/L    Comment: Performed at [facility], [place] [address], [place], [place] [address]  Magnesium     Status: Abnormal   Collection Time: 11/30/20  3:42 AM  Result Value Ref Range   Magnesium 1.5 (L) 1.7 - 2.4 mg/dL    Comment: Performed at [facility] [address], [place], [place] [address]  Phosphorus     Status: None   Collection Time: 11/30/20  3:42 AM  Result Value Ref Range    Phosphorus 2.7 2.5 - 4.6 mg/dL    Comment: Performed at [facility] [address], [place], [place] [address]  Comprehensive metabolic panel     Status: Abnormal   Collection Time: 11/30/20  3:42 AM  Result Value Ref Range   Sodium 141 135 - 145 mmol/L   Potassium 4.0 3.5 - 5.1 mmol/L   Chloride 108 98 - 111 mmol/L   CO2 17 (L) 22 - 32 mmol/L   Glucose, Bld 352 (H) 70 - 99 mg/dL    Comment: Glucose reference range applies only to samples taken after fasting for at least 8 hours.   BUN 14 6 - 20 mg/dL   Creatinine, Ser 0.72 0.44 - 1.00 mg/dL   Calcium 8.6 (L) 8.9 - 10.3 mg/dL   Total Protein 6.9 6.5 - 8.1 g/dL   Albumin 2.2 (L) 3.5 - 5.0 g/dL   AST 27 15 - 41 U/L   ALT 23 0 - 44 U/L   Alkaline Phosphatase 99 38 - 126 U/L   Total Bilirubin 1.1 0.3 - 1.2 mg/dL   GFR, Estimated >60 >60 mL/min    Comment: (NOTE) Calculated using the CKD-EPI Creatinine Equation (2021)    Anion gap 16 (H) 5 - 15    Comment: Performed at [facility], [place] [address] [place], [place] [address]  CBC with Differential/Platelet     Status: Abnormal   Collection Time: 11/30/20  3:42 AM  Result Value Ref Range   WBC 6.3 4.0 -  10.5 K/uL   RBC 4.51 3.87 - 5.11 MIL/uL   Hemoglobin 12.8 12.0 - 15.0 g/dL   HCT 37.2 36.0 - 46.0 %   MCV 82.5 80.0 - 100.0 fL   MCH 28.4 26.0 - 34.0 pg   MCHC 34.4 30.0 - 36.0 g/dL   RDW 13.7 11.5 - 15.5 %   Platelets 442 (H) 150 - 400 K/uL   nRBC 0.0 0.0 - 0.2 %   Neutrophils Relative % 80 %   Neutro Abs 5.0 1.7 - 7.7 K/uL   Lymphocytes Relative 16 %   Lymphs Abs 1.0 0.7 - 4.0 K/uL   Monocytes Relative 4 %   Monocytes Absolute 0.3 0.1 - 1.0 K/uL   Eosinophils Relative 0 %   Eosinophils Absolute 0.0 0.0 - 0.5 K/uL   Basophils Relative 0 %   Basophils Absolute 0.0 0.0 - 0.1 K/uL   nRBC 0 0 /100 WBC   Abs Immature Granulocytes 0.00 0.00 - 0.07 K/uL    Comment: Performed at [facility] [address], [place], [place] [address]  TSH     Status:  Abnormal   Collection Time: 11/30/20  3:42 AM  Result Value Ref Range   TSH 0.278 (L) 0.350 - 4.500 uIU/mL    Comment: Performed by a 3rd Generation assay with a functional sensitivity of <=0.01 uIU/mL. Performed at [facility], [place] [address] [place], [place] [address]   I-Stat arterial blood gas, ED     Status: Abnormal   Collection Time: 11/30/20  4:15 AM  Result Value Ref Range   pH, Arterial 7.442 7.350 - 7.450   pCO2 arterial 26.3 (L) 32.0 - 48.0 mmHg   pO2, Arterial 83 83.0 - 108.0 mmHg   Bicarbonate 18.0 (L) 20.0 - 28.0 mmol/L   TCO2 19 (L) 22 - 32 mmol/L   O2 Saturation 97.0 %   Acid-base deficit 5.0 (H) 0.0 - 2.0 mmol/L   Sodium 141 135 - 145 mmol/L   Potassium 3.6 3.5 - 5.1 mmol/L   Calcium, Ion 1.21 1.15 - 1.40 mmol/L   HCT 33.0 (L) 36.0 - 46.0 %   Hemoglobin 11.2 (L) 12.0 - 15.0 g/dL   Patient temperature 98.3 F    Sample type ARTERIAL   CBG monitoring, ED     Status: Abnormal   Collection Time: 11/30/20  7:18 AM  Result Value Ref Range   Glucose-Capillary 281 (H) 70 - 99 mg/dL    Comment: Glucose reference range applies only to samples taken after fasting for at least 8 hours.  Basic metabolic panel     Status: Abnormal   Collection Time: 11/30/20  7:51 AM  Result Value Ref Range   Sodium 140 135 - 145 mmol/L   Potassium 3.7 3.5 - 5.1 mmol/L   Chloride 107 98 - 111 mmol/L   CO2 20 (L) 22 - 32 mmol/L   Glucose, Bld 307 (H) 70 - 99 mg/dL    Comment: Glucose reference range applies only to samples taken after fasting for at least 8 hours.   BUN 13 6 - 20 mg/dL   Creatinine, Ser 0.62 0.44 - 1.00 mg/dL   Calcium 8.5 (L) 8.9 - 10.3 mg/dL   GFR, Estimated >60 >60 mL/min    Comment: (NOTE) Calculated using the CKD-EPI Creatinine Equation (2021)    Anion gap 13 5 - 15    Comment: Performed at [facility] [address] [place], [place] [address]  Lipase, blood  Status: None   Collection Time: 11/30/20  7:51 AM  Result Value Ref Range   Lipase  19 11 - 51 U/L    Comment: Performed at [facility], [place] [address], [place], [place] [address]  CBG monitoring, ED     Status: Abnormal   Collection Time: 11/30/20  1:07 PM  Result Value Ref Range   Glucose-Capillary 313 (H) 70 - 99 mg/dL    Comment: Glucose reference range applies only to samples taken after fasting for at least 8 hours.  MRSA PCR Screening     Status: None   Collection Time: 11/30/20  2:45 PM   Specimen: Nasal Mucosa; Nasopharyngeal  Result Value Ref Range   MRSA by PCR NEGATIVE NEGATIVE    Comment:        The GeneXpert MRSA Assay (FDA approved for NASAL specimens only), is one component of a comprehensive MRSA colonization surveillance program. It is not intended to diagnose MRSA infection nor to guide or monitor treatment for MRSA infections. Performed at [facility], [place] [address] [place], [place] [address]   Lactic acid, plasma     Status: None   Collection Time: 11/30/20  3:35 PM  Result Value Ref Range   Lactic Acid, Venous 1.3 0.5 - 1.9 mmol/L    Comment: Performed at [facility] [address], [place], [place] [address]  Glucose, capillary     Status: Abnormal   Collection Time: 11/30/20  4:27 PM  Result Value Ref Range   Glucose-Capillary 304 (H) 70 - 99 mg/dL    Comment: Glucose reference range applies only to samples taken after fasting for at least 8 hours.   Comment 1 Notify RN    Comment 2 Document in Chart   Glucose, capillary     Status: Abnormal   Collection Time: 11/30/20  8:19 PM  Result Value Ref Range   Glucose-Capillary 307 (H) 70 - 99 mg/dL    Comment: Glucose reference range applies only to samples taken after fasting for at least 8 hours.  Glucose, capillary     Status: Abnormal   Collection Time: 11/30/20 10:31 PM  Result Value Ref Range   Glucose-Capillary 290 (H) 70 - 99 mg/dL    Comment: Glucose reference range applies only to samples taken after fasting for at least 8 hours.   *Note: Due to a  large number of results and/or encounters for the requested time period, some results have not been displayed. A complete set of results can be found in Results Review.   CT HEAD WO CONTRAST  Result Date: 11/30/2020 CLINICAL DATA:  Mental status changes EXAM: CT HEAD WITHOUT CONTRAST TECHNIQUE: Contiguous axial images were obtained from the base of the skull through the vertex without intravenous contrast. COMPARISON:  12/31/2019 FINDINGS: Brain: Minor white matter microvascular ischemic changes about lateral ventricles throughout both cerebral hemispheres. No acute intracranial hemorrhage infarction, midline shift hydrocephalus, or extra-axial collection. No focal mass effect. Cisterns are patent. No cerebellar abnormality. Vascular: No hyperdense vessel or unexpected calcification. Skull: Normal. Negative for fracture or focal lesion. Sinuses/Orbits: Orbits are unremarkable. Chronic maxillary sinus disease bilaterally. Other: None. IMPRESSION: Minor chronic white matter microvascular changes. No acute intracranial abnormality by noncontrast CT. Electronically Signed   By: Jerilynn Mages.  Shick M.D.   On: 11/30/2020 13:48   DG Chest Port 1 View  Result Date: Dec 27, 2020 CLINICAL DATA:  Shortness of breath EXAM: PORTABLE CHEST 1 VIEW COMPARISON:  07/31/2019 FINDINGS: Cardiac shadow is  within normal limits. Patchy airspace opacity is noted bilaterally consistent with multifocal pneumonia. Correlate with COVID testing. No effusion is seen. Postsurgical changes in the cervical spine are noted. IMPRESSION: Increased bilateral airspace opacities consistent with multifocal pneumonia. Correlate with COVID-19 testing. Electronically Signed   By: Inez Catalina M.D.   On: 11/28/2020 12:47   DG Abd Portable 1V  Result Date: 11/30/2020 CLINICAL DATA:  Abdominal pain, history of COVID-19 positivity EXAM: PORTABLE ABDOMEN - 1 VIEW COMPARISON:  10/08/2020 FINDINGS: Postsurgical changes are noted in the left femur and lumbar spine.  Mild degenerative changes of lumbar spine are seen. Scattered large and small bowel gas is noted. No free air is seen. Changes of prior cholecystectomy are noted. IMPRESSION: No acute abnormality noted. Electronically Signed   By: Inez Catalina M.D.   On: 11/30/2020 09:57    Assessment: 60 y.o. female ***  Stroke Risk Factors - {Stroke risk factors:31118}  Plan: 1. HgbA1c, fasting lipid panel 2. MRI, MRA  of the brain without contrast 3. PT consult, OT consult, Speech consult 4. Echocardiogram 5. Carotid dopplers 6. Prophylactic therapy-{Vasm anticoagulants - no ufh:30974} 7. Risk factor modification 8. Telemetry monitoring 9. Frequent neuro checks    @Electronically  signed: Dr. Kerney Elbe 11/30/2020, 11:43 PM

## 2020-11-30 NOTE — Plan of Care (Signed)

## 2020-11-30 NOTE — ED Notes (Signed)
Patient transported to CT 

## 2020-11-30 NOTE — Progress Notes (Signed)
   11/30/20 1956  Assess: MEWS Score  Temp (!) 97.5 F (36.4 C)  BP 117/80  Pulse Rate (!) 116  ECG Heart Rate (!) 117  Resp 20  Level of Consciousness Alert  SpO2 91 %  O2 Device HFNC  Patient Activity (if Appropriate) In bed  O2 Flow Rate (L/min) 12 L/min  Assess: MEWS Score  MEWS Temp 0  MEWS Systolic 0  MEWS Pulse 2  MEWS RR 0  MEWS LOC 0  MEWS Score 2  MEWS Score Color Yellow  Assess: if the MEWS score is Yellow or Red  Were vital signs taken at a resting state? Yes  Focused Assessment No change from prior assessment  Early Detection of Sepsis Score *See Row Information* Low  MEWS guidelines implemented *See Row Information* Yes  Treat  Pain Scale 0-10  Pain Score 0  Neuro symptoms relieved by Rest  Take Vital Signs  Increase Vital Sign Frequency  Yellow: Q 2hr X 2 then Q 4hr X 2, if remains yellow, continue Q 4hrs  Escalate  MEWS: Escalate Yellow: discuss with charge nurse/RN and consider discussing with provider and RRT  Notify: Charge Nurse/RN  Name of Charge Nurse/RN Notified Jessica, RN  Date Charge Nurse/RN Notified 11/30/20  Time Charge Nurse/RN Notified 1956  Document  Patient Outcome Other (Comment) (stable)  Progress note created (see row info) Yes

## 2020-11-30 NOTE — Consult Note (Signed)
Referring Physician: Dr. Posey Pronto    Chief Complaint: Acute onset of aphasia, left facial droop and left hemiplegia  HPI: Sandra Ferrell is an 60 y.o. female with a PMHx with CHF, chronic pain syndrome, COPD, depressive disorder, left femur fracture, nephrolithiasis, HLD, HTN, nicotine dependence, sacral pressure ulcer and DM2 who was admitted to the ICU on 1/25 with severe Covid-19 pneumonia. She had altered mental status at the time of admission. Initial H and P documents that she had been experiencing 5 to 6 days of progressive SOB associated with nonproductive cough and recently developed an objective fever over the last 1 to 2 days, with reported Tmax over that time of 102.  Over the last 2 to 3 days, the patient's son also has noted the patient to be confused and agitated relative to her baseline mental status.   This evening, RN check revealed the patient to be acutely aphasic with left sided weakness, rightward eye deviation and left facial droop. A Code Stroke was called.   CT head shows minor chronic white matter microvascular changes, with no acute intracranial abnormality seen.    CTA of head and neck: 1. 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 Absent flow within the right M1 segment and distal right MCA branches, with relatively little if any collateralization within the right MCA distribution. 2. Bulky calcified plaque about the right bifurcation/proximal right ICA with associated stenosis of up to 30% by NASCET criteria. Superimposed focal filling defect at the proximal right ICA consistent with thrombus, likely due to an ulcerated and/or ruptured plaque. This is almost certainly the embolic source. 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 4. Extensive multifocal ground-glass opacity within the partially visualized lungs, consistent with history of COVID pneumonia.  Per husband, at her baseline she is wheelchair bound due to debility from a hip  fracture in 2019.   LSN: 8:00 PM tPA Given: Yes mRS: 4. Unable to walk since injury in 2019. Wheelchair bound at baseline.   Past Medical History:  Diagnosis Date  . Allergic rhinitis, cause unspecified   . Anemia   . Anxiety state, unspecified   . Backache, unspecified   . Carpal tunnel syndrome, right    Bilateral  . CHF (congestive heart failure) ([place])    unspecified , patient denies  . Chronic pain syndrome   . Constipation   . COPD (chronic obstructive pulmonary disease) ([place])   . Cough   . Depressive disorder, not elsewhere classified    managed with medications  . Difficulty in walking   . Displaced intertrochanteric fracture of left femur ([place])   . Esophageal reflux   . Extrinsic asthma with exacerbation   . Fibromyalgia   . Heart murmur    "a small one"  . History of kidney stones   . Hyperlipidemia   . Hypertension   . Muscle weakness (generalized)   . Nicotine dependence, cigarettes, uncomplicated   . Osteoarthrosis, unspecified whether generalized or localized, unspecified site   . Other abnormalities of gait and mobility   . Other and unspecified hyperlipidemia   . Other irritable bowel syndrome   . Other screening mammogram   . Personal history of unspecified urinary disorder   . Pneumonia   . Pressure ulcer of sacral region   . Routine general medical examination at a health care facility   . Routine gynecological examination   . Tobacco use disorder   . Type II or unspecified type diabetes mellitus without mention  of complication, not stated as uncontrolled    Type II  . Unspecified fall, subsequent encounter   . Unspecified sleep apnea   . Urinary tract infection   . Vaginitis   . Wears glasses   . Wheezing     Past Surgical History:  Procedure Laterality Date  . ANTERIOR CERVICAL DECOMP/DISCECTOMY FUSION N/A 01/19/2015   Procedure: ANTERIOR CERVICAL DECOMPRESSION/DISCECTOMY FUSION 2 LEVELS;  Surgeon: Phylliss Bob, MD;  Location: [place];   Service: Orthopedics;  Laterality: N/A;  Anterior cervical decompression fusion, cervical 5-6, cervical 6-7 with instrumentation and allograft  . ANTERIOR CERVICAL DECOMP/DISCECTOMY FUSION N/A 05/23/2017   Procedure: ANTERIOR CERVICAL DECOMPRESSION FUSION, CERVICAL 4-5 WITH INSTRUMENTATION AND ALLOGRAFT;  Surgeon: Phylliss Bob, MD;  Location: [place];  Service: Orthopedics;  Laterality: N/A;  ANTERIOR CERVICAL DECOMPRESSION FUSION, CERVICAL 4-5 WITH INSTRUMENTATION AND ALLOGRAFT; REQUEST 2 HOURS AND FLIP ROOM  . APPENDECTOMY  1973  . BACK SURGERY     Lumbar- "bone graft"  . CHOLECYSTECTOMY  08/2006  . COLONOSCOPY    . HARDWARE REMOVAL Left 08/20/2018   Procedure: HARDWARE REMOVAL FROM LEFT KNEE;  Surgeon: Milly Jakob, MD;  Location[facility];  Service: Orthopedics;  Laterality: Left;  . HARDWARE REMOVAL Left 05/29/2019   Procedure: REMOVAL OF  LEFT FEMUR HARDWARE;  Surgeon: Shona Needles, MD;  Location: [facility];  Service: Orthopedics;  Laterality: Left;  . ORIF FEMUR FRACTURE Left 06/29/2018   Procedure: OPEN REDUCTION INTERNAL FIXATION (ORIF) DISTAL FEMUR FRACTURE;  Surgeon: Milly Jakob, MD;  Location: [facility];  Service: Orthopedics;  Laterality: Left;  . ORIF FEMUR FRACTURE Left 05/29/2019   Procedure: REPAIR OF LEFT FEMUR NONUNION;  Surgeon: Shona Needles, MD;  Location: [facility] [place];  Service: Orthopedics;  Laterality: Left;  . TRANSTHORACIC ECHOCARDIOGRAM  02/2011   mild LVH, nl EF, mild diastolic dysfunction, no wall motion abnl    Family History  Problem Relation Age of Onset  . Stroke Father   . Colon cancer Cousin    Social History:  reports that she quit smoking about 2 years ago. Her smoking use included cigarettes. She has a 35.00 pack-year smoking history. She has never used smokeless tobacco. She reports that she does not drink alcohol and does not use drugs.  Allergies:  Allergies  Allergen Reactions  . Benazepril Anaphylaxis, Swelling and Other (See Comments)    Angioedema,  throat swelling  . Diclofenac Sodium Other (See Comments)    GI bleed  . Fluticasone-Salmeterol Hives, Itching and Swelling    Tongue was swollen  . Nsaids Other (See Comments)    Rectal bleeding  . Penicillins Hives and Rash    Did it involve swelling of the face/tongue/throat, SOB, or low BP? No Did it involve sudden or severe rash/hives, skin peeling, or any reaction on the inside of your mouth or nose? No Did you need to seek medical attention at a hospital or doctor's office? No When did it last happen?5 - 6 years If all above answers are "NO", may proceed with cephalosporin use.   . Pioglitazone Swelling    Legs and feet  . Aspirin Other (See Comments)    Burns stomach  . Morphine And Related Itching, Rash and Other (See Comments)    Rash and itching with morphine Patient states she is able to tolerate tramadol without any problems.  . Propoxyphene Other (See Comments)    Darvocet - Headache    Medications:  Scheduled: . baricitinib  4 mg Oral Daily  .  enoxaparin (LOVENOX) injection  40 mg Subcutaneous Q24H  . insulin aspart  0-20 Units Subcutaneous TID WC  . insulin aspart  0-5 Units Subcutaneous QHS  . insulin glargine  30 Units Subcutaneous QHS  . Ipratropium-Albuterol  1 puff Inhalation Q6H WA  . linagliptin  5 mg Oral Daily  . methylPREDNISolone (SOLU-MEDROL) injection  1 mg/kg Intravenous BID   Continuous: . alteplase     Followed by  . sodium chloride    . remdesivir 100 mg in NS 100 mL Stopped (11/30/20 1305)    ROS: Unable to obtain due to AMS.   Physical Examination: Blood pressure 117/80, pulse (!) 116, temperature (!) 97.5 F (36.4 C), temperature source Oral, resp. rate 20, weight 111 kg, SpO2 91 %.  HEENT: [place]/AT Lungs: Labored respirations Ext: No edema  Neurologic Examination: Mental Status: Awake with left hemineglect and dense receptive and expressive aphasia. No verbal output, but does groan to noxious stimuli. Not following any  commands. Rightward gaze deviation is present but briefly tracks to left. Becomes agitated during exam.  Cranial Nerves: II: No blink to threat on the left. Does consistently blink to threat on the right. PERRL.  III,IV, VI: No ptosis. Rightward gaze deviation is present but briefly tracks to left. Also able to overcome with oculocephalic maneuver.  V,VII: Prominent left facial droop noted when grimacing to noxious. Decreased responsiveness to left sided facial stimuli.  VIII: Unable to formally assess IX,X: Deferred testing of gag reflex due to dyspnea XI: Head preferentially rotated to the right XII: Unable to assess Motor/Sensory: RUE and RLE with full strength when reacting to noxious stimuli LUE 0/5 except for significant resistance to RN as patient postures with LUE during attempted insertion of IV LLE 1-2/5 to noxious Deep Tendon Reflexes:  2+ bilateral brachioradialis and patellar reflexes Plantars: Thrashes in response. Unable to clearly determine Babinski response Cerebellar: Unable to assess Gait: Unable to assess  Results for orders placed or performed during the hospital encounter of 12-27-2020 (from the past 48 hour(s))  SARS Coronavirus 2 by RT PCR (hospital order, performed in [facility] hospital lab) Nasopharyngeal Nasopharyngeal Swab     Status: Abnormal   Collection Time: 2020-12-27 12:23 PM   Specimen: Nasopharyngeal Swab  Result Value Ref Range   SARS Coronavirus 2 POSITIVE (A) NEGATIVE    Comment: emailed L. Berdik RN 14:20 2020-12-27 ([email or web address]) (NOTE) SARS-CoV-2 target nucleic acids are DETECTED  SARS-CoV-2 RNA is generally detectable in upper respiratory specimens  during the acute phase of infection.  Positive results are indicative  of the presence of the identified virus, but do not rule out bacterial infection or co-infection with other pathogens not detected by the test.  Clinical correlation with patient history and  other diagnostic information is necessary  to determine patient infection status.  The expected result is negative.  Fact Sheet for Patients:   StrictlyIdeas.no   Fact Sheet for Healthcare Providers:   BankingDealers.co.za    This test is not yet approved or cleared by the [place] FDA and  has been authorized for detection and/or diagnosis of SARS-CoV-2 by FDA under an Emergency Use Authorization (EUA).  This EUA will remain in effect (meaning this test can be used) for the duration of  the  COVID-19 declaration under Section 564(b)(1) of the Act, 21 U.S.C. section 360-bbb-3(b)(1), unless the authorization is terminated or revoked sooner.  Performed at [facility], [place] [address], [place], [place] [address]   Blood Culture (routine x  2)     Status: None (Preliminary result)   Collection Time: 12/05/2020 12:23 PM   Specimen: BLOOD  Result Value Ref Range   Specimen Description BLOOD RIGHT ANTECUBITAL    Special Requests      BOTTLES DRAWN AEROBIC AND ANAEROBIC Blood Culture results may not be optimal due to an inadequate volume of blood received in culture bottles   Culture      NO GROWTH < 24 HOURS Performed at [facility] [address] [place], [place] [address]    Report Status PENDING   Blood Culture (routine x 2)     Status: None (Preliminary result)   Collection Time: 12/05/2020 12:28 PM   Specimen: BLOOD LEFT HAND  Result Value Ref Range   Specimen Description BLOOD LEFT HAND    Special Requests      BOTTLES DRAWN AEROBIC AND ANAEROBIC Blood Culture results may not be optimal due to an inadequate volume of blood received in culture bottles   Culture      NO GROWTH < 24 HOURS Performed at [facility], [place] [address], [place], [place] [address]    Report Status PENDING   Lactic acid, plasma     Status: None   Collection Time: Dec 02, 2020 12:50 PM  Result Value Ref Range   Lactic Acid, Venous 0.9 0.5 - 1.9 mmol/L    Comment: Performed at  [facility], [place] [address], [place], [place] [address]  CBC WITH DIFFERENTIAL     Status: Abnormal   Collection Time: 11/21/2020 12:50 PM  Result Value Ref Range   WBC 7.4 4.0 - 10.5 K/uL   RBC 4.49 3.87 - 5.11 MIL/uL   Hemoglobin 12.0 12.0 - 15.0 g/dL   HCT 37.9 36.0 - 46.0 %   MCV 84.4 80.0 - 100.0 fL   MCH 26.7 26.0 - 34.0 pg   MCHC 31.7 30.0 - 36.0 g/dL   RDW 13.9 11.5 - 15.5 %   Platelets 434 (H) 150 - 400 K/uL   nRBC 0.0 0.0 - 0.2 %   Neutrophils Relative % 80 %   Neutro Abs 6.0 1.7 - 7.7 K/uL   Lymphocytes Relative 14 %   Lymphs Abs 1.0 0.7 - 4.0 K/uL   Monocytes Relative 5 %   Monocytes Absolute 0.4 0.1 - 1.0 K/uL   Eosinophils Relative 0 %   Eosinophils Absolute 0.0 0.0 - 0.5 K/uL   Basophils Relative 0 %   Basophils Absolute 0.0 0.0 - 0.1 K/uL   Immature Granulocytes 1 %   Abs Immature Granulocytes 0.06 0.00 - 0.07 K/uL    Comment: Performed at [facility] [address], [place], [place] [address]  Comprehensive metabolic panel     Status: Abnormal   Collection Time: 12/04/2020 12:50 PM  Result Value Ref Range   Sodium 140 135 - 145 mmol/L   Potassium 3.8 3.5 - 5.1 mmol/L   Chloride 107 98 - 111 mmol/L   CO2 17 (L) 22 - 32 mmol/L   Glucose, Bld 304 (H) 70 - 99 mg/dL    Comment: Glucose reference range applies only to samples taken after fasting for at least 8 hours.   BUN 9 6 - 20 mg/dL   Creatinine, Ser 0.75 0.44 - 1.00 mg/dL   Calcium 8.3 (L) 8.9 - 10.3 mg/dL   Total Protein 6.9 6.5 - 8.1 g/dL   Albumin 2.1 (L) 3.5 - 5.0 g/dL   AST 28 15 - 41  U/L   ALT 21 0 - 44 U/L   Alkaline Phosphatase 102 38 - 126 U/L   Total Bilirubin 1.1 0.3 - 1.2 mg/dL   GFR, Estimated >60 >60 mL/min    Comment: (NOTE) Calculated using the CKD-EPI Creatinine Equation (2021)    Anion gap 16 (H) 5 - 15    Comment: Performed at [facility] [address] [place], [place] [address]  D-dimer, quantitative     Status: Abnormal   Collection Time: 11/15/2020 12:50 PM   Result Value Ref Range   D-Dimer, Quant 3.28 (H) 0.00 - 0.50 ug/mL-FEU    Comment: (NOTE) At the manufacturer cut-off value of 0.5 g/mL FEU, this assay has a negative predictive value of 95-100%.This assay is intended for use in conjunction with a clinical pretest probability (PTP) assessment model to exclude pulmonary embolism (PE) and deep venous thrombosis (DVT) in outpatients suspected of PE or DVT. Results should be correlated with clinical presentation. Performed at [facility], [place] [address] [place], [place] [address]   Procalcitonin     Status: None   Collection Time: 12/04/2020 12:50 PM  Result Value Ref Range   Procalcitonin <0.10 ng/mL    Comment:        Interpretation: PCT (Procalcitonin) <= 0.5 ng/mL: Systemic infection (sepsis) is not likely. Local bacterial infection is possible. (NOTE)       Sepsis PCT Algorithm           Lower Respiratory Tract                                      Infection PCT Algorithm    ----------------------------     ----------------------------         PCT < 0.25 ng/mL                PCT < 0.10 ng/mL          Strongly encourage             Strongly discourage   discontinuation of antibiotics    initiation of antibiotics    ----------------------------     -----------------------------       PCT 0.25 - 0.50 ng/mL            PCT 0.10 - 0.25 ng/mL               OR       >80% decrease in PCT            Discourage initiation of                                            antibiotics      Encourage discontinuation           of antibiotics    ----------------------------     -----------------------------         PCT >= 0.50 ng/mL              PCT 0.26 - 0.50 ng/mL               AND        <80% decrease in PCT             Encourage initiation of  antibiotics       Encourage continuation           of antibiotics    ----------------------------     -----------------------------        PCT >=  0.50 ng/mL                  PCT > 0.50 ng/mL               AND         increase in PCT                  Strongly encourage                                      initiation of antibiotics    Strongly encourage escalation           of antibiotics                                     -----------------------------                                           PCT <= 0.25 ng/mL                                                 OR                                        > 80% decrease in PCT                                      Discontinue / Do not initiate                                             antibiotics  Performed at [facility], [address], [place], [place] [address]   Lactate dehydrogenase     Status: Abnormal   Collection Time: 11/22/2020 12:50 PM  Result Value Ref Range   LDH 444 (H) 98 - 192 U/L    Comment: Performed at [facility], [place] [address] [place], [place] [address]  Ferritin     Status: Abnormal   Collection Time: 11/21/2020 12:50 PM  Result Value Ref Range   Ferritin 778 (H) 11 - 307 ng/mL    Comment: Performed at [facility], [place] [address] [place], [place] [address]  Triglycerides     Status: None   Collection Time: 11/15/2020 12:50 PM  Result Value Ref Range   Triglycerides 130 <150 mg/dL    Comment: Performed at [facility] [address] [place], [place] [address]  Fibrinogen     Status: Abnormal   Collection Time: 11/24/2020 12:50 PM  Result Value Ref Range  Fibrinogen 763 (H) 210 - 475 mg/dL    Comment: Performed at [facility], [place] [address] [place], [place] [address]  C-reactive protein     Status: Abnormal   Collection Time: 11/23/2020 12:50 PM  Result Value Ref Range   CRP 20.3 (H) <1.0 mg/dL    Comment: Performed at [facility], [place] [address], [place], [place] [address]  Magnesium     Status: Abnormal   Collection Time: 11/30/2020 12:50 PM  Result Value Ref Range   Magnesium 1.5 (L) 1.7 - 2.4 mg/dL     Comment: Performed at [facility] [address], [place], [place] [address]  I-stat chem 8, ed     Status: Abnormal   Collection Time: 11/09/2020  1:01 PM  Result Value Ref Range   Sodium 142 135 - 145 mmol/L    Comment: THIS TEST WAS ORDERED IN ERROR AND HAS BEEN CREDITED. Performed at [facility], [place] [address] [place], [place] [address]    Potassium 3.8 3.5 - 5.1 mmol/L   Chloride 109 98 - 111 mmol/L   BUN 9 6 - 20 mg/dL   Creatinine, Ser 0.50 0.44 - 1.00 mg/dL   Glucose, Bld 296 (H) 70 - 99 mg/dL    Comment: Glucose reference range applies only to samples taken after fasting for at least 8 hours.   Calcium, Ion 1.11 (L) 1.15 - 1.40 mmol/L   TCO2 19 (L) 22 - 32 mmol/L   Hemoglobin 11.6 (L) 12.0 - 15.0 g/dL   HCT 34.0 (L) 36.0 - 46.0 %  I-Stat beta hCG blood, ED     Status: None   Collection Time: 11/21/2020  1:03 PM  Result Value Ref Range   I-stat hCG, quantitative <5.0 <5 mIU/mL   Comment 3            Comment:   GEST. AGE      CONC.  (mIU/mL)   <=1 WEEK        5 - 50     2 WEEKS       50 - 500     3 WEEKS       100 - 10,000     4 WEEKS     1,000 - 30,000        FEMALE AND NON-PREGNANT FEMALE:     LESS THAN 5 mIU/mL   Urinalysis, Routine w reflex microscopic Urine, Random     Status: Abnormal   Collection Time: 12/04/2020  3:28 PM  Result Value Ref Range   Color, Urine YELLOW YELLOW   APPearance CLOUDY (A) CLEAR   Specific Gravity, Urine 1.018 1.005 - 1.030   pH 6.0 5.0 - 8.0   Glucose, UA >=500 (A) NEGATIVE mg/dL   Hgb urine dipstick NEGATIVE NEGATIVE   Bilirubin Urine NEGATIVE NEGATIVE   Ketones, ur 80 (A) NEGATIVE mg/dL   Protein, ur >=300 (A) NEGATIVE mg/dL   Nitrite POSITIVE (A) NEGATIVE   Leukocytes,Ua LARGE (A) NEGATIVE   RBC / HPF 6-10 0 - 5 RBC/hpf   WBC, UA >50 (H) 0 - 5 WBC/hpf   Bacteria, UA MANY (A) NONE SEEN   Squamous Epithelial / LPF 0-5 0 - 5   Non Squamous Epithelial 6-10 (A) NONE SEEN    Comment: Performed at [facility],  [place] [address], [place], [place] [address]  CBG monitoring, ED     Status: Abnormal   Collection Time: 11/27/2020 11:41 PM  Result Value Ref Range   Glucose-Capillary 326 (H) 70 - 99 mg/dL    Comment: Glucose reference range applies only to samples taken after fasting for at least 8 hours.  C-reactive protein     Status: Abnormal   Collection Time: 11/30/20  3:42 AM  Result Value Ref Range   CRP 21.9 (H) <1.0 mg/dL    Comment: Performed at [facility], [address], [place], [place] [address]  Ferritin     Status: Abnormal   Collection Time: 11/30/20  3:42 AM  Result Value Ref Range   Ferritin 988 (H) 11 - 307 ng/mL    Comment: Performed at [facility], [address] [place], [place] [address]  Fibrinogen     Status: Abnormal   Collection Time: 11/30/20  3:42 AM  Result Value Ref Range   Fibrinogen 795 (H) 210 - 475 mg/dL    Comment: Performed at [facility], [address] [place] [address], [place], [place] [address]  Lactate dehydrogenase     Status: Abnormal   Collection Time: 11/30/20  3:42 AM  Result Value Ref Range   LDH 473 (H) 98 - 192 U/L    Comment: Performed at [facility], [address], [place], [place] [address]  Magnesium     Status: Abnormal   Collection Time: 11/30/20  3:42 AM  Result Value Ref Range   Magnesium 1.5 (L) 1.7 - 2.4 mg/dL    Comment: Performed at [facility], [address], [place], [place] [address]  Phosphorus     Status: None   Collection Time: 11/30/20  3:42 AM  Result Value Ref Range   Phosphorus 2.7 2.5 - 4.6 mg/dL    Comment: Performed at [facility], [address] [place], [place] [address]  Comprehensive metabolic panel     Status: Abnormal   Collection Time: 11/30/20  3:42 AM  Result Value Ref Range   Sodium 141 135 - 145 mmol/L   Potassium 4.0 3.5 - 5.1 mmol/L   Chloride 108 98 - 111 mmol/L   CO2 17 (L) 22 - 32 mmol/L   Glucose, Bld 352 (H) 70 - 99 mg/dL    Comment: Glucose reference range applies only to  samples taken after fasting for at least 8 hours.   BUN 14 6 - 20 mg/dL   Creatinine, Ser 3.15 0.44 - 1.00 mg/dL   Calcium 8.6 (L) 8.9 - 10.3 mg/dL   Total Protein 6.9 6.5 - 8.1 g/dL   Albumin 2.2 (L) 3.5 - 5.0 g/dL   AST 27 15 - 41 U/L   ALT 23 0 - 44 U/L   Alkaline Phosphatase 99 38 - 126 U/L   Total Bilirubin 1.1 0.3 - 1.2 mg/dL   GFR, Estimated >40 >08 mL/min    Comment: (NOTE) Calculated using the CKD-EPI Creatinine Equation (2021)    Anion gap 16 (H) 5 - 15    Comment: Performed at [facility], [address], [place], [place] [address]  CBC with Differential/Platelet     Status: Abnormal   Collection Time: 11/30/20  3:42 AM  Result Value Ref Range   WBC 6.3 4.0 - 10.5 K/uL   RBC 4.51 3.87 - 5.11 MIL/uL   Hemoglobin 12.8 12.0 - 15.0 g/dL   HCT 50.9 32.6 - 71.2 %   MCV 82.5 80.0 - 100.0 fL   MCH 28.4 26.0 - 34.0 pg   MCHC 34.4 30.0 -  36.0 g/dL   RDW 13.7 11.5 - 15.5 %   Platelets 442 (H) 150 - 400 K/uL   nRBC 0.0 0.0 - 0.2 %   Neutrophils Relative % 80 %   Neutro Abs 5.0 1.7 - 7.7 K/uL   Lymphocytes Relative 16 %   Lymphs Abs 1.0 0.7 - 4.0 K/uL   Monocytes Relative 4 %   Monocytes Absolute 0.3 0.1 - 1.0 K/uL   Eosinophils Relative 0 %   Eosinophils Absolute 0.0 0.0 - 0.5 K/uL   Basophils Relative 0 %   Basophils Absolute 0.0 0.0 - 0.1 K/uL   nRBC 0 0 /100 WBC   Abs Immature Granulocytes 0.00 0.00 - 0.07 K/uL    Comment: Performed at [facility] [address], [place], [place] [address]  TSH     Status: Abnormal   Collection Time: 11/30/20  3:42 AM  Result Value Ref Range   TSH 0.278 (L) 0.350 - 4.500 uIU/mL    Comment: Performed by a 3rd Generation assay with a functional sensitivity of <=0.01 uIU/mL. Performed at [facility], [place] [address] [place], [place] [address]   I-Stat arterial blood gas, ED     Status: Abnormal   Collection Time: 11/30/20  4:15 AM  Result Value Ref Range   pH, Arterial 7.442 7.350 - 7.450   pCO2 arterial 26.3  (L) 32.0 - 48.0 mmHg   pO2, Arterial 83 83.0 - 108.0 mmHg   Bicarbonate 18.0 (L) 20.0 - 28.0 mmol/L   TCO2 19 (L) 22 - 32 mmol/L   O2 Saturation 97.0 %   Acid-base deficit 5.0 (H) 0.0 - 2.0 mmol/L   Sodium 141 135 - 145 mmol/L   Potassium 3.6 3.5 - 5.1 mmol/L   Calcium, Ion 1.21 1.15 - 1.40 mmol/L   HCT 33.0 (L) 36.0 - 46.0 %   Hemoglobin 11.2 (L) 12.0 - 15.0 g/dL   Patient temperature 98.3 F    Sample type ARTERIAL   CBG monitoring, ED     Status: Abnormal   Collection Time: 11/30/20  7:18 AM  Result Value Ref Range   Glucose-Capillary 281 (H) 70 - 99 mg/dL    Comment: Glucose reference range applies only to samples taken after fasting for at least 8 hours.  Basic metabolic panel     Status: Abnormal   Collection Time: 11/30/20  7:51 AM  Result Value Ref Range   Sodium 140 135 - 145 mmol/L   Potassium 3.7 3.5 - 5.1 mmol/L   Chloride 107 98 - 111 mmol/L   CO2 20 (L) 22 - 32 mmol/L   Glucose, Bld 307 (H) 70 - 99 mg/dL    Comment: Glucose reference range applies only to samples taken after fasting for at least 8 hours.   BUN 13 6 - 20 mg/dL   Creatinine, Ser 0.62 0.44 - 1.00 mg/dL   Calcium 8.5 (L) 8.9 - 10.3 mg/dL   GFR, Estimated >60 >60 mL/min    Comment: (NOTE) Calculated using the CKD-EPI Creatinine Equation (2021)    Anion gap 13 5 - 15    Comment: Performed at [facility] [address], [place], [place] [address]  Lipase, blood     Status: None   Collection Time: 11/30/20  7:51 AM  Result Value Ref Range   Lipase 19 11 - 51 U/L    Comment: Performed at [facility] [address] [place], [place] [address]  CBG monitoring, ED  Status: Abnormal   Collection Time: 11/30/20  1:07 PM  Result Value Ref Range   Glucose-Capillary 313 (H) 70 - 99 mg/dL    Comment: Glucose reference range applies only to samples taken after fasting for at least 8 hours.  MRSA PCR Screening     Status: None   Collection Time: 11/30/20  2:45 PM   Specimen: Nasal Mucosa;  Nasopharyngeal  Result Value Ref Range   MRSA by PCR NEGATIVE NEGATIVE    Comment:        The GeneXpert MRSA Assay (FDA approved for NASAL specimens only), is one component of a comprehensive MRSA colonization surveillance program. It is not intended to diagnose MRSA infection nor to guide or monitor treatment for MRSA infections. Performed at [facility], [place] [address], [place], [place] [address]   Lactic acid, plasma     Status: None   Collection Time: 11/30/20  3:35 PM  Result Value Ref Range   Lactic Acid, Venous 1.3 0.5 - 1.9 mmol/L    Comment: Performed at [facility] [address], [place], [place] [address]  Glucose, capillary     Status: Abnormal   Collection Time: 11/30/20  4:27 PM  Result Value Ref Range   Glucose-Capillary 304 (H) 70 - 99 mg/dL    Comment: Glucose reference range applies only to samples taken after fasting for at least 8 hours.   Comment 1 Notify RN    Comment 2 Document in Chart   Glucose, capillary     Status: Abnormal   Collection Time: 11/30/20  8:19 PM  Result Value Ref Range   Glucose-Capillary 307 (H) 70 - 99 mg/dL    Comment: Glucose reference range applies only to samples taken after fasting for at least 8 hours.  Glucose, capillary     Status: Abnormal   Collection Time: 11/30/20 10:31 PM  Result Value Ref Range   Glucose-Capillary 290 (H) 70 - 99 mg/dL    Comment: Glucose reference range applies only to samples taken after fasting for at least 8 hours.   *Note: Due to a large number of results and/or encounters for the requested time period, some results have not been displayed. A complete set of results can be found in Results Review.   CT HEAD WO CONTRAST  Result Date: 11/30/2020 CLINICAL DATA:  Mental status changes EXAM: CT HEAD WITHOUT CONTRAST TECHNIQUE: Contiguous axial images were obtained from the base of the skull through the vertex without intravenous contrast. COMPARISON:  12/31/2019 FINDINGS: Brain: Minor  white matter microvascular ischemic changes about lateral ventricles throughout both cerebral hemispheres. No acute intracranial hemorrhage infarction, midline shift hydrocephalus, or extra-axial collection. No focal mass effect. Cisterns are patent. No cerebellar abnormality. Vascular: No hyperdense vessel or unexpected calcification. Skull: Normal. Negative for fracture or focal lesion. Sinuses/Orbits: Orbits are unremarkable. Chronic maxillary sinus disease bilaterally. Other: None. IMPRESSION: Minor chronic white matter microvascular changes. No acute intracranial abnormality by noncontrast CT. Electronically Signed   By: Jerilynn Mages.  Shick M.D.   On: 11/30/2020 13:48   DG Chest Port 1 View  Result Date: 11/16/2020 CLINICAL DATA:  Shortness of breath EXAM: PORTABLE CHEST 1 VIEW COMPARISON:  07/31/2019 FINDINGS: Cardiac shadow is within normal limits. Patchy airspace opacity is noted bilaterally consistent with multifocal pneumonia. Correlate with COVID testing. No effusion is seen. Postsurgical changes in the cervical spine are noted. IMPRESSION: Increased bilateral airspace opacities consistent with multifocal pneumonia. Correlate with COVID-19 testing. Electronically Signed   By: Elta Guadeloupe  Lukens M.D.   On: 11/09/2020 12:47   DG Abd Portable 1V  Result Date: 11/30/2020 CLINICAL DATA:  Abdominal pain, history of COVID-19 positivity EXAM: PORTABLE ABDOMEN - 1 VIEW COMPARISON:  10/08/2020 FINDINGS: Postsurgical changes are noted in the left femur and lumbar spine. Mild degenerative changes of lumbar spine are seen. Scattered large and small bowel gas is noted. No free air is seen. Changes of prior cholecystectomy are noted. IMPRESSION: No acute abnormality noted. Electronically Signed   By: Inez Catalina M.D.   On: 11/30/2020 09:57    Assessment: 60 y.o. female admitted for Covid pneumonia who experienced acute onset of aphasia and left sided weakness with rightward gaze deviation at 2235 1. Exam findings are  consistent with an acute right MCA ischemic infarction 2. CT head without acute abnormality. 3. 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 There is absent flow within the right M1 segment and distal right MCA branches, with relatively little if any collateralization within the right MCA distribution. There is bulky calcified plaque about the right bifurcation/proximal right ICA with associated stenosis of up to 30% by NASCET criteria. Superimposed focal filling defect at the proximal right ICA consistent with thrombus, likely due to an ulcerated and/or ruptured plaque. This is almost certainly the embolic source. 4. After comprehensive review of possible contraindications, she has no absolute contraindications to tPA administration. 5. The patient is a tPA candidate. Discussed extensively the risks/benefits of tPA treatment vs. no treatment with her husband over the telephone, including risks of hemorrhage and death with tPA administration versus worse overall outcomes on average in patients within tPA time window who are not administered tPA. The patient's aphasia precludes meaningful medical decision making on her part at this time. Overall benefits of tPA regarding long-term prognosis are felt to outweigh risks. The patient's husband expressed understanding and wish to proceed with tPA. Consent witnessed by Lamont Dowdy, PharmD 6, Stroke Risk Factors - Multiple stroke risk factors per HPI, including probable hypercoagulability secondary to Covid infection.  7. Due to mRS of 4 at baseline, the patient is not a candidate for thrombectomy.   Recommendations: 1. Post-tPA order set to include frequent neuro checks and BP management.  3. No antiplatelet medications or anticoagulants for at least 24 hours following tPA.  4. DVT prophylaxis with SCDs.  5. Will need to start antiplatelet therapy if follow up CT at 24 hours is negative for hemorrhagic conversion. However, an alternative  management plan would be initiation of IV heparin at 24 hours as stump embolization is also a concern.  6. TTE.  7. MRI brain 8. Cardiac telemetry  9. PT/OT/Speech.  10. NPO    60 minutes spent in the emergent neurological evaluation and management of this critically ill patient  @Electronically  signed: Dr. Kerney Elbe 11/30/2020, 11:43 PM

## 2020-11-30 NOTE — Progress Notes (Signed)
Safety Mitts applied, patient continues to pull off non-rebreather.

## 2020-12-01 ENCOUNTER — Inpatient Hospital Stay (HOSPITAL_COMMUNITY): Payer: Medicare Other

## 2020-12-01 DIAGNOSIS — E1165 Type 2 diabetes mellitus with hyperglycemia: Secondary | ICD-10-CM

## 2020-12-01 DIAGNOSIS — I63131 Cerebral infarction due to embolism of right carotid artery: Secondary | ICD-10-CM

## 2020-12-01 DIAGNOSIS — J80 Acute respiratory distress syndrome: Secondary | ICD-10-CM | POA: Diagnosis not present

## 2020-12-01 DIAGNOSIS — G9341 Metabolic encephalopathy: Secondary | ICD-10-CM

## 2020-12-01 DIAGNOSIS — G9349 Other encephalopathy: Secondary | ICD-10-CM

## 2020-12-01 DIAGNOSIS — E1169 Type 2 diabetes mellitus with other specified complication: Secondary | ICD-10-CM | POA: Diagnosis not present

## 2020-12-01 DIAGNOSIS — L8990 Pressure ulcer of unspecified site, unspecified stage: Secondary | ICD-10-CM | POA: Insufficient documentation

## 2020-12-01 DIAGNOSIS — J1282 Pneumonia due to coronavirus disease 2019: Secondary | ICD-10-CM

## 2020-12-01 DIAGNOSIS — I639 Cerebral infarction, unspecified: Secondary | ICD-10-CM | POA: Diagnosis not present

## 2020-12-01 DIAGNOSIS — I6389 Other cerebral infarction: Secondary | ICD-10-CM | POA: Diagnosis not present

## 2020-12-01 DIAGNOSIS — U071 COVID-19: Secondary | ICD-10-CM | POA: Diagnosis not present

## 2020-12-01 DIAGNOSIS — J9601 Acute respiratory failure with hypoxia: Secondary | ICD-10-CM

## 2020-12-01 DIAGNOSIS — R739 Hyperglycemia, unspecified: Secondary | ICD-10-CM

## 2020-12-01 LAB — POCT I-STAT 7, (LYTES, BLD GAS, ICA,H+H)
Acid-base deficit: 4 mmol/L — ABNORMAL HIGH (ref 0.0–2.0)
Acid-base deficit: 7 mmol/L — ABNORMAL HIGH (ref 0.0–2.0)
Bicarbonate: 21 mmol/L (ref 20.0–28.0)
Bicarbonate: 23.4 mmol/L (ref 20.0–28.0)
Calcium, Ion: 1.28 mmol/L (ref 1.15–1.40)
Calcium, Ion: 1.2 mmol/L (ref 1.15–1.40)
HCT: 36 % (ref 36.0–46.0)
HCT: 36 % (ref 36.0–46.0)
Hemoglobin: 12.2 g/dL (ref 12.0–15.0)
Hemoglobin: 12.2 g/dL (ref 12.0–15.0)
O2 Saturation: 92 %
O2 Saturation: 91 %
Patient temperature: 98.6
Potassium: 3.6 mmol/L (ref 3.5–5.1)
Potassium: 3.6 mmol/L (ref 3.5–5.1)
Sodium: 145 mmol/L (ref 135–145)
Sodium: 145 mmol/L (ref 135–145)
TCO2: 22 mmol/L (ref 22–32)
TCO2: 26 mmol/L (ref 22–32)
pCO2 arterial: 36.4 mmHg (ref 32.0–48.0)
pCO2 arterial: 71.1 mmHg (ref 32.0–48.0)
pH, Arterial: 7.369 (ref 7.350–7.450)
pH, Arterial: 7.125 — CL (ref 7.350–7.450)
pO2, Arterial: 66 mmHg — ABNORMAL LOW (ref 83.0–108.0)
pO2, Arterial: 82 mmHg — ABNORMAL LOW (ref 83.0–108.0)

## 2020-12-01 LAB — CBC
HCT: 39.4 % (ref 36.0–46.0)
Hemoglobin: 12.8 g/dL (ref 12.0–15.0)
MCH: 26.4 pg (ref 26.0–34.0)
MCHC: 32.5 g/dL (ref 30.0–36.0)
MCV: 81.4 fL (ref 80.0–100.0)
Platelets: 474 10*3/uL — ABNORMAL HIGH (ref 150–400)
RBC: 4.84 MIL/uL (ref 3.87–5.11)
RDW: 13.5 % (ref 11.5–15.5)
WBC: 7.2 10*3/uL (ref 4.0–10.5)
nRBC: 0 % (ref 0.0–0.2)

## 2020-12-01 LAB — MAGNESIUM: Magnesium: 1.8 mg/dL (ref 1.7–2.4)

## 2020-12-01 LAB — GLUCOSE, CAPILLARY
Glucose-Capillary: 115 mg/dL — ABNORMAL HIGH (ref 70–99)
Glucose-Capillary: 124 mg/dL — ABNORMAL HIGH (ref 70–99)
Glucose-Capillary: 130 mg/dL — ABNORMAL HIGH (ref 70–99)
Glucose-Capillary: 137 mg/dL — ABNORMAL HIGH (ref 70–99)
Glucose-Capillary: 147 mg/dL — ABNORMAL HIGH (ref 70–99)
Glucose-Capillary: 183 mg/dL — ABNORMAL HIGH (ref 70–99)
Glucose-Capillary: 198 mg/dL — ABNORMAL HIGH (ref 70–99)
Glucose-Capillary: 262 mg/dL — ABNORMAL HIGH (ref 70–99)
Glucose-Capillary: 281 mg/dL — ABNORMAL HIGH (ref 70–99)
Glucose-Capillary: 284 mg/dL — ABNORMAL HIGH (ref 70–99)
Glucose-Capillary: 285 mg/dL — ABNORMAL HIGH (ref 70–99)
Glucose-Capillary: 301 mg/dL — ABNORMAL HIGH (ref 70–99)
Glucose-Capillary: 344 mg/dL — ABNORMAL HIGH (ref 70–99)
Glucose-Capillary: 350 mg/dL — ABNORMAL HIGH (ref 70–99)
Glucose-Capillary: 377 mg/dL — ABNORMAL HIGH (ref 70–99)

## 2020-12-01 LAB — COMPREHENSIVE METABOLIC PANEL
ALT: 21 U/L (ref 0–44)
AST: 23 U/L (ref 15–41)
Albumin: 2.2 g/dL — ABNORMAL LOW (ref 3.5–5.0)
Alkaline Phosphatase: 102 U/L (ref 38–126)
Anion gap: 15 (ref 5–15)
BUN: 17 mg/dL (ref 6–20)
CO2: 22 mmol/L (ref 22–32)
Calcium: 8.9 mg/dL (ref 8.9–10.3)
Chloride: 103 mmol/L (ref 98–111)
Creatinine, Ser: 0.6 mg/dL (ref 0.44–1.00)
GFR, Estimated: >60 mL/min (ref >60)
Glucose, Bld: 382 mg/dL — ABNORMAL HIGH (ref 70–99)
Potassium: 4.2 mmol/L (ref 3.5–5.1)
Sodium: 140 mmol/L (ref 135–145)
Total Bilirubin: 1 mg/dL (ref 0.3–1.2)
Total Protein: 7.4 g/dL (ref 6.5–8.1)

## 2020-12-01 LAB — ECHOCARDIOGRAM COMPLETE
Calc EF: 61.7 %
S' Lateral: 3.5 cm
Single Plane A2C EF: 53.9 %
Single Plane A4C EF: 73.8 %
Weight: 3400.38 oz

## 2020-12-01 LAB — BLOOD GAS, ARTERIAL
Acid-base deficit: 1.5 mmol/L (ref 0.0–2.0)
Bicarbonate: 22.3 mmol/L (ref 20.0–28.0)
FIO2: 21
O2 Saturation: 94.1 %
Patient temperature: 37
pCO2 arterial: 34.8 mmHg (ref 32.0–48.0)
pH, Arterial: 7.423 (ref 7.350–7.450)
pO2, Arterial: 72.2 mmHg — ABNORMAL LOW (ref 83.0–108.0)

## 2020-12-01 LAB — D-DIMER, QUANTITATIVE: D-Dimer, Quant: >20 ug/mL-FEU — ABNORMAL HIGH (ref 0.00–0.50)

## 2020-12-01 LAB — BRAIN NATRIURETIC PEPTIDE: B Natriuretic Peptide: 97 pg/mL (ref 0.0–100.0)

## 2020-12-01 LAB — C-REACTIVE PROTEIN: CRP: 15.9 mg/dL — ABNORMAL HIGH (ref <1.0)

## 2020-12-01 LAB — SODIUM: Sodium: 146 mmol/L — ABNORMAL HIGH (ref 135–145)

## 2020-12-01 MED ORDER — ETOMIDATE 2 MG/ML IV SOLN
INTRAVENOUS | Status: AC
  Filled 2020-12-01: qty 20

## 2020-12-01 MED ORDER — SODIUM CHLORIDE 0.9 % IV SOLN
50.0000 mL | Freq: Once | INTRAVENOUS | Status: AC
  Administered 2020-12-01: 50 mL via INTRAVENOUS

## 2020-12-01 MED ORDER — FENTANYL CITRATE (PF) 100 MCG/2ML IJ SOLN
50.0000 ug | Freq: Once | INTRAMUSCULAR | Status: DC
Start: 2020-12-01 — End: 2020-12-02

## 2020-12-01 MED ORDER — INSULIN REGULAR(HUMAN) IN NACL 100-0.9 UT/100ML-% IV SOLN
INTRAVENOUS | Status: DC
  Administered 2020-12-01: 4.8 [IU]/h via INTRAVENOUS
  Administered 2020-12-01: 24 [IU]/h via INTRAVENOUS
  Filled 2020-12-01 (×2): qty 100

## 2020-12-01 MED ORDER — INSULIN ASPART 100 UNIT/ML ~~LOC~~ SOLN
0.0000 [IU] | SUBCUTANEOUS | Status: DC
  Administered 2020-12-01: 15 [IU] via SUBCUTANEOUS
  Administered 2020-12-01: 11 [IU] via SUBCUTANEOUS

## 2020-12-01 MED ORDER — PERFLUTREN LIPID MICROSPHERE
1.0000 mL | INTRAVENOUS | Status: AC | PRN
Start: 2020-12-01 — End: 2020-12-01
  Administered 2020-12-01: 2 mL via INTRAVENOUS
  Filled 2020-12-01: qty 10

## 2020-12-01 MED ORDER — VECURONIUM BROMIDE 10 MG IV SOLR
INTRAVENOUS | Status: AC
  Filled 2020-12-01: qty 10

## 2020-12-01 MED ORDER — INSULIN DETEMIR 100 UNIT/ML ~~LOC~~ SOLN
15.0000 [IU] | Freq: Two times a day (BID) | SUBCUTANEOUS | Status: DC
  Administered 2020-12-01: 15 [IU] via SUBCUTANEOUS
  Filled 2020-12-01 (×3): qty 0.15

## 2020-12-01 MED ORDER — SODIUM CHLORIDE 0.9 % IV SOLN
1.0000 g | INTRAVENOUS | Status: DC
  Administered 2020-12-01: 1 g via INTRAVENOUS
  Filled 2020-12-01: qty 10

## 2020-12-01 MED ORDER — FENTANYL CITRATE (PF) 100 MCG/2ML IJ SOLN
INTRAMUSCULAR | Status: AC
  Filled 2020-12-01: qty 2

## 2020-12-01 MED ORDER — ORAL CARE MOUTH RINSE
15.0000 mL | Freq: Two times a day (BID) | OROMUCOSAL | Status: DC
  Administered 2020-12-01 (×3): 15 mL via OROMUCOSAL

## 2020-12-01 MED ORDER — VECURONIUM BROMIDE 10 MG IV SOLR
10.0000 mg | Freq: Once | INTRAVENOUS | Status: AC
  Administered 2020-12-01: 10 mg via INTRAVENOUS
  Filled 2020-12-01: qty 10

## 2020-12-01 MED ORDER — INSULIN GLARGINE 100 UNIT/ML ~~LOC~~ SOLN
15.0000 [IU] | Freq: Two times a day (BID) | SUBCUTANEOUS | Status: DC
  Filled 2020-12-01: qty 0.15

## 2020-12-01 MED ORDER — PANTOPRAZOLE SODIUM 40 MG PO PACK
40.0000 mg | PACK | Freq: Every day | ORAL | Status: DC
  Administered 2020-12-01: 40 mg
  Filled 2020-12-01: qty 20

## 2020-12-01 MED ORDER — DEXTROSE 50 % IV SOLN: 0.0000 mL | INTRAVENOUS | Status: DC | PRN

## 2020-12-01 MED ORDER — VECURONIUM BROMIDE 10 MG IV SOLR: 0.1000 mg/kg | INTRAVENOUS | Status: DC | PRN

## 2020-12-01 MED ORDER — ARTIFICIAL TEARS OPHTHALMIC OINT
1.0000 "application " | TOPICAL_OINTMENT | Freq: Three times a day (TID) | OPHTHALMIC | Status: DC
  Administered 2020-12-01: 1 via OPHTHALMIC
  Filled 2020-12-01: qty 3.5

## 2020-12-01 MED ORDER — SODIUM CHLORIDE 3 % IV SOLN
INTRAVENOUS | Status: DC
  Filled 2020-12-01 (×3): qty 500

## 2020-12-01 MED ORDER — PHENYLEPHRINE HCL-NACL 10-0.9 MG/250ML-% IV SOLN: 0.0000 ug/min | INTRAVENOUS | Status: DC

## 2020-12-01 MED ORDER — FENTANYL BOLUS VIA INFUSION
50.0000 ug | INTRAVENOUS | Status: DC | PRN
  Administered 2020-12-01: 50 ug via INTRAVENOUS
  Filled 2020-12-01: qty 50

## 2020-12-01 MED ORDER — MIDAZOLAM HCL 2 MG/2ML IJ SOLN
2.0000 mg | INTRAMUSCULAR | Status: DC | PRN
  Administered 2020-12-01: 2 mg via INTRAVENOUS
  Filled 2020-12-01 (×2): qty 2

## 2020-12-01 MED ORDER — ATORVASTATIN CALCIUM 40 MG PO TABS: 40.0000 mg | ORAL_TABLET | Freq: Every day | ORAL | Status: DC

## 2020-12-01 MED ORDER — POLYETHYLENE GLYCOL 3350 17 G PO PACK: 17.0000 g | PACK | Freq: Every day | ORAL | Status: DC

## 2020-12-01 MED ORDER — IOHEXOL 350 MG/ML SOLN
75.0000 mL | Freq: Once | INTRAVENOUS | Status: AC | PRN
  Administered 2020-12-01: 75 mL via INTRAVENOUS

## 2020-12-01 MED ORDER — PROPOFOL 1000 MG/100ML IV EMUL
0.0000 ug/kg/min | INTRAVENOUS | Status: DC
  Administered 2020-12-01: 5 ug/kg/min via INTRAVENOUS
  Filled 2020-12-01: qty 100

## 2020-12-01 MED ORDER — LACTATED RINGERS IV BOLUS
500.0000 mL | Freq: Once | INTRAVENOUS | Status: AC
  Administered 2020-12-01: 500 mL via INTRAVENOUS

## 2020-12-01 MED ORDER — CHLORHEXIDINE GLUCONATE CLOTH 2 % EX PADS: 6.0000 | MEDICATED_PAD | Freq: Every day | CUTANEOUS | Status: DC

## 2020-12-01 MED ORDER — INSULIN ASPART 100 UNIT/ML ~~LOC~~ SOLN: 3.0000 [IU] | SUBCUTANEOUS | Status: DC

## 2020-12-01 MED ORDER — ALBUMIN HUMAN 5 % IV SOLN
12.5000 g | Freq: Once | INTRAVENOUS | Status: AC
  Administered 2020-12-01: 12.5 g via INTRAVENOUS
  Filled 2020-12-01: qty 250

## 2020-12-01 MED ORDER — ETOMIDATE 2 MG/ML IV SOLN
INTRAVENOUS | Status: AC
  Administered 2020-12-01: 20 mg
  Filled 2020-12-01: qty 10

## 2020-12-01 MED ORDER — IPRATROPIUM-ALBUTEROL 0.5-2.5 (3) MG/3ML IN SOLN: 3.0000 mL | Freq: Four times a day (QID) | RESPIRATORY_TRACT | Status: DC

## 2020-12-01 MED ORDER — DOCUSATE SODIUM 50 MG/5ML PO LIQD
100.0000 mg | Freq: Two times a day (BID) | ORAL | Status: DC
  Administered 2020-12-01: 100 mg
  Filled 2020-12-01: qty 10

## 2020-12-01 MED ORDER — PHENYLEPHRINE HCL-NACL 10-0.9 MG/250ML-% IV SOLN
INTRAVENOUS | Status: AC
  Administered 2020-12-01: 20 ug/min via INTRAVENOUS
  Filled 2020-12-01: qty 250

## 2020-12-01 MED ORDER — FENTANYL CITRATE (PF) 100 MCG/2ML IJ SOLN
INTRAMUSCULAR | Status: AC
  Administered 2020-12-01: 100 ug
  Filled 2020-12-01: qty 2

## 2020-12-01 MED ORDER — ROCURONIUM BROMIDE 10 MG/ML (PF) SYRINGE
PREFILLED_SYRINGE | INTRAVENOUS | Status: AC
  Administered 2020-12-01: 50 mg
  Filled 2020-12-01: qty 10

## 2020-12-01 MED ORDER — LORAZEPAM 2 MG/ML IJ SOLN
INTRAMUSCULAR | Status: AC
  Administered 2020-12-01: 2 mg via INTRAVENOUS
  Filled 2020-12-01: qty 1

## 2020-12-01 MED ORDER — LORAZEPAM 2 MG/ML IJ SOLN: 2.0000 mg | Freq: Once | INTRAMUSCULAR | Status: AC

## 2020-12-01 MED ORDER — PHENYLEPHRINE HCL-NACL 10-0.9 MG/250ML-% IV SOLN
25.0000 ug/min | INTRAVENOUS | Status: DC
  Administered 2020-12-01: 60 ug/min via INTRAVENOUS
  Administered 2020-12-02: 130 ug/min via INTRAVENOUS
  Administered 2020-12-02: 60 ug/min via INTRAVENOUS
  Filled 2020-12-01 (×4): qty 250

## 2020-12-01 MED ORDER — FENTANYL BOLUS VIA INFUSION
50.0000 ug | INTRAVENOUS | Status: DC | PRN
  Filled 2020-12-01: qty 50

## 2020-12-01 MED ORDER — MIDAZOLAM HCL 2 MG/2ML IJ SOLN
INTRAMUSCULAR | Status: AC
  Administered 2020-12-01: 2 mg
  Filled 2020-12-01: qty 4

## 2020-12-01 MED ORDER — IPRATROPIUM-ALBUTEROL 0.5-2.5 (3) MG/3ML IN SOLN
RESPIRATORY_TRACT | Status: AC
  Administered 2020-12-01: 3 mL via RESPIRATORY_TRACT
  Filled 2020-12-01: qty 3

## 2020-12-01 MED ORDER — SODIUM CHLORIDE 0.9 % IV SOLN
250.0000 mL | INTRAVENOUS | Status: DC
  Administered 2020-12-01: 250 mL via INTRAVENOUS

## 2020-12-01 MED ORDER — FENTANYL 2500MCG IN NS 250ML (10MCG/ML) PREMIX INFUSION
0.0000 ug/h | INTRAVENOUS | Status: DC
  Administered 2020-12-01: 50 ug/h via INTRAVENOUS
  Filled 2020-12-01 (×2): qty 250

## 2020-12-01 MED ORDER — PROPOFOL 1000 MG/100ML IV EMUL
25.0000 ug/kg/min | INTRAVENOUS | Status: DC
  Administered 2020-12-01: 35 ug/kg/min via INTRAVENOUS
  Filled 2020-12-01 (×2): qty 100

## 2020-12-01 MED ORDER — FENTANYL CITRATE (PF) 100 MCG/2ML IJ SOLN
50.0000 ug | Freq: Once | INTRAMUSCULAR | Status: AC
  Administered 2020-12-01: 50 ug via INTRAVENOUS

## 2020-12-01 MED ORDER — SODIUM CHLORIDE 0.9 % IV SOLN: INTRAVENOUS | Status: DC | PRN

## 2020-12-01 MED ORDER — METHYLPREDNISOLONE SODIUM SUCC 125 MG IJ SOLR
60.0000 mg | Freq: Two times a day (BID) | INTRAMUSCULAR | Status: DC
  Administered 2020-12-01: 60 mg via INTRAVENOUS
  Filled 2020-12-01: qty 2

## 2020-12-01 MED ORDER — ALTEPLASE (STROKE) FULL DOSE INFUSION
90.0000 mg | Freq: Once | INTRAVENOUS | Status: AC
  Administered 2020-12-01: 90 mg via INTRAVENOUS
  Filled 2020-12-01: qty 100

## 2020-12-01 MED ORDER — ATORVASTATIN CALCIUM 40 MG PO TABS
40.0000 mg | ORAL_TABLET | Freq: Every day | ORAL | Status: DC
  Administered 2020-12-01: 40 mg via ORAL
  Filled 2020-12-01: qty 1

## 2020-12-01 MED ORDER — LACTATED RINGERS IV SOLN: INTRAVENOUS | Status: DC

## 2020-12-01 MED ORDER — FENTANYL 2500MCG IN NS 250ML (10MCG/ML) PREMIX INFUSION
50.0000 ug/h | INTRAVENOUS | Status: DC
  Administered 2020-12-01: 200 ug/h via INTRAVENOUS

## 2020-12-01 MED ORDER — MIDAZOLAM HCL 2 MG/2ML IJ SOLN
2.0000 mg | INTRAMUSCULAR | Status: DC | PRN
  Administered 2020-12-01: 2 mg via INTRAVENOUS

## 2020-12-01 NOTE — Procedures (Signed)
Arterial Catheter Insertion Procedure Note  Sandra Ferrell  [id number]  [birth year]  Date:12/01/20  Time:4:14 PM    Provider Performing: Jacky Kindle    Procedure: Insertion of Arterial Line [phone]) with US guidance ([id number])   Indication(s) Blood pressure monitoring and/or need for frequent ABGs  Consent Unable to obtain consent due to emergent nature of procedure.  Anesthesia None   Time Out Verified patient identification, verified procedure, site/side was marked, verified correct patient position, special equipment/implants available, medications/allergies/relevant history reviewed, required imaging and test results available.   Sterile Technique Maximal sterile technique including full sterile barrier drape, hand hygiene, sterile gown, sterile gloves, mask, hair covering, sterile ultrasound probe cover (if used).   Procedure Description Area of catheter insertion was cleaned with chlorhexidine and draped in sterile fashion. With real-time ultrasound guidance an arterial catheter was placed into the left Axillary artery.  Appropriate arterial tracings confirmed on monitor.     Complications/Tolerance None; patient tolerated the procedure well.   EBL Minimal   Specimen(s) None

## 2020-12-01 NOTE — Progress Notes (Signed)
[facility] Progress Note Patient Name: Sandra Ferrell DOB: [birth year] MRN: [id number]   Date of Service  12/01/2020  HPI/Events of Note  Patient with hyperglycemia (290 - 344) on current insulin management protocol.  eICU Interventions  Patient switched to Q 4 hourly CBG and resistant scale  SQ insulin coverage.        [name] Alonnie Bieker 12/01/2020, 3:04 AM

## 2020-12-01 NOTE — Progress Notes (Signed)
Patient was found to be very lethargic and unresponsive at around 10:30 pm . CBG checked and was 290. Patient had right sided gaze and left side neglect.  Rapid was called. MD was paged. PCCM was consulted. Upon PCCM advice Code Stroke was called. STAT ABG received, patient taken to CT, TPA given and patient was transferred to [facility] 08.

## 2020-12-01 NOTE — Progress Notes (Signed)
[facility] Progress Note Patient Name: Sandra Ferrell DOB: 1960-11-07 MRN: [id number]   Date of Service  12/01/2020  HPI/Events of Note  Patient with desaturation upon returning from [place].  eICU Interventions  PEEP increased to 16, stat portable CXR ordered to r/o pneumothorax, ABG will be obtained 45 minutes         Shanikqua Zarzycki U Bookert Guzzi 12/01/2020, 9:52 PM

## 2020-12-01 NOTE — Progress Notes (Signed)
PHARMACIST CODE STROKE RESPONSE  Notified to mix tPA at 0010 by Dr. Cheral Marker Delivered tPA to RN at 0012  tPA dose = 9 mg bolus over 1 minute followed by 81 mg for a total dose of 90 mg over 1 hour   Sandra Ferrell 12/01/20 12:26 AM

## 2020-12-01 NOTE — Significant Event (Signed)
Rapid Response Event Note   Reason for Call :  Originally called at 2235 d/t development of R sided gaze and L arm flaccidity, LSN-2000. RRT in emergency and asked RN to have MD come see pt.  Code Stroke initiated on pt at 2311 after speaking with Elink MD.   RRT arrived at bedside at 2315.  Initial Focused Assessment:  Pt lying in bed with R sided gaze. L arm is flaccid. Pt is aphasic and will not follow commands. NIH-28. CBG checked prior to calling Code Stroke and was 290. There was some concern about pt's somnolence and breathing pattern(it was somewhat labored) so CT was delayed until after being assessed by Dr. Anselm Pancoast) and Dr. Charlies Constable MD). Pt taken to CT at 2345. 2mg  ativan given at 0005 d/t inability to obtain CT d/t agitation. CTH-no acute hemorrhage but concern for R MCA territory infarct and CTA-+LVO, R ICA terminus occlusion.  TPA started at [facility]. Pt not a candidate for IR. Pt txed to 3M08.    Interventions:  See above  Plan of Care:  Pt txed to 3M08.   Event Summary:   MD Notified: Dr. Cyd Silence notified by bedside RN Call [place]  Dillard Essex, RN

## 2020-12-01 NOTE — Progress Notes (Signed)
Pt transported form 3M08 to CT and back with non complications.

## 2020-12-01 NOTE — Progress Notes (Signed)
[facility] Progress Note Patient Name: Sandra Ferrell DOB: 1961-05-22 MRN: [id number]   Date of Service  12/01/2020  HPI/Events of Note  Patient with extreme ventilator dyssynchrony, hypotension, tachycardia despite Propofol / Fentanyl infusion and versed 2 mg iv just given.  eICU Interventions  Vecuronium 10 mg iv x 1, Phenylephrine infusion via peripheral line protocol, check BNP to gauge volume status.        Frederik Pear 12/01/2020, 8:27 PM

## 2020-12-01 NOTE — Progress Notes (Signed)
[facility] Progress Note Patient Name: Sandra Ferrell DOB: 08-03-1961 MRN: [id number]   Date of Service  12/01/2020  HPI/Events of Note  PH 7.12, PCO2 71.1, PO2 82  eICU Interventions  Respiratory rate increased from 28 to 35 and repeat ABG ordered.        [name] Sandra Ferrell 12/01/2020, 11:30 PM

## 2020-12-01 NOTE — Progress Notes (Signed)
[facility] Progress Note  Patient: Sandra Ferrell    NWG:[id number]  DOA: Dec 06, 2020     Date of Service: the patient was seen and examined on 12/01/2020  Brief hospital course: Hospital history of HTN, HLD, type II DM, COPD, anxiety, chronic diastolic CHF presents with complaints of confusion and shortness of breath.  Found to have COVID-19 pneumonia with encephalopathy.  1/26-1/27: Developed Right-sided gaze preference.:  Stroke was called.  Neurology was consulted.  Received TPA.  Transferred to ICU. 1/27: Intubated in the ICU for hypotension. Currently plan is per PCCM and neurology  Assessment and Plan: 1. Acute Hypoxic respiratory failure, POA, 63% on room air on admission.  Acute COVID-19 Viral Pneumonia Severe sepsis, POA with fever, tachypnea and tachycardia CXR: hazy bilateral peripheral opacities Remdesivir: Initiated on 1/25 Steroids: Initiated on Decadron, currently on Solu-Medrol 1/25 Baricitinib/Actemra: Initiated on 1/25 after discussion with the family.  No contraindication provided by family.  After her code stroke, patient's respiratory condition progressively worsened requiring intubation for lateral protection and mechanical ventilation.   CARE Currently with PCCM.  2.  Acute metabolic encephalopathy At baseline patient is alert awake and oriented.  Able to functional without any confusion. Currently repeating some questions, not oriented to time and place. Likely delirium in the setting of acute infection. Holding psychotropic medication. Able to tolerate p.o. diet. CT head unremarkable.  No focal deficit.  3.  Acute stroke. Developed right-sided gaze preference and left-sided weakness in the night of 1/26-1/27. Code stroke was called. Received TPA. Neurology following.  4.  Type II DM, uncontrolled with hyperglycemia with HLD When stable resume Lipitor. Currently on IV insulin. Add LR.  5.  Chronic diastolic CHF Not volume overloaded.  6.   History of COPD Continue inhalers. Does not appear to be in exacerbation right now.  7.  Morbid obesity Body mass index is 44.42 kg/m.  Placing the patient at high risk for poor outcome.  8.  Abdominal pain. X-ray unremarkable.  Monitor.   Lipase level normal Lactic acid normal.  Diet: npo  Advance goals of care discussion: Full code  Family Communication: no family was present at bedside, at the time of interview.   Disposition:  Status is: Inpatient  Remains inpatient appropriate because:Inpatient level of care appropriate due to severity of illness  Dispo: The patient is from: Home              Anticipated d/c is to: SNF              Anticipated d/c date is: > 3 days              Patient currently is not medically stable to d/c.   Difficult to place patient No  Subjective: Minimally responsive.  Not following any commands.  Continues to have shortness of breath.  Intermittently grunts.   Physical Exam:  General: Appear in moderate distress, no Rash; Oral Mucosa Clear, dry. no Abnormal Neck Mass Or lumps, Conjunctiva normal  Cardiovascular: S1 and S2 Present, no Murmur, Respiratory: increased respiratory effort, Bilateral Air entry present and bilateral Crackles, Occasional wheezes Abdomen: Bowel Sound present, Soft and difficult to assess tenderness Extremities: trace Pedal edema Neurology: Lethargic, not oriented, unable to follow commands.  Gait not checked due to patient safety concerns  Vitals:   12/01/20 1400 12/01/20 1435 12/01/20 1614 12/01/20 1615  BP: (!) 162/82  103/62   Pulse: (!) 147  (!) 134   Resp: (!) 25  (!) 32  Temp:      TempSrc:      SpO2: 98% 91% 92% 92%  Weight:      Height:        Intake/Output Summary (Last 24 hours) at 12/01/2020 1720 Last data filed at 12/01/2020 1446 Gross per 24 hour  Intake 890.05 ml  Output 750 ml  Net 140.05 ml   Filed Weights   11/30/20 0723 12/01/20 0056 12/01/20 0406  Weight: 111 kg 96.4 kg 96.4 kg     Data Reviewed: I have personally reviewed and interpreted daily labs, tele strips, imaging. I reviewed all nursing notes, pharmacy notes, vitals, pertinent old records I have discussed plan of care as described above with RN and patient/family.  CBC: Recent Labs  Lab 11/13/2020 1250 11/14/2020 1301 11/30/20 0342 11/30/20 0415 12/01/20 0212 12/01/20 1627  WBC 7.4  --  6.3  --  7.2  --   NEUTROABS 6.0  --  5.0  --   --   --   HGB 12.0 11.6* 12.8 11.2* 12.8 12.2  HCT 37.9 34.0* 37.2 33.0* 39.4 36.0  MCV 84.4  --  82.5  --  81.4  --   PLT 434*  --  442*  --  474*  --    Basic Metabolic Panel: Recent Labs  Lab 11/25/2020 1250 11/13/2020 1301 11/30/20 0342 11/30/20 0415 11/30/20 0751 12/01/20 0212 12/01/20 1627  NA 140 142 141 141 140 140 145  K 3.8 3.8 4.0 3.6 3.7 4.2 3.6  CL 107 109 108  --  107 103  --   CO2 17*  --  17*  --  20* 22  --   GLUCOSE 304* 296* 352*  --  307* 382*  --   BUN 9 9 14   --  13 17  --   CREATININE 0.75 0.50 0.72  --  0.62 0.60  --   CALCIUM 8.3*  --  8.6*  --  8.5* 8.9  --   MG 1.5*  --  1.5*  --   --  1.8  --   PHOS  --   --  2.7  --   --   --   --     Studies: CT Code Stroke CTA Head W/WO contrast  Result Date: 12/01/2020 CLINICAL DATA:  Initial evaluation for acute stroke. EXAM: CT ANGIOGRAPHY HEAD AND NECK TECHNIQUE: Multidetector CT imaging of the head and neck was performed using the standard protocol during bolus administration of intravenous contrast. Multiplanar CT image reconstructions and MIPs were obtained to evaluate the vascular anatomy. Carotid stenosis measurements (when applicable) are obtained utilizing NASCET criteria, using the distal internal carotid diameter as the denominator. CONTRAST:  51mL OMNIPAQUE IOHEXOL 350 MG/ML SOLN COMPARISON:  Prior head CT from earlier the same day. FINDINGS: CTA NECK FINDINGS Aortic arch: Visualized aortic arch of normal caliber with normal 3 vessel morphology. Mild-to-moderate atheromatous change  about the arch and origin of the great vessels without hemodynamically significant stenosis. Right carotid system: Right CCA patent from its origin to the bifurcation without stenosis. Bulky calcified plaque about the right bifurcation/proximal right ICA with associated stenosis of up to 30% by NASCET criteria. There is a focal hypodense filling defect within the proximal right ICA at this level, consistent with thrombus (series 8, image 121). Findings suspected to be related to an ulcerated and/or ruptured atheromatous plaque. This is likely the embolic source. Right ICA patent distally to the skull base without stenosis, dissection or occlusion. Left carotid system: Left  CCA patent from its origin to the bifurcation without stenosis. Mild atheromatous plaque about the left bifurcation without significant stenosis. Left ICA patent distally without stenosis, dissection or occlusion. Vertebral arteries: Both vertebral arteries arise from subclavian arteries. No proximal subclavian artery stenosis. Vertebral arteries patent within the neck without stenosis, dissection or occlusion. Skeleton: No visible acute osseous abnormality. No discrete or worrisome osseous lesions. Prior fusion noted at C4-C7. Patient is largely edentulous. Other neck: No other acute soft tissue abnormality within the neck. No mass or adenopathy. Upper chest: Extensive multifocal ground-glass opacity seen within the partially visualized lungs, concerning for infection/pneumonia. Review of the MIP images confirms the above findings CTA HEAD FINDINGS Anterior circulation: Petrous segments patent bilaterally. Atheromatous plaque within the carotid siphons with associated mild to moderate narrowing on the right and relatively mild narrowing on the left. There is abrupt occlusion at the right ICA terminus, likely embolic (series 7, image 451). Absent flow within the right M1 segment and distal right MCA branches, with relatively little if any  collateralization within the right MCA distribution. A1 segments patent bilaterally. 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 Anterior cerebral arteries patent to their distal aspects without stenosis. Left M1 patent. Normal left MCA bifurcation. Distal left MCA branches well perfused. Posterior circulation: Both V4 segments patent to the vertebrobasilar junction without stenosis. Right vertebral dominant. Both PICA origins patent and normal. Basilar patent to its distal aspect without stenosis. Superior cerebellar arteries patent bilaterally. Both PCAs primarily supplied via the basilar. PCAs remain well perfused to their distal aspects. Venous sinuses: Grossly patent allowing for timing the contrast bolus. Anatomic variants: None significant. Review of the MIP images confirms the above findings IMPRESSION: 1. 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 Absent flow within the right M1 segment and distal right MCA branches, with relatively little if any collateralization within the right MCA distribution. 2. Bulky calcified plaque about the right bifurcation/proximal right ICA with associated stenosis of up to 30% by NASCET criteria. Superimposed focal filling defect at the proximal right ICA consistent with thrombus, likely due to an ulcerated and/or ruptured plaque. This is almost certainly the embolic source. 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 4. Extensive multifocal ground-glass opacity within the partially visualized lungs, consistent with history of COVID pneumonia. Critical Value/emergent results were called by telephone at the time of interpretation on 12/01/2020 at 12:24 am to provider ERIC [facility] , who verbally acknowledged these results. Electronically Signed   By: Jeannine Boga M.D.   On: 12/01/2020 01:28   CT Code Stroke CTA Neck W/WO contrast  Result Date: 12/01/2020 CLINICAL DATA:  Initial evaluation for acute stroke.  EXAM: CT ANGIOGRAPHY HEAD AND NECK TECHNIQUE: Multidetector CT imaging of the head and neck was performed using the standard protocol during bolus administration of intravenous contrast. Multiplanar CT image reconstructions and MIPs were obtained to evaluate the vascular anatomy. Carotid stenosis measurements (when applicable) are obtained utilizing NASCET criteria, using the distal internal carotid diameter as the denominator. CONTRAST:  38mL OMNIPAQUE IOHEXOL 350 MG/ML SOLN COMPARISON:  Prior head CT from earlier the same day. FINDINGS: CTA NECK FINDINGS Aortic arch: Visualized aortic arch of normal caliber with normal 3 vessel morphology. Mild-to-moderate atheromatous change about the arch and origin of the great vessels without hemodynamically significant stenosis. Right carotid system: Right CCA patent from its origin to the bifurcation without stenosis. Bulky calcified plaque about the right bifurcation/proximal right ICA with associated  stenosis of up to 30% by NASCET criteria. There is a focal hypodense filling defect within the proximal right ICA at this level, consistent with thrombus (series 8, image 121). Findings suspected to be related to an ulcerated and/or ruptured atheromatous plaque. This is likely the embolic source. Right ICA patent distally to the skull base without stenosis, dissection or occlusion. Left carotid system: Left CCA patent from its origin to the bifurcation without stenosis. Mild atheromatous plaque about the left bifurcation without significant stenosis. Left ICA patent distally without stenosis, dissection or occlusion. Vertebral arteries: Both vertebral arteries arise from subclavian arteries. No proximal subclavian artery stenosis. Vertebral arteries patent within the neck without stenosis, dissection or occlusion. Skeleton: No visible acute osseous abnormality. No discrete or worrisome osseous lesions. Prior fusion noted at C4-C7. Patient is largely edentulous. Other neck: No  other acute soft tissue abnormality within the neck. No mass or adenopathy. Upper chest: Extensive multifocal ground-glass opacity seen within the partially visualized lungs, concerning for infection/pneumonia. Review of the MIP images confirms the above findings CTA HEAD FINDINGS Anterior circulation: Petrous segments patent bilaterally. Atheromatous plaque within the carotid siphons with associated mild to moderate narrowing on the right and relatively mild narrowing on the left. There is abrupt occlusion at the right ICA terminus, likely embolic (series 7, image 451). Absent flow within the right M1 segment and distal right MCA branches, with relatively little if any collateralization within the right MCA distribution. A1 segments patent bilaterally. 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 Anterior cerebral arteries patent to their distal aspects without stenosis. Left M1 patent. Normal left MCA bifurcation. Distal left MCA branches well perfused. Posterior circulation: Both V4 segments patent to the vertebrobasilar junction without stenosis. Right vertebral dominant. Both PICA origins patent and normal. Basilar patent to its distal aspect without stenosis. Superior cerebellar arteries patent bilaterally. Both PCAs primarily supplied via the basilar. PCAs remain well perfused to their distal aspects. Venous sinuses: Grossly patent allowing for timing the contrast bolus. Anatomic variants: None significant. Review of the MIP images confirms the above findings IMPRESSION: 1. 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 Absent flow within the right M1 segment and distal right MCA branches, with relatively little if any collateralization within the right MCA distribution. 2. Bulky calcified plaque about the right bifurcation/proximal right ICA with associated stenosis of up to 30% by NASCET criteria. Superimposed focal filling defect at the proximal right ICA  consistent with thrombus, likely due to an ulcerated and/or ruptured plaque. This is almost certainly the embolic source. 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 4. Extensive multifocal ground-glass opacity within the partially visualized lungs, consistent with history of COVID pneumonia. Critical Value/emergent results were called by telephone at the time of interpretation on 12/01/2020 at 12:24 am to provider ERIC [facility] , who verbally acknowledged these results. Electronically Signed   By: Jeannine Boga M.D.   On: 12/01/2020 01:28   Portable Chest x-ray  Result Date: 12/01/2020 CLINICAL DATA:  Hypoxia EXAM: PORTABLE CHEST 1 VIEW COMPARISON:  December 01, 2020 study obtained earlier in the day FINDINGS: Endotracheal tube tip is in the proximal right main bronchus. There is a nasogastric tube with tip in the proximal stomach. Side port is slightly superior to the gastroesophageal junction. No pneumothorax. There is atelectatic change in the left base with ill-defined opacity in the medial left base concerning for superimposed pneumonia. Heart is borderline enlarged with pulmonary vascularity normal. No  adenopathy. There is postoperative change in the lower cervical region. IMPRESSION: 1. Endotracheal tube tip is in the proximal right main bronchus. Advise withdrawing endotracheal tube 4-4.5 cm. 2. Nasogastric tube side port is above the gastroesophageal junction. Advise advancing nasogastric tube 4-5 cm. 3. Bibasilar atelectasis with suspected superimposed pneumonia left base. 4.  Stable cardiac prominence. Critical Value/emergent results were called by telephone at the time of interpretation on 12/01/2020 at 1:42 pm to provider [facility] , who verbally acknowledged these results. Electronically Signed   By: Bretta Bang III M.D.   On: 12/01/2020 13:42   DG CHEST PORT 1 VIEW  Result Date: 12/01/2020 CLINICAL DATA:  Follow-up shortness of breath.  COVID. EXAM: PORTABLE CHEST 1 VIEW  COMPARISON:  Chest x-ray 11/23/2020. FINDINGS: Heart size stable. Prominent multifocal bilateral pulmonary infiltrates are again noted. Similar findings noted on prior exam. Small left pleural effusion. No pneumothorax. Prior cervical spine fusion. IMPRESSION: Prominent multifocal bilateral pulmonary infiltrates again noted. Similar findings noted on prior exam. Electronically Signed   By: Maisie Fus  Register   On: 12/01/2020 10:55   CT HEAD CODE STROKE WO CONTRAST  Result Date: 12/01/2020 CLINICAL DATA:  Code stroke. Initial evaluation for acute aphasia, left-sided weakness and lethargy. EXAM: CT HEAD WITHOUT CONTRAST TECHNIQUE: Contiguous axial images were obtained from the base of the skull through the vertex without intravenous contrast. COMPARISON:  Prior head CT from 11/30/2020 FINDINGS: Brain: Examination technically limited by motion artifact. No definite intracranial hemorrhage. There is subtle fogging with loss of cortical sulcation involving a large portion of the right MCA distribution, with involvement of the right insula and adjacent right temporal occipital region, as well as the overlying supra ganglionic cerebral gray matter. Relative sparing of the right basal ganglia and internal capsule. No other acute large vessel territory infarct. No mass lesion, midline shift, or mass effect. No hydrocephalus. No definite extra-axial fluid collection. Vascular: Question focal hyperdensity at the right ICA terminus, which could reflect thrombus (series 2, image 16). Skull: Scalp soft tissues within normal limits. Calvarium grossly intact. Sinuses/Orbits: Globes and orbital soft tissues grossly within normal limits. Right gaze noted. Moderate mucosal thickening noted within the right maxillary sinus, chronic in appearance. Mastoid air cells are clear. Other: None. ASPECTS [facility] Stroke Program Early CT Score) - Ganglionic level infarction (caudate, lentiform nuclei, internal capsule, insula, M1-M3 cortex): 4  - Supraganglionic infarction (M4-M6 cortex): 1 Total score (0-10 with 10 being normal): 5 IMPRESSION: 1. Subtle loss of gray-white matter differentiation and cortical sulcation involving the right MCA distribution as above, concerning for evolving acute right MCA territory infarct. No acute intracranial hemorrhage. 2. ASPECTS is 5. Critical Value/emergent results were called by telephone at the time of interpretation on 12/01/2020 at 12:06 am to provider Dr. Otelia Limes, who verbally acknowledged these results. Electronically Signed   By: Rise Mu M.D.   On: 12/01/2020 00:25   VAS [place] LOWER EXTREMITY VENOUS (DVT)  Result Date: 12/01/2020  Lower Venous DVT Study Indications: Stroke.  Comparison Study: no prior Performing Technologist: Blanch Media RVS  Examination Guidelines: A complete evaluation includes B-mode imaging, spectral Doppler, color Doppler, and power Doppler as needed of all accessible portions of each vessel. Bilateral testing is considered an integral part of a complete examination. Limited examinations for reoccurring indications may be performed as noted. The reflux portion of the exam is performed with the patient in reverse Trendelenburg.  +---------+---------------+---------+-----------+----------+-------------------+ RIGHT    CompressibilityPhasicitySpontaneityPropertiesThrombus Aging      +---------+---------------+---------+-----------+----------+-------------------+ CFV  Full           Yes      Yes                                      +---------+---------------+---------+-----------+----------+-------------------+ SFJ      Full                                                             +---------+---------------+---------+-----------+----------+-------------------+ FV Prox  Full                                                             +---------+---------------+---------+-----------+----------+-------------------+ FV Mid   Full                                                              +---------+---------------+---------+-----------+----------+-------------------+ FV DistalFull                                                             +---------+---------------+---------+-----------+----------+-------------------+ PFV      Full                                                             +---------+---------------+---------+-----------+----------+-------------------+ POP      Full           Yes      Yes                                      +---------+---------------+---------+-----------+----------+-------------------+ PTV      Full                                                             +---------+---------------+---------+-----------+----------+-------------------+ PERO                                                  Not well visualized +---------+---------------+---------+-----------+----------+-------------------+   +---------+---------------+---------+-----------+----------+-------------------+ LEFT     CompressibilityPhasicitySpontaneityPropertiesThrombus Aging      +---------+---------------+---------+-----------+----------+-------------------+ CFV      Full  Yes      Yes                                      +---------+---------------+---------+-----------+----------+-------------------+ SFJ      Full                                                             +---------+---------------+---------+-----------+----------+-------------------+ FV Prox  Full                                                             +---------+---------------+---------+-----------+----------+-------------------+ FV Mid                  Yes      Yes                                      +---------+---------------+---------+-----------+----------+-------------------+ FV Distal               Yes      Yes                                       +---------+---------------+---------+-----------+----------+-------------------+ PFV      Full                                                             +---------+---------------+---------+-----------+----------+-------------------+ POP      Full           Yes      Yes                                      +---------+---------------+---------+-----------+----------+-------------------+ PTV      Full                                                             +---------+---------------+---------+-----------+----------+-------------------+ PERO                                                  Not well visualized +---------+---------------+---------+-----------+----------+-------------------+     Summary: BILATERAL: - No evidence of deep vein thrombosis seen in the lower extremities, bilaterally. - No evidence of superficial venous thrombosis in the lower extremities, bilaterally. -No evidence of popliteal cyst, bilaterally.   *See table(s) above for measurements  and observations. Electronically signed by Servando Snare MD on 12/01/2020 at 4:43:34 PM.    Final     Scheduled Meds: . Chlorhexidine Gluconate Cloth  6 each Topical Daily  . docusate  100 mg Per Tube BID  . etomidate      . fentaNYL      . insulin aspart  3-9 Units Subcutaneous Q4H  . insulin glargine  15 Units Subcutaneous BID  . Ipratropium-Albuterol  1 puff Inhalation Q6H WA  . mouth rinse  15 mL Mouth Rinse BID  . methylPREDNISolone (SOLU-MEDROL) injection  60 mg Intravenous BID  . pantoprazole sodium  40 mg Per Tube Q1200  . polyethylene glycol  17 g Per Tube Daily   Continuous Infusions: . sodium chloride    . cefTRIAXone (ROCEPHIN)  IV 1 g (12/01/20 1659)  . fentaNYL infusion INTRAVENOUS 100 mcg/hr (12/01/20 1446)  . insulin Stopped (12/01/20 1303)  . lactated ringers    . propofol (DIPRIVAN) infusion    . remdesivir 100 mg in NS 100 mL Stopped (12/01/20 1427)   PRN Meds: Place/Maintain arterial line  **AND** sodium chloride, albuterol, dextrose, fentaNYL, midazolam, midazolam  Time spent: 35 minutes  Author: Berle Mull, MD [facility] 12/01/2020   To reach On-call, see care teams to locate the attending and reach out via [email or web address]. Between 7PM-7AM, please contact night-coverage If you still have difficulty reaching the attending provider, please page the [facility] (Director on Call) for [facility] on amion for assistance.

## 2020-12-01 NOTE — Progress Notes (Signed)
Lower extremity venous has been completed.   Preliminary results in CV Proc.   Abram Sander 12/01/2020 2:41 PM

## 2020-12-01 NOTE — Progress Notes (Addendum)
Notified [facility] of elevated blood glucose, requested q4hr insulin coverage, per E-link paged Dr. Cyd Silence to request CCM consult be ordered. At 0206 paged Neuro on call Dr. Cheral Marker for blood pressure parameters following TPA administration. -Per Dr. Cheral Marker keep SBP<180 and DBP<105

## 2020-12-01 NOTE — Progress Notes (Signed)
eLink Physician-Brief Progress Note Patient Name: Sandra Ferrell DOB: August 11, 1961 MRN: [id number]   Date of Service  12/01/2020  HPI/Events of Note  Patient with Covid pneumonia being treated on the medical floor when she developed acute change in her mental status associated with focal left upper extremity weakness, a code stroke was called and, after neurology evaluation, she received TPA.  eICU Interventions  New Patient Evaluation completed.        [name] Zan Triska 12/01/2020, 1:04 AM

## 2020-12-01 NOTE — Progress Notes (Signed)
STROKE TEAM PROGRESS NOTE   SUBJECTIVE (INTERVAL HISTORY) Her RN is at the bedside. Pt had hypoxia and was just intubated not long ago. Pt was sedated and neuromuscular blockade for intubation. Not much I can see from neuro exam at this time. Per note, pt had acute neuro changes and s/p tPA around midnight. CTA head and neck showed right ICA terminus, right MCA occlusion. Pt not IR candidate given poor baseline with mRS = 4.    OBJECTIVE Temp:  [97.5 F (36.4 C)-98.3 F (36.8 C)] 98.3 F (36.8 C) (01/27 0815) Pulse Rate:  [109-147] 138 (01/27 1800) Cardiac Rhythm: Sinus tachycardia (01/27 0400) Resp:  [18-36] 25 (01/27 1800) BP: (89-162)/(55-111) 109/69 (01/27 1800) SpO2:  [87 %-99 %] 89 % (01/27 1800) Arterial Line BP: (118-126)/(66-68) 118/66 (01/27 1800) FiO2 (%):  [100 %] 100 % (01/27 1615) Weight:  [96.4 kg] 96.4 kg (01/27 0406)  Recent Labs  Lab 12/01/20 1124 12/01/20 1235 12/01/20 1403 12/01/20 1654 12/01/20 [place] 115* 124* 183* 284* 301*   Recent Labs  Lab 11/09/2020 1250 11/23/2020 1301 11/30/20 0342 11/30/20 0415 11/30/20 0751 12/01/20 0212 12/01/20 1627  NA 140 142 141 141 140 140 145  K 3.8 3.8 4.0 3.6 3.7 4.2 3.6  CL 107 109 108  --  107 103  --   CO2 17*  --  17*  --  20* 22  --   GLUCOSE 304* 296* 352*  --  307* 382*  --   BUN 9 9 14   --  13 17  --   CREATININE 0.75 0.50 0.72  --  0.62 0.60  --   CALCIUM 8.3*  --  8.6*  --  8.5* 8.9  --   MG 1.5*  --  1.5*  --   --  1.8  --   PHOS  --   --  2.7  --   --   --   --    Recent Labs  Lab 11/13/2020 1250 11/30/20 0342 12/01/20 0212  AST 28 27 23   ALT 21 23 21   ALKPHOS 102 99 102  BILITOT 1.1 1.1 1.0  PROT 6.9 6.9 7.4  ALBUMIN 2.1* 2.2* 2.2*   Recent Labs  Lab 11/24/2020 1250 11/06/2020 1301 11/30/20 0342 11/30/20 0415 12/01/20 0212 12/01/20 1627  WBC 7.4  --  6.3  --  7.2  --   NEUTROABS 6.0  --  5.0  --   --   --   HGB 12.0 11.6* 12.8 11.2* 12.8 12.2  HCT 37.9 34.0* 37.2 33.0* 39.4 36.0   MCV 84.4  --  82.5  --  81.4  --   PLT 434*  --  442*  --  474*  --    No results for input(s): CKTOTAL, CKMB, CKMBINDEX, TROPONINI in the last 168 hours. No results for input(s): LABPROT, INR in the last 72 hours. Recent Labs    12/01/2020 1528  COLORURINE YELLOW  LABSPEC 1.018  PHURINE 6.0  GLUCOSEU >=500*  HGBUR NEGATIVE  BILIRUBINUR NEGATIVE  KETONESUR 80*  PROTEINUR >=300*  NITRITE POSITIVE*  LEUKOCYTESUR LARGE*       Component Value Date/Time   CHOL 170 07/01/2020 1711   TRIG 130 11/23/2020 1250   TRIG 105 10/08/2006 0821   HDL 44 (L) 07/01/2020 1711   CHOLHDL 3.9 07/01/2020 1711   VLDL 15.8 02/26/2018 1008   LDLCALC 96 07/01/2020 1711   Lab Results  Component Value Date   HGBA1C 8.1 (H) 10/07/2020  No results found for: LABOPIA, COCAINSCRNUR, LABBENZ, AMPHETMU, THCU, LABBARB  No results for input(s): ETH in the last 168 hours.  I have personally reviewed the radiological images below and agree with the radiology interpretations.  CT Code Stroke CTA Head W/WO contrast  Result Date: 12/01/2020 CLINICAL DATA:  Initial evaluation for acute stroke. EXAM: CT ANGIOGRAPHY HEAD AND NECK TECHNIQUE: Multidetector CT imaging of the head and neck was performed using the standard protocol during bolus administration of intravenous contrast. Multiplanar CT image reconstructions and MIPs were obtained to evaluate the vascular anatomy. Carotid stenosis measurements (when applicable) are obtained utilizing NASCET criteria, using the distal internal carotid diameter as the denominator. CONTRAST:  42mL OMNIPAQUE IOHEXOL 350 MG/ML SOLN COMPARISON:  Prior head CT from earlier the same day. FINDINGS: CTA NECK FINDINGS Aortic arch: Visualized aortic arch of normal caliber with normal 3 vessel morphology. Mild-to-moderate atheromatous change about the arch and origin of the great vessels without hemodynamically significant stenosis. Right carotid system: Right CCA patent from its origin to the  bifurcation without stenosis. Bulky calcified plaque about the right bifurcation/proximal right ICA with associated stenosis of up to 30% by NASCET criteria. There is a focal hypodense filling defect within the proximal right ICA at this level, consistent with thrombus (series 8, image 121). Findings suspected to be related to an ulcerated and/or ruptured atheromatous plaque. This is likely the embolic source. Right ICA patent distally to the skull base without stenosis, dissection or occlusion. Left carotid system: Left CCA patent from its origin to the bifurcation without stenosis. Mild atheromatous plaque about the left bifurcation without significant stenosis. Left ICA patent distally without stenosis, dissection or occlusion. Vertebral arteries: Both vertebral arteries arise from subclavian arteries. No proximal subclavian artery stenosis. Vertebral arteries patent within the neck without stenosis, dissection or occlusion. Skeleton: No visible acute osseous abnormality. No discrete or worrisome osseous lesions. Prior fusion noted at C4-C7. Patient is largely edentulous. Other neck: No other acute soft tissue abnormality within the neck. No mass or adenopathy. Upper chest: Extensive multifocal ground-glass opacity seen within the partially visualized lungs, concerning for infection/pneumonia. Review of the MIP images confirms the above findings CTA HEAD FINDINGS Anterior circulation: Petrous segments patent bilaterally. Atheromatous plaque within the carotid siphons with associated mild to moderate narrowing on the right and relatively mild narrowing on the left. There is abrupt occlusion at the right ICA terminus, likely embolic (series 7, image 451). Absent flow within the right M1 segment and distal right MCA branches, with relatively little if any collateralization within the right MCA distribution. A1 segments patent bilaterally. 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  Anterior cerebral arteries patent to their distal aspects without stenosis. Left M1 patent. Normal left MCA bifurcation. Distal left MCA branches well perfused. Posterior circulation: Both V4 segments patent to the vertebrobasilar junction without stenosis. Right vertebral dominant. Both PICA origins patent and normal. Basilar patent to its distal aspect without stenosis. Superior cerebellar arteries patent bilaterally. Both PCAs primarily supplied via the basilar. PCAs remain well perfused to their distal aspects. Venous sinuses: Grossly patent allowing for timing the contrast bolus. Anatomic variants: None significant. Review of the MIP images confirms the above findings IMPRESSION: 1. 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 Absent flow within the right M1 segment and distal right MCA branches, with relatively little if any collateralization within the right MCA distribution. 2. Bulky calcified plaque about the right bifurcation/proximal right ICA with  associated stenosis of up to 30% by NASCET criteria. Superimposed focal filling defect at the proximal right ICA consistent with thrombus, likely due to an ulcerated and/or ruptured plaque. This is almost certainly the embolic source. 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 4. Extensive multifocal ground-glass opacity within the partially visualized lungs, consistent with history of COVID pneumonia. Critical Value/emergent results were called by telephone at the time of interpretation on 12/01/2020 at 12:24 am to provider ERIC [facility] , who verbally acknowledged these results. Electronically Signed   By: Jeannine Boga M.D.   On: 12/01/2020 01:28   CT HEAD WO CONTRAST  Result Date: 11/30/2020 CLINICAL DATA:  Mental status changes EXAM: CT HEAD WITHOUT CONTRAST TECHNIQUE: Contiguous axial images were obtained from the base of the skull through the vertex without intravenous contrast. COMPARISON:  12/31/2019 FINDINGS: Brain:  Minor white matter microvascular ischemic changes about lateral ventricles throughout both cerebral hemispheres. No acute intracranial hemorrhage infarction, midline shift hydrocephalus, or extra-axial collection. No focal mass effect. Cisterns are patent. No cerebellar abnormality. Vascular: No hyperdense vessel or unexpected calcification. Skull: Normal. Negative for fracture or focal lesion. Sinuses/Orbits: Orbits are unremarkable. Chronic maxillary sinus disease bilaterally. Other: None. IMPRESSION: Minor chronic white matter microvascular changes. No acute intracranial abnormality by noncontrast CT. Electronically Signed   By: Jerilynn Mages.  Shick M.D.   On: 11/30/2020 13:48   CT Code Stroke CTA Neck W/WO contrast  Result Date: 12/01/2020 CLINICAL DATA:  Initial evaluation for acute stroke. EXAM: CT ANGIOGRAPHY HEAD AND NECK TECHNIQUE: Multidetector CT imaging of the head and neck was performed using the standard protocol during bolus administration of intravenous contrast. Multiplanar CT image reconstructions and MIPs were obtained to evaluate the vascular anatomy. Carotid stenosis measurements (when applicable) are obtained utilizing NASCET criteria, using the distal internal carotid diameter as the denominator. CONTRAST:  58mL OMNIPAQUE IOHEXOL 350 MG/ML SOLN COMPARISON:  Prior head CT from earlier the same day. FINDINGS: CTA NECK FINDINGS Aortic arch: Visualized aortic arch of normal caliber with normal 3 vessel morphology. Mild-to-moderate atheromatous change about the arch and origin of the great vessels without hemodynamically significant stenosis. Right carotid system: Right CCA patent from its origin to the bifurcation without stenosis. Bulky calcified plaque about the right bifurcation/proximal right ICA with associated stenosis of up to 30% by NASCET criteria. There is a focal hypodense filling defect within the proximal right ICA at this level, consistent with thrombus (series 8, image 121). Findings  suspected to be related to an ulcerated and/or ruptured atheromatous plaque. This is likely the embolic source. Right ICA patent distally to the skull base without stenosis, dissection or occlusion. Left carotid system: Left CCA patent from its origin to the bifurcation without stenosis. Mild atheromatous plaque about the left bifurcation without significant stenosis. Left ICA patent distally without stenosis, dissection or occlusion. Vertebral arteries: Both vertebral arteries arise from subclavian arteries. No proximal subclavian artery stenosis. Vertebral arteries patent within the neck without stenosis, dissection or occlusion. Skeleton: No visible acute osseous abnormality. No discrete or worrisome osseous lesions. Prior fusion noted at C4-C7. Patient is largely edentulous. Other neck: No other acute soft tissue abnormality within the neck. No mass or adenopathy. Upper chest: Extensive multifocal ground-glass opacity seen within the partially visualized lungs, concerning for infection/pneumonia. Review of the MIP images confirms the above findings CTA HEAD FINDINGS Anterior circulation: Petrous segments patent bilaterally. Atheromatous plaque within the carotid siphons with associated mild to moderate narrowing on the right and relatively mild narrowing on the left.  There is abrupt occlusion at the right ICA terminus, likely embolic (series 7, image 451). Absent flow within the right M1 segment and distal right MCA branches, with relatively little if any collateralization within the right MCA distribution. A1 segments patent bilaterally. 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 Anterior cerebral arteries patent to their distal aspects without stenosis. Left M1 patent. Normal left MCA bifurcation. Distal left MCA branches well perfused. Posterior circulation: Both V4 segments patent to the vertebrobasilar junction without stenosis. Right vertebral dominant. Both PICA origins  patent and normal. Basilar patent to its distal aspect without stenosis. Superior cerebellar arteries patent bilaterally. Both PCAs primarily supplied via the basilar. PCAs remain well perfused to their distal aspects. Venous sinuses: Grossly patent allowing for timing the contrast bolus. Anatomic variants: None significant. Review of the MIP images confirms the above findings IMPRESSION: 1. 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 Absent flow within the right M1 segment and distal right MCA branches, with relatively little if any collateralization within the right MCA distribution. 2. Bulky calcified plaque about the right bifurcation/proximal right ICA with associated stenosis of up to 30% by NASCET criteria. Superimposed focal filling defect at the proximal right ICA consistent with thrombus, likely due to an ulcerated and/or ruptured plaque. This is almost certainly the embolic source. 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 4. Extensive multifocal ground-glass opacity within the partially visualized lungs, consistent with history of COVID pneumonia. Critical Value/emergent results were called by telephone at the time of interpretation on 12/01/2020 at 12:24 am to provider ERIC [facility] , who verbally acknowledged these results. Electronically Signed   By: Jeannine Boga M.D.   On: 12/01/2020 01:28   Portable Chest x-ray  Result Date: 12/01/2020 CLINICAL DATA:  Hypoxia EXAM: PORTABLE CHEST 1 VIEW COMPARISON:  December 01, 2020 study obtained earlier in the day FINDINGS: Endotracheal tube tip is in the proximal right main bronchus. There is a nasogastric tube with tip in the proximal stomach. Side port is slightly superior to the gastroesophageal junction. No pneumothorax. There is atelectatic change in the left base with ill-defined opacity in the medial left base concerning for superimposed pneumonia. Heart is borderline enlarged with pulmonary vascularity normal. No  adenopathy. There is postoperative change in the lower cervical region. IMPRESSION: 1. Endotracheal tube tip is in the proximal right main bronchus. Advise withdrawing endotracheal tube 4-4.5 cm. 2. Nasogastric tube side port is above the gastroesophageal junction. Advise advancing nasogastric tube 4-5 cm. 3. Bibasilar atelectasis with suspected superimposed pneumonia left base. 4.  Stable cardiac prominence. Critical Value/emergent results were called by telephone at the time of interpretation on 12/01/2020 at 1:42 pm to provider [facility] , who verbally acknowledged these results. Electronically Signed   By: Lowella Grip III M.D.   On: 12/01/2020 13:42   DG CHEST PORT 1 VIEW  Result Date: 12/01/2020 CLINICAL DATA:  Follow-up shortness of breath.  COVID. EXAM: PORTABLE CHEST 1 VIEW COMPARISON:  Chest x-ray 11/24/2020. FINDINGS: Heart size stable. Prominent multifocal bilateral pulmonary infiltrates are again noted. Similar findings noted on prior exam. Small left pleural effusion. No pneumothorax. Prior cervical spine fusion. IMPRESSION: Prominent multifocal bilateral pulmonary infiltrates again noted. Similar findings noted on prior exam. Electronically Signed   By: [place]   On: 12/01/2020 10:55   DG Chest Port 1 View  Result Date: 11/27/2020 CLINICAL DATA:  Shortness of breath EXAM: PORTABLE CHEST 1 VIEW COMPARISON:  07/31/2019  FINDINGS: Cardiac shadow is within normal limits. Patchy airspace opacity is noted bilaterally consistent with multifocal pneumonia. Correlate with COVID testing. No effusion is seen. Postsurgical changes in the cervical spine are noted. IMPRESSION: Increased bilateral airspace opacities consistent with multifocal pneumonia. Correlate with COVID-19 testing. Electronically Signed   By: Inez Catalina M.D.   On: 12-15-20 12:47   DG Abd Portable 1V  Result Date: 11/30/2020 CLINICAL DATA:  Abdominal pain, history of COVID-19 positivity EXAM: PORTABLE ABDOMEN - 1  VIEW COMPARISON:  10/08/2020 FINDINGS: Postsurgical changes are noted in the left femur and lumbar spine. Mild degenerative changes of lumbar spine are seen. Scattered large and small bowel gas is noted. No free air is seen. Changes of prior cholecystectomy are noted. IMPRESSION: No acute abnormality noted. Electronically Signed   By: Inez Catalina M.D.   On: 11/30/2020 09:57   ECHOCARDIOGRAM COMPLETE  Result Date: 12/01/2020    ECHOCARDIOGRAM REPORT   Patient Name:   CALAYA GILDNER Date of Exam: 12/01/2020 Medical Rec #:  [id number]        Height:       58.0 in Accession #:    [id number]       Weight:       212.5 lb Date of Birth:  [birth year]         BSA:          1.869 m Patient Age:    61 years         BP:           161/100 mmHg Patient Gender: F                HR:           129 bpm. Exam Location:  Inpatient Procedure: 2D Echo, Cardiac Doppler, Color Doppler and Intracardiac            Opacification Agent Indications:    Stroke 434.91 / I163.9  History:        Patient has prior history of Echocardiogram examinations, most                 recent 03/01/2011. CHF, COPD; Risk Factors:Hypertension,                 Diabetes, Dyslipidemia, Sleep Apnea and Current Smoker. Anemia.                 COVID-19.  Sonographer:    Darlina Sicilian RDCS Referring Phys: [id number] [facility] Joselynne Killam  Sonographer Comments: Technically difficult study due to poor echo windows. IMPRESSIONS  1. Left ventricular ejection fraction, by estimation, is 55%. The left ventricle has normal function. Left ventricular endocardial border not optimally defined to evaluate regional wall motion. Indeterminate diastolic filling due to E-A fusion.  2. Right ventricular systolic function is normal. The right ventricular size is normal. There is moderately elevated pulmonary artery systolic pressure. The estimated right ventricular systolic pressure is 29.9 mmHg.  3. The mitral valve is normal in structure. No evidence of mitral valve regurgitation. No evidence of  mitral stenosis.  4. Tricuspid valve regurgitation is moderate.  5. The aortic valve is tricuspid. Aortic valve regurgitation is not visualized. Mild aortic valve sclerosis is present, with no evidence of aortic valve stenosis.  6. The inferior vena cava is normal in size with greater than 50% respiratory variability, suggesting right atrial pressure of 3 mmHg. FINDINGS  Left Ventricle: Left ventricular ejection fraction, by estimation, is 55%. The left ventricle has normal function. Left  ventricular endocardial border not optimally defined to evaluate regional wall motion. The left ventricular internal cavity size was normal in size. There is no left ventricular hypertrophy. Indeterminate diastolic filling due to E-A fusion. Right Ventricle: The right ventricular size is normal. No increase in right ventricular wall thickness. Right ventricular systolic function is normal. There is moderately elevated pulmonary artery systolic pressure. The tricuspid regurgitant velocity is 3.26 m/s, and with an assumed right atrial pressure of 3 mmHg, the estimated right ventricular systolic pressure is [id number] mmHg. Left Atrium: Left atrial size was normal in size. Right Atrium: Right atrial size was normal in size. Pericardium: There is no evidence of pericardial effusion. Mitral Valve: The mitral valve is normal in structure. No evidence of mitral valve regurgitation. No evidence of mitral valve stenosis. Tricuspid Valve: The tricuspid valve is normal in structure. Tricuspid valve regurgitation is moderate. Aortic Valve: The aortic valve is tricuspid. Aortic valve regurgitation is not visualized. Mild aortic valve sclerosis is present, with no evidence of aortic valve stenosis. Pulmonic Valve: The pulmonic valve was normal in structure. Pulmonic valve regurgitation is not visualized. Aorta: The aortic root is normal in size and structure. Venous: The inferior vena cava is normal in size with greater than 50% respiratory  variability, suggesting right atrial pressure of 3 mmHg. IAS/Shunts: No atrial level shunt detected by color flow Doppler.  LEFT VENTRICLE PLAX 2D LVIDd:         4.50 cm LVIDs:         3.50 cm LV PW:         1.00 cm LV IVS:        0.80 cm LVOT diam:     1.90 cm LV SV:         43 LV SV Index:   23 LVOT Area:     2.84 cm  LV Volumes (MOD) LV vol d, MOD A2C: 46.9 ml LV vol d, MOD A4C: 67.9 ml LV vol s, MOD A2C: 21.6 ml LV vol s, MOD A4C: 17.8 ml LV SV MOD A2C:     25.3 ml LV SV MOD A4C:     67.9 ml LV SV MOD BP:      35.1 ml RIGHT VENTRICLE RV S prime:     9.79 cm/s LEFT ATRIUM             Index LA diam:        3.60 cm 1.93 cm/m LA Vol (A2C):   26.2 ml 14.02 ml/m LA Vol (A4C):   36.5 ml 19.53 ml/m LA Biplane Vol: 32.2 ml 17.23 ml/m  AORTIC VALVE LVOT Vmax:   96.90 cm/s LVOT Vmean:  64.800 cm/s LVOT VTI:    0.152 m  AORTA Ao Root diam: 2.90 cm Ao Asc diam:  3.20 cm TRICUSPID VALVE TR Peak grad:   42.5 mmHg TR Vmax:        326.00 cm/s  SHUNTS Systemic VTI:  0.15 m Systemic Diam: 1.90 cm Loralie Champagne MD Electronically signed by Loralie Champagne MD Signature Date/Time: 12/01/2020/6:39:48 PM    Final    CT HEAD CODE STROKE WO CONTRAST  Result Date: 12/01/2020 CLINICAL DATA:  Code stroke. Initial evaluation for acute aphasia, left-sided weakness and lethargy. EXAM: CT HEAD WITHOUT CONTRAST TECHNIQUE: Contiguous axial images were obtained from the base of the skull through the vertex without intravenous contrast. COMPARISON:  Prior head CT from 11/30/2020 FINDINGS: Brain: Examination technically limited by motion artifact. No definite intracranial hemorrhage. There is subtle fogging with  loss of cortical sulcation involving a large portion of the right MCA distribution, with involvement of the right insula and adjacent right temporal occipital region, as well as the overlying supra ganglionic cerebral gray matter. Relative sparing of the right basal ganglia and internal capsule. No other acute large vessel territory  infarct. No mass lesion, midline shift, or mass effect. No hydrocephalus. No definite extra-axial fluid collection. Vascular: Question focal hyperdensity at the right ICA terminus, which could reflect thrombus (series 2, image 16). Skull: Scalp soft tissues within normal limits. Calvarium grossly intact. Sinuses/Orbits: Globes and orbital soft tissues grossly within normal limits. Right gaze noted. Moderate mucosal thickening noted within the right maxillary sinus, chronic in appearance. Mastoid air cells are clear. Other: None. ASPECTS [facility] Stroke Program Early CT Score) - Ganglionic level infarction (caudate, lentiform nuclei, internal capsule, insula, M1-M3 cortex): 4 - Supraganglionic infarction (M4-M6 cortex): 1 Total score (0-10 with 10 being normal): 5 IMPRESSION: 1. Subtle loss of gray-white matter differentiation and cortical sulcation involving the right MCA distribution as above, concerning for evolving acute right MCA territory infarct. No acute intracranial hemorrhage. 2. ASPECTS is 5. Critical Value/emergent results were called by telephone at the time of interpretation on 12/01/2020 at 12:06 am to provider Dr. Cheral Marker, who verbally acknowledged these results. Electronically Signed   By: Jeannine Boga M.D.   On: 12/01/2020 00:25   VAS [place] LOWER EXTREMITY VENOUS (DVT)  Result Date: 12/01/2020  Lower Venous DVT Study Indications: Stroke.  Comparison Study: no prior Performing Technologist: Abram Sander RVS  Examination Guidelines: A complete evaluation includes B-mode imaging, spectral Doppler, color Doppler, and power Doppler as needed of all accessible portions of each vessel. Bilateral testing is considered an integral part of a complete examination. Limited examinations for reoccurring indications may be performed as noted. The reflux portion of the exam is performed with the patient in reverse Trendelenburg.   +---------+---------------+---------+-----------+----------+-------------------+ RIGHT    CompressibilityPhasicitySpontaneityPropertiesThrombus Aging      +---------+---------------+---------+-----------+----------+-------------------+ CFV      Full           Yes      Yes                                      +---------+---------------+---------+-----------+----------+-------------------+ SFJ      Full                                                             +---------+---------------+---------+-----------+----------+-------------------+ FV Prox  Full                                                             +---------+---------------+---------+-----------+----------+-------------------+ FV Mid   Full                                                             +---------+---------------+---------+-----------+----------+-------------------+  FV DistalFull                                                             +---------+---------------+---------+-----------+----------+-------------------+ PFV      Full                                                             +---------+---------------+---------+-----------+----------+-------------------+ POP      Full           Yes      Yes                                      +---------+---------------+---------+-----------+----------+-------------------+ PTV      Full                                                             +---------+---------------+---------+-----------+----------+-------------------+ PERO                                                  Not well visualized +---------+---------------+---------+-----------+----------+-------------------+   +---------+---------------+---------+-----------+----------+-------------------+ LEFT     CompressibilityPhasicitySpontaneityPropertiesThrombus Aging      +---------+---------------+---------+-----------+----------+-------------------+  CFV      Full           Yes      Yes                                      +---------+---------------+---------+-----------+----------+-------------------+ SFJ      Full                                                             +---------+---------------+---------+-----------+----------+-------------------+ FV Prox  Full                                                             +---------+---------------+---------+-----------+----------+-------------------+ FV Mid                  Yes      Yes                                      +---------+---------------+---------+-----------+----------+-------------------+ FV Distal  Yes      Yes                                      +---------+---------------+---------+-----------+----------+-------------------+ PFV      Full                                                             +---------+---------------+---------+-----------+----------+-------------------+ POP      Full           Yes      Yes                                      +---------+---------------+---------+-----------+----------+-------------------+ PTV      Full                                                             +---------+---------------+---------+-----------+----------+-------------------+ PERO                                                  Not well visualized +---------+---------------+---------+-----------+----------+-------------------+     Summary: BILATERAL: - No evidence of deep vein thrombosis seen in the lower extremities, bilaterally. - No evidence of superficial venous thrombosis in the lower extremities, bilaterally. -No evidence of popliteal cyst, bilaterally.   *See table(s) above for measurements and observations. Electronically signed by Servando Snare MD on 12/01/2020 at 4:43:34 PM.    Final     PHYSICAL EXAM  Temp:  [97.5 F (36.4 C)-98.3 F (36.8 C)] 98.3 F (36.8 C) (01/27 0815) Pulse Rate:   [109-147] 138 (01/27 1800) Resp:  [18-36] 25 (01/27 1800) BP: (89-162)/(55-111) 109/69 (01/27 1800) SpO2:  [87 %-99 %] 89 % (01/27 1800) Arterial Line BP: (118-126)/(66-68) 118/66 (01/27 1800) FiO2 (%):  [100 %] 100 % (01/27 1615) Weight:  [96.4 kg] 96.4 kg (01/27 0406)  General - Well nourished, well developed, intubated on sedation and neuromuscular blockade.  Ophthalmologic - fundi not visualized due to noncooperation.  Cardiovascular - Regular rhythm but tachycardia.  Neuro - intubated on sedation, eyes closed, not following commands. With forced eye opening, eyes in mid position, not blinking to visual threat, doll's eyes absent, not tracking, PERRL. Corneal reflex absent, gag and cough absent. Breathing not over the vent.  Facial symmetry not able to test due to ET tube.  Tongue protrusion not cooperative. On pain stimulation, no movement in all extremities. DTR diminished and no babinski. Sensation, coordination and gait not tested.   ASSESSMENT/PLAN Ms. FELMA SANTOSO is a 60 y.o. female with history of CHF, chronic pain syndrome, COPD, depression, wheelchair-bound since left hip/femur fracture in 2019, hypertension, hyperlipidemia, smoker, diabetes, morbid obesity and sacral decubital ulcer admitted for Covid pneumonia.  Had some confusion, agitation, altered mental status prior admission.  However, last night, patient developed acute onset aphasia, left-sided  weakness, right gaze and left facial droop.  Code stroke activated, CT decreased gray-white matter differentiation on the right.  CTA head and neck right ICA terminus and M1 occlusion.  Status post TPA.  Patient not IR candidate due to poor baseline mRS = 4.  Stroke:  right MCA large infarct status post TPA, etiology unclear, could be due to large vessel source, occult Afib, or hypercoagulable state from COVID  CT 1/27 pm No acute abnormality  CT 1/28 midnight right MCA decreased gray-white matter differentiation  CT head  and neck right ICA terminus and M1 occlusion.  Right proximal ICA calcified and soft plaque with stenosis up to 30%. Superimposed focal filling defect at the proximal right ICA consistent with thrombus, likely due to an ulcerated and/or ruptured plaque. This is almost certainly the embolic source.  MRI  pending  MRA  pending  2D Echo EF 55%  LE venous Doppler no DVT  LDL pending  HgbA1c pending  SCDs for VTE prophylaxis  No antithrombotic prior to admission, now on No antithrombotic within 24 hours of TPA  Ongoing aggressive stroke risk factor management  Therapy recommendations: Pending  Disposition: Pending  Respiratory failure Covid pneumonia  Admitted with Covid symptoms  Covid PCR positive  On remdesivir  On Solu-Medrol  Intubated due to hypoxia  CCM on board  Uncontrolled diabetes  HgbA1c pending goal < 7.0  Uncontrolled  Severe hyperglycemia  Currently on insulin drip  CBG monitoring  SSI  CCM on board  Hypertension . Home meds including Norvasc Lasix . Stable . BP goal less than 180/105 after TPA  Long term BP goal normotensive  Hyperlipidemia  Home meds: Lipitor 10  LDL pending, goal < 70  Now on Lipitor 40  Continue statin at discharge  Tobacco abuse  Current smoker  Smoking cessation counseling will be provided  Other Stroke Risk Factors  Morbid obesity, Body mass index is 44.42 kg/m.   Other Active Problems  Poor baseline - wheelchair-bound since left femur/hip fracture/trauma in 2019  Sacral decubitus ulcer  COPD  Depression  Chronic pain syndrome  Hospital day # 2  This patient is critically ill due to large right MCA stroke status post TPA, respirate failure needing intubation, Covid pneumonia with hypoxia, severe hyperglycemia needing insulin drip and at significant risk of neurological worsening, death form recurrent stroke, hemorrhagic conversion, bleeding from TPA, cerebral edema, seizure, heart  failure, DKA, cardio arrest. This patient's care requires constant monitoring of vital signs, hemodynamics, respiratory and cardiac monitoring, review of multiple databases, neurological assessment, discussion with family, other specialists and medical decision making of high complexity. I spent 35 minutes of neurocritical care time in the care of this patient.  I discussed with CCM Dr. Tamala Julian.  Rosalin Hawking, MD PhD Stroke Neurology 12/01/2020 7:05 PM    To contact Stroke Continuity provider, please refer to [email or web address] After hours, contact General Neurology

## 2020-12-01 NOTE — Progress Notes (Signed)
[facility] Progress Note Patient Name: Sandra Ferrell DOB: Mar 30, 1961 MRN: [id number]   Date of Service  12/01/2020  HPI/Events of Note  Patient with new neurological change, asymetric pupils.  eICU Interventions  Stat CT brain to r/o ICH s/p TPA.        Frederik Pear 12/01/2020, 8:41 PM

## 2020-12-01 NOTE — Consult Note (Signed)
NAME:  Sandra Ferrell, MRN:  [id number], DOB:  May 18, 1961, LOS: 2 ADMISSION DATE:  11/14/2020, CONSULTATION DATE:  12/01/2020  REFERRING MD:  Posey Pronto, [facility], CHIEF COMPLAINT:  resp distress   Brief History:  60 year old unvaccinated diabetic with COPD, admitted with severe Covid pneumonia 9/47 Course complicated by right hemiplegia 1/27 early a.m., given TPA PCCM consulted for respiratory distress requiring emergent intubation  History of Present Illness:  She was admitted 1/25 with progressive shortness of breath over 5 to 6 days and fevers 102.  She required nonrebreather on admission and chest x-ray showed bilateral patchy airspace disease .  She was treated with remdesivir steroids and developed hyperglycemia requiring insulin drip. On 1/26 she developed right-sided gaze preference with flaccid left arm, NIH stroke scale 28, CT showed no hemorrhage concern for right MCA territory infarct with CT angiogram to history of LVO right ICA terminus occlusion, TPA was given on 1/27 and 00 20.  Felt not to be a candidate for IR.  Transferred from the floor to 3M08.  Insulin drip started 1/27 PCCM consulted this morning for respiratory distress  Past Medical History:  COPD Diabetes type 2, Hypertension Chronic diastolic heart failure Generalized anxiety disorder Ex-Smoker, quit 2019, 35 pack years  Significant Hospital Events:  1/27 early a.m. code stroke  Consults:  Neurology  Procedures:  ETT 1/27 >>  Significant Diagnostic Tests:  Head  CT angio 1/27 right MCA territory infarct with the right ICA terminus occlusion  Micro Data:  1/27 resp >> 1/25 BC >> ng  Antimicrobials:  unasyn 1/27 >> remdesevir 1/25 >>   Interim History / Subjective:    Objective   Blood pressure (!) 156/82, pulse (!) 117, temperature 98.3 F (36.8 C), temperature source Axillary, resp. rate (!) 30, height $RemoveBe'4\' 10"'[id number]$  (1.473 m), weight 96.4 kg, SpO2 92 %.        Intake/Output Summary (Last 24 hours) at  12/01/2020 1317 Last data filed at 12/01/2020 1000 Gross per 24 hour  Intake 767.72 ml  Output 750 ml  Net 17.72 ml   Filed Weights   11/30/20 0723 12/01/20 0056 12/01/20 0406  Weight: 111 kg 96.4 kg 96.4 kg    Examination: General: Middle-aged woman unresponsive, supine , moderate respiratory distress HENT: On nonrebreather and high flow nasal cannula, no JVD, edentulous Lungs: Bilateral air entry, faint scattered rhonchi, accessory muscle use Cardiovascular: S1-S2 tacky Abdomen: Soft nontender Extremities: No edema, no rash Neuro: Unresponsive to verbal commands, purposeful with right arm, flaccid left arm decreased movement left leg   Chest x-ray independently reviewed which shows bibasal infiltrates and cardiomegaly Labs show normal electrolytes, hyperglycemia, no leukocytosis  Resolved Hospital Problem list     Assessment & Plan:  Covid ARDS unfortunately complicated by acute RT MCA CVA   ARDS due to Covid pneumonia Continue low tidal volume ventilation per ARDS protocol Target TV 8cc/kgIBW, starting at PEEP of 14 Target Plateau Pressure < 30cm H20 Target driving pressure less than 15 cm of water ,Target PaO2 55-65: titrate PEEP/FiO2 per protocol Consider prone position 16h/day  while PaO2 to FiO2 ratio remains less than 1:150  No CVP currently , will place central line 1/28 since TPA just given early a.m. on 1/27 Use propofol and fentanyl for sedation, goal RASS -2 to -3 Chest x-ray and ABG will be reviewed   COVID-19 positive test (U07.1, COVID-19) with Acute Pneumonia (J12.89, Other viral pneumonia) (If respiratory failure or sepsis present, add as separate assessment)  -Remdesivir and steroids , will  decrease steroid dosing due to hyperglycemia -Add empiric Unasyn for aspiration  Acute CVA -will need follow-up head CT 24 hours after TPA Antiplatelet versus IV heparin per neurology  Hyperglycemia due to steroids -IV insulin drip        Best practice  (evaluated daily)  Diet: Tube feeds Pain/Anxiety/Delirium protocol (if indicated): Propofol/fentanyl VAP protocol (if indicated): Y DVT prophylaxis: Lovenox GI prophylaxis: Protonix Glucose control: Insulin drip Mobility: Bedrest Disposition: ICU  Goals of Care:  Last date of multidisciplinary goals of care discussion: N/A Family and staff present: N/A Summary of discussion: N/A Follow up goals of care discussion due: N/A Code Status: Full  Labs   CBC: Recent Labs  Lab 11/16/2020 1250 11/21/2020 1301 11/30/20 0342 11/30/20 0415 12/01/20 0212  WBC 7.4  --  6.3  --  7.2  NEUTROABS 6.0  --  5.0  --   --   HGB 12.0 11.6* 12.8 11.2* 12.8  HCT 37.9 34.0* 37.2 33.0* 39.4  MCV 84.4  --  82.5  --  81.4  PLT 434*  --  442*  --  474*    Basic Metabolic Panel: Recent Labs  Lab 11/06/2020 1250 11/24/2020 1301 11/30/20 0342 11/30/20 0415 11/30/20 0751 12/01/20 0212  NA 140 142 141 141 140 140  K 3.8 3.8 4.0 3.6 3.7 4.2  CL 107 109 108  --  107 103  CO2 17*  --  17*  --  20* 22  GLUCOSE 304* 296* 352*  --  307* 382*  BUN 9 9 14   --  13 17  CREATININE 0.75 0.50 0.72  --  0.62 0.60  CALCIUM 8.3*  --  8.6*  --  8.5* 8.9  MG 1.5*  --  1.5*  --   --  1.8  PHOS  --   --  2.7  --   --   --    GFR: Estimated Creatinine Clearance: 74.5 mL/min (by C-G formula based on SCr of 0.6 mg/dL). Recent Labs  Lab 11/05/2020 1250 11/30/20 0342 11/30/20 1535 12/01/20 0212  PROCALCITON <0.10  --   --   --   WBC 7.4 6.3  --  7.2  LATICACIDVEN 0.9  --  1.3  --     Liver Function Tests: Recent Labs  Lab 11/22/2020 1250 11/30/20 0342 12/01/20 0212  AST 28 27 23   ALT 21 23 21   ALKPHOS 102 99 102  BILITOT 1.1 1.1 1.0  PROT 6.9 6.9 7.4  ALBUMIN 2.1* 2.2* 2.2*   Recent Labs  Lab 11/30/20 0751  LIPASE 19   No results for input(s): AMMONIA in the last 168 hours.  ABG    Component Value Date/Time   PHART 7.423 11/30/2020 2345   PCO2ART 34.8 11/30/2020 2345   PO2ART 72.2 (L) 11/30/2020  2345   HCO3 22.3 11/30/2020 2345   TCO2 19 (L) 11/30/2020 0415   ACIDBASEDEF 1.5 11/30/2020 2345   O2SAT 94.1 11/30/2020 2345     Coagulation Profile: No results for input(s): INR, PROTIME in the last 168 hours.  Cardiac Enzymes: No results for input(s): CKTOTAL, CKMB, CKMBINDEX, TROPONINI in the last 168 hours.  HbA1C: Hgb A1c MFr Bld  Date/Time Value Ref Range Status  10/07/2020 03:56 PM 8.1 (H) 4.8 - 5.6 % Final    Comment:    (NOTE) Pre diabetes:          5.7%-6.4%  Diabetes:              >6.4%  Glycemic control  for   <7.0% adults with diabetes   07/01/2020 05:11 PM 10.9 (H) <5.7 % of total Hgb Final    Comment:    For someone without known diabetes, a hemoglobin A1c value of 6.5% or greater indicates that they may have  diabetes and this should be confirmed with a follow-up  test. . For someone with known diabetes, a value <7% indicates  that their diabetes is well controlled and a value  greater than or equal to 7% indicates suboptimal  control. A1c targets should be individualized based on  duration of diabetes, age, comorbid conditions, and  other considerations. . Currently, no consensus exists regarding use of hemoglobin A1c for diagnosis of diabetes for children. .     CBG: Recent Labs  Lab 12/01/20 0756 12/01/20 0859 12/01/20 1006 12/01/20 1124 12/01/20 1235  GLUCAP 147* 130* 137* 115* 124*    Review of Systems:   Unable to obtain  Past Medical History:  She,  has a past medical history of Allergic rhinitis, cause unspecified, Anemia, Anxiety state, unspecified, Backache, unspecified, Carpal tunnel syndrome, right, CHF (congestive heart failure) (HCC), Chronic pain syndrome, Constipation, COPD (chronic obstructive pulmonary disease) (HCC), Cough, Depressive disorder, not elsewhere classified, Difficulty in walking, Displaced intertrochanteric fracture of left femur (HCC), Esophageal reflux, Extrinsic asthma with exacerbation, Fibromyalgia,  Heart murmur, History of kidney stones, Hyperlipidemia, Hypertension, Muscle weakness (generalized), Nicotine dependence, cigarettes, uncomplicated, Osteoarthrosis, unspecified whether generalized or localized, unspecified site, Other abnormalities of gait and mobility, Other and unspecified hyperlipidemia, Other irritable bowel syndrome, Other screening mammogram, Personal history of unspecified urinary disorder, Pneumonia, Pressure ulcer of sacral region, Routine general medical examination at a health care facility, Routine gynecological examination, Tobacco use disorder, Type II or unspecified type diabetes mellitus without mention of complication, not stated as uncontrolled, Unspecified fall, subsequent encounter, Unspecified sleep apnea, Urinary tract infection, Vaginitis, Wears glasses, and Wheezing.   Surgical History:   Past Surgical History:  Procedure Laterality Date  . ANTERIOR CERVICAL DECOMP/DISCECTOMY FUSION N/A 01/19/2015   Procedure: ANTERIOR CERVICAL DECOMPRESSION/DISCECTOMY FUSION 2 LEVELS;  Surgeon: Phylliss Bob, MD;  Location: [facility];  Service: Orthopedics;  Laterality: N/A;  Anterior cervical decompression fusion, cervical 5-6, cervical 6-7 with instrumentation and allograft  . ANTERIOR CERVICAL DECOMP/DISCECTOMY FUSION N/A 05/23/2017   Procedure: ANTERIOR CERVICAL DECOMPRESSION FUSION, CERVICAL 4-5 WITH INSTRUMENTATION AND ALLOGRAFT;  Surgeon: Phylliss Bob, MD;  Location: [facility];  Service: Orthopedics;  Laterality: N/A;  ANTERIOR CERVICAL DECOMPRESSION FUSION, CERVICAL 4-5 WITH INSTRUMENTATION AND ALLOGRAFT; REQUEST 2 HOURS AND FLIP ROOM  . APPENDECTOMY  1973  . BACK SURGERY     Lumbar- "bone graft"  . CHOLECYSTECTOMY  08/2006  . COLONOSCOPY    . HARDWARE REMOVAL Left 08/20/2018   Procedure: HARDWARE REMOVAL FROM LEFT KNEE;  Surgeon: Milly Jakob, MD;  Location: [facility];  Service: Orthopedics;  Laterality: Left;  . HARDWARE REMOVAL Left 05/29/2019   Procedure: REMOVAL OF   LEFT FEMUR HARDWARE;  Surgeon: Shona Needles, MD;  Location: [facility];  Service: Orthopedics;  Laterality: Left;  . ORIF FEMUR FRACTURE Left 06/29/2018   Procedure: OPEN REDUCTION INTERNAL FIXATION (ORIF) DISTAL FEMUR FRACTURE;  Surgeon: Milly Jakob, MD;  Location: [facility];  Service: Orthopedics;  Laterality: Left;  . ORIF FEMUR FRACTURE Left 05/29/2019   Procedure: REPAIR OF LEFT FEMUR NONUNION;  Surgeon: Shona Needles, MD;  Location: [facility];  Service: Orthopedics;  Laterality: Left;  . TRANSTHORACIC ECHOCARDIOGRAM  02/2011   mild LVH, nl EF, mild diastolic dysfunction,  no wall motion abnl     Social History:   reports that she quit smoking about 2 years ago. Her smoking use included cigarettes. She has a 35.00 pack-year smoking history. She has never used smokeless tobacco. She reports that she does not drink alcohol and does not use drugs.   Family History:  Her family history includes Colon cancer in her cousin; Stroke in her father.   Allergies Allergies  Allergen Reactions  . Benazepril Anaphylaxis, Swelling and Other (See Comments)    Angioedema, throat swelling  . Diclofenac Sodium Other (See Comments)    GI bleed  . Fluticasone-Salmeterol Hives, Itching and Swelling    Tongue was swollen  . Nsaids Other (See Comments)    Rectal bleeding  . Penicillins Hives and Rash    Did it involve swelling of the face/tongue/throat, SOB, or low BP? No Did it involve sudden or severe rash/hives, skin peeling, or any reaction on the inside of your mouth or nose? No Did you need to seek medical attention at a hospital or doctor's office? No When did it last happen?5 - 6 years If all above answers are "NO", may proceed with cephalosporin use.   . Pioglitazone Swelling    Legs and feet  . Aspirin Other (See Comments)    Burns stomach  . Morphine And Related Itching, Rash and Other (See Comments)    Rash and itching with morphine Patient states she is able to tolerate tramadol  without any problems.  . Propoxyphene Other (See Comments)    Darvocet - Headache     Home Medications  Prior to Admission medications   Medication Sig Start Date End Date Taking? Authorizing Provider  ACCU-CHEK SOFTCLIX LANCETS lancets CHECK BLOOD SUGAR TWICE DAILY AND AS DIRECTED 11/08/16  Yes Bedsole, Amy E, MD  acetaminophen (TYLENOL) 650 MG CR tablet Take 1 tablet (650 mg total) by mouth every 8 (eight) hours as needed for pain. 10/12/20  Yes Regalado, Belkys A, MD  acetaminophen-codeine (TYLENOL #3) 300-30 MG tablet Take 1 tablet by mouth daily as needed for moderate pain.   Yes [provider]  ADVAIR DISKUS 500-50 MCG/DOSE AEPB Inhale 1 puff into the lungs 2 (two) times daily. 11/17/20  Yes [provider]  [place] X 5/16" 0.3 ML MISC  04/02/15  Yes [provider]  albuterol (VENTOLIN HFA) 108 (90 Base) MCG/ACT inhaler Inhale 2 puffs into the lungs every 4 (four) hours as needed for wheezing or shortness of breath. 07/31/19  Yes Horton, Barbette Hair, MD  Alcohol Swabs PADS Check blood sugar twice a day and as directed. Dx E11.9 11/03/15  Yes Bedsole, Amy E, MD  ALPRAZolam (XANAX) 0.5 MG tablet Take 1 tablet (0.5 mg total) by mouth 3 (three) times daily as needed for anxiety. 10/12/20  Yes Regalado, Belkys A, MD  amitriptyline (ELAVIL) 75 MG tablet TAKE 1 TABLET(75 MG) BY MOUTH AT BEDTIME Patient taking differently: Take 75 mg by mouth at bedtime. 06/02/20  Yes Bedsole, Amy E, MD  amLODipine (NORVASC) 5 MG tablet Take 1 tablet (5 mg total) by mouth daily. 10/12/20  Yes Regalado, Belkys A, MD  atorvastatin (LIPITOR) 10 MG tablet TAKE 1 TABLET(10 MG) BY MOUTH DAILY Patient taking differently: Take 10 mg by mouth daily. 09/01/20  Yes Bedsole, Amy E, MD  bisacodyl (DULCOLAX) 5 MG EC tablet Take 1 tablet (5 mg total) by mouth daily as needed for moderate constipation. 10/14/20  Yes Pokhrel, Corrie Mckusick, MD  Blood Glucose Monitoring Suppl (ACCU-CHEK AVIVA  PLUS) w/Device KIT Check blood sugar twice daily and as directed. 02/11/20  Yes Bedsole, Amy E, MD  budesonide-formoterol (SYMBICORT) 160-4.5 MCG/ACT inhaler INHALE 2 PUFFS INTO THE LUNGS TWICE DAILY Patient taking differently: Inhale 2 puffs into the lungs in the morning and at bedtime. 08/27/19  Yes Bedsole, Amy E, MD  chlorpheniramine (CHLOR-TRIMETON) 4 MG tablet Take 8 mg by mouth 2 (two) times daily as needed for allergies.   Yes [provider]  furosemide (LASIX) 20 MG tablet TAKE 1 TABLET(20 MG) BY MOUTH DAILY Patient taking differently: Take 40 mg by mouth 2 (two) times daily. 06/02/20  Yes Bedsole, Amy E, MD  gabapentin (NEURONTIN) 300 MG capsule TAKE 1 CAPSULE BY MOUTH IN THE MORNING, 1 CAPSULE IN THE AFTERNOON AND 2 CAPSULES AT BEDTIME Patient taking differently: Take 300 mg by mouth See admin instructions. TAKE 1 CAPSULE BY MOUTH IN THE MORNING, 1 CAPSULE IN THE AFTERNOON AND 2 CAPSULES AT BEDTIME 09/28/20  Yes Bedsole, Amy E, MD  glucose blood (ACCU-CHEK GUIDE) test strip TEST BLOOD SUGAR TWICE DAILY AND AS DIRECTED 05/02/20  Yes Bedsole, Amy E, MD  insulin glargine (LANTUS SOLOSTAR) 100 UNIT/ML Solostar Pen Inject 45 Units into the skin at bedtime. 10/12/20  Yes Regalado, Belkys A, MD  Insulin Pen Needle (B-D ULTRAFINE III SHORT PEN) 31G X 8 MM MISC USE AS DIRECTED WITH INSULIN PEN 08/05/19  Yes Bedsole, Amy E, MD  Lancet Devices (ADJUSTABLE LANCING DEVICE) MISC Check blood sugar twice a day and as directed. Dx E11.9 11/03/15  Yes Bedsole, Amy E, MD  magnesium oxide (MAG-OX) 400 (241.3 Mg) MG tablet Take 0.5 tablets (200 mg total) by mouth 2 (two) times daily. 10/12/20  Yes Regalado, Belkys A, MD  metFORMIN (GLUCOPHAGE) 1000 MG tablet TAKE 1 TABLET(1000 MG) BY MOUTH TWICE DAILY WITH A MEAL Patient taking differently: Take 1,000 mg by mouth 2 (two) times daily with a meal. 08/01/20  Yes Bedsole, Amy E, MD  methocarbamol (ROBAXIN) 750 MG tablet Take 1 tablet (750 mg total) by mouth 3  (three) times daily as needed for muscle spasms. 10/12/20  Yes Regalado, Belkys A, MD  nitrofurantoin, macrocrystal-monohydrate, (MACROBID) 100 MG capsule Take 100 mg by mouth at bedtime.   Yes [provider]  omeprazole (PRILOSEC OTC) 20 MG tablet Take 20 mg by mouth daily.    Yes [provider]  OVER THE COUNTER MEDICATION Apply 1 application topically daily as needed (pain). Hemp oil   Yes [provider]  polyethylene glycol (MIRALAX / GLYCOLAX) 17 g packet Take 17 g by mouth daily as needed for moderate constipation. 10/14/20  Yes Pokhrel, Laxman, MD  Wylene Men, YFGN, 100 UNIT/ML SOPN  11/16/20  Yes [provider]  Vitamin D, Ergocalciferol, (DRISDOL) 1.25 MG (50000 UNIT) CAPS capsule Take 1 capsule (50,000 Units total) by mouth every 7 (seven) days. Patient taking differently: Take 50,000 Units by mouth every Sunday. 07/20/20  Yes Jinny Sanders, MD     Critical care time: 61m    RKara MeadMD. [facility] See Amion for pager  If no response to pager , please call [phone] 7 pm After 7:00 pm call Elink  [phone]  12/01/2020

## 2020-12-01 NOTE — Progress Notes (Signed)
Notified [facility] blood glucose 377, initiating insulin drip.

## 2020-12-01 NOTE — Progress Notes (Signed)
RT NOTE: RT pulled ETT back 3 cm to 22 at the lips per CCM order. VSS. RT will continue to monitor.

## 2020-12-01 NOTE — Progress Notes (Signed)
  Echocardiogram 2D Echocardiogram with definity has been performed.  Darlina Sicilian M 12/01/2020, 11:12 AM

## 2020-12-01 NOTE — Progress Notes (Signed)
SLP Cancellation Note  Patient Details Name: ARCHITA LOMELI MRN: [id number] DOB: [birth year]   Cancelled treatment:       Reason Eval/Treat Not Completed: Patient's level of consciousness. RN reports pt not alert for assessment   Tonye Tancredi, [place] Berkey 12/01/2020, 9:27 AM

## 2020-12-01 NOTE — Progress Notes (Signed)
HOSPITAL MEDICINE OVERNIGHT EVENT NOTE    Notified by nursing that patient began to suddenly exhibit a sudden neurologic change at approximately 11:00 PM.  Code stroke was called.  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  Patient was promptly evaluated by the rapid response team as well as neurology.  Dr. Cheral Marker with neurology felt an acute stroke indeed was possible and therefore recommended patient immediately proceed to noncontrast CT imaging of the head.  I additionally have evaluated the patient at the bedside.  No evidence of any spontaneous movement of the left side although assessment is difficult to perform a neurologic exam considering underlying encephalopathy during this hospitalization.  Patient is not responsive to verbal stimuli.  Patient does not seem to localize to pain.    An ABG was also performed which was not suggestive of hypercapnic respiratory failure with a pH of 7.42 and CO2 35.  Noncontrast CT of the head revealed no evidence of intracranial hemorrhage.  Dr. Cheral Marker has recommended proceeding with administration of TPA followed by transfer to the intensive care unit.  I have additionally discussed the case with CCM who agrees with the plan.  Patient will be transferred to 3M08.   Vernelle Emerald  MD [facility]

## 2020-12-02 ENCOUNTER — Inpatient Hospital Stay (HOSPITAL_COMMUNITY): Payer: Medicare Other

## 2020-12-02 DIAGNOSIS — U071 COVID-19: Secondary | ICD-10-CM | POA: Diagnosis not present

## 2020-12-02 DIAGNOSIS — G9341 Metabolic encephalopathy: Secondary | ICD-10-CM | POA: Diagnosis not present

## 2020-12-02 LAB — POCT I-STAT 7, (LYTES, BLD GAS, ICA,H+H)
Acid-base deficit: 8 mmol/L — ABNORMAL HIGH (ref 0.0–2.0)
Bicarbonate: 21.9 mmol/L (ref 20.0–28.0)
Calcium, Ion: 1.19 mmol/L (ref 1.15–1.40)
HCT: 34 % — ABNORMAL LOW (ref 36.0–46.0)
Hemoglobin: 11.6 g/dL — ABNORMAL LOW (ref 12.0–15.0)
O2 Saturation: 91 %
Potassium: 3.7 mmol/L (ref 3.5–5.1)
Sodium: 147 mmol/L — ABNORMAL HIGH (ref 135–145)
TCO2: 24 mmol/L (ref 22–32)
pCO2 arterial: 66.8 mmHg (ref 32.0–48.0)
pH, Arterial: 7.124 — CL (ref 7.350–7.450)
pO2, Arterial: 81 mmHg — ABNORMAL LOW (ref 83.0–108.0)

## 2020-12-02 MED ORDER — LORAZEPAM 1 MG PO TABS: 1.0000 mg | ORAL_TABLET | ORAL | Status: DC | PRN

## 2020-12-02 MED ORDER — ACETAMINOPHEN 650 MG RE SUPP: 650.0000 mg | Freq: Four times a day (QID) | RECTAL | Status: DC | PRN

## 2020-12-02 MED ORDER — HALOPERIDOL LACTATE 5 MG/ML IJ SOLN: 0.5000 mg | INTRAMUSCULAR | Status: DC | PRN

## 2020-12-02 MED ORDER — LORAZEPAM 2 MG/ML PO CONC: 1.0000 mg | ORAL | Status: DC | PRN

## 2020-12-02 MED ORDER — HALOPERIDOL LACTATE 2 MG/ML PO CONC: 0.5000 mg | ORAL | Status: DC | PRN

## 2020-12-02 MED ORDER — POLYVINYL ALCOHOL 1.4 % OP SOLN: 1.0000 [drp] | Freq: Four times a day (QID) | OPHTHALMIC | Status: DC | PRN

## 2020-12-02 MED ORDER — FENTANYL CITRATE (PF) 100 MCG/2ML IJ SOLN: 25.0000 ug | INTRAMUSCULAR | Status: DC | PRN

## 2020-12-02 MED ORDER — GLYCOPYRROLATE 0.2 MG/ML IJ SOLN: 0.2000 mg | INTRAMUSCULAR | Status: DC | PRN

## 2020-12-02 MED ORDER — BIOTENE DRY MOUTH MT LIQD: 15.0000 mL | OROMUCOSAL | Status: DC | PRN

## 2020-12-02 MED ORDER — LORAZEPAM 2 MG/ML IJ SOLN: 1.0000 mg | INTRAMUSCULAR | Status: DC | PRN

## 2020-12-02 MED ORDER — SODIUM CHLORIDE 0.9 % IV SOLN: 12.5000 mg | Freq: Four times a day (QID) | INTRAVENOUS | Status: DC | PRN

## 2020-12-02 MED ORDER — HALOPERIDOL 0.5 MG PO TABS: 0.5000 mg | ORAL_TABLET | ORAL | Status: DC | PRN

## 2020-12-02 MED ORDER — GLYCOPYRROLATE 1 MG PO TABS: 1.0000 mg | ORAL_TABLET | ORAL | Status: DC | PRN

## 2020-12-02 MED ORDER — GLYCOPYRROLATE 0.2 MG/ML IJ SOLN
0.2000 mg | INTRAMUSCULAR | Status: DC | PRN
  Administered 2020-12-02: 0.2 mg via INTRAVENOUS
  Filled 2020-12-02: qty 1

## 2020-12-02 MED ORDER — ONDANSETRON 4 MG PO TBDP: 4.0000 mg | ORAL_TABLET | Freq: Four times a day (QID) | ORAL | Status: DC | PRN

## 2020-12-02 MED ORDER — ONDANSETRON HCL 4 MG/2ML IJ SOLN: 4.0000 mg | Freq: Four times a day (QID) | INTRAMUSCULAR | Status: DC | PRN

## 2020-12-02 MED ORDER — ACETAMINOPHEN 325 MG PO TABS: 650.0000 mg | ORAL_TABLET | Freq: Four times a day (QID) | ORAL | Status: DC | PRN

## 2020-12-03 LAB — POCT I-STAT 7, (LYTES, BLD GAS, ICA,H+H)
Acid-base deficit: 1 mmol/L (ref 0.0–2.0)
Bicarbonate: 23.2 mmol/L (ref 20.0–28.0)
Calcium, Ion: 1.24 mmol/L (ref 1.15–1.40)
HCT: 36 % (ref 36.0–46.0)
Hemoglobin: 12.2 g/dL (ref 12.0–15.0)
O2 Saturation: 95 %
Patient temperature: 98.6
Potassium: 4 mmol/L (ref 3.5–5.1)
Sodium: 141 mmol/L (ref 135–145)
TCO2: 24 mmol/L (ref 22–32)
pCO2 arterial: 35.1 mmHg (ref 32.0–48.0)
pH, Arterial: 7.428 (ref 7.350–7.450)
pO2, Arterial: 73 mmHg — ABNORMAL LOW (ref 83.0–108.0)

## 2020-12-04 LAB — CULTURE, RESPIRATORY W GRAM STAIN

## 2020-12-04 LAB — CULTURE, BLOOD (ROUTINE X 2)
Culture: NO GROWTH
Culture: NO GROWTH

## 2020-12-06 NOTE — Progress Notes (Signed)
Called E-link and notified of re-check ABG.

## 2020-12-06 NOTE — Death Summary Note (Addendum)
DEATH SUMMARY   Patient Details  Name: Sandra Ferrell MRN: [id number] DOB: [birth year]  Admission/Discharge Information   Admit Date:  2020-12-18  Date of Death: Date of Death: 12/21/2020  Time of Death: Time of Death: 10  Length of Stay: 3  Referring Physician: Jinny Sanders, MD   Reason(s) for Hospitalization  Malignant right MCA stroke Cerebral edema with midline shift Acute hypoxic respiratory failure due to ARDS from COVID-19 pneumonia Diabetes type 2 Acute metabolic encephalopathy Sepsis ruled out  Diagnoses  Preliminary cause of death: Acute hypoxic respiratory failure due to ARDS from COVID-19 pneumonia Secondary Diagnoses (including complications and co-morbidities):  Principal Problem:   COVID-19 virus infection Active Problems:   Hyperlipidemia associated with type 2 diabetes mellitus (HCC)   GAD (generalized anxiety disorder)   Acute respiratory failure with hypoxia (HCC)   SOB (shortness of breath)   Acute metabolic encephalopathy   COPD (chronic obstructive pulmonary disease) (HCC)   Pressure injury of skin   Brief Hospital Course (including significant findings, care, treatment, and services provided and events leading to death)  Sandra Ferrell is a 60 y.o. year old female who was admitted 12-18-22 with progressive shortness of breath over 5 to 6 days and fevers 102.  She required nonrebreather on admission and chest x-ray showed bilateral patchy airspace disease .  She was treated with remdesivir steroids and developed hyperglycemia requiring insulin drip. On 1/26 she developed right-sided gaze preference with flaccid left arm, NIH stroke scale 28, CT showed no hemorrhage concern for right MCA territory infarct with CT angiogram to history of LVO right ICA terminus occlusion, TPA was given on 1/27 and 00 20.  Felt not to be a candidate for IR.  Transferred from the floor to 3M08.  Insulin drip started 1/27 PCCM consulted this morning for respiratory  distress. Patient continued to remain hypoxic, repeat head CT showed evolving malignant right MCA stroke with cerebral edema and midline shift.  She required multiple doses of neuromuscular blockade and deep sedation despite that her oxygen saturation did not improve.  Patient's family was contacted and after explaining her condition they decided to pursue comfort care and requested palliative extubation.  Patient was declared dead on 12-21-20 at 1:32 PM    Pertinent Labs and Studies  Significant Diagnostic Studies CT Code Stroke CTA Head W/WO contrast  Result Date: 12/01/2020 CLINICAL DATA:  Initial evaluation for acute stroke. EXAM: CT ANGIOGRAPHY HEAD AND NECK TECHNIQUE: Multidetector CT imaging of the head and neck was performed using the standard protocol during bolus administration of intravenous contrast. Multiplanar CT image reconstructions and MIPs were obtained to evaluate the vascular anatomy. Carotid stenosis measurements (when applicable) are obtained utilizing NASCET criteria, using the distal internal carotid diameter as the denominator. CONTRAST:  64mL OMNIPAQUE IOHEXOL 350 MG/ML SOLN COMPARISON:  Prior head CT from earlier the same day. FINDINGS: CTA NECK FINDINGS Aortic arch: Visualized aortic arch of normal caliber with normal 3 vessel morphology. Mild-to-moderate atheromatous change about the arch and origin of the great vessels without hemodynamically significant stenosis. Right carotid system: Right CCA patent from its origin to the bifurcation without stenosis. Bulky calcified plaque about the right bifurcation/proximal right ICA with associated stenosis of up to 30% by NASCET criteria. There is a focal hypodense filling defect within the proximal right ICA at this level, consistent with thrombus (series 8, image 121). Findings suspected to be related to an ulcerated and/or ruptured atheromatous plaque. This is likely the embolic source. Right ICA patent  distally to the skull base  without stenosis, dissection or occlusion. Left carotid system: Left CCA patent from its origin to the bifurcation without stenosis. Mild atheromatous plaque about the left bifurcation without significant stenosis. Left ICA patent distally without stenosis, dissection or occlusion. Vertebral arteries: Both vertebral arteries arise from subclavian arteries. No proximal subclavian artery stenosis. Vertebral arteries patent within the neck without stenosis, dissection or occlusion. Skeleton: No visible acute osseous abnormality. No discrete or worrisome osseous lesions. Prior fusion noted at C4-C7. Patient is largely edentulous. Other neck: No other acute soft tissue abnormality within the neck. No mass or adenopathy. Upper chest: Extensive multifocal ground-glass opacity seen within the partially visualized lungs, concerning for infection/pneumonia. Review of the MIP images confirms the above findings CTA HEAD FINDINGS Anterior circulation: Petrous segments patent bilaterally. Atheromatous plaque within the carotid siphons with associated mild to moderate narrowing on the right and relatively mild narrowing on the left. There is abrupt occlusion at the right ICA terminus, likely embolic (series 7, image 451). Absent flow within the right M1 segment and distal right MCA branches, with relatively little if any collateralization within the right MCA distribution. A1 segments patent bilaterally. 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 Anterior cerebral arteries patent to their distal aspects without stenosis. Left M1 patent. Normal left MCA bifurcation. Distal left MCA branches well perfused. Posterior circulation: Both V4 segments patent to the vertebrobasilar junction without stenosis. Right vertebral dominant. Both PICA origins patent and normal. Basilar patent to its distal aspect without stenosis. Superior cerebellar arteries patent bilaterally. Both PCAs primarily supplied via  the basilar. PCAs remain well perfused to their distal aspects. Venous sinuses: Grossly patent allowing for timing the contrast bolus. Anatomic variants: None significant. Review of the MIP images confirms the above findings IMPRESSION: 1. 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 Absent flow within the right M1 segment and distal right MCA branches, with relatively little if any collateralization within the right MCA distribution. 2. Bulky calcified plaque about the right bifurcation/proximal right ICA with associated stenosis of up to 30% by NASCET criteria. Superimposed focal filling defect at the proximal right ICA consistent with thrombus, likely due to an ulcerated and/or ruptured plaque. This is almost certainly the embolic source. 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 4. Extensive multifocal ground-glass opacity within the partially visualized lungs, consistent with history of COVID pneumonia. Critical Value/emergent results were called by telephone at the time of interpretation on 12/01/2020 at 12:24 am to provider ERIC [facility] , who verbally acknowledged these results. Electronically Signed   By: Jeannine Boga M.D.   On: 12/01/2020 01:28   CT HEAD WO CONTRAST  Result Date: 12/01/2020 CLINICAL DATA:  60 year old female with right MCA territory stroke. Follow-up exam. EXAM: CT HEAD WITHOUT CONTRAST TECHNIQUE: Contiguous axial images were obtained from the base of the skull through the vertex without intravenous contrast. COMPARISON:  Head CT dated 11/30/2020. FINDINGS: Brain: There has been interval development of a large area of low attenuation with loss of gray-white matter discrimination and effacement of the sulci involving the right frontal, temporal, parietal and occipital lobes in keeping with evolution of known right MCA territory infarct and edema. There is associated mass effect and compression of the right lateral ventricle. No acute intracranial  hemorrhage. No midline shift. Vascular: Slightly higher attenuation of the right MCA. Skull: Normal. Negative for fracture or focal lesion. Sinuses/Orbits: Mucoperiosteal thickening of paranasal sinuses primarily involving the  maxillary sinuses. The mastoid air cells are clear. Other: None IMPRESSION: Interval evolution of a large right MCA territory infarct with associated mass effect and compression of the right lateral ventricle. No acute intracranial hemorrhage. No midline shift. Continued follow-up recommended. Electronically Signed   By: Anner Crete M.D.   On: 12/01/2020 21:40   CT HEAD WO CONTRAST  Result Date: 11/30/2020 CLINICAL DATA:  Mental status changes EXAM: CT HEAD WITHOUT CONTRAST TECHNIQUE: Contiguous axial images were obtained from the base of the skull through the vertex without intravenous contrast. COMPARISON:  12/31/2019 FINDINGS: Brain: Minor white matter microvascular ischemic changes about lateral ventricles throughout both cerebral hemispheres. No acute intracranial hemorrhage infarction, midline shift hydrocephalus, or extra-axial collection. No focal mass effect. Cisterns are patent. No cerebellar abnormality. Vascular: No hyperdense vessel or unexpected calcification. Skull: Normal. Negative for fracture or focal lesion. Sinuses/Orbits: Orbits are unremarkable. Chronic maxillary sinus disease bilaterally. Other: None. IMPRESSION: Minor chronic white matter microvascular changes. No acute intracranial abnormality by noncontrast CT. Electronically Signed   By: Jerilynn Mages.  Shick M.D.   On: 11/30/2020 13:48   CT Code Stroke CTA Neck W/WO contrast  Result Date: 12/01/2020 CLINICAL DATA:  Initial evaluation for acute stroke. EXAM: CT ANGIOGRAPHY HEAD AND NECK TECHNIQUE: Multidetector CT imaging of the head and neck was performed using the standard protocol during bolus administration of intravenous contrast. Multiplanar CT image reconstructions and MIPs were obtained to evaluate the  vascular anatomy. Carotid stenosis measurements (when applicable) are obtained utilizing NASCET criteria, using the distal internal carotid diameter as the denominator. CONTRAST:  18mL OMNIPAQUE IOHEXOL 350 MG/ML SOLN COMPARISON:  Prior head CT from earlier the same day. FINDINGS: CTA NECK FINDINGS Aortic arch: Visualized aortic arch of normal caliber with normal 3 vessel morphology. Mild-to-moderate atheromatous change about the arch and origin of the great vessels without hemodynamically significant stenosis. Right carotid system: Right CCA patent from its origin to the bifurcation without stenosis. Bulky calcified plaque about the right bifurcation/proximal right ICA with associated stenosis of up to 30% by NASCET criteria. There is a focal hypodense filling defect within the proximal right ICA at this level, consistent with thrombus (series 8, image 121). Findings suspected to be related to an ulcerated and/or ruptured atheromatous plaque. This is likely the embolic source. Right ICA patent distally to the skull base without stenosis, dissection or occlusion. Left carotid system: Left CCA patent from its origin to the bifurcation without stenosis. Mild atheromatous plaque about the left bifurcation without significant stenosis. Left ICA patent distally without stenosis, dissection or occlusion. Vertebral arteries: Both vertebral arteries arise from subclavian arteries. No proximal subclavian artery stenosis. Vertebral arteries patent within the neck without stenosis, dissection or occlusion. Skeleton: No visible acute osseous abnormality. No discrete or worrisome osseous lesions. Prior fusion noted at C4-C7. Patient is largely edentulous. Other neck: No other acute soft tissue abnormality within the neck. No mass or adenopathy. Upper chest: Extensive multifocal ground-glass opacity seen within the partially visualized lungs, concerning for infection/pneumonia. Review of the MIP images confirms the above findings  CTA HEAD FINDINGS Anterior circulation: Petrous segments patent bilaterally. Atheromatous plaque within the carotid siphons with associated mild to moderate narrowing on the right and relatively mild narrowing on the left. There is abrupt occlusion at the right ICA terminus, likely embolic (series 7, image 451). Absent flow within the right M1 segment and distal right MCA branches, with relatively little if any collateralization within the right MCA distribution. A1 segments patent bilaterally. 4 mm aneurysm  seen at the anterior communicating artery (series 9, image 149). Anterior cerebral arteries patent to their distal aspects without stenosis. Left M1 patent. Normal left MCA bifurcation. Distal left MCA branches well perfused. Posterior circulation: Both V4 segments patent to the vertebrobasilar junction without stenosis. Right vertebral dominant. Both PICA origins patent and normal. Basilar patent to its distal aspect without stenosis. Superior cerebellar arteries patent bilaterally. Both PCAs primarily supplied via the basilar. PCAs remain well perfused to their distal aspects. Venous sinuses: Grossly patent allowing for timing the contrast bolus. Anatomic variants: None significant. Review of the MIP images confirms the above findings IMPRESSION: 1. 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 Absent flow within the right M1 segment and distal right MCA branches, with relatively little if any collateralization within the right MCA distribution. 2. Bulky calcified plaque about the right bifurcation/proximal right ICA with associated stenosis of up to 30% by NASCET criteria. Superimposed focal filling defect at the proximal right ICA consistent with thrombus, likely due to an ulcerated and/or ruptured plaque. This is almost certainly the embolic source. 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 4. Extensive multifocal ground-glass opacity within the partially visualized lungs, consistent  with history of COVID pneumonia. Critical Value/emergent results were called by telephone at the time of interpretation on 12/01/2020 at 12:24 am to provider ERIC [facility] , who verbally acknowledged these results. Electronically Signed   By: Jeannine Boga M.D.   On: 12/01/2020 01:28   DG Chest Port 1 View  Result Date: 12/01/2020 CLINICAL DATA:  Acute respiratory failure EXAM: PORTABLE CHEST 1 VIEW COMPARISON:  12/01/2020 FINDINGS: Single frontal view of the chest demonstrates interval retraction of the endotracheal tube, now approximately 3.5 cm above carina. Enteric catheter passes below diaphragm, tip excluded by collimation but side port projecting over the gastric fundus. Cardiac silhouette is stable. Bibasilar airspace disease slightly more pronounced than previous. No effusion or pneumothorax. No acute bony abnormalities. IMPRESSION: 1. Support devices as above, with endotracheal tube now well above carina. 2. Progressive bibasilar airspace disease. Electronically Signed   By: Randa Ngo M.D.   On: 12/01/2020 22:08   Portable Chest x-ray  Result Date: 12/01/2020 CLINICAL DATA:  Hypoxia EXAM: PORTABLE CHEST 1 VIEW COMPARISON:  December 01, 2020 study obtained earlier in the day FINDINGS: Endotracheal tube tip is in the proximal right main bronchus. There is a nasogastric tube with tip in the proximal stomach. Side port is slightly superior to the gastroesophageal junction. No pneumothorax. There is atelectatic change in the left base with ill-defined opacity in the medial left base concerning for superimposed pneumonia. Heart is borderline enlarged with pulmonary vascularity normal. No adenopathy. There is postoperative change in the lower cervical region. IMPRESSION: 1. Endotracheal tube tip is in the proximal right main bronchus. Advise withdrawing endotracheal tube 4-4.5 cm. 2. Nasogastric tube side port is above the gastroesophageal junction. Advise advancing nasogastric tube 4-5 cm. 3.  Bibasilar atelectasis with suspected superimposed pneumonia left base. 4.  Stable cardiac prominence. Critical Value/emergent results were called by telephone at the time of interpretation on 12/01/2020 at 1:42 pm to provider [facility] , who verbally acknowledged these results. Electronically Signed   By: Lowella Grip III M.D.   On: 12/01/2020 13:42   DG CHEST PORT 1 VIEW  Result Date: 12/01/2020 CLINICAL DATA:  Follow-up shortness of breath.  COVID. EXAM: PORTABLE CHEST 1 VIEW COMPARISON:  Chest x-ray 11/16/2020. FINDINGS: Heart size stable. Prominent multifocal bilateral pulmonary infiltrates are again  noted. Similar findings noted on prior exam. Small left pleural effusion. No pneumothorax. Prior cervical spine fusion. IMPRESSION: Prominent multifocal bilateral pulmonary infiltrates again noted. Similar findings noted on prior exam. Electronically Signed   By: [place]   On: 12/01/2020 10:55   DG Chest Port 1 View  Result Date: 11/14/2020 CLINICAL DATA:  Shortness of breath EXAM: PORTABLE CHEST 1 VIEW COMPARISON:  07/31/2019 FINDINGS: Cardiac shadow is within normal limits. Patchy airspace opacity is noted bilaterally consistent with multifocal pneumonia. Correlate with COVID testing. No effusion is seen. Postsurgical changes in the cervical spine are noted. IMPRESSION: Increased bilateral airspace opacities consistent with multifocal pneumonia. Correlate with COVID-19 testing. Electronically Signed   By: Inez Catalina M.D.   On: 11/26/2020 12:47   DG Abd Portable 1V  Result Date: 11/30/2020 CLINICAL DATA:  Abdominal pain, history of COVID-19 positivity EXAM: PORTABLE ABDOMEN - 1 VIEW COMPARISON:  10/08/2020 FINDINGS: Postsurgical changes are noted in the left femur and lumbar spine. Mild degenerative changes of lumbar spine are seen. Scattered large and small bowel gas is noted. No free air is seen. Changes of prior cholecystectomy are noted. IMPRESSION: No acute abnormality noted.  Electronically Signed   By: Inez Catalina M.D.   On: 11/30/2020 09:57   ECHOCARDIOGRAM COMPLETE  Result Date: 12/01/2020    ECHOCARDIOGRAM REPORT   Patient Name:   Sandra Ferrell Date of Exam: 12/01/2020 Medical Rec #:  [id number]        Height:       58.0 in Accession #:    [id number]       Weight:       212.5 lb Date of Birth:  April 21, 1961         BSA:          1.869 m Patient Age:    60 years         BP:           161/100 mmHg Patient Gender: F                HR:           129 bpm. Exam Location:  Inpatient Procedure: 2D Echo, Cardiac Doppler, Color Doppler and Intracardiac            Opacification Agent Indications:    Stroke 434.91 / I163.9  History:        Patient has prior history of Echocardiogram examinations, most                 recent 03/01/2011. CHF, COPD; Risk Factors:Hypertension,                 Diabetes, Dyslipidemia, Sleep Apnea and Current Smoker. Anemia.                 COVID-19.  Sonographer:    Darlina Sicilian RDCS Referring Phys: [id number] [facility]  Sonographer Comments: Technically difficult study due to poor echo windows. IMPRESSIONS  1. Left ventricular ejection fraction, by estimation, is 55%. The left ventricle has normal function. Left ventricular endocardial border not optimally defined to evaluate regional wall motion. Indeterminate diastolic filling due to E-A fusion.  2. Right ventricular systolic function is normal. The right ventricular size is normal. There is moderately elevated pulmonary artery systolic pressure. The estimated right ventricular systolic pressure is 23.5 mmHg.  3. The mitral valve is normal in structure. No evidence of mitral valve regurgitation. No evidence of mitral stenosis.  4. Tricuspid valve regurgitation is moderate.  5. The aortic valve is tricuspid. Aortic valve regurgitation is not visualized. Mild aortic valve sclerosis is present, with no evidence of aortic valve stenosis.  6. The inferior vena cava is normal in size with greater than 50% respiratory  variability, suggesting right atrial pressure of 3 mmHg. FINDINGS  Left Ventricle: Left ventricular ejection fraction, by estimation, is 55%. The left ventricle has normal function. Left ventricular endocardial border not optimally defined to evaluate regional wall motion. The left ventricular internal cavity size was normal in size. There is no left ventricular hypertrophy. Indeterminate diastolic filling due to E-A fusion. Right Ventricle: The right ventricular size is normal. No increase in right ventricular wall thickness. Right ventricular systolic function is normal. There is moderately elevated pulmonary artery systolic pressure. The tricuspid regurgitant velocity is 3.26 m/s, and with an assumed right atrial pressure of 3 mmHg, the estimated right ventricular systolic pressure is [id number] mmHg. Left Atrium: Left atrial size was normal in size. Right Atrium: Right atrial size was normal in size. Pericardium: There is no evidence of pericardial effusion. Mitral Valve: The mitral valve is normal in structure. No evidence of mitral valve regurgitation. No evidence of mitral valve stenosis. Tricuspid Valve: The tricuspid valve is normal in structure. Tricuspid valve regurgitation is moderate. Aortic Valve: The aortic valve is tricuspid. Aortic valve regurgitation is not visualized. Mild aortic valve sclerosis is present, with no evidence of aortic valve stenosis. Pulmonic Valve: The pulmonic valve was normal in structure. Pulmonic valve regurgitation is not visualized. Aorta: The aortic root is normal in size and structure. Venous: The inferior vena cava is normal in size with greater than 50% respiratory variability, suggesting right atrial pressure of 3 mmHg. IAS/Shunts: No atrial level shunt detected by color flow Doppler.  LEFT VENTRICLE PLAX 2D LVIDd:         4.50 cm LVIDs:         3.50 cm LV PW:         1.00 cm LV IVS:        0.80 cm LVOT diam:     1.90 cm LV SV:         43 LV SV Index:   23 LVOT Area:     2.84  cm  LV Volumes (MOD) LV vol d, MOD A2C: 46.9 ml LV vol d, MOD A4C: 67.9 ml LV vol s, MOD A2C: 21.6 ml LV vol s, MOD A4C: 17.8 ml LV SV MOD A2C:     25.3 ml LV SV MOD A4C:     67.9 ml LV SV MOD BP:      35.1 ml RIGHT VENTRICLE RV S prime:     9.79 cm/s LEFT ATRIUM             Index LA diam:        3.60 cm 1.93 cm/m LA Vol (A2C):   26.2 ml 14.02 ml/m LA Vol (A4C):   36.5 ml 19.53 ml/m LA Biplane Vol: 32.2 ml 17.23 ml/m  AORTIC VALVE LVOT Vmax:   96.90 cm/s LVOT Vmean:  64.800 cm/s LVOT VTI:    0.152 m  AORTA Ao Root diam: 2.90 cm Ao Asc diam:  3.20 cm TRICUSPID VALVE TR Peak grad:   42.5 mmHg TR Vmax:        326.00 cm/s  SHUNTS Systemic VTI:  0.15 m Systemic Diam: 1.90 cm Loralie Champagne MD Electronically signed by Loralie Champagne MD Signature Date/Time: 12/01/2020/6:39:48 PM    Final    CT HEAD CODE  STROKE WO CONTRAST  Result Date: 12/01/2020 CLINICAL DATA:  Code stroke. Initial evaluation for acute aphasia, left-sided weakness and lethargy. EXAM: CT HEAD WITHOUT CONTRAST TECHNIQUE: Contiguous axial images were obtained from the base of the skull through the vertex without intravenous contrast. COMPARISON:  Prior head CT from 11/30/2020 FINDINGS: Brain: Examination technically limited by motion artifact. No definite intracranial hemorrhage. There is subtle fogging with loss of cortical sulcation involving a large portion of the right MCA distribution, with involvement of the right insula and adjacent right temporal occipital region, as well as the overlying supra ganglionic cerebral gray matter. Relative sparing of the right basal ganglia and internal capsule. No other acute large vessel territory infarct. No mass lesion, midline shift, or mass effect. No hydrocephalus. No definite extra-axial fluid collection. Vascular: Question focal hyperdensity at the right ICA terminus, which could reflect thrombus (series 2, image 16). Skull: Scalp soft tissues within normal limits. Calvarium grossly intact. Sinuses/Orbits:  Globes and orbital soft tissues grossly within normal limits. Right gaze noted. Moderate mucosal thickening noted within the right maxillary sinus, chronic in appearance. Mastoid air cells are clear. Other: None. ASPECTS [facility] Stroke Program Early CT Score) - Ganglionic level infarction (caudate, lentiform nuclei, internal capsule, insula, M1-M3 cortex): 4 - Supraganglionic infarction (M4-M6 cortex): 1 Total score (0-10 with 10 being normal): 5 IMPRESSION: 1. Subtle loss of gray-white matter differentiation and cortical sulcation involving the right MCA distribution as above, concerning for evolving acute right MCA territory infarct. No acute intracranial hemorrhage. 2. ASPECTS is 5. Critical Value/emergent results were called by telephone at the time of interpretation on 12/01/2020 at 12:06 am to provider Dr. Cheral Marker, who verbally acknowledged these results. Electronically Signed   By: Jeannine Boga M.D.   On: 12/01/2020 00:25   VAS [place] LOWER EXTREMITY VENOUS (DVT)  Result Date: 12/01/2020  Lower Venous DVT Study Indications: Stroke.  Comparison Study: no prior Performing Technologist: Abram Sander RVS  Examination Guidelines: A complete evaluation includes B-mode imaging, spectral Doppler, color Doppler, and power Doppler as needed of all accessible portions of each vessel. Bilateral testing is considered an integral part of a complete examination. Limited examinations for reoccurring indications may be performed as noted. The reflux portion of the exam is performed with the patient in reverse Trendelenburg.  +---------+---------------+---------+-----------+----------+-------------------+ RIGHT    CompressibilityPhasicitySpontaneityPropertiesThrombus Aging      +---------+---------------+---------+-----------+----------+-------------------+ CFV      Full           Yes      Yes                                       +---------+---------------+---------+-----------+----------+-------------------+ SFJ      Full                                                             +---------+---------------+---------+-----------+----------+-------------------+ FV Prox  Full                                                             +---------+---------------+---------+-----------+----------+-------------------+  FV Mid   Full                                                             +---------+---------------+---------+-----------+----------+-------------------+ FV DistalFull                                                             +---------+---------------+---------+-----------+----------+-------------------+ PFV      Full                                                             +---------+---------------+---------+-----------+----------+-------------------+ POP      Full           Yes      Yes                                      +---------+---------------+---------+-----------+----------+-------------------+ PTV      Full                                                             +---------+---------------+---------+-----------+----------+-------------------+ PERO                                                  Not well visualized +---------+---------------+---------+-----------+----------+-------------------+   +---------+---------------+---------+-----------+----------+-------------------+ LEFT     CompressibilityPhasicitySpontaneityPropertiesThrombus Aging      +---------+---------------+---------+-----------+----------+-------------------+ CFV      Full           Yes      Yes                                      +---------+---------------+---------+-----------+----------+-------------------+ SFJ      Full                                                             +---------+---------------+---------+-----------+----------+-------------------+ FV  Prox  Full                                                             +---------+---------------+---------+-----------+----------+-------------------+ FV Mid  Yes      Yes                                      +---------+---------------+---------+-----------+----------+-------------------+ FV Distal               Yes      Yes                                      +---------+---------------+---------+-----------+----------+-------------------+ PFV      Full                                                             +---------+---------------+---------+-----------+----------+-------------------+ POP      Full           Yes      Yes                                      +---------+---------------+---------+-----------+----------+-------------------+ PTV      Full                                                             +---------+---------------+---------+-----------+----------+-------------------+ PERO                                                  Not well visualized +---------+---------------+---------+-----------+----------+-------------------+     Summary: BILATERAL: - No evidence of deep vein thrombosis seen in the lower extremities, bilaterally. - No evidence of superficial venous thrombosis in the lower extremities, bilaterally. -No evidence of popliteal cyst, bilaterally.   *See table(s) above for measurements and observations. Electronically signed by Servando Snare MD on 12/01/2020 at 4:43:34 PM.    Final     Microbiology Recent Results (from the past 240 hour(s))  SARS Coronavirus 2 by RT PCR (hospital order, performed in [facility] hospital lab) Nasopharyngeal Nasopharyngeal Swab     Status: Abnormal   Collection Time: 12/03/2020 12:23 PM   Specimen: Nasopharyngeal Swab  Result Value Ref Range Status   SARS Coronavirus 2 POSITIVE (A) NEGATIVE Final    Comment: emailed L. Berdik RN 14:20 11/15/2020 ([email or web address]) (NOTE) SARS-CoV-2 target  nucleic acids are DETECTED  SARS-CoV-2 RNA is generally detectable in upper respiratory specimens  during the acute phase of infection.  Positive results are indicative  of the presence of the identified virus, but do not rule out bacterial infection or co-infection with other pathogens not detected by the test.  Clinical correlation with patient history and  other diagnostic information is necessary to determine patient infection status.  The expected result is negative.  Fact Sheet for Patients:   StrictlyIdeas.no   Fact Sheet for Healthcare Providers:   BankingDealers.co.za    This test is not yet approved or  cleared by the [place] and  has been authorized for detection and/or diagnosis of SARS-CoV-2 by FDA under an Emergency Use Authorization (EUA).  This EUA will remain in effect (meaning this test can be used) for the duration of  the  COVID-19 declaration under Section 564(b)(1) of the Act, 21 U.S.C. section 360-bbb-3(b)(1), unless the authorization is terminated or revoked sooner.  Performed at [facility], [place] [address], [place], [place] [address]   Blood Culture (routine x 2)     Status: None (Preliminary result)   Collection Time: 11/26/2020 12:23 PM   Specimen: BLOOD  Result Value Ref Range Status   Specimen Description BLOOD RIGHT ANTECUBITAL  Final   Special Requests   Final    BOTTLES DRAWN AEROBIC AND ANAEROBIC Blood Culture results may not be optimal due to an inadequate volume of blood received in culture bottles   Culture   Final    NO GROWTH 3 DAYS Performed at [facility], [place] [address] [place], [place] [address]    Report Status PENDING  Incomplete  Blood Culture (routine x 2)     Status: None (Preliminary result)   Collection Time: 11/28/2020 12:28 PM   Specimen: BLOOD LEFT HAND  Result Value Ref Range Status   Specimen Description BLOOD LEFT HAND  Final   Special Requests   Final     BOTTLES DRAWN AEROBIC AND ANAEROBIC Blood Culture results may not be optimal due to an inadequate volume of blood received in culture bottles   Culture   Final    NO GROWTH 3 DAYS Performed at [facility], [place] [address] [place], [place] [address]    Report Status PENDING  Incomplete  MRSA PCR Screening     Status: None   Collection Time: 11/30/20  2:45 PM   Specimen: Nasal Mucosa; Nasopharyngeal  Result Value Ref Range Status   MRSA by PCR NEGATIVE NEGATIVE Final    Comment:        The GeneXpert MRSA Assay (FDA approved for NASAL specimens only), is one component of a comprehensive MRSA colonization surveillance program. It is not intended to diagnose MRSA infection nor to guide or monitor treatment for MRSA infections. Performed at [facility], [place] [address] [place], [place] [address]   Culture, respiratory (non-expectorated)     Status: None (Preliminary result)   Collection Time: 12/01/20  2:34 PM   Specimen: Tracheal Aspirate; Respiratory  Result Value Ref Range Status   Specimen Description TRACHEAL ASPIRATE  Final   Special Requests NONE  Final   Gram Stain   Final    ABUNDANT WBC PRESENT, PREDOMINANTLY PMN ABUNDANT GRAM POSITIVE COCCI    Culture   Final    CULTURE REINCUBATED FOR BETTER GROWTH Performed at [facility], [place] [address] [place],  [address]    Report Status PENDING  Incomplete    Lab Basic Metabolic Panel: Recent Labs  Lab 11/11/2020 1250 11/12/2020 1301 11/30/20 0342 11/30/20 0415 11/30/20 0751 12/01/20 0212 12/01/20 1627 12/01/20 2307 12/01/20 2309 09-Dec-2020 0011  NA 140 142 141   < > 140 140 145 145 146* 147*  K 3.8 3.8 4.0   < > 3.7 4.2 3.6 3.6  --  3.7  CL 107 109 108  --  107 103  --   --   --   --   CO2 17*  --  17*  --  20* 22  --   --   --   --  GLUCOSE 304* 296* 352*  --  307* 382*  --   --   --   --   BUN 9 9 14   --  13 17  --   --   --   --   CREATININE 0.75 0.50 0.72  --  0.62 0.60  --   --    --   --   CALCIUM 8.3*  --  8.6*  --  8.5* 8.9  --   --   --   --   MG 1.5*  --  1.5*  --   --  1.8  --   --   --   --   PHOS  --   --  2.7  --   --   --   --   --   --   --    < > = values in this interval not displayed.   Liver Function Tests: Recent Labs  Lab 11/10/2020 1250 11/30/20 0342 12/01/20 0212  AST 28 27 23   ALT 21 23 21   ALKPHOS 102 99 102  BILITOT 1.1 1.1 1.0  PROT 6.9 6.9 7.4  ALBUMIN 2.1* 2.2* 2.2*   Recent Labs  Lab 11/30/20 0751  LIPASE 19   No results for input(s): AMMONIA in the last 168 hours. CBC: Recent Labs  Lab 11/28/2020 1250 11/20/2020 1301 11/30/20 0342 11/30/20 0415 12/01/20 0212 12/01/20 1627 12/01/20 2307 12/20/20 0011  WBC 7.4  --  6.3  --  7.2  --   --   --   NEUTROABS 6.0  --  5.0  --   --   --   --   --   HGB 12.0   < > 12.8 11.2* 12.8 12.2 12.2 11.6*  HCT 37.9   < > 37.2 33.0* 39.4 36.0 36.0 34.0*  MCV 84.4  --  82.5  --  81.4  --   --   --   PLT 434*  --  442*  --  474*  --   --   --    < > = values in this interval not displayed.   Cardiac Enzymes: No results for input(s): CKTOTAL, CKMB, CKMBINDEX, TROPONINI in the last 168 hours. Sepsis Labs: Recent Labs  Lab 11/18/2020 1250 11/30/20 0342 11/30/20 1535 12/01/20 0212  PROCALCITON <0.10  --   --   --   WBC 7.4 6.3  --  7.2  LATICACIDVEN 0.9  --  1.3  --     Procedures/Operations  Endotracheal intubation Arterial line   Sudham Chand 2020/12/20, 2:13 PM

## 2020-12-06 NOTE — Progress Notes (Signed)
[facility] Progress Note Patient Name: Sandra Ferrell DOB: 1960/11/18 MRN: [id number]   Date of Service  12/01/2020  HPI/Events of Note  Sandra Ferrell and his two children have decided to make Sandra Ferrell DNR with compassionate extubation, and full comfort measures.  eICU Interventions  DNR, terminal extubation, full comfort measures per family wishes.[name] 12/03/2020, 2:04 AM

## 2020-12-06 NOTE — Procedures (Signed)
Extubation Procedure Note  Patient Details:   Name: Sandra Ferrell DOB: 09-Aug-1961 MRN: [id number]   Airway Documentation:    Vent end date: 05-Dec-2020 Vent end time: 0305    Pt was made comfort care.  Pt extubated per MD order.   Clance Boll 12-05-2020, 3:11 AM

## 2020-12-06 NOTE — Progress Notes (Signed)
[facility] Progress Note Patient Name: Sandra Ferrell DOB: [birth year] MRN: [id number]   Date of Service  11/19/2020  HPI/Events of Note  PH remains at 7.12 despite recent ventilator changes.  eICU Interventions  Tidal volume increased to 400 ml (slightly above guidelines but necessary to address persistent hypercapnia and respiratory acidosis in a patient with cerebral edema), and PEEP reduced to 15 to try to increase minute ventilation.        [name] [name] 11/27/2020, 12:26 AM

## 2020-12-06 NOTE — Progress Notes (Signed)
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 He would like to visit her in the ICU as soon as he is discharged. All questions answered.   Electronically signed: Dr. Kerney Elbe

## 2020-12-06 NOTE — Progress Notes (Addendum)
NAME:  Sandra Ferrell, MRN:  [id number], DOB:  07-25-1961, LOS: 3 ADMISSION DATE:  11/08/2020, CONSULTATION DATE:  2020-12-27  REFERRING MD:  Posey Pronto, [facility], CHIEF COMPLAINT:  resp distress   Brief History:  60 year old unvaccinated diabetic with COPD, admitted with severe Covid pneumonia 1/95 Course complicated by right hemiplegia 1/27 early a.m., given TPA PCCM consulted for respiratory distress requiring emergent intubation  Past Medical History:  COPD Diabetes type 2, Hypertension Chronic diastolic heart failure Generalized anxiety disorder Ex-Smoker, quit 2019, 35 pack years  Significant Hospital Events:  1/27 early a.m. code stroke  Consults:  Neurology  Procedures:  ETT 1/27 >>  Significant Diagnostic Tests:  Head  CT angio 1/27 right MCA territory infarct with the right ICA terminus occlusion CT head 1/27: Interval evolution of a large right MCA territory infarct with associated mass effect and compression of the right lateral ventricle. No acute intracranial hemorrhage. No midline shift  Interim History / Subjective:  Patient continued to remain hypoxic, requiring endotracheal intubation.  Post intubation remain hypoxic and dyssynchronous with the ventilator, requiring escalating doses of sedation and neuromuscular blocker. Last night patient had change in exam with unequal pupillary size, stat head CT was done which showed extensive right MCA evolving infarct with cerebral edema and midline shift and compression of lateral ventricle  Objective   Blood pressure 101/65, pulse (!) 136, temperature (!) 100.58 F (38.1 C), resp. rate (!) 28, height $RemoveBe'4\' 10"'vPWfUsypE$  (1.473 m), weight 96.4 kg, SpO2 (!) 70 %.    Vent Mode: PRVC FiO2 (%):  [100 %] 100 % Set Rate:  [28 bmp-35 bmp] 35 bmp Vt Set:  [330 mL-400 mL] 400 mL PEEP:  [14 cmH20-16 cmH20] 15 cmH20   Intake/Output Summary (Last 24 hours) at 2020/12/27 0901 Last data filed at 27-Dec-2020 0800 Gross per 24 hour  Intake 2259.2  ml  Output 115 ml  Net 2144.2 ml   Filed Weights   11/30/20 0723 12/01/20 0056 12/01/20 0406  Weight: 111 kg 96.4 kg 96.4 kg    Examination: General: Middle-aged woman unresponsive, supine , looks comfortable HENT: Atraumatic, dry mucous membranes Lungs: Bilateral air entry, faint scattered rhonchi, accessory muscle use Cardiovascular: S1-S2 tacky Abdomen: Soft nontender Extremities: No edema, no rash Neuro: Remain completely unresponsive   Resolved Hospital Problem list     Assessment & Plan:  Acute hypoxic respiratory failure due to ARDS from COVID-19 pneumonia Extensive acute RT MCA CVA Cerebral edema with midline shift Diabetes type 2 COPD Acute metabolic encephalopathy  Patient was intubated yesterday and was placed on mechanical ventilation, at night patient started with dyssynchrony with ventilator, became hypoxic and hypotensive, requiring escalating doses of sedation and neuromuscular blockade. Also she became hypotensive requiring IV vasopressors with phenylephrine Overnight patient exam changed with unequal pupil and sluggish reaction, she had a stat CT head which showed extensive evolving acute right MCA stroke with cerebral edema and midline shift and compressing lateral ventricle Neurosurgery was consulted, recommend no acute intervention Family was contacted, patient's son and her husband came at bedside, after explaining her condition and poor prognosis, patient's family opted to start comfort care and requested palliative extubation, which was performed overnight. At this time patient is comfortable on as needed fentanyl and Versed She remained in airborne/contact isolation  Best practice (evaluated daily)  Diet: NPO Pain/Anxiety/Delirium protocol (if indicated): Versed and fentanyl VAP protocol (if indicated):  DVT prophylaxis: CMO GI prophylaxis: CMO Glucose control: Mobility: Bedrest  Goals of Care:  Last date of multidisciplinary  goals of care  discussion: 1/28 Family and staff present: Patient's son, her husband and RN Summary of discussion: Proceed with comfort care, with palliative extubation Code Status: DNR/DNI, comfort care only  Labs   CBC: Recent Labs  Lab 11/21/2020 1250 11/12/2020 1301 11/30/20 0342 11/30/20 0415 12/01/20 0212 12/01/20 1627 12/01/20 2307 12-18-20 0011  WBC 7.4  --  6.3  --  7.2  --   --   --   NEUTROABS 6.0  --  5.0  --   --   --   --   --   HGB 12.0   < > 12.8 11.2* 12.8 12.2 12.2 11.6*  HCT 37.9   < > 37.2 33.0* 39.4 36.0 36.0 34.0*  MCV 84.4  --  82.5  --  81.4  --   --   --   PLT 434*  --  442*  --  474*  --   --   --    < > = values in this interval not displayed.    Basic Metabolic Panel: Recent Labs  Lab 11/08/2020 1250 11/14/2020 1301 11/30/20 0342 11/30/20 0415 11/30/20 0751 12/01/20 0212 12/01/20 1627 12/01/20 2307 12/01/20 2309 Dec 18, 2020 0011  NA 140 142 141   < > 140 140 145 145 146* 147*  K 3.8 3.8 4.0   < > 3.7 4.2 3.6 3.6  --  3.7  CL 107 109 108  --  107 103  --   --   --   --   CO2 17*  --  17*  --  20* 22  --   --   --   --   GLUCOSE 304* 296* 352*  --  307* 382*  --   --   --   --   BUN 9 9 14   --  13 17  --   --   --   --   CREATININE 0.75 0.50 0.72  --  0.62 0.60  --   --   --   --   CALCIUM 8.3*  --  8.6*  --  8.5* 8.9  --   --   --   --   MG 1.5*  --  1.5*  --   --  1.8  --   --   --   --   PHOS  --   --  2.7  --   --   --   --   --   --   --    < > = values in this interval not displayed.   GFR: Estimated Creatinine Clearance: 74.5 mL/min (by C-G formula based on SCr of 0.6 mg/dL). Recent Labs  Lab 11/17/2020 1250 11/30/20 0342 11/30/20 1535 12/01/20 0212  PROCALCITON <0.10  --   --   --   WBC 7.4 6.3  --  7.2  LATICACIDVEN 0.9  --  1.3  --     Liver Function Tests: Recent Labs  Lab 11/09/2020 1250 11/30/20 0342 12/01/20 0212  AST 28 27 23   ALT 21 23 21   ALKPHOS 102 99 102  BILITOT 1.1 1.1 1.0  PROT 6.9 6.9 7.4  ALBUMIN 2.1* 2.2* 2.2*   Recent  Labs  Lab 11/30/20 0751  LIPASE 19   No results for input(s): AMMONIA in the last 168 hours.  ABG    Component Value Date/Time   PHART 7.124 (LL) 12/18/20 0011   PCO2ART 66.8 (HH) 12/18/20 0011   PO2ART 81 (L) 12-18-20 0011  HCO3 21.9 12-28-2020 0011   TCO2 24 December 28, 2020 0011   ACIDBASEDEF 8.0 (H) 12-28-20 0011   O2SAT 91.0 December 28, 2020 0011     Coagulation Profile: No results for input(s): INR, PROTIME in the last 168 hours.  Cardiac Enzymes: No results for input(s): CKTOTAL, CKMB, CKMBINDEX, TROPONINI in the last 168 hours.  HbA1C: Hgb A1c MFr Bld  Date/Time Value Ref Range Status  10/07/2020 03:56 PM 8.1 (H) 4.8 - 5.6 % Final    Comment:    (NOTE) Pre diabetes:          5.7%-6.4%  Diabetes:              >6.4%  Glycemic control for   <7.0% adults with diabetes   07/01/2020 05:11 PM 10.9 (H) <5.7 % of total Hgb Final    Comment:    For someone without known diabetes, a hemoglobin A1c value of 6.5% or greater indicates that they may have  diabetes and this should be confirmed with a follow-up  test. . For someone with known diabetes, a value <7% indicates  that their diabetes is well controlled and a value  greater than or equal to 7% indicates suboptimal  control. A1c targets should be individualized based on  duration of diabetes, age, comorbid conditions, and  other considerations. . Currently, no consensus exists regarding use of hemoglobin A1c for diagnosis of diabetes for children. .     CBG: Recent Labs  Lab 12/01/20 1403 12/01/20 1654 12/01/20 1829 12/01/20 2042 12/01/20 2317  GLUCAP 183* 284* 301* 281* 285*    Past Medical History:  She,  has a past medical history of Allergic rhinitis, cause unspecified, Anemia, Anxiety state, unspecified, Backache, unspecified, Carpal tunnel syndrome, right, CHF (congestive heart failure) (HCC), Chronic pain syndrome, Constipation, COPD (chronic obstructive pulmonary disease) (HCC), Cough,  Depressive disorder, not elsewhere classified, Difficulty in walking, Displaced intertrochanteric fracture of left femur ([place]), Esophageal reflux, Extrinsic asthma with exacerbation, Fibromyalgia, Heart murmur, History of kidney stones, Hyperlipidemia, Hypertension, Muscle weakness (generalized), Nicotine dependence, cigarettes, uncomplicated, Osteoarthrosis, unspecified whether generalized or localized, unspecified site, Other abnormalities of gait and mobility, Other and unspecified hyperlipidemia, Other irritable bowel syndrome, Other screening mammogram, Personal history of unspecified urinary disorder, Pneumonia, Pressure ulcer of sacral region, Routine general medical examination at a health care facility, Routine gynecological examination, Tobacco use disorder, Type II or unspecified type diabetes mellitus without mention of complication, not stated as uncontrolled, Unspecified fall, subsequent encounter, Unspecified sleep apnea, Urinary tract infection, Vaginitis, Wears glasses, and Wheezing.   Surgical History:   Past Surgical History:  Procedure Laterality Date  . ANTERIOR CERVICAL DECOMP/DISCECTOMY FUSION N/A 01/19/2015   Procedure: ANTERIOR CERVICAL DECOMPRESSION/DISCECTOMY FUSION 2 LEVELS;  Surgeon: Phylliss Bob, MD;  Location: [place];  Service: Orthopedics;  Laterality: N/A;  Anterior cervical decompression fusion, cervical 5-6, cervical 6-7 with instrumentation and allograft  . ANTERIOR CERVICAL DECOMP/DISCECTOMY FUSION N/A 05/23/2017   Procedure: ANTERIOR CERVICAL DECOMPRESSION FUSION, CERVICAL 4-5 WITH INSTRUMENTATION AND ALLOGRAFT;  Surgeon: Phylliss Bob, MD;  Location: [place];  Service: Orthopedics;  Laterality: N/A;  ANTERIOR CERVICAL DECOMPRESSION FUSION, CERVICAL 4-5 WITH INSTRUMENTATION AND ALLOGRAFT; REQUEST 2 HOURS AND FLIP ROOM  . APPENDECTOMY  1973  . BACK SURGERY     Lumbar- "bone graft"  . CHOLECYSTECTOMY  08/2006  . COLONOSCOPY    . HARDWARE REMOVAL Left 08/20/2018    Procedure: HARDWARE REMOVAL FROM LEFT KNEE;  Surgeon: Milly Jakob, MD;  Location: [facility];  Service: Orthopedics;  Laterality: Left;  .  HARDWARE REMOVAL Left 05/29/2019   Procedure: REMOVAL OF  LEFT FEMUR HARDWARE;  Surgeon: Shona Needles, MD;  Location: [place];  Service: Orthopedics;  Laterality: Left;  . ORIF FEMUR FRACTURE Left 06/29/2018   Procedure: OPEN REDUCTION INTERNAL FIXATION (ORIF) DISTAL FEMUR FRACTURE;  Surgeon: Milly Jakob, MD;  Location: [place];  Service: Orthopedics;  Laterality: Left;  . ORIF FEMUR FRACTURE Left 05/29/2019   Procedure: REPAIR OF LEFT FEMUR NONUNION;  Surgeon: Shona Needles, MD;  Location: [place];  Service: Orthopedics;  Laterality: Left;  . TRANSTHORACIC ECHOCARDIOGRAM  02/2011   mild LVH, nl EF, mild diastolic dysfunction, no wall motion abnl     Social History:   reports that she quit smoking about 2 years ago. Her smoking use included cigarettes. She has a 35.00 pack-year smoking history. She has never used smokeless tobacco. She reports that she does not drink alcohol and does not use drugs.   Family History:  Her family history includes Colon cancer in her cousin; Stroke in her father.   Allergies Allergies  Allergen Reactions  . Benazepril Anaphylaxis, Swelling and Other (See Comments)    Angioedema, throat swelling  . Diclofenac Sodium Other (See Comments)    GI bleed  . Fluticasone-Salmeterol Hives, Itching and Swelling    Tongue was swollen  . Nsaids Other (See Comments)    Rectal bleeding  . Penicillins Hives and Rash    Did it involve swelling of the face/tongue/throat, SOB, or low BP? No Did it involve sudden or severe rash/hives, skin peeling, or any reaction on the inside of your mouth or nose? No Did you need to seek medical attention at a hospital or doctor's office? No When did it last happen?5 - 6 years If all above answers are "NO", may proceed with cephalosporin use.   . Pioglitazone Swelling    Legs and feet  .  Aspirin Other (See Comments)    Burns stomach  . Morphine And Related Itching, Rash and Other (See Comments)    Rash and itching with morphine Patient states she is able to tolerate tramadol without any problems.  . Propoxyphene Other (See Comments)    Darvocet - Headache     Home Medications  Prior to Admission medications   Medication Sig Start Date End Date Taking? Authorizing Provider  ACCU-CHEK SOFTCLIX LANCETS lancets CHECK BLOOD SUGAR TWICE DAILY AND AS DIRECTED 11/08/16  Yes Bedsole, Amy E, MD  acetaminophen (TYLENOL) 650 MG CR tablet Take 1 tablet (650 mg total) by mouth every 8 (eight) hours as needed for pain. 10/12/20  Yes Regalado, Belkys A, MD  acetaminophen-codeine (TYLENOL #3) 300-30 MG tablet Take 1 tablet by mouth daily as needed for moderate pain.   Yes [provider]  ADVAIR DISKUS 500-50 MCG/DOSE AEPB Inhale 1 puff into the lungs 2 (two) times daily. 11/17/20  Yes [provider]  [place] X 5/16" 0.3 ML MISC  04/02/15  Yes [provider]  albuterol (VENTOLIN HFA) 108 (90 Base) MCG/ACT inhaler Inhale 2 puffs into the lungs every 4 (four) hours as needed for wheezing or shortness of breath. 07/31/19  Yes Horton, Barbette Hair, MD  Alcohol Swabs PADS Check blood sugar twice a day and as directed. Dx E11.9 11/03/15  Yes Bedsole, Amy E, MD  ALPRAZolam (XANAX) 0.5 MG tablet Take 1 tablet (0.5 mg total) by mouth 3 (three) times daily as needed for anxiety. 10/12/20  Yes Regalado, Cassie Freer, MD  amitriptyline (  ELAVIL) 75 MG tablet TAKE 1 TABLET(75 MG) BY MOUTH AT BEDTIME Patient taking differently: Take 75 mg by mouth at bedtime. 06/02/20  Yes Bedsole, Amy E, MD  amLODipine (NORVASC) 5 MG tablet Take 1 tablet (5 mg total) by mouth daily. 10/12/20  Yes Regalado, Belkys A, MD  atorvastatin (LIPITOR) 10 MG tablet TAKE 1 TABLET(10 MG) BY MOUTH DAILY Patient taking differently: Take 10 mg by mouth daily. 09/01/20  Yes Bedsole, Amy E, MD   bisacodyl (DULCOLAX) 5 MG EC tablet Take 1 tablet (5 mg total) by mouth daily as needed for moderate constipation. 10/14/20  Yes Pokhrel, Laxman, MD  Blood Glucose Monitoring Suppl (ACCU-CHEK AVIVA PLUS) w/Device KIT Check blood sugar twice daily and as directed. 02/11/20  Yes Bedsole, Amy E, MD  budesonide-formoterol (SYMBICORT) 160-4.5 MCG/ACT inhaler INHALE 2 PUFFS INTO THE LUNGS TWICE DAILY Patient taking differently: Inhale 2 puffs into the lungs in the morning and at bedtime. 08/27/19  Yes Bedsole, Amy E, MD  chlorpheniramine (CHLOR-TRIMETON) 4 MG tablet Take 8 mg by mouth 2 (two) times daily as needed for allergies.   Yes [provider]  furosemide (LASIX) 20 MG tablet TAKE 1 TABLET(20 MG) BY MOUTH DAILY Patient taking differently: Take 40 mg by mouth 2 (two) times daily. 06/02/20  Yes Bedsole, Amy E, MD  gabapentin (NEURONTIN) 300 MG capsule TAKE 1 CAPSULE BY MOUTH IN THE MORNING, 1 CAPSULE IN THE AFTERNOON AND 2 CAPSULES AT BEDTIME Patient taking differently: Take 300 mg by mouth See admin instructions. TAKE 1 CAPSULE BY MOUTH IN THE MORNING, 1 CAPSULE IN THE AFTERNOON AND 2 CAPSULES AT BEDTIME 09/28/20  Yes Bedsole, Amy E, MD  glucose blood (ACCU-CHEK GUIDE) test strip TEST BLOOD SUGAR TWICE DAILY AND AS DIRECTED 05/02/20  Yes Bedsole, Amy E, MD  insulin glargine (LANTUS SOLOSTAR) 100 UNIT/ML Solostar Pen Inject 45 Units into the skin at bedtime. 10/12/20  Yes Regalado, Belkys A, MD  Insulin Pen Needle (B-D ULTRAFINE III SHORT PEN) 31G X 8 MM MISC USE AS DIRECTED WITH INSULIN PEN 08/05/19  Yes Bedsole, Amy E, MD  Lancet Devices (ADJUSTABLE LANCING DEVICE) MISC Check blood sugar twice a day and as directed. Dx E11.9 11/03/15  Yes Bedsole, Amy E, MD  magnesium oxide (MAG-OX) 400 (241.3 Mg) MG tablet Take 0.5 tablets (200 mg total) by mouth 2 (two) times daily. 10/12/20  Yes Regalado, Belkys A, MD  metFORMIN (GLUCOPHAGE) 1000 MG tablet TAKE 1 TABLET(1000 MG) BY MOUTH TWICE DAILY WITH A  MEAL Patient taking differently: Take 1,000 mg by mouth 2 (two) times daily with a meal. 08/01/20  Yes Bedsole, Amy E, MD  methocarbamol (ROBAXIN) 750 MG tablet Take 1 tablet (750 mg total) by mouth 3 (three) times daily as needed for muscle spasms. 10/12/20  Yes Regalado, Belkys A, MD  nitrofurantoin, macrocrystal-monohydrate, (MACROBID) 100 MG capsule Take 100 mg by mouth at bedtime.   Yes [provider]  omeprazole (PRILOSEC OTC) 20 MG tablet Take 20 mg by mouth daily.    Yes [provider]  OVER THE COUNTER MEDICATION Apply 1 application topically daily as needed (pain). Hemp oil   Yes [provider]  polyethylene glycol (MIRALAX / GLYCOLAX) 17 g packet Take 17 g by mouth daily as needed for moderate constipation. 10/14/20  Yes Pokhrel, Laxman, MD  Wylene Men, YFGN, 100 UNIT/ML SOPN  11/16/20  Yes [provider]  Vitamin D, Ergocalciferol, (DRISDOL) 1.25 MG (50000 UNIT) CAPS capsule Take 1 capsule (50,000 Units total) by  mouth every 7 (seven) days. Patient taking differently: Take 50,000 Units by mouth every Sunday. 07/20/20  Yes Jinny Sanders, MD      Jacky Kindle MD [facility] See Amion for pager If no response to pager, please call [phone] until 7pm After 7pm, Please call E-link [phone]

## 2020-12-06 NOTE — Consult Note (Signed)
Reason for Consult:cerebral infarct, dilated pupil Referring Physician: [name] is an 60 y.o. female.  HPI: admitted on 1/25 due to pneumonia secondary to Covid-19 infection. Mental status was altered upon admission. Code stroke activated on 11/30/2020 2316 . Noted by neurology consultant to have a left facial droop, left hemiplegia, and aphasia. CTA revealed right ica occlusion, right mca occlusion.Sandra Ferrell received TPA. 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 I was called regarding possible hemicraniectomy. Past Medical History:  Diagnosis Date  . Allergic rhinitis, cause unspecified   . Anemia   . Anxiety state, unspecified   . Backache, unspecified   . Carpal tunnel syndrome, right    Bilateral  . CHF (congestive heart failure) ([place])    unspecified , patient denies  . Chronic pain syndrome   . Constipation   . COPD (chronic obstructive pulmonary disease) ([place])   . Cough   . Depressive disorder, not elsewhere classified    managed with medications  . Difficulty in walking   . Displaced intertrochanteric fracture of left femur ([place])   . Esophageal reflux   . Extrinsic asthma with exacerbation   . Fibromyalgia   . Heart murmur    "a small one"  . History of kidney stones   . Hyperlipidemia   . Hypertension   . Muscle weakness (generalized)   . Nicotine dependence, cigarettes, uncomplicated   . Osteoarthrosis, unspecified whether generalized or localized, unspecified site   . Other abnormalities of gait and mobility   . Other and unspecified hyperlipidemia   . Other irritable bowel syndrome   . Other screening mammogram   . Personal history of unspecified urinary disorder   . Pneumonia   . Pressure ulcer of sacral region   . Routine general medical examination at a health care facility   . Routine gynecological examination   . Tobacco use disorder   . Type II or unspecified type diabetes mellitus without mention of  complication, not stated as uncontrolled    Type II  . Unspecified fall, subsequent encounter   . Unspecified sleep apnea   . Urinary tract infection   . Vaginitis   . Wears glasses   . Wheezing     Past Surgical History:  Procedure Laterality Date  . ANTERIOR CERVICAL DECOMP/DISCECTOMY FUSION N/A 01/19/2015   Procedure: ANTERIOR CERVICAL DECOMPRESSION/DISCECTOMY FUSION 2 LEVELS;  Surgeon: Phylliss Bob, MD;  Location: [facility];  Service: Orthopedics;  Laterality: N/A;  Anterior cervical decompression fusion, cervical 5-6, cervical 6-7 with instrumentation and allograft  . ANTERIOR CERVICAL DECOMP/DISCECTOMY FUSION N/A 05/23/2017   Procedure: ANTERIOR CERVICAL DECOMPRESSION FUSION, CERVICAL 4-5 WITH INSTRUMENTATION AND ALLOGRAFT;  Surgeon: Phylliss Bob, MD;  Location: [facility];  Service: Orthopedics;  Laterality: N/A;  ANTERIOR CERVICAL DECOMPRESSION FUSION, CERVICAL 4-5 WITH INSTRUMENTATION AND ALLOGRAFT; REQUEST 2 HOURS AND FLIP ROOM  . APPENDECTOMY  1973  . BACK SURGERY     Lumbar- "bone graft"  . CHOLECYSTECTOMY  08/2006  . COLONOSCOPY    . HARDWARE REMOVAL Left 08/20/2018   Procedure: HARDWARE REMOVAL FROM LEFT KNEE;  Surgeon: Milly Jakob, MD;  Location: [facility];  Service: Orthopedics;  Laterality: Left;  . HARDWARE REMOVAL Left 05/29/2019   Procedure: REMOVAL OF  LEFT FEMUR HARDWARE;  Surgeon: Shona Needles, MD;  Location: [facility];  Service: Orthopedics;  Laterality: Left;  . ORIF FEMUR FRACTURE Left 06/29/2018   Procedure: OPEN REDUCTION INTERNAL FIXATION (ORIF) DISTAL FEMUR FRACTURE;  Surgeon: [place] Silos,  Shanon Brow, MD;  Location: [facility];  Service: Orthopedics;  Laterality: Left;  . ORIF FEMUR FRACTURE Left 05/29/2019   Procedure: REPAIR OF LEFT FEMUR NONUNION;  Surgeon: Shona Needles, MD;  Location: [facility];  Service: Orthopedics;  Laterality: Left;  . TRANSTHORACIC ECHOCARDIOGRAM  02/2011   mild LVH, nl EF, mild diastolic dysfunction, no wall motion abnl    Family History  Problem  Relation Age of Onset  . Stroke Father   . Colon cancer Cousin     Social History:  reports that she quit smoking about 2 years ago. Her smoking use included cigarettes. She has a 35.00 pack-year smoking history. She has never used smokeless tobacco. She reports that she does not drink alcohol and does not use drugs.  Allergies:  Allergies  Allergen Reactions  . Benazepril Anaphylaxis, Swelling and Other (See Comments)    Angioedema, throat swelling  . Diclofenac Sodium Other (See Comments)    GI bleed  . Fluticasone-Salmeterol Hives, Itching and Swelling    Tongue was swollen  . Nsaids Other (See Comments)    Rectal bleeding  . Penicillins Hives and Rash    Did it involve swelling of the face/tongue/throat, SOB, or low BP? No Did it involve sudden or severe rash/hives, skin peeling, or any reaction on the inside of your mouth or nose? No Did you need to seek medical attention at a hospital or doctor's office? No When did it last happen?5 - 6 years If all above answers are "NO", may proceed with cephalosporin use.   . Pioglitazone Swelling    Legs and feet  . Aspirin Other (See Comments)    Burns stomach  . Morphine And Related Itching, Rash and Other (See Comments)    Rash and itching with morphine Patient states she is able to tolerate tramadol without any problems.  . Propoxyphene Other (See Comments)    Darvocet - Headache    Medications: I have reviewed the patient's current medications.  Results for orders placed or performed during the hospital encounter of 12/03/2020 (from the past 48 hour(s))  C-reactive protein     Status: Abnormal   Collection Time: 11/30/20  3:42 AM  Result Value Ref Range   CRP 21.9 (H) <1.0 mg/dL    Comment: Performed at [facility], [address], [place], [place] [address]  Ferritin     Status: Abnormal   Collection Time: 11/30/20  3:42 AM  Result Value Ref Range   Ferritin 988 (H) 11 - 307 ng/mL    Comment: Performed at  [facility], [place] [address] [place], [place] [address]  Fibrinogen     Status: Abnormal   Collection Time: 11/30/20  3:42 AM  Result Value Ref Range   Fibrinogen 795 (H) 210 - 475 mg/dL    Comment: Performed at [facility] [address], [place], [place] [address]  Lactate dehydrogenase     Status: Abnormal   Collection Time: 11/30/20  3:42 AM  Result Value Ref Range   LDH 473 (H) 98 - 192 U/L    Comment: Performed at [facility], [place] [address] [place], [place] [address]  Magnesium     Status: Abnormal   Collection Time: 11/30/20  3:42 AM  Result Value Ref Range   Magnesium 1.5 (L) 1.7 - 2.4 mg/dL    Comment: Performed at [facility] [address] [place], [place] [address]  Phosphorus     Status:  None   Collection Time: 11/30/20  3:42 AM  Result Value Ref Range   Phosphorus 2.7 2.5 - 4.6 mg/dL    Comment: Performed at [facility], [address], [place], [place] [address]  Comprehensive metabolic panel     Status: Abnormal   Collection Time: 11/30/20  3:42 AM  Result Value Ref Range   Sodium 141 135 - 145 mmol/L   Potassium 4.0 3.5 - 5.1 mmol/L   Chloride 108 98 - 111 mmol/L   CO2 17 (L) 22 - 32 mmol/L   Glucose, Bld 352 (H) 70 - 99 mg/dL    Comment: Glucose reference range applies only to samples taken after fasting for at least 8 hours.   BUN 14 6 - 20 mg/dL   Creatinine, Ser 7.48 0.44 - 1.00 mg/dL   Calcium 8.6 (L) 8.9 - 10.3 mg/dL   Total Protein 6.9 6.5 - 8.1 g/dL   Albumin 2.2 (L) 3.5 - 5.0 g/dL   AST 27 15 - 41 U/L   ALT 23 0 - 44 U/L   Alkaline Phosphatase 99 38 - 126 U/L   Total Bilirubin 1.1 0.3 - 1.2 mg/dL   GFR, Estimated >27 >07 mL/min    Comment: (NOTE) Calculated using the CKD-EPI Creatinine Equation (2021)    Anion gap 16 (H) 5 - 15    Comment: Performed at [facility], [address], [place], [place] [address]  CBC with Differential/Platelet     Status: Abnormal   Collection Time: 11/30/20  3:42 AM   Result Value Ref Range   WBC 6.3 4.0 - 10.5 K/uL   RBC 4.51 3.87 - 5.11 MIL/uL   Hemoglobin 12.8 12.0 - 15.0 g/dL   HCT 49.2 01.0 - 07.1 %   MCV 82.5 80.0 - 100.0 fL   MCH 28.4 26.0 - 34.0 pg   MCHC 34.4 30.0 - 36.0 g/dL   RDW 21.9 75.8 - 83.2 %   Platelets 442 (H) 150 - 400 K/uL   nRBC 0.0 0.0 - 0.2 %   Neutrophils Relative % 80 %   Neutro Abs 5.0 1.7 - 7.7 K/uL   Lymphocytes Relative 16 %   Lymphs Abs 1.0 0.7 - 4.0 K/uL   Monocytes Relative 4 %   Monocytes Absolute 0.3 0.1 - 1.0 K/uL   Eosinophils Relative 0 %   Eosinophils Absolute 0.0 0.0 - 0.5 K/uL   Basophils Relative 0 %   Basophils Absolute 0.0 0.0 - 0.1 K/uL   nRBC 0 0 /100 WBC   Abs Immature Granulocytes 0.00 0.00 - 0.07 K/uL    Comment: Performed at [facility], [address], [place], [place] [address]  TSH     Status: Abnormal   Collection Time: 11/30/20  3:42 AM  Result Value Ref Range   TSH 0.278 (L) 0.350 - 4.500 uIU/mL    Comment: Performed by a 3rd Generation assay with a functional sensitivity of <=0.01 uIU/mL. Performed at [facility], [address], [place], [place] [address]   I-Stat arterial blood gas, ED     Status: Abnormal   Collection Time: 11/30/20  4:15 AM  Result Value Ref Range   pH, Arterial 7.442 7.350 - 7.450   pCO2 arterial 26.3 (L) 32.0 - 48.0 mmHg   pO2, Arterial 83 83.0 - 108.0 mmHg   Bicarbonate 18.0 (L) 20.0 - 28.0 mmol/L   TCO2 19 (L) 22 - 32 mmol/L   O2 Saturation 97.0 %  Acid-base deficit 5.0 (H) 0.0 - 2.0 mmol/L   Sodium 141 135 - 145 mmol/L   Potassium 3.6 3.5 - 5.1 mmol/L   Calcium, Ion 1.21 1.15 - 1.40 mmol/L   HCT 33.0 (L) 36.0 - 46.0 %   Hemoglobin 11.2 (L) 12.0 - 15.0 g/dL   Patient temperature 98.3 F    Sample type ARTERIAL   CBG monitoring, ED     Status: Abnormal   Collection Time: 11/30/20  7:18 AM  Result Value Ref Range   Glucose-Capillary 281 (H) 70 - 99 mg/dL    Comment: Glucose reference range applies only to samples taken after fasting  for at least 8 hours.  Basic metabolic panel     Status: Abnormal   Collection Time: 11/30/20  7:51 AM  Result Value Ref Range   Sodium 140 135 - 145 mmol/L   Potassium 3.7 3.5 - 5.1 mmol/L   Chloride 107 98 - 111 mmol/L   CO2 20 (L) 22 - 32 mmol/L   Glucose, Bld 307 (H) 70 - 99 mg/dL    Comment: Glucose reference range applies only to samples taken after fasting for at least 8 hours.   BUN 13 6 - 20 mg/dL   Creatinine, Ser 0.62 0.44 - 1.00 mg/dL   Calcium 8.5 (L) 8.9 - 10.3 mg/dL   GFR, Estimated >60 >60 mL/min    Comment: (NOTE) Calculated using the CKD-EPI Creatinine Equation (2021)    Anion gap 13 5 - 15    Comment: Performed at [facility] [address], [place], [place] [address]  Lipase, blood     Status: None   Collection Time: 11/30/20  7:51 AM  Result Value Ref Range   Lipase 19 11 - 51 U/L    Comment: Performed at [facility] [address], [place], [place] [address]  CBG monitoring, ED     Status: Abnormal   Collection Time: 11/30/20  1:07 PM  Result Value Ref Range   Glucose-Capillary 313 (H) 70 - 99 mg/dL    Comment: Glucose reference range applies only to samples taken after fasting for at least 8 hours.  MRSA PCR Screening     Status: None   Collection Time: 11/30/20  2:45 PM   Specimen: Nasal Mucosa; Nasopharyngeal  Result Value Ref Range   MRSA by PCR NEGATIVE NEGATIVE    Comment:        The GeneXpert MRSA Assay (FDA approved for NASAL specimens only), is one component of a comprehensive MRSA colonization surveillance program. It is not intended to diagnose MRSA infection nor to guide or monitor treatment for MRSA infections. Performed at [facility], [place] [address], [place], [place] [address]   Lactic acid, plasma     Status: None   Collection Time: 11/30/20  3:35 PM  Result Value Ref Range   Lactic Acid, Venous 1.3 0.5 - 1.9 mmol/L    Comment: Performed at [facility] [address] [place], [place] [address]   Glucose, capillary     Status: Abnormal   Collection Time: 11/30/20  4:27 PM  Result Value Ref Range   Glucose-Capillary 304 (H) 70 - 99 mg/dL    Comment: Glucose reference range applies only to samples taken after fasting for at least 8 hours.   Comment 1 Notify RN    Comment 2 Document in Chart   Glucose, capillary     Status: Abnormal   Collection Time: 11/30/20  8:19  PM  Result Value Ref Range   Glucose-Capillary 307 (H) 70 - 99 mg/dL    Comment: Glucose reference range applies only to samples taken after fasting for at least 8 hours.  Glucose, capillary     Status: Abnormal   Collection Time: 11/30/20 10:31 PM  Result Value Ref Range   Glucose-Capillary 290 (H) 70 - 99 mg/dL    Comment: Glucose reference range applies only to samples taken after fasting for at least 8 hours.  Blood gas, arterial     Status: Abnormal   Collection Time: 11/30/20 11:45 PM  Result Value Ref Range   FIO2 21.00    pH, Arterial 7.423 7.350 - 7.450   pCO2 arterial 34.8 32.0 - 48.0 mmHg   pO2, Arterial 72.2 (L) 83.0 - 108.0 mmHg   Bicarbonate 22.3 20.0 - 28.0 mmol/L   Acid-base deficit 1.5 0.0 - 2.0 mmol/L   O2 Saturation 94.1 %   Patient temperature 37.0    Collection site LEFT BRACHIAL    Drawn by COLLECTED BY RT     Comment: LILLIAN H   Sample type ARTERIAL    Allens test (pass/fail) BRACHIAL ARTERY (A) PASS    Comment: Performed at [facility], [place] [address] [place], [place] [address]  Glucose, capillary     Status: Abnormal   Collection Time: 12/01/20 12:53 AM  Result Value Ref Range   Glucose-Capillary 344 (H) 70 - 99 mg/dL    Comment: Glucose reference range applies only to samples taken after fasting for at least 8 hours.  CBC     Status: Abnormal   Collection Time: 12/01/20  2:12 AM  Result Value Ref Range   WBC 7.2 4.0 - 10.5 K/uL   RBC 4.84 3.87 - 5.11 MIL/uL   Hemoglobin 12.8 12.0 - 15.0 g/dL   HCT 39.4 36.0 - 46.0 %   MCV 81.4 80.0 - 100.0 fL   MCH 26.4 26.0 - 34.0  pg   MCHC 32.5 30.0 - 36.0 g/dL   RDW 13.5 11.5 - 15.5 %   Platelets 474 (H) 150 - 400 K/uL   nRBC 0.0 0.0 - 0.2 %    Comment: Performed at [facility] [address] [place], [place] [address]  C-reactive protein     Status: Abnormal   Collection Time: 12/01/20  2:12 AM  Result Value Ref Range   CRP 15.9 (H) <1.0 mg/dL    Comment: Performed at [facility] [address], [place], [place] [address]  D-dimer, quantitative (not at [facility])     Status: Abnormal   Collection Time: 12/01/20  2:12 AM  Result Value Ref Range   D-Dimer, Quant >20.00 (H) 0.00 - 0.50 ug/mL-FEU    Comment: (NOTE) At the manufacturer cut-off value of 0.5 g/mL FEU, this assay has a negative predictive value of 95-100%.This assay is intended for use in conjunction with a clinical pretest probability (PTP) assessment model to exclude pulmonary embolism (PE) and deep venous thrombosis (DVT) in outpatients suspected of PE or DVT. Results should be correlated with clinical presentation. Performed at [facility], [place] [address] [place], [place] [address]   Comprehensive metabolic panel     Status: Abnormal   Collection Time: 12/01/20  2:12 AM  Result Value Ref Range   Sodium 140 135 - 145 mmol/L   Potassium 4.2 3.5 - 5.1 mmol/L   Chloride 103 98 - 111 mmol/L   CO2 22 22 - 32 mmol/L  Glucose, Bld 382 (H) 70 - 99 mg/dL    Comment: Glucose reference range applies only to samples taken after fasting for at least 8 hours.   BUN 17 6 - 20 mg/dL   Creatinine, Ser 0.960.60 0.44 - 1.00 mg/dL   Calcium 8.9 8.9 - 04.510.3 mg/dL   Total Protein 7.4 6.5 - 8.1 g/dL   Albumin 2.2 (L) 3.5 - 5.0 g/dL   AST 23 15 - 41 U/L   ALT 21 0 - 44 U/L   Alkaline Phosphatase 102 38 - 126 U/L   Total Bilirubin 1.0 0.3 - 1.2 mg/dL   GFR, Estimated >40>60 >98>60 mL/min    Comment: (NOTE) Calculated using the CKD-EPI Creatinine Equation (2021)    Anion gap 15 5 - 15    Comment: Performed at [facility], [address] [place], [place] [address]  Magnesium     Status: None   Collection Time: 12/01/20  2:12 AM  Result Value Ref Range   Magnesium 1.8 1.7 - 2.4 mg/dL    Comment: Performed at [facility], [address] [place], [place] [address]  Glucose, capillary     Status: Abnormal   Collection Time: 12/01/20  3:27 AM  Result Value Ref Range   Glucose-Capillary 377 (H) 70 - 99 mg/dL    Comment: Glucose reference range applies only to samples taken after fasting for at least 8 hours.  Glucose, capillary     Status: Abnormal   Collection Time: 12/01/20  4:30 AM  Result Value Ref Range   Glucose-Capillary 350 (H) 70 - 99 mg/dL    Comment: Glucose reference range applies only to samples taken after fasting for at least 8 hours.  Glucose, capillary     Status: Abnormal   Collection Time: 12/01/20  5:38 AM  Result Value Ref Range   Glucose-Capillary 262 (H) 70 - 99 mg/dL    Comment: Glucose reference range applies only to samples taken after fasting for at least 8 hours.  Glucose, capillary     Status: Abnormal   Collection Time: 12/01/20  6:37 AM  Result Value Ref Range   Glucose-Capillary 198 (H) 70 - 99 mg/dL    Comment: Glucose reference range applies only to samples taken after fasting for at least 8 hours.  Glucose, capillary     Status: Abnormal   Collection Time: 12/01/20  7:56 AM  Result Value Ref Range   Glucose-Capillary 147 (H) 70 - 99 mg/dL    Comment: Glucose reference range applies only to samples taken after fasting for at least 8 hours.  Glucose, capillary     Status: Abnormal   Collection Time: 12/01/20  8:59 AM  Result Value Ref Range   Glucose-Capillary 130 (H) 70 - 99 mg/dL    Comment: Glucose reference range applies only to samples taken after fasting for at least 8 hours.  Glucose, capillary     Status: Abnormal   Collection Time: 12/01/20 10:06 AM  Result Value Ref Range   Glucose-Capillary 137 (H) 70 - 99 mg/dL    Comment: Glucose reference range applies only to  samples taken after fasting for at least 8 hours.  Glucose, capillary     Status: Abnormal   Collection Time: 12/01/20 11:24 AM  Result Value Ref Range   Glucose-Capillary 115 (H) 70 - 99 mg/dL    Comment: Glucose reference range applies only to samples taken after fasting for at least 8 hours.  Glucose, capillary  Status: Abnormal   Collection Time: 12/01/20 12:35 PM  Result Value Ref Range   Glucose-Capillary 124 (H) 70 - 99 mg/dL    Comment: Glucose reference range applies only to samples taken after fasting for at least 8 hours.  Glucose, capillary     Status: Abnormal   Collection Time: 12/01/20  2:03 PM  Result Value Ref Range   Glucose-Capillary 183 (H) 70 - 99 mg/dL    Comment: Glucose reference range applies only to samples taken after fasting for at least 8 hours.  I-STAT 7, (LYTES, BLD GAS, ICA, H+H)     Status: Abnormal   Collection Time: 12/01/20  4:27 PM  Result Value Ref Range   pH, Arterial 7.369 7.350 - 7.450   pCO2 arterial 36.4 32.0 - 48.0 mmHg   pO2, Arterial 66 (L) 83.0 - 108.0 mmHg   Bicarbonate 21.0 20.0 - 28.0 mmol/L   TCO2 22 22 - 32 mmol/L   O2 Saturation 92.0 %   Acid-base deficit 4.0 (H) 0.0 - 2.0 mmol/L   Sodium 145 135 - 145 mmol/L   Potassium 3.6 3.5 - 5.1 mmol/L   Calcium, Ion 1.28 1.15 - 1.40 mmol/L   HCT 36.0 36.0 - 46.0 %   Hemoglobin 12.2 12.0 - 15.0 g/dL   Collection site art line    Drawn by Operator    Sample type ARTERIAL   Glucose, capillary     Status: Abnormal   Collection Time: 12/01/20  4:54 PM  Result Value Ref Range   Glucose-Capillary 284 (H) 70 - 99 mg/dL    Comment: Glucose reference range applies only to samples taken after fasting for at least 8 hours.  Glucose, capillary     Status: Abnormal   Collection Time: 12/01/20  6:29 PM  Result Value Ref Range   Glucose-Capillary 301 (H) 70 - 99 mg/dL    Comment: Glucose reference range applies only to samples taken after fasting for at least 8 hours.  Brain natriuretic  peptide     Status: None   Collection Time: 12/01/20  8:37 PM  Result Value Ref Range   B Natriuretic Peptide 97.0 0.0 - 100.0 pg/mL    Comment: Performed at [facility], [place] [address] [place], [place] [address]  Glucose, capillary     Status: Abnormal   Collection Time: 12/01/20  8:42 PM  Result Value Ref Range   Glucose-Capillary 281 (H) 70 - 99 mg/dL    Comment: Glucose reference range applies only to samples taken after fasting for at least 8 hours.  I-STAT 7, (LYTES, BLD GAS, ICA, H+H)     Status: Abnormal   Collection Time: 12/01/20 11:07 PM  Result Value Ref Range   pH, Arterial 7.125 (LL) 7.350 - 7.450   pCO2 arterial 71.1 (HH) 32.0 - 48.0 mmHg   pO2, Arterial 82 (L) 83.0 - 108.0 mmHg   Bicarbonate 23.4 20.0 - 28.0 mmol/L   TCO2 26 22 - 32 mmol/L   O2 Saturation 91.0 %   Acid-base deficit 7.0 (H) 0.0 - 2.0 mmol/L   Sodium 145 135 - 145 mmol/L   Potassium 3.6 3.5 - 5.1 mmol/L   Calcium, Ion 1.20 1.15 - 1.40 mmol/L   HCT 36.0 36.0 - 46.0 %   Hemoglobin 12.2 12.0 - 15.0 g/dL   Patient temperature 98.6 F    Collection site Radial    Drawn by VP    Sample type ARTERIAL    Comment NOTIFIED PHYSICIAN   Sodium  Status: Abnormal   Collection Time: 12/01/20 11:09 PM  Result Value Ref Range   Sodium 146 (H) 135 - 145 mmol/L    Comment: Performed at [facility], [place] [address] [place], [place] [address]  Glucose, capillary     Status: Abnormal   Collection Time: 12/01/20 11:17 PM  Result Value Ref Range   Glucose-Capillary 285 (H) 70 - 99 mg/dL    Comment: Glucose reference range applies only to samples taken after fasting for at least 8 hours.   *Note: Due to a large number of results and/or encounters for the requested time period, some results have not been displayed. A complete set of results can be found in Results Review.    CT Code Stroke CTA Head W/WO contrast  Result Date: 12/01/2020 CLINICAL DATA:  Initial evaluation for acute stroke. EXAM: CT  ANGIOGRAPHY HEAD AND NECK TECHNIQUE: Multidetector CT imaging of the head and neck was performed using the standard protocol during bolus administration of intravenous contrast. Multiplanar CT image reconstructions and MIPs were obtained to evaluate the vascular anatomy. Carotid stenosis measurements (when applicable) are obtained utilizing NASCET criteria, using the distal internal carotid diameter as the denominator. CONTRAST:  73mL OMNIPAQUE IOHEXOL 350 MG/ML SOLN COMPARISON:  Prior head CT from earlier the same day. FINDINGS: CTA NECK FINDINGS Aortic arch: Visualized aortic arch of normal caliber with normal 3 vessel morphology. Mild-to-moderate atheromatous change about the arch and origin of the great vessels without hemodynamically significant stenosis. Right carotid system: Right CCA patent from its origin to the bifurcation without stenosis. Bulky calcified plaque about the right bifurcation/proximal right ICA with associated stenosis of up to 30% by NASCET criteria. There is a focal hypodense filling defect within the proximal right ICA at this level, consistent with thrombus (series 8, image 121). Findings suspected to be related to an ulcerated and/or ruptured atheromatous plaque. This is likely the embolic source. Right ICA patent distally to the skull base without stenosis, dissection or occlusion. Left carotid system: Left CCA patent from its origin to the bifurcation without stenosis. Mild atheromatous plaque about the left bifurcation without significant stenosis. Left ICA patent distally without stenosis, dissection or occlusion. Vertebral arteries: Both vertebral arteries arise from subclavian arteries. No proximal subclavian artery stenosis. Vertebral arteries patent within the neck without stenosis, dissection or occlusion. Skeleton: No visible acute osseous abnormality. No discrete or worrisome osseous lesions. Prior fusion noted at C4-C7. Patient is largely edentulous. Other neck: No other  acute soft tissue abnormality within the neck. No mass or adenopathy. Upper chest: Extensive multifocal ground-glass opacity seen within the partially visualized lungs, concerning for infection/pneumonia. Review of the MIP images confirms the above findings CTA HEAD FINDINGS Anterior circulation: Petrous segments patent bilaterally. Atheromatous plaque within the carotid siphons with associated mild to moderate narrowing on the right and relatively mild narrowing on the left. There is abrupt occlusion at the right ICA terminus, likely embolic (series 7, image 451). Absent flow within the right M1 segment and distal right MCA branches, with relatively little if any collateralization within the right MCA distribution. A1 segments patent bilaterally. 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 Anterior cerebral arteries patent to their distal aspects without stenosis. Left M1 patent. Normal left MCA bifurcation. Distal left MCA branches well perfused. Posterior circulation: Both V4 segments patent to the vertebrobasilar junction without stenosis. Right vertebral dominant. Both PICA origins patent and normal. Basilar patent to its distal aspect without stenosis. Superior  cerebellar arteries patent bilaterally. Both PCAs primarily supplied via the basilar. PCAs remain well perfused to their distal aspects. Venous sinuses: Grossly patent allowing for timing the contrast bolus. Anatomic variants: None significant. Review of the MIP images confirms the above findings IMPRESSION: 1. 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 Absent flow within the right M1 segment and distal right MCA branches, with relatively little if any collateralization within the right MCA distribution. 2. Bulky calcified plaque about the right bifurcation/proximal right ICA with associated stenosis of up to 30% by NASCET criteria. Superimposed focal filling defect at the proximal right ICA  consistent with thrombus, likely due to an ulcerated and/or ruptured plaque. This is almost certainly the embolic source. 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 4. Extensive multifocal ground-glass opacity within the partially visualized lungs, consistent with history of COVID pneumonia. Critical Value/emergent results were called by telephone at the time of interpretation on 12/01/2020 at 12:24 am to provider ERIC [facility] , who verbally acknowledged these results. Electronically Signed   By: Jeannine Boga M.D.   On: 12/01/2020 01:28   CT HEAD WO CONTRAST  Result Date: 12/01/2020 CLINICAL DATA:  60 year old female with right MCA territory stroke. Follow-up exam. EXAM: CT HEAD WITHOUT CONTRAST TECHNIQUE: Contiguous axial images were obtained from the base of the skull through the vertex without intravenous contrast. COMPARISON:  Head CT dated 11/30/2020. FINDINGS: Brain: There has been interval development of a large area of low attenuation with loss of gray-white matter discrimination and effacement of the sulci involving the right frontal, temporal, parietal and occipital lobes in keeping with evolution of known right MCA territory infarct and edema. There is associated mass effect and compression of the right lateral ventricle. No acute intracranial hemorrhage. No midline shift. Vascular: Slightly higher attenuation of the right MCA. Skull: Normal. Negative for fracture or focal lesion. Sinuses/Orbits: Mucoperiosteal thickening of paranasal sinuses primarily involving the maxillary sinuses. The mastoid air cells are clear. Other: None IMPRESSION: Interval evolution of a large right MCA territory infarct with associated mass effect and compression of the right lateral ventricle. No acute intracranial hemorrhage. No midline shift. Continued follow-up recommended. Electronically Signed   By: Anner Crete M.D.   On: 12/01/2020 21:40   CT HEAD WO CONTRAST  Result Date: 11/30/2020 CLINICAL  DATA:  Mental status changes EXAM: CT HEAD WITHOUT CONTRAST TECHNIQUE: Contiguous axial images were obtained from the base of the skull through the vertex without intravenous contrast. COMPARISON:  12/31/2019 FINDINGS: Brain: Minor white matter microvascular ischemic changes about lateral ventricles throughout both cerebral hemispheres. No acute intracranial hemorrhage infarction, midline shift hydrocephalus, or extra-axial collection. No focal mass effect. Cisterns are patent. No cerebellar abnormality. Vascular: No hyperdense vessel or unexpected calcification. Skull: Normal. Negative for fracture or focal lesion. Sinuses/Orbits: Orbits are unremarkable. Chronic maxillary sinus disease bilaterally. Other: None. IMPRESSION: Minor chronic white matter microvascular changes. No acute intracranial abnormality by noncontrast CT. Electronically Signed   By: Jerilynn Mages.  Shick M.D.   On: 11/30/2020 13:48   CT Code Stroke CTA Neck W/WO contrast  Result Date: 12/01/2020 CLINICAL DATA:  Initial evaluation for acute stroke. EXAM: CT ANGIOGRAPHY HEAD AND NECK TECHNIQUE: Multidetector CT imaging of the head and neck was performed using the standard protocol during bolus administration of intravenous contrast. Multiplanar CT image reconstructions and MIPs were obtained to evaluate the vascular anatomy. Carotid stenosis measurements (when applicable) are obtained utilizing NASCET criteria, using the distal internal carotid diameter as the denominator. CONTRAST:  51mL OMNIPAQUE IOHEXOL 350 MG/ML SOLN COMPARISON:  Prior head CT from earlier the same day. FINDINGS: CTA NECK FINDINGS Aortic arch: Visualized aortic arch of normal caliber with normal 3 vessel morphology. Mild-to-moderate atheromatous change about the arch and origin of the great vessels without hemodynamically significant stenosis. Right carotid system: Right CCA patent from its origin to the bifurcation without stenosis. Bulky calcified plaque about the right  bifurcation/proximal right ICA with associated stenosis of up to 30% by NASCET criteria. There is a focal hypodense filling defect within the proximal right ICA at this level, consistent with thrombus (series 8, image 121). Findings suspected to be related to an ulcerated and/or ruptured atheromatous plaque. This is likely the embolic source. Right ICA patent distally to the skull base without stenosis, dissection or occlusion. Left carotid system: Left CCA patent from its origin to the bifurcation without stenosis. Mild atheromatous plaque about the left bifurcation without significant stenosis. Left ICA patent distally without stenosis, dissection or occlusion. Vertebral arteries: Both vertebral arteries arise from subclavian arteries. No proximal subclavian artery stenosis. Vertebral arteries patent within the neck without stenosis, dissection or occlusion. Skeleton: No visible acute osseous abnormality. No discrete or worrisome osseous lesions. Prior fusion noted at C4-C7. Patient is largely edentulous. Other neck: No other acute soft tissue abnormality within the neck. No mass or adenopathy. Upper chest: Extensive multifocal ground-glass opacity seen within the partially visualized lungs, concerning for infection/pneumonia. Review of the MIP images confirms the above findings CTA HEAD FINDINGS Anterior circulation: Petrous segments patent bilaterally. Atheromatous plaque within the carotid siphons with associated mild to moderate narrowing on the right and relatively mild narrowing on the left. There is abrupt occlusion at the right ICA terminus, likely embolic (series 7, image 451). Absent flow within the right M1 segment and distal right MCA branches, with relatively little if any collateralization within the right MCA distribution. A1 segments patent bilaterally. 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 Anterior cerebral arteries patent to their distal aspects without  stenosis. Left M1 patent. Normal left MCA bifurcation. Distal left MCA branches well perfused. Posterior circulation: Both V4 segments patent to the vertebrobasilar junction without stenosis. Right vertebral dominant. Both PICA origins patent and normal. Basilar patent to its distal aspect without stenosis. Superior cerebellar arteries patent bilaterally. Both PCAs primarily supplied via the basilar. PCAs remain well perfused to their distal aspects. Venous sinuses: Grossly patent allowing for timing the contrast bolus. Anatomic variants: None significant. Review of the MIP images confirms the above findings IMPRESSION: 1. 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 Absent flow within the right M1 segment and distal right MCA branches, with relatively little if any collateralization within the right MCA distribution. 2. Bulky calcified plaque about the right bifurcation/proximal right ICA with associated stenosis of up to 30% by NASCET criteria. Superimposed focal filling defect at the proximal right ICA consistent with thrombus, likely due to an ulcerated and/or ruptured plaque. This is almost certainly the embolic source. 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 4. Extensive multifocal ground-glass opacity within the partially visualized lungs, consistent with history of COVID pneumonia. Critical Value/emergent results were called by telephone at the time of interpretation on 12/01/2020 at 12:24 am to provider ERIC [facility] , who verbally acknowledged these results. Electronically Signed   By: Jeannine Boga M.D.   On: 12/01/2020 01:28   DG Chest Port 1 View  Result Date: 12/01/2020 CLINICAL DATA:  Acute respiratory failure  EXAM: PORTABLE CHEST 1 VIEW COMPARISON:  12/01/2020 FINDINGS: Single frontal view of the chest demonstrates interval retraction of the endotracheal tube, now approximately 3.5 cm above carina. Enteric catheter passes below diaphragm, tip excluded by collimation  but side port projecting over the gastric fundus. Cardiac silhouette is stable. Bibasilar airspace disease slightly more pronounced than previous. No effusion or pneumothorax. No acute bony abnormalities. IMPRESSION: 1. Support devices as above, with endotracheal tube now well above carina. 2. Progressive bibasilar airspace disease. Electronically Signed   By: Randa Ngo M.D.   On: 12/01/2020 22:08   Portable Chest x-ray  Result Date: 12/01/2020 CLINICAL DATA:  Hypoxia EXAM: PORTABLE CHEST 1 VIEW COMPARISON:  December 01, 2020 study obtained earlier in the day FINDINGS: Endotracheal tube tip is in the proximal right main bronchus. There is a nasogastric tube with tip in the proximal stomach. Side port is slightly superior to the gastroesophageal junction. No pneumothorax. There is atelectatic change in the left base with ill-defined opacity in the medial left base concerning for superimposed pneumonia. Heart is borderline enlarged with pulmonary vascularity normal. No adenopathy. There is postoperative change in the lower cervical region. IMPRESSION: 1. Endotracheal tube tip is in the proximal right main bronchus. Advise withdrawing endotracheal tube 4-4.5 cm. 2. Nasogastric tube side port is above the gastroesophageal junction. Advise advancing nasogastric tube 4-5 cm. 3. Bibasilar atelectasis with suspected superimposed pneumonia left base. 4.  Stable cardiac prominence. Critical Value/emergent results were called by telephone at the time of interpretation on 12/01/2020 at 1:42 pm to provider [facility] , who verbally acknowledged these results. Electronically Signed   By: Lowella Grip III M.D.   On: 12/01/2020 13:42   DG CHEST PORT 1 VIEW  Result Date: 12/01/2020 CLINICAL DATA:  Follow-up shortness of breath.  COVID. EXAM: PORTABLE CHEST 1 VIEW COMPARISON:  Chest x-ray 11/07/2020. FINDINGS: Heart size stable. Prominent multifocal bilateral pulmonary infiltrates are again noted. Similar findings  noted on prior exam. Small left pleural effusion. No pneumothorax. Prior cervical spine fusion. IMPRESSION: Prominent multifocal bilateral pulmonary infiltrates again noted. Similar findings noted on prior exam. Electronically Signed   By: Marcello Moores  Register   On: 12/01/2020 10:55   DG Abd Portable 1V  Result Date: 11/30/2020 CLINICAL DATA:  Abdominal pain, history of COVID-19 positivity EXAM: PORTABLE ABDOMEN - 1 VIEW COMPARISON:  10/08/2020 FINDINGS: Postsurgical changes are noted in the left femur and lumbar spine. Mild degenerative changes of lumbar spine are seen. Scattered large and small bowel gas is noted. No free air is seen. Changes of prior cholecystectomy are noted. IMPRESSION: No acute abnormality noted. Electronically Signed   By: Inez Catalina M.D.   On: 11/30/2020 09:57   ECHOCARDIOGRAM COMPLETE  Result Date: 12/01/2020    ECHOCARDIOGRAM REPORT   Patient Name:   CAMPBELLE BEKKER Date of Exam: 12/01/2020 Medical Rec #:  [id number]        Height:       58.0 in Accession #:    [id number]       Weight:       212.5 lb Date of Birth:  09-Jun-1961         BSA:          1.869 m Patient Age:    59 years         BP:           161/100 mmHg Patient Gender: F  HR:           129 bpm. Exam Location:  Inpatient Procedure: 2D Echo, Cardiac Doppler, Color Doppler and Intracardiac            Opacification Agent Indications:    Stroke 434.91 / I163.9  History:        Patient has prior history of Echocardiogram examinations, most                 recent 03/01/2011. CHF, COPD; Risk Factors:Hypertension,                 Diabetes, Dyslipidemia, Sleep Apnea and Current Smoker. Anemia.                 COVID-19.  Sonographer:    Darlina Sicilian RDCS Referring Phys: [id number] [facility] XU  Sonographer Comments: Technically difficult study due to poor echo windows. IMPRESSIONS  1. Left ventricular ejection fraction, by estimation, is 55%. The left ventricle has normal function. Left ventricular endocardial border not  optimally defined to evaluate regional wall motion. Indeterminate diastolic filling due to E-A fusion.  2. Right ventricular systolic function is normal. The right ventricular size is normal. There is moderately elevated pulmonary artery systolic pressure. The estimated right ventricular systolic pressure is [id number] mmHg.  3. The mitral valve is normal in structure. No evidence of mitral valve regurgitation. No evidence of mitral stenosis.  4. Tricuspid valve regurgitation is moderate.  5. The aortic valve is tricuspid. Aortic valve regurgitation is not visualized. Mild aortic valve sclerosis is present, with no evidence of aortic valve stenosis.  6. The inferior vena cava is normal in size with greater than 50% respiratory variability, suggesting right atrial pressure of 3 mmHg. FINDINGS  Left Ventricle: Left ventricular ejection fraction, by estimation, is 55%. The left ventricle has normal function. Left ventricular endocardial border not optimally defined to evaluate regional wall motion. The left ventricular internal cavity size was normal in size. There is no left ventricular hypertrophy. Indeterminate diastolic filling due to E-A fusion. Right Ventricle: The right ventricular size is normal. No increase in right ventricular wall thickness. Right ventricular systolic function is normal. There is moderately elevated pulmonary artery systolic pressure. The tricuspid regurgitant velocity is 3.26 m/s, and with an assumed right atrial pressure of 3 mmHg, the estimated right ventricular systolic pressure is [id number] mmHg. Left Atrium: Left atrial size was normal in size. Right Atrium: Right atrial size was normal in size. Pericardium: There is no evidence of pericardial effusion. Mitral Valve: The mitral valve is normal in structure. No evidence of mitral valve regurgitation. No evidence of mitral valve stenosis. Tricuspid Valve: The tricuspid valve is normal in structure. Tricuspid valve regurgitation is moderate. Aortic  Valve: The aortic valve is tricuspid. Aortic valve regurgitation is not visualized. Mild aortic valve sclerosis is present, with no evidence of aortic valve stenosis. Pulmonic Valve: The pulmonic valve was normal in structure. Pulmonic valve regurgitation is not visualized. Aorta: The aortic root is normal in size and structure. Venous: The inferior vena cava is normal in size with greater than 50% respiratory variability, suggesting right atrial pressure of 3 mmHg. IAS/Shunts: No atrial level shunt detected by color flow Doppler.  LEFT VENTRICLE PLAX 2D LVIDd:         4.50 cm LVIDs:         3.50 cm LV PW:         1.00 cm LV IVS:        0.80 cm LVOT diam:  1.90 cm LV SV:         43 LV SV Index:   23 LVOT Area:     2.84 cm  LV Volumes (MOD) LV vol d, MOD A2C: 46.9 ml LV vol d, MOD A4C: 67.9 ml LV vol s, MOD A2C: 21.6 ml LV vol s, MOD A4C: 17.8 ml LV SV MOD A2C:     25.3 ml LV SV MOD A4C:     67.9 ml LV SV MOD BP:      35.1 ml RIGHT VENTRICLE RV S prime:     9.79 cm/s LEFT ATRIUM             Index LA diam:        3.60 cm 1.93 cm/m LA Vol (A2C):   26.2 ml 14.02 ml/m LA Vol (A4C):   36.5 ml 19.53 ml/m LA Biplane Vol: 32.2 ml 17.23 ml/m  AORTIC VALVE LVOT Vmax:   96.90 cm/s LVOT Vmean:  64.800 cm/s LVOT VTI:    0.152 m  AORTA Ao Root diam: 2.90 cm Ao Asc diam:  3.20 cm TRICUSPID VALVE TR Peak grad:   42.5 mmHg TR Vmax:        326.00 cm/s  SHUNTS Systemic VTI:  0.15 m Systemic Diam: 1.90 cm Loralie Champagne MD Electronically signed by Loralie Champagne MD Signature Date/Time: 12/01/2020/6:39:48 PM    Final    CT HEAD CODE STROKE WO CONTRAST  Result Date: 12/01/2020 CLINICAL DATA:  Code stroke. Initial evaluation for acute aphasia, left-sided weakness and lethargy. EXAM: CT HEAD WITHOUT CONTRAST TECHNIQUE: Contiguous axial images were obtained from the base of the skull through the vertex without intravenous contrast. COMPARISON:  Prior head CT from 11/30/2020 FINDINGS: Brain: Examination technically limited by motion  artifact. No definite intracranial hemorrhage. There is subtle fogging with loss of cortical sulcation involving a large portion of the right MCA distribution, with involvement of the right insula and adjacent right temporal occipital region, as well as the overlying supra ganglionic cerebral gray matter. Relative sparing of the right basal ganglia and internal capsule. No other acute large vessel territory infarct. No mass lesion, midline shift, or mass effect. No hydrocephalus. No definite extra-axial fluid collection. Vascular: Question focal hyperdensity at the right ICA terminus, which could reflect thrombus (series 2, image 16). Skull: Scalp soft tissues within normal limits. Calvarium grossly intact. Sinuses/Orbits: Globes and orbital soft tissues grossly within normal limits. Right gaze noted. Moderate mucosal thickening noted within the right maxillary sinus, chronic in appearance. Mastoid air cells are clear. Other: None. ASPECTS [facility] Stroke Program Early CT Score) - Ganglionic level infarction (caudate, lentiform nuclei, internal capsule, insula, M1-M3 cortex): 4 - Supraganglionic infarction (M4-M6 cortex): 1 Total score (0-10 with 10 being normal): 5 IMPRESSION: 1. Subtle loss of gray-white matter differentiation and cortical sulcation involving the right MCA distribution as above, concerning for evolving acute right MCA territory infarct. No acute intracranial hemorrhage. 2. ASPECTS is 5. Critical Value/emergent results were called by telephone at the time of interpretation on 12/01/2020 at 12:06 am to provider Dr. Cheral Marker, who verbally acknowledged these results. Electronically Signed   By: Jeannine Boga M.D.   On: 12/01/2020 00:25   VAS [place] LOWER EXTREMITY VENOUS (DVT)  Result Date: 12/01/2020  Lower Venous DVT Study Indications: Stroke.  Comparison Study: no prior Performing Technologist: Abram Sander RVS  Examination Guidelines: A complete evaluation includes B-mode imaging, spectral  Doppler, color Doppler, and power Doppler as needed of all accessible portions of each vessel.  Bilateral testing is considered an integral part of a complete examination. Limited examinations for reoccurring indications may be performed as noted. The reflux portion of the exam is performed with the patient in reverse Trendelenburg.  +---------+---------------+---------+-----------+----------+-------------------+ RIGHT    CompressibilityPhasicitySpontaneityPropertiesThrombus Aging      +---------+---------------+---------+-----------+----------+-------------------+ CFV      Full           Yes      Yes                                      +---------+---------------+---------+-----------+----------+-------------------+ SFJ      Full                                                             +---------+---------------+---------+-----------+----------+-------------------+ FV Prox  Full                                                             +---------+---------------+---------+-----------+----------+-------------------+ FV Mid   Full                                                             +---------+---------------+---------+-----------+----------+-------------------+ FV DistalFull                                                             +---------+---------------+---------+-----------+----------+-------------------+ PFV      Full                                                             +---------+---------------+---------+-----------+----------+-------------------+ POP      Full           Yes      Yes                                      +---------+---------------+---------+-----------+----------+-------------------+ PTV      Full                                                             +---------+---------------+---------+-----------+----------+-------------------+ PERO  Not well  visualized +---------+---------------+---------+-----------+----------+-------------------+   +---------+---------------+---------+-----------+----------+-------------------+ LEFT     CompressibilityPhasicitySpontaneityPropertiesThrombus Aging      +---------+---------------+---------+-----------+----------+-------------------+ CFV      Full           Yes      Yes                                      +---------+---------------+---------+-----------+----------+-------------------+ SFJ      Full                                                             +---------+---------------+---------+-----------+----------+-------------------+ FV Prox  Full                                                             +---------+---------------+---------+-----------+----------+-------------------+ FV Mid                  Yes      Yes                                      +---------+---------------+---------+-----------+----------+-------------------+ FV Distal               Yes      Yes                                      +---------+---------------+---------+-----------+----------+-------------------+ PFV      Full                                                             +---------+---------------+---------+-----------+----------+-------------------+ POP      Full           Yes      Yes                                      +---------+---------------+---------+-----------+----------+-------------------+ PTV      Full                                                             +---------+---------------+---------+-----------+----------+-------------------+ PERO                                                  Not well visualized +---------+---------------+---------+-----------+----------+-------------------+     Summary: BILATERAL: -  No evidence of deep vein thrombosis seen in the lower extremities, bilaterally. - No evidence of superficial venous thrombosis  in the lower extremities, bilaterally. -No evidence of popliteal cyst, bilaterally.   *See table(s) above for measurements and observations. Electronically signed by Servando Snare MD on 12/01/2020 at 4:43:34 PM.    Final     Review of Systems  Unable to perform ROS: Intubated   Blood pressure 109/69, pulse (!) 128, temperature 99.4 F (37.4 C), temperature source Oral, resp. rate (!) 42, height 4\' 10"  (1.473 m), weight 96.4 kg, SpO2 (!) 86 %. Physical Exam Neurological:     Mental Status: She is unresponsive.     Cranial Nerves: Cranial nerve deficit present.     Motor: Weakness present.     Comments: Right pupil dilated 74mm fixed, left pupil 67mm not reactive No corneal reflex No oculocephalics No cough No gag Is not overbreathing ventilator No response to peripheral or central noxious stimuli Intubated, sedated, no paralytics     Assessment/Plan: I do not believe that a hemicraniectomy for presumptive intracranial hypertension would be of any benefit to the patient. Her prognosis is grave. She had no evidence brainstem function on my exam. While sedation was on board, the brainstem reflexes are not completely suppressed. No brainstem reflex was identified. I have explained my rationale to Sandra Ferrell' son and he understands.   Ashok Pall 12-26-2020, 12:04 AM

## 2020-12-06 NOTE — Progress Notes (Signed)
[facility] Progress Note Patient Name: Sandra Ferrell DOB: [birth year] MRN: [id number]   Date of Service  11/21/2020  HPI/Events of Note  I went into the room at the request of the [place] family, I spoke with Mr. Sandra Ferrell and his two children. I explained the catastrophic nature of her brain injury and the abysmal prognosis for meaningful recovery, the son is leaning towards withdrawing care but his father is  Undecided, I encouraged them to discuss further and let me know if and when they make a decision, I let them know I was available to answer further questions if need be.  eICU Interventions  See above.        [name] Ogan 11/06/2020, 1:43 AM

## 2020-12-06 NOTE — Progress Notes (Signed)
Pt. w/ active DNR and comfort care orders expired December 24, 2020 @ 1332. Death confirmed by Thersa Salt, RN and Meda Klinefelter, RN. Family and attending MD notified.

## 2020-12-06 NOTE — Progress Notes (Signed)
     Referral received for [name] :goals of care discussion/EO. Chart reviewed. 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 She is currently comfort measures only with continuous fentanyl drip.   No further Palliative needs identified.   Thank you for your referral to assist in Mrs. Sandra Ferrell's care. If further assistance is needed please do not hesitate to contact our team.   Alda Lea, AGPCNP-BC Palliative Medicine Team  Phone: [phone] Pager: [phone] Amion: [place]

## 2020-12-06 NOTE — Progress Notes (Signed)
This chaplain responded to the consult for EOL spiritual care.  The chaplain checked in with the RN-Nate before the visit.  The chaplain understands the family will not visit today.    Upon arrival to the unit the chaplain learned the Pt. passed 5 minutes ago.  The chaplain shared sacred scripture and prayer with the Pt. outside the room.  This chaplain is available for F/U spiritual care as needed.

## 2020-12-06 NOTE — Progress Notes (Signed)
1930- pt dyssynchronous on ventilator with sats in 80s. RN gave prn sedation bolus and started another sedative. Pt was RASS -4 with pupils 2 mm, round and sluggish bilaterally. Pt only moved to pain on her right side. At 2015 Paged Dr. Lucile Shutters d/t decrease BP and further ventilator dyssynchrony. Neo was started and vecuronium IVP given once.  2030- RN reassessed pupils and found R pupil 13mm and sluggish and L pupil 2 mm and sluggish. Notified Dr. Lucile Shutters and Dr. Cheral Marker, neurology. Per Dr. Lucile Shutters  will order STAT head CT.  2150- Pt back from CT. Saturations sustaining in low 80s. Notified Dr. Lucile Shutters. Per MD for RT to adjust vent settings, get CXR ,and check ABG within an hour.   2211- RN reassessed pt neuro status and found R pupil further dilated to 6 mm and nonreactive. L pupil remains unchanged. Dr. Lucile Shutters and Dr. Cheral Marker both notified. Per Dr. Cheral Marker will start 3% NS infusion and will speak with Dr.Ogan about neurosurgery consult.   2320- Dr. Christella Noa, Neurosurgery at bedside assessing pt. MD also spoke with pt's son, [name]  86- Pt's husband and kids at bedside d/t pt's circumstance. Dr. Lucile Shutters notified to camera into room for family's questions about goals of care. Dr. Lucile Shutters did camera in and have discussion with family.  0200- Pt's family wants to make pt DNR and start comfort measures. Dr. Lucile Shutters on camera educating family about what comfort measures will be taken place. RN reassured family about comfort care measures and to keep them updated.   5625- Pt extubated and comfort measures are in place. Will continue to monitor.

## 2020-12-06 DEATH — deceased

## 2021-02-15 ENCOUNTER — Other Ambulatory Visit: Payer: Self-pay

## 2021-02-16 NOTE — Patient Outreach (Signed)
[facility]) Care Management  02/16/2021  Sandra Ferrell [birth year] [id number]   No attempts were made as patient is Deceased as of 12/15/2020. mRs=6    [place] Management Assistant [phone]

## 2021-09-14 IMAGING — CT CT HEAD CODE STROKE
2 of 7 series · 12 of 47 positions shown, 15 images · non-contrast
Comparison: Prior head CT from 11/30/2020

CLINICAL DATA: Code stroke. Initial evaluation for acute aphasia,
left-sided weakness and lethargy.

EXAM:
CT HEAD WITHOUT CONTRAST
TECHNIQUE: Contiguous axial images were obtained from the base of the skull
through the vertex without intravenous contrast.

[Series 2: head 5.0 h30s · axial · 0.41mm/px · z∈[-157,-17]mm · 9 of 34 slices shown, 12 images]
[im 3/34  brain]
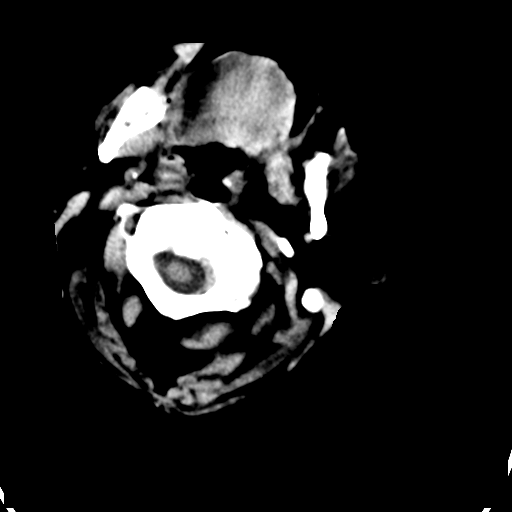
[im 3/34  bone]
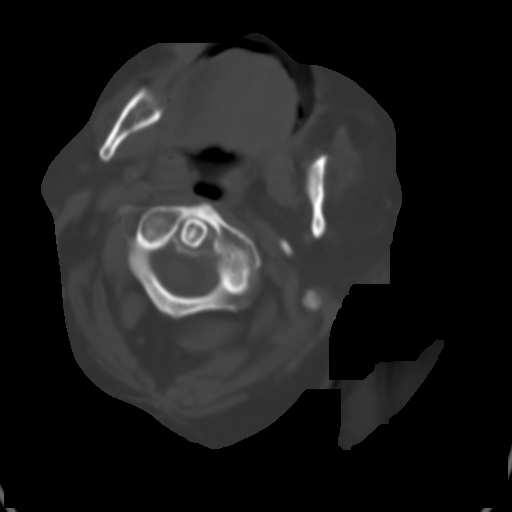
[im 6/34  brain]
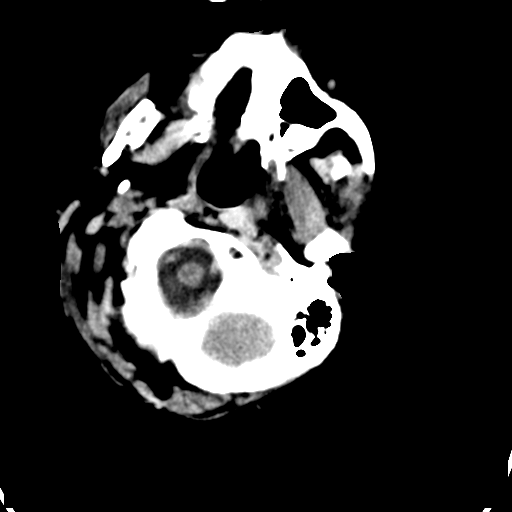
[im 12/34  brain]
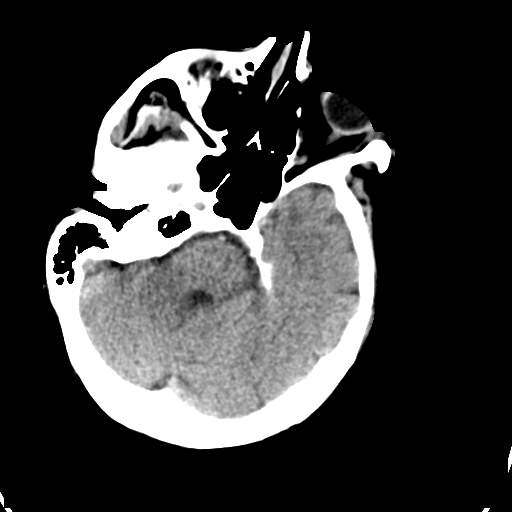
[im 14/34  brain]
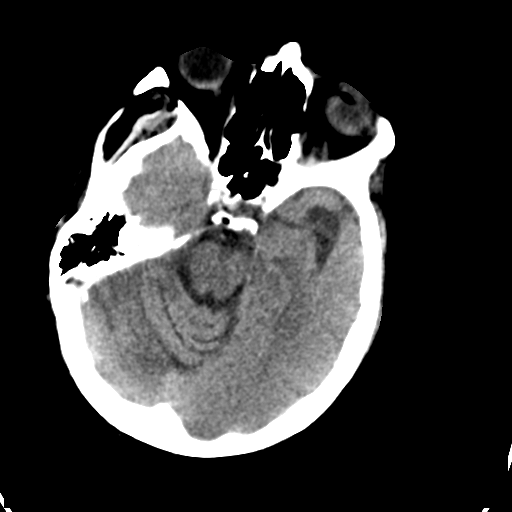
[im 17/34  brain]
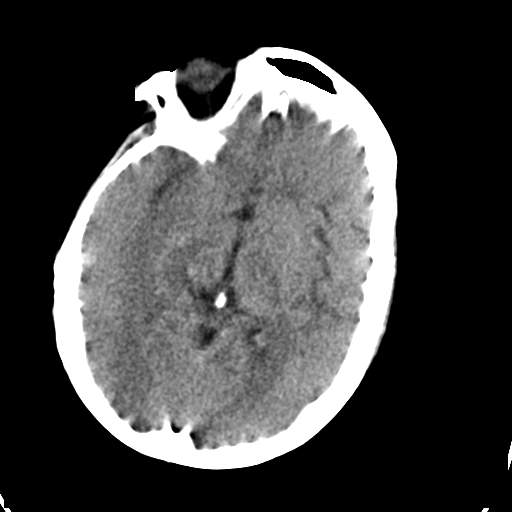
[im 17/34  bone]
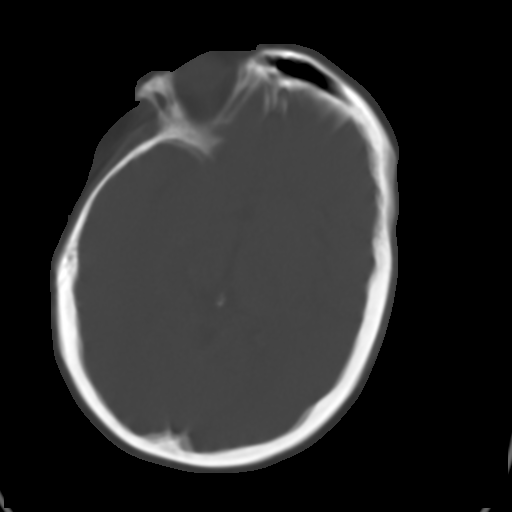
[im 20/34  brain]
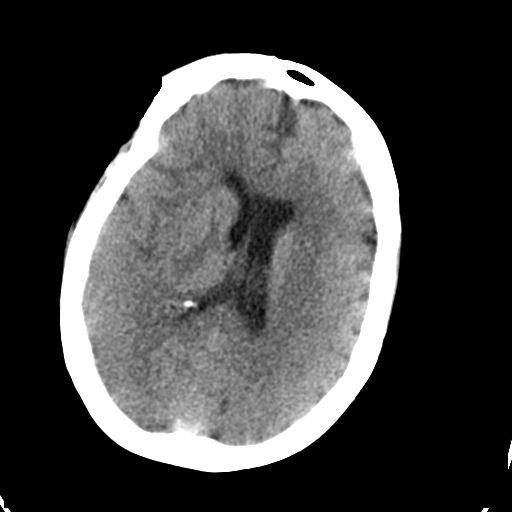
[im 23/34  brain]
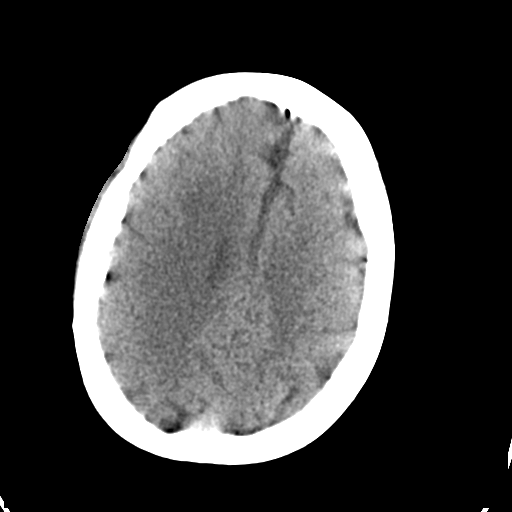
[im 28/34  brain]
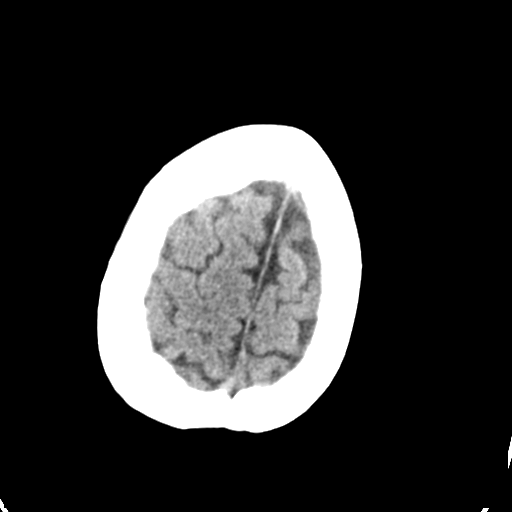
[im 31/34  brain]
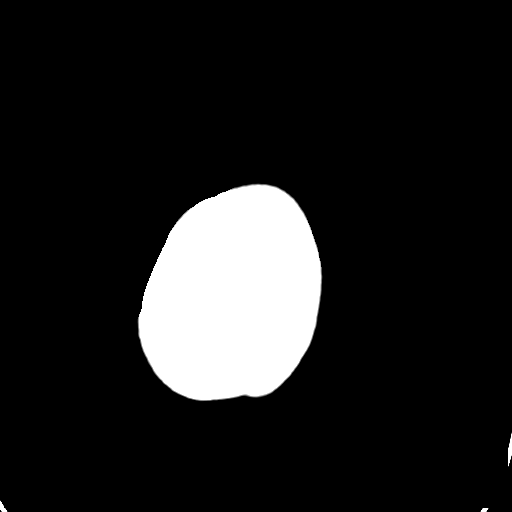
[im 31/34  bone]
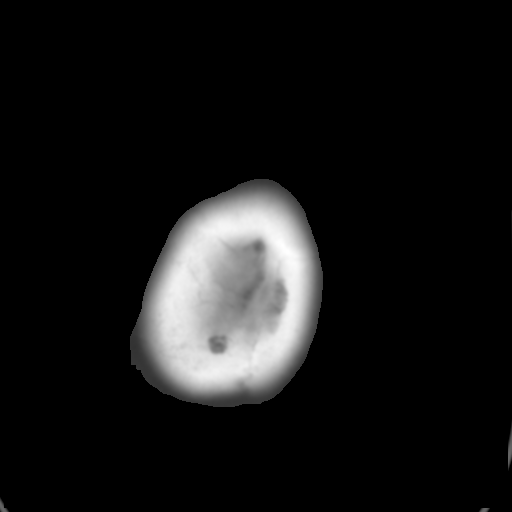

[Series 8: head 3.0 mpr cor · coronal · 0.14mm/px · 3 of 67 slices shown]
[im 17/67  brain]
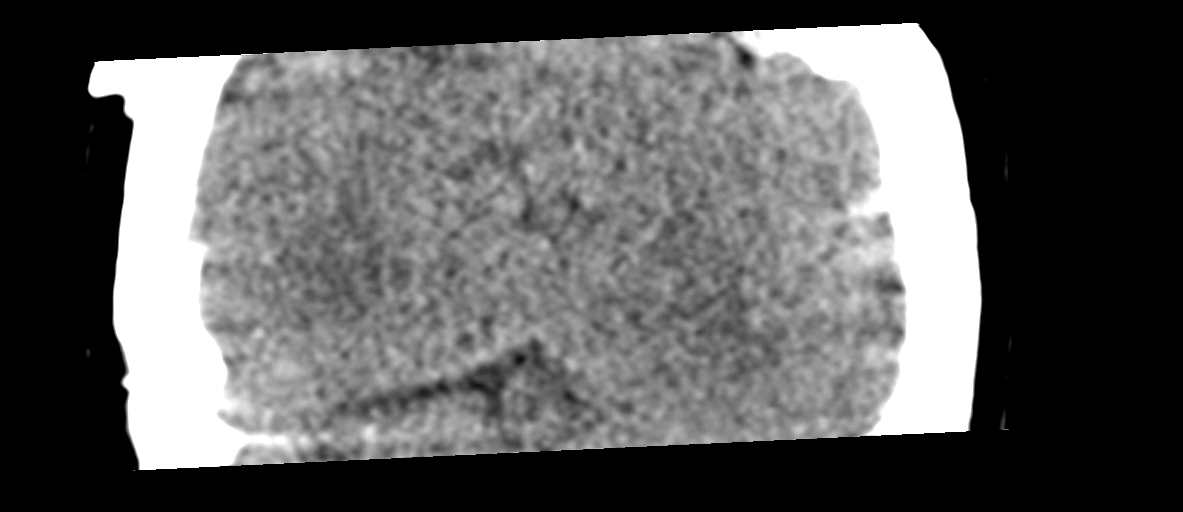
[im 34/67  brain]
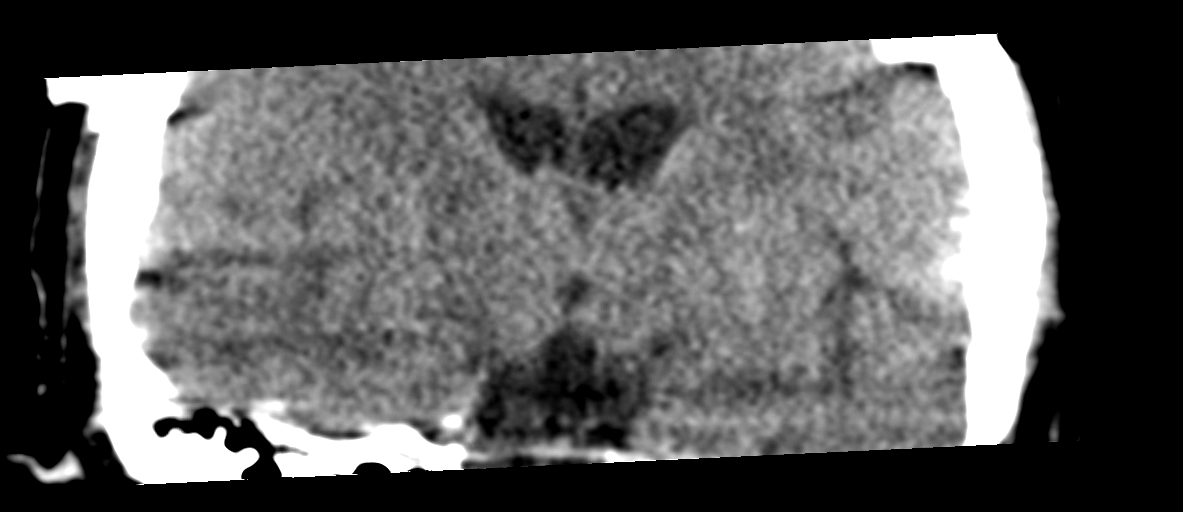
[im 50/67  brain]
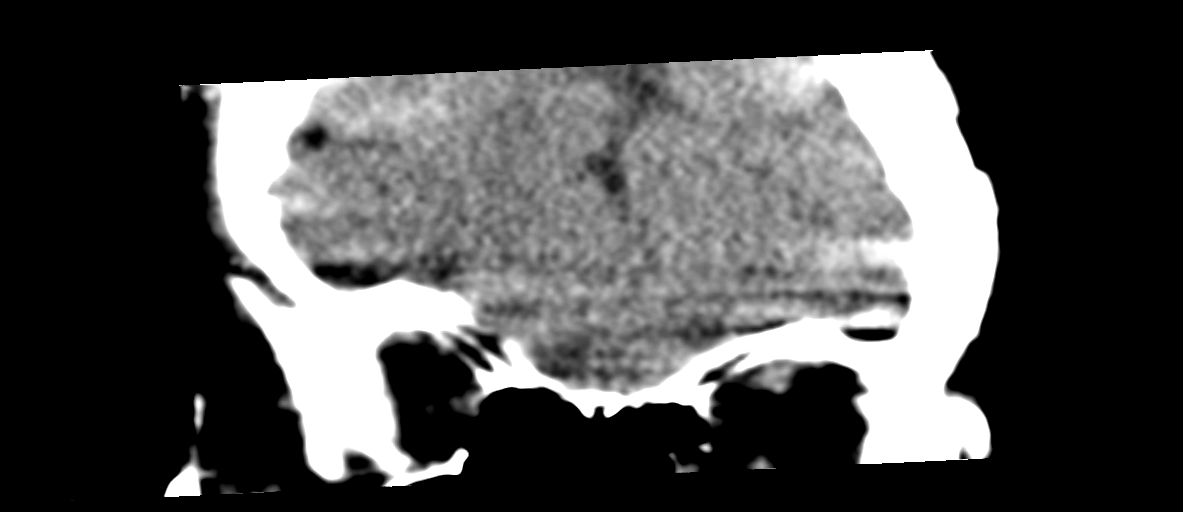

[12 of 47 positions shown; findings below may reference images not displayed]

FINDINGS: Brain: Examination technically limited by motion artifact. No
definite intracranial hemorrhage.

There is subtle fogging with loss of cortical sulcation involving a
large portion of the right MCA distribution, with involvement of the
right insula and adjacent right temporal occipital region, as well
as the overlying supra ganglionic cerebral gray matter. Relative
sparing of the right basal ganglia and internal capsule. No other
acute large vessel territory infarct. No mass lesion, midline shift,
or mass effect. No hydrocephalus. No definite extra-axial fluid
collection.

Vascular: Question focal hyperdensity at the right ICA terminus,
which could reflect thrombus (series 2, image 16).

Skull: Scalp soft tissues within normal limits. Calvarium grossly
intact.

Sinuses/Orbits: Globes and orbital soft tissues grossly within
normal limits. Right gaze noted. Moderate mucosal thickening noted
within the right maxillary sinus, chronic in appearance. Mastoid air
cells are clear.

Other: None.

ASPECTS (Alberta Stroke Program Early CT Score)

- Ganglionic level infarction (caudate, lentiform nuclei, internal
capsule, insula, M1-M3 cortex): 4

- Supraganglionic infarction (M4-M6 cortex): 1

Total score (0-10 with 10 being normal): 5
IMPRESSION: 1. Subtle loss of gray-white matter differentiation and cortical
sulcation involving the right MCA distribution as above, concerning
for evolving acute right MCA territory infarct. No acute
intracranial hemorrhage.
2. ASPECTS is 5.

Critical Value/emergent results were called by telephone at the time
of interpretation on 12/01/2020 at [DATE] to provider Dr. Rhiannon,
who verbally acknowledged these results.

## 2021-09-15 IMAGING — DX DG CHEST 1V PORT
1 series · 1 of 1 positions shown · non-contrast
Comparison: December 01, 2020 study obtained earlier in the day

CLINICAL DATA: Hypoxia

EXAM:
PORTABLE CHEST 1 VIEW

[chest]
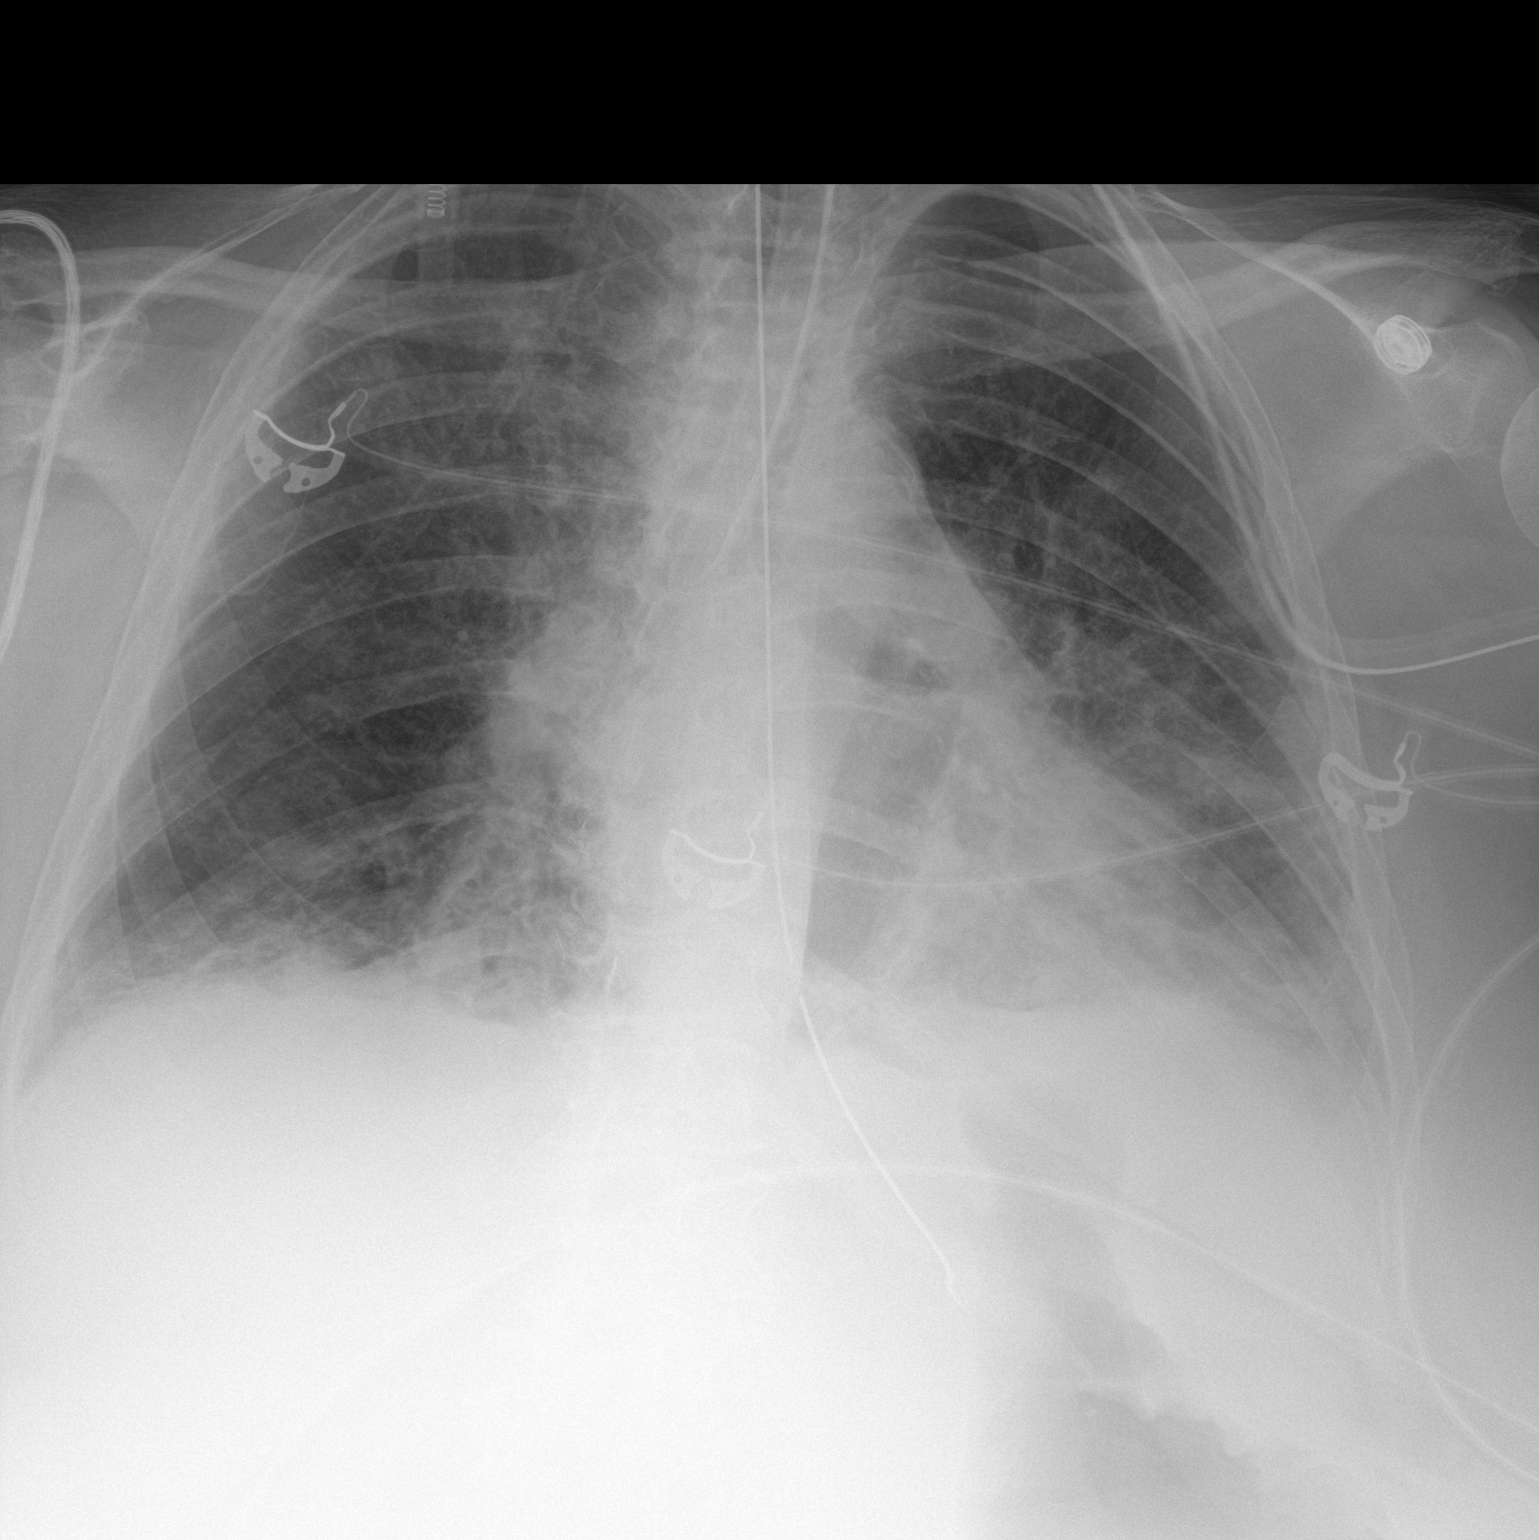

[1 of 1 positions shown; findings below may reference images not displayed]

FINDINGS: Endotracheal tube tip is in the proximal right main bronchus. There
is a nasogastric tube with tip in the proximal stomach. Side port is
slightly superior to the gastroesophageal junction. No pneumothorax.
There is atelectatic change in the left base with ill-defined
opacity in the medial left base concerning for superimposed
pneumonia. Heart is borderline enlarged with pulmonary vascularity
normal. No adenopathy. There is postoperative change in the lower
cervical region.
IMPRESSION: 1. Endotracheal tube tip is in the proximal right main bronchus.
Advise withdrawing endotracheal tube 4-4.5 cm.

2. Nasogastric tube side port is above the gastroesophageal
junction. Advise advancing nasogastric tube 4-5 cm.

3. Bibasilar atelectasis with suspected superimposed pneumonia left
base.

4.  Stable cardiac prominence.

Critical Value/emergent results were called by telephone at the time
of interpretation on 12/01/2020 at [DATE] to provider HUGUETTE AO ,
who verbally acknowledged these results.

## 2021-09-15 IMAGING — DX DG CHEST 1V PORT
1 series · 1 of 1 positions shown · non-contrast
Comparison: 12/01/2020

CLINICAL DATA: Acute respiratory failure

EXAM:
PORTABLE CHEST 1 VIEW

[chest ap]
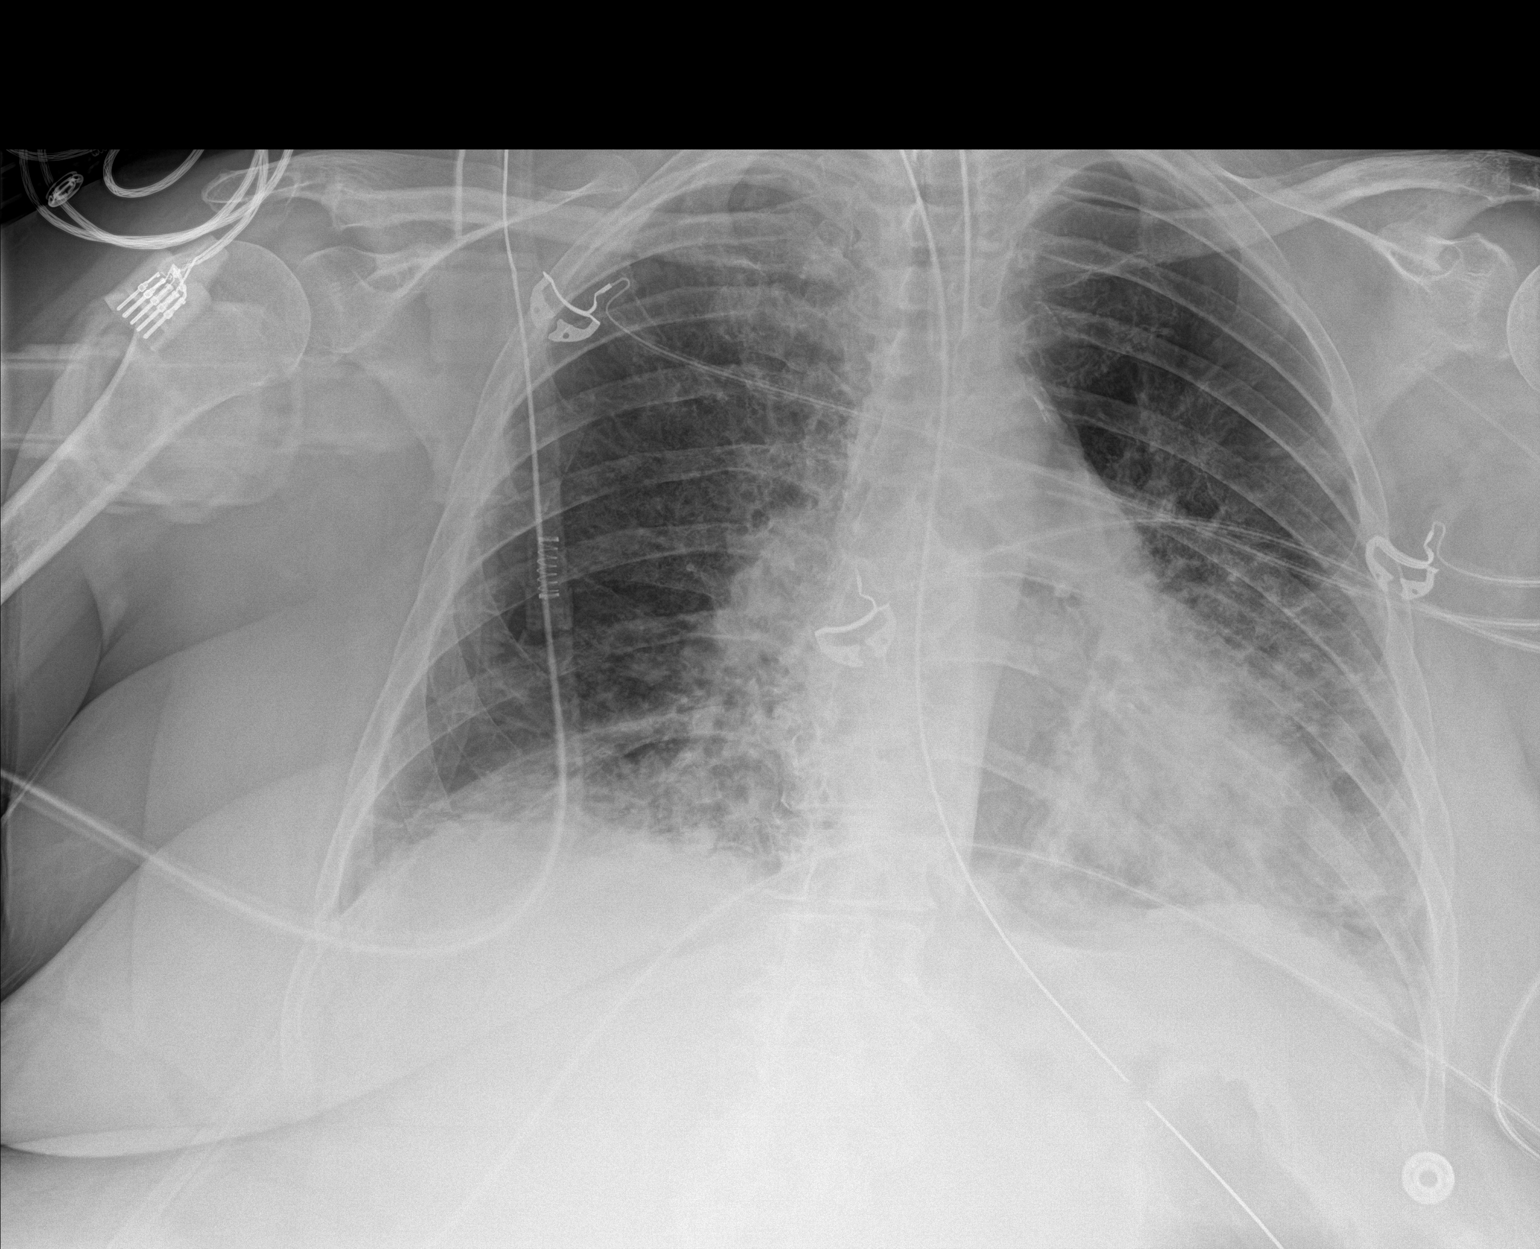

[1 of 1 positions shown; findings below may reference images not displayed]

FINDINGS: Single frontal view of the chest demonstrates interval retraction of
the endotracheal tube, now approximately 3.5 cm above carina.
Enteric catheter passes below diaphragm, tip excluded by collimation
but side port projecting over the gastric fundus. Cardiac silhouette
is stable. Bibasilar airspace disease slightly more pronounced than
previous. No effusion or pneumothorax. No acute bony abnormalities.
IMPRESSION: 1. Support devices as above, with endotracheal tube now well above
carina.
2. Progressive bibasilar airspace disease.

## 2021-09-15 IMAGING — DX DG CHEST 1V PORT
1 series · 1 of 1 positions shown · non-contrast
Comparison: Chest x-ray 11/29/2020.

CLINICAL DATA: Follow-up shortness of breath.  COVID.

EXAM:
PORTABLE CHEST 1 VIEW

[chest ap]
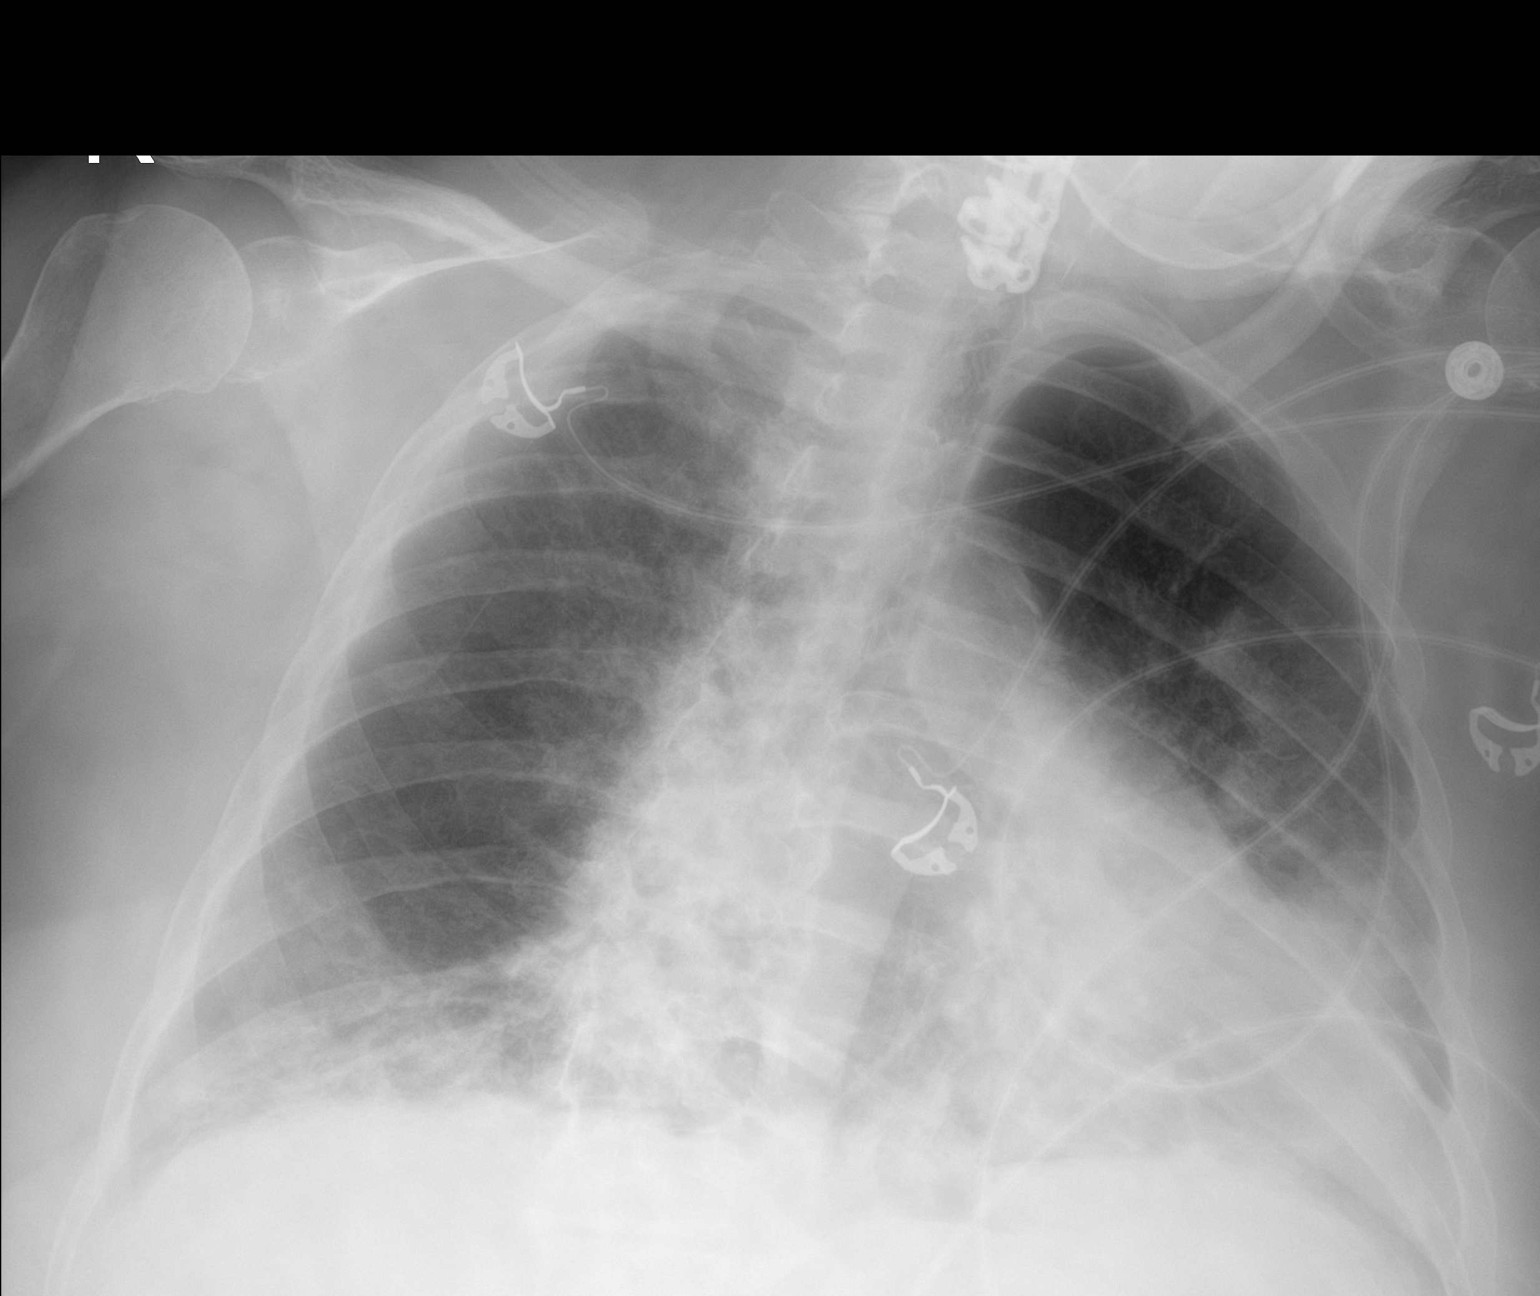

[1 of 1 positions shown; findings below may reference images not displayed]

FINDINGS: Heart size stable. Prominent multifocal bilateral pulmonary
infiltrates are again noted. Similar findings noted on prior exam.
Small left pleural effusion. No pneumothorax. Prior cervical spine
fusion.
IMPRESSION: Prominent multifocal bilateral pulmonary infiltrates again noted.
Similar findings noted on prior exam.
